# Patient Record
Sex: Female | Born: 1935 | Race: Black or African American | Hispanic: No | Marital: Married | Smoking: Former smoker
Health system: Southern US, Community
[De-identification: ages and names within clinical notes are randomized; demographics above are authoritative.]

## PROBLEM LIST (undated history)

## (undated) DIAGNOSIS — J449 Chronic obstructive pulmonary disease, unspecified: Secondary | ICD-10-CM

## (undated) DIAGNOSIS — N763 Subacute and chronic vulvitis: Secondary | ICD-10-CM

## (undated) DIAGNOSIS — D649 Anemia, unspecified: Secondary | ICD-10-CM

## (undated) DIAGNOSIS — K922 Gastrointestinal hemorrhage, unspecified: Secondary | ICD-10-CM

## (undated) DIAGNOSIS — K219 Gastro-esophageal reflux disease without esophagitis: Secondary | ICD-10-CM

## (undated) DIAGNOSIS — I4892 Unspecified atrial flutter: Principal | ICD-10-CM

## (undated) DIAGNOSIS — I2510 Atherosclerotic heart disease of native coronary artery without angina pectoris: Secondary | ICD-10-CM

## (undated) DIAGNOSIS — E119 Type 2 diabetes mellitus without complications: Secondary | ICD-10-CM

## (undated) DIAGNOSIS — M1990 Unspecified osteoarthritis, unspecified site: Secondary | ICD-10-CM

## (undated) DIAGNOSIS — E785 Hyperlipidemia, unspecified: Secondary | ICD-10-CM

## (undated) DIAGNOSIS — I340 Nonrheumatic mitral (valve) insufficiency: Secondary | ICD-10-CM

## (undated) DIAGNOSIS — I639 Cerebral infarction, unspecified: Secondary | ICD-10-CM

## (undated) DIAGNOSIS — Z5189 Encounter for other specified aftercare: Secondary | ICD-10-CM

## (undated) DIAGNOSIS — E1140 Type 2 diabetes mellitus with diabetic neuropathy, unspecified: Secondary | ICD-10-CM

## (undated) DIAGNOSIS — E039 Hypothyroidism, unspecified: Secondary | ICD-10-CM

## (undated) DIAGNOSIS — R413 Other amnesia: Secondary | ICD-10-CM

## (undated) DIAGNOSIS — I5042 Chronic combined systolic (congestive) and diastolic (congestive) heart failure: Secondary | ICD-10-CM

## (undated) DIAGNOSIS — I10 Essential (primary) hypertension: Secondary | ICD-10-CM

## (undated) DIAGNOSIS — I071 Rheumatic tricuspid insufficiency: Secondary | ICD-10-CM

## (undated) DIAGNOSIS — Z950 Presence of cardiac pacemaker: Secondary | ICD-10-CM

## (undated) DIAGNOSIS — I4891 Unspecified atrial fibrillation: Secondary | ICD-10-CM

## (undated) HISTORY — DX: Atherosclerotic heart disease of native coronary artery without angina pectoris: I25.10

## (undated) HISTORY — PX: ABDOMINAL HYSTERECTOMY: SHX81

## (undated) HISTORY — DX: Hypothyroidism, unspecified: E03.9

## (undated) HISTORY — PX: TONSILLECTOMY: SUR1361

## (undated) HISTORY — DX: Encounter for other specified aftercare: Z51.89

## (undated) HISTORY — DX: Subacute and chronic vulvitis: N76.3

## (undated) HISTORY — DX: Unspecified atrial fibrillation: I48.91

## (undated) HISTORY — DX: Hyperlipidemia, unspecified: E78.5

## (undated) HISTORY — DX: Chronic obstructive pulmonary disease, unspecified: J44.9

## (undated) HISTORY — DX: Essential (primary) hypertension: I10

## (undated) HISTORY — DX: Anemia, unspecified: D64.9

## (undated) HISTORY — DX: Type 2 diabetes mellitus without complications: E11.9

## (undated) HISTORY — PX: JOINT REPLACEMENT: SHX530

## (undated) HISTORY — DX: Cerebral infarction, unspecified: I63.9

## (undated) HISTORY — PX: BREAST BIOPSY: SHX20

## (undated) HISTORY — DX: Type 2 diabetes mellitus with diabetic neuropathy, unspecified: E11.40

## (undated) SURGERY — Surgical Case
Anesthesia: *Unknown

---

## 1977-07-04 HISTORY — PX: TOTAL ABDOMINAL HYSTERECTOMY: SHX209

## 1993-07-04 DIAGNOSIS — N763 Subacute and chronic vulvitis: Secondary | ICD-10-CM

## 1993-07-04 HISTORY — DX: Subacute and chronic vulvitis: N76.3

## 1998-02-10 ENCOUNTER — Ambulatory Visit (HOSPITAL_COMMUNITY): Admission: RE | Admit: 1998-02-10 | Discharge: 1998-02-10 | Payer: Self-pay | Admitting: Gastroenterology

## 1998-10-01 ENCOUNTER — Ambulatory Visit (HOSPITAL_COMMUNITY): Admission: RE | Admit: 1998-10-01 | Discharge: 1998-10-01 | Payer: Self-pay | Admitting: Cardiology

## 1998-10-01 ENCOUNTER — Encounter: Payer: Self-pay | Admitting: Cardiology

## 2000-09-11 ENCOUNTER — Other Ambulatory Visit: Admission: RE | Admit: 2000-09-11 | Discharge: 2000-09-11 | Payer: Self-pay | Admitting: *Deleted

## 2000-10-02 HISTORY — PX: BILATERAL SALPINGECTOMY: SHX5743

## 2000-10-06 ENCOUNTER — Encounter: Payer: Self-pay | Admitting: Obstetrics and Gynecology

## 2000-10-10 ENCOUNTER — Inpatient Hospital Stay (HOSPITAL_COMMUNITY): Admission: RE | Admit: 2000-10-10 | Discharge: 2000-10-12 | Payer: Self-pay | Admitting: Obstetrics and Gynecology

## 2000-10-10 ENCOUNTER — Encounter (INDEPENDENT_AMBULATORY_CARE_PROVIDER_SITE_OTHER): Payer: Self-pay

## 2001-07-20 ENCOUNTER — Encounter: Admission: RE | Admit: 2001-07-20 | Discharge: 2001-07-20 | Payer: Self-pay | Admitting: Orthopedic Surgery

## 2001-07-20 ENCOUNTER — Encounter: Payer: Self-pay | Admitting: Orthopedic Surgery

## 2001-08-04 HISTORY — PX: KNEE ARTHROSCOPY: SUR90

## 2001-08-10 ENCOUNTER — Ambulatory Visit (HOSPITAL_BASED_OUTPATIENT_CLINIC_OR_DEPARTMENT_OTHER): Admission: RE | Admit: 2001-08-10 | Discharge: 2001-08-10 | Payer: Self-pay | Admitting: Orthopedic Surgery

## 2002-01-09 ENCOUNTER — Ambulatory Visit (HOSPITAL_COMMUNITY): Admission: RE | Admit: 2002-01-09 | Discharge: 2002-01-09 | Payer: Self-pay | Admitting: Orthopedic Surgery

## 2002-07-04 DIAGNOSIS — I639 Cerebral infarction, unspecified: Secondary | ICD-10-CM

## 2002-07-04 HISTORY — DX: Cerebral infarction, unspecified: I63.9

## 2003-02-25 ENCOUNTER — Ambulatory Visit (HOSPITAL_COMMUNITY): Admission: RE | Admit: 2003-02-25 | Discharge: 2003-02-25 | Payer: Self-pay | Admitting: Gastroenterology

## 2003-04-09 ENCOUNTER — Encounter: Payer: Self-pay | Admitting: Emergency Medicine

## 2003-04-09 ENCOUNTER — Inpatient Hospital Stay (HOSPITAL_COMMUNITY): Admission: EM | Admit: 2003-04-09 | Discharge: 2003-04-11 | Payer: Self-pay | Admitting: Emergency Medicine

## 2003-04-10 ENCOUNTER — Encounter: Payer: Self-pay | Admitting: Internal Medicine

## 2003-04-30 ENCOUNTER — Encounter: Admission: RE | Admit: 2003-04-30 | Discharge: 2003-06-02 | Payer: Self-pay | Admitting: Internal Medicine

## 2003-12-03 ENCOUNTER — Inpatient Hospital Stay (HOSPITAL_COMMUNITY): Admission: RE | Admit: 2003-12-03 | Discharge: 2003-12-09 | Payer: Self-pay | Admitting: Orthopedic Surgery

## 2003-12-09 ENCOUNTER — Inpatient Hospital Stay (HOSPITAL_COMMUNITY)
Admission: RE | Admit: 2003-12-09 | Discharge: 2003-12-15 | Payer: Self-pay | Admitting: Physical Medicine & Rehabilitation

## 2004-05-20 ENCOUNTER — Ambulatory Visit: Payer: Self-pay | Admitting: Oncology

## 2004-07-04 DIAGNOSIS — D649 Anemia, unspecified: Secondary | ICD-10-CM

## 2004-07-04 HISTORY — DX: Anemia, unspecified: D64.9

## 2004-07-09 ENCOUNTER — Ambulatory Visit (HOSPITAL_COMMUNITY): Admission: RE | Admit: 2004-07-09 | Discharge: 2004-07-09 | Payer: Self-pay | Admitting: Oncology

## 2004-07-16 ENCOUNTER — Ambulatory Visit: Payer: Self-pay | Admitting: Oncology

## 2004-09-09 ENCOUNTER — Ambulatory Visit: Payer: Self-pay | Admitting: Oncology

## 2004-10-12 ENCOUNTER — Encounter: Admission: RE | Admit: 2004-10-12 | Discharge: 2004-10-12 | Payer: Self-pay | Admitting: Neurosurgery

## 2004-11-01 ENCOUNTER — Encounter: Admission: RE | Admit: 2004-11-01 | Discharge: 2004-11-01 | Payer: Self-pay | Admitting: Neurosurgery

## 2004-11-03 ENCOUNTER — Ambulatory Visit: Payer: Self-pay | Admitting: Oncology

## 2004-11-16 ENCOUNTER — Encounter: Admission: RE | Admit: 2004-11-16 | Discharge: 2004-11-16 | Payer: Self-pay | Admitting: Neurosurgery

## 2004-12-30 ENCOUNTER — Ambulatory Visit: Payer: Self-pay | Admitting: Oncology

## 2005-01-10 ENCOUNTER — Encounter: Admission: RE | Admit: 2005-01-10 | Discharge: 2005-01-10 | Payer: Self-pay | Admitting: Neurosurgery

## 2005-02-23 ENCOUNTER — Ambulatory Visit: Payer: Self-pay | Admitting: Oncology

## 2005-04-13 ENCOUNTER — Ambulatory Visit: Payer: Self-pay | Admitting: Oncology

## 2005-05-30 ENCOUNTER — Ambulatory Visit: Payer: Self-pay | Admitting: Oncology

## 2005-07-19 ENCOUNTER — Ambulatory Visit: Payer: Self-pay | Admitting: Oncology

## 2005-09-13 ENCOUNTER — Ambulatory Visit: Payer: Self-pay | Admitting: Oncology

## 2005-10-12 LAB — CBC WITH DIFFERENTIAL/PLATELET
BASO%: 0.6 % (ref 0.0–2.0)
Basophils Absolute: 0 10*3/uL (ref 0.0–0.1)
Eosinophils Absolute: 0.2 10*3/uL (ref 0.0–0.5)
HCT: 36.2 % (ref 34.8–46.6)
HGB: 11.7 g/dL (ref 11.6–15.9)
MONO#: 0.6 10*3/uL (ref 0.1–0.9)
NEUT#: 4.7 10*3/uL (ref 1.5–6.5)
NEUT%: 61 % (ref 39.6–76.8)
Platelets: 331 10*3/uL (ref 145–400)
WBC: 7.7 10*3/uL (ref 3.9–10.0)
lymph#: 2.1 10*3/uL (ref 0.9–3.3)

## 2005-10-28 LAB — CBC WITH DIFFERENTIAL/PLATELET
Basophils Absolute: 0 10*3/uL (ref 0.0–0.1)
EOS%: 2.8 % (ref 0.0–7.0)
HCT: 36.8 % (ref 34.8–46.6)
HGB: 12.1 g/dL (ref 11.6–15.9)
LYMPH%: 24.4 % (ref 14.0–48.0)
MCH: 30.1 pg (ref 26.0–34.0)
MCV: 92 fL (ref 81.0–101.0)
NEUT%: 61.3 % (ref 39.6–76.8)
Platelets: 313 10*3/uL (ref 145–400)
lymph#: 1.8 10*3/uL (ref 0.9–3.3)

## 2005-11-08 ENCOUNTER — Ambulatory Visit: Payer: Self-pay | Admitting: Oncology

## 2005-11-10 LAB — COMPREHENSIVE METABOLIC PANEL
ALT: 15 U/L (ref 0–40)
AST: 19 U/L (ref 0–37)
BUN: 18 mg/dL (ref 6–23)
CO2: 23 mEq/L (ref 19–32)
Creatinine, Ser: 1.1 mg/dL (ref 0.4–1.2)
Total Bilirubin: 0.3 mg/dL (ref 0.3–1.2)

## 2005-11-10 LAB — LACTATE DEHYDROGENASE: LDH: 158 U/L (ref 94–250)

## 2005-11-10 LAB — CBC WITH DIFFERENTIAL/PLATELET
BASO%: 0.5 % (ref 0.0–2.0)
Basophils Absolute: 0 10*3/uL (ref 0.0–0.1)
EOS%: 2.7 % (ref 0.0–7.0)
HCT: 35 % (ref 34.8–46.6)
HGB: 11.4 g/dL — ABNORMAL LOW (ref 11.6–15.9)
LYMPH%: 26.5 % (ref 14.0–48.0)
MCH: 29.9 pg (ref 26.0–34.0)
MCHC: 32.8 g/dL (ref 32.0–36.0)
NEUT%: 61.8 % (ref 39.6–76.8)
Platelets: 323 10*3/uL (ref 145–400)

## 2005-11-10 LAB — IGG, IGA, IGM
IgA: 192 mg/dL (ref 68–378)
IgM, Serum: 112 mg/dL (ref 60–263)

## 2005-11-24 LAB — CBC WITH DIFFERENTIAL/PLATELET
BASO%: 0.4 % (ref 0.0–2.0)
Eosinophils Absolute: 0.1 10*3/uL (ref 0.0–0.5)
MCHC: 32.9 g/dL (ref 32.0–36.0)
MONO#: 0.6 10*3/uL (ref 0.1–0.9)
NEUT#: 3.6 10*3/uL (ref 1.5–6.5)
RBC: 3.95 10*6/uL (ref 3.70–5.32)
WBC: 6.3 10*3/uL (ref 3.9–10.0)
lymph#: 1.9 10*3/uL (ref 0.9–3.3)

## 2005-12-22 ENCOUNTER — Ambulatory Visit: Payer: Self-pay | Admitting: Oncology

## 2005-12-22 LAB — CBC WITH DIFFERENTIAL/PLATELET
Basophils Absolute: 0 10*3/uL (ref 0.0–0.1)
Eosinophils Absolute: 0.1 10*3/uL (ref 0.0–0.5)
HCT: 33.1 % — ABNORMAL LOW (ref 34.8–46.6)
HGB: 10.9 g/dL — ABNORMAL LOW (ref 11.6–15.9)
LYMPH%: 33.3 % (ref 14.0–48.0)
MONO#: 0.6 10*3/uL (ref 0.1–0.9)
NEUT#: 3.9 10*3/uL (ref 1.5–6.5)
NEUT%: 55 % (ref 39.6–76.8)
Platelets: 299 10*3/uL (ref 145–400)
WBC: 7 10*3/uL (ref 3.9–10.0)

## 2006-01-05 LAB — CBC WITH DIFFERENTIAL/PLATELET
Basophils Absolute: 0 10*3/uL (ref 0.0–0.1)
Eosinophils Absolute: 0.2 10*3/uL (ref 0.0–0.5)
HCT: 32.4 % — ABNORMAL LOW (ref 34.8–46.6)
HGB: 10.6 g/dL — ABNORMAL LOW (ref 11.6–15.9)
LYMPH%: 35.7 % (ref 14.0–48.0)
MCHC: 32.8 g/dL (ref 32.0–36.0)
MONO#: 0.7 10*3/uL (ref 0.1–0.9)
NEUT#: 3.3 10*3/uL (ref 1.5–6.5)
NEUT%: 50.8 % (ref 39.6–76.8)
Platelets: 273 10*3/uL (ref 145–400)
WBC: 6.5 10*3/uL (ref 3.9–10.0)
lymph#: 2.3 10*3/uL (ref 0.9–3.3)

## 2006-01-19 LAB — CBC WITH DIFFERENTIAL/PLATELET
Eosinophils Absolute: 0.2 10*3/uL (ref 0.0–0.5)
LYMPH%: 30.3 % (ref 14.0–48.0)
MONO#: 0.6 10*3/uL (ref 0.1–0.9)
NEUT#: 3.5 10*3/uL (ref 1.5–6.5)
Platelets: 346 10*3/uL (ref 145–400)
RBC: 3.72 10*6/uL (ref 3.70–5.32)
WBC: 6.3 10*3/uL (ref 3.9–10.0)
lymph#: 1.9 10*3/uL (ref 0.9–3.3)

## 2006-02-01 ENCOUNTER — Ambulatory Visit: Payer: Self-pay | Admitting: Oncology

## 2006-02-02 LAB — CBC WITH DIFFERENTIAL/PLATELET
Basophils Absolute: 0 10*3/uL (ref 0.0–0.1)
Eosinophils Absolute: 0.1 10*3/uL (ref 0.0–0.5)
HGB: 11.5 g/dL — ABNORMAL LOW (ref 11.6–15.9)
MCV: 93.4 fL (ref 81.0–101.0)
MONO%: 11 % (ref 0.0–13.0)
NEUT#: 4.1 10*3/uL (ref 1.5–6.5)
RDW: 16.4 % — ABNORMAL HIGH (ref 11.3–14.5)

## 2006-02-10 ENCOUNTER — Other Ambulatory Visit: Admission: RE | Admit: 2006-02-10 | Discharge: 2006-02-10 | Payer: Self-pay | Admitting: Obstetrics & Gynecology

## 2006-02-16 LAB — CBC WITH DIFFERENTIAL/PLATELET
Basophils Absolute: 0 10*3/uL (ref 0.0–0.1)
Eosinophils Absolute: 0.2 10*3/uL (ref 0.0–0.5)
HGB: 11.8 g/dL (ref 11.6–15.9)
LYMPH%: 29.2 % (ref 14.0–48.0)
MCV: 93.3 fL (ref 81.0–101.0)
MONO%: 10.7 % (ref 0.0–13.0)
NEUT#: 3.6 10*3/uL (ref 1.5–6.5)
NEUT%: 57 % (ref 39.6–76.8)
Platelets: 328 10*3/uL (ref 145–400)
RBC: 3.85 10*6/uL (ref 3.70–5.32)

## 2006-03-02 LAB — CBC WITH DIFFERENTIAL/PLATELET
BASO%: 0.5 % (ref 0.0–2.0)
LYMPH%: 41 % (ref 14.0–48.0)
MCHC: 32.9 g/dL (ref 32.0–36.0)
MCV: 94.3 fL (ref 81.0–101.0)
MONO%: 9.1 % (ref 0.0–13.0)
Platelets: 270 10*3/uL (ref 145–400)
RBC: 3.66 10*6/uL — ABNORMAL LOW (ref 3.70–5.32)

## 2006-03-21 ENCOUNTER — Ambulatory Visit: Payer: Self-pay | Admitting: Oncology

## 2006-03-23 LAB — CBC WITH DIFFERENTIAL/PLATELET
BASO%: 0.5 % (ref 0.0–2.0)
EOS%: 1.8 % (ref 0.0–7.0)
LYMPH%: 28.1 % (ref 14.0–48.0)
MCHC: 32.9 g/dL (ref 32.0–36.0)
MONO#: 0.6 10*3/uL (ref 0.1–0.9)
Platelets: 278 10*3/uL (ref 145–400)
RBC: 3.63 10*6/uL — ABNORMAL LOW (ref 3.70–5.32)
WBC: 6.6 10*3/uL (ref 3.9–10.0)
lymph#: 1.8 10*3/uL (ref 0.9–3.3)

## 2006-03-23 LAB — COMPREHENSIVE METABOLIC PANEL
ALT: 11 U/L (ref 0–40)
AST: 15 U/L (ref 0–37)
CO2: 23 mEq/L (ref 19–32)
Sodium: 139 mEq/L (ref 135–145)
Total Bilirubin: 0.4 mg/dL (ref 0.3–1.2)
Total Protein: 7.5 g/dL (ref 6.0–8.3)

## 2006-03-23 LAB — LACTATE DEHYDROGENASE: LDH: 146 U/L (ref 94–250)

## 2006-05-02 ENCOUNTER — Ambulatory Visit: Payer: Self-pay | Admitting: Oncology

## 2006-05-04 LAB — CBC WITH DIFFERENTIAL/PLATELET
BASO%: 0.6 % (ref 0.0–2.0)
EOS%: 2.5 % (ref 0.0–7.0)
HCT: 32.5 % — ABNORMAL LOW (ref 34.8–46.6)
LYMPH%: 31.1 % (ref 14.0–48.0)
MCH: 30.8 pg (ref 26.0–34.0)
MCHC: 33.1 g/dL (ref 32.0–36.0)
NEUT%: 57.4 % (ref 39.6–76.8)
Platelets: 303 10*3/uL (ref 145–400)
RBC: 3.51 10*6/uL — ABNORMAL LOW (ref 3.70–5.32)
lymph#: 2.6 10*3/uL (ref 0.9–3.3)

## 2006-05-18 LAB — CBC WITH DIFFERENTIAL/PLATELET
BASO%: 0.4 % (ref 0.0–2.0)
EOS%: 2.7 % (ref 0.0–7.0)
MCH: 31.1 pg (ref 26.0–34.0)
MCHC: 33.1 g/dL (ref 32.0–36.0)
RDW: 14.7 % — ABNORMAL HIGH (ref 11.3–14.5)
WBC: 6.9 10*3/uL (ref 3.9–10.0)
lymph#: 2.2 10*3/uL (ref 0.9–3.3)

## 2006-06-01 ENCOUNTER — Ambulatory Visit: Payer: Self-pay | Admitting: Oncology

## 2006-06-01 LAB — CBC WITH DIFFERENTIAL/PLATELET
Basophils Absolute: 0.1 10*3/uL (ref 0.0–0.1)
EOS%: 3.7 % (ref 0.0–7.0)
Eosinophils Absolute: 0.3 10*3/uL (ref 0.0–0.5)
HGB: 11.9 g/dL (ref 11.6–15.9)
MONO#: 0.7 10*3/uL (ref 0.1–0.9)
NEUT#: 3.8 10*3/uL (ref 1.5–6.5)
RDW: 14.1 % (ref 11.3–14.5)
WBC: 7.2 10*3/uL (ref 3.9–10.0)
lymph#: 2.3 10*3/uL (ref 0.9–3.3)

## 2006-06-15 LAB — CBC WITH DIFFERENTIAL/PLATELET
BASO%: 0.5 % (ref 0.0–2.0)
Eosinophils Absolute: 0.2 10*3/uL (ref 0.0–0.5)
HCT: 33.7 % — ABNORMAL LOW (ref 34.8–46.6)
LYMPH%: 34.1 % (ref 14.0–48.0)
MCHC: 33 g/dL (ref 32.0–36.0)
MONO#: 0.7 10*3/uL (ref 0.1–0.9)
NEUT#: 3.8 10*3/uL (ref 1.5–6.5)
Platelets: 264 10*3/uL (ref 145–400)
RBC: 3.64 10*6/uL — ABNORMAL LOW (ref 3.70–5.32)
WBC: 7.2 10*3/uL (ref 3.9–10.0)
lymph#: 2.4 10*3/uL (ref 0.9–3.3)

## 2006-06-29 LAB — CBC WITH DIFFERENTIAL/PLATELET
BASO%: 0.4 % (ref 0.0–2.0)
Basophils Absolute: 0 10*3/uL (ref 0.0–0.1)
Eosinophils Absolute: 0.3 10*3/uL (ref 0.0–0.5)
HCT: 33.5 % — ABNORMAL LOW (ref 34.8–46.6)
HGB: 10.9 g/dL — ABNORMAL LOW (ref 11.6–15.9)
LYMPH%: 29.8 % (ref 14.0–48.0)
MONO#: 0.7 10*3/uL (ref 0.1–0.9)
NEUT%: 56.6 % (ref 39.6–76.8)
Platelets: 289 10*3/uL (ref 145–400)
WBC: 7.4 10*3/uL (ref 3.9–10.0)
lymph#: 2.2 10*3/uL (ref 0.9–3.3)

## 2006-07-11 ENCOUNTER — Ambulatory Visit: Payer: Self-pay | Admitting: Oncology

## 2006-07-19 LAB — CBC WITH DIFFERENTIAL/PLATELET
Basophils Absolute: 0 10*3/uL (ref 0.0–0.1)
EOS%: 2.9 % (ref 0.0–7.0)
HCT: 34.1 % — ABNORMAL LOW (ref 34.8–46.6)
HGB: 11.2 g/dL — ABNORMAL LOW (ref 11.6–15.9)
MCH: 30.4 pg (ref 26.0–34.0)
MCV: 92.2 fL (ref 81.0–101.0)
MONO%: 9.7 % (ref 0.0–13.0)
NEUT%: 58.2 % (ref 39.6–76.8)
RDW: 14.7 % — ABNORMAL HIGH (ref 11.3–14.5)

## 2006-07-19 LAB — COMPREHENSIVE METABOLIC PANEL
AST: 15 U/L (ref 0–37)
Alkaline Phosphatase: 76 U/L (ref 39–117)
BUN: 22 mg/dL (ref 6–23)
Creatinine, Ser: 1.15 mg/dL (ref 0.40–1.20)

## 2006-07-19 LAB — IRON AND TIBC
Iron: 62 ug/dL (ref 42–145)
TIBC: 304 ug/dL (ref 250–470)
UIBC: 242 ug/dL

## 2006-07-19 LAB — FERRITIN: Ferritin: 53 ng/mL (ref 10–291)

## 2006-08-03 LAB — CBC WITH DIFFERENTIAL/PLATELET
BASO%: 1.7 % (ref 0.0–2.0)
Eosinophils Absolute: 0.2 10*3/uL (ref 0.0–0.5)
HCT: 36.2 % (ref 34.8–46.6)
LYMPH%: 30.7 % (ref 14.0–48.0)
MCHC: 32.9 g/dL (ref 32.0–36.0)
MONO#: 0.7 10*3/uL (ref 0.1–0.9)
NEUT%: 55.7 % (ref 39.6–76.8)
Platelets: 282 10*3/uL (ref 145–400)
WBC: 7.2 10*3/uL (ref 3.9–10.0)

## 2006-08-17 LAB — CBC WITH DIFFERENTIAL/PLATELET
BASO%: 1.3 % (ref 0.0–2.0)
Basophils Absolute: 0.1 10*3/uL (ref 0.0–0.1)
EOS%: 2.5 % (ref 0.0–7.0)
Eosinophils Absolute: 0.2 10*3/uL (ref 0.0–0.5)
HCT: 33.1 % — ABNORMAL LOW (ref 34.8–46.6)
HGB: 11.2 g/dL — ABNORMAL LOW (ref 11.6–15.9)
LYMPH%: 24.4 % (ref 14.0–48.0)
MCH: 30.7 pg (ref 26.0–34.0)
MCHC: 33.8 g/dL (ref 32.0–36.0)
MCV: 90.9 fL (ref 81.0–101.0)
MONO#: 0.9 10*3/uL (ref 0.1–0.9)
MONO%: 10.1 % (ref 0.0–13.0)
NEUT#: 5.2 10*3/uL (ref 1.5–6.5)
NEUT%: 61.7 % (ref 39.6–76.8)
Platelets: 309 10*3/uL (ref 145–400)
RBC: 3.64 10*6/uL — ABNORMAL LOW (ref 3.70–5.32)
RDW: 14.6 % — ABNORMAL HIGH (ref 11.3–14.5)
WBC: 8.4 10*3/uL (ref 3.9–10.0)
lymph#: 2.1 10*3/uL (ref 0.9–3.3)

## 2006-08-28 ENCOUNTER — Ambulatory Visit: Payer: Self-pay | Admitting: Oncology

## 2006-08-31 LAB — CBC WITH DIFFERENTIAL/PLATELET
BASO%: 0.6 % (ref 0.0–2.0)
Basophils Absolute: 0 10*3/uL (ref 0.0–0.1)
EOS%: 2.4 % (ref 0.0–7.0)
HCT: 34.1 % — ABNORMAL LOW (ref 34.8–46.6)
HGB: 11.3 g/dL — ABNORMAL LOW (ref 11.6–15.9)
LYMPH%: 27.4 % (ref 14.0–48.0)
MCH: 30 pg (ref 26.0–34.0)
MCHC: 33.1 g/dL (ref 32.0–36.0)
MCV: 90.6 fL (ref 81.0–101.0)
MONO%: 10 % (ref 0.0–13.0)
NEUT%: 59.6 % (ref 39.6–76.8)
lymph#: 1.9 10*3/uL (ref 0.9–3.3)

## 2006-09-14 LAB — CBC WITH DIFFERENTIAL/PLATELET
BASO%: 0.4 % (ref 0.0–2.0)
Basophils Absolute: 0 10*3/uL (ref 0.0–0.1)
EOS%: 2.9 % (ref 0.0–7.0)
MCH: 30.5 pg (ref 26.0–34.0)
MCHC: 33.5 g/dL (ref 32.0–36.0)
MCV: 91 fL (ref 81.0–101.0)
MONO%: 10 % (ref 0.0–13.0)
RBC: 3.93 10*6/uL (ref 3.70–5.32)
RDW: 16.5 % — ABNORMAL HIGH (ref 11.3–14.5)
lymph#: 1.8 10*3/uL (ref 0.9–3.3)

## 2006-09-28 LAB — CBC WITH DIFFERENTIAL/PLATELET
EOS%: 2.8 % (ref 0.0–7.0)
Eosinophils Absolute: 0.2 10*3/uL (ref 0.0–0.5)
HGB: 12.3 g/dL (ref 11.6–15.9)
MCV: 90 fL (ref 81.0–101.0)
MONO%: 10.3 % (ref 0.0–13.0)
NEUT#: 3.6 10*3/uL (ref 1.5–6.5)
RBC: 4.05 10*6/uL (ref 3.70–5.32)
RDW: 15.7 % — ABNORMAL HIGH (ref 11.3–14.5)
WBC: 6.5 10*3/uL (ref 3.9–10.0)
lymph#: 2 10*3/uL (ref 0.9–3.3)

## 2006-10-10 ENCOUNTER — Ambulatory Visit: Payer: Self-pay | Admitting: Oncology

## 2006-10-12 LAB — CBC WITH DIFFERENTIAL/PLATELET
Basophils Absolute: 0.1 10*3/uL (ref 0.0–0.1)
Eosinophils Absolute: 0.2 10*3/uL (ref 0.0–0.5)
HGB: 12 g/dL (ref 11.6–15.9)
MONO#: 0.7 10*3/uL (ref 0.1–0.9)
NEUT#: 4.2 10*3/uL (ref 1.5–6.5)
Platelets: 280 10*3/uL (ref 145–400)
RBC: 3.97 10*6/uL (ref 3.70–5.32)
RDW: 15.2 % — ABNORMAL HIGH (ref 11.3–14.5)
WBC: 7.1 10*3/uL (ref 3.9–10.0)

## 2006-10-26 LAB — CBC WITH DIFFERENTIAL/PLATELET
Basophils Absolute: 0 10*3/uL (ref 0.0–0.1)
Eosinophils Absolute: 0.2 10*3/uL (ref 0.0–0.5)
HCT: 36.8 % (ref 34.8–46.6)
LYMPH%: 24 % (ref 14.0–48.0)
MCV: 89.2 fL (ref 81.0–101.0)
MONO#: 0.8 10*3/uL (ref 0.1–0.9)
NEUT#: 4.1 10*3/uL (ref 1.5–6.5)
NEUT%: 60.7 % (ref 39.6–76.8)
Platelets: 268 10*3/uL (ref 145–400)
WBC: 6.8 10*3/uL (ref 3.9–10.0)

## 2006-11-09 LAB — CBC WITH DIFFERENTIAL/PLATELET
Basophils Absolute: 0 10*3/uL (ref 0.0–0.1)
EOS%: 2.7 % (ref 0.0–7.0)
Eosinophils Absolute: 0.2 10*3/uL (ref 0.0–0.5)
HGB: 12 g/dL (ref 11.6–15.9)
MONO%: 9.4 % (ref 0.0–13.0)
NEUT#: 4.1 10*3/uL (ref 1.5–6.5)
RBC: 4.01 10*6/uL (ref 3.70–5.32)
RDW: 15.3 % — ABNORMAL HIGH (ref 11.3–14.5)
lymph#: 2.1 10*3/uL (ref 0.9–3.3)

## 2006-11-09 LAB — COMPREHENSIVE METABOLIC PANEL
AST: 23 U/L (ref 0–37)
Albumin: 4.2 g/dL (ref 3.5–5.2)
Alkaline Phosphatase: 73 U/L (ref 39–117)
BUN: 21 mg/dL (ref 6–23)
Calcium: 9.1 mg/dL (ref 8.4–10.5)
Chloride: 106 mEq/L (ref 96–112)
Glucose, Bld: 73 mg/dL (ref 70–99)
Potassium: 4.7 mEq/L (ref 3.5–5.3)
Sodium: 141 mEq/L (ref 135–145)
Total Protein: 7.5 g/dL (ref 6.0–8.3)

## 2006-11-09 LAB — IGG, IGA, IGM
IgG (Immunoglobin G), Serum: 1510 mg/dL (ref 694–1618)
IgM, Serum: 95 mg/dL (ref 60–263)

## 2006-11-09 LAB — IRON AND TIBC: UIBC: 264 ug/dL

## 2006-11-09 LAB — FERRITIN: Ferritin: 68 ng/mL (ref 10–291)

## 2006-11-21 ENCOUNTER — Ambulatory Visit: Payer: Self-pay | Admitting: Oncology

## 2006-11-23 LAB — CBC WITH DIFFERENTIAL/PLATELET
BASO%: 0.3 % (ref 0.0–2.0)
Eosinophils Absolute: 0.2 10*3/uL (ref 0.0–0.5)
LYMPH%: 25.5 % (ref 14.0–48.0)
MCHC: 33.7 g/dL (ref 32.0–36.0)
MONO#: 0.7 10*3/uL (ref 0.1–0.9)
NEUT#: 4.2 10*3/uL (ref 1.5–6.5)
RBC: 3.91 10*6/uL (ref 3.70–5.32)
RDW: 16.6 % — ABNORMAL HIGH (ref 11.3–14.5)
WBC: 6.8 10*3/uL (ref 3.9–10.0)
lymph#: 1.7 10*3/uL (ref 0.9–3.3)

## 2006-12-07 LAB — CBC WITH DIFFERENTIAL/PLATELET
BASO%: 0.7 % (ref 0.0–2.0)
Eosinophils Absolute: 0.2 10*3/uL (ref 0.0–0.5)
HCT: 35.7 % (ref 34.8–46.6)
HGB: 12.1 g/dL (ref 11.6–15.9)
MCHC: 34 g/dL (ref 32.0–36.0)
MONO#: 0.8 10*3/uL (ref 0.1–0.9)
NEUT#: 4.3 10*3/uL (ref 1.5–6.5)
NEUT%: 58.8 % (ref 39.6–76.8)
Platelets: 260 10*3/uL (ref 145–400)
WBC: 7.4 10*3/uL (ref 3.9–10.0)
lymph#: 2 10*3/uL (ref 0.9–3.3)

## 2006-12-21 LAB — CBC WITH DIFFERENTIAL/PLATELET
Basophils Absolute: 0 10*3/uL (ref 0.0–0.1)
EOS%: 3.2 % (ref 0.0–7.0)
HCT: 35 % (ref 34.8–46.6)
HGB: 11.8 g/dL (ref 11.6–15.9)
LYMPH%: 29 % (ref 14.0–48.0)
MCH: 30.5 pg (ref 26.0–34.0)
MONO#: 0.8 10*3/uL (ref 0.1–0.9)
NEUT%: 56.2 % (ref 39.6–76.8)
Platelets: 287 10*3/uL (ref 145–400)
lymph#: 1.9 10*3/uL (ref 0.9–3.3)

## 2007-01-16 ENCOUNTER — Ambulatory Visit: Payer: Self-pay | Admitting: Oncology

## 2007-01-18 LAB — CBC WITH DIFFERENTIAL/PLATELET
Basophils Absolute: 0 10*3/uL (ref 0.0–0.1)
Eosinophils Absolute: 0.2 10*3/uL (ref 0.0–0.5)
HGB: 11.7 g/dL (ref 11.6–15.9)
LYMPH%: 31.6 % (ref 14.0–48.0)
MCV: 88.9 fL (ref 81.0–101.0)
MONO%: 8.9 % (ref 0.0–13.0)
NEUT#: 4.4 10*3/uL (ref 1.5–6.5)
Platelets: 321 10*3/uL (ref 145–400)

## 2007-02-15 LAB — CBC WITH DIFFERENTIAL/PLATELET
Eosinophils Absolute: 0.1 10*3/uL (ref 0.0–0.5)
MONO#: 0.7 10*3/uL (ref 0.1–0.9)
NEUT#: 3.6 10*3/uL (ref 1.5–6.5)
Platelets: 321 10*3/uL (ref 145–400)
RBC: 3.88 10*6/uL (ref 3.70–5.32)
RDW: 16.3 % — ABNORMAL HIGH (ref 11.3–14.5)
WBC: 6.6 10*3/uL (ref 3.9–10.0)

## 2007-03-01 LAB — CBC WITH DIFFERENTIAL/PLATELET
Eosinophils Absolute: 0.2 10*3/uL (ref 0.0–0.5)
HCT: 34.8 % (ref 34.8–46.6)
LYMPH%: 30.1 % (ref 14.0–48.0)
MONO#: 0.7 10*3/uL (ref 0.1–0.9)
NEUT#: 4.3 10*3/uL (ref 1.5–6.5)
NEUT%: 57.3 % (ref 39.6–76.8)
Platelets: 295 10*3/uL (ref 145–400)
WBC: 7.5 10*3/uL (ref 3.9–10.0)

## 2007-03-02 ENCOUNTER — Ambulatory Visit: Payer: Self-pay | Admitting: Oncology

## 2007-03-08 LAB — COMPREHENSIVE METABOLIC PANEL
ALT: 15 U/L (ref 0–35)
BUN: 20 mg/dL (ref 6–23)
CO2: 23 mEq/L (ref 19–32)
Creatinine, Ser: 1.09 mg/dL (ref 0.40–1.20)
Total Bilirubin: 0.3 mg/dL (ref 0.3–1.2)

## 2007-03-08 LAB — IRON AND TIBC: %SAT: 20 % (ref 20–55)

## 2007-03-08 LAB — IGG, IGA, IGM
IgA: 166 mg/dL (ref 68–378)
IgM, Serum: 91 mg/dL (ref 60–263)

## 2007-03-08 LAB — CBC WITH DIFFERENTIAL/PLATELET
BASO%: 0.6 % (ref 0.0–2.0)
HCT: 33.8 % — ABNORMAL LOW (ref 34.8–46.6)
LYMPH%: 32.6 % (ref 14.0–48.0)
MCH: 30.4 pg (ref 26.0–34.0)
MCHC: 34 g/dL (ref 32.0–36.0)
MONO#: 0.7 10*3/uL (ref 0.1–0.9)
NEUT%: 54 % (ref 39.6–76.8)
Platelets: 303 10*3/uL (ref 145–400)

## 2007-03-08 LAB — VITAMIN B12: Vitamin B-12: 218 pg/mL (ref 211–911)

## 2007-03-08 LAB — FERRITIN: Ferritin: 30 ng/mL (ref 10–291)

## 2007-03-08 LAB — LACTATE DEHYDROGENASE: LDH: 182 U/L (ref 94–250)

## 2007-03-15 LAB — CBC WITH DIFFERENTIAL/PLATELET
BASO%: 0.8 % (ref 0.0–2.0)
EOS%: 3.2 % (ref 0.0–7.0)
MCH: 30.3 pg (ref 26.0–34.0)
MCV: 89.6 fL (ref 81.0–101.0)
MONO%: 10.6 % (ref 0.0–13.0)
RBC: 3.69 10*6/uL — ABNORMAL LOW (ref 3.70–5.32)
RDW: 15.9 % — ABNORMAL HIGH (ref 11.3–14.5)

## 2007-03-29 LAB — CBC WITH DIFFERENTIAL/PLATELET
Eosinophils Absolute: 0.2 10*3/uL (ref 0.0–0.5)
MCV: 89 fL (ref 81.0–101.0)
MONO#: 0.7 10*3/uL (ref 0.1–0.9)
MONO%: 8.7 % (ref 0.0–13.0)
NEUT#: 4.9 10*3/uL (ref 1.5–6.5)
RBC: 3.73 10*6/uL (ref 3.70–5.32)
RDW: 16.3 % — ABNORMAL HIGH (ref 11.3–14.5)
WBC: 8 10*3/uL (ref 3.9–10.0)
lymph#: 2.3 10*3/uL (ref 0.9–3.3)

## 2007-04-12 LAB — CBC WITH DIFFERENTIAL/PLATELET
Basophils Absolute: 0 10*3/uL (ref 0.0–0.1)
Eosinophils Absolute: 0.2 10*3/uL (ref 0.0–0.5)
LYMPH%: 30 % (ref 14.0–48.0)
MCH: 30.4 pg (ref 26.0–34.0)
MCV: 89.6 fL (ref 81.0–101.0)
MONO%: 12.2 % (ref 0.0–13.0)
NEUT#: 3.9 10*3/uL (ref 1.5–6.5)
Platelets: 282 10*3/uL (ref 145–400)
RBC: 3.72 10*6/uL (ref 3.70–5.32)

## 2007-04-24 ENCOUNTER — Ambulatory Visit: Payer: Self-pay | Admitting: Oncology

## 2007-04-26 LAB — CBC WITH DIFFERENTIAL/PLATELET
Basophils Absolute: 0.1 10*3/uL (ref 0.0–0.1)
Eosinophils Absolute: 0.2 10*3/uL (ref 0.0–0.5)
HCT: 34.8 % (ref 34.8–46.6)
HGB: 11.7 g/dL (ref 11.6–15.9)
MCV: 89.8 fL (ref 81.0–101.0)
MONO%: 9.3 % (ref 0.0–13.0)
NEUT#: 3.4 10*3/uL (ref 1.5–6.5)
NEUT%: 51.8 % (ref 39.6–76.8)
Platelets: 282 10*3/uL (ref 145–400)
RDW: 16.4 % — ABNORMAL HIGH (ref 11.3–14.5)

## 2007-05-10 LAB — CBC WITH DIFFERENTIAL/PLATELET
BASO%: 0.7 % (ref 0.0–2.0)
EOS%: 2.1 % (ref 0.0–7.0)
LYMPH%: 34.4 % (ref 14.0–48.0)
MCHC: 33.5 g/dL (ref 32.0–36.0)
MCV: 90.1 fL (ref 81.0–101.0)
MONO%: 10.7 % (ref 0.0–13.0)
Platelets: 281 10*3/uL (ref 145–400)
RBC: 3.8 10*6/uL (ref 3.70–5.32)
WBC: 5.7 10*3/uL (ref 3.9–10.0)

## 2007-05-24 LAB — CBC WITH DIFFERENTIAL/PLATELET
BASO%: 0.4 % (ref 0.0–2.0)
LYMPH%: 31.6 % (ref 14.0–48.0)
MCHC: 33.6 g/dL (ref 32.0–36.0)
MONO#: 0.6 10*3/uL (ref 0.1–0.9)
RBC: 4.05 10*6/uL (ref 3.70–5.32)
WBC: 6.2 10*3/uL (ref 3.9–10.0)
lymph#: 2 10*3/uL (ref 0.9–3.3)

## 2007-06-07 ENCOUNTER — Ambulatory Visit: Payer: Self-pay | Admitting: Oncology

## 2007-06-07 LAB — CBC WITH DIFFERENTIAL/PLATELET
BASO%: 0.4 % (ref 0.0–2.0)
HCT: 34.7 % — ABNORMAL LOW (ref 34.8–46.6)
HGB: 11.6 g/dL (ref 11.6–15.9)
MCHC: 33.3 g/dL (ref 32.0–36.0)
MONO#: 0.6 10*3/uL (ref 0.1–0.9)
NEUT%: 56.3 % (ref 39.6–76.8)
WBC: 7 10*3/uL (ref 3.9–10.0)
lymph#: 2.2 10*3/uL (ref 0.9–3.3)

## 2007-07-06 LAB — CBC WITH DIFFERENTIAL/PLATELET
BASO%: 0.7 % (ref 0.0–2.0)
EOS%: 2.9 % (ref 0.0–7.0)
HCT: 34.7 % — ABNORMAL LOW (ref 34.8–46.6)
LYMPH%: 29.3 % (ref 14.0–48.0)
MCH: 29.8 pg (ref 26.0–34.0)
MCHC: 33.3 g/dL (ref 32.0–36.0)
MONO#: 0.6 10*3/uL (ref 0.1–0.9)
NEUT%: 60.2 % (ref 39.6–76.8)
Platelets: 305 10*3/uL (ref 145–400)
RBC: 3.88 10*6/uL (ref 3.70–5.32)
WBC: 8.3 10*3/uL (ref 3.9–10.0)

## 2007-07-12 LAB — COMPREHENSIVE METABOLIC PANEL
ALT: 15 U/L (ref 0–35)
AST: 16 U/L (ref 0–37)
Albumin: 4 g/dL (ref 3.5–5.2)
CO2: 23 mEq/L (ref 19–32)
Calcium: 9 mg/dL (ref 8.4–10.5)
Chloride: 106 mEq/L (ref 96–112)
Creatinine, Ser: 1.04 mg/dL (ref 0.40–1.20)
Potassium: 4.8 mEq/L (ref 3.5–5.3)
Sodium: 141 mEq/L (ref 135–145)
Total Protein: 7.1 g/dL (ref 6.0–8.3)

## 2007-07-12 LAB — VITAMIN B12: Vitamin B-12: 972 pg/mL — ABNORMAL HIGH (ref 211–911)

## 2007-07-12 LAB — CBC WITH DIFFERENTIAL/PLATELET
BASO%: 1.1 % (ref 0.0–2.0)
EOS%: 4.1 % (ref 0.0–7.0)
MCH: 29.4 pg (ref 26.0–34.0)
MCHC: 33 g/dL (ref 32.0–36.0)
MONO#: 0.7 10*3/uL (ref 0.1–0.9)
RBC: 3.85 10*6/uL (ref 3.70–5.32)
RDW: 12.9 % (ref 11.3–14.5)
WBC: 7.7 10*3/uL (ref 3.9–10.0)
lymph#: 2.6 10*3/uL (ref 0.9–3.3)

## 2007-07-12 LAB — IRON AND TIBC: TIBC: 270 ug/dL (ref 250–470)

## 2007-07-12 LAB — IGG, IGA, IGM
IgA: 167 mg/dL (ref 68–378)
IgG (Immunoglobin G), Serum: 1340 mg/dL (ref 694–1618)

## 2007-07-12 LAB — LACTATE DEHYDROGENASE: LDH: 160 U/L (ref 94–250)

## 2007-07-19 LAB — CBC WITH DIFFERENTIAL/PLATELET
EOS%: 4.5 % (ref 0.0–7.0)
LYMPH%: 33.6 % (ref 14.0–48.0)
MCH: 30.3 pg (ref 26.0–34.0)
MCHC: 33.8 g/dL (ref 32.0–36.0)
MCV: 89.6 fL (ref 81.0–101.0)
MONO%: 9.3 % (ref 0.0–13.0)
RBC: 3.96 10*6/uL (ref 3.70–5.32)
RDW: 15.6 % — ABNORMAL HIGH (ref 11.3–14.5)

## 2007-07-31 ENCOUNTER — Ambulatory Visit: Payer: Self-pay | Admitting: Oncology

## 2007-08-02 LAB — CBC WITH DIFFERENTIAL/PLATELET
BASO%: 0.2 % (ref 0.0–2.0)
Eosinophils Absolute: 0.2 10*3/uL (ref 0.0–0.5)
MCHC: 32.8 g/dL (ref 32.0–36.0)
MCV: 89.8 fL (ref 81.0–101.0)
MONO#: 0.6 10*3/uL (ref 0.1–0.9)
MONO%: 10.2 % (ref 0.0–13.0)
NEUT#: 3.3 10*3/uL (ref 1.5–6.5)
RBC: 4.16 10*6/uL (ref 3.70–5.32)
RDW: 15.8 % — ABNORMAL HIGH (ref 11.3–14.5)
WBC: 6.2 10*3/uL (ref 3.9–10.0)

## 2007-08-16 LAB — CBC WITH DIFFERENTIAL/PLATELET
EOS%: 2 % (ref 0.0–7.0)
LYMPH%: 30.1 % (ref 14.0–48.0)
MCH: 28 pg (ref 26.0–34.0)
MCHC: 31.5 g/dL — ABNORMAL LOW (ref 32.0–36.0)
MCV: 88.8 fL (ref 81.0–101.0)
MONO%: 8.9 % (ref 0.0–13.0)
Platelets: 322 10*3/uL (ref 145–400)
RBC: 4.21 10*6/uL (ref 3.70–5.32)
RDW: 15.5 % — ABNORMAL HIGH (ref 11.3–14.5)

## 2007-08-30 LAB — CBC WITH DIFFERENTIAL/PLATELET
BASO%: 0.3 % (ref 0.0–2.0)
Eosinophils Absolute: 0.2 10*3/uL (ref 0.0–0.5)
LYMPH%: 32.8 % (ref 14.0–48.0)
MCHC: 34.1 g/dL (ref 32.0–36.0)
MONO#: 0.6 10*3/uL (ref 0.1–0.9)
MONO%: 9.6 % (ref 0.0–13.0)
NEUT#: 3.6 10*3/uL (ref 1.5–6.5)
RBC: 3.78 10*6/uL (ref 3.70–5.32)
RDW: 15.7 % — ABNORMAL HIGH (ref 11.3–14.5)
WBC: 6.6 10*3/uL (ref 3.9–10.0)

## 2007-09-13 LAB — CBC WITH DIFFERENTIAL/PLATELET
Basophils Absolute: 0 10*3/uL (ref 0.0–0.1)
EOS%: 1.8 % (ref 0.0–7.0)
HGB: 11.4 g/dL — ABNORMAL LOW (ref 11.6–15.9)
LYMPH%: 28.4 % (ref 14.0–48.0)
MCH: 29.5 pg (ref 26.0–34.0)
MCV: 89.9 fL (ref 81.0–101.0)
MONO%: 11.1 % (ref 0.0–13.0)
NEUT%: 58.4 % (ref 39.6–76.8)
Platelets: 295 10*3/uL (ref 145–400)
RDW: 16.4 % — ABNORMAL HIGH (ref 11.3–14.5)

## 2007-09-25 ENCOUNTER — Ambulatory Visit: Payer: Self-pay | Admitting: Oncology

## 2007-10-11 LAB — CBC WITH DIFFERENTIAL/PLATELET
Basophils Absolute: 0.1 10*3/uL (ref 0.0–0.1)
EOS%: 1.4 % (ref 0.0–7.0)
Eosinophils Absolute: 0.1 10*3/uL (ref 0.0–0.5)
HCT: 34.2 % — ABNORMAL LOW (ref 34.8–46.6)
HGB: 11.5 g/dL — ABNORMAL LOW (ref 11.6–15.9)
MCH: 30.1 pg (ref 26.0–34.0)
MCV: 89.8 fL (ref 81.0–101.0)
NEUT#: 4.2 10*3/uL (ref 1.5–6.5)
NEUT%: 59.4 % (ref 39.6–76.8)
lymph#: 2.1 10*3/uL (ref 0.9–3.3)

## 2007-10-25 LAB — COMPREHENSIVE METABOLIC PANEL
ALT: 15 U/L (ref 0–35)
Alkaline Phosphatase: 66 U/L (ref 39–117)
Glucose, Bld: 113 mg/dL — ABNORMAL HIGH (ref 70–99)
Sodium: 139 mEq/L (ref 135–145)
Total Bilirubin: 0.3 mg/dL (ref 0.3–1.2)
Total Protein: 7.5 g/dL (ref 6.0–8.3)

## 2007-10-25 LAB — IRON AND TIBC
%SAT: 28 % (ref 20–55)
Iron: 81 ug/dL (ref 42–145)
TIBC: 286 ug/dL (ref 250–470)

## 2007-10-25 LAB — CBC WITH DIFFERENTIAL/PLATELET
Basophils Absolute: 0 10*3/uL (ref 0.0–0.1)
EOS%: 2.4 % (ref 0.0–7.0)
Eosinophils Absolute: 0.2 10*3/uL (ref 0.0–0.5)
HGB: 12 g/dL (ref 11.6–15.9)
LYMPH%: 31.1 % (ref 14.0–48.0)
MCH: 30.5 pg (ref 26.0–34.0)
MCV: 89.7 fL (ref 81.0–101.0)
MONO%: 8.2 % (ref 0.0–13.0)
NEUT%: 58.1 % (ref 39.6–76.8)
Platelets: 276 10*3/uL (ref 145–400)
RDW: 15.8 % — ABNORMAL HIGH (ref 11.3–14.5)

## 2007-10-25 LAB — FERRITIN: Ferritin: 271 ng/mL (ref 10–291)

## 2007-10-25 LAB — IGG, IGA, IGM: IgA: 175 mg/dL (ref 68–378)

## 2007-11-06 LAB — CBC WITH DIFFERENTIAL/PLATELET
Basophils Absolute: 0 10*3/uL (ref 0.0–0.1)
HCT: 35.7 % (ref 34.8–46.6)
HGB: 11.9 g/dL (ref 11.6–15.9)
MONO#: 0.5 10*3/uL (ref 0.1–0.9)
NEUT#: 4 10*3/uL (ref 1.5–6.5)
NEUT%: 59.4 % (ref 39.6–76.8)
WBC: 6.8 10*3/uL (ref 3.9–10.0)
lymph#: 2.1 10*3/uL (ref 0.9–3.3)

## 2007-11-20 ENCOUNTER — Ambulatory Visit: Payer: Self-pay | Admitting: Oncology

## 2007-12-06 LAB — CBC WITH DIFFERENTIAL/PLATELET
BASO%: 0.4 % (ref 0.0–2.0)
HCT: 37.2 % (ref 34.8–46.6)
MCHC: 33 g/dL (ref 32.0–36.0)
MONO#: 0.5 10*3/uL (ref 0.1–0.9)
RBC: 4.14 10*6/uL (ref 3.70–5.32)
RDW: 16.2 % — ABNORMAL HIGH (ref 11.3–14.5)
WBC: 6 10*3/uL (ref 3.9–10.0)
lymph#: 1.8 10*3/uL (ref 0.9–3.3)

## 2008-01-01 ENCOUNTER — Ambulatory Visit: Payer: Self-pay | Admitting: Oncology

## 2008-01-03 LAB — CBC WITH DIFFERENTIAL/PLATELET
BASO%: 0.5 % (ref 0.0–2.0)
EOS%: 1.8 % (ref 0.0–7.0)
Eosinophils Absolute: 0.1 10*3/uL (ref 0.0–0.5)
MCHC: 33.5 g/dL (ref 32.0–36.0)
MCV: 88.6 fL (ref 81.0–101.0)
MONO%: 7.9 % (ref 0.0–13.0)
NEUT#: 4.7 10*3/uL (ref 1.5–6.5)
RBC: 3.83 10*6/uL (ref 3.70–5.32)
RDW: 15.2 % — ABNORMAL HIGH (ref 11.3–14.5)

## 2008-01-17 LAB — CBC WITH DIFFERENTIAL/PLATELET
Eosinophils Absolute: 0.2 10*3/uL (ref 0.0–0.5)
LYMPH%: 34.4 % (ref 14.0–48.0)
MCH: 29.6 pg (ref 26.0–34.0)
MCHC: 33.2 g/dL (ref 32.0–36.0)
MCV: 89.2 fL (ref 81.0–101.0)
MONO%: 9.4 % (ref 0.0–13.0)
NEUT#: 3.6 10*3/uL (ref 1.5–6.5)
Platelets: 287 10*3/uL (ref 145–400)
RBC: 3.8 10*6/uL (ref 3.70–5.32)

## 2008-01-31 LAB — CBC WITH DIFFERENTIAL/PLATELET
BASO%: 0.7 % (ref 0.0–2.0)
LYMPH%: 33.1 % (ref 14.0–48.0)
MCHC: 33 g/dL (ref 32.0–36.0)
MONO#: 0.7 10*3/uL (ref 0.1–0.9)
MONO%: 11 % (ref 0.0–13.0)
Platelets: 298 10*3/uL (ref 145–400)
RBC: 3.94 10*6/uL (ref 3.70–5.32)
WBC: 6.7 10*3/uL (ref 3.9–10.0)

## 2008-02-14 LAB — IGG, IGA, IGM
IgA: 154 mg/dL (ref 68–378)
IgG (Immunoglobin G), Serum: 1350 mg/dL (ref 694–1618)

## 2008-02-14 LAB — COMPREHENSIVE METABOLIC PANEL
AST: 17 U/L (ref 0–37)
BUN: 20 mg/dL (ref 6–23)
CO2: 22 mEq/L (ref 19–32)
Calcium: 9.3 mg/dL (ref 8.4–10.5)
Chloride: 106 mEq/L (ref 96–112)
Creatinine, Ser: 1.24 mg/dL — ABNORMAL HIGH (ref 0.40–1.20)

## 2008-02-14 LAB — IRON AND TIBC
%SAT: 23 % (ref 20–55)
TIBC: 296 ug/dL (ref 250–470)

## 2008-02-14 LAB — LACTATE DEHYDROGENASE: LDH: 170 U/L (ref 94–250)

## 2008-02-26 ENCOUNTER — Ambulatory Visit: Payer: Self-pay | Admitting: Oncology

## 2008-02-28 LAB — CBC WITH DIFFERENTIAL/PLATELET
Basophils Absolute: 0 10*3/uL (ref 0.0–0.1)
EOS%: 3 % (ref 0.0–7.0)
HGB: 11.6 g/dL (ref 11.6–15.9)
LYMPH%: 29.7 % (ref 14.0–48.0)
MCH: 30 pg (ref 26.0–34.0)
MCV: 90.8 fL (ref 81.0–101.0)
MONO%: 10.8 % (ref 0.0–13.0)
RBC: 3.87 10*6/uL (ref 3.70–5.32)
RDW: 16.7 % — ABNORMAL HIGH (ref 11.3–14.5)

## 2008-03-13 LAB — CBC WITH DIFFERENTIAL/PLATELET
BASO%: 0.6 % (ref 0.0–2.0)
Basophils Absolute: 0 10*3/uL (ref 0.0–0.1)
Eosinophils Absolute: 0.1 10*3/uL (ref 0.0–0.5)
HCT: 35.7 % (ref 34.8–46.6)
HGB: 11.7 g/dL (ref 11.6–15.9)
MONO#: 0.6 10*3/uL (ref 0.1–0.9)
NEUT#: 3.4 10*3/uL (ref 1.5–6.5)
NEUT%: 55.8 % (ref 39.6–76.8)
Platelets: 273 10*3/uL (ref 145–400)
WBC: 6 10*3/uL (ref 3.9–10.0)
lymph#: 1.9 10*3/uL (ref 0.9–3.3)

## 2008-03-27 LAB — CBC WITH DIFFERENTIAL/PLATELET
Basophils Absolute: 0 10*3/uL (ref 0.0–0.1)
EOS%: 4.5 % (ref 0.0–7.0)
HCT: 37.3 % (ref 34.8–46.6)
HGB: 12.1 g/dL (ref 11.6–15.9)
LYMPH%: 28.9 % (ref 14.0–48.0)
MCH: 29.4 pg (ref 26.0–34.0)
MCV: 90.6 fL (ref 81.0–101.0)
NEUT%: 56.6 % (ref 39.6–76.8)
Platelets: 293 10*3/uL (ref 145–400)
lymph#: 1.8 10*3/uL (ref 0.9–3.3)

## 2008-04-10 LAB — CBC WITH DIFFERENTIAL/PLATELET
BASO%: 0.4 % (ref 0.0–2.0)
Eosinophils Absolute: 0.2 10*3/uL (ref 0.0–0.5)
LYMPH%: 36.9 % (ref 14.0–48.0)
MCHC: 33.1 g/dL (ref 32.0–36.0)
MCV: 89.3 fL (ref 81.0–101.0)
MONO#: 0.5 10*3/uL (ref 0.1–0.9)
MONO%: 7.1 % (ref 0.0–13.0)
NEUT#: 3.4 10*3/uL (ref 1.5–6.5)
Platelets: 305 10*3/uL (ref 145–400)
RBC: 4.02 10*6/uL (ref 3.70–5.32)
RDW: 15.6 % — ABNORMAL HIGH (ref 11.3–14.5)
WBC: 6.5 10*3/uL (ref 3.9–10.0)

## 2008-04-22 ENCOUNTER — Ambulatory Visit: Payer: Self-pay | Admitting: Oncology

## 2008-04-24 LAB — CBC WITH DIFFERENTIAL/PLATELET
BASO%: 0.5 % (ref 0.0–2.0)
Eosinophils Absolute: 0.2 10*3/uL (ref 0.0–0.5)
MCHC: 33 g/dL (ref 32.0–36.0)
MONO#: 0.6 10*3/uL (ref 0.1–0.9)
NEUT#: 3.7 10*3/uL (ref 1.5–6.5)
RBC: 3.99 10*6/uL (ref 3.70–5.32)
WBC: 6.6 10*3/uL (ref 3.9–10.0)
lymph#: 2 10*3/uL (ref 0.9–3.3)

## 2008-05-08 LAB — CBC WITH DIFFERENTIAL/PLATELET
BASO%: 0.3 % (ref 0.0–2.0)
Basophils Absolute: 0 10*3/uL (ref 0.0–0.1)
EOS%: 2.5 % (ref 0.0–7.0)
HGB: 12.8 g/dL (ref 11.6–15.9)
MCH: 29.6 pg (ref 26.0–34.0)
MONO#: 0.6 10*3/uL (ref 0.1–0.9)
RDW: 15.8 % — ABNORMAL HIGH (ref 11.3–14.5)
WBC: 6.8 10*3/uL (ref 3.9–10.0)
lymph#: 2.2 10*3/uL (ref 0.9–3.3)

## 2008-05-22 LAB — CBC WITH DIFFERENTIAL/PLATELET
Basophils Absolute: 0 10*3/uL (ref 0.0–0.1)
Eosinophils Absolute: 0.1 10*3/uL (ref 0.0–0.5)
HCT: 35.9 % (ref 34.8–46.6)
HGB: 11.8 g/dL (ref 11.6–15.9)
NEUT#: 3.1 10*3/uL (ref 1.5–6.5)
RDW: 15.8 % — ABNORMAL HIGH (ref 11.3–14.5)
lymph#: 2.3 10*3/uL (ref 0.9–3.3)

## 2008-06-06 LAB — CBC WITH DIFFERENTIAL/PLATELET
Basophils Absolute: 0.1 10*3/uL (ref 0.0–0.1)
EOS%: 2.1 % (ref 0.0–7.0)
Eosinophils Absolute: 0.1 10*3/uL (ref 0.0–0.5)
HCT: 37 % (ref 34.8–46.6)
HGB: 12.1 g/dL (ref 11.6–15.9)
MCH: 28.5 pg (ref 26.0–34.0)
MCV: 87.3 fL (ref 81.0–101.0)
MONO%: 9.2 % (ref 0.0–13.0)
NEUT#: 3.5 10*3/uL (ref 1.5–6.5)
NEUT%: 57.3 % (ref 39.6–76.8)
RDW: 15.3 % — ABNORMAL HIGH (ref 11.3–14.5)

## 2008-06-17 ENCOUNTER — Ambulatory Visit: Payer: Self-pay | Admitting: Oncology

## 2008-06-19 LAB — CBC WITH DIFFERENTIAL/PLATELET
Basophils Absolute: 0 10*3/uL (ref 0.0–0.1)
EOS%: 3.3 % (ref 0.0–7.0)
Eosinophils Absolute: 0.2 10*3/uL (ref 0.0–0.5)
HGB: 11.1 g/dL — ABNORMAL LOW (ref 11.6–15.9)
LYMPH%: 30.6 % (ref 14.0–48.0)
MCH: 29.5 pg (ref 26.0–34.0)
MCV: 88.1 fL (ref 81.0–101.0)
MONO%: 9.6 % (ref 0.0–13.0)
NEUT#: 3.3 10*3/uL (ref 1.5–6.5)
Platelets: 270 10*3/uL (ref 145–400)
RBC: 3.77 10*6/uL (ref 3.70–5.32)

## 2008-06-20 LAB — COMPREHENSIVE METABOLIC PANEL
AST: 13 U/L (ref 0–37)
Alkaline Phosphatase: 62 U/L (ref 39–117)
BUN: 21 mg/dL (ref 6–23)
Glucose, Bld: 203 mg/dL — ABNORMAL HIGH (ref 70–99)
Total Bilirubin: 0.4 mg/dL (ref 0.3–1.2)

## 2008-06-20 LAB — FERRITIN: Ferritin: 160 ng/mL (ref 10–291)

## 2008-06-20 LAB — IRON AND TIBC
%SAT: 32 % (ref 20–55)
Iron: 98 ug/dL (ref 42–145)

## 2008-06-20 LAB — IGG, IGA, IGM: IgM, Serum: 102 mg/dL (ref 60–263)

## 2008-06-20 LAB — VITAMIN B12: Vitamin B-12: 593 pg/mL (ref 211–911)

## 2008-07-11 LAB — CBC WITH DIFFERENTIAL/PLATELET
Eosinophils Absolute: 0.2 10*3/uL (ref 0.0–0.5)
HCT: 33.8 % — ABNORMAL LOW (ref 34.8–46.6)
HGB: 11.2 g/dL — ABNORMAL LOW (ref 11.6–15.9)
LYMPH%: 33.3 % (ref 14.0–48.0)
MONO#: 0.6 10*3/uL (ref 0.1–0.9)
NEUT#: 2.9 10*3/uL (ref 1.5–6.5)
NEUT%: 52.7 % (ref 39.6–76.8)
Platelets: 270 10*3/uL (ref 145–400)
WBC: 5.5 10*3/uL (ref 3.9–10.0)

## 2008-07-17 LAB — CBC WITH DIFFERENTIAL/PLATELET
BASO%: 0.5 % (ref 0.0–2.0)
Basophils Absolute: 0 10*3/uL (ref 0.0–0.1)
EOS%: 2.1 % (ref 0.0–7.0)
HCT: 34.3 % — ABNORMAL LOW (ref 34.8–46.6)
LYMPH%: 27.3 % (ref 14.0–48.0)
MCH: 29.5 pg (ref 26.0–34.0)
MCHC: 32.8 g/dL (ref 32.0–36.0)
MONO#: 0.6 10*3/uL (ref 0.1–0.9)
NEUT%: 61.8 % (ref 39.6–76.8)
Platelets: 329 10*3/uL (ref 145–400)

## 2008-07-25 LAB — CBC WITH DIFFERENTIAL/PLATELET
Basophils Absolute: 0 10*3/uL (ref 0.0–0.1)
EOS%: 1.6 % (ref 0.0–7.0)
Eosinophils Absolute: 0.1 10*3/uL (ref 0.0–0.5)
HCT: 35.1 % (ref 34.8–46.6)
HGB: 11.6 g/dL (ref 11.6–15.9)
MONO#: 0.5 10*3/uL (ref 0.1–0.9)
NEUT#: 4.3 10*3/uL (ref 1.5–6.5)
NEUT%: 62.8 % (ref 39.6–76.8)
RDW: 17 % — ABNORMAL HIGH (ref 11.3–14.5)
WBC: 6.9 10*3/uL (ref 3.9–10.0)
lymph#: 1.9 10*3/uL (ref 0.9–3.3)

## 2008-08-05 ENCOUNTER — Ambulatory Visit: Payer: Self-pay | Admitting: Oncology

## 2008-08-21 ENCOUNTER — Emergency Department (HOSPITAL_COMMUNITY): Admission: EM | Admit: 2008-08-21 | Discharge: 2008-08-22 | Payer: Self-pay | Admitting: Emergency Medicine

## 2008-08-21 LAB — CBC WITH DIFFERENTIAL/PLATELET
Eosinophils Absolute: 0.2 10*3/uL (ref 0.0–0.5)
MONO#: 0.5 10*3/uL (ref 0.1–0.9)
NEUT#: 3.5 10*3/uL (ref 1.5–6.5)
Platelets: 252 10*3/uL (ref 145–400)
RBC: 4.03 10*6/uL (ref 3.70–5.32)
RDW: 15.6 % — ABNORMAL HIGH (ref 11.3–14.5)
WBC: 6 10*3/uL (ref 3.9–10.0)

## 2008-09-04 LAB — CBC WITH DIFFERENTIAL/PLATELET
Basophils Absolute: 0.1 10*3/uL (ref 0.0–0.1)
EOS%: 2.8 % (ref 0.0–7.0)
HGB: 11.3 g/dL — ABNORMAL LOW (ref 11.6–15.9)
LYMPH%: 27 % (ref 14.0–49.7)
MCH: 28.5 pg (ref 25.1–34.0)
MCV: 88.9 fL (ref 79.5–101.0)
MONO%: 9.8 % (ref 0.0–14.0)
Platelets: 269 10*3/uL (ref 145–400)
RBC: 3.97 10*6/uL (ref 3.70–5.45)
RDW: 15.4 % — ABNORMAL HIGH (ref 11.2–14.5)

## 2008-09-18 LAB — CBC WITH DIFFERENTIAL/PLATELET
EOS%: 3.8 % (ref 0.0–7.0)
Eosinophils Absolute: 0.2 10*3/uL (ref 0.0–0.5)
MCV: 89.3 fL (ref 79.5–101.0)
MONO%: 10.8 % (ref 0.0–14.0)
NEUT#: 2.9 10*3/uL (ref 1.5–6.5)
RBC: 3.52 10*6/uL — ABNORMAL LOW (ref 3.70–5.45)
RDW: 16.2 % — ABNORMAL HIGH (ref 11.2–14.5)

## 2008-09-29 ENCOUNTER — Ambulatory Visit: Payer: Self-pay | Admitting: Oncology

## 2008-10-02 LAB — CBC WITH DIFFERENTIAL/PLATELET
Basophils Absolute: 0 10*3/uL (ref 0.0–0.1)
Eosinophils Absolute: 0.1 10*3/uL (ref 0.0–0.5)
HCT: 34.1 % — ABNORMAL LOW (ref 34.8–46.6)
LYMPH%: 27.8 % (ref 14.0–49.7)
MONO#: 0.6 10*3/uL (ref 0.1–0.9)
NEUT#: 3.5 10*3/uL (ref 1.5–6.5)
NEUT%: 59.5 % (ref 38.4–76.8)
Platelets: 290 10*3/uL (ref 145–400)
WBC: 5.9 10*3/uL (ref 3.9–10.3)

## 2008-10-16 LAB — CBC WITH DIFFERENTIAL/PLATELET
BASO%: 0.5 % (ref 0.0–2.0)
EOS%: 2.5 % (ref 0.0–7.0)
HCT: 35.3 % (ref 34.8–46.6)
LYMPH%: 25.8 % (ref 14.0–49.7)
MCH: 29.6 pg (ref 25.1–34.0)
MCHC: 33 g/dL (ref 31.5–36.0)
MONO#: 0.8 10*3/uL (ref 0.1–0.9)
NEUT%: 60.4 % (ref 38.4–76.8)
Platelets: 275 10*3/uL (ref 145–400)

## 2008-10-23 LAB — CBC WITH DIFFERENTIAL/PLATELET
BASO%: 0.3 % (ref 0.0–2.0)
EOS%: 1.5 % (ref 0.0–7.0)
HCT: 35.2 % (ref 34.8–46.6)
MCH: 29.7 pg (ref 25.1–34.0)
MCHC: 33 g/dL (ref 31.5–36.0)
MONO%: 8.7 % (ref 0.0–14.0)
NEUT%: 66.9 % (ref 38.4–76.8)
lymph#: 1.8 10*3/uL (ref 0.9–3.3)

## 2008-10-23 LAB — COMPREHENSIVE METABOLIC PANEL WITH GFR
ALT: 15 U/L (ref 0–35)
AST: 18 U/L (ref 0–37)
Albumin: 3.9 g/dL (ref 3.5–5.2)
Alkaline Phosphatase: 64 U/L (ref 39–117)
BUN: 15 mg/dL (ref 6–23)
CO2: 23 meq/L (ref 19–32)
Calcium: 8.6 mg/dL (ref 8.4–10.5)
Chloride: 100 meq/L (ref 96–112)
Creatinine, Ser: 1.14 mg/dL (ref 0.40–1.20)
Glucose, Bld: 154 mg/dL — ABNORMAL HIGH (ref 70–99)
Potassium: 4.4 meq/L (ref 3.5–5.3)
Sodium: 134 meq/L — ABNORMAL LOW (ref 135–145)
Total Bilirubin: 0.3 mg/dL (ref 0.3–1.2)
Total Protein: 7 g/dL (ref 6.0–8.3)

## 2008-10-23 LAB — IRON AND TIBC
%SAT: 13 % — ABNORMAL LOW (ref 20–55)
Iron: 34 ug/dL — ABNORMAL LOW (ref 42–145)
TIBC: 266 ug/dL (ref 250–470)
UIBC: 232 ug/dL

## 2008-10-30 LAB — CBC WITH DIFFERENTIAL/PLATELET
Basophils Absolute: 0 10*3/uL (ref 0.0–0.1)
EOS%: 2.6 % (ref 0.0–7.0)
Eosinophils Absolute: 0.1 10*3/uL (ref 0.0–0.5)
HCT: 35 % (ref 34.8–46.6)
HGB: 11.6 g/dL (ref 11.6–15.9)
MCH: 29.6 pg (ref 25.1–34.0)
MCV: 89.4 fL (ref 79.5–101.0)
MONO%: 10 % (ref 0.0–14.0)
NEUT%: 52.4 % (ref 38.4–76.8)

## 2008-11-13 ENCOUNTER — Ambulatory Visit: Payer: Self-pay | Admitting: Oncology

## 2008-11-13 LAB — CBC WITH DIFFERENTIAL/PLATELET
Basophils Absolute: 0 10*3/uL (ref 0.0–0.1)
Eosinophils Absolute: 0.1 10*3/uL (ref 0.0–0.5)
HCT: 35.7 % (ref 34.8–46.6)
HGB: 11.7 g/dL (ref 11.6–15.9)
MCV: 89.3 fL (ref 79.5–101.0)
MONO%: 11.7 % (ref 0.0–14.0)
NEUT#: 3.6 10*3/uL (ref 1.5–6.5)
RDW: 15.9 % — ABNORMAL HIGH (ref 11.2–14.5)
lymph#: 1.9 10*3/uL (ref 0.9–3.3)

## 2008-11-27 LAB — CBC WITH DIFFERENTIAL/PLATELET
Basophils Absolute: 0.1 10*3/uL (ref 0.0–0.1)
Eosinophils Absolute: 0.1 10*3/uL (ref 0.0–0.5)
HCT: 38.2 % (ref 34.8–46.6)
HGB: 12.1 g/dL (ref 11.6–15.9)
MCV: 89.3 fL (ref 79.5–101.0)
MONO%: 13.1 % (ref 0.0–14.0)
NEUT#: 3.3 10*3/uL (ref 1.5–6.5)
RDW: 15.8 % — ABNORMAL HIGH (ref 11.2–14.5)
lymph#: 2 10*3/uL (ref 0.9–3.3)

## 2008-12-12 LAB — CBC WITH DIFFERENTIAL/PLATELET
BASO%: 0.7 % (ref 0.0–2.0)
Eosinophils Absolute: 0.1 10*3/uL (ref 0.0–0.5)
LYMPH%: 34.6 % (ref 14.0–49.7)
MCHC: 31.8 g/dL (ref 31.5–36.0)
MONO#: 0.6 10*3/uL (ref 0.1–0.9)
NEUT#: 3.1 10*3/uL (ref 1.5–6.5)
RBC: 4.19 10*6/uL (ref 3.70–5.45)
RDW: 15.2 % — ABNORMAL HIGH (ref 11.2–14.5)
WBC: 5.9 10*3/uL (ref 3.9–10.3)
lymph#: 2.1 10*3/uL (ref 0.9–3.3)
nRBC: 0 % (ref 0–0)

## 2008-12-25 LAB — CBC WITH DIFFERENTIAL/PLATELET
Basophils Absolute: 0 10*3/uL (ref 0.0–0.1)
EOS%: 1.6 % (ref 0.0–7.0)
HGB: 11.1 g/dL — ABNORMAL LOW (ref 11.6–15.9)
MCH: 29.4 pg (ref 25.1–34.0)
MONO#: 0.7 10*3/uL (ref 0.1–0.9)
NEUT#: 4.9 10*3/uL (ref 1.5–6.5)
RDW: 15.4 % — ABNORMAL HIGH (ref 11.2–14.5)
WBC: 7.5 10*3/uL (ref 3.9–10.3)
lymph#: 1.8 10*3/uL (ref 0.9–3.3)

## 2009-01-06 ENCOUNTER — Ambulatory Visit: Payer: Self-pay | Admitting: Oncology

## 2009-01-08 LAB — CBC WITH DIFFERENTIAL/PLATELET
Basophils Absolute: 0 10*3/uL (ref 0.0–0.1)
EOS%: 2.1 % (ref 0.0–7.0)
Eosinophils Absolute: 0.1 10*3/uL (ref 0.0–0.5)
LYMPH%: 33 % (ref 14.0–49.7)
MCH: 29.4 pg (ref 25.1–34.0)
MCV: 88 fL (ref 79.5–101.0)
MONO%: 10.8 % (ref 0.0–14.0)
NEUT#: 3.1 10*3/uL (ref 1.5–6.5)
Platelets: 254 10*3/uL (ref 145–400)
RBC: 3.82 10*6/uL (ref 3.70–5.45)
RDW: 16.6 % — ABNORMAL HIGH (ref 11.2–14.5)

## 2009-01-22 LAB — CBC WITH DIFFERENTIAL/PLATELET
BASO%: 0.4 % (ref 0.0–2.0)
EOS%: 1.6 % (ref 0.0–7.0)
LYMPH%: 34.1 % (ref 14.0–49.7)
MCHC: 32.1 g/dL (ref 31.5–36.0)
MCV: 88.6 fL (ref 79.5–101.0)
MONO%: 11.3 % (ref 0.0–14.0)
NEUT#: 2.9 10*3/uL (ref 1.5–6.5)
Platelets: 242 10*3/uL (ref 145–400)
RBC: 3.87 10*6/uL (ref 3.70–5.45)
RDW: 16.7 % — ABNORMAL HIGH (ref 11.2–14.5)
WBC: 5.6 10*3/uL (ref 3.9–10.3)

## 2009-02-03 ENCOUNTER — Ambulatory Visit: Payer: Self-pay | Admitting: Oncology

## 2009-02-05 LAB — CBC WITH DIFFERENTIAL/PLATELET
BASO%: 0.5 % (ref 0.0–2.0)
EOS%: 1.2 % (ref 0.0–7.0)
LYMPH%: 31.2 % (ref 14.0–49.7)
MCH: 29 pg (ref 25.1–34.0)
MCHC: 32.8 g/dL (ref 31.5–36.0)
MCV: 88.3 fL (ref 79.5–101.0)
MONO%: 10.5 % (ref 0.0–14.0)
NEUT#: 3.8 10*3/uL (ref 1.5–6.5)
Platelets: 263 10*3/uL (ref 145–400)
RBC: 3.88 10*6/uL (ref 3.70–5.45)
RDW: 16.7 % — ABNORMAL HIGH (ref 11.2–14.5)

## 2009-02-19 LAB — CBC WITH DIFFERENTIAL/PLATELET
BASO%: 0.3 % (ref 0.0–2.0)
Eosinophils Absolute: 0.1 10*3/uL (ref 0.0–0.5)
LYMPH%: 25.2 % (ref 14.0–49.7)
MCHC: 32.6 g/dL (ref 31.5–36.0)
MONO#: 0.6 10*3/uL (ref 0.1–0.9)
NEUT#: 4 10*3/uL (ref 1.5–6.5)
Platelets: 248 10*3/uL (ref 145–400)
RBC: 3.88 10*6/uL (ref 3.70–5.45)
RDW: 16.3 % — ABNORMAL HIGH (ref 11.2–14.5)
WBC: 6.3 10*3/uL (ref 3.9–10.3)

## 2009-02-19 LAB — IGG, IGA, IGM
IgA: 162 mg/dL (ref 68–378)
IgM, Serum: 91 mg/dL (ref 60–263)

## 2009-02-19 LAB — COMPREHENSIVE METABOLIC PANEL
ALT: 12 U/L (ref 0–35)
AST: 14 U/L (ref 0–37)
Alkaline Phosphatase: 63 U/L (ref 39–117)
Creatinine, Ser: 1.09 mg/dL (ref 0.40–1.20)
Sodium: 141 mEq/L (ref 135–145)
Total Bilirubin: 0.3 mg/dL (ref 0.3–1.2)
Total Protein: 6.9 g/dL (ref 6.0–8.3)

## 2009-02-19 LAB — IRON AND TIBC
%SAT: 15 % — ABNORMAL LOW (ref 20–55)
TIBC: 260 ug/dL (ref 250–470)
UIBC: 222 ug/dL

## 2009-03-05 ENCOUNTER — Ambulatory Visit: Payer: Self-pay | Admitting: Oncology

## 2009-03-05 LAB — CBC WITH DIFFERENTIAL/PLATELET
Basophils Absolute: 0 10*3/uL (ref 0.0–0.1)
EOS%: 1.2 % (ref 0.0–7.0)
Eosinophils Absolute: 0.1 10*3/uL (ref 0.0–0.5)
HCT: 35.7 % (ref 34.8–46.6)
HGB: 11.7 g/dL (ref 11.6–15.9)
MCH: 28.8 pg (ref 25.1–34.0)
MCV: 87.8 fL (ref 79.5–101.0)
MONO%: 9.9 % (ref 0.0–14.0)
NEUT#: 4 10*3/uL (ref 1.5–6.5)
NEUT%: 61.7 % (ref 38.4–76.8)
lymph#: 1.7 10*3/uL (ref 0.9–3.3)

## 2009-04-02 LAB — CBC WITH DIFFERENTIAL/PLATELET
Basophils Absolute: 0 10*3/uL (ref 0.0–0.1)
EOS%: 1.4 % (ref 0.0–7.0)
Eosinophils Absolute: 0.1 10*3/uL (ref 0.0–0.5)
HGB: 12 g/dL (ref 11.6–15.9)
LYMPH%: 28.9 % (ref 14.0–49.7)
MCH: 29.3 pg (ref 25.1–34.0)
MCV: 87.1 fL (ref 79.5–101.0)
MONO%: 10.9 % (ref 0.0–14.0)
NEUT%: 58.2 % (ref 38.4–76.8)
Platelets: 265 10*3/uL (ref 145–400)
RDW: 16.2 % — ABNORMAL HIGH (ref 11.2–14.5)

## 2009-04-14 ENCOUNTER — Ambulatory Visit: Payer: Self-pay | Admitting: Oncology

## 2009-04-16 LAB — CBC WITH DIFFERENTIAL/PLATELET
Eosinophils Absolute: 0.1 10*3/uL (ref 0.0–0.5)
MONO#: 0.6 10*3/uL (ref 0.1–0.9)
NEUT#: 3 10*3/uL (ref 1.5–6.5)
Platelets: 240 10*3/uL (ref 145–400)
RBC: 4.01 10*6/uL (ref 3.70–5.45)
RDW: 16.3 % — ABNORMAL HIGH (ref 11.2–14.5)
WBC: 5.8 10*3/uL (ref 3.9–10.3)
lymph#: 2 10*3/uL (ref 0.9–3.3)

## 2009-04-30 LAB — CBC WITH DIFFERENTIAL/PLATELET
Eosinophils Absolute: 0.1 10*3/uL (ref 0.0–0.5)
HCT: 37.4 % (ref 34.8–46.6)
HGB: 11.8 g/dL (ref 11.6–15.9)
LYMPH%: 37.3 % (ref 14.0–49.7)
MONO#: 0.7 10*3/uL (ref 0.1–0.9)
NEUT#: 3.2 10*3/uL (ref 1.5–6.5)
NEUT%: 50.2 % (ref 38.4–76.8)
Platelets: 253 10*3/uL (ref 145–400)
WBC: 6.4 10*3/uL (ref 3.9–10.3)
lymph#: 2.4 10*3/uL (ref 0.9–3.3)

## 2009-05-12 ENCOUNTER — Ambulatory Visit: Payer: Self-pay | Admitting: Oncology

## 2009-05-14 LAB — CBC WITH DIFFERENTIAL/PLATELET
BASO%: 0.5 % (ref 0.0–2.0)
Basophils Absolute: 0 10*3/uL (ref 0.0–0.1)
EOS%: 1.2 % (ref 0.0–7.0)
HCT: 36.1 % (ref 34.8–46.6)
HGB: 11.8 g/dL (ref 11.6–15.9)
MCH: 28.9 pg (ref 25.1–34.0)
MCHC: 32.6 g/dL (ref 31.5–36.0)
MCV: 88.7 fL (ref 79.5–101.0)
MONO%: 9.9 % (ref 0.0–14.0)
NEUT%: 55.5 % (ref 38.4–76.8)
lymph#: 2.3 10*3/uL (ref 0.9–3.3)

## 2009-05-29 LAB — CBC WITH DIFFERENTIAL/PLATELET
BASO%: 0.5 % (ref 0.0–2.0)
EOS%: 2.7 % (ref 0.0–7.0)
MCH: 28.1 pg (ref 25.1–34.0)
MCHC: 31.8 g/dL (ref 31.5–36.0)
MCV: 88.5 fL (ref 79.5–101.0)
MONO%: 9.3 % (ref 0.0–14.0)
RBC: 3.81 10*6/uL (ref 3.70–5.45)
RDW: 15.9 % — ABNORMAL HIGH (ref 11.2–14.5)
lymph#: 2.2 10*3/uL (ref 0.9–3.3)

## 2009-06-03 HISTORY — PX: CORONARY ARTERY BYPASS GRAFT: SHX141

## 2009-06-07 ENCOUNTER — Ambulatory Visit: Payer: Self-pay | Admitting: Internal Medicine

## 2009-06-07 ENCOUNTER — Inpatient Hospital Stay (HOSPITAL_COMMUNITY): Admission: EM | Admit: 2009-06-07 | Discharge: 2009-06-19 | Payer: Self-pay | Admitting: Emergency Medicine

## 2009-06-09 HISTORY — PX: CARDIAC CATHETERIZATION: SHX172

## 2009-06-10 ENCOUNTER — Encounter: Payer: Self-pay | Admitting: Surgery

## 2009-06-11 ENCOUNTER — Encounter: Payer: Self-pay | Admitting: Surgery

## 2009-06-12 ENCOUNTER — Ambulatory Visit: Payer: Self-pay | Admitting: Surgery

## 2009-07-21 ENCOUNTER — Ambulatory Visit: Payer: Self-pay | Admitting: Surgery

## 2009-07-21 ENCOUNTER — Encounter: Admission: RE | Admit: 2009-07-21 | Discharge: 2009-07-21 | Payer: Self-pay | Admitting: Surgery

## 2009-07-23 ENCOUNTER — Encounter (HOSPITAL_COMMUNITY): Admission: RE | Admit: 2009-07-23 | Discharge: 2009-10-21 | Payer: Self-pay | Admitting: Cardiology

## 2009-07-30 ENCOUNTER — Ambulatory Visit: Payer: Self-pay | Admitting: Oncology

## 2009-08-11 LAB — CBC WITH DIFFERENTIAL/PLATELET
BASO%: 0.7 % (ref 0.0–2.0)
HCT: 32.9 % — ABNORMAL LOW (ref 34.8–46.6)
HGB: 10.4 g/dL — ABNORMAL LOW (ref 11.6–15.9)
MCHC: 31.6 g/dL (ref 31.5–36.0)
MONO#: 0.7 10*3/uL (ref 0.1–0.9)
NEUT%: 60.6 % (ref 38.4–76.8)
RDW: 15.6 % — ABNORMAL HIGH (ref 11.2–14.5)
WBC: 8.4 10*3/uL (ref 3.9–10.3)
lymph#: 2.4 10*3/uL (ref 0.9–3.3)

## 2009-08-25 LAB — CBC WITH DIFFERENTIAL/PLATELET
Basophils Absolute: 0 10*3/uL (ref 0.0–0.1)
Eosinophils Absolute: 0.2 10*3/uL (ref 0.0–0.5)
HGB: 10.2 g/dL — ABNORMAL LOW (ref 11.6–15.9)
MCV: 91.2 fL (ref 79.5–101.0)
MONO#: 0.7 10*3/uL (ref 0.1–0.9)
MONO%: 10 % (ref 0.0–14.0)
NEUT#: 3.9 10*3/uL (ref 1.5–6.5)
RDW: 15.9 % — ABNORMAL HIGH (ref 11.2–14.5)
WBC: 6.9 10*3/uL (ref 3.9–10.3)
lymph#: 2.1 10*3/uL (ref 0.9–3.3)
nRBC: 0 % (ref 0–0)

## 2009-09-08 ENCOUNTER — Ambulatory Visit: Payer: Self-pay | Admitting: Oncology

## 2009-09-10 LAB — CBC WITH DIFFERENTIAL/PLATELET
Eosinophils Absolute: 0.1 10*3/uL (ref 0.0–0.5)
HCT: 34.2 % — ABNORMAL LOW (ref 34.8–46.6)
LYMPH%: 27.3 % (ref 14.0–49.7)
MONO#: 0.7 10*3/uL (ref 0.1–0.9)
NEUT#: 3.9 10*3/uL (ref 1.5–6.5)
NEUT%: 59.7 % (ref 38.4–76.8)
Platelets: 290 10*3/uL (ref 145–400)
RBC: 3.8 10*6/uL (ref 3.70–5.45)
WBC: 6.6 10*3/uL (ref 3.9–10.3)
lymph#: 1.8 10*3/uL (ref 0.9–3.3)

## 2009-09-10 LAB — COMPREHENSIVE METABOLIC PANEL
CO2: 24 mEq/L (ref 19–32)
Calcium: 9.2 mg/dL (ref 8.4–10.5)
Chloride: 103 mEq/L (ref 96–112)
Glucose, Bld: 142 mg/dL — ABNORMAL HIGH (ref 70–99)
Sodium: 140 mEq/L (ref 135–145)
Total Bilirubin: 0.4 mg/dL (ref 0.3–1.2)
Total Protein: 7.4 g/dL (ref 6.0–8.3)

## 2009-09-10 LAB — LACTATE DEHYDROGENASE: LDH: 148 U/L (ref 94–250)

## 2009-09-10 LAB — FERRITIN: Ferritin: 135 ng/mL (ref 10–291)

## 2009-09-10 LAB — IRON AND TIBC
TIBC: 247 ug/dL — ABNORMAL LOW (ref 250–470)
UIBC: 206 ug/dL

## 2009-09-10 LAB — VITAMIN B12: Vitamin B-12: 602 pg/mL (ref 211–911)

## 2009-09-18 HISTORY — PX: BREAST BIOPSY: SHX20

## 2009-09-24 LAB — CBC WITH DIFFERENTIAL/PLATELET
BASO%: 0.5 % (ref 0.0–2.0)
LYMPH%: 26.6 % (ref 14.0–49.7)
MCHC: 33 g/dL (ref 31.5–36.0)
MONO#: 0.7 10*3/uL (ref 0.1–0.9)
RBC: 3.59 10*6/uL — ABNORMAL LOW (ref 3.70–5.45)
WBC: 8.1 10*3/uL (ref 3.9–10.3)
lymph#: 2.2 10*3/uL (ref 0.9–3.3)

## 2009-10-08 ENCOUNTER — Ambulatory Visit: Payer: Self-pay | Admitting: Oncology

## 2009-10-08 LAB — CBC WITH DIFFERENTIAL/PLATELET
BASO%: 0.4 % (ref 0.0–2.0)
Basophils Absolute: 0 10*3/uL (ref 0.0–0.1)
EOS%: 1.5 % (ref 0.0–7.0)
HGB: 11 g/dL — ABNORMAL LOW (ref 11.6–15.9)
MCH: 28.9 pg (ref 25.1–34.0)
MCHC: 32.2 g/dL (ref 31.5–36.0)
RBC: 3.81 10*6/uL (ref 3.70–5.45)
RDW: 16.3 % — ABNORMAL HIGH (ref 11.2–14.5)
lymph#: 1.6 10*3/uL (ref 0.9–3.3)

## 2009-10-22 ENCOUNTER — Encounter (HOSPITAL_COMMUNITY): Admission: RE | Admit: 2009-10-22 | Discharge: 2009-11-02 | Payer: Self-pay | Admitting: Cardiology

## 2009-10-22 LAB — CBC WITH DIFFERENTIAL/PLATELET
Basophils Absolute: 0 10*3/uL (ref 0.0–0.1)
Eosinophils Absolute: 0.2 10*3/uL (ref 0.0–0.5)
HGB: 12.1 g/dL (ref 11.6–15.9)
NEUT#: 3.1 10*3/uL (ref 1.5–6.5)
RDW: 15.4 % — ABNORMAL HIGH (ref 11.2–14.5)
WBC: 6.1 10*3/uL (ref 3.9–10.3)
lymph#: 2.1 10*3/uL (ref 0.9–3.3)

## 2009-11-06 LAB — CBC WITH DIFFERENTIAL/PLATELET
BASO%: 0.5 % (ref 0.0–2.0)
Eosinophils Absolute: 0.2 10*3/uL (ref 0.0–0.5)
LYMPH%: 27.5 % (ref 14.0–49.7)
MCH: 27.9 pg (ref 25.1–34.0)
MCHC: 32.2 g/dL (ref 31.5–36.0)
MCV: 86.9 fL (ref 79.5–101.0)
MONO%: 12.7 % (ref 0.0–14.0)
Platelets: 271 10*3/uL (ref 145–400)
RBC: 4.26 10*6/uL (ref 3.70–5.45)

## 2009-11-18 ENCOUNTER — Ambulatory Visit: Payer: Self-pay | Admitting: Oncology

## 2009-11-19 LAB — CBC WITH DIFFERENTIAL/PLATELET
BASO%: 0.3 % (ref 0.0–2.0)
LYMPH%: 30.1 % (ref 14.0–49.7)
MCHC: 31.3 g/dL — ABNORMAL LOW (ref 31.5–36.0)
MONO#: 0.7 10*3/uL (ref 0.1–0.9)
Platelets: 238 10*3/uL (ref 145–400)
RBC: 3.85 10*6/uL (ref 3.70–5.45)
WBC: 6.9 10*3/uL (ref 3.9–10.3)
nRBC: 0 % (ref 0–0)

## 2009-12-03 LAB — CBC WITH DIFFERENTIAL/PLATELET
BASO%: 0.8 % (ref 0.0–2.0)
Eosinophils Absolute: 0.2 10*3/uL (ref 0.0–0.5)
HCT: 33.4 % — ABNORMAL LOW (ref 34.8–46.6)
HGB: 10.6 g/dL — ABNORMAL LOW (ref 11.6–15.9)
MCHC: 31.7 g/dL (ref 31.5–36.0)
MONO#: 1 10*3/uL — ABNORMAL HIGH (ref 0.1–0.9)
NEUT#: 4.5 10*3/uL (ref 1.5–6.5)
NEUT%: 59.7 % (ref 38.4–76.8)
Platelets: 243 10*3/uL (ref 145–400)
WBC: 7.5 10*3/uL (ref 3.9–10.3)
lymph#: 1.8 10*3/uL (ref 0.9–3.3)

## 2009-12-17 LAB — CBC WITH DIFFERENTIAL/PLATELET
Basophils Absolute: 0 10*3/uL (ref 0.0–0.1)
Eosinophils Absolute: 0.1 10*3/uL (ref 0.0–0.5)
HCT: 32 % — ABNORMAL LOW (ref 34.8–46.6)
HGB: 10.8 g/dL — ABNORMAL LOW (ref 11.6–15.9)
MCV: 85.5 fL (ref 79.5–101.0)
MONO%: 11.5 % (ref 0.0–14.0)
NEUT#: 3.6 10*3/uL (ref 1.5–6.5)
NEUT%: 58.1 % (ref 38.4–76.8)
RDW: 16.9 % — ABNORMAL HIGH (ref 11.2–14.5)
lymph#: 1.7 10*3/uL (ref 0.9–3.3)

## 2009-12-29 ENCOUNTER — Ambulatory Visit: Payer: Self-pay | Admitting: Oncology

## 2009-12-31 LAB — CBC WITH DIFFERENTIAL/PLATELET
Basophils Absolute: 0 10*3/uL (ref 0.0–0.1)
Eosinophils Absolute: 0.1 10*3/uL (ref 0.0–0.5)
HGB: 10.3 g/dL — ABNORMAL LOW (ref 11.6–15.9)
LYMPH%: 25.5 % (ref 14.0–49.7)
MCV: 86 fL (ref 79.5–101.0)
MONO%: 10.9 % (ref 0.0–14.0)
NEUT#: 4.2 10*3/uL (ref 1.5–6.5)
NEUT%: 61.6 % (ref 38.4–76.8)
Platelets: 276 10*3/uL (ref 145–400)

## 2010-01-14 LAB — CBC WITH DIFFERENTIAL/PLATELET
Basophils Absolute: 0 10*3/uL (ref 0.0–0.1)
EOS%: 4.5 % (ref 0.0–7.0)
Eosinophils Absolute: 0.3 10*3/uL (ref 0.0–0.5)
HGB: 10.7 g/dL — ABNORMAL LOW (ref 11.6–15.9)
MCH: 29 pg (ref 25.1–34.0)
MCV: 86.5 fL (ref 79.5–101.0)
MONO%: 8.9 % (ref 0.0–14.0)
NEUT#: 3.8 10*3/uL (ref 1.5–6.5)
RBC: 3.68 10*6/uL — ABNORMAL LOW (ref 3.70–5.45)
RDW: 17.5 % — ABNORMAL HIGH (ref 11.2–14.5)
lymph#: 2.1 10*3/uL (ref 0.9–3.3)

## 2010-01-17 LAB — COMPREHENSIVE METABOLIC PANEL
ALT: 9 U/L (ref 0–35)
Albumin: 3.9 g/dL (ref 3.5–5.2)
Alkaline Phosphatase: 60 U/L (ref 39–117)
Potassium: 4.4 mEq/L (ref 3.5–5.3)
Sodium: 137 mEq/L (ref 135–145)
Total Bilirubin: 0.4 mg/dL (ref 0.3–1.2)
Total Protein: 7.2 g/dL (ref 6.0–8.3)

## 2010-01-17 LAB — VITAMIN B12: Vitamin B-12: 571 pg/mL (ref 211–911)

## 2010-01-17 LAB — LACTATE DEHYDROGENASE: LDH: 148 U/L (ref 94–250)

## 2010-01-17 LAB — TRANSFERRIN RECEPTOR, SOLUABLE: Transferrin Receptor, Soluble: 30.3 nmol/L

## 2010-01-17 LAB — IRON AND TIBC: %SAT: 27 % (ref 20–55)

## 2010-01-28 ENCOUNTER — Ambulatory Visit: Payer: Self-pay | Admitting: Oncology

## 2010-01-28 LAB — CBC WITH DIFFERENTIAL/PLATELET
Eosinophils Absolute: 0.2 10*3/uL (ref 0.0–0.5)
HGB: 10.8 g/dL — ABNORMAL LOW (ref 11.6–15.9)
MONO#: 0.8 10*3/uL (ref 0.1–0.9)
MONO%: 10.3 % (ref 0.0–14.0)
NEUT#: 4.5 10*3/uL (ref 1.5–6.5)
RBC: 3.82 10*6/uL (ref 3.70–5.45)
RDW: 17.5 % — ABNORMAL HIGH (ref 11.2–14.5)
WBC: 7.4 10*3/uL (ref 3.9–10.3)
lymph#: 1.9 10*3/uL (ref 0.9–3.3)

## 2010-02-11 LAB — CBC WITH DIFFERENTIAL/PLATELET
BASO%: 0.2 % (ref 0.0–2.0)
Basophils Absolute: 0 10*3/uL (ref 0.0–0.1)
EOS%: 3.5 % (ref 0.0–7.0)
HCT: 31.9 % — ABNORMAL LOW (ref 34.8–46.6)
HGB: 10.5 g/dL — ABNORMAL LOW (ref 11.6–15.9)
LYMPH%: 26.5 % (ref 14.0–49.7)
MCH: 29 pg (ref 25.1–34.0)
MCHC: 33.1 g/dL (ref 31.5–36.0)
MCV: 87.6 fL (ref 79.5–101.0)
MONO%: 9.9 % (ref 0.0–14.0)
NEUT%: 59.9 % (ref 38.4–76.8)
Platelets: 309 10*3/uL (ref 145–400)
lymph#: 1.8 10*3/uL (ref 0.9–3.3)

## 2010-02-25 LAB — CBC WITH DIFFERENTIAL/PLATELET
BASO%: 0.6 % (ref 0.0–2.0)
Basophils Absolute: 0 10*3/uL (ref 0.0–0.1)
EOS%: 2.8 % (ref 0.0–7.0)
HGB: 9.9 g/dL — ABNORMAL LOW (ref 11.6–15.9)
MCH: 27.8 pg (ref 25.1–34.0)
MCHC: 31.2 g/dL — ABNORMAL LOW (ref 31.5–36.0)
MCV: 89 fL (ref 79.5–101.0)
MONO%: 11.5 % (ref 0.0–14.0)
RDW: 16.2 % — ABNORMAL HIGH (ref 11.2–14.5)
lymph#: 2 10*3/uL (ref 0.9–3.3)

## 2010-03-11 ENCOUNTER — Ambulatory Visit: Payer: Self-pay | Admitting: Oncology

## 2010-03-11 LAB — CBC WITH DIFFERENTIAL/PLATELET
Basophils Absolute: 0 10*3/uL (ref 0.0–0.1)
Eosinophils Absolute: 0.2 10*3/uL (ref 0.0–0.5)
HGB: 9.9 g/dL — ABNORMAL LOW (ref 11.6–15.9)
LYMPH%: 33.4 % (ref 14.0–49.7)
MCV: 88.9 fL (ref 79.5–101.0)
MONO#: 0.7 10*3/uL (ref 0.1–0.9)
MONO%: 9.7 % (ref 0.0–14.0)
NEUT#: 3.7 10*3/uL (ref 1.5–6.5)
Platelets: 279 10*3/uL (ref 145–400)
RDW: 16.1 % — ABNORMAL HIGH (ref 11.2–14.5)
WBC: 6.9 10*3/uL (ref 3.9–10.3)

## 2010-03-25 LAB — CBC WITH DIFFERENTIAL/PLATELET
BASO%: 0.5 % (ref 0.0–2.0)
Basophils Absolute: 0 10*3/uL (ref 0.0–0.1)
HCT: 33.2 % — ABNORMAL LOW (ref 34.8–46.6)
HGB: 10.3 g/dL — ABNORMAL LOW (ref 11.6–15.9)
LYMPH%: 36.8 % (ref 14.0–49.7)
MCHC: 31 g/dL — ABNORMAL LOW (ref 31.5–36.0)
MONO#: 0.7 10*3/uL (ref 0.1–0.9)
NEUT%: 49.6 % (ref 38.4–76.8)
Platelets: 298 10*3/uL (ref 145–400)
WBC: 6.6 10*3/uL (ref 3.9–10.3)
lymph#: 2.4 10*3/uL (ref 0.9–3.3)

## 2010-04-08 LAB — CBC WITH DIFFERENTIAL/PLATELET
Eosinophils Absolute: 0.2 10*3/uL (ref 0.0–0.5)
MCV: 87.7 fL (ref 79.5–101.0)
MONO%: 9.8 % (ref 0.0–14.0)
NEUT#: 3.4 10*3/uL (ref 1.5–6.5)
RBC: 3.9 10*6/uL (ref 3.70–5.45)
RDW: 15.9 % — ABNORMAL HIGH (ref 11.2–14.5)
WBC: 7 10*3/uL (ref 3.9–10.3)
nRBC: 0 % (ref 0–0)

## 2010-04-20 ENCOUNTER — Ambulatory Visit: Payer: Self-pay | Admitting: Oncology

## 2010-04-22 LAB — CBC WITH DIFFERENTIAL/PLATELET
BASO%: 0.9 % (ref 0.0–2.0)
HCT: 33.3 % — ABNORMAL LOW (ref 34.8–46.6)
MCHC: 32.9 g/dL (ref 31.5–36.0)
MONO#: 0.9 10*3/uL (ref 0.1–0.9)
RBC: 3.87 10*6/uL (ref 3.70–5.45)
RDW: 16.8 % — ABNORMAL HIGH (ref 11.2–14.5)
WBC: 5.5 10*3/uL (ref 3.9–10.3)
lymph#: 2.1 10*3/uL (ref 0.9–3.3)

## 2010-04-27 IMAGING — CR DG CHEST 1V PORT
1 series · 1 of 1 positions shown · non-contrast
Comparison: Chest x-ray of 11/26/2003

CLINICAL DATA: Chest pain

PORTABLE CHEST - 1 VIEW

[view not recorded]
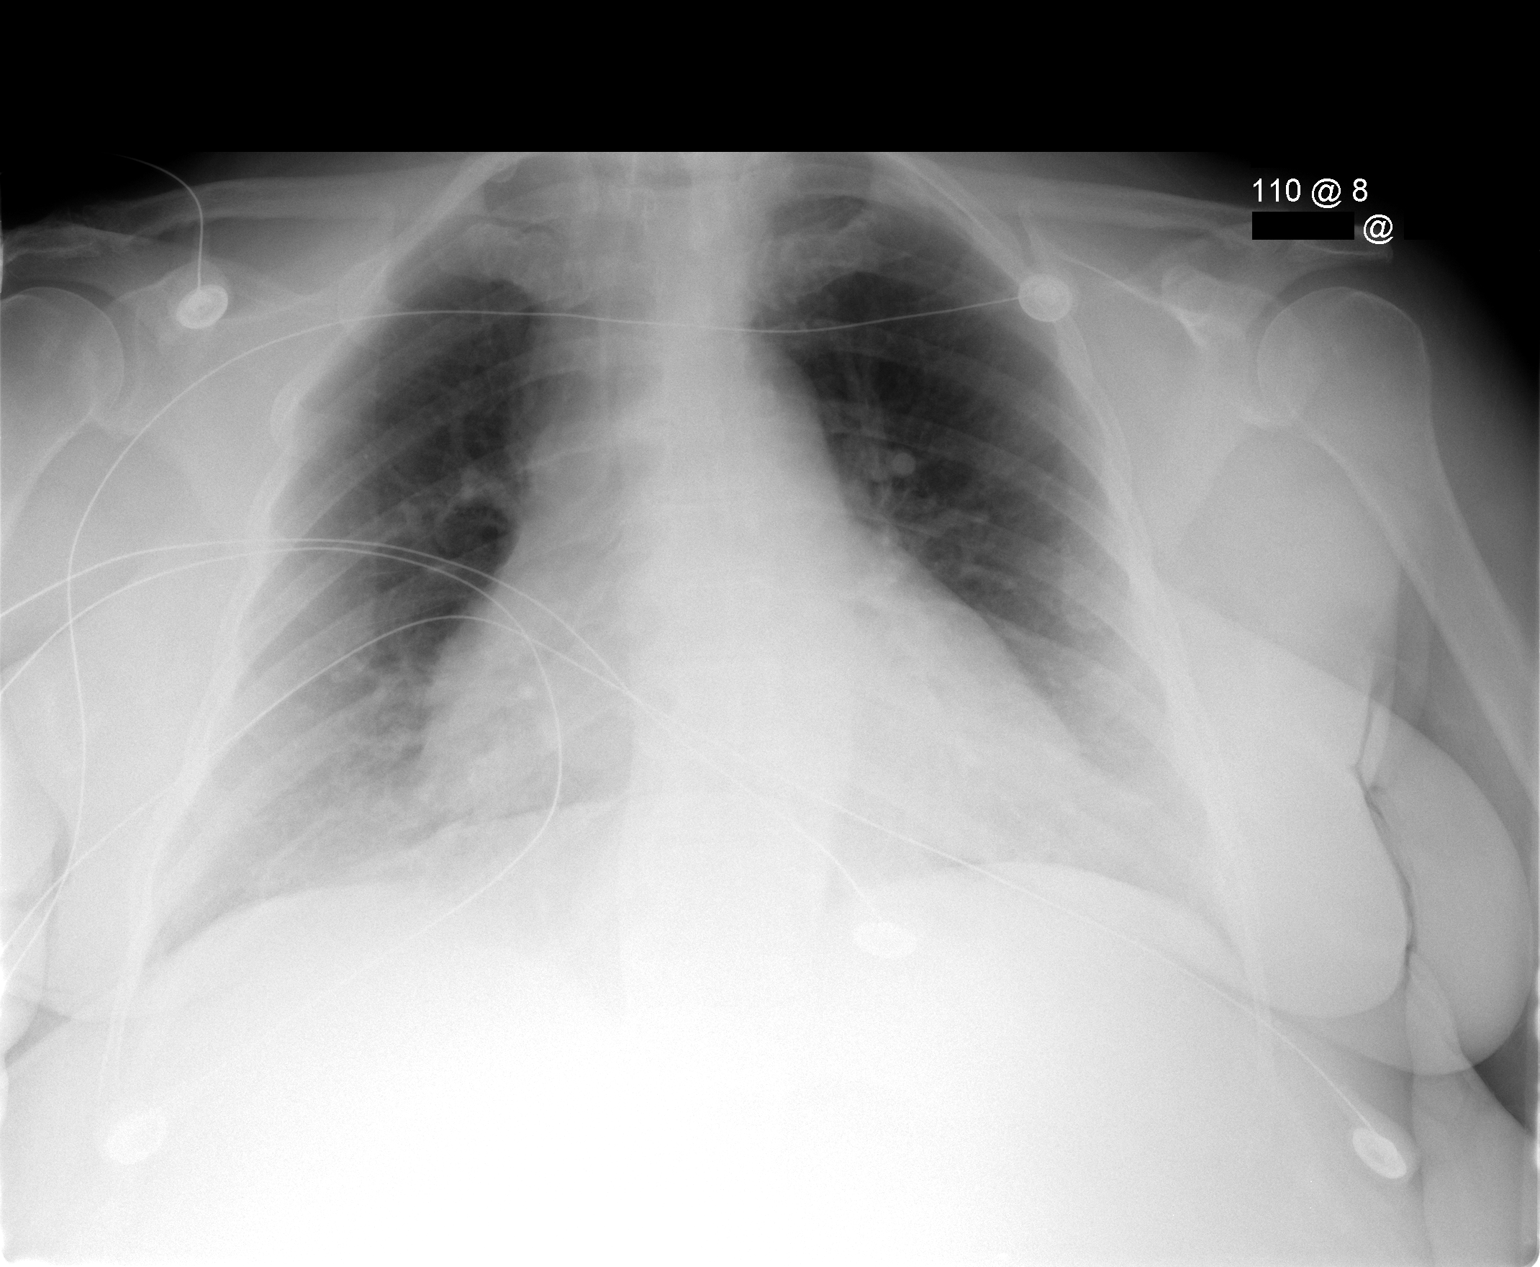

[1 of 1 positions shown; findings below may reference images not displayed]

FINDINGS: The lungs are clear.  Cardiomegaly is stable.  No active
infiltrate or effusion is noted. No bony abnormality is seen.
IMPRESSION: Stable cardiomegaly.  No active lung disease.

## 2010-05-06 LAB — CBC WITH DIFFERENTIAL/PLATELET
BASO%: 0.6 % (ref 0.0–2.0)
Basophils Absolute: 0.1 10*3/uL (ref 0.0–0.1)
EOS%: 3 % (ref 0.0–7.0)
HCT: 34.1 % — ABNORMAL LOW (ref 34.8–46.6)
HGB: 10.6 g/dL — ABNORMAL LOW (ref 11.6–15.9)
LYMPH%: 37.2 % (ref 14.0–49.7)
MCH: 27.1 pg (ref 25.1–34.0)
MCHC: 31.1 g/dL — ABNORMAL LOW (ref 31.5–36.0)
MONO#: 0.8 10*3/uL (ref 0.1–0.9)
NEUT%: 48.7 % (ref 38.4–76.8)
Platelets: 251 10*3/uL (ref 145–400)

## 2010-05-20 ENCOUNTER — Ambulatory Visit: Payer: Self-pay | Admitting: Oncology

## 2010-05-20 LAB — CBC WITH DIFFERENTIAL/PLATELET
BASO%: 0.6 % (ref 0.0–2.0)
Basophils Absolute: 0.1 10*3/uL (ref 0.0–0.1)
EOS%: 3.1 % (ref 0.0–7.0)
HCT: 36.5 % (ref 34.8–46.6)
HGB: 11.6 g/dL (ref 11.6–15.9)
MCH: 27.4 pg (ref 25.1–34.0)
MCHC: 31.8 g/dL (ref 31.5–36.0)
MCV: 86.3 fL (ref 79.5–101.0)
MONO%: 11.1 % (ref 0.0–14.0)
NEUT%: 49.6 % (ref 38.4–76.8)
lymph#: 2.8 10*3/uL (ref 0.9–3.3)

## 2010-05-24 LAB — FERRITIN: Ferritin: 26 ng/mL (ref 10–291)

## 2010-05-24 LAB — COMPREHENSIVE METABOLIC PANEL
AST: 16 U/L (ref 0–37)
Alkaline Phosphatase: 70 U/L (ref 39–117)
BUN: 31 mg/dL — ABNORMAL HIGH (ref 6–23)
Creatinine, Ser: 1.58 mg/dL — ABNORMAL HIGH (ref 0.40–1.20)
Glucose, Bld: 135 mg/dL — ABNORMAL HIGH (ref 70–99)
Potassium: 4.2 mEq/L (ref 3.5–5.3)
Total Bilirubin: 0.3 mg/dL (ref 0.3–1.2)

## 2010-05-24 LAB — IRON AND TIBC: Iron: 43 ug/dL (ref 42–145)

## 2010-05-26 ENCOUNTER — Ambulatory Visit: Payer: Self-pay | Admitting: Cardiology

## 2010-06-17 LAB — CBC WITH DIFFERENTIAL/PLATELET
Eosinophils Absolute: 0.2 10*3/uL (ref 0.0–0.5)
HCT: 30.2 % — ABNORMAL LOW (ref 34.8–46.6)
HGB: 10 g/dL — ABNORMAL LOW (ref 11.6–15.9)
LYMPH%: 26.2 % (ref 14.0–49.7)
MONO#: 0.6 10*3/uL (ref 0.1–0.9)
NEUT#: 4.5 10*3/uL (ref 1.5–6.5)
NEUT%: 62.3 % (ref 38.4–76.8)
Platelets: 253 10*3/uL (ref 145–400)
WBC: 7.3 10*3/uL (ref 3.9–10.3)
lymph#: 1.9 10*3/uL (ref 0.9–3.3)

## 2010-07-13 ENCOUNTER — Ambulatory Visit: Payer: Self-pay | Admitting: Oncology

## 2010-07-15 LAB — CBC WITH DIFFERENTIAL/PLATELET
BASO%: 1.3 % (ref 0.0–2.0)
Basophils Absolute: 0.1 10*3/uL (ref 0.0–0.1)
EOS%: 3 % (ref 0.0–7.0)
Eosinophils Absolute: 0.2 10*3/uL (ref 0.0–0.5)
HCT: 31.6 % — ABNORMAL LOW (ref 34.8–46.6)
HGB: 10.5 g/dL — ABNORMAL LOW (ref 11.6–15.9)
LYMPH%: 24.4 % (ref 14.0–49.7)
MCH: 28.8 pg (ref 25.1–34.0)
MCHC: 33.3 g/dL (ref 31.5–36.0)
MCV: 86.7 fL (ref 79.5–101.0)
MONO#: 0.5 10*3/uL (ref 0.1–0.9)
MONO%: 7.3 % (ref 0.0–14.0)
NEUT#: 4.8 10*3/uL (ref 1.5–6.5)
NEUT%: 64 % (ref 38.4–76.8)
Platelets: 288 10*3/uL (ref 145–400)
RBC: 3.65 10*6/uL — ABNORMAL LOW (ref 3.70–5.45)
RDW: 19.1 % — ABNORMAL HIGH (ref 11.2–14.5)
WBC: 7.5 10*3/uL (ref 3.9–10.3)
lymph#: 1.8 10*3/uL (ref 0.9–3.3)

## 2010-07-15 LAB — IRON AND TIBC
%SAT: 36 % (ref 20–55)
Iron: 85 ug/dL (ref 42–145)
TIBC: 238 ug/dL — ABNORMAL LOW (ref 250–470)
UIBC: 153 ug/dL

## 2010-07-15 LAB — FERRITIN: Ferritin: 469 ng/mL — ABNORMAL HIGH (ref 10–291)

## 2010-07-25 ENCOUNTER — Encounter: Payer: Self-pay | Admitting: Surgery

## 2010-08-12 ENCOUNTER — Encounter (HOSPITAL_BASED_OUTPATIENT_CLINIC_OR_DEPARTMENT_OTHER): Payer: Medicare Other | Admitting: Oncology

## 2010-08-12 ENCOUNTER — Other Ambulatory Visit (HOSPITAL_COMMUNITY): Payer: Self-pay | Admitting: Oncology

## 2010-08-12 DIAGNOSIS — E538 Deficiency of other specified B group vitamins: Secondary | ICD-10-CM

## 2010-08-12 DIAGNOSIS — N289 Disorder of kidney and ureter, unspecified: Secondary | ICD-10-CM

## 2010-08-12 DIAGNOSIS — D649 Anemia, unspecified: Secondary | ICD-10-CM

## 2010-08-12 LAB — CBC WITH DIFFERENTIAL/PLATELET
Basophils Absolute: 0.1 10*3/uL (ref 0.0–0.1)
HCT: 34.8 % (ref 34.8–46.6)
HGB: 11.2 g/dL — ABNORMAL LOW (ref 11.6–15.9)
MONO#: 0.6 10*3/uL (ref 0.1–0.9)
NEUT#: 4.5 10*3/uL (ref 1.5–6.5)
NEUT%: 62.6 % (ref 38.4–76.8)
WBC: 7.2 10*3/uL (ref 3.9–10.3)
lymph#: 1.8 10*3/uL (ref 0.9–3.3)

## 2010-09-09 ENCOUNTER — Other Ambulatory Visit (HOSPITAL_COMMUNITY): Payer: Self-pay | Admitting: Oncology

## 2010-09-09 ENCOUNTER — Encounter (HOSPITAL_BASED_OUTPATIENT_CLINIC_OR_DEPARTMENT_OTHER): Payer: Medicare Other | Admitting: Oncology

## 2010-09-09 DIAGNOSIS — E538 Deficiency of other specified B group vitamins: Secondary | ICD-10-CM

## 2010-09-09 DIAGNOSIS — D649 Anemia, unspecified: Secondary | ICD-10-CM

## 2010-09-09 LAB — CBC WITH DIFFERENTIAL/PLATELET
Basophils Absolute: 0 10*3/uL (ref 0.0–0.1)
Eosinophils Absolute: 0.2 10*3/uL (ref 0.0–0.5)
HGB: 10.2 g/dL — ABNORMAL LOW (ref 11.6–15.9)
LYMPH%: 24.8 % (ref 14.0–49.7)
MCV: 90.2 fL (ref 79.5–101.0)
MONO#: 0.8 10*3/uL (ref 0.1–0.9)
MONO%: 9.5 % (ref 0.0–14.0)
NEUT#: 5.1 10*3/uL (ref 1.5–6.5)
Platelets: 267 10*3/uL (ref 145–400)
RDW: 16.5 % — ABNORMAL HIGH (ref 11.2–14.5)

## 2010-09-12 LAB — COMPREHENSIVE METABOLIC PANEL
ALT: 20 U/L (ref 0–35)
AST: 19 U/L (ref 0–37)
Alkaline Phosphatase: 96 U/L (ref 39–117)
CO2: 26 mEq/L (ref 19–32)
Creatinine, Ser: 1.26 mg/dL — ABNORMAL HIGH (ref 0.40–1.20)
Sodium: 141 mEq/L (ref 135–145)
Total Bilirubin: 0.3 mg/dL (ref 0.3–1.2)
Total Protein: 6.5 g/dL (ref 6.0–8.3)

## 2010-09-12 LAB — IRON AND TIBC
%SAT: 22 % (ref 20–55)
TIBC: 225 ug/dL — ABNORMAL LOW (ref 250–470)
UIBC: 176 ug/dL

## 2010-09-12 LAB — TRANSFERRIN RECEPTOR, SOLUABLE: Transferrin Receptor, Soluble: 1.2 mg/L (ref 0.76–1.76)

## 2010-09-12 LAB — VITAMIN B12: Vitamin B-12: 557 pg/mL (ref 211–911)

## 2010-09-12 LAB — LACTATE DEHYDROGENASE: LDH: 145 U/L (ref 94–250)

## 2010-09-19 LAB — GLUCOSE, CAPILLARY
Glucose-Capillary: 127 mg/dL — ABNORMAL HIGH (ref 70–99)
Glucose-Capillary: 183 mg/dL — ABNORMAL HIGH (ref 70–99)

## 2010-09-22 LAB — GLUCOSE, CAPILLARY: Glucose-Capillary: 131 mg/dL — ABNORMAL HIGH (ref 70–99)

## 2010-09-24 LAB — GLUCOSE, CAPILLARY: Glucose-Capillary: 131 mg/dL — ABNORMAL HIGH (ref 70–99)

## 2010-10-04 LAB — PROTIME-INR
INR: 1.11 (ref 0.00–1.49)
Prothrombin Time: 14.2 seconds (ref 11.6–15.2)

## 2010-10-04 LAB — GLUCOSE, CAPILLARY: Glucose-Capillary: 135 mg/dL — ABNORMAL HIGH (ref 70–99)

## 2010-10-05 LAB — CBC
HCT: 31.2 % — ABNORMAL LOW (ref 36.0–46.0)
Hemoglobin: 10.5 g/dL — ABNORMAL LOW (ref 12.0–15.0)
Hemoglobin: 10.8 g/dL — ABNORMAL LOW (ref 12.0–15.0)
Hemoglobin: 11 g/dL — ABNORMAL LOW (ref 12.0–15.0)
Hemoglobin: 11 g/dL — ABNORMAL LOW (ref 12.0–15.0)
MCHC: 33.5 g/dL (ref 30.0–36.0)
MCHC: 33.6 g/dL (ref 30.0–36.0)
MCHC: 33.9 g/dL (ref 30.0–36.0)
MCHC: 34 g/dL (ref 30.0–36.0)
MCV: 86.8 fL (ref 78.0–100.0)
MCV: 87.5 fL (ref 78.0–100.0)
MCV: 89.1 fL (ref 78.0–100.0)
Platelets: 132 10*3/uL — ABNORMAL LOW (ref 150–400)
Platelets: 144 10*3/uL — ABNORMAL LOW (ref 150–400)
Platelets: 158 10*3/uL (ref 150–400)
Platelets: 238 10*3/uL (ref 150–400)
RBC: 3.64 MIL/uL — ABNORMAL LOW (ref 3.87–5.11)
RBC: 3.67 MIL/uL — ABNORMAL LOW (ref 3.87–5.11)
RDW: 16 % — ABNORMAL HIGH (ref 11.5–15.5)
RDW: 16.1 % — ABNORMAL HIGH (ref 11.5–15.5)
RDW: 16.4 % — ABNORMAL HIGH (ref 11.5–15.5)
RDW: 16.5 % — ABNORMAL HIGH (ref 11.5–15.5)
WBC: 11.6 10*3/uL — ABNORMAL HIGH (ref 4.0–10.5)
WBC: 7.7 10*3/uL (ref 4.0–10.5)
WBC: 8.1 10*3/uL (ref 4.0–10.5)

## 2010-10-05 LAB — POCT I-STAT 4, (NA,K, GLUC, HGB,HCT)
Glucose, Bld: 154 mg/dL — ABNORMAL HIGH (ref 70–99)
Glucose, Bld: 156 mg/dL — ABNORMAL HIGH (ref 70–99)
Glucose, Bld: 182 mg/dL — ABNORMAL HIGH (ref 70–99)
HCT: 21 % — ABNORMAL LOW (ref 36.0–46.0)
HCT: 22 % — ABNORMAL LOW (ref 36.0–46.0)
HCT: 26 % — ABNORMAL LOW (ref 36.0–46.0)
HCT: 26 % — ABNORMAL LOW (ref 36.0–46.0)
Hemoglobin: 10.5 g/dL — ABNORMAL LOW (ref 12.0–15.0)
Hemoglobin: 7.1 g/dL — ABNORMAL LOW (ref 12.0–15.0)
Hemoglobin: 7.5 g/dL — ABNORMAL LOW (ref 12.0–15.0)
Hemoglobin: 8.8 g/dL — ABNORMAL LOW (ref 12.0–15.0)
Potassium: 3.8 mEq/L (ref 3.5–5.1)
Potassium: 3.9 mEq/L (ref 3.5–5.1)
Potassium: 4 mEq/L (ref 3.5–5.1)
Potassium: 4.4 mEq/L (ref 3.5–5.1)
Sodium: 135 mEq/L (ref 135–145)
Sodium: 138 mEq/L (ref 135–145)
Sodium: 138 mEq/L (ref 135–145)

## 2010-10-05 LAB — TSH: TSH: 0.033 u[IU]/mL — ABNORMAL LOW (ref 0.350–4.500)

## 2010-10-05 LAB — GLUCOSE, CAPILLARY
Glucose-Capillary: 104 mg/dL — ABNORMAL HIGH (ref 70–99)
Glucose-Capillary: 112 mg/dL — ABNORMAL HIGH (ref 70–99)
Glucose-Capillary: 125 mg/dL — ABNORMAL HIGH (ref 70–99)
Glucose-Capillary: 132 mg/dL — ABNORMAL HIGH (ref 70–99)
Glucose-Capillary: 137 mg/dL — ABNORMAL HIGH (ref 70–99)
Glucose-Capillary: 140 mg/dL — ABNORMAL HIGH (ref 70–99)
Glucose-Capillary: 151 mg/dL — ABNORMAL HIGH (ref 70–99)
Glucose-Capillary: 159 mg/dL — ABNORMAL HIGH (ref 70–99)
Glucose-Capillary: 160 mg/dL — ABNORMAL HIGH (ref 70–99)
Glucose-Capillary: 161 mg/dL — ABNORMAL HIGH (ref 70–99)
Glucose-Capillary: 162 mg/dL — ABNORMAL HIGH (ref 70–99)
Glucose-Capillary: 164 mg/dL — ABNORMAL HIGH (ref 70–99)
Glucose-Capillary: 182 mg/dL — ABNORMAL HIGH (ref 70–99)
Glucose-Capillary: 183 mg/dL — ABNORMAL HIGH (ref 70–99)
Glucose-Capillary: 186 mg/dL — ABNORMAL HIGH (ref 70–99)
Glucose-Capillary: 186 mg/dL — ABNORMAL HIGH (ref 70–99)
Glucose-Capillary: 192 mg/dL — ABNORMAL HIGH (ref 70–99)
Glucose-Capillary: 195 mg/dL — ABNORMAL HIGH (ref 70–99)
Glucose-Capillary: 198 mg/dL — ABNORMAL HIGH (ref 70–99)
Glucose-Capillary: 205 mg/dL — ABNORMAL HIGH (ref 70–99)
Glucose-Capillary: 222 mg/dL — ABNORMAL HIGH (ref 70–99)
Glucose-Capillary: 234 mg/dL — ABNORMAL HIGH (ref 70–99)
Glucose-Capillary: 71 mg/dL (ref 70–99)
Glucose-Capillary: 76 mg/dL (ref 70–99)
Glucose-Capillary: 89 mg/dL (ref 70–99)
Glucose-Capillary: 98 mg/dL (ref 70–99)
Glucose-Capillary: 99 mg/dL (ref 70–99)
Glucose-Capillary: 99 mg/dL (ref 70–99)

## 2010-10-05 LAB — BLOOD GAS, ARTERIAL
Bicarbonate: 25.1 mEq/L — ABNORMAL HIGH (ref 20.0–24.0)
Drawn by: 296031
FIO2: 21 %
Patient temperature: 98.6
pH, Arterial: 7.4 (ref 7.350–7.400)

## 2010-10-05 LAB — CROSSMATCH
ABO/RH(D): A POS
Antibody Screen: NEGATIVE

## 2010-10-05 LAB — POCT I-STAT 3, ART BLOOD GAS (G3+)
Acid-Base Excess: 2 mmol/L (ref 0.0–2.0)
Acid-base deficit: 5 mmol/L — ABNORMAL HIGH (ref 0.0–2.0)
Bicarbonate: 26.4 mEq/L — ABNORMAL HIGH (ref 20.0–24.0)
Bicarbonate: 26.6 mEq/L — ABNORMAL HIGH (ref 20.0–24.0)
O2 Saturation: 100 %
O2 Saturation: 100 %
O2 Saturation: 98 %
Patient temperature: 35.8
Patient temperature: 37.5
TCO2: 24 mmol/L (ref 0–100)
TCO2: 28 mmol/L (ref 0–100)
TCO2: 28 mmol/L (ref 0–100)
pCO2 arterial: 42.9 mmHg (ref 35.0–45.0)
pCO2 arterial: 43.8 mmHg (ref 35.0–45.0)
pCO2 arterial: 44.3 mmHg (ref 35.0–45.0)
pH, Arterial: 7.363 (ref 7.350–7.400)
pH, Arterial: 7.384 (ref 7.350–7.400)
pH, Arterial: 7.399 (ref 7.350–7.400)
pH, Arterial: 7.443 — ABNORMAL HIGH (ref 7.350–7.400)
pO2, Arterial: 119 mmHg — ABNORMAL HIGH (ref 80.0–100.0)
pO2, Arterial: 246 mmHg — ABNORMAL HIGH (ref 80.0–100.0)
pO2, Arterial: 350 mmHg — ABNORMAL HIGH (ref 80.0–100.0)

## 2010-10-05 LAB — MAGNESIUM: Magnesium: 1.9 mg/dL (ref 1.5–2.5)

## 2010-10-05 LAB — POCT I-STAT 3, VENOUS BLOOD GAS (G3P V)
Acid-base deficit: 1 mmol/L (ref 0.0–2.0)
O2 Saturation: 91 %
pCO2, Ven: 42.2 mmHg — ABNORMAL LOW (ref 45.0–50.0)

## 2010-10-05 LAB — LIPID PANEL
LDL Cholesterol: 116 mg/dL — ABNORMAL HIGH (ref 0–99)
Total CHOL/HDL Ratio: 4.6 RATIO
Triglycerides: 95 mg/dL (ref <150)
VLDL: 19 mg/dL (ref 0–40)

## 2010-10-05 LAB — BASIC METABOLIC PANEL
BUN: 17 mg/dL (ref 6–23)
BUN: 18 mg/dL (ref 6–23)
CO2: 26 mEq/L (ref 19–32)
CO2: 27 mEq/L (ref 19–32)
CO2: 28 mEq/L (ref 19–32)
CO2: 30 mEq/L (ref 19–32)
Calcium: 7.7 mg/dL — ABNORMAL LOW (ref 8.4–10.5)
Calcium: 7.8 mg/dL — ABNORMAL LOW (ref 8.4–10.5)
Calcium: 9 mg/dL (ref 8.4–10.5)
Chloride: 105 mEq/L (ref 96–112)
Chloride: 98 mEq/L (ref 96–112)
Creatinine, Ser: 1.23 mg/dL — ABNORMAL HIGH (ref 0.4–1.2)
Creatinine, Ser: 1.23 mg/dL — ABNORMAL HIGH (ref 0.4–1.2)
Creatinine, Ser: 1.29 mg/dL — ABNORMAL HIGH (ref 0.4–1.2)
GFR calc Af Amer: 52 mL/min — ABNORMAL LOW (ref >60)
GFR calc Af Amer: 52 mL/min — ABNORMAL LOW (ref >60)
GFR calc Af Amer: 54 mL/min — ABNORMAL LOW (ref >60)
GFR calc Af Amer: 56 mL/min — ABNORMAL LOW (ref >60)
GFR calc non Af Amer: 42 mL/min — ABNORMAL LOW (ref >60)
GFR calc non Af Amer: 43 mL/min — ABNORMAL LOW (ref >60)
GFR calc non Af Amer: 47 mL/min — ABNORMAL LOW (ref >60)
GFR calc non Af Amer: 48 mL/min — ABNORMAL LOW (ref >60)
Glucose, Bld: 116 mg/dL — ABNORMAL HIGH (ref 70–99)
Glucose, Bld: 130 mg/dL — ABNORMAL HIGH (ref 70–99)
Glucose, Bld: 169 mg/dL — ABNORMAL HIGH (ref 70–99)
Glucose, Bld: 189 mg/dL — ABNORMAL HIGH (ref 70–99)
Glucose, Bld: 237 mg/dL — ABNORMAL HIGH (ref 70–99)
Potassium: 3.9 mEq/L (ref 3.5–5.1)
Potassium: 4 mEq/L (ref 3.5–5.1)
Potassium: 4.2 mEq/L (ref 3.5–5.1)
Potassium: 4.2 mEq/L (ref 3.5–5.1)
Sodium: 134 mEq/L — ABNORMAL LOW (ref 135–145)
Sodium: 138 mEq/L (ref 135–145)
Sodium: 139 mEq/L (ref 135–145)
Sodium: 140 mEq/L (ref 135–145)
Sodium: 140 mEq/L (ref 135–145)

## 2010-10-05 LAB — BRAIN NATRIURETIC PEPTIDE: Pro B Natriuretic peptide (BNP): 156 pg/mL — ABNORMAL HIGH (ref 0.0–100.0)

## 2010-10-05 LAB — URINALYSIS, ROUTINE W REFLEX MICROSCOPIC
Bilirubin Urine: NEGATIVE
Hgb urine dipstick: NEGATIVE
Ketones, ur: NEGATIVE mg/dL
Nitrite: NEGATIVE
Protein, ur: NEGATIVE mg/dL
Specific Gravity, Urine: 1.013 (ref 1.005–1.030)
Urobilinogen, UA: 0.2 mg/dL (ref 0.0–1.0)

## 2010-10-05 LAB — MRSA PCR SCREENING: MRSA by PCR: NEGATIVE

## 2010-10-05 LAB — POCT I-STAT GLUCOSE: Operator id: 173791

## 2010-10-05 LAB — HEPARIN LEVEL (UNFRACTIONATED): Heparin Unfractionated: <0.1 IU/mL — ABNORMAL LOW (ref 0.30–0.70)

## 2010-10-05 LAB — POCT I-STAT, CHEM 8
BUN: 19 mg/dL (ref 6–23)
Calcium, Ion: 1.07 mmol/L — ABNORMAL LOW (ref 1.12–1.32)
Calcium, Ion: 1.09 mmol/L — ABNORMAL LOW (ref 1.12–1.32)
Creatinine, Ser: 1.1 mg/dL (ref 0.4–1.2)
Creatinine, Ser: 1.4 mg/dL — ABNORMAL HIGH (ref 0.4–1.2)
Glucose, Bld: 188 mg/dL — ABNORMAL HIGH (ref 70–99)
Hemoglobin: 10.5 g/dL — ABNORMAL LOW (ref 12.0–15.0)
Potassium: 4.2 mEq/L (ref 3.5–5.1)
Sodium: 137 mEq/L (ref 135–145)
TCO2: 28 mmol/L (ref 0–100)

## 2010-10-05 LAB — COMPREHENSIVE METABOLIC PANEL
Albumin: 3.2 g/dL — ABNORMAL LOW (ref 3.5–5.2)
BUN: 14 mg/dL (ref 6–23)
Calcium: 8.3 mg/dL — ABNORMAL LOW (ref 8.4–10.5)
Creatinine, Ser: 1.18 mg/dL (ref 0.4–1.2)
Total Protein: 6.8 g/dL (ref 6.0–8.3)

## 2010-10-05 LAB — HEMOGLOBIN AND HEMATOCRIT, BLOOD
HCT: 17.7 % — ABNORMAL LOW (ref 36.0–46.0)
Hemoglobin: 6.1 g/dL — CL (ref 12.0–15.0)

## 2010-10-05 LAB — CARDIAC PANEL(CRET KIN+CKTOT+MB+TROPI)
Relative Index: 2.1 (ref 0.0–2.5)
Relative Index: 2.7 — ABNORMAL HIGH (ref 0.0–2.5)
Troponin I: 0.14 ng/mL — ABNORMAL HIGH (ref 0.00–0.06)
Troponin I: 0.16 ng/mL — ABNORMAL HIGH (ref 0.00–0.06)

## 2010-10-05 LAB — DIFFERENTIAL
Lymphocytes Relative: 25 % (ref 12–46)
Lymphs Abs: 2.1 10*3/uL (ref 0.7–4.0)
Neutrophils Relative %: 60 % (ref 43–77)

## 2010-10-05 LAB — CREATININE, SERUM
Creatinine, Ser: 1.07 mg/dL (ref 0.4–1.2)
GFR calc Af Amer: 50 mL/min — ABNORMAL LOW (ref >60)

## 2010-10-05 LAB — POCT CARDIAC MARKERS
Myoglobin, poc: 391 ng/mL (ref 12–200)
Troponin i, poc: <0.05 ng/mL (ref 0.00–0.09)

## 2010-10-05 LAB — PROTIME-INR
INR: 1.49 (ref 0.00–1.49)
Prothrombin Time: 17.9 seconds — ABNORMAL HIGH (ref 11.6–15.2)
Prothrombin Time: 23.7 seconds — ABNORMAL HIGH (ref 11.6–15.2)

## 2010-10-05 LAB — HEMOGLOBIN A1C: Hgb A1c MFr Bld: 7.6 % — ABNORMAL HIGH (ref 4.6–6.1)

## 2010-10-05 LAB — URINE MICROSCOPIC-ADD ON

## 2010-10-05 LAB — ABO/RH: ABO/RH(D): A POS

## 2010-10-05 LAB — PLATELET COUNT: Platelets: 138 10*3/uL — ABNORMAL LOW (ref 150–400)

## 2010-10-05 LAB — APTT: aPTT: 31 seconds (ref 24–37)

## 2010-10-07 ENCOUNTER — Other Ambulatory Visit (HOSPITAL_COMMUNITY): Payer: Self-pay | Admitting: Oncology

## 2010-10-07 ENCOUNTER — Encounter (HOSPITAL_BASED_OUTPATIENT_CLINIC_OR_DEPARTMENT_OTHER): Payer: Medicare Other | Admitting: Oncology

## 2010-10-07 DIAGNOSIS — D649 Anemia, unspecified: Secondary | ICD-10-CM

## 2010-10-07 DIAGNOSIS — E538 Deficiency of other specified B group vitamins: Secondary | ICD-10-CM

## 2010-10-07 LAB — CBC WITH DIFFERENTIAL/PLATELET
Basophils Absolute: 0 10*3/uL (ref 0.0–0.1)
EOS%: 5.3 % (ref 0.0–7.0)
Eosinophils Absolute: 0.4 10*3/uL (ref 0.0–0.5)
HCT: 31.6 % — ABNORMAL LOW (ref 34.8–46.6)
HGB: 10.2 g/dL — ABNORMAL LOW (ref 11.6–15.9)
MCH: 29.3 pg (ref 25.1–34.0)
MCV: 90.8 fL (ref 79.5–101.0)
MONO%: 10.8 % (ref 0.0–14.0)
NEUT#: 3.9 10*3/uL (ref 1.5–6.5)
NEUT%: 51.9 % (ref 38.4–76.8)
Platelets: 250 10*3/uL (ref 145–400)
RDW: 15.1 % — ABNORMAL HIGH (ref 11.2–14.5)

## 2010-10-19 LAB — PROTIME-INR
INR: 2.1 — ABNORMAL HIGH (ref 0.00–1.49)
Prothrombin Time: 24.4 seconds — ABNORMAL HIGH (ref 11.6–15.2)

## 2010-10-25 ENCOUNTER — Other Ambulatory Visit: Payer: Self-pay | Admitting: *Deleted

## 2010-10-25 DIAGNOSIS — I10 Essential (primary) hypertension: Secondary | ICD-10-CM

## 2010-10-25 MED ORDER — METOPROLOL TARTRATE 25 MG PO TABS: 25.0000 mg | ORAL_TABLET | Freq: Two times a day (BID) | ORAL | Status: DC

## 2010-10-25 NOTE — Telephone Encounter (Signed)
Refilled metoprolol by fax

## 2010-10-28 ENCOUNTER — Telehealth: Payer: Self-pay | Admitting: Cardiology

## 2010-10-28 ENCOUNTER — Other Ambulatory Visit: Payer: Self-pay | Admitting: Cardiology

## 2010-10-28 DIAGNOSIS — I10 Essential (primary) hypertension: Secondary | ICD-10-CM

## 2010-10-28 MED ORDER — METOPROLOL TARTRATE 25 MG PO TABS: 25.0000 mg | ORAL_TABLET | Freq: Two times a day (BID) | ORAL | Status: DC

## 2010-10-28 NOTE — Telephone Encounter (Signed)
Called stating pharmacy did not receive refill yesterday on Metoprolol. Resent.

## 2010-10-28 NOTE — Telephone Encounter (Signed)
[facility] HAS NOT RECEIVED REFILL FOR METOPROLOL

## 2010-10-28 NOTE — Telephone Encounter (Signed)
Pt called she says she is out of metoprolol? She said pharmacy called several days ago to get refill Please call her and let her know

## 2010-11-04 ENCOUNTER — Other Ambulatory Visit (HOSPITAL_COMMUNITY): Payer: Self-pay | Admitting: Oncology

## 2010-11-04 ENCOUNTER — Encounter (HOSPITAL_BASED_OUTPATIENT_CLINIC_OR_DEPARTMENT_OTHER): Payer: Medicare Other | Admitting: Oncology

## 2010-11-04 DIAGNOSIS — E538 Deficiency of other specified B group vitamins: Secondary | ICD-10-CM

## 2010-11-04 DIAGNOSIS — D649 Anemia, unspecified: Secondary | ICD-10-CM

## 2010-11-04 LAB — CBC WITH DIFFERENTIAL/PLATELET
Basophils Absolute: 0 10*3/uL (ref 0.0–0.1)
Eosinophils Absolute: 0.1 10*3/uL (ref 0.0–0.5)
HCT: 35 % (ref 34.8–46.6)
HGB: 11.3 g/dL — ABNORMAL LOW (ref 11.6–15.9)
MCH: 28.8 pg (ref 25.1–34.0)
MCV: 89.1 fL (ref 79.5–101.0)
NEUT#: 3.6 10*3/uL (ref 1.5–6.5)
NEUT%: 55.5 % (ref 38.4–76.8)
RDW: 14.3 % (ref 11.2–14.5)
lymph#: 2 10*3/uL (ref 0.9–3.3)

## 2010-11-05 ENCOUNTER — Telehealth: Payer: Self-pay | Admitting: Cardiology

## 2010-11-05 NOTE — Telephone Encounter (Signed)
PT SAID WAS AT THE HOSPITAL YESTERDAY HAVING A TEST DONE AND THE TECH TOLD HER SHE HAD A BIT OF IRREGULARITY IN HER PULSE AND TO CALL HER CARDIOLOGIST. CHART PLACED IN BOX.

## 2010-11-05 NOTE — Telephone Encounter (Signed)
States had her aranesp yest and nurse told her her HR was irreg. Was concerned. Advised that she did have Atrial fib and HR would be irreg at times.  States she hasn't taken her pulse rate but she feels OK. Advised if she gets concerned  she can call us back. Does not drink caffeine or caffeine products.

## 2010-11-11 ENCOUNTER — Emergency Department (HOSPITAL_COMMUNITY): Payer: Medicare Other

## 2010-11-11 ENCOUNTER — Telehealth: Payer: Self-pay

## 2010-11-11 ENCOUNTER — Inpatient Hospital Stay (HOSPITAL_COMMUNITY)
Admission: EM | Admit: 2010-11-11 | Discharge: 2010-11-15 | DRG: 310 | Disposition: A | Discharge status: Home / Self Care | Payer: Medicare Other | Attending: Internal Medicine | Admitting: Internal Medicine

## 2010-11-11 DIAGNOSIS — D638 Anemia in other chronic diseases classified elsewhere: Secondary | ICD-10-CM | POA: Diagnosis present

## 2010-11-11 DIAGNOSIS — Z880 Allergy status to penicillin: Secondary | ICD-10-CM

## 2010-11-11 DIAGNOSIS — E1149 Type 2 diabetes mellitus with other diabetic neurological complication: Secondary | ICD-10-CM | POA: Diagnosis present

## 2010-11-11 DIAGNOSIS — R002 Palpitations: Secondary | ICD-10-CM

## 2010-11-11 DIAGNOSIS — Z7982 Long term (current) use of aspirin: Secondary | ICD-10-CM

## 2010-11-11 DIAGNOSIS — Z951 Presence of aortocoronary bypass graft: Secondary | ICD-10-CM

## 2010-11-11 DIAGNOSIS — Z7901 Long term (current) use of anticoagulants: Secondary | ICD-10-CM

## 2010-11-11 DIAGNOSIS — E785 Hyperlipidemia, unspecified: Secondary | ICD-10-CM | POA: Diagnosis present

## 2010-11-11 DIAGNOSIS — Z8673 Personal history of transient ischemic attack (TIA), and cerebral infarction without residual deficits: Secondary | ICD-10-CM

## 2010-11-11 DIAGNOSIS — I2510 Atherosclerotic heart disease of native coronary artery without angina pectoris: Secondary | ICD-10-CM | POA: Diagnosis present

## 2010-11-11 DIAGNOSIS — I10 Essential (primary) hypertension: Secondary | ICD-10-CM | POA: Diagnosis present

## 2010-11-11 DIAGNOSIS — I4891 Unspecified atrial fibrillation: Principal | ICD-10-CM | POA: Diagnosis present

## 2010-11-11 DIAGNOSIS — E1142 Type 2 diabetes mellitus with diabetic polyneuropathy: Secondary | ICD-10-CM | POA: Diagnosis present

## 2010-11-11 DIAGNOSIS — I4892 Unspecified atrial flutter: Secondary | ICD-10-CM | POA: Diagnosis present

## 2010-11-11 DIAGNOSIS — Z888 Allergy status to other drugs, medicaments and biological substances status: Secondary | ICD-10-CM

## 2010-11-11 DIAGNOSIS — E039 Hypothyroidism, unspecified: Secondary | ICD-10-CM | POA: Diagnosis present

## 2010-11-11 DIAGNOSIS — Z794 Long term (current) use of insulin: Secondary | ICD-10-CM

## 2010-11-11 LAB — BASIC METABOLIC PANEL
BUN: 21 mg/dL (ref 6–23)
Calcium: 9 mg/dL (ref 8.4–10.5)
Chloride: 98 mEq/L (ref 96–112)
Creatinine, Ser: 1.17 mg/dL (ref 0.4–1.2)
GFR calc non Af Amer: 45 mL/min — ABNORMAL LOW (ref >60)

## 2010-11-11 LAB — CK TOTAL AND CKMB (NOT AT ARMC)
CK, MB: 3.9 ng/mL (ref 0.3–4.0)
Relative Index: 1.7 (ref 0.0–2.5)
Total CK: 229 U/L — ABNORMAL HIGH (ref 7–177)

## 2010-11-11 LAB — GLUCOSE, CAPILLARY: Glucose-Capillary: 315 mg/dL — ABNORMAL HIGH (ref 70–99)

## 2010-11-11 LAB — CBC
Hemoglobin: 11 g/dL — ABNORMAL LOW (ref 12.0–15.0)
MCH: 29.8 pg (ref 26.0–34.0)
MCV: 89.7 fL (ref 78.0–100.0)
RBC: 3.69 MIL/uL — ABNORMAL LOW (ref 3.87–5.11)

## 2010-11-11 LAB — TROPONIN I: Troponin I: <0.3 ng/mL (ref <0.30)

## 2010-11-11 LAB — CARDIAC PANEL(CRET KIN+CKTOT+MB+TROPI): Relative Index: 2.1 (ref 0.0–2.5)

## 2010-11-11 LAB — DIFFERENTIAL
Lymphs Abs: 2.5 10*3/uL (ref 0.7–4.0)
Monocytes Relative: 11 % (ref 3–12)
Neutro Abs: 3.7 10*3/uL (ref 1.7–7.7)
Neutrophils Relative %: 51 % (ref 43–77)

## 2010-11-11 LAB — PRO B NATRIURETIC PEPTIDE: Pro B Natriuretic peptide (BNP): 1962 pg/mL — ABNORMAL HIGH (ref 0–450)

## 2010-11-11 NOTE — Telephone Encounter (Signed)
Pt was sent to the emergency room for evaluation per dr Theron Arista jordan's rec.

## 2010-11-12 DIAGNOSIS — I4891 Unspecified atrial fibrillation: Secondary | ICD-10-CM

## 2010-11-12 DIAGNOSIS — I059 Rheumatic mitral valve disease, unspecified: Secondary | ICD-10-CM

## 2010-11-12 HISTORY — PX: TRANSTHORACIC ECHOCARDIOGRAM: SHX275

## 2010-11-12 LAB — CARDIAC PANEL(CRET KIN+CKTOT+MB+TROPI)
CK, MB: 3.3 ng/mL (ref 0.3–4.0)
Total CK: 169 U/L (ref 7–177)

## 2010-11-12 LAB — CBC
HCT: 29.5 % — ABNORMAL LOW (ref 36.0–46.0)
Hemoglobin: 9.7 g/dL — ABNORMAL LOW (ref 12.0–15.0)
MCHC: 32.9 g/dL (ref 30.0–36.0)

## 2010-11-12 LAB — BASIC METABOLIC PANEL
BUN: 20 mg/dL (ref 6–23)
Calcium: 8.7 mg/dL (ref 8.4–10.5)
GFR calc non Af Amer: 40 mL/min — ABNORMAL LOW (ref >60)
Glucose, Bld: 270 mg/dL — ABNORMAL HIGH (ref 70–99)
Sodium: 140 mEq/L (ref 135–145)

## 2010-11-12 LAB — HEPATIC FUNCTION PANEL
ALT: 18 U/L (ref 0–35)
AST: 14 U/L (ref 0–37)
Albumin: 3.2 g/dL — ABNORMAL LOW (ref 3.5–5.2)
Alkaline Phosphatase: 96 U/L (ref 39–117)
Total Protein: 7.2 g/dL (ref 6.0–8.3)

## 2010-11-12 LAB — HEPARIN LEVEL (UNFRACTIONATED): Heparin Unfractionated: 0.53 IU/mL (ref 0.30–0.70)

## 2010-11-12 LAB — PROTIME-INR: Prothrombin Time: 22.1 seconds — ABNORMAL HIGH (ref 11.6–15.2)

## 2010-11-13 LAB — GLUCOSE, CAPILLARY
Glucose-Capillary: 289 mg/dL — ABNORMAL HIGH (ref 70–99)
Glucose-Capillary: 317 mg/dL — ABNORMAL HIGH (ref 70–99)

## 2010-11-13 LAB — CBC
Platelets: 255 10*3/uL (ref 150–400)
RBC: 3.34 MIL/uL — ABNORMAL LOW (ref 3.87–5.11)
WBC: 7.9 10*3/uL (ref 4.0–10.5)

## 2010-11-13 LAB — BASIC METABOLIC PANEL
Chloride: 100 mEq/L (ref 96–112)
GFR calc Af Amer: 49 mL/min — ABNORMAL LOW (ref >60)
GFR calc non Af Amer: 40 mL/min — ABNORMAL LOW (ref >60)
Potassium: 4 mEq/L (ref 3.5–5.1)

## 2010-11-13 LAB — HEPARIN LEVEL (UNFRACTIONATED): Heparin Unfractionated: 0.4 IU/mL (ref 0.30–0.70)

## 2010-11-13 LAB — PROTIME-INR
INR: 1.51 — ABNORMAL HIGH (ref 0.00–1.49)
Prothrombin Time: 18.4 seconds — ABNORMAL HIGH (ref 11.6–15.2)

## 2010-11-14 LAB — GLUCOSE, CAPILLARY: Glucose-Capillary: 249 mg/dL — ABNORMAL HIGH (ref 70–99)

## 2010-11-14 LAB — PROTIME-INR
INR: 1.57 — ABNORMAL HIGH (ref 0.00–1.49)
Prothrombin Time: 19 seconds — ABNORMAL HIGH (ref 11.6–15.2)

## 2010-11-15 LAB — PROTIME-INR
INR: 1.64 — ABNORMAL HIGH (ref 0.00–1.49)
Prothrombin Time: 19.6 seconds — ABNORMAL HIGH (ref 11.6–15.2)

## 2010-11-15 LAB — GLUCOSE, CAPILLARY
Glucose-Capillary: 158 mg/dL — ABNORMAL HIGH (ref 70–99)
Glucose-Capillary: 258 mg/dL — ABNORMAL HIGH (ref 70–99)

## 2010-11-15 LAB — HEPARIN LEVEL (UNFRACTIONATED): Heparin Unfractionated: 0.62 IU/mL (ref 0.30–0.70)

## 2010-11-16 NOTE — Assessment & Plan Note (Signed)
OFFICE VISIT   [name], [name]  DOB:  Feb 07, 1933                                        July 21, 2009  CHART #:  [id number]   The patient returned to my office today for followup status post  coronary artery bypass graft surgery x3 on June 11, 2009 after  presenting with a non-ST segment elevation MI.  She did have a history  of paroxysmal atrial fibrillation and had some atrial fibrillation  postoperatively and was discharged home on rate control medications and  Coumadin.  She said that she has felt fairly well at home, but she has  had 3 episodes of brief fluttering heart rhythm with a rapid rate.  She  said that she developed nausea with each of these episodes.  They did  not last very long.  She has been taking her medications as directed.  She has had no chest pain or shortness of breath.   On physical examination, her blood pressure is 100/54, pulse is 62 and  regular, and respiratory rate is 18 unlabored.  Oxygen saturation is 93%  on room air.  She looks well.  Cardiac exam shows a regular rate and  rhythm with normal heart sounds.  Her lung exam is clear.  The chest  incision is healing well and the sternum is stable.  Her leg incision is  healing well, and there is mild bilateral lower extremity edema, which  she said is chronic.   Followup chest x-ray today shows clear lung fields.  There is a small  left pleural effusion.   Her medications are:  1. Aspirin 81 mg daily.  2. Coumadin.  3. Lopressor 75 mg b.i.d.  4. Amitriptyline 25 mg at bedtime.  5. Glucovance 2.5/500 b.i.d.  6. Lantus insulin 20-25 units at bedtime.  7. Synthroid 150 mcg daily.  8. Diltiazem 180 mg daily.  9. Vitamin D3 2000 units daily.  10.Fish oil 2000 mg daily.  11.Ferrous sulfate 325 mg b.i.d.  12.Magnesium oxide 400 mg daily.  13.Zocor 40 mg daily with 80 mg on Tuesday and Thursday.  14.Protonix 40 mg daily.  15.Losartan 50 mg daily.  16.Colace  p.r.n.   IMPRESSION:  Overall, the patient is recovering fairly well following  her surgery.  She has had some brief episodes of tachycardia, which is  probably atrial fibrillation given her history of paroxysmal atrial  fibrillation.  She said that if she develops any further episodes, she  will call Dr. Swaziland to discuss it with him.  I encouraged her to  continue walking as much as possible.  I told her not to drive a car  until we are sure that her heart rhythm is stable.  I asked her not to  lift anything heavier than 10 pounds for a total of 3 months from date  of surgery, but I told her she could liberalize her activity.  She will  continue to follow up with Dr. Swaziland and Dr. Timothy Lasso and will contact me  if she develops any problems with her incision.   Evelene Croon, M.D.  Electronically Signed   BB/MEDQ  D:  07/21/2009  T:  07/21/2009  Job:  [id number]   cc:   [name], M.D.  [name], MD

## 2010-11-19 NOTE — Discharge Summary (Signed)
NAME:  [name], [name]                  ACCOUNT NO.:  [id number]   MEDICAL RECORD NO.:  [id number]                   PATIENT TYPE:  INP   LOCATION:  0455                                 FACILITY:  [facility]   PHYSICIAN:  Ollen Gross, M.D.                 DATE OF BIRTH:  15-Dec-1932   DATE OF ADMISSION:  12/03/2003  DATE OF DISCHARGE:  12/09/2003                                 DISCHARGE SUMMARY   ADMISSION DIAGNOSES:  1. Right knee osteoarthritis.  2. History of cerebrovascular accident in October, 2004.  3. Hypercholesterolemia.  4. Hypothyroidism.  5. Insulin-dependent diabetes mellitus.  6. Hiatal hernia.  7. Reflux disease.  8. Diverticular disease.  9. Hypertension.   DISCHARGE DIAGNOSES:  1. Osteoarthritis, right knee, status post right total knee replacement     arthroplasty.  2. Postoperative hemorrhagic anemia.  3. Status post transfusion without sequelae.  4. Postoperative hyponatremia, improved.   PROCEDURE:  Date of surgery was December 03, 2003.  Right total knee  arthroplasty.   SURGEON:  Ollen Gross, M.D.   ASSISTANT:  Alexzandrew L. Julien Girt, P.A.   ANESTHESIA:  General.  A postop Marcaine pain pump.   ESTIMATED BLOOD LOSS:  30 cc.   DRAINS:  Hemovac x1.   TOURNIQUET TIME:  Forty-nine minutes at 300 mmHg.   CONSULTS:  1. Medical services, Dr. Timothy Lasso and group.  2. Rehab services.   BRIEF HISTORY:  Patient is a 77 year old female with end-stage arthritis of  her right knee that has been refractory to nonoperative management.  Now  presents for a total knee arthroplasty.   LABORATORY DATA:  CBC on admission:  Hemoglobin 10.0, hematocrit 29.9, Fehr  blood cell count 6.5, differential all within normal limits.  Postop H&H  down to 7.9 and 24.0.  Given blood.  Post transfusion hemoglobin 10 and  hematocrit of 29.1.  Last noted H&H 10 and 29.3.  PT/PTT preop 12.2 and 26  respectively with INR of 0.9.  Serial pro times followed.  Last noted PT/INR  20.8 and 2.3.  Chem panel on admission:  Elevated glucose at 129.  The  remaining chem panel all within normal limits.  Serial CMETs were followed.  Sodium dropped from 140 down to 133, back up to 138.  Glucose went up from  129 to 184, back down to normal at 84.  Albumin dropped to 3.8 down to 2.4.  Urinalysis negative with the exception of small leukocyte esterase, rare  epithelials, rare bacteria.  Blood group type A+.   EKG dated April 09, 2003:  Normal sinus rhythm with right bundle branch  block.  The right bundle branch block is new since October 06, 2000, confirmed  by Dr. Jerral Bonito.   Chest x-ray, two view, on Nov 26, 2003:  No significant abnormality.   HOSPITAL COURSE:  Patient is admitted to [facility].  Taken to the  OR and underwent the above-stated procedure without complications.  The  patient tolerated the procedure well.  Was later transferred to the recovery  room and then on to the orthopedic floor for continued postoperative care.  Patient was given 24 hours of postop IV antibiotics.  Placed on home meds.  Coumadin started for DVT prophylaxis.  Placed on weightbearing as tolerated.  PT/OT were consulted.  Hemovac drain placed at the time of surgery.  Was  pulled on postop day #1 without difficulty.  Dr. Timothy Lasso was consulted postop  to assist with medical management.  Patient was started on sliding-scale  insulin and also Glucovander postop, insulin, and p.o. meds.  Patient was  placed on Lovenox and Coumadin for DVT prophylaxis.  Fluids were KVO'd on  day #1.   By day #2, pain was improving.  Patient did receive blood on day #1 because  of the low hemoglobin.  Post anesthesia hemoglobin came back up to 10.  The  pain was better.  Started getting up with physical therapy and got up and  ambulated approximately 10 feet by day #3.  Slowly progressing.  Medical  issues, along with the diabetes, were followed very closely by Dr. Timothy Lasso and  his group.  Meds were  adjusted as needed.  Dressing changes were initiated  on postop day #2.  The incision was handling well.  She was weaned over to  p.o. meds.  Slowly progressed with physical therapy, only ambulating  approximately 25 feet by day #5 and then 50 feet by day #6.   Patient had been seen by rehab services postoperatively.  Felt that the  patient would be an excellent candidate for inpatient rehab due to the slow  progression.  It was felt that the patient would be a good candidate once  the patient is stabilized from a medical standpoint.  Also postoperatively,  the hemoglobin was back up after the transfusion.  Her pain was under  control.  She had been weaned of her p.o. meds.  It was also noted a bed had  opened up on the rehab/SACU unit on December 09, 2003.  All arrangements were  made, and patient was transferred over to rehab.   DISCHARGE PLAN:  1. Discharge over to rehab on December 09, 2003.  2. For discharge diagnoses, please see above.  3. Discharge meds:  Continue current medications as per [facility], which will be     sent over with the chart.  4. Diet:  Diabetic diet.  5. Activity:  Weightbearing as tolerated.  Will continue with gait-training,     ambulation, and ADLs, as per PT/OT on the rehab services.  Total knee     protocol.  6. Follow up in two weeks from surgery or discharge from the rehab unit.   DISPOSITION:  To [facility] rehab.   CONDITION ON DISCHARGE:  Improving.     Alexzandrew L. Julien Girt, P.A.              Ollen Gross, M.D.    ALP/MEDQ  D:  01/14/2004  T:  01/14/2004  Job:  [id number]   cc:   [name], M.D.  [address]  [place]  [place] [address]  Fax: [phone]

## 2010-11-19 NOTE — Op Note (Signed)
NAME:  [name], [name]                  ACCOUNT NO.:  [id number]   MEDICAL RECORD NO.:  [id number]                   PATIENT TYPE:  INP   LOCATION:  0005                                 FACILITY:  [facility]   PHYSICIAN:  Ollen Gross, M.D.                 DATE OF BIRTH:  16-Mar-1933   DATE OF PROCEDURE:  12/03/2003  DATE OF DISCHARGE:                                 OPERATIVE REPORT   PREOPERATIVE DIAGNOSIS:  Osteoarthritis, right knee.   POSTOPERATIVE DIAGNOSIS:  Osteoarthritis, right knee.   PROCEDURE:  Right total knee arthroplasty.   SURGEON:  Gus Rankin. Aluisio, M.D.   ASSISTANT:  Avel Peace, PA-C   ANESTHESIA:  General with postop Marcaine pain pump.   ESTIMATED BLOOD LOSS:  300.   DRAIN:  Hemovac x 1.   TOURNIQUET TIME:  49 minutes at 300 mmHg.   COMPLICATIONS:  None.   CONDITION:  Stable to recovery.   BRIEF CLINICAL NOTE:  Ms. Vetsch is a 77 year old female with end-stage  osteoarthritis of her right knee with pain refractory to nonoperative  management.  She presents now for right total knee arthroplasty.   PROCEDURE IN DETAIL:  After the successful administration of general  anesthetic, a tourniquet is placed high on the right thigh and right lower  extremity prepped and draped in the usual sterile fashion.  Extremity is  wrapped in Esmarch, knee is flexed, and tourniquet inflated to 300 mmHg.  A  standard midline incision is made with a 10 blade through the subcutaneous  tissue to the level of the extensor mechanism.  A fresh blade is used to  make a medial parapatellar arthrotomy.  Then a minimal amount of soft tissue  over the proximal and medial tibia is subperiosteally elevated to the joint  line with a knife and into the semimembranosus bursa with a Cobb elevator.  We elevated tissue on the lateral aspect of the proximal tibia with  attention being paid to avoiding the patellar tendon on tibial tubercle.  Her deformity was very slight valgus with  more wear in the lateral than  medial compartment, thus we did more of a release lateral than medial.  The  patella is then subluxed, knee flexed 90 degrees, and ACL and PCL are  removed.  Drill is used to create a starting hole in the distal femur, and  canal is irrigated.  A 5-degree right valgus alignment guide is placed and  referencing off the posterior condyles, rotation is marked and a block  pinned to remove 10 mm off the distal femur.  Distal femoral resection is  made with an oscillating saw.  A sizing block is placed, and size 3 is most  appropriate.  The rotation is marked off the epicondylar axis, and the size  3 cutting block placed and anterior and posterior cuts made.   Tibia is subluxed forward, and the menisci are removed.  Extramedullary  tibial alignment guide is placed,  referencing proximally at the medial  aspect of the tibial tubercle and distally along the second metatarsal axis  and tibial crest.  The block is pinned to remove approximately 4 mm off the  more deficient lateral side.  Tibial resection is made with an oscillating  saw.  Size 2.5 is the most appropriate tibial component, and then the  proximal tibia is prepared with the modular drill and keel punch for a 2.5.  Femoral preparation is completed with the intercondylar and chamfer cuts.  The size 3 posterior stabilized femoral trial, size 2.5 mobile bearing  tibial trial, and a 10 mm posterior stabilized rotating platform insert  trial are placed.  With the 10, she is slightly hyperextended and did have a  minimal amount of varus and valgus play.  We went up to 12.5 mm trial insert  which allowed for full extension with excellent balance throughout full  range of motion.  The patella is then everted, thickness measured to be 23  mm, free-hand resection taken to 14 mm, 38 template placed, lug holes  drilled, trial patella is placed, and it tracks normally.  The osteophytes  are then removed off the  posterior femur with the femoral trial in place.  All trials are removed, and the cut bone surfaces are prepared with  pulsatile lavage.  Cement is mixed and once ready for implantation, the size  2.5 mobile bearing tibial tray, size 3 posterior stabilized femur, and 38  patella are cemented into place, and patella is held with a clamp.  Trial  12.5 insert is placed, knee held in full extension, all extruded cement  removed.  Once the cement is fully hardened, then the permanent 12.5 mm  rotating platform posterior stabilized insert is placed into the tibial  tray.  The wound is copiously irrigated with antibiotic solution and then  the extensor mechanism closed over a Hemovac drain with interrupted #1 PDS.  Tourniquet is released for a total time of 49 minutes.  Flexion against  gravity is about 100 degrees, at which point her calf is hitting her  posterior thigh.  Subcutaneous tissues are closed with interrupted 2-0  Vicryl, subcuticular with running 4-0 Monocryl.  The catheter for the  Marcaine pain pump is then placed, the pump initiated.  Steri-Strips and a  bulky sterile dressing are then applied, and she is subsequently awakened,  placed into a knee immobilizer, and transported to recovery in stable  condition.                                               Ollen Gross, M.D.    FA/MEDQ  D:  12/03/2003  T:  12/03/2003  Job:  [id number]

## 2010-11-19 NOTE — H&P (Signed)
NAME:  [name], [name]                  ACCOUNT NO.:  [id number]   MEDICAL RECORD NO.:  [id number]                   PATIENT TYPE:  INP   LOCATION:  0455                                 FACILITY:  [facility]   PHYSICIAN:  Ollen Gross, M.D.                 DATE OF BIRTH:  25-Apr-1933   DATE OF ADMISSION:  12/03/2003  DATE OF DISCHARGE:                                HISTORY & PHYSICAL   DATE OF OFFICE VISIT/HISTORY & PHYSICAL:  Nov 25, 2003.   CHIEF COMPLAINT:  Right knee pain.   HISTORY OF PRESENT ILLNESS:  The patient is a 77 year old female with  ongoing history of right knee pain. It has been an intermittent history and  has progressively gotten worse. She slipped on some ice ten years ago, had  some pain for a while, and then got better up until about two or three years  ago. Now the pain has progressively gotten worse. She has undergone an  arthroscopy back in 2003 and never really got any relief. She has been noted  to have some significant degenerative changes of the medial and lateral  compartments at the time of the arthroscopy. She has undergone cortisone and  Synvisc without much benefit. She is seen in second opinion by Dr. Lequita Halt  and found to have end-stage arthritis of the right knee with bone-on-bone in  the lateral compartment, significant narrowing of the medial compartment, as  well as bone-on-bone patellofemoral compartments. Due to the significant  findings on the right knee, it is felt she would best be served by  undergoing a knee replacement. The risks and benefits of this have been  discussed with the patient and she elects to proceed with surgery.   ALLERGIES:  PENICILLIN caused a local injection of whelps at the injection  site; also BETADINE and ADHESIVE TAPE.   CURRENT MEDICATIONS:  Aggrenox, Cozaar, Protonix, Glucovance, Actos, Lantus  and Novolog injections, insulin, amitriptyline, Synthroid, Zocor, Omega-3  oil, lax seed, multivitamin, and  calcium.   PAST MEDICAL HISTORY:  1. History of CVA in October 2004.  2. Hypercholesterolemia.  3. Hypothyroidism.  4. Diabetes.  5. Hiatal hernia.  6. Reflux disease.  7. Diverticular disease.  8. Hypertension.   PAST SURGICAL HISTORY:  1. Tonsillectomy.  2. Hysterectomy.  3. Cyst excision from fallopian tube.  4. Knee arthroscopy.   SOCIAL HISTORY:  Married, two children.  Denies use of tobacco products or  alcohol products.   FAMILY HISTORY:  Father with history of coronary thrombosis and laryngeal  cancer. Mother with history of colon cancer and multiple strokes.   REVIEW OF SYSTEMS:  GENERAL: No fever, chills, nightsweats. NEUROLOGIC: No  seizure, syncope, or paralysis. RESPIRATORY:  No shortness of breath,  productive cough, or hemoptysis. CARDIOVASCULAR: No chest pain, angina, or  orthopnea. GI: No nausea, vomiting, diarrhea, or constipation. GU: No  dysuria, hematuria, or discharge. MUSCULOSKELETAL: Pertinent to that of the  knee found in  the history of present illness.   PHYSICAL EXAMINATION:  VITAL SIGNS: Pulse 64, respirations 16, blood  pressure 130/62.  GENERAL: A 77 year old [place] female, well-nourished, well-  developed, in no acute distress. She is overweight, alert, oriented,  cooperative, and very pleasant at the time of exam.  HEENT:  Normocephalic and atraumatic. Pupils are round and reactive.  Oropharynx is clear.  NECK: Supple.  CHEST: Clear anterior and posterior chest wall.  HEART: Regular rate and rhythm.  No murmurs.  ABDOMEN: Soft, round, protuberant abdomen. Bowel sounds are present.  RECTAL/BREASTS/GENITALIA: Not done; not pertinent to the present illness.  EXTREMITIES: Significant to the right knee; the right knee shows a valgus  deformity of the right knee. Range of motion of 10-105 degrees. There is  crepitus noted on passive range of motion. No effusion, no instability.   IMPRESSION:  1. Right knee osteoarthritis.  2.  History of cerebrovascular accident, October 2004.  3. Hypercholesterolemia.  4. Hypothyroidism.  5. Insulin-dependent diabetes mellitus.  6. Hiatal hernia.  7. Reflux disease.  8. Diverticular disease.  9. Hypertension.   PLAN:  The patient will be admitted to [facility] and undergo  right total knee replacement arthroplasty. Surgery will be performed by Dr.  Ollen Gross.     Alexzandrew L. Julien Girt, P.A.              Ollen Gross, M.D.    ALP/MEDQ  D:  12/08/2003  T:  12/09/2003  Job:  [id number]   cc:   [name], M.D.  [address]  [place]  [place] [address]  Fax: [phone]   Ollen Gross, M.D.  [facility]  [address]  [address]  [place]  [place] [address]  Fax: [phone]

## 2010-11-24 ENCOUNTER — Encounter: Payer: Self-pay | Admitting: Internal Medicine

## 2010-11-24 NOTE — H&P (Signed)
NAMEDORMA, ALTMAN             ACCOUNT NO.:  [id number]  MEDICAL RECORD NO.:  [id number]           PATIENT TYPE:  I  LOCATION:  2021                         FACILITY:  [facility]  PHYSICIAN:  Bevelyn Buckles. Isaura Schiller, MDDATE OF BIRTH:  [birth year]  DATE OF ADMISSION:  11/11/2010 DATE OF DISCHARGE:                             HISTORY & PHYSICAL   PRIMARY CARDIOLOGIST:  [name], MD  PRIMARY CARE PROVIDER:  [name], MD  REASON FOR ADMISSION:  Palpitations.  PAST MEDICAL HISTORY: 1. Coronary artery disease, status post coronary artery bypass     grafting x3 in December 2010.     a.     LIMA to LAD, SVG to diagonal, SVG to the posterior      descending artery. 2. Hypertension. 3. Insulin-dependent diabetes mellitus. 4. Diabetic neuropathy. 5. Hyperlipidemia. 6. Proximal atrial fibrillation, on chronic Coumadin therapy.     a.     INRs checked at Dr. Ferd Hibbs office. 7. CVA in 2004. 8. Chronic anemia, on Aranesp. 9. Status post hysterectomy.  HISTORY OF PRESENT ILLNESS:  This is a 77 year old Caucasian female with the above-stated problem list who states she was in her usual state of health until approximately 3 days ago when she began to notice palpitations/elevated heart rates.  She was saying that this would occur for several minutes at a time and then she would go back to regular heart rate.  She denies any associated symptoms of dizziness or shortness of breath.  This morning she was at the doctor's office with her sister and began to feel the palpitations, therefore she was evaluated by the physician and noted her heart rate to the above 150. The patient was unable to be moved into an outpatient followup for today and therefore was sent to the emergency department for further evaluation.  Upon arrival, the patient's initial EKG showed atrial flutter at a rate of 158 beats per minute.  The patient denied any associated symptoms of chest pain.  The patient was given  Cardizem 10 mg IV bolus and placed on a Cardizem 5 mg per hour IV drip.  The patient converted to normal sinus rhythm prior to Cardiology evaluating the patient.  I had stopped the patient's Cardizem drip at this time and she became hypotensive with systolic pressures around 85-90.  With the patient being in normal sinus rhythm and her Cardizem being stopped, her systolics raised to low 100s.  Cardiology has been asked to admit the patient for further evaluation.  SOCIAL HISTORY:  The patient lives in [place] with her husband.  She is retired.  She denies tobacco or alcohol abuse.  FAMILY HISTORY:  Father with history of coronary thrombus and laryngeal cancer.  Mother with history of colon cancer and multiple strokes.  PAST SURGICAL HISTORY: 1. Tonsillectomy. 2. Hysterectomy. 3. Knee arthroscopy.  ALLERGIES: 1. Betadine. 2. Penicillin. 3. Motrin.  CURRENT MEDICATIONS: 1. Glucovance 2.5/500 mg 2 tablets twice daily. 2. Synthroid 136 mcg daily. 3. B12 injection monthly. 4. Amitriptyline 75 mg nightly. 5. Cozaar 50 mg daily. 6. Aspirin 81 mg daily. 7. Lantus insulin 20 units nightly. 8. Magnesium daily.  9. Metoprolol tartrate 25 mg twice daily. 10.Protonix 40 mg daily. 11.Iron twice daily. 12.Simvastatin 80 mg daily. 13.Fish oil daily. 14.Coumadin 10 mg as directed. 15.Diltiazem 180 mg daily. 16.Vitamin D 2000 units daily.  REVIEW OF SYSTEMS:  All pertinent positives and negatives as stated in the HPI.  All other systems have been reviewed and negative.  PHYSICAL EXAMINATION:  VITAL SIGNS:  Temperature 98, pulse 76-159, respirations 15, blood pressure 81-124/41-84, and O2 saturations 99% on room air. GENERAL:  This is a well-developed, well-nourished elderly female.  She is in no acute distress. HEENT:  Normal. NECK:  Supple without bruit. HEART:  Regular rate and rhythm without murmur, rub, or gallop noted. Pulses are 2+ and equal bilaterally. LUNGS:  Clear to  auscultation bilaterally without wheezes, rales, or rhonchi. ABDOMEN:  Soft, nontender, positive bowel sounds x4. EXTREMITIES:  No clubbing or cyanosis.  2+ bilateral pitting edema noted. MUSCULOSKELETAL:  No joint deformity or effusions. NEURO:  Alert and oriented x3.  Cranial nerves II-XII grossly intact.  Chest x-ray showing cardiomegaly without acute findings.  EKG initially showing atrial flutter at a rate of 178 beats per minute and right bundle-branch block.  Repeat EKG showing a normal sinus rhythm with right bundle-branch block and 76 beats per minute.  LABORATORY DATA:  WBC 7.3, hemoglobin 11, hematocrit 33.1, and platelet 257.  Sodium 133, potassium 5, chloride 98, bicarb 25, BUN 21, creatinine 1.17, and glucose 283.  Cardiac enzymes negative x1.  BNP 1962.  ASSESSMENT/PLAN:  This is a 77 year old African American female with history of diabetes mellitus, coronary artery disease, status post coronary artery bypass grafting in December 2010 as well as paroxysmal atrial fibrillation.  The patient now presents with symptomatic atrial flutter with rapid ventricular response associated with diastolic heart failure.  The patient is currently back in sinus rhythm.  The patient will be admitted for IV diuresis.  Further discussion of treatment will be discussed with Dr. Swaziland as well as our electrophysiologist, Dr. Johney Frame regarding relative atrial fibrillation versus atrial flutter burden and possibility of atrial flutter ablation or antiarrhythmics. The patient has not had a recent echocardiogram, therefore this could be checked for left ventricular function.  The patient will be continued on home medication.  With her INR being subtherapeutic, she will be placed on heparin and continued Coumadin use per Pharmacy.  Further treatment will be depended upon the above.     Leonette Monarch, PA-C   ______________________________ Bevelyn Buckles. Michi Herrmann, MD    NB/MEDQ  D:   11/11/2010  T:  11/12/2010  Job:  [id number]  Electronically Signed by Alen Blew P.A. on 11/22/2010 12:06:43 PM Electronically Signed by Arvilla Meres MD on 11/24/2010 09:39:36 PM

## 2010-11-25 ENCOUNTER — Encounter: Payer: Self-pay | Admitting: Nurse Practitioner

## 2010-11-25 NOTE — Discharge Summary (Signed)
[name], [name] NO.:  [id number]  MEDICAL RECORD NO.:  [id number]           PATIENT TYPE:  I  LOCATION:  2021                         FACILITY:  [facility]  PHYSICIAN:  [name], M.D.  DATE OF BIRTH:  September 10, 1932  DATE OF ADMISSION:  11/11/2010 DATE OF DISCHARGE:  11/15/2010                              DISCHARGE SUMMARY   PRIMARY CARDIOLOGIST:  [name], MD  PRIMARY CARE PROVIDER:  [name], MD  DISCHARGE DIAGNOSES: 1. Paroxysmal atrial fibrillation/flutter, on Coumadin therapy as well     as amiodarone. 2. Hypothyroidism, TSH low this admission, therefore Synthroid dose     decreased. 3. Insulin-dependent diabetes mellitus. 4. Coronary artery disease status post coronary artery bypass grafting     in December 2010.     a.     Left internal mammary artery to left anterior descending,      saphenous vein graft to diagonal, saphenous vein graft to      posterior descending artery.  SECONDARY DIAGNOSES: 1. Diabetic neuropathy. 2. Hyperlipidemia. 3. History of cerebrovascular accident in 2004.  ALLERGIES: 1. BETADINE. 2. PENICILLIN. 3. MOTRIN.  PROCEDURE/DIAGNOSTICS PERFORMED DURING HOSPITALIZATION: 1. Echocardiogram on Nov 12, 2010:  This was a technically limited     study as rhythm is irregular.  Ejection fraction felt to be around     50%.  Wall thickness was increased in a pattern of mild LVH.     Moderately dilated left atrium.  Mildly dilated right atrium.     Right ventricle was mildly dilated with mildly reduced systolic     function. 2. Chest x-ray on Nov 11, 2010:  This shows cardiomegaly without acute     cardiopulmonary process.  REASON FOR HOSPITALIZATION:  This is a 77 year old Caucasian female with the above-stated problem list who presented to the [facility] Emergency Department with complaints of palpitations and elevated heart rate over the last 3 days.  Initial EKG showed atrial flutter at a rate of 158 beats per  minute.  The patient was given a Cardizem 10 mg bolus and placed on 5 mg per hour drip.  The patient had converted to normal sinus rhythm prior to evaluation by Cardiology.  The patient was admitted for monitoring and electrophysiology consultation for possible ablation versus initiation of antiarrhythmic medication.  HOSPITAL COURSE:  The patient was admitted to the telemetry unit.  Dr. Johney Frame, electrophysiologist, was consulted for atrial arrhythmias.  He notes that the patient had documented atrial fibrillation as well as more regular tachycardia that could be atrial flutter versus atrial tachycardia.  Dr. Johney Frame would not advice catheter ablation as the initial strategy at this time.  There was recommended the patient be started on amiodarone therapy as well as continued on her long-term Coumadin as she does have an elevated CHADS2 score and risk for stroke. The patient was agreeable to this.  She had several episodes of atrial flutter during initiation.  The patient converted after IV Lopressor. Of note, the patient's INR was subtherapeutic on admission at 1.87, she was placed on heparin during this time.  A TSH was also obtained given the  patient's hypothyroidism.  Her TSH was low at 0.130, therefore her Synthroid has been decreased.  This will be followed as an outpatient basis with a free T4 still pending.  A 2-D echocardiogram was obtained while in the hospital.  It was a technically limited study secondary to the patient's arrhythmia.  It appeared the patient's ejection fraction was approximately 50% with a mild decreased right ventricular systolic function.  On the day of discharge, Dr. Swaziland evaluated the patient and noted her stable for home.  Her INR remained low at 1.64, therefore we will arrange for Lovenox to be taken home and at least use for the next 2 days until a recheck INR can be done at [facility].  The patient is agreeable to this.  It is also stated  that the patient will need outpatient PFTs at baseline per amiodarone, this will be scheduled with Dr. Ferd Hibbs office.  DISCHARGE LABORATORY DATA:  INR 1.64.  TSH 0.130.  PENDING LABORATORY DATA:  Free T4.  DISCHARGE MEDICATIONS: 1. Amiodarone 200 mg, the patient will take 2 tablets twice daily     until Nov 26, 2010, then 2 tablets daily to December 10, 2010, then 1     tablet daily. 2. Lovenox 100 mg subcutaneously every 12 hours. 3. Synthroid 100 mcg daily. 4. Amitriptyline 25 mg 3 tablets daily at bedtime. 5. Coumadin 10 mg Monday, Wednesday, and Saturday, and 5 mg Tuesday,     Thursday, Friday, and Sunday. 6. Enteric-coated aspirin 81 mg daily. 7. Fish oil 1000 mg daily. 8. Lasix 20 mg daily. 9. Lipitor 40 mg daily. 10.Lantus 45 units subcutaneously daily. 11.Metoprolol tartrate 50 mg 1 tablet twice daily. 12.Nexium 40 mg 1 tablet daily. 13.PreserVision 1 tablet twice daily. 14.Vitamin D3 2000 units 1 capsule daily. 15.Please stop taking diltiazem.  FOLLOWUP PLANS AND INSTRUCTIONS: 1. The patient will follow up at [facility] on Wednesday, May     16 , 2012. 2. The patient will follow up with Dr. Timothy Lasso within the next 2 weeks     for pulmonary function test, the patient will call and schedule     this appointment. 3. The patient will follow up with Dr. Swaziland in 2-3 weeks, the office     will call and schedule this appointment. 4. The patient is to increase activity slowly. 5. The patient is to continue a low-sodium, heart-healthy diet. 6. The patient is to call the office in the interim for any problems     or concerns.  DURATION OF DISCHARGE:  Greater than 30 minutes with physician and physician extender time.     Leonette Monarch, PA-C   ______________________________ [name], M.D.    NB/MEDQ  D:  11/15/2010  T:  11/16/2010  Job:  [id number]  cc:   [name], MD  Electronically Signed by Alen Blew P.A. on 11/22/2010 12:06:17  PM Electronically Signed by PETER Swaziland M.D. on 11/25/2010 11:50:59 AM

## 2010-11-26 ENCOUNTER — Ambulatory Visit (INDEPENDENT_AMBULATORY_CARE_PROVIDER_SITE_OTHER): Payer: Medicare Other | Admitting: Nurse Practitioner

## 2010-11-26 ENCOUNTER — Other Ambulatory Visit: Payer: Self-pay | Admitting: *Deleted

## 2010-11-26 ENCOUNTER — Encounter: Payer: Self-pay | Admitting: Nurse Practitioner

## 2010-11-26 VITALS — BP 110/60 | HR 52 | Ht 64.0 in | Wt 205.0 lb

## 2010-11-26 DIAGNOSIS — I2510 Atherosclerotic heart disease of native coronary artery without angina pectoris: Secondary | ICD-10-CM | POA: Insufficient documentation

## 2010-11-26 DIAGNOSIS — E039 Hypothyroidism, unspecified: Secondary | ICD-10-CM

## 2010-11-26 DIAGNOSIS — I4891 Unspecified atrial fibrillation: Secondary | ICD-10-CM | POA: Insufficient documentation

## 2010-11-26 DIAGNOSIS — Z79899 Other long term (current) drug therapy: Secondary | ICD-10-CM

## 2010-11-26 DIAGNOSIS — Z7901 Long term (current) use of anticoagulants: Secondary | ICD-10-CM

## 2010-11-26 MED ORDER — METOPROLOL TARTRATE 25 MG PO TABS: 25.0000 mg | ORAL_TABLET | Freq: Two times a day (BID) | ORAL | Status: DC

## 2010-11-26 NOTE — Progress Notes (Signed)
[name] Date of Birth: 1932-10-22   History of Present Illness: [name] is seen back today for a post hospital visit. She is seen for Dr. Swaziland. She was admitted with atrial fib/flutter with RVR. She is now on amiodarone. Ablation was not recommended as first line therapy. Her echo was technically difficult. EF was 50%. No mention of her aortic stenosis in the discharge summary. Echo said she had sclerosis and not stenosis. She needs PFT's. She was given Lovenox because of a subtherapeutic INR. This has resulted in significant bruising. She is in sinus, but her INR was only 1.5 earlier this week. It is monitored at Dr. Ferd Hibbs office. Her synthroid was cut back. She will be having a physical in a couple of weeks. Unfortunately, she doesn't feel all that great. She feels tired and puny. She has had just a fleeting episode of palpitations. She is to cut her amiodarone back tomorrow. Blood pressure has been ok at home and not low like it is here today. No chest pain.   Current Outpatient Prescriptions on File Prior to Visit  Medication Sig Dispense Refill  . amiodarone (PACERONE) 200 MG tablet Take 200 mg by mouth 2 (two) times daily. the patient will take 2 tablets twice daily until Nov 26, 2010, then 2 tablets daily to December 10, 2010, then 1 tablet daily.       Marland Kitchen amitriptyline (ELAVIL) 75 MG tablet Take 25 mg by mouth at bedtime. 3 TABLETS DAILY      . atorvastatin (LIPITOR) 40 MG tablet Take 40 mg by mouth daily.        . Cholecalciferol (VITAMIN D) 2000 UNITS tablet Take 2,000 Units by mouth daily.        . Cyanocobalamin (VITAMIN B-12 IJ) Inject as directed every 30 (thirty) days.        Marland Kitchen esomeprazole (NEXIUM) 40 MG capsule Take 40 mg by mouth daily before breakfast.        . fish oil-omega-3 fatty acids 1000 MG capsule Take by mouth daily.        . furosemide (LASIX) 20 MG tablet Take 20 mg by mouth daily.        Marland Kitchen glimepiride (AMARYL) 4 MG tablet Take 4 mg by mouth 2 (two)  times daily.        . IRON PO Take by mouth 2 (two) times daily.        Marland Kitchen levothyroxine (SYNTHROID, LEVOTHROID) 137 MCG tablet Take 100 mcg by mouth daily.       Marland Kitchen losartan (COZAAR) 50 MG tablet Take 50 mg by mouth daily.        . metoprolol tartrate (LOPRESSOR) 25 MG tablet Take 50 mg by mouth 2 (two) times daily.        . Multiple Vitamins-Minerals (PRESERVISION/LUTEIN PO) Take by mouth daily.        Marland Kitchen warfarin (COUMADIN) 10 MG tablet Take 10 mg by mouth as directed.        Marland Kitchen DISCONTD: enoxaparin (LOVENOX) 100 MG/ML SOLN Inject into the skin every 12 (twelve) hours.        Marland Kitchen DISCONTD: simvastatin (ZOCOR) 80 MG tablet Take 80 mg by mouth at bedtime.        Marland Kitchen aspirin 81 MG tablet Take 81 mg by mouth daily.        . insulin glargine (LANTUS) 100 UNIT/ML injection Inject 50 Units into the skin at bedtime.       Marland Kitchen MAGNESIUM  PO Take by mouth daily.        Marland Kitchen DISCONTD: glyBURIDE-metformin (GLUCOVANCE) 2.5-500 MG per tablet Take 2 tablets by mouth 2 (two) times daily with a meal.          Allergies  Allergen Reactions  . Betadine (Povidone Iodine)   . Motrin (Ibuprofen)   . Penicillins     Past Medical History  Diagnosis Date  . Palpitations   . Coronary artery disease   . Hypertension   . Diabetes mellitus   . Hyperlipidemia   . Diabetic neuropathy   . Atrial fibrillation     on amiodarone  . CVA (cerebral vascular accident) 2004  . Anemia     CHRONIC  . Increased heart rate   . PAF (paroxysmal atrial fibrillation)   . Hypothyroidism   . High risk medication use     amiodarone  . Aortic stenosis     Past Surgical History  Procedure Date  . Coronary artery bypass graft 06/2009    X3, LIMA to LAD, SVG to diagonal, SVG to the posterior descending artery.  . Tonsillectomy   . Knee arthroscopy   . Transthoracic echocardiogram 11/12/2010     Ejection fraction felt to be around 50%.  Wall thickness was increased in a pattern of mild LVH.  Moderately dilated left atrium.  Mildly  dilated right atrium. Right ventricle was mildly dilated with mildly reduced systolicfunction  . Cardiac catheterization 06/09/2009    inferior wall hypokinesia with ejection fraction of 55%.    History  Smoking status  . Former Smoker  Smokeless tobacco  . Former Neurosurgeon  . Quit date: 07/04/1972    History  Alcohol Use No    Family History  Problem Relation Age of Onset  . Colon cancer Mother   . Stroke Mother   . Hypertension Mother   . Cancer Father     Review of Systems: The review of systems is positive for bruising, fatigue and rare palpitations. No tremor or nausea. Seems to be tolerating her amiodarone. She notes that her blood sugars have been running high. She is on more insulin and working with Dr. Timothy Lasso in that regards.  All other systems were reviewed and are negative.  Physical Exam: BP 110/60  Pulse 52  Ht 5\' 4"  (1.626 m)  Wt 205 lb (92.987 kg)  BMI 35.19 kg/m2 Patient is very pleasant and in no acute distress.She is obese.  Skin is warm and dry. Color is normal.  HEENT is unremarkable. Normocephalic/atraumatic. PERRL. Sclera are nonicteric. Neck is supple. No masses. No JVD. Lungs are clear. Cardiac exam shows a regular rate and rhythm. She has an outflow murmur grade 2.  Abdomen is soft. Multiple bruises noted. Extremities are without edema. Gait and ROM are intact. No gross neurologic deficits noted.  LABORATORY DATA:  EKG today shows sinus bradycardia.   Assessment / Plan:

## 2010-11-26 NOTE — Assessment & Plan Note (Signed)
No chest pain reported. Will continue with her current management.

## 2010-11-26 NOTE — Assessment & Plan Note (Signed)
Her dose was cut back during her recent admission. This will need to be followed closely with the amiodarone.

## 2010-11-26 NOTE — Telephone Encounter (Signed)
Lopressor 25 mg BID refilled 

## 2010-11-26 NOTE — Assessment & Plan Note (Signed)
INR's are monitored by Dr. Ferd Hibbs office. Fortunately, she is still in sinus rhythm.

## 2010-11-26 NOTE — Assessment & Plan Note (Signed)
She is now on amiodarone. We will send her for her baseline PFT's. She will have a decrease in her dose tomorrow. I have cut the lopressor back to just 25 mg BID to help her heart rate come up. We will see her back in about 2 to 3 weeks with an EKG on return. Patient is agreeable to this plan and will call if any problems develop in the interim.

## 2010-11-26 NOTE — Patient Instructions (Addendum)
We are going to arrange for a breathing test. We need that as a baseline for your amiodarone. I want you to cut your metoprolol back to just 25 mg two times a day. We will see you back in about 2 to 3 weeks. Call for any problems in the interim. Let Dr. Timothy Lasso know if your blood sugars remain high.

## 2010-11-26 NOTE — Consult Note (Signed)
[name], [name] NO.:  [id number]  MEDICAL RECORD NO.:  [id number]           PATIENT TYPE:  I  LOCATION:  2021                         FACILITY:  [facility]  PHYSICIAN:  Hillis Range, MD       DATE OF BIRTH:  31-Mar-1933  DATE OF CONSULTATION: DATE OF DISCHARGE:                                CONSULTATION   REQUESTING PHYSICIAN:  [name], MD  REASON FOR CONSULTATION:  Atrial arrhythmias.  HISTORY OF PRESENT ILLNESS:  [name] is a pleasant 77 year old female with a history of coronary artery disease, hypertension, diabetes and atrial fibrillation who was admitted with symptomatic atrial arrhythmias.  She states that over the past 2 days she has had tachy palpitations.  She feels that her heart rate was sustained in the 160s yesterday.  She was at the doctor's office with her sister who was having a routine appointment.  She asked the physician to check her pulse and she was noted to have a heart rate above 150 beats per minute. She was therefore brought to [facility] for further evaluation.  Upon arrival, she was found to have a right bundle-branch tachycardia at 158 beats per minute which was felt to represent either atrial flutter or another supraventricular tachycardia and treated with intravenous Cardizem and admitted to the Cardiology Service for further evaluation. She reports chronic lower extremity edema with occasional shortness of breath but denies chest pain, dizziness, presyncope or syncope.  PAST MEDICAL HISTORY: 1. Coronary artery disease status post CABG x3 in December 2010. 2. Hypertension. 3. Diabetes. 4. Diabetic neuropathy. 5. Hyperlipidemia. 6. Atrial fibrillation chronically anticoagulated with Coumadin. 7. Stroke in 2004. 8. Chronic anemia. 9. Prior hysterectomy, tonsillectomy and knee arthroscopy.  MEDICATIONS:  Reviewed in the [facility].  ALLERGIES:  BETADINE, PENICILLIN, MOTRIN.  SOCIAL HISTORY:  The patient lives in  [place] with her spouse.  She denies tobacco, alcohol or drug use.  FAMILY HISTORY:  Notable for coronary artery disease.  Her mother had colon cancer and multiple strokes.  REVIEW OF SYSTEMS:  All systems are reviewed and negative except as outlined in the HPI above.  PHYSICAL EXAMINATION:  Telemetry reveals episodes of a regular tachycardia which is likely atrial flutter as well as atrial fibrillation.  Presently, she is in sinus rhythm and having salvos of atrial fibrillation as well as multifocal premature atrial contractions. VITAL SIGNS:  Blood pressure 118/44, heart rate 53, respirations 18, sats 94% on room air, afebrile. GENERAL:  The patient is a morbidly obese elderly female in no acute distress.  She is alert and oriented x3. HEENT:  Normocephalic, atraumatic.  Sclerae clear.  Conjunctivae pink. Oropharynx clear. NECK:  Supple.  JVP 9 cm. LUNGS:  Clear. HEART:  Irregularly irregular rhythm.  No murmurs, rubs or gallops. GI:  Soft, nontender, nondistended.  Positive bowel sounds. EXTREMITIES:  No clubbing or cyanosis.  She has 2+ chronic pitting edema. SKIN:  No ecchymoses or lacerations. MUSCULOSKELETAL:  No deformity or atrophy. PSYCH:  Euthymic mood.  Full affect. NEURO:  Strength and sensation are intact.  IMAGING:  Chest x-ray reveals cardiomegaly without acute findings.  EKG  upon presentation revealed a wide complex tachycardia which was a right bundle-branch tachycardia with a QRS morphology similar to sinus rhythm. This was felt to represent supraventricular tachycardia and may be atrial flutter or an atrial tachycardia with a rate of 158 beats per minute.  She also has an EKG from Nov 11, 2010, which reveals sinus rhythm at 76 beats per minute with a right bundle-branch block and a PR interval of 156 milliseconds.  LABORATORIES:  Cardiac markers are negative.  Potassium 4.1, creatinine 1.2, INR 1.92.  Hematocrit 29, platelets 256.  IMPRESSION:  Ms.  Alexander is a pleasant 77 year old female with multiple comorbidities as outlined above who now presents with symptomatic atrial arrhythmias.  She has clearly documented atrial fibrillation as well as a more regular tachycardia which may be atrial flutter but could also be an atrial tachycardia.  I think that given her multiple atrial arrhythmias that we should pursue medical therapies at this time.  I would not advise catheter ablation as our initial strategy.  An echocardiogram has been ordered and is currently pending.  I think that this will be important as we manage the patient medically.  Our antiarrhythmic options are very limited.  With her coronary artery disease, she is not a candidate for class IC medications.  Her QTc is also prolonged and therefore she is not a candidate for Tikosyn or sotalol.  Given her congestive heart failure on exam, I would not recommend Multaq.  I would therefore recommend amiodarone as our antiarrhythmic drug of choice at this time.  The patient should be continued on Coumadin long term given her elevated CHADS-2 score and risk for stroke.  I would recommend amiodarone 400 mg twice daily for 14 days and amiodarone 400 mg daily for 14 days, then amiodarone 200 mg daily thereafter.  We will increase metoprolol today to 50 mg twice daily for rate control.  I think that once her arrhythmias have been controlled at discharge would be reasonable at that time.     Hillis Range, MD     JA/MEDQ  D:  11/12/2010  T:  11/13/2010  Job:  [id number]  cc:   [name], M.D. [name], MD  Electronically Signed by Hillis Range MD on 11/26/2010 05:42:31 PM

## 2010-12-01 ENCOUNTER — Ambulatory Visit (HOSPITAL_COMMUNITY)
Admission: RE | Admit: 2010-12-01 | Discharge: 2010-12-01 | Disposition: A | Discharge status: Home / Self Care | Payer: Medicare Other | Source: Ambulatory Visit | Attending: Cardiology | Admitting: Cardiology

## 2010-12-01 DIAGNOSIS — I4891 Unspecified atrial fibrillation: Secondary | ICD-10-CM | POA: Insufficient documentation

## 2010-12-01 DIAGNOSIS — Z79899 Other long term (current) drug therapy: Secondary | ICD-10-CM | POA: Insufficient documentation

## 2010-12-01 LAB — PULMONARY FUNCTION TEST

## 2010-12-02 ENCOUNTER — Encounter (HOSPITAL_BASED_OUTPATIENT_CLINIC_OR_DEPARTMENT_OTHER): Payer: Medicare Other | Admitting: Oncology

## 2010-12-02 ENCOUNTER — Other Ambulatory Visit (HOSPITAL_COMMUNITY): Payer: Self-pay | Admitting: Oncology

## 2010-12-02 DIAGNOSIS — E538 Deficiency of other specified B group vitamins: Secondary | ICD-10-CM

## 2010-12-02 DIAGNOSIS — D649 Anemia, unspecified: Secondary | ICD-10-CM

## 2010-12-02 LAB — CBC WITH DIFFERENTIAL/PLATELET
Basophils Absolute: 0 10*3/uL (ref 0.0–0.1)
EOS%: 3.6 % (ref 0.0–7.0)
HGB: 9.6 g/dL — ABNORMAL LOW (ref 11.6–15.9)
MCH: 30.3 pg (ref 25.1–34.0)
MONO%: 11.9 % (ref 0.0–14.0)
NEUT#: 4.3 10*3/uL (ref 1.5–6.5)
RBC: 3.16 10*6/uL — ABNORMAL LOW (ref 3.70–5.45)
RDW: 15.6 % — ABNORMAL HIGH (ref 11.2–14.5)
lymph#: 1.7 10*3/uL (ref 0.9–3.3)

## 2010-12-13 ENCOUNTER — Telehealth: Payer: Self-pay | Admitting: *Deleted

## 2010-12-13 NOTE — Telephone Encounter (Signed)
Notified of PFT's results. Has office visit 6/20

## 2010-12-21 ENCOUNTER — Encounter: Payer: Self-pay | Admitting: Cardiology

## 2010-12-22 ENCOUNTER — Encounter: Payer: Self-pay | Admitting: Cardiology

## 2010-12-22 ENCOUNTER — Ambulatory Visit (INDEPENDENT_AMBULATORY_CARE_PROVIDER_SITE_OTHER): Payer: Medicare Other | Admitting: Cardiology

## 2010-12-22 VITALS — BP 136/70 | HR 62 | Wt 210.0 lb

## 2010-12-22 DIAGNOSIS — I4891 Unspecified atrial fibrillation: Secondary | ICD-10-CM

## 2010-12-22 DIAGNOSIS — I2510 Atherosclerotic heart disease of native coronary artery without angina pectoris: Secondary | ICD-10-CM

## 2010-12-22 NOTE — Patient Instructions (Signed)
Continue your current medicartions.  We will see you again in 3 months.  Get started back with your exercise program. Start slowly and increase gradually as tolerated.

## 2010-12-22 NOTE — Assessment & Plan Note (Signed)
She is not having significant anginal symptoms. She is inactive and I've recommended she resume an exercise program. She had already previously completed the phase II cardiac rehabilitation program.

## 2010-12-22 NOTE — Progress Notes (Signed)
[name] Date of Birth: 1932/12/06   History of Present Illness: [name] is seen today for followup. She states she is feeling well this week. She did not feel quite as well last week. She has noticed a couple of episodes per her heart seemed to be beating fast but reports that this only lasts 10 minutes. She still gets winded when she walks. She has not been exercising. Her leg swelling has improved. She denies any significant chest pain.  Current Outpatient Prescriptions on File Prior to Visit  Medication Sig Dispense Refill  . amiodarone (PACERONE) 200 MG tablet Take 200 mg by mouth daily. the patient will take 2 tablets twice daily until Nov 26, 2010, then 2 tablets daily to December 10, 2010, then 1 tablet daily.      Marland Kitchen amitriptyline (ELAVIL) 75 MG tablet Take 25 mg by mouth at bedtime. 3 TABLETS DAILY      . aspirin 81 MG tablet Take 81 mg by mouth daily.        Marland Kitchen atorvastatin (LIPITOR) 40 MG tablet Take 40 mg by mouth daily.        . Cholecalciferol (VITAMIN D) 2000 UNITS tablet Take 2,000 Units by mouth daily.        . Cyanocobalamin (VITAMIN B-12 IJ) Inject as directed every 30 (thirty) days.        Marland Kitchen esomeprazole (NEXIUM) 40 MG capsule Take 40 mg by mouth daily before breakfast.        . fish oil-omega-3 fatty acids 1000 MG capsule Take by mouth daily.        . furosemide (LASIX) 20 MG tablet Take 20 mg by mouth daily.        Marland Kitchen glimepiride (AMARYL) 4 MG tablet Take 4 mg by mouth 2 (two) times daily.        . insulin glargine (LANTUS) 100 UNIT/ML injection Inject 35 Units into the skin at bedtime.       . IRON PO Take by mouth 2 (two) times daily.        Marland Kitchen levothyroxine (SYNTHROID, LEVOTHROID) 137 MCG tablet Take 100 mcg by mouth daily.       Marland Kitchen losartan (COZAAR) 50 MG tablet Take 50 mg by mouth daily.        Marland Kitchen MAGNESIUM PO Take by mouth daily.        . metoprolol tartrate (LOPRESSOR) 25 MG tablet Take 1 tablet (25 mg total) by mouth 2 (two) times daily.  60 tablet  5    . Multiple Vitamins-Minerals (PRESERVISION/LUTEIN PO) Take by mouth daily.        Marland Kitchen warfarin (COUMADIN) 10 MG tablet Take 10 mg by mouth as directed.        Marland Kitchen DISCONTD: enoxaparin (LOVENOX) 100 MG/ML SOLN Inject into the skin every 12 (twelve) hours.        Marland Kitchen DISCONTD: simvastatin (ZOCOR) 80 MG tablet Take 80 mg by mouth at bedtime.          Allergies  Allergen Reactions  . Betadine (Povidone Iodine)   . Motrin (Ibuprofen)   . Penicillins     Past Medical History  Diagnosis Date  . Coronary artery disease   . Hypertension   . Diabetes mellitus   . Hyperlipidemia   . Diabetic neuropathy   . Atrial fibrillation     on amiodarone  . CVA (cerebral vascular accident) 2004  . Anemia     CHRONIC  . Hypothyroidism   . High  risk medication use     amiodarone  . Aortic stenosis     Past Surgical History  Procedure Date  . Coronary artery bypass graft 06/2009    X3, LIMA to LAD, SVG to diagonal, SVG to the posterior descending artery.  . Tonsillectomy   . Knee arthroscopy   . Transthoracic echocardiogram 11/12/2010     Ejection fraction felt to be around 50%.  Wall thickness was increased in a pattern of mild LVH.  Moderately dilated left atrium.  Mildly dilated right atrium. Right ventricle was mildly dilated with mildly reduced systolicfunction  . Cardiac catheterization 06/09/2009    inferior wall hypokinesia with ejection fraction of 55%.  . Abdominal hysterectomy     History  Smoking status  . Former Smoker  Smokeless tobacco  . Former Neurosurgeon  . Quit date: 07/04/1972    History  Alcohol Use No    Family History  Problem Relation Age of Onset  . Colon cancer Mother   . Stroke Mother   . Hypertension Mother   . Cancer Father     Review of Systems: The review of systems is positive for poor memory. She has had no nausea. Her appetite has been good. She does complain of some increased abdominal girth but no increase in lower extremity edema. All other systems were  reviewed and are negative.  Physical Exam: BP 136/70  Pulse 62  Wt 210 lb (95.255 kg) She is an obese black female in no acute distress. HEENT exam is unremarkable. She has no JVD or bruits. Lungs are clear. Cardiac exam reveals a regular rate and rhythm without gallop, murmur, or click. Abdomen is soft, obese, and nontender. She has trace ankle edema. Pedal pulses are palpable. LABORATORY DATA: Recent pulmonary function studies were normal with normal diffusion capacity.  Assessment / Plan:

## 2010-12-22 NOTE — Assessment & Plan Note (Signed)
Her atrial fibrillation and tachycardia are significantly improved on amiodarone therapy. She is now down to 200 mg daily. She is tolerating this well. She will continue with diltiazem. She will continue with her Coumadin therapy. Her thyroid studies are followed by Dr. Timothy Lasso.

## 2010-12-30 ENCOUNTER — Other Ambulatory Visit (HOSPITAL_COMMUNITY): Payer: Self-pay | Admitting: Oncology

## 2010-12-30 ENCOUNTER — Encounter (HOSPITAL_BASED_OUTPATIENT_CLINIC_OR_DEPARTMENT_OTHER): Payer: Medicare Other | Admitting: Oncology

## 2010-12-30 DIAGNOSIS — D649 Anemia, unspecified: Secondary | ICD-10-CM

## 2010-12-30 DIAGNOSIS — N289 Disorder of kidney and ureter, unspecified: Secondary | ICD-10-CM

## 2010-12-30 DIAGNOSIS — E538 Deficiency of other specified B group vitamins: Secondary | ICD-10-CM

## 2010-12-30 LAB — CBC WITH DIFFERENTIAL/PLATELET
BASO%: 0.5 % (ref 0.0–2.0)
Basophils Absolute: 0 10*3/uL (ref 0.0–0.1)
EOS%: 2.6 % (ref 0.0–7.0)
HGB: 10.2 g/dL — ABNORMAL LOW (ref 11.6–15.9)
MCH: 30.7 pg (ref 25.1–34.0)
RBC: 3.33 10*6/uL — ABNORMAL LOW (ref 3.70–5.45)
RDW: 16.1 % — ABNORMAL HIGH (ref 11.2–14.5)
lymph#: 1.6 10*3/uL (ref 0.9–3.3)

## 2011-01-04 LAB — COMPREHENSIVE METABOLIC PANEL
ALT: 19 U/L (ref 0–35)
AST: 19 U/L (ref 0–37)
Alkaline Phosphatase: 91 U/L (ref 39–117)
BUN: 25 mg/dL — ABNORMAL HIGH (ref 6–23)
Chloride: 99 mEq/L (ref 96–112)
Creatinine, Ser: 1.59 mg/dL — ABNORMAL HIGH (ref 0.50–1.10)
Total Bilirubin: 0.4 mg/dL (ref 0.3–1.2)

## 2011-01-04 LAB — IRON AND TIBC
%SAT: 26 % (ref 20–55)
TIBC: 272 ug/dL (ref 250–470)
UIBC: 201 ug/dL

## 2011-01-04 LAB — TRANSFERRIN RECEPTOR, SOLUABLE: Transferrin Receptor, Soluble: 1.84 mg/L — ABNORMAL HIGH (ref 0.76–1.76)

## 2011-01-04 LAB — FERRITIN: Ferritin: 178 ng/mL (ref 10–291)

## 2011-01-04 LAB — VITAMIN B12: Vitamin B-12: 1004 pg/mL — ABNORMAL HIGH (ref 211–911)

## 2011-01-06 ENCOUNTER — Telehealth: Payer: Self-pay | Admitting: *Deleted

## 2011-01-06 NOTE — Telephone Encounter (Signed)
Mailed claim form [employer] to [facility]: att: Claims Department; [id number];[place] [id number]

## 2011-01-07 ENCOUNTER — Encounter: Payer: Self-pay | Admitting: Cardiology

## 2011-01-20 ENCOUNTER — Encounter: Payer: Self-pay | Admitting: Podiatry

## 2011-01-27 ENCOUNTER — Encounter (HOSPITAL_BASED_OUTPATIENT_CLINIC_OR_DEPARTMENT_OTHER): Payer: Medicare Other | Admitting: Oncology

## 2011-01-27 ENCOUNTER — Other Ambulatory Visit (HOSPITAL_COMMUNITY): Payer: Self-pay | Admitting: Oncology

## 2011-01-27 DIAGNOSIS — D649 Anemia, unspecified: Secondary | ICD-10-CM

## 2011-01-27 DIAGNOSIS — E538 Deficiency of other specified B group vitamins: Secondary | ICD-10-CM

## 2011-01-27 LAB — CBC WITH DIFFERENTIAL/PLATELET
BASO%: 0.5 % (ref 0.0–2.0)
Basophils Absolute: 0 10*3/uL (ref 0.0–0.1)
EOS%: 3.4 % (ref 0.0–7.0)
HCT: 29.8 % — ABNORMAL LOW (ref 34.8–46.6)
HGB: 9.9 g/dL — ABNORMAL LOW (ref 11.6–15.9)
LYMPH%: 25.3 % (ref 14.0–49.7)
MCH: 30 pg (ref 25.1–34.0)
MCHC: 33.1 g/dL (ref 31.5–36.0)
NEUT%: 54.8 % (ref 38.4–76.8)
Platelets: 213 10*3/uL (ref 145–400)
lymph#: 1.5 10*3/uL (ref 0.9–3.3)

## 2011-03-01 ENCOUNTER — Encounter (HOSPITAL_BASED_OUTPATIENT_CLINIC_OR_DEPARTMENT_OTHER): Payer: Medicare Other | Admitting: Oncology

## 2011-03-01 ENCOUNTER — Other Ambulatory Visit (HOSPITAL_COMMUNITY): Payer: Self-pay | Admitting: Oncology

## 2011-03-01 DIAGNOSIS — E538 Deficiency of other specified B group vitamins: Secondary | ICD-10-CM

## 2011-03-01 DIAGNOSIS — D649 Anemia, unspecified: Secondary | ICD-10-CM

## 2011-03-01 LAB — CBC WITH DIFFERENTIAL/PLATELET
BASO%: 0.8 % (ref 0.0–2.0)
Eosinophils Absolute: 0.3 10*3/uL (ref 0.0–0.5)
LYMPH%: 31.6 % (ref 14.0–49.7)
MCHC: 32.3 g/dL (ref 31.5–36.0)
MONO#: 1 10*3/uL — ABNORMAL HIGH (ref 0.1–0.9)
NEUT#: 2.7 10*3/uL (ref 1.5–6.5)
Platelets: 237 10*3/uL (ref 145–400)
RBC: 3.62 10*6/uL — ABNORMAL LOW (ref 3.70–5.45)
WBC: 6 10*3/uL (ref 3.9–10.3)
lymph#: 1.9 10*3/uL (ref 0.9–3.3)
nRBC: 0 % (ref 0–0)

## 2011-03-28 ENCOUNTER — Other Ambulatory Visit (HOSPITAL_COMMUNITY): Payer: Self-pay | Admitting: Oncology

## 2011-03-28 ENCOUNTER — Encounter (HOSPITAL_BASED_OUTPATIENT_CLINIC_OR_DEPARTMENT_OTHER): Payer: Medicare Other | Admitting: Oncology

## 2011-03-28 DIAGNOSIS — I4891 Unspecified atrial fibrillation: Secondary | ICD-10-CM

## 2011-03-28 DIAGNOSIS — D638 Anemia in other chronic diseases classified elsewhere: Secondary | ICD-10-CM

## 2011-03-28 DIAGNOSIS — D649 Anemia, unspecified: Secondary | ICD-10-CM

## 2011-03-28 DIAGNOSIS — E538 Deficiency of other specified B group vitamins: Secondary | ICD-10-CM

## 2011-03-28 LAB — COMPREHENSIVE METABOLIC PANEL
ALT: 18 U/L (ref 0–35)
AST: 28 U/L (ref 0–37)
Albumin: 3.8 g/dL (ref 3.5–5.2)
Alkaline Phosphatase: 112 U/L (ref 39–117)
Glucose, Bld: 228 mg/dL — ABNORMAL HIGH (ref 70–99)
Potassium: 4.2 mEq/L (ref 3.5–5.3)
Sodium: 137 mEq/L (ref 135–145)
Total Bilirubin: 0.5 mg/dL (ref 0.3–1.2)
Total Protein: 8 g/dL (ref 6.0–8.3)

## 2011-03-28 LAB — CBC WITH DIFFERENTIAL/PLATELET
BASO%: 1.2 % (ref 0.0–2.0)
EOS%: 4.7 % (ref 0.0–7.0)
LYMPH%: 26.2 % (ref 14.0–49.7)
MCH: 29.2 pg (ref 25.1–34.0)
MCHC: 33.4 g/dL (ref 31.5–36.0)
MCV: 87.4 fL (ref 79.5–101.0)
MONO%: 15.2 % — ABNORMAL HIGH (ref 0.0–14.0)
NEUT#: 2.9 10*3/uL (ref 1.5–6.5)
Platelets: 240 10*3/uL (ref 145–400)
RBC: 3.85 10*6/uL (ref 3.70–5.45)
RDW: 15.6 % — ABNORMAL HIGH (ref 11.2–14.5)

## 2011-03-28 LAB — IRON AND TIBC: TIBC: 313 ug/dL (ref 250–470)

## 2011-03-28 LAB — FERRITIN: Ferritin: 100 ng/mL (ref 10–291)

## 2011-03-30 ENCOUNTER — Encounter: Payer: Self-pay | Admitting: Cardiology

## 2011-03-30 ENCOUNTER — Ambulatory Visit (INDEPENDENT_AMBULATORY_CARE_PROVIDER_SITE_OTHER): Payer: Medicare Other | Admitting: Cardiology

## 2011-03-30 ENCOUNTER — Telehealth: Payer: Self-pay | Admitting: Cardiology

## 2011-03-30 VITALS — BP 116/80 | HR 136 | Ht 63.0 in | Wt 217.0 lb

## 2011-03-30 DIAGNOSIS — I4891 Unspecified atrial fibrillation: Secondary | ICD-10-CM

## 2011-03-30 DIAGNOSIS — I4892 Unspecified atrial flutter: Secondary | ICD-10-CM

## 2011-03-30 DIAGNOSIS — I2510 Atherosclerotic heart disease of native coronary artery without angina pectoris: Secondary | ICD-10-CM

## 2011-03-30 DIAGNOSIS — Z7901 Long term (current) use of anticoagulants: Secondary | ICD-10-CM

## 2011-03-30 MED ORDER — AMIODARONE HCL 200 MG PO TABS: 200.0000 mg | ORAL_TABLET | ORAL | Status: DC

## 2011-03-30 MED ORDER — DILTIAZEM HCL ER COATED BEADS 180 MG PO CP24: 180.0000 mg | ORAL_CAPSULE | Freq: Every day | ORAL | Status: DC

## 2011-03-30 MED ORDER — METOPROLOL TARTRATE 25 MG PO TABS: 50.0000 mg | ORAL_TABLET | Freq: Two times a day (BID) | ORAL | Status: DC

## 2011-03-30 NOTE — Telephone Encounter (Signed)
Pt's daughter is calling wants to know if ok for her mother to drive

## 2011-03-30 NOTE — Patient Instructions (Signed)
We will increase your metoprolol to 50 mg twice a day.  We will add diltiazem 180 mg daily for heart rate control.  We will resume amiodarone 400 mg twice a day for 14 days then 400 mg daily.  We will have you return in 2 weeks to see Lawson Fiscal.

## 2011-03-31 NOTE — Telephone Encounter (Signed)
Daughter called wanting to know if [name] could drive. Per Dr. Swaziland advised to wait at least 1 wk to give medication time to slow heart rate down. Since she was in Atrial flutter w/hr of 130 when in office yesterday. Advised if Hr does not come down she should notify us.

## 2011-03-31 NOTE — Assessment & Plan Note (Signed)
She has no recurrent anginal symptoms. We will continue with her other cardiac therapy.

## 2011-03-31 NOTE — Assessment & Plan Note (Signed)
Resuming amiodarone may affect her Coumadin dosing. She has a followup INR in 2 weeks.

## 2011-03-31 NOTE — Assessment & Plan Note (Addendum)
She has recurrent atrial flutter versus an atrial tachycardia with a rate of 136 beats per minute. I do not know why she stopped her medication. We will reload with amiodarone 400 mg twice a day for 2 weeks and then 400 mg daily. We will resume her diltiazem to 180 mg per day. I'll increase her metoprolol to 50 mg twice a day. Her daughter was with her today and I reinforced the need to take her medications as prescribed and gave her written instructions. She will followup again in 2 weeks.

## 2011-03-31 NOTE — Progress Notes (Signed)
[name] Date of Birth: [birth year]   History of Present Illness: [name] is seen today for followup. She is status post CABG in December of 2010. This included an LIMA graft to the LAD and a saphenous vein graft to the diagonal branch. Shows a saphenous vein graft to the PDA. She had recurrent atrial fibrillation/flutter. She was loaded with amiodarone in May of this year. She is actually doing very well on this therapy but for some reason she stopped both her amiodarone and her diltiazem. Over the past 2 days it has been noted that her pulse rate was elevated. She does feel more short of breath. She hasn't had any chest pain. She denies any syncope. 3 days ago she did get up in the middle the night somewhat confused and did lose her balance and fell on her right side. She notes that her balance isn't as good. She is still taking her Coumadin.  Current Outpatient Prescriptions on File Prior to Visit  Medication Sig Dispense Refill  . amitriptyline (ELAVIL) 75 MG tablet Take 25 mg by mouth at bedtime. 3 TABLETS DAILY      . aspirin 81 MG tablet Take 81 mg by mouth daily.        Marland Kitchen atorvastatin (LIPITOR) 40 MG tablet Take 40 mg by mouth daily.        . Cholecalciferol (VITAMIN D) 2000 UNITS tablet Take 2,000 Units by mouth daily.        . Cyanocobalamin (VITAMIN B-12 IJ) Inject as directed every 30 (thirty) days.        Marland Kitchen esomeprazole (NEXIUM) 40 MG capsule Take 40 mg by mouth daily before breakfast.        . fish oil-omega-3 fatty acids 1000 MG capsule Take by mouth daily.        . furosemide (LASIX) 20 MG tablet Take 20 mg by mouth daily.        Marland Kitchen glimepiride (AMARYL) 4 MG tablet Take 4 mg by mouth 2 (two) times daily.        . insulin glargine (LANTUS) 100 UNIT/ML injection Inject 35 Units into the skin at bedtime.       . IRON PO Take by mouth 2 (two) times daily.        Marland Kitchen levothyroxine (SYNTHROID, LEVOTHROID) 137 MCG tablet Take 100 mcg by mouth daily.       Marland Kitchen MAGNESIUM PO  Take by mouth daily.        . metoprolol tartrate (LOPRESSOR) 25 MG tablet Take 2 tablets (50 mg total) by mouth 2 (two) times daily.  60 tablet  5  . warfarin (COUMADIN) 10 MG tablet Take 10 mg by mouth as directed.        Marland Kitchen DISCONTD: enoxaparin (LOVENOX) 100 MG/ML SOLN Inject into the skin every 12 (twelve) hours.        Marland Kitchen DISCONTD: simvastatin (ZOCOR) 80 MG tablet Take 80 mg by mouth at bedtime.          Allergies  Allergen Reactions  . Betadine (Povidone Iodine)   . Motrin (Ibuprofen)   . Penicillins     Past Medical History  Diagnosis Date  . Coronary artery disease   . Hypertension   . Diabetes mellitus   . Hyperlipidemia   . Diabetic neuropathy   . Atrial fibrillation     on amiodarone  . CVA (cerebral vascular accident) 2004  . Anemia     CHRONIC  . Hypothyroidism   .  High risk medication use     amiodarone  . Aortic stenosis     Past Surgical History  Procedure Date  . Coronary artery bypass graft 06/2009    X3, LIMA to LAD, SVG to diagonal, SVG to the posterior descending artery.  . Tonsillectomy   . Knee arthroscopy   . Transthoracic echocardiogram 11/12/2010     Ejection fraction felt to be around 50%.  Wall thickness was increased in a pattern of mild LVH.  Moderately dilated left atrium.  Mildly dilated right atrium. Right ventricle was mildly dilated with mildly reduced systolicfunction  . Cardiac catheterization 06/09/2009    inferior wall hypokinesia with ejection fraction of 55%.  . Abdominal hysterectomy     History  Smoking status  . Former Smoker  Smokeless tobacco  . Former Neurosurgeon  . Quit date: 07/04/1972    History  Alcohol Use No    Family History  Problem Relation Age of Onset  . Colon cancer Mother   . Stroke Mother   . Hypertension Mother   . Cancer Father     Review of Systems: The review of systems is positive for poor memory. She has had no nausea. Her appetite has been good. All other systems were reviewed and are  negative.  Physical Exam: BP 116/80  Pulse 136  Ht 5\' 3"  (1.6 m)  Wt 217 lb (98.431 kg)  BMI 38.44 kg/m2 She is an obese black female in no acute distress. HEENT exam is unremarkable. She is normocephalic, atraumatic. Pupils are equal round and reactive. Sclera clear. Oropharynx is clear. She has no JVD or bruits. She has no adenopathy or thyromegaly. Lungs are clear. Cardiac exam reveals a rapid and regular and rhythm without gallop, murmur, or click. Abdomen is soft, obese, and nontender. She has trace ankle edema. Pedal pulses are palpable. She is alert and oriented x3. Cranial nerves II through XII are intact. LABORATORY DATA: ECG today demonstrates atrial flutter versus an atrial tachycardia at a rate of 136 beats per minute. She has a right bundle branch block.  Assessment / Plan:

## 2011-04-01 ENCOUNTER — Telehealth: Payer: Self-pay | Admitting: Cardiology

## 2011-04-01 NOTE — Telephone Encounter (Signed)
States she has had a cough for several weeks. States she mentioned that to Dr. Swaziland when she was in office this week. He told her that should be short lived. He restarted her Amiodarone. Also c/o dizziness but she was advised not to drive due to dizzines. That also should be short term. Wants something for cough. Advised her to call Dr. Timothy Lasso

## 2011-04-01 NOTE — Telephone Encounter (Signed)
Pt is coughing a lot and didn't sleep last night is this related to her heart  Or meds please call her back

## 2011-04-06 ENCOUNTER — Ambulatory Visit (INDEPENDENT_AMBULATORY_CARE_PROVIDER_SITE_OTHER): Payer: Medicare Other | Admitting: Cardiology

## 2011-04-06 ENCOUNTER — Emergency Department (HOSPITAL_COMMUNITY): Payer: Medicare Other

## 2011-04-06 ENCOUNTER — Encounter (HOSPITAL_COMMUNITY): Payer: Self-pay | Admitting: Radiology

## 2011-04-06 ENCOUNTER — Encounter: Payer: Self-pay | Admitting: Cardiology

## 2011-04-06 ENCOUNTER — Inpatient Hospital Stay (HOSPITAL_COMMUNITY)
Admission: EM | Admit: 2011-04-06 | Discharge: 2011-04-09 | DRG: 309 | Disposition: A | Discharge status: Home / Self Care | Payer: Medicare Other | Source: Ambulatory Visit | Attending: Cardiology | Admitting: Cardiology

## 2011-04-06 DIAGNOSIS — E1149 Type 2 diabetes mellitus with other diabetic neurological complication: Secondary | ICD-10-CM | POA: Diagnosis present

## 2011-04-06 DIAGNOSIS — I4892 Unspecified atrial flutter: Secondary | ICD-10-CM

## 2011-04-06 DIAGNOSIS — R112 Nausea with vomiting, unspecified: Secondary | ICD-10-CM

## 2011-04-06 DIAGNOSIS — D649 Anemia, unspecified: Secondary | ICD-10-CM | POA: Diagnosis present

## 2011-04-06 DIAGNOSIS — E1142 Type 2 diabetes mellitus with diabetic polyneuropathy: Secondary | ICD-10-CM | POA: Diagnosis present

## 2011-04-06 DIAGNOSIS — E1169 Type 2 diabetes mellitus with other specified complication: Secondary | ICD-10-CM | POA: Diagnosis present

## 2011-04-06 DIAGNOSIS — R55 Syncope and collapse: Secondary | ICD-10-CM

## 2011-04-06 DIAGNOSIS — I129 Hypertensive chronic kidney disease with stage 1 through stage 4 chronic kidney disease, or unspecified chronic kidney disease: Secondary | ICD-10-CM | POA: Diagnosis present

## 2011-04-06 DIAGNOSIS — I509 Heart failure, unspecified: Secondary | ICD-10-CM | POA: Diagnosis present

## 2011-04-06 DIAGNOSIS — I2510 Atherosclerotic heart disease of native coronary artery without angina pectoris: Secondary | ICD-10-CM | POA: Diagnosis present

## 2011-04-06 DIAGNOSIS — Z951 Presence of aortocoronary bypass graft: Secondary | ICD-10-CM

## 2011-04-06 DIAGNOSIS — N183 Chronic kidney disease, stage 3 unspecified: Secondary | ICD-10-CM | POA: Diagnosis present

## 2011-04-06 DIAGNOSIS — Z794 Long term (current) use of insulin: Secondary | ICD-10-CM

## 2011-04-06 DIAGNOSIS — I4891 Unspecified atrial fibrillation: Secondary | ICD-10-CM

## 2011-04-06 DIAGNOSIS — Z8673 Personal history of transient ischemic attack (TIA), and cerebral infarction without residual deficits: Secondary | ICD-10-CM

## 2011-04-06 DIAGNOSIS — I5032 Chronic diastolic (congestive) heart failure: Secondary | ICD-10-CM | POA: Diagnosis present

## 2011-04-06 DIAGNOSIS — I498 Other specified cardiac arrhythmias: Principal | ICD-10-CM | POA: Diagnosis present

## 2011-04-06 LAB — CBC
Hemoglobin: 10.2 g/dL — ABNORMAL LOW (ref 12.0–15.0)
MCHC: 32.7 g/dL (ref 30.0–36.0)
RBC: 3.62 MIL/uL — ABNORMAL LOW (ref 3.87–5.11)
WBC: 5.5 10*3/uL (ref 4.0–10.5)

## 2011-04-06 LAB — POCT I-STAT TROPONIN I: Troponin i, poc: 0 ng/mL (ref 0.00–0.08)

## 2011-04-06 LAB — APTT: aPTT: 41 seconds — ABNORMAL HIGH (ref 24–37)

## 2011-04-06 LAB — URINALYSIS, ROUTINE W REFLEX MICROSCOPIC
Bilirubin Urine: NEGATIVE
Glucose, UA: 500 mg/dL — AB
Ketones, ur: NEGATIVE mg/dL
pH: 6 (ref 5.0–8.0)

## 2011-04-06 LAB — DIFFERENTIAL
Lymphocytes Relative: 19 % (ref 12–46)
Lymphs Abs: 1 10*3/uL (ref 0.7–4.0)
Monocytes Relative: 15 % — ABNORMAL HIGH (ref 3–12)
Neutro Abs: 3.5 10*3/uL (ref 1.7–7.7)
Neutrophils Relative %: 64 % (ref 43–77)

## 2011-04-06 LAB — BASIC METABOLIC PANEL
CO2: 26 mEq/L (ref 19–32)
Calcium: 8.4 mg/dL (ref 8.4–10.5)
Creatinine, Ser: 2.1 mg/dL — ABNORMAL HIGH (ref 0.50–1.10)
Glucose, Bld: 329 mg/dL — ABNORMAL HIGH (ref 70–99)

## 2011-04-06 LAB — PRO B NATRIURETIC PEPTIDE: Pro B Natriuretic peptide (BNP): 2430 pg/mL — ABNORMAL HIGH (ref 0–450)

## 2011-04-06 LAB — GLUCOSE, CAPILLARY: Glucose-Capillary: 242 mg/dL — ABNORMAL HIGH (ref 70–99)

## 2011-04-06 LAB — PROTIME-INR: Prothrombin Time: 33.4 seconds — ABNORMAL HIGH (ref 11.6–15.2)

## 2011-04-06 NOTE — Assessment & Plan Note (Signed)
Patient has converted to sinus rhythm but she is having periods of bradycardia with syncope, nausea vomiting, and diarrhea. Some of this may be related to her amiodarone. Given her acutely ill state we will admit her emergently to the hospital for continued telemetry monitoring. We will hold her amiodarone for now but will consider resuming this at a lower dose once her condition has stabilized. She will remain on Coumadin.

## 2011-04-06 NOTE — Progress Notes (Signed)
[name] Date of Birth: April 14, 1933   History of Present Illness: [name] is seen today for a work in visit. She was just seen on September 26 with recurrent atrial flutter with a rapid ventricular response. She was placed on amiodarone and increased metoprolol. She returns today not feeling well. She had a syncopal episode witnessed in her office. She is bradycardic. She is having nausea and vomiting and spontaneous diarrhea. ECG demonstrates sinus rhythm with question of intermittent heart block. She denies any chest pain or shortness of breath. She states she just feels her chest is hot and then she becomes lightheaded. She does have periods where she feels her heart is racing.  Current Outpatient Prescriptions on File Prior to Visit  Medication Sig Dispense Refill  . amiodarone (PACERONE) 200 MG tablet Take 1 tablet (200 mg total) by mouth as directed.  100 tablet  6  . amitriptyline (ELAVIL) 75 MG tablet Take 25 mg by mouth at bedtime. 3 TABLETS DAILY      . aspirin 81 MG tablet Take 81 mg by mouth daily.        Marland Kitchen atorvastatin (LIPITOR) 40 MG tablet Take 40 mg by mouth daily.        . Cholecalciferol (VITAMIN D) 2000 UNITS tablet Take 2,000 Units by mouth daily.        . Cyanocobalamin (VITAMIN B-12 IJ) Inject as directed every 30 (thirty) days.        . darbepoetin (ARANESP) 300 MCG/0.6ML SOLN Inject 300 mcg into the skin every 30 (thirty) days.        Marland Kitchen diltiazem (CARDIZEM CD) 180 MG 24 hr capsule Take 1 capsule (180 mg total) by mouth daily.  30 capsule  11  . esomeprazole (NEXIUM) 40 MG capsule Take 40 mg by mouth daily before breakfast.        . fish oil-omega-3 fatty acids 1000 MG capsule Take by mouth daily.        . furosemide (LASIX) 20 MG tablet Take 20 mg by mouth daily.        Marland Kitchen glimepiride (AMARYL) 4 MG tablet Take 4 mg by mouth 2 (two) times daily.        Marland Kitchen HYDROcodone-homatropine (HYCODAN) 5-1.5 MG/5ML syrup Take by mouth every 6 (six) hours as needed.         . insulin glargine (LANTUS) 100 UNIT/ML injection Inject 35 Units into the skin at bedtime.       . insulin lispro (HUMALOG) 100 UNIT/ML injection Inject into the skin.        . IRON PO Take by mouth 2 (two) times daily.        Marland Kitchen levothyroxine (SYNTHROID, LEVOTHROID) 137 MCG tablet Take 100 mcg by mouth daily.       Marland Kitchen MAGNESIUM PO Take by mouth daily.        . metoprolol tartrate (LOPRESSOR) 25 MG tablet Take 2 tablets (50 mg total) by mouth 2 (two) times daily.  60 tablet  5  . warfarin (COUMADIN) 10 MG tablet Take 10 mg by mouth as directed.        Marland Kitchen DISCONTD: enoxaparin (LOVENOX) 100 MG/ML SOLN Inject into the skin every 12 (twelve) hours.        Marland Kitchen DISCONTD: simvastatin (ZOCOR) 80 MG tablet Take 80 mg by mouth at bedtime.          Allergies  Allergen Reactions  . Betadine (Povidone Iodine)   . Motrin (Ibuprofen)   .  Penicillins     Past Medical History  Diagnosis Date  . Coronary artery disease   . Hypertension   . Diabetes mellitus   . Hyperlipidemia   . Diabetic neuropathy   . Atrial fibrillation     on amiodarone  . CVA (cerebral vascular accident) 2004  . Anemia     CHRONIC  . Hypothyroidism   . High risk medication use     amiodarone  . Aortic stenosis     Past Surgical History  Procedure Date  . Coronary artery bypass graft 06/2009    X3, LIMA to LAD, SVG to diagonal, SVG to the posterior descending artery.  . Tonsillectomy   . Knee arthroscopy   . Transthoracic echocardiogram 11/12/2010     Ejection fraction felt to be around 50%.  Wall thickness was increased in a pattern of mild LVH.  Moderately dilated left atrium.  Mildly dilated right atrium. Right ventricle was mildly dilated with mildly reduced systolicfunction  . Cardiac catheterization 06/09/2009    inferior wall hypokinesia with ejection fraction of 55%.  . Abdominal hysterectomy     History  Smoking status  . Former Smoker  Smokeless tobacco  . Former Neurosurgeon  . Quit date: 07/04/1972     History  Alcohol Use No    Family History  Problem Relation Age of Onset  . Colon cancer Mother   . Stroke Mother   . Hypertension Mother   . Cancer Father     Review of Systems: The review of systems is positive for poor memory.  Her appetite has been poor. All other systems were reviewed and are negative.  Physical Exam: BP 126/58  Pulse 60  Ht 5\' 3"  (1.6 m)  Wt 229 lb 12.8 oz (104.237 kg)  BMI 40.71 kg/m2  SpO2 95% She is an obese black female who appears acutely ill. She is mildly diaphoretic. She is pale.Marland Kitchen HEENT exam is unremarkable. She is normocephalic, atraumatic. Pupils are equal round and reactive. Sclera clear. Oropharynx is clear. She has no JVD or bruits. She has no adenopathy or thyromegaly. Lungs are clear. Cardiac exam reveals a slow and irregular  rhythm without gallop, murmur, or click. Abdomen is soft, obese, and nontender. She has trace ankle edema. Pedal pulses are palpable. She is alert and oriented x3. Cranial nerves II through XII are intact. LABORATORY DATA: ECG today demonstrates sinus rhythm with a right bundle branch block. Tracing is of poor quality.  Assessment / Plan:

## 2011-04-07 LAB — URINE CULTURE
Culture  Setup Time: 201210031837
Culture: NO GROWTH

## 2011-04-07 LAB — GLUCOSE, CAPILLARY: Glucose-Capillary: 50 mg/dL — ABNORMAL LOW (ref 70–99)

## 2011-04-07 LAB — PROTIME-INR: INR: 2.94 — ABNORMAL HIGH (ref 0.00–1.49)

## 2011-04-08 LAB — BASIC METABOLIC PANEL
BUN: 19 mg/dL (ref 6–23)
CO2: 28 mEq/L (ref 19–32)
Chloride: 107 mEq/L (ref 96–112)
Creatinine, Ser: 1.48 mg/dL — ABNORMAL HIGH (ref 0.50–1.10)

## 2011-04-08 LAB — PROTIME-INR
INR: 2.49 — ABNORMAL HIGH (ref 0.00–1.49)
Prothrombin Time: 27.3 seconds — ABNORMAL HIGH (ref 11.6–15.2)

## 2011-04-08 LAB — GLUCOSE, CAPILLARY
Glucose-Capillary: 57 mg/dL — ABNORMAL LOW (ref 70–99)
Glucose-Capillary: 96 mg/dL (ref 70–99)
Glucose-Capillary: 158 mg/dL — ABNORMAL HIGH (ref 70–99)
Glucose-Capillary: 223 mg/dL — ABNORMAL HIGH (ref 70–99)

## 2011-04-09 LAB — BASIC METABOLIC PANEL
CO2: 24 mEq/L (ref 19–32)
Calcium: 8.6 mg/dL (ref 8.4–10.5)
Chloride: 104 mEq/L (ref 96–112)
Creatinine, Ser: 1.35 mg/dL — ABNORMAL HIGH (ref 0.50–1.10)
Glucose, Bld: 186 mg/dL — ABNORMAL HIGH (ref 70–99)

## 2011-04-09 LAB — GLUCOSE, CAPILLARY
Glucose-Capillary: 93 mg/dL (ref 70–99)
Glucose-Capillary: 173 mg/dL — ABNORMAL HIGH (ref 70–99)

## 2011-04-13 ENCOUNTER — Encounter: Payer: Self-pay | Admitting: Nurse Practitioner

## 2011-04-13 ENCOUNTER — Ambulatory Visit (INDEPENDENT_AMBULATORY_CARE_PROVIDER_SITE_OTHER): Payer: Medicare Other | Admitting: Nurse Practitioner

## 2011-04-13 VITALS — BP 114/58 | HR 60 | Ht 63.0 in | Wt 223.0 lb

## 2011-04-13 DIAGNOSIS — I4891 Unspecified atrial fibrillation: Secondary | ICD-10-CM

## 2011-04-13 DIAGNOSIS — R001 Bradycardia, unspecified: Secondary | ICD-10-CM

## 2011-04-13 DIAGNOSIS — I5189 Other ill-defined heart diseases: Secondary | ICD-10-CM | POA: Insufficient documentation

## 2011-04-13 DIAGNOSIS — I498 Other specified cardiac arrhythmias: Secondary | ICD-10-CM

## 2011-04-13 DIAGNOSIS — I2510 Atherosclerotic heart disease of native coronary artery without angina pectoris: Secondary | ICD-10-CM

## 2011-04-13 DIAGNOSIS — I5030 Unspecified diastolic (congestive) heart failure: Secondary | ICD-10-CM

## 2011-04-13 DIAGNOSIS — R55 Syncope and collapse: Secondary | ICD-10-CM

## 2011-04-13 DIAGNOSIS — Z7901 Long term (current) use of anticoagulants: Secondary | ICD-10-CM

## 2011-04-13 DIAGNOSIS — I4892 Unspecified atrial flutter: Secondary | ICD-10-CM

## 2011-04-13 MED ORDER — METOPROLOL TARTRATE 25 MG PO TABS: 25.0000 mg | ORAL_TABLET | Freq: Two times a day (BID) | ORAL | Status: DC

## 2011-04-13 NOTE — Assessment & Plan Note (Signed)
No chest pain at present. 

## 2011-04-13 NOTE — Assessment & Plan Note (Signed)
She remains in sinus. Still feels a little weak and lethargic. Some lightheadedness. I have cut the metoprolol back to just one tablet two times a day. I will see her back in 10 days. Patient is agreeable to this plan and will call if any problems develop in the interim.

## 2011-04-13 NOTE — Patient Instructions (Signed)
I want you to cut back the Metoprolol to just one tablet two times a day  Cut back the salt  I will see you in about 10 days.   Do not resume driving.

## 2011-04-13 NOTE — Assessment & Plan Note (Signed)
Monitored by [facility].

## 2011-04-13 NOTE — Progress Notes (Signed)
[name] Date of Birth: May 01, 1933   History of Present Illness: [name] is seen back today for a follow up visit. She is seen for Dr. Swaziland. She was just discharged 3 days ago after having a syncopal episode here in the office last week. She was felt to have had a vasal vagal episode. Prior to that she had been restarted on her medications for recurrent atrial flutter with RVR. This included amiodarone and metoprolol. She was bradycardic in the hospital. No further syncope. Diltiazem was stopped during the hospitalization. Her amiodarone and metoprolol were continued. Creatinine was elevated initially but back down by discharge. CT of the head showed atrophy and chronic ischemic Vanhoose matter disease. No acute infarct. CXR showed questionable mild congestion.   Today she comes in. She is a little weak. She is here with her daughter. No recurrent syncope. She gets a little lightheaded if she bends over. Some palpitations. She is sleeping more. Her daughter feels like she is too lethargic. Rhythm has been ok. She has some shortness of breath but it seems to be her baseline. She does have chronic edema. She does not watch her salt. No chest pain.   Current Outpatient Prescriptions on File Prior to Visit  Medication Sig Dispense Refill  . amiodarone (PACERONE) 200 MG tablet Take 1 tablet (200 mg total) by mouth as directed.  100 tablet  6  . amitriptyline (ELAVIL) 75 MG tablet Take 25 mg by mouth at bedtime. 3 TABLETS DAILY      . aspirin 81 MG tablet Take 81 mg by mouth daily.        Marland Kitchen atorvastatin (LIPITOR) 40 MG tablet Take 40 mg by mouth daily.        . Cholecalciferol (VITAMIN D) 2000 UNITS tablet Take 2,000 Units by mouth daily.        . Cyanocobalamin (VITAMIN B-12 IJ) Inject as directed every 30 (thirty) days.        . darbepoetin (ARANESP) 300 MCG/0.6ML SOLN Inject 300 mcg into the skin every 30 (thirty) days.        Marland Kitchen esomeprazole (NEXIUM) 40 MG capsule Take 40 mg by  mouth daily before breakfast.        . fish oil-omega-3 fatty acids 1000 MG capsule Take by mouth daily.        . furosemide (LASIX) 20 MG tablet Take 20 mg by mouth daily.        Marland Kitchen glimepiride (AMARYL) 4 MG tablet Take 4 mg by mouth 2 (two) times daily.        Marland Kitchen HYDROcodone-homatropine (HYCODAN) 5-1.5 MG/5ML syrup Take by mouth every 6 (six) hours as needed.        . insulin glargine (LANTUS) 100 UNIT/ML injection Inject 35 Units into the skin at bedtime.       . insulin lispro (HUMALOG) 100 UNIT/ML injection Inject into the skin.        . IRON PO Take by mouth 2 (two) times daily.        Marland Kitchen levothyroxine (SYNTHROID, LEVOTHROID) 137 MCG tablet Take 137 mcg by mouth daily.       Marland Kitchen MAGNESIUM PO Take by mouth daily.        Marland Kitchen warfarin (COUMADIN) 10 MG tablet Take 10 mg by mouth as directed.        Marland Kitchen DISCONTD: metoprolol tartrate (LOPRESSOR) 25 MG tablet Take 2 tablets (50 mg total) by mouth 2 (two) times daily.  60 tablet  5  . DISCONTD: enoxaparin (LOVENOX) 100 MG/ML SOLN Inject into the skin every 12 (twelve) hours.        Marland Kitchen DISCONTD: simvastatin (ZOCOR) 80 MG tablet Take 80 mg by mouth at bedtime.          Allergies  Allergen Reactions  . Betadine (Povidone Iodine)   . Capoten (Captopril)   . Darvocet (Propoxyphene N-Acetaminophen)   . Motrin (Ibuprofen)   . Penicillins     Past Medical History  Diagnosis Date  . Coronary artery disease     Severe two vessel; s/p CABG  . Hypertension   . Diabetes mellitus     Type 2  . Hyperlipidemia   . Diabetic neuropathy   . PAF (paroxysmal atrial fibrillation)     on amiodarone  . CVA (cerebral vascular accident) 2004  . Hypothyroidism   . Aortic stenosis   . Myocardial infarction 2010    Non-ST-segment elevation; s/p CABG  . Chronic anemia   . High risk medication use     Amiodarone  . Diastolic dysfunction     per echo in May of 2012    Past Surgical History  Procedure Date  . Coronary artery bypass graft 06/2009    X3, LIMA to  LAD, SVG to diagonal, SVG to the posterior descending artery.  . Tonsillectomy   . Knee arthroscopy   . Transthoracic echocardiogram 11/12/2010     Ejection fraction felt to be around 50%.  Wall thickness was increased in a pattern of mild LVH.  Moderately dilated left atrium.  Mildly dilated right atrium. Right ventricle was mildly dilated with mildly reduced systolic function  . Cardiac catheterization 06/09/2009    inferior wall hypokinesia with ejection fraction of 55%.  . Abdominal hysterectomy     History  Smoking status  . Former Smoker  Smokeless tobacco  . Former Neurosurgeon  . Quit date: 07/04/1972    History  Alcohol Use No    Family History  Problem Relation Age of Onset  . Colon cancer Mother   . Stroke Mother   . Hypertension Mother   . Cancer Father   . Heart attack Father     x2    Review of Systems: The review of systems is per the HPI.  All other systems were reviewed and are negative.  Physical Exam: BP 114/58  Pulse 60  Ht 5\' 3"  (1.6 m)  Wt 223 lb (101.152 kg)  BMI 39.50 kg/m2 Patient is very pleasant and in no acute distress. More alert today. Skin is warm and dry. Color is a little pale.  HEENT is unremarkable. Normocephalic/atraumatic. PERRL. Sclera are nonicteric. Neck is supple. No masses. No JVD. Lungs are clear. Cardiac exam shows a regular rate and rhythm. Rate is a little slow.  Abdomen is soft. Extremities are with 1+ pedal edema. Gait and ROM are intact. No gross neurologic deficits noted.  LABORATORY DATA: EKG today shows sinus rhythm, R BBB, rate is 60.    Assessment / Plan:

## 2011-04-13 NOTE — Assessment & Plan Note (Signed)
She has chronic edema. Her creatinine did bump up while in the hospital. I encouraged her to cut back her salt.

## 2011-04-21 ENCOUNTER — Encounter (HOSPITAL_BASED_OUTPATIENT_CLINIC_OR_DEPARTMENT_OTHER): Payer: Medicare Other | Admitting: Oncology

## 2011-04-21 ENCOUNTER — Other Ambulatory Visit (HOSPITAL_COMMUNITY): Payer: Self-pay | Admitting: Oncology

## 2011-04-21 DIAGNOSIS — E538 Deficiency of other specified B group vitamins: Secondary | ICD-10-CM

## 2011-04-21 DIAGNOSIS — D638 Anemia in other chronic diseases classified elsewhere: Secondary | ICD-10-CM

## 2011-04-21 DIAGNOSIS — D649 Anemia, unspecified: Secondary | ICD-10-CM

## 2011-04-21 LAB — CBC WITH DIFFERENTIAL/PLATELET
BASO%: 1 % (ref 0.0–2.0)
Eosinophils Absolute: 0.3 10*3/uL (ref 0.0–0.5)
HCT: 32.2 % — ABNORMAL LOW (ref 34.8–46.6)
LYMPH%: 26.6 % (ref 14.0–49.7)
MCHC: 32.3 g/dL (ref 31.5–36.0)
MONO#: 0.9 10*3/uL (ref 0.1–0.9)
NEUT#: 3.6 10*3/uL (ref 1.5–6.5)
NEUT%: 53.8 % (ref 38.4–76.8)
Platelets: 146 10*3/uL (ref 145–400)
RBC: 3.78 10*6/uL (ref 3.70–5.45)
WBC: 6.7 10*3/uL (ref 3.9–10.3)
lymph#: 1.8 10*3/uL (ref 0.9–3.3)
nRBC: 0 % (ref 0–0)

## 2011-04-25 ENCOUNTER — Ambulatory Visit (INDEPENDENT_AMBULATORY_CARE_PROVIDER_SITE_OTHER): Payer: Medicare Other | Admitting: Nurse Practitioner

## 2011-04-25 ENCOUNTER — Encounter: Payer: Self-pay | Admitting: Nurse Practitioner

## 2011-04-25 VITALS — BP 128/58 | HR 62 | Ht 63.0 in | Wt 221.8 lb

## 2011-04-25 DIAGNOSIS — I480 Paroxysmal atrial fibrillation: Secondary | ICD-10-CM

## 2011-04-25 DIAGNOSIS — I4891 Unspecified atrial fibrillation: Secondary | ICD-10-CM

## 2011-04-25 DIAGNOSIS — I2510 Atherosclerotic heart disease of native coronary artery without angina pectoris: Secondary | ICD-10-CM

## 2011-04-25 DIAGNOSIS — I4581 Long QT syndrome: Secondary | ICD-10-CM

## 2011-04-25 NOTE — Patient Instructions (Addendum)
Stop your amiodarone. Do not take anymore.  We will get you an appointment to see Dr. Johney Frame later this week to discuss other options regarding your atrial fib.   You are not to drive.

## 2011-04-25 NOTE — Assessment & Plan Note (Signed)
No recurrent chest pain. 

## 2011-04-25 NOTE — Progress Notes (Signed)
[name] Date of Birth: 04-30-1933 Medical Record #[id number]  History of Present Illness: [name] is seen back today for a follow up visit. She is seen for Dr. Swaziland today. She has a complex history. She has known CAD with prior CABG. She has had atrial fib. She was placed on amiodarone initially in May. At her visit in September, she was back in atrial fib with RVR. She had stopped the amiodarone for some unknown reason. It was restarted. She had a syncopal episode a few days later here in the office. She was admitted. She was seen by Dr. Johney Frame in consult. Her QT has been an issue. She is not a candidate for class IC medications due to her CAD. Multaq was not felt to be a good option given that she had CHF on exam. Amiodarone was restarted. QT has remained prolonged. She has had no further syncope. She feels bad. She is still lethargic despite cutting back the metoprolol. No chest pain. Her daughter is here with her today. She agrees that her mom is too sleepy, too unsteady and also having issues with her memory.   Current Outpatient Prescriptions on File Prior to Visit  Medication Sig Dispense Refill  . amitriptyline (ELAVIL) 75 MG tablet Take 25 mg by mouth at bedtime. 3 TABLETS DAILY      . aspirin 81 MG tablet Take 81 mg by mouth daily.        Marland Kitchen atorvastatin (LIPITOR) 40 MG tablet Take 40 mg by mouth daily.        . Cholecalciferol (VITAMIN D) 2000 UNITS tablet Take 2,000 Units by mouth daily.        . Cyanocobalamin (VITAMIN B-12 IJ) Inject as directed every 30 (thirty) days.        . darbepoetin (ARANESP) 300 MCG/0.6ML SOLN Inject 300 mcg into the skin every 30 (thirty) days.        Marland Kitchen esomeprazole (NEXIUM) 40 MG capsule Take 40 mg by mouth daily before breakfast.        . fish oil-omega-3 fatty acids 1000 MG capsule Take by mouth daily.        . furosemide (LASIX) 20 MG tablet Take 20 mg by mouth daily.        Marland Kitchen glimepiride (AMARYL) 4 MG tablet Take 4 mg by mouth 2  (two) times daily.        Marland Kitchen HYDROcodone-homatropine (HYCODAN) 5-1.5 MG/5ML syrup Take by mouth every 6 (six) hours as needed.        . insulin glargine (LANTUS) 100 UNIT/ML injection Inject 35 Units into the skin at bedtime.       . insulin lispro (HUMALOG) 100 UNIT/ML injection Inject into the skin.        . IRON PO Take by mouth daily.       Marland Kitchen levothyroxine (SYNTHROID, LEVOTHROID) 137 MCG tablet Take 137 mcg by mouth daily.       Marland Kitchen MAGNESIUM PO Take by mouth daily.        . metoprolol tartrate (LOPRESSOR) 25 MG tablet Take 1 tablet (25 mg total) by mouth 2 (two) times daily.  60 tablet  5  . warfarin (COUMADIN) 10 MG tablet Take 10 mg by mouth as directed.        Marland Kitchen DISCONTD: enoxaparin (LOVENOX) 100 MG/ML SOLN Inject into the skin every 12 (twelve) hours.        Marland Kitchen DISCONTD: simvastatin (ZOCOR) 80 MG tablet Take 80 mg by mouth  at bedtime.          Allergies  Allergen Reactions  . Betadine (Povidone Iodine)   . Capoten (Captopril)   . Darvocet (Propoxyphene N-Acetaminophen)   . Motrin (Ibuprofen)   . Penicillins     Past Medical History  Diagnosis Date  . Coronary artery disease     Severe two vessel; s/p CABG  . Hypertension   . Diabetes mellitus     Type 2  . Hyperlipidemia   . Diabetic neuropathy   . PAF (paroxysmal atrial fibrillation)     on amiodarone  . CVA (cerebral vascular accident) 2004  . Hypothyroidism   . Aortic stenosis   . Myocardial infarction 2010    Non-ST-segment elevation; s/p CABG  . Chronic anemia   . High risk medication use     Amiodarone  . Diastolic dysfunction     per echo in May of 2012    Past Surgical History  Procedure Date  . Coronary artery bypass graft 06/2009    X3, LIMA to LAD, SVG to diagonal, SVG to the posterior descending artery.  . Tonsillectomy   . Knee arthroscopy   . Transthoracic echocardiogram 11/12/2010     Ejection fraction felt to be around 50%.  Wall thickness was increased in a pattern of mild LVH.  Moderately  dilated left atrium.  Mildly dilated right atrium. Right ventricle was mildly dilated with mildly reduced systolic function  . Cardiac catheterization 06/09/2009    inferior wall hypokinesia with ejection fraction of 55%.  . Abdominal hysterectomy     History  Smoking status  . Former Smoker  Smokeless tobacco  . Former Neurosurgeon  . Quit date: 07/04/1972    History  Alcohol Use No    Family History  Problem Relation Age of Onset  . Colon cancer Mother   . Stroke Mother   . Hypertension Mother   . Cancer Father   . Heart attack Father     x2    Review of Systems: The review of systems is positive for chronic lower extremity edema. No recurrent atrial fib. No recurrent syncope. She is lethargic. Still not allowed to drive.  All other systems were reviewed and are negative.  Physical Exam: BP 128/58  Pulse 62  Wt 221 lb 12.8 oz (100.608 kg) Patient is pleasant and in no acute distress. She is obese. She is a little unsteady on her feet. Skin is warm and dry. Color is normal.  HEENT is unremarkable. Normocephalic/atraumatic. PERRL. Sclera are nonicteric. Neck is supple. No masses. No JVD. Lungs are clear. Cardiac exam shows a regular rate and rhythm. Abdomen is obese but soft. Extremities are with 1+ pedal edema. Gait and ROM are intact. No gross neurologic deficits noted.  LABORATORY DATA: EKG shows further prolongation of her QT. Now at 608 with a QTc of 617. Tracing is reviewed with Dr. Swaziland.    Assessment / Plan:

## 2011-04-25 NOTE — Assessment & Plan Note (Addendum)
She has not had recurrence but has continued prolongation of the QT interval. She continues to feel somewhat poorly with lethargy and weakness. No recurrent syncope. Dr. Swaziland and I have discussed her care. We are stopping the amiodarone. We will have Dr. Johney Frame see her back this week for further options. Her other medicines are continued. She is still not to drive. Patient is agreeable to this plan and will call if any problems develop in the interim.   After the visit, I looked up Elavil with amiodarone. There is an increased risk of QT prolongation. I suspect this has been a long term medicine for [name].

## 2011-04-29 ENCOUNTER — Telehealth: Payer: Self-pay | Admitting: *Deleted

## 2011-04-29 ENCOUNTER — Ambulatory Visit (INDEPENDENT_AMBULATORY_CARE_PROVIDER_SITE_OTHER): Payer: Medicare Other | Admitting: Internal Medicine

## 2011-04-29 ENCOUNTER — Encounter: Payer: Self-pay | Admitting: Internal Medicine

## 2011-04-29 VITALS — BP 138/64 | HR 72 | Resp 18 | Ht 63.0 in | Wt 220.4 lb

## 2011-04-29 DIAGNOSIS — I4891 Unspecified atrial fibrillation: Secondary | ICD-10-CM

## 2011-04-29 DIAGNOSIS — I2510 Atherosclerotic heart disease of native coronary artery without angina pectoris: Secondary | ICD-10-CM

## 2011-04-29 NOTE — Patient Instructions (Signed)
Your physician has requested that you have a TEE. During a TEE, sound waves are used to create images of your heart. It provides your doctor with information about the size and shape of your heart and how well your heart's chambers and valves are working. In this test, a transducer is attached to the end of a flexible tube that's guided down your throat and into your esophagus (the tube leading from you mouth to your stomach) to get a more detailed image of your heart. You are not awake for the procedure. Please see the instruction sheet given to you today. For further information please visit [email or web address] Scheduled for May 23, 2011 at 2:00  Your physician has recommended that you have an ablation. Catheter ablation is a medical procedure used to treat some cardiac arrhythmias (irregular heartbeats). During catheter ablation, a long, thin, flexible tube is put into a blood vessel in your groin (upper thigh), or neck. This tube is called an ablation catheter. It is then guided to your heart through the blood vessel. Radio frequency waves destroy small areas of heart tissue where abnormal heartbeats may cause an arrhythmia to start. Please see the instruction sheet given to you today. Scheduled for May 24, 2011 at 7:30  We will be in touch with you for instructions regarding the above procedures.  Have INR checked weekly at Dr. Ferd Hibbs office prior to above procedures.

## 2011-04-29 NOTE — Telephone Encounter (Signed)
Pt will need weekly INR's checked at Dr. Ferd Hibbs office prior to TEE on May 23, 2011 and afib ablation on May 24, 2011. I called and left this information on Melissa's voicemail at Dr. Ferd Hibbs office. I left message to call back if there were any questions.

## 2011-04-29 NOTE — Assessment & Plan Note (Signed)
The patient has symptomatic paroxysmal atrial fibrillation.  She has failed medical therapy with amiodarone.  She is not a candidate for Ics due to h/o CAD.  She also is not a candidate for Tikosyn or sotalol due to prolonged QT. Therapeutic strategies for afib including medicine and ablation were discussed in detail with the patient today. Risk, benefits, and alternatives to EP study and radiofrequency ablation for afib were also discussed in detail today. These risks include but are not limited to stroke, bleeding, vascular damage, tamponade, perforation, damage to the esophagus, lungs, and other structures, pulmonary vein stenosis, worsening renal function, and death. The patient understands these risk and wishes to proceed.  We will therefore proceed with catheter ablation at the next available time. She will continue coumadin and rate control in the interim.

## 2011-04-29 NOTE — Progress Notes (Signed)
The patient presents today for further electrophysiology consultation regarding her afib.  She was previously seen by me in consultation during her hospitalization 5/12.  At that time, she was started on amiodarone.  She reports poor tolerance of amiodarone due to fatigue.  She recently was seen by Dr Thomasene Lot and found to have a prolonged QT.  Her amiodarone was stopped.  She reports complete resolution in fatigue off of amiodarone.  She reports feeling "much better" now and states that she does not wish to take amiodarone again. Due to prolonged QT and CAD, she is felt to have very few AAD options.  She is therefore referred back to my office for further evaluation.   Today, she denies symptoms of palpitations, chest pain, shortness of breath, orthopnea, PND, lower extremity edema, dizziness, presyncope, syncope, or neurologic sequela.  The patient feels that she is tolerating medications without difficulties and is otherwise without complaint today.   Past Medical History  Diagnosis Date  . Coronary artery disease     Severe two vessel; s/p CABG  . Hypertension   . Diabetes mellitus     Type 2  . Hyperlipidemia   . Diabetic neuropathy   . PAF (paroxysmal atrial fibrillation)     failed medical therapy with amiodarone  . CVA (cerebral vascular accident) 2004  . Hypothyroidism   . Chronic anemia   . Diastolic dysfunction     per echo in May of 2012   Past Surgical History  Procedure Date  . Coronary artery bypass graft 06/2009    X3, LIMA to LAD, SVG to diagonal, SVG to the posterior descending artery.  . Tonsillectomy   . Knee arthroscopy   . Transthoracic echocardiogram 11/12/2010     Ejection fraction felt to be around 50%.  Wall thickness was increased in a pattern of mild LVH.  Moderately dilated left atrium.  Mildly dilated right atrium. Right ventricle was mildly dilated with mildly reduced systolic function  . Cardiac catheterization 06/09/2009    inferior wall hypokinesia with  ejection fraction of 55%.  . Abdominal hysterectomy     Current Outpatient Prescriptions  Medication Sig Dispense Refill  . amitriptyline (ELAVIL) 75 MG tablet Take 25 mg by mouth at bedtime. 3 TABLETS DAILY      . aspirin 81 MG tablet Take 81 mg by mouth daily.        Marland Kitchen atorvastatin (LIPITOR) 40 MG tablet Take 40 mg by mouth daily.        . Cholecalciferol (VITAMIN D) 2000 UNITS tablet Take 2,000 Units by mouth daily.        . Cyanocobalamin (VITAMIN B-12 IJ) Inject as directed every 30 (thirty) days.        . darbepoetin (ARANESP) 300 MCG/0.6ML SOLN Inject 300 mcg into the skin every 30 (thirty) days.        Marland Kitchen esomeprazole (NEXIUM) 40 MG capsule Take 40 mg by mouth daily before breakfast.        . fish oil-omega-3 fatty acids 1000 MG capsule Take by mouth daily.        . furosemide (LASIX) 20 MG tablet Take 20 mg by mouth daily.        Marland Kitchen glimepiride (AMARYL) 4 MG tablet Take 4 mg by mouth 2 (two) times daily.        . insulin glargine (LANTUS) 100 UNIT/ML injection Inject 35 Units into the skin at bedtime.       . insulin lispro (HUMALOG) 100 UNIT/ML injection Inject  into the skin.        . IRON PO Take by mouth daily.       Marland Kitchen levothyroxine (SYNTHROID, LEVOTHROID) 137 MCG tablet Take 137 mcg by mouth daily.       Marland Kitchen MAGNESIUM PO Take by mouth daily.        . metoprolol tartrate (LOPRESSOR) 25 MG tablet Take 1 tablet (25 mg total) by mouth 2 (two) times daily.  60 tablet  5  . warfarin (COUMADIN) 10 MG tablet Take 10 mg by mouth as directed.        Marland Kitchen HYDROcodone-homatropine (HYCODAN) 5-1.5 MG/5ML syrup Take by mouth every 6 (six) hours as needed.        Marland Kitchen DISCONTD: enoxaparin (LOVENOX) 100 MG/ML SOLN Inject into the skin every 12 (twelve) hours.        Marland Kitchen DISCONTD: simvastatin (ZOCOR) 80 MG tablet Take 80 mg by mouth at bedtime.          Allergies  Allergen Reactions  . Betadine (Povidone Iodine)   . Capoten (Captopril)   . Darvocet (Propoxyphene N-Acetaminophen)   . Motrin  (Ibuprofen)   . Penicillins     History   Social History  . Marital Status: Married    Spouse Name: N/A    Number of Children: 2  . Years of Education: N/A   Occupational History  . teacher    Social History Main Topics  . Smoking status: Former Games developer  . Smokeless tobacco: Former Neurosurgeon    Quit date: 07/04/1972  . Alcohol Use: No  . Drug Use: No  . Sexually Active: Not on file   Other Topics Concern  . Not on file   Social History Narrative   Lives in [place] with spouse    Family History  Problem Relation Age of Onset  . Colon cancer Mother   . Stroke Mother   . Hypertension Mother   . Cancer Father   . Heart attack Father     x2    ROS-  All systems are reviewed and are negative except as outlined in the HPI above   Physical Exam: Filed Vitals:   04/29/11 0912  BP: 138/64  Pulse: 72  Resp: 18  Height: 5\' 3"  (1.6 m)  Weight: 220 lb 6.4 oz (99.973 kg)    GEN- The patient is overweight appearing, alert and oriented x 3 today.   Head- normocephalic, atraumatic Eyes-  Sclera clear, conjunctiva pink Ears- hearing intact Oropharynx- clear Neck- supple, no JVP Lymph- no cervical lymphadenopathy Lungs- Clear to ausculation bilaterally, normal work of breathing Heart- Regular rate and rhythm,  2/6 SEM LUSB (early peaking) GI- soft, NT, ND, + BS Extremities- no clubbing, cyanosis, trace edema MS- no significant deformity or atrophy Skin- no rash or lesion Psych- euthymic mood, full affect Neuro- strength and sensation are intact  ekg 04/25/11 reveals sinus rhythm RBBB, QTc 617 Echo 11/12/10 reveals EF 55%, mild LVH, aortic sclerosis, LA 49mm  Assessment and Plan:

## 2011-04-29 NOTE — Discharge Summary (Signed)
[name], [name] NO.:  [id number]  MEDICAL RECORD NO.:  [id number]  LOCATION:  3731                         FACILITY:  [facility]  PHYSICIAN:  Pricilla Riffle, MD, FACCDATE OF BIRTH:  Aug 19, 1932  DATE OF ADMISSION:  04/06/2011 DATE OF DISCHARGE:  04/09/2011                              DISCHARGE SUMMARY   PRIMARY CARDIOLOGIST:  [name], MD.  PRIMARY CARE PROVIDER:  [name], MD.  DISCHARGE DIAGNOSES: 1. Syncope secondary to bradycardia and vasovagal episode. 2. Recurrent atrial flutter, currently in sinus bradycardia. 3. Diastolic heart failure, ejection fraction 50%. 4. Insulin-dependent diabetes mellitus. 5. Chronic anemia.  SECONDARY DIAGNOSES: 1. Hypertension. 2. Diabetic neuropathy. 3. Hyperlipidemia. 4. Coronary artery disease, status post coronary artery bypass     grafting in 2010.     a.     Left internal mammary artery to left anterior descending,      saphenous vein graft to diagonal, saphenous vein graft to      posterior descending artery.  ALLERGIES:  IODINE, PENICILLIN, CAPTOPRIL, DARVOCET.  PROCEDURE/DIAGNOSTICS/PERFORMED DURING HOSPITALIZATION: 1. CT of the head showing atrophy and chronic ischemic [place] matter     disease without acute intracranial abnormality and change in left     corona radiata lacunar infarction. 2. Chest x-ray showed cardiomegaly with small effusion, questionable     mild congestion.  REASON FOR HOSPITALIZATION:  This is a 77 year old African American female with the above-stated problem list who has been having her medications titrated secondary to recurrent atrial flutter with rapid ventricular response.  She was recently placed on amiodarone and increased metoprolol as well as continued on her diltiazem for approximately the past week.  She returned for follow-up to Dr. Swaziland and she was not feeling well and had a syncopal episode in the office. Prior that she was nauseous, hot, and sweaty.   It was noted the patient was bradycardic.  EKG with sinus rhythm, question of intermittent heart block.  Patient denies chest pain or shortness of breath.  The patient was transferred to the emergency department for further workup.  The patient was admitted by the Cardiology Service and she was gently hydrated.  Her amiodarone, diltiazem and Lopressor were held secondary to documented bradycardia in the office.  The patient continued to feel better, holding her medications.  On October 5, the patient's amiodarone was restarted at a low dose as well as her metoprolol.  The patient's diltiazem was discontinued in full.  Her rates were watched closely on telemetry and it continued to remain stable without bradycardia. Therefore at this time the patient would be continued on amiodarone and Lopressor.  The patient was ambulating in the hall without difficulty or complaints of syncope.  Dr. Tenny Craw evaluated the patient on day of discharge.  Initially, the patient's Lasix has been held secondary to her elevated creatinine at 2.10,  but this has resolved to 1.35 by the day of discharge, therefore her Lasix was reinitiated.  The patient had no complaints of shortness of breath.  She is ambulating without dyspnea.  Dr. Tenny Craw felt her stable for home.  She will have outpatient follow up.  DISCHARGE LABS:  Sodium 137, potassium 4.1,  BUN 16, creatinine 1.35, INR 2.21.  DISCHARGE MEDICATIONS: 1. Amiodarone 200 mg 1 tablet daily. 2. Amitriptyline 75 mg 1 tablet daily. 3. Aspirin 81 mg 1 tablet daily. 4. Fish oil 1000 mg 1 capsule daily. 5. Furosimide 20 mg 1 tablet daily. 6. Amaryl 4 mg 1 tablet twice daily. 7. Humalog sliding-scale insulin 3 times a day as needed. 8. Vicodin 5/1.5 mg/5 mL 1 teaspoon every 6 hours as needed for cough. 9. Iron over-the-counter 1 tablet twice daily. 10.Lantus 35 units subcutaneously daily. 11.Levothroid 137 mcg 1 tablet daily. 12.Lipitor 40 mg 1 tablet  daily. 13.Magnesium over-the-counter 1 tablet daily. 14.Metoprolol tartrate 200 mg 2 tablets twice daily. 15.Nexium 40 mg 1 capsule daily. 16.Vitamin B12 one ejection monthly. 17.Vitamin D3 of 2000 units daily. 18.Coumadin 10 mg 1/2 tablet on Fridays with 1 tablet every other day. 19.Please stop taking diltiazem.  FOLLOWUP PLANS AND INSTRUCTIONS: 1. The patient will follow up with Dr. Swaziland in 10-14 days, the     office will call and schedule this. 2. The patient is to increase activity slowly. 3. The patient is to stop any activities that causes dyspnea,     shortness of breath, or chest pain. 4. The patient is to call the office for further syncopal episodes. 5. The patient is to continue low-sodium heart-healthy diet. 6. The patient needs to have INR drawn on Friday, October 12.  DURATION OF DISCHARGE:  Greater than 30 minutes with physician and physician extender's time.     Leonette Monarch, PA-C   ______________________________ Pricilla Riffle, MD, [facility]    NB/MEDQ  D:  04/09/2011  T:  04/09/2011  Job:  [id number]  cc:   [name], MD [name], M.D.  Electronically Signed by Alen Blew P.A. on 04/19/2011 11:52:40 AM Electronically Signed by Dietrich Pates MD FACC on 04/29/2011 01:49:27 PM

## 2011-04-29 NOTE — Assessment & Plan Note (Signed)
Stable No change required today  

## 2011-05-10 ENCOUNTER — Encounter: Payer: Self-pay | Admitting: *Deleted

## 2011-05-11 ENCOUNTER — Other Ambulatory Visit: Payer: Self-pay | Admitting: Cardiology

## 2011-05-11 DIAGNOSIS — I4891 Unspecified atrial fibrillation: Secondary | ICD-10-CM

## 2011-05-16 ENCOUNTER — Ambulatory Visit: Payer: Medicare Other | Admitting: *Deleted

## 2011-05-16 DIAGNOSIS — I4891 Unspecified atrial fibrillation: Secondary | ICD-10-CM

## 2011-05-16 DIAGNOSIS — I5189 Other ill-defined heart diseases: Secondary | ICD-10-CM

## 2011-05-16 DIAGNOSIS — I2510 Atherosclerotic heart disease of native coronary artery without angina pectoris: Secondary | ICD-10-CM

## 2011-05-16 DIAGNOSIS — E039 Hypothyroidism, unspecified: Secondary | ICD-10-CM

## 2011-05-16 DIAGNOSIS — Z7901 Long term (current) use of anticoagulants: Secondary | ICD-10-CM

## 2011-05-16 LAB — CBC WITH DIFFERENTIAL/PLATELET
Basophils Absolute: 0.1 10*3/uL (ref 0.0–0.1)
Eosinophils Relative: 4 % (ref 0–5)
Lymphocytes Relative: 29 % (ref 12–46)
Lymphs Abs: 1.7 10*3/uL (ref 0.7–4.0)
Neutro Abs: 2.7 10*3/uL (ref 1.7–7.7)
Neutrophils Relative %: 48 % (ref 43–77)
Platelets: 180 10*3/uL (ref 150–400)
RBC: 3.95 MIL/uL (ref 3.87–5.11)
RDW: 16.4 % — ABNORMAL HIGH (ref 11.5–15.5)
WBC: 5.7 10*3/uL (ref 4.0–10.5)

## 2011-05-16 LAB — PROTIME-INR
INR: 2.1 — ABNORMAL HIGH (ref <1.50)
Prothrombin Time: 24.3 seconds — ABNORMAL HIGH (ref 11.6–15.2)

## 2011-05-16 LAB — APTT: aPTT: 40 seconds — ABNORMAL HIGH (ref 24–37)

## 2011-05-17 ENCOUNTER — Encounter (HOSPITAL_COMMUNITY): Payer: Self-pay | Admitting: Pharmacy Technician

## 2011-05-17 LAB — BASIC METABOLIC PANEL WITH GFR
CO2: 26 mEq/L (ref 19–32)
Chloride: 98 mEq/L (ref 96–112)
Creat: 1.68 mg/dL — ABNORMAL HIGH (ref 0.50–1.10)
GFR, Est Non African American: 29 mL/min/{1.73_m2} — ABNORMAL LOW
Potassium: 4.2 mEq/L (ref 3.5–5.3)

## 2011-05-18 ENCOUNTER — Other Ambulatory Visit: Payer: Self-pay | Admitting: *Deleted

## 2011-05-18 DIAGNOSIS — I4891 Unspecified atrial fibrillation: Secondary | ICD-10-CM

## 2011-05-19 ENCOUNTER — Other Ambulatory Visit: Payer: Self-pay | Admitting: Oncology

## 2011-05-19 ENCOUNTER — Other Ambulatory Visit (HOSPITAL_BASED_OUTPATIENT_CLINIC_OR_DEPARTMENT_OTHER): Payer: Medicare Other

## 2011-05-19 ENCOUNTER — Other Ambulatory Visit (HOSPITAL_COMMUNITY): Payer: Self-pay | Admitting: Oncology

## 2011-05-19 ENCOUNTER — Ambulatory Visit (HOSPITAL_BASED_OUTPATIENT_CLINIC_OR_DEPARTMENT_OTHER): Payer: TRICARE For Life (TFL)

## 2011-05-19 VITALS — BP 137/60 | HR 62 | Temp 97.7°F

## 2011-05-19 DIAGNOSIS — N189 Chronic kidney disease, unspecified: Secondary | ICD-10-CM

## 2011-05-19 DIAGNOSIS — D631 Anemia in chronic kidney disease: Secondary | ICD-10-CM

## 2011-05-19 DIAGNOSIS — E538 Deficiency of other specified B group vitamins: Secondary | ICD-10-CM

## 2011-05-19 DIAGNOSIS — D649 Anemia, unspecified: Secondary | ICD-10-CM

## 2011-05-19 LAB — CBC WITH DIFFERENTIAL/PLATELET
BASO%: 0.9 % (ref 0.0–2.0)
Eosinophils Absolute: 0.3 10*3/uL (ref 0.0–0.5)
HCT: 32.2 % — ABNORMAL LOW (ref 34.8–46.6)
LYMPH%: 27.2 % (ref 14.0–49.7)
MCHC: 32 g/dL (ref 31.5–36.0)
MONO#: 1.2 10*3/uL — ABNORMAL HIGH (ref 0.1–0.9)
NEUT#: 2.7 10*3/uL (ref 1.5–6.5)
NEUT%: 47.3 % (ref 38.4–76.8)
Platelets: 159 10*3/uL (ref 145–400)
WBC: 5.7 10*3/uL (ref 3.9–10.3)
lymph#: 1.6 10*3/uL (ref 0.9–3.3)

## 2011-05-19 MED ORDER — DARBEPOETIN ALFA-POLYSORBATE 500 MCG/ML IJ SOLN
200.0000 ug | Freq: Once | INTRAMUSCULAR | Status: AC
  Administered 2011-05-19: 200 ug via SUBCUTANEOUS
  Filled 2011-05-19: qty 1

## 2011-05-23 ENCOUNTER — Other Ambulatory Visit: Payer: Self-pay | Admitting: *Deleted

## 2011-05-23 ENCOUNTER — Encounter (HOSPITAL_COMMUNITY)
Admission: RE | Disposition: A | Discharge status: Home / Self Care | Payer: Self-pay | Source: Ambulatory Visit | Attending: Cardiology

## 2011-05-23 ENCOUNTER — Ambulatory Visit (HOSPITAL_COMMUNITY)
Admission: RE | Admit: 2011-05-23 | Discharge: 2011-05-23 | Disposition: A | Discharge status: Home / Self Care | Payer: Medicare Other | Source: Ambulatory Visit | Attending: Cardiology | Admitting: Cardiology

## 2011-05-23 ENCOUNTER — Encounter (HOSPITAL_COMMUNITY): Payer: Self-pay

## 2011-05-23 ENCOUNTER — Ambulatory Visit (HOSPITAL_COMMUNITY): Admit: 2011-05-23 | Payer: Self-pay | Admitting: Cardiology

## 2011-05-23 DIAGNOSIS — I4891 Unspecified atrial fibrillation: Secondary | ICD-10-CM | POA: Insufficient documentation

## 2011-05-23 HISTORY — PX: TEE WITHOUT CARDIOVERSION: SHX5443

## 2011-05-23 HISTORY — DX: Gastro-esophageal reflux disease without esophagitis: K21.9

## 2011-05-23 HISTORY — DX: Unspecified osteoarthritis, unspecified site: M19.90

## 2011-05-23 LAB — GLUCOSE, CAPILLARY
Glucose-Capillary: 61 mg/dL — ABNORMAL LOW (ref 70–99)
Glucose-Capillary: 103 mg/dL — ABNORMAL HIGH (ref 70–99)

## 2011-05-23 SURGERY — ECHOCARDIOGRAM, TRANSESOPHAGEAL
Anesthesia: Moderate Sedation

## 2011-05-23 MED ORDER — MIDAZOLAM HCL 10 MG/2ML IJ SOLN: 10.0000 mg | Freq: Once | INTRAMUSCULAR | Status: DC

## 2011-05-23 MED ORDER — BUTAMBEN-TETRACAINE-BENZOCAINE 2-2-14 % EX AERO
INHALATION_SPRAY | CUTANEOUS | Status: DC | PRN
  Administered 2011-05-23: 2 via TOPICAL

## 2011-05-23 MED ORDER — MIDAZOLAM HCL 10 MG/2ML IJ SOLN
INTRAMUSCULAR | Status: DC | PRN
  Administered 2011-05-23 (×2): 2 mg via INTRAVENOUS

## 2011-05-23 MED ORDER — FENTANYL CITRATE 0.05 MG/ML IJ SOLN: 250.0000 ug | Freq: Once | INTRAMUSCULAR | Status: DC

## 2011-05-23 MED ORDER — SODIUM CHLORIDE 0.45 % IV SOLN
INTRAVENOUS | Status: DC
  Administered 2011-05-23: 14:00:00 via INTRAVENOUS

## 2011-05-23 MED ORDER — FENTANYL CITRATE 0.05 MG/ML IJ SOLN
INTRAMUSCULAR | Status: AC
  Filled 2011-05-23: qty 2

## 2011-05-23 MED ORDER — DEXTROSE 50 % IV SOLN
INTRAVENOUS | Status: AC
  Filled 2011-05-23: qty 50

## 2011-05-23 MED ORDER — ENOXAPARIN SODIUM 100 MG/ML ~~LOC~~ SOLN
100.0000 mg | SUBCUTANEOUS | Status: DC
  Filled 2011-05-23: qty 1

## 2011-05-23 MED ORDER — BENZOCAINE 20 % MT SOLN
1.0000 "application " | OROMUCOSAL | Status: DC | PRN
  Filled 2011-05-23: qty 57

## 2011-05-23 MED ORDER — MIDAZOLAM HCL 10 MG/2ML IJ SOLN
INTRAMUSCULAR | Status: AC
  Filled 2011-05-23: qty 2

## 2011-05-23 MED ORDER — FENTANYL CITRATE 0.05 MG/ML IJ SOLN
INTRAMUSCULAR | Status: DC | PRN
  Administered 2011-05-23: 25 ug via INTRAVENOUS

## 2011-05-23 MED ORDER — DEXTROSE 50 % IV SOLN
25.0000 mL | Freq: Once | INTRAVENOUS | Status: AC
  Administered 2011-05-23: 25 mL via INTRAVENOUS

## 2011-05-23 NOTE — Interval H&P Note (Signed)
Reviewed; for TEE prior to afib ablation; no other changes Olga Millers

## 2011-05-23 NOTE — Procedures (Signed)
See echo report in system. [name]

## 2011-05-23 NOTE — H&P (View-Only) (Signed)
The patient presents today for further electrophysiology consultation regarding her afib.  She was previously seen by me in consultation during her hospitalization 5/12.  At that time, she was started on amiodarone.  She reports poor tolerance of amiodarone due to fatigue.  She recently was seen by Dr Jordon and found to have a prolonged QT.  Her amiodarone was stopped.  She reports complete resolution in fatigue off of amiodarone.  She reports feeling "much better" now and states that she does not wish to take amiodarone again. Due to prolonged QT and CAD, she is felt to have very few AAD options.  She is therefore referred back to my office for further evaluation.   Today, she denies symptoms of palpitations, chest pain, shortness of breath, orthopnea, PND, lower extremity edema, dizziness, presyncope, syncope, or neurologic sequela.  The patient feels that she is tolerating medications without difficulties and is otherwise without complaint today.   Past Medical History  Diagnosis Date  . Coronary artery disease     Severe two vessel; s/p CABG  . Hypertension   . Diabetes mellitus     Type 2  . Hyperlipidemia   . Diabetic neuropathy   . PAF (paroxysmal atrial fibrillation)     failed medical therapy with amiodarone  . CVA (cerebral vascular accident) 2004  . Hypothyroidism   . Chronic anemia   . Diastolic dysfunction     per echo in May of 2012   Past Surgical History  Procedure Date  . Coronary artery bypass graft 06/2009    X3, LIMA to LAD, SVG to diagonal, SVG to the posterior descending artery.  . Tonsillectomy   . Knee arthroscopy   . Transthoracic echocardiogram 11/12/2010     Ejection fraction felt to be around 50%.  Wall thickness was increased in a pattern of mild LVH.  Moderately dilated left atrium.  Mildly dilated right atrium. Right ventricle was mildly dilated with mildly reduced systolic function  . Cardiac catheterization 06/09/2009    inferior wall hypokinesia with  ejection fraction of 55%.  . Abdominal hysterectomy     Current Outpatient Prescriptions  Medication Sig Dispense Refill  . amitriptyline (ELAVIL) 75 MG tablet Take 25 mg by mouth at bedtime. 3 TABLETS DAILY      . aspirin 81 MG tablet Take 81 mg by mouth daily.        . atorvastatin (LIPITOR) 40 MG tablet Take 40 mg by mouth daily.        . Cholecalciferol (VITAMIN D) 2000 UNITS tablet Take 2,000 Units by mouth daily.        . Cyanocobalamin (VITAMIN B-12 IJ) Inject as directed every 30 (thirty) days.        . darbepoetin (ARANESP) 300 MCG/0.6ML SOLN Inject 300 mcg into the skin every 30 (thirty) days.        . esomeprazole (NEXIUM) 40 MG capsule Take 40 mg by mouth daily before breakfast.        . fish oil-omega-3 fatty acids 1000 MG capsule Take by mouth daily.        . furosemide (LASIX) 20 MG tablet Take 20 mg by mouth daily.        . glimepiride (AMARYL) 4 MG tablet Take 4 mg by mouth 2 (two) times daily.        . insulin glargine (LANTUS) 100 UNIT/ML injection Inject 35 Units into the skin at bedtime.       . insulin lispro (HUMALOG) 100 UNIT/ML injection Inject   into the skin.        . IRON PO Take by mouth daily.       . levothyroxine (SYNTHROID, LEVOTHROID) 137 MCG tablet Take 137 mcg by mouth daily.       . MAGNESIUM PO Take by mouth daily.        . metoprolol tartrate (LOPRESSOR) 25 MG tablet Take 1 tablet (25 mg total) by mouth 2 (two) times daily.  60 tablet  5  . warfarin (COUMADIN) 10 MG tablet Take 10 mg by mouth as directed.        . HYDROcodone-homatropine (HYCODAN) 5-1.5 MG/5ML syrup Take by mouth every 6 (six) hours as needed.        . DISCONTD: enoxaparin (LOVENOX) 100 MG/ML SOLN Inject into the skin every 12 (twelve) hours.        . DISCONTD: simvastatin (ZOCOR) 80 MG tablet Take 80 mg by mouth at bedtime.          Allergies  Allergen Reactions  . Betadine (Povidone Iodine)   . Capoten (Captopril)   . Darvocet (Propoxyphene N-Acetaminophen)   . Motrin  (Ibuprofen)   . Penicillins     History   Social History  . Marital Status: Married    Spouse Name: N/A    Number of Children: 2  . Years of Education: N/A   Occupational History  . teacher    Social History Main Topics  . Smoking status: Former Smoker  . Smokeless tobacco: Former User    Quit date: 07/04/1972  . Alcohol Use: No  . Drug Use: No  . Sexually Active: Not on file   Other Topics Concern  . Not on file   Social History Narrative   Lives in [place] with spouse    Family History  Problem Relation Age of Onset  . Colon cancer Mother   . Stroke Mother   . Hypertension Mother   . Cancer Father   . Heart attack Father     x2    ROS-  All systems are reviewed and are negative except as outlined in the HPI above   Physical Exam: Filed Vitals:   04/29/11 0912  BP: 138/64  Pulse: 72  Resp: 18  Height: 5' 3" (1.6 m)  Weight: 220 lb 6.4 oz (99.973 kg)    GEN- The patient is overweight appearing, alert and oriented x 3 today.   Head- normocephalic, atraumatic Eyes-  Sclera clear, conjunctiva pink Ears- hearing intact Oropharynx- clear Neck- supple, no JVP Lymph- no cervical lymphadenopathy Lungs- Clear to ausculation bilaterally, normal work of breathing Heart- Regular rate and rhythm,  2/6 SEM LUSB (early peaking) GI- soft, NT, ND, + BS Extremities- no clubbing, cyanosis, trace edema MS- no significant deformity or atrophy Skin- no rash or lesion Psych- euthymic mood, full affect Neuro- strength and sensation are intact  ekg 04/25/11 reveals sinus rhythm RBBB, QTc 617 Echo 11/12/10 reveals EF 55%, mild LVH, aortic sclerosis, LA 49mm  Assessment and Plan:  

## 2011-05-24 ENCOUNTER — Encounter (HOSPITAL_COMMUNITY): Admission: RE | Payer: Self-pay | Source: Ambulatory Visit

## 2011-05-24 ENCOUNTER — Ambulatory Visit (HOSPITAL_COMMUNITY): Admission: RE | Admit: 2011-05-24 | Payer: Medicare Other | Source: Ambulatory Visit | Admitting: Internal Medicine

## 2011-05-24 SURGERY — ATRIAL FIBRILLATION ABLATION
Anesthesia: LOCAL

## 2011-05-27 ENCOUNTER — Telehealth: Payer: Self-pay | Admitting: Cardiology

## 2011-05-27 NOTE — Telephone Encounter (Signed)
New problem Pt's husband wants to know should she keep her appt on Monday. Please cal back

## 2011-05-27 NOTE — Telephone Encounter (Signed)
Mr Brooker called to find out if [name] needs to keep the appt with Dr Swaziland on Monday.  He states the ablation was cancelled because of an abnormal finding on her TEE.

## 2011-05-30 ENCOUNTER — Encounter: Payer: Self-pay | Admitting: Cardiology

## 2011-05-30 ENCOUNTER — Ambulatory Visit (INDEPENDENT_AMBULATORY_CARE_PROVIDER_SITE_OTHER): Payer: Medicare Other | Admitting: Cardiology

## 2011-05-30 VITALS — BP 136/86 | HR 121 | Ht 63.0 in | Wt 218.1 lb

## 2011-05-30 DIAGNOSIS — I079 Rheumatic tricuspid valve disease, unspecified: Secondary | ICD-10-CM

## 2011-05-30 DIAGNOSIS — I2510 Atherosclerotic heart disease of native coronary artery without angina pectoris: Secondary | ICD-10-CM

## 2011-05-30 DIAGNOSIS — I471 Supraventricular tachycardia: Secondary | ICD-10-CM

## 2011-05-30 DIAGNOSIS — I4892 Unspecified atrial flutter: Secondary | ICD-10-CM

## 2011-05-30 DIAGNOSIS — I498 Other specified cardiac arrhythmias: Secondary | ICD-10-CM

## 2011-05-30 DIAGNOSIS — R0602 Shortness of breath: Secondary | ICD-10-CM

## 2011-05-30 DIAGNOSIS — I071 Rheumatic tricuspid insufficiency: Secondary | ICD-10-CM

## 2011-05-30 DIAGNOSIS — I50810 Right heart failure, unspecified: Secondary | ICD-10-CM | POA: Insufficient documentation

## 2011-05-30 DIAGNOSIS — Z951 Presence of aortocoronary bypass graft: Secondary | ICD-10-CM

## 2011-05-30 DIAGNOSIS — I4891 Unspecified atrial fibrillation: Secondary | ICD-10-CM

## 2011-05-30 MED ORDER — METOPROLOL TARTRATE 50 MG PO TABS: 50.0000 mg | ORAL_TABLET | Freq: Two times a day (BID) | ORAL | Status: DC

## 2011-05-30 NOTE — Telephone Encounter (Signed)
Yes we need to see

## 2011-05-30 NOTE — Patient Instructions (Signed)
We will increase your metoprolol to 50 mg twice a day.  Continue on your other medications.  I will see you again in 4 weeks.  Avoid salt intake  Monitor your weight daily. If your weight increases by more than 3 pounds then increase your lasix to 40 mg.

## 2011-05-30 NOTE — Assessment & Plan Note (Signed)
She has no recurrent anginal symptoms. We will continue with her current medical therapy.

## 2011-05-30 NOTE — Progress Notes (Signed)
[name] Date of Birth: January 26, 1933 Medical Record #[id number]  History of Present Illness: [name] is seen back today for a follow up visit.  She has a complex history. She has known CAD with prior CABG. She has had atrial fib. She was placed on amiodarone initially in May. At her visit in September, she was back in atrial fib with RVR. She had stopped the amiodarone for some unknown reason. It was restarted. She had a syncopal episode a few days later here in the office. She was admitted. She was seen by Dr. Johney Frame in consult. Her QT has been an issue. She is not a candidate for class IC medications due to her CAD. Multaq was not felt to be a good option given that she had CHF on exam. Amiodarone was restarted. QT has remained prolonged. She has had no further syncope. Amiodarone was discontinued because of symptoms of marked fatigue. She was scheduled for an ablative procedure by Dr. Johney Frame. However on her TEE she was noted to have severe tricuspid insufficiency with moderate right atrial enlargement as well as some right ventricular enlargement. Estimated coronary pressures were normal. She had moderate mitral insufficiency. Her left ventricular function was good. Given her right atrial enlargement severe tricuspid insufficiency it was felt that the patient would not be advisable. Her last echocardiogram of record was in 2007 at which time she only had mild mitral and tricuspid insufficiency. It is noteworthy that she did have significant edema after her bypass surgery. This has improved but she still has ankle edema. Her weight is down 3 pounds since her last visit. She is unaware of any tachycardia at present. She denies palpitations, dizziness, or dyspnea. She feels that her energy level is better since the amiodarone was discontinued.  Current Outpatient Prescriptions on File Prior to Visit  Medication Sig Dispense Refill  . amitriptyline (ELAVIL) 25 MG tablet Take 75 mg by mouth at  bedtime.        Marland Kitchen aspirin 81 MG tablet Take 81 mg by mouth daily.        Marland Kitchen atorvastatin (LIPITOR) 40 MG tablet Take 40 mg by mouth daily.        . Cholecalciferol (VITAMIN D) 2000 UNITS tablet Take 2,000 Units by mouth daily.        . Cyanocobalamin (VITAMIN B 12 PO) Take 1 tablet by mouth daily.        . darbepoetin (ARANESP) 300 MCG/0.6ML SOLN Inject 300 mcg into the skin every 30 (thirty) days.        Marland Kitchen esomeprazole (NEXIUM) 40 MG capsule Take 40 mg by mouth daily before breakfast.        . fish oil-omega-3 fatty acids 1000 MG capsule Take 1 g by mouth daily.       . furosemide (LASIX) 20 MG tablet Take 20 mg by mouth daily.        Marland Kitchen glimepiride (AMARYL) 4 MG tablet Take 4 mg by mouth 2 (two) times daily.       Marland Kitchen HYDROcodone-homatropine (HYCODAN) 5-1.5 MG/5ML syrup Take 1.5 mLs by mouth daily as needed. For cough.      . insulin glargine (LANTUS) 100 UNIT/ML injection Inject 60 Units into the skin at bedtime.       . insulin lispro (HUMALOG) 100 UNIT/ML injection Inject 28 Units into the skin 3 (three) times daily before meals.       . IRON PO Take 1 tablet by mouth daily.       Marland Kitchen  levothyroxine (SYNTHROID, LEVOTHROID) 137 MCG tablet Take 137 mcg by mouth daily.       Marland Kitchen MAGNESIUM PO Take 1 tablet by mouth daily.       Marland Kitchen warfarin (COUMADIN) 5 MG tablet Take 5-10 mg by mouth daily. Pt takes 5 mg tablet on Wednesday only. On Sunday, Monday, Tuesday, and Thursday pt takes 10 mg (2 tablets.)      . DISCONTD: enoxaparin (LOVENOX) 100 MG/ML SOLN Inject into the skin every 12 (twelve) hours.        Marland Kitchen DISCONTD: simvastatin (ZOCOR) 80 MG tablet Take 80 mg by mouth at bedtime.          Allergies  Allergen Reactions  . Betadine (Povidone Iodine) Itching  . Capoten (Captopril) Other (Alexander Comments)    unknown  . Darvocet (Propoxyphene N-Acetaminophen) Other (Alexander Comments)    unknown  . Penicillins Swelling    Past Medical History  Diagnosis Date  . Coronary artery disease     Severe two  vessel; s/p CABG  . Hypertension   . Diabetes mellitus     Type 2  . Hyperlipidemia   . Diabetic neuropathy   . PAF (paroxysmal atrial fibrillation)     failed medical therapy with amiodarone  . CVA (cerebral vascular accident) 2004  . Hypothyroidism   . Chronic anemia   . Diastolic dysfunction     per echo in May of 2012  . GERD (gastroesophageal reflux disease)   . Arthritis     Past Surgical History  Procedure Date  . Coronary artery bypass graft 06/2009    X3, LIMA to LAD, SVG to diagonal, SVG to the posterior descending artery.  . Tonsillectomy   . Knee arthroscopy   . Transthoracic echocardiogram 11/12/2010     Ejection fraction felt to be around 50%.  Wall thickness was increased in a pattern of mild LVH.  Moderately dilated left atrium.  Mildly dilated right atrium. Right ventricle was mildly dilated with mildly reduced systolic function  . Cardiac catheterization 06/09/2009    inferior wall hypokinesia with ejection fraction of 55%.  . Abdominal hysterectomy   . Joint replacement     History  Smoking status  . Former Smoker  Smokeless tobacco  . Former Neurosurgeon  . Quit date: 07/04/1972    History  Alcohol Use No    Family History  Problem Relation Age of Onset  . Colon cancer Mother   . Stroke Mother   . Hypertension Mother   . Cancer Father   . Heart attack Father     x2    Review of Systems: The review of systems is positive for chronic lower extremity edema.  No recurrent syncope.   All other systems were reviewed and are negative.  Physical Exam: BP 136/86  Pulse 121  Ht 5\' 3"  (1.6 m)  Wt 218 lb 1.9 oz (98.939 kg)  BMI 38.64 kg/m2 Patient is pleasant and in no acute distress. She is obese. She is a little unsteady on her feet. Skin is warm and dry. Color is normal.  HEENT is unremarkable. Normocephalic/atraumatic. PERRL. Sclera are nonicteric. Neck is supple. No masses. No JVD. Lungs are clear. Cardiac exam shows a rapid and regular rate and  rhythm. Abdomen is obese but soft. Extremities are with 1-2+ pedal edema. Gait and ROM are intact. No gross neurologic deficits noted.  LABORATORY DATA:  ECG demonstrates an atrial tachycardia with a rate of 120 beats per minute. Shows a right bundle branch block.  Assessment / Plan:

## 2011-05-30 NOTE — Assessment & Plan Note (Signed)
I reviewed her ECG today with Dr. Johney Frame. We feel that this is an atrial tachycardia. It is similar to her sinus P wave but her rate is much increased. We will focus on rate control with increasing her metoprolol to 50 mg twice a day. Diltiazem may be problematic given her right heart dysfunction. Fortunately she is currently asymptomatic. We will followup again in one month.

## 2011-05-30 NOTE — Telephone Encounter (Signed)
Patient called, advised Dr. Swaziland wants you to keep your appointment

## 2011-05-30 NOTE — Assessment & Plan Note (Signed)
She has right ventricular dysfunction with severe tricuspid insufficiency and moderate right atrial enlargement. I suspect that her significant edema postoperatively may have been related to some of this right ventricular dysfunction. Her edema has improved and I've recommended continuing on her current Lasix dose. She needs to continue to abstain from sodium. We had a long discussion today concerning the findings on her TEE including her tricuspid and mitral insufficiency. Anticipate that her treatment will be medical only.

## 2011-05-30 NOTE — Assessment & Plan Note (Signed)
She has had no recurrence of atrial fibrillation since amiodarone was discontinued. However she is down atrial tachycardia. She was anticoagulated on Coumadin that is being followed by Dr. Ferd Hibbs office. She is not a candidate for ablative therapy. She's also not a candidate for antiarrhythmic drug therapy. We'll need to focus on rate control.

## 2011-05-31 ENCOUNTER — Encounter (HOSPITAL_COMMUNITY): Payer: Self-pay | Admitting: Cardiology

## 2011-06-01 MED ORDER — METOPROLOL TARTRATE 50 MG PO TABS: 50.0000 mg | ORAL_TABLET | Freq: Two times a day (BID) | ORAL | Status: DC

## 2011-06-01 NOTE — Progress Notes (Signed)
Addended by: Royanne Foots on: 06/01/2011 10:55 AM   Modules accepted: Orders

## 2011-06-16 ENCOUNTER — Other Ambulatory Visit (HOSPITAL_BASED_OUTPATIENT_CLINIC_OR_DEPARTMENT_OTHER): Payer: Medicare Other | Admitting: Lab

## 2011-06-16 ENCOUNTER — Ambulatory Visit (HOSPITAL_BASED_OUTPATIENT_CLINIC_OR_DEPARTMENT_OTHER): Payer: TRICARE For Life (TFL)

## 2011-06-16 ENCOUNTER — Other Ambulatory Visit (HOSPITAL_COMMUNITY): Payer: Self-pay | Admitting: Oncology

## 2011-06-16 VITALS — BP 137/70 | HR 80 | Temp 98.4°F

## 2011-06-16 DIAGNOSIS — N189 Chronic kidney disease, unspecified: Secondary | ICD-10-CM

## 2011-06-16 DIAGNOSIS — D638 Anemia in other chronic diseases classified elsewhere: Secondary | ICD-10-CM

## 2011-06-16 DIAGNOSIS — D631 Anemia in chronic kidney disease: Secondary | ICD-10-CM

## 2011-06-16 DIAGNOSIS — E538 Deficiency of other specified B group vitamins: Secondary | ICD-10-CM

## 2011-06-16 DIAGNOSIS — D649 Anemia, unspecified: Secondary | ICD-10-CM

## 2011-06-16 LAB — CBC WITH DIFFERENTIAL/PLATELET
Basophils Absolute: 0.1 10*3/uL (ref 0.0–0.1)
EOS%: 3.4 % (ref 0.0–7.0)
HCT: 33.3 % — ABNORMAL LOW (ref 34.8–46.6)
HGB: 10.7 g/dL — ABNORMAL LOW (ref 11.6–15.9)
LYMPH%: 29.7 % (ref 14.0–49.7)
MCH: 26.7 pg (ref 25.1–34.0)
MCV: 83 fL (ref 79.5–101.0)
MONO%: 18 % — ABNORMAL HIGH (ref 0.0–14.0)
NEUT%: 47.8 % (ref 38.4–76.8)
Platelets: 161 10*3/uL (ref 145–400)

## 2011-06-16 MED ORDER — DARBEPOETIN ALFA-POLYSORBATE 500 MCG/ML IJ SOLN
200.0000 ug | Freq: Once | INTRAMUSCULAR | Status: AC
  Administered 2011-06-16: 200 ug via SUBCUTANEOUS
  Filled 2011-06-16: qty 1

## 2011-06-29 ENCOUNTER — Telehealth: Payer: Self-pay | Admitting: Cardiology

## 2011-06-29 DIAGNOSIS — R0602 Shortness of breath: Secondary | ICD-10-CM

## 2011-06-29 DIAGNOSIS — I4891 Unspecified atrial fibrillation: Secondary | ICD-10-CM

## 2011-06-29 NOTE — Telephone Encounter (Signed)
New message:  Husband called and has some questions.  Pt has a fib and went into hospital for procedures.  If her heart speeds up again what does she need to do/take?  Can patient travel to [facility] on Monday.  When should she see Dr. Swaziland again?    Please call and if not home, leave detail message.

## 2011-06-29 NOTE — Telephone Encounter (Signed)
Husband called stating they are going to [facility] over the weekend and wants to cancel app w/Dr. Swaziland on 12/31. Wanted to reschedule but advised did not have an appointment available until end of January. Offered her an app w/Lori on 1/4. Also will have lab work. He also wanted to know what to do if her heart went out of rhythm again. Advised may take extra 25 mg of Metoprolol but if HR is greater than 100 for an hour will need to go to ER. Will see Lawson Fiscal on 1/4.

## 2011-07-04 ENCOUNTER — Other Ambulatory Visit: Payer: Medicare Other | Admitting: *Deleted

## 2011-07-04 ENCOUNTER — Ambulatory Visit: Payer: Medicare Other | Admitting: Cardiology

## 2011-07-08 ENCOUNTER — Other Ambulatory Visit (INDEPENDENT_AMBULATORY_CARE_PROVIDER_SITE_OTHER): Payer: Medicare Other | Admitting: *Deleted

## 2011-07-08 ENCOUNTER — Ambulatory Visit (INDEPENDENT_AMBULATORY_CARE_PROVIDER_SITE_OTHER): Payer: Medicare Other | Admitting: Nurse Practitioner

## 2011-07-08 ENCOUNTER — Encounter: Payer: Self-pay | Admitting: Nurse Practitioner

## 2011-07-08 VITALS — BP 122/60 | HR 66 | Ht 64.0 in | Wt 227.0 lb

## 2011-07-08 DIAGNOSIS — I4891 Unspecified atrial fibrillation: Secondary | ICD-10-CM

## 2011-07-08 DIAGNOSIS — I471 Supraventricular tachycardia: Secondary | ICD-10-CM

## 2011-07-08 DIAGNOSIS — I2510 Atherosclerotic heart disease of native coronary artery without angina pectoris: Secondary | ICD-10-CM

## 2011-07-08 DIAGNOSIS — Z951 Presence of aortocoronary bypass graft: Secondary | ICD-10-CM

## 2011-07-08 DIAGNOSIS — I4892 Unspecified atrial flutter: Secondary | ICD-10-CM

## 2011-07-08 DIAGNOSIS — I509 Heart failure, unspecified: Secondary | ICD-10-CM

## 2011-07-08 DIAGNOSIS — R0602 Shortness of breath: Secondary | ICD-10-CM

## 2011-07-08 DIAGNOSIS — I50810 Right heart failure, unspecified: Secondary | ICD-10-CM

## 2011-07-08 DIAGNOSIS — Z7901 Long term (current) use of anticoagulants: Secondary | ICD-10-CM

## 2011-07-08 DIAGNOSIS — R0609 Other forms of dyspnea: Secondary | ICD-10-CM

## 2011-07-08 DIAGNOSIS — I071 Rheumatic tricuspid insufficiency: Secondary | ICD-10-CM

## 2011-07-08 DIAGNOSIS — R0600 Dyspnea, unspecified: Secondary | ICD-10-CM

## 2011-07-08 DIAGNOSIS — I498 Other specified cardiac arrhythmias: Secondary | ICD-10-CM

## 2011-07-08 LAB — BASIC METABOLIC PANEL
BUN: 26 mg/dL — ABNORMAL HIGH (ref 6–23)
Calcium: 8.4 mg/dL (ref 8.4–10.5)
Creatinine, Ser: 1.7 mg/dL — ABNORMAL HIGH (ref 0.4–1.2)
GFR: 38.28 mL/min — ABNORMAL LOW (ref >60.00)
Glucose, Bld: 105 mg/dL — ABNORMAL HIGH (ref 70–99)
Potassium: 4 mEq/L (ref 3.5–5.1)

## 2011-07-08 MED ORDER — FUROSEMIDE 20 MG PO TABS: ORAL_TABLET | ORAL | Status: DC

## 2011-07-08 NOTE — Assessment & Plan Note (Signed)
She remains on her coumadin. INR's are checked at [facility].

## 2011-07-08 NOTE — Assessment & Plan Note (Signed)
Currently not having chest pain.  

## 2011-07-08 NOTE — Progress Notes (Signed)
[name] Date of Birth: [birth year] Medical Record #[id number]  History of Present Illness: [name] is seen today for a follow up visit. It is a one month check. She is seen for Dr. [place]. She is here with her husband. She had to reschedule her appointment because they went to the beach for the New Year.  She has multiple issues. She has known CAD with prior CABG. She has PAF. On chronic coumadin. Intolerant to antiarrhythmic therapy and now not a candidate for ablation. Has severe TR with moderate mitral insufficiency.   She comes in today. She is a little more short of breath. Has more swelling. She has increased her Lasix to 20 mg BID with no real response. Has not been watching her salt. Eats country ham. Has been to the beach. Drinks nonalcoholic beer with salt. Using the salt shaker at home. Her weight is up 9 pounds. No chest pain. No palpitations. She has been advised to take an extra 25 mg of metoprolol if she has an elevated heart rate. This has not happened.   Current Outpatient Prescriptions on File Prior to Visit  Medication Sig Dispense Refill  . amitriptyline (ELAVIL) 25 MG tablet Take 75 mg by mouth at bedtime.        Marland Kitchen aspirin 81 MG tablet Take 81 mg by mouth daily.        Marland Kitchen atorvastatin (LIPITOR) 40 MG tablet Take 40 mg by mouth daily.        . Cholecalciferol (VITAMIN D) 2000 UNITS tablet Take 2,000 Units by mouth daily.        . Cyanocobalamin (VITAMIN B 12 PO) Take 1 tablet by mouth daily.        . darbepoetin (ARANESP) 300 MCG/0.6ML SOLN Inject 300 mcg into the skin every 30 (thirty) days.        Marland Kitchen esomeprazole (NEXIUM) 40 MG capsule Take 40 mg by mouth daily before breakfast.        . fish oil-omega-3 fatty acids 1000 MG capsule Take 1 g by mouth daily.       Marland Kitchen glimepiride (AMARYL) 4 MG tablet Take 4 mg by mouth 2 (two) times daily.       Marland Kitchen HYDROcodone-homatropine (HYCODAN) 5-1.5 MG/5ML syrup Take 1.5 mLs by mouth daily as needed. For cough.      . insulin  glargine (LANTUS) 100 UNIT/ML injection Inject 60 Units into the skin at bedtime.       . insulin lispro (HUMALOG) 100 UNIT/ML injection Inject 28 Units into the skin 3 (three) times daily before meals.       . IRON PO Take 1 tablet by mouth daily.       Marland Kitchen levothyroxine (SYNTHROID, LEVOTHROID) 137 MCG tablet Take 137 mcg by mouth daily.       Marland Kitchen MAGNESIUM PO Take 1 tablet by mouth daily.       . metoprolol (LOPRESSOR) 50 MG tablet Take 1 tablet (50 mg total) by mouth 2 (two) times daily.  60 tablet  11  . warfarin (COUMADIN) 5 MG tablet Take 5-10 mg by mouth daily. Pt takes 5 mg tablet on Wednesday only. On Sunday, Monday, Tuesday, and Thursday pt takes 10 mg (2 tablets.)      . DISCONTD: furosemide (LASIX) 20 MG tablet Take 20 mg by mouth 2 (two) times daily.       Marland Kitchen DISCONTD: enoxaparin (LOVENOX) 100 MG/ML SOLN Inject into the skin every 12 (twelve) hours.        Marland Kitchen  DISCONTD: simvastatin (ZOCOR) 80 MG tablet Take 80 mg by mouth at bedtime.          Allergies  Allergen Reactions  . Betadine (Povidone Iodine) Itching  . Capoten (Captopril) Other (Alexander Comments)    unknown  . Darvocet (Propoxyphene N-Acetaminophen) Other (Alexander Comments)    unknown  . Penicillins Swelling    Past Medical History  Diagnosis Date  . Coronary artery disease     Severe two vessel; s/p CABG  . Hypertension   . Diabetes mellitus     Type 2  . Hyperlipidemia   . Diabetic neuropathy   . PAF (paroxysmal atrial fibrillation)     failed medical therapy with amiodarone; not a candidate for class IC meds due to her CAD. No Multaq due to CHF hx; QT prolonged with amiodarone; Not able to be ablated due to valve disease  . CVA (cerebral vascular accident) 2004  . Hypothyroidism   . Chronic anemia   . Diastolic dysfunction     per echo in May of 2012  . GERD (gastroesophageal reflux disease)   . Arthritis   . Valvular heart disease     has moderate mitral insufficiency with severe tricuspid insufficiency per TEE  in November 2012    Past Surgical History  Procedure Date  . Coronary artery bypass graft 06/2009    X3, LIMA to LAD, SVG to diagonal, SVG to the posterior descending artery.  . Tonsillectomy   . Knee arthroscopy   . Transthoracic echocardiogram 11/12/2010     Ejection fraction felt to be around 50%.  Wall thickness was increased in a pattern of mild LVH.  Moderately dilated left atrium.  Mildly dilated right atrium. Right ventricle was mildly dilated with mildly reduced systolic function  . Cardiac catheterization 06/09/2009    inferior wall hypokinesia with ejection fraction of 55%.  . Abdominal hysterectomy   . Joint replacement   . Tee without cardioversion 05/23/2011    Procedure: TRANSESOPHAGEAL ECHOCARDIOGRAM (TEE);  Surgeon: Lewayne Bunting, MD;  Location: [facility] ENDOSCOPY;  Service: Cardiovascular;  Laterality: N/A;    History  Smoking status  . Former Smoker  Smokeless tobacco  . Former Neurosurgeon  . Quit date: 07/04/1972    History  Alcohol Use No    Family History  Problem Relation Age of Onset  . Colon cancer Mother   . Stroke Mother   . Hypertension Mother   . Cancer Father   . Heart attack Father     x2    Review of Systems: The review of systems is positive for swelling and DOE.  All other systems were reviewed and are negative.  Physical Exam: BP 122/60  Pulse 66  Ht 5\' 4"  (1.626 m)  Wt 227 lb (102.967 kg)  BMI 38.96 kg/m2  SpO2 98% Patient is very pleasant and in no acute distress. She is obese. Skin is warm and dry. Color is normal.  HEENT is unremarkable. Normocephalic/atraumatic. PERRL. Sclera are nonicteric. Neck is supple. No masses. No JVD. Lungs are fairly clear. Cardiac exam shows a regular rate and rhythm today. Rate is in the 60's. She has a diastolic murmur noted today. Heart tones are distant. Abdomen is obese but soft. Extremities are with 1 to 2+ ankle edema. Gait and ROM are intact. No gross neurologic deficits noted.  LABORATORY DATA: BMET  and BNP are pending.    Assessment / Plan:

## 2011-07-08 NOTE — Assessment & Plan Note (Signed)
Has more swelling and her weight is up. I do not think she understands the necessity for sodium restriction. Medical therapy is really her only options per Dr. Elvis Coil last note. I have increased her Lasix to 40 mg in the am and 20 mg in the pm for one week and then back to 20 mg BID. I will see her back in a week with Dr. [place]. We are checking a BMET and BNP today. Once again, she is encouraged to cut back on her sodium use. Patient is agreeable to this plan and will call if any problems develop in the interim.

## 2011-07-08 NOTE — Assessment & Plan Note (Signed)
In a regular rhythm with a controlled rate on physical exam today.

## 2011-07-08 NOTE — Patient Instructions (Signed)
We need to increase the Lasix 40 mg in the morning and 20 mg in the evening.  Really cut back on the salt. Goal is less than 2000mg  of sodium each day.  Stay on your other medicines.  We will see you in about 1 week.  Call the [facility] office at [phone] if you have any questions, problems or concerns.

## 2011-07-12 ENCOUNTER — Encounter: Payer: Self-pay | Admitting: Cardiology

## 2011-07-14 ENCOUNTER — Other Ambulatory Visit (HOSPITAL_BASED_OUTPATIENT_CLINIC_OR_DEPARTMENT_OTHER): Payer: Medicare Other | Admitting: Lab

## 2011-07-14 ENCOUNTER — Other Ambulatory Visit (HOSPITAL_COMMUNITY): Payer: Self-pay | Admitting: Oncology

## 2011-07-14 ENCOUNTER — Ambulatory Visit: Payer: Medicare Other

## 2011-07-14 DIAGNOSIS — N189 Chronic kidney disease, unspecified: Secondary | ICD-10-CM

## 2011-07-14 DIAGNOSIS — D649 Anemia, unspecified: Secondary | ICD-10-CM

## 2011-07-14 DIAGNOSIS — E538 Deficiency of other specified B group vitamins: Secondary | ICD-10-CM

## 2011-07-14 LAB — CBC WITH DIFFERENTIAL/PLATELET
BASO%: 0.8 % (ref 0.0–2.0)
EOS%: 4.2 % (ref 0.0–7.0)
HCT: 34.7 % — ABNORMAL LOW (ref 34.8–46.6)
LYMPH%: 41.5 % (ref 14.0–49.7)
MCH: 27.2 pg (ref 25.1–34.0)
MCHC: 31.7 g/dL (ref 31.5–36.0)
MONO#: 0.8 10*3/uL (ref 0.1–0.9)
MONO%: 13.3 % (ref 0.0–14.0)
NEUT%: 40.2 % (ref 38.4–76.8)
Platelets: 185 10*3/uL (ref 145–400)
RBC: 4.05 10*6/uL (ref 3.70–5.45)
WBC: 6 10*3/uL (ref 3.9–10.3)
nRBC: 0 % (ref 0–0)

## 2011-07-14 MED ORDER — DARBEPOETIN ALFA-POLYSORBATE 500 MCG/ML IJ SOLN: 200.0000 ug | Freq: Once | INTRAMUSCULAR | Status: DC

## 2011-07-15 LAB — COMPREHENSIVE METABOLIC PANEL
ALT: 19 U/L (ref 0–35)
AST: 31 U/L (ref 0–37)
Alkaline Phosphatase: 100 U/L (ref 39–117)
Creatinine, Ser: 1.75 mg/dL — ABNORMAL HIGH (ref 0.50–1.10)
Total Bilirubin: 0.5 mg/dL (ref 0.3–1.2)

## 2011-07-15 LAB — LACTATE DEHYDROGENASE: LDH: 183 U/L (ref 94–250)

## 2011-07-15 LAB — VITAMIN B12: Vitamin B-12: 1284 pg/mL — ABNORMAL HIGH (ref 211–911)

## 2011-07-15 LAB — IGG, IGA, IGM: IgG (Immunoglobin G), Serum: 1980 mg/dL — ABNORMAL HIGH (ref 690–1700)

## 2011-07-15 LAB — IRON AND TIBC
%SAT: 19 % — ABNORMAL LOW (ref 20–55)
TIBC: 341 ug/dL (ref 250–470)
UIBC: 275 ug/dL (ref 125–400)

## 2011-07-18 ENCOUNTER — Encounter: Payer: Self-pay | Admitting: Nurse Practitioner

## 2011-07-18 ENCOUNTER — Ambulatory Visit (INDEPENDENT_AMBULATORY_CARE_PROVIDER_SITE_OTHER): Payer: Medicare Other | Admitting: Nurse Practitioner

## 2011-07-18 VITALS — BP 108/78 | HR 126 | Ht 63.0 in | Wt 215.0 lb

## 2011-07-18 DIAGNOSIS — I50810 Right heart failure, unspecified: Secondary | ICD-10-CM

## 2011-07-18 DIAGNOSIS — I4891 Unspecified atrial fibrillation: Secondary | ICD-10-CM

## 2011-07-18 DIAGNOSIS — I498 Other specified cardiac arrhythmias: Secondary | ICD-10-CM

## 2011-07-18 DIAGNOSIS — I509 Heart failure, unspecified: Secondary | ICD-10-CM

## 2011-07-18 DIAGNOSIS — I471 Supraventricular tachycardia: Secondary | ICD-10-CM

## 2011-07-18 MED ORDER — METOPROLOL TARTRATE 50 MG PO TABS: ORAL_TABLET | ORAL | Status: DC

## 2011-07-18 NOTE — Patient Instructions (Signed)
Increase the metoprolol to 75 mg in the am and 50 mg at night.  Stay on your other medicines.  Stay off the salt.  We will check your potassium level today.  We will see you in a month.   Call the [facility] office at [phone] if you have any questions, problems or concerns.

## 2011-07-18 NOTE — Assessment & Plan Note (Signed)
Weight is down. She is to cut her lasix back to her usual dose. We are checking a BMET today. She does look better overall.

## 2011-07-18 NOTE — Assessment & Plan Note (Signed)
Her rate is faster today. She is not a candidate for antiarrhythmic therapy nor ablation. We will continue with medical management. I have talked with Dr. Johney Frame. We will increase her metoprolol to 75 mg in the am and 50 mg in the evening. Fortunately, she is feeling much better. Will recheck her potassium levels today. We will see her back in a month. Patient is agreeable to this plan and will call if any problems develop in the interim.

## 2011-07-18 NOTE — Progress Notes (Signed)
[name] Date of Birth: January 31, 1933 Medical Record #[id number]  History of Present Illness: [name] is seen today for a one week check. She is seen for Dr. [place]. She is here with her husband. We increased her lasix for one week at her last visit. She has had much salt indiscretion. Now her weight is back down. She feels pretty good. Her swelling is better. She says her "heart feels quiet". She is starting some "memory pill". She denies chest pain. She says she feels better.  She does have multiple issues. She has known CAD with prior CABG. She has PAF. On chronic coumadin. Intolerant to antiarrhythmic therapy and now is not a candidate for ablation with her severe TR and moderate mitral insufficiency.   Current Outpatient Prescriptions on File Prior to Visit  Medication Sig Dispense Refill  . amitriptyline (ELAVIL) 25 MG tablet Take 75 mg by mouth at bedtime.        Marland Kitchen aspirin 81 MG tablet Take 81 mg by mouth daily.        Marland Kitchen atorvastatin (LIPITOR) 40 MG tablet Take 40 mg by mouth daily.        . Cholecalciferol (VITAMIN D) 2000 UNITS tablet Take 2,000 Units by mouth daily.        . Cyanocobalamin (VITAMIN B 12 PO) Take 1 tablet by mouth daily.        . darbepoetin (ARANESP) 300 MCG/0.6ML SOLN Inject 300 mcg into the skin every 30 (thirty) days.        Marland Kitchen esomeprazole (NEXIUM) 40 MG capsule Take 40 mg by mouth daily before breakfast.        . fish oil-omega-3 fatty acids 1000 MG capsule Take 1 g by mouth daily.       . furosemide (LASIX) 20 MG tablet Take 40 mg in the am and 20 mg in the pm for one week, then back to 20 mg BID  90 tablet  6  . glimepiride (AMARYL) 4 MG tablet Take 4 mg by mouth 2 (two) times daily.       Marland Kitchen HYDROcodone-homatropine (HYCODAN) 5-1.5 MG/5ML syrup Take 1.5 mLs by mouth daily as needed. For cough.      . insulin glargine (LANTUS) 100 UNIT/ML injection Inject 60 Units into the skin at bedtime.       . insulin lispro (HUMALOG) 100 UNIT/ML injection Inject 28  Units into the skin 3 (three) times daily before meals.       . IRON PO Take 1 tablet by mouth daily.       Marland Kitchen MAGNESIUM PO Take 1 tablet by mouth daily.       Marland Kitchen warfarin (COUMADIN) 5 MG tablet Take 5-10 mg by mouth daily. Pt takes 5 mg tablet on Wednesday only. On Sunday, Monday, Tuesday, and Thursday pt takes 10 mg (2 tablets.)      . DISCONTD: metoprolol (LOPRESSOR) 50 MG tablet Take 1 tablet (50 mg total) by mouth 2 (two) times daily.  60 tablet  11  . DISCONTD: enoxaparin (LOVENOX) 100 MG/ML SOLN Inject into the skin every 12 (twelve) hours.        Marland Kitchen DISCONTD: simvastatin (ZOCOR) 80 MG tablet Take 80 mg by mouth at bedtime.          Allergies  Allergen Reactions  . Betadine (Povidone Iodine) Itching  . Capoten (Captopril) Other (Alexander Comments)    unknown  . Darvocet (Propoxyphene N-Acetaminophen) Other (Alexander Comments)    unknown  . Penicillins  Swelling    Past Medical History  Diagnosis Date  . Coronary artery disease     Severe two vessel; s/p CABG  . Hypertension   . Diabetes mellitus     Type 2  . Hyperlipidemia   . Diabetic neuropathy   . PAF (paroxysmal atrial fibrillation)     failed medical therapy with amiodarone; not a candidate for class IC meds due to her CAD. No Multaq due to CHF hx; QT prolonged with amiodarone; Not able to be ablated due to valve disease  . CVA (cerebral vascular accident) 2004  . Hypothyroidism   . Chronic anemia   . Diastolic dysfunction     per echo in May of 2012  . GERD (gastroesophageal reflux disease)   . Arthritis   . Valvular heart disease     has moderate mitral insufficiency with severe tricuspid insufficiency per TEE in November 2012    Past Surgical History  Procedure Date  . Coronary artery bypass graft 06/2009    X3, LIMA to LAD, SVG to diagonal, SVG to the posterior descending artery.  . Tonsillectomy   . Knee arthroscopy   . Transthoracic echocardiogram 11/12/2010     Ejection fraction felt to be around 50%.  Wall  thickness was increased in a pattern of mild LVH.  Moderately dilated left atrium.  Mildly dilated right atrium. Right ventricle was mildly dilated with mildly reduced systolic function  . Cardiac catheterization 06/09/2009    inferior wall hypokinesia with ejection fraction of 55%.  . Abdominal hysterectomy   . Joint replacement   . Tee without cardioversion 05/23/2011    Procedure: TRANSESOPHAGEAL ECHOCARDIOGRAM (TEE);  Surgeon: Lewayne Bunting, MD;  Location: [facility] ENDOSCOPY;  Service: Cardiovascular;  Laterality: N/A;    History  Smoking status  . Former Smoker  Smokeless tobacco  . Former Neurosurgeon  . Quit date: 07/04/1972    History  Alcohol Use No    Family History  Problem Relation Age of Onset  . Colon cancer Mother   . Stroke Mother   . Hypertension Mother   . Cancer Father   . Heart attack Father     x2    Review of Systems: The review of systems is positive for chronic edema. No awareness of her heart at this time.  All other systems were reviewed and are negative.  Physical Exam: BP 108/78  Pulse 126  Ht 5\' 3"  (1.6 m)  Wt 215 lb (97.523 kg)  BMI 38.09 kg/m2 Patient is very pleasant and in no acute distress. She is well dressed. Skin is warm and dry. Color is normal.  HEENT is unremarkable. Normocephalic/atraumatic. PERRL. Sclera are nonicteric. Neck is supple. No masses. No JVD. Lungs are clear. Cardiac exam shows a regular but fast rhythm today. Abdomen is obese but soft. Extremities are with less edema. Gait and ROM are intact. No gross neurologic deficits noted.   LABORATORY DATA: BMET is pending.    Assessment / Plan:

## 2011-07-19 LAB — BASIC METABOLIC PANEL
BUN: 21 mg/dL (ref 6–23)
CO2: 28 mEq/L (ref 19–32)
Calcium: 8.7 mg/dL (ref 8.4–10.5)
Chloride: 98 mEq/L (ref 96–112)
Creatinine, Ser: 1.6 mg/dL — ABNORMAL HIGH (ref 0.4–1.2)
GFR: 41.72 mL/min — ABNORMAL LOW (ref >60.00)
Glucose, Bld: 309 mg/dL — ABNORMAL HIGH (ref 70–99)
Potassium: 3.9 mEq/L (ref 3.5–5.1)
Sodium: 137 mEq/L (ref 135–145)

## 2011-07-20 ENCOUNTER — Telehealth: Payer: Self-pay | Admitting: Cardiology

## 2011-07-20 NOTE — Telephone Encounter (Signed)
Patient called stated she had severe cramping in lower legs,thighs,hands last night lasting appox 4 hours.Stated the worse cramps ever.Spoke with Norma Fredrickson NP advised to try tonic water if needed,contiue furosemide 20 mg twice a day.Potassium 3.9 on 07/19/11.

## 2011-07-20 NOTE — Telephone Encounter (Signed)
New Problem   Patient is having problems with new prescriptions, please return call at hm#

## 2011-08-10 ENCOUNTER — Encounter: Payer: Self-pay | Admitting: Medical Oncology

## 2011-08-11 ENCOUNTER — Other Ambulatory Visit (HOSPITAL_BASED_OUTPATIENT_CLINIC_OR_DEPARTMENT_OTHER): Payer: Medicare Other | Admitting: Lab

## 2011-08-11 ENCOUNTER — Ambulatory Visit (HOSPITAL_BASED_OUTPATIENT_CLINIC_OR_DEPARTMENT_OTHER): Payer: Medicare Other | Admitting: Oncology

## 2011-08-11 ENCOUNTER — Encounter: Payer: Self-pay | Admitting: Oncology

## 2011-08-11 ENCOUNTER — Telehealth: Payer: Self-pay | Admitting: Oncology

## 2011-08-11 VITALS — BP 133/65 | HR 59 | Temp 98.3°F | Ht 63.0 in | Wt 226.9 lb

## 2011-08-11 DIAGNOSIS — D631 Anemia in chronic kidney disease: Secondary | ICD-10-CM

## 2011-08-11 DIAGNOSIS — D649 Anemia, unspecified: Secondary | ICD-10-CM

## 2011-08-11 DIAGNOSIS — D539 Nutritional anemia, unspecified: Secondary | ICD-10-CM

## 2011-08-11 DIAGNOSIS — N189 Chronic kidney disease, unspecified: Secondary | ICD-10-CM

## 2011-08-11 DIAGNOSIS — D472 Monoclonal gammopathy: Secondary | ICD-10-CM

## 2011-08-11 LAB — CBC WITH DIFFERENTIAL/PLATELET
BASO%: 0.7 % (ref 0.0–2.0)
Eosinophils Absolute: 0.2 10*3/uL (ref 0.0–0.5)
HCT: 32.9 % — ABNORMAL LOW (ref 34.8–46.6)
LYMPH%: 34.6 % (ref 14.0–49.7)
MONO#: 0.8 10*3/uL (ref 0.1–0.9)
NEUT#: 2.7 10*3/uL (ref 1.5–6.5)
NEUT%: 47 % (ref 38.4–76.8)
Platelets: 125 10*3/uL — ABNORMAL LOW (ref 145–400)
WBC: 5.7 10*3/uL (ref 3.9–10.3)
lymph#: 2 10*3/uL (ref 0.9–3.3)
nRBC: 0 % (ref 0–0)

## 2011-08-11 MED ORDER — DARBEPOETIN ALFA-POLYSORBATE 200 MCG/0.4ML IJ SOLN
200.0000 ug | Freq: Once | INTRAMUSCULAR | Status: AC
  Administered 2011-08-11: 200 ug via SUBCUTANEOUS
  Filled 2011-08-11: qty 1

## 2011-08-11 NOTE — Telephone Encounter (Signed)
[facility] lab/injs appts made and will print and mail to pt  aom

## 2011-08-11 NOTE — Progress Notes (Signed)
This office note has been dictated.  #[id number]

## 2011-08-11 NOTE — Progress Notes (Signed)
CC:   [name], M.D. [name], MD  PROBLEM LIST: 1. Anemia felt to be secondary to chronic renal disease dating back to     January 2003.  This patient also has a history of iron-deficiency     anemia as well as vitamin B12 deficiency.  She has received     intravenous iron in October 2004 and September of 2008.  Currently     she is on oral vitamin B12. 2. 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 3. Angiodysplasia with history of Hemoccult-positive stools. 4. History of elevated ANA 1:160, atypical speckled dating back to     November 2005. 5. Coronary artery disease status post CABG by Dr. Evelene Croon on     June 11, 2009. 6. Tricuspid insufficiency by TEE in October 2012. 7. Atrial fibrillation/atrial flutter. 8. Anticoagulation therapy with Coumadin. 9. Renal insufficiency. 10.History of syncope late October 2012. 11.History of acute right pontine stroke 04/09/2003. 12.Diabetes mellitus. 13.Hypertension. 14.Dyslipidemia. 15.Hypothyroidism. 16.Gastroesophageal reflux disease. 17.Osteoarthritis. 18.Right total knee replacement 12/03/2003. 19.Abdominal hernia.  MEDICATIONS: 1. Elavil 75 mg h.s. 2. Aspirin 81 mg daily. 3. Lipitor 40 mg daily. 4. Vitamin D 2000 units daily. 5. Vitamin B12 1000 mcg daily. 6. Aranesp 200 mcg subcu every 4 weeks for hemoglobin less than or     equal to 11. 7. Aricept 5 mg at bedtime. 8. Nexium 40 mg daily. 9. Omega-3 fish oil 1000 mg daily. 10.Lasix 40 mg in the morning, 20 mg in the evening for 1 week and     then 20 mg twice a day. 11.Amaryl 4 mg twice daily. 12.Hydrocodone syrup 5 mg per 5 mL, take 1.5 mL daily as needed for     cough. 13.Lantus insulin 60 units at bedtime. 14.Lispro insulin 28 units 3 times a day before meals. 15.Iron daily. 16.Synthroid 100 mcg daily. 17.Magnesium daily. 18.Lopressor 75 mg in the morning, 50 mg in the evening. 19.Coumadin 5 mg  daily except Mondays and Fridays when she takes 10     mg.  HISTORY:  [name] is a 78 year old African American female followed by [place] since the fall of 2004 for anemia which has been multifactorial in the past, most recently due to chronic renal disease. The patient has a multitude of problems.  She is followed closely for her cardiac disease by Dr. Peter Alexander.  She tells me that she was hospitalized in late October with a syncopal episode.  She said that this was due to medicines.  She apparently had a TEE at that time that showed severe tricuspid insufficiency.  She has atrial arrhythmias and is on Coumadin.  She does have angiodysplasia and a history of a positive stools.  I believe in the past she has been seen by Dr. Ritta Slot.  All things considered, the patient seems to be doing reasonably well.  She is without any major complaints today and seems to be at her normal baseline.  She comes here every 4 weeks for CBC and receives Aranesp 200 mcg subcu whenever her hemoglobin is less than or equal to 11.  For the most part, the patient's hemoglobin runs between 9.5 and 11, and most the time she is receiving Aranesp.  PHYSICAL EXAM:  General:  The patient shows little change.  She is in no acute distress.  Vital signs:  Her weight is 226 pounds, 14.4 ounces. Height 5 feet 3 inches, body surface area 2.14  m squared.  Blood pressure 133/65.  Pulse is 59 and regular, respirations regular and unlabored.  There is no scleral icterus.  Mouth and pharynx are benign. There is no peripheral adenopathy palpable.  Lungs:  A few rales at the right base.  Cardiac:  Systolic ejection murmur.  I thought the rhythm was regular.  Abdomen:  Obese with the patient sitting.  I believe she does have an umbilical hernia that has been noted in the past. Previously this measured 6-8 cm.  No obvious organomegaly.  Extremities: Chronic edema with stasis changes.  Her legs are very tight and  non pitting.  Neurologic:  Seems to be grossly normal.  Apparently the patient has some short-term memory loss and, for that reason, is on Aranesp.  LABORATORY DATA:  Today Hamidi count 5.7, ANC 2.7, hemoglobin 10.6, hematocrit 32.9, platelets a 125,000 today.  On 07/14/2011 the platelet count was a 185,000, hemoglobin 11.0 and on 06/16/2011, platelets were 161,000, hemoglobin 10.7.  Chemistries today are pending.  Chemistries from 07/14/2011 notable for a BUN of 26, creatinine 1.75 and an estimated GFR of 38.3.  On 07/14/2011 ferritin was 132, iron saturation 19%.  Vitamin B12 level was 1284.  ProBNP on 07/08/2011 was 295. Quantitative immunoglobulins today are pending.  On 07/14/2011 the IgG level was 1980 which is increased over 1640 from 12/30/2010.  The IgA level was 345.  This is in the normal range but it has increased from prior values.  IgM was 165.  IMAGING STUDIES: 1. Two-view chest x-ray on 04/06/2011 showed cardiomegaly, small     effusions and questionable mild congestion. 2. CT scan of the head without IV contrast on 04/06/2011 showed     atrophy and chronic ischemic Wiens matter disease without acute     intracranial abnormality.  There was unchanged small left corona     radiata lacunar infarct.  IMPRESSION AND PLAN:  [name] has a multitude of problems but generally has been stable.  Of concern today is her mild drop in her platelet count.  She also seemed to have a slight increase in her IgG level.  We have been following the patient for monoclonal IgG lambda monoclonal myopathy now for over 8 years and this has been generally stable, felt to be benign.  We will continue to follow this.  [name] will receive Aranesp 200 mcg subcu today.  We will continue to check her CBC every 4 weeks and give Aranesp 200 mcg subcu whenever the hemoglobin is less than or equal to 11.  We will plan to see her again in 6 months at which time we will check CBC, chemistries,  iron studies, vitamin B12 level, and quantitative immunoglobulins.    ______________________________ Samul Dada, M.D. DSM/MEDQ  D:  08/11/2011  T:  08/11/2011  Job:  [id number]

## 2011-08-19 ENCOUNTER — Ambulatory Visit (INDEPENDENT_AMBULATORY_CARE_PROVIDER_SITE_OTHER): Payer: Medicare Other | Admitting: Cardiology

## 2011-08-19 ENCOUNTER — Encounter: Payer: Self-pay | Admitting: Cardiology

## 2011-08-19 VITALS — BP 149/76 | HR 61 | Ht 63.0 in | Wt 226.0 lb

## 2011-08-19 DIAGNOSIS — I509 Heart failure, unspecified: Secondary | ICD-10-CM

## 2011-08-19 DIAGNOSIS — I471 Supraventricular tachycardia: Secondary | ICD-10-CM

## 2011-08-19 DIAGNOSIS — I50810 Right heart failure, unspecified: Secondary | ICD-10-CM

## 2011-08-19 DIAGNOSIS — I498 Other specified cardiac arrhythmias: Secondary | ICD-10-CM

## 2011-08-19 DIAGNOSIS — I4891 Unspecified atrial fibrillation: Secondary | ICD-10-CM

## 2011-08-19 DIAGNOSIS — I2510 Atherosclerotic heart disease of native coronary artery without angina pectoris: Secondary | ICD-10-CM

## 2011-08-19 MED ORDER — FUROSEMIDE 20 MG PO TABS: 20.0000 mg | ORAL_TABLET | Freq: Two times a day (BID) | ORAL | Status: DC

## 2011-08-19 NOTE — Progress Notes (Signed)
[name] Date of Birth: 1933/04/16 Medical Record #[id number]  History of Present Illness: [name] is seen today for a followup visit. She complains that there are several times per week where just walking across the length of her house causes her to become very tired. One day she was walking into the hospital to visit her sister when she had a warm sensation come up from her chest and her head and she had to sit down for a period of time. She notes that she now craves Cheerios. She denies any significant shortness of breath or chest pain. She has gained 7 pounds and she complains that her legs feel very tight. She has noted some skipping or racing in her heart and typically she feels this in her left lower rib cage. She has had no sustained episodes. She reports compliance with sodium restriction.  She does have multiple issues. She has known CAD with prior CABG. She has PAF. On chronic coumadin. Intolerant to antiarrhythmic therapy and now is not a candidate for ablation with her severe TR and moderate mitral insufficiency.   Current Outpatient Prescriptions on File Prior to Visit  Medication Sig Dispense Refill  . amitriptyline (ELAVIL) 25 MG tablet Take 75 mg by mouth at bedtime.        Marland Kitchen aspirin 81 MG tablet Take 81 mg by mouth daily.        Marland Kitchen atorvastatin (LIPITOR) 40 MG tablet Take 40 mg by mouth daily.        . Cholecalciferol (VITAMIN D) 2000 UNITS tablet Take 2,000 Units by mouth daily.        . Cyanocobalamin (VITAMIN B 12 PO) Take 1 tablet by mouth daily.        . darbepoetin (ARANESP) 300 MCG/0.6ML SOLN Inject 300 mcg into the skin every 30 (thirty) days.        Marland Kitchen donepezil (ARICEPT) 5 MG tablet Take 5 mg by mouth at bedtime.      Marland Kitchen esomeprazole (NEXIUM) 40 MG capsule Take 40 mg by mouth daily before breakfast.        . fish oil-omega-3 fatty acids 1000 MG capsule Take 1 g by mouth daily.       Marland Kitchen glimepiride (AMARYL) 4 MG tablet Take 4 mg by mouth 2 (two) times daily.        Marland Kitchen HYDROcodone-homatropine (HYCODAN) 5-1.5 MG/5ML syrup Take 1.5 mLs by mouth daily as needed. For cough.      . insulin glargine (LANTUS) 100 UNIT/ML injection Inject 60 Units into the skin at bedtime.       . insulin lispro (HUMALOG) 100 UNIT/ML injection Inject 28 Units into the skin 3 (three) times daily before meals.       . IRON PO Take 1 tablet by mouth daily.       Marland Kitchen levothyroxine (SYNTHROID, LEVOTHROID) 100 MCG tablet Take 100 mcg by mouth daily.      Marland Kitchen MAGNESIUM PO Take 1 tablet by mouth daily.       . metoprolol (LOPRESSOR) 50 MG tablet Take 1 1/2 tablets in the am and 1 tablet at night.  90 tablet  11  . warfarin (COUMADIN) 5 MG tablet Take 5 mg by mouth daily. 10mg  Mon and Fri and 5 mg all others      . DISCONTD: furosemide (LASIX) 20 MG tablet Take 40 mg in the am and 20 mg in the pm for one week, then back to 20 mg BID  90  tablet  6  . DISCONTD: enoxaparin (LOVENOX) 100 MG/ML SOLN Inject into the skin every 12 (twelve) hours.        Marland Kitchen DISCONTD: simvastatin (ZOCOR) 80 MG tablet Take 80 mg by mouth at bedtime.          Allergies  Allergen Reactions  . Betadine (Povidone Iodine) Itching  . Capoten (Captopril) Other (Alexander Comments)    unknown  . Darvocet (Propoxyphene N-Acetaminophen) Other (Alexander Comments)    unknown  . Penicillins Swelling    Past Medical History  Diagnosis Date  . Coronary artery disease     Severe two vessel; s/p CABG  . Hypertension   . Diabetes mellitus     Type 2  . Hyperlipidemia   . Diabetic neuropathy   . PAF (paroxysmal atrial fibrillation)     failed medical therapy with amiodarone; not a candidate for class IC meds due to her CAD. No Multaq due to CHF hx; QT prolonged with amiodarone; Not able to be ablated due to valve disease  . CVA (cerebral vascular accident) 2004  . Hypothyroidism   . Chronic anemia   . Diastolic dysfunction     per echo in May of 2012  . GERD (gastroesophageal reflux disease)   . Arthritis   . Valvular heart  disease     has moderate mitral insufficiency with severe tricuspid insufficiency per TEE in November 2012    Past Surgical History  Procedure Date  . Coronary artery bypass graft 06/2009    X3, LIMA to LAD, SVG to diagonal, SVG to the posterior descending artery.  . Tonsillectomy   . Knee arthroscopy   . Transthoracic echocardiogram 11/12/2010     Ejection fraction felt to be around 50%.  Wall thickness was increased in a pattern of mild LVH.  Moderately dilated left atrium.  Mildly dilated right atrium. Right ventricle was mildly dilated with mildly reduced systolic function  . Cardiac catheterization 06/09/2009    inferior wall hypokinesia with ejection fraction of 55%.  . Abdominal hysterectomy   . Joint replacement   . Tee without cardioversion 05/23/2011    Procedure: TRANSESOPHAGEAL ECHOCARDIOGRAM (TEE);  Surgeon: Lewayne Bunting, MD;  Location: [facility] ENDOSCOPY;  Service: Cardiovascular;  Laterality: N/A;    History  Smoking status  . Former Smoker  Smokeless tobacco  . Former Neurosurgeon  . Quit date: 07/04/1972    History  Alcohol Use No    Family History  Problem Relation Age of Onset  . Colon cancer Mother   . Stroke Mother   . Hypertension Mother   . Cancer Father   . Heart attack Father     x2    Review of Systems: The review of systems is positive for chronic edema. No awareness of her heart at this time.  All other systems were reviewed and are negative.  Physical Exam: BP 149/76  Pulse 61  Ht 5\' 3"  (1.6 m)  Wt 226 lb (102.513 kg)  BMI 40.03 kg/m2 Patient is very pleasant and in no acute distress. She is well dressed. Skin is warm and dry. Color is normal.  HEENT is unremarkable. Normocephalic/atraumatic. PERRL. Sclera are nonicteric. Neck is supple. No masses. No JVD. Lungs are clear. Cardiac exam shows a regular but fast rhythm today. Abdomen is obese but soft. Extremities reveal 2+ edema. Gait and ROM are intact. No gross neurologic deficits  noted.   LABORATORY DATA:    Assessment / Plan:

## 2011-08-19 NOTE — Assessment & Plan Note (Signed)
She does have increased edema on exam today and her weight is increased by 7 pounds. I reinforced the need for sodium restriction. We will increase her Lasix to 40 mg in the morning and 20 mg in the evening.

## 2011-08-19 NOTE — Patient Instructions (Addendum)
Increase Lasix to 40 mg in the morning and 20 mg in the evening .  Continue to restrict sodium intake.  Continue your other medications.  I will see you again in 3 months.

## 2011-08-19 NOTE — Assessment & Plan Note (Signed)
She has a history of atrial fibrillation and paroxysmal atrial tachycardia. She is not a candidate for antiarrhythmic drug therapy or ablation. Her arrhythmia seems to appear under pretty good control at this time on metoprolol.

## 2011-08-19 NOTE — Assessment & Plan Note (Signed)
She states she has been too frightened to exercise. I have explained that exercise is a very important part of her treatment. She needs to work on her conditioning. She had completed cardiac rehabilitation and will try to get more exercise at home.

## 2011-09-08 ENCOUNTER — Other Ambulatory Visit (HOSPITAL_BASED_OUTPATIENT_CLINIC_OR_DEPARTMENT_OTHER): Payer: Medicare Other | Admitting: Lab

## 2011-09-08 ENCOUNTER — Ambulatory Visit (HOSPITAL_BASED_OUTPATIENT_CLINIC_OR_DEPARTMENT_OTHER): Payer: Medicare Other

## 2011-09-08 VITALS — BP 143/70 | HR 72 | Temp 97.4°F

## 2011-09-08 DIAGNOSIS — D631 Anemia in chronic kidney disease: Secondary | ICD-10-CM

## 2011-09-08 DIAGNOSIS — N189 Chronic kidney disease, unspecified: Secondary | ICD-10-CM

## 2011-09-08 DIAGNOSIS — N039 Chronic nephritic syndrome with unspecified morphologic changes: Secondary | ICD-10-CM

## 2011-09-08 LAB — CBC WITH DIFFERENTIAL/PLATELET
Basophils Absolute: 0.1 10*3/uL (ref 0.0–0.1)
EOS%: 2.8 % (ref 0.0–7.0)
Eosinophils Absolute: 0.2 10*3/uL (ref 0.0–0.5)
HCT: 33.5 % — ABNORMAL LOW (ref 34.8–46.6)
HGB: 11 g/dL — ABNORMAL LOW (ref 11.6–15.9)
MONO#: 0.9 10*3/uL (ref 0.1–0.9)
NEUT#: 3.3 10*3/uL (ref 1.5–6.5)
NEUT%: 48.5 % (ref 38.4–76.8)
RDW: 17.6 % — ABNORMAL HIGH (ref 11.2–14.5)
lymph#: 2.3 10*3/uL (ref 0.9–3.3)

## 2011-09-08 MED ORDER — DARBEPOETIN ALFA-POLYSORBATE 500 MCG/ML IJ SOLN
200.0000 ug | Freq: Once | INTRAMUSCULAR | Status: DC
  Administered 2011-09-08: 200 ug via SUBCUTANEOUS

## 2011-10-03 ENCOUNTER — Other Ambulatory Visit: Payer: Self-pay

## 2011-10-03 MED ORDER — METOPROLOL TARTRATE 50 MG PO TABS: ORAL_TABLET | ORAL | Status: DC

## 2011-10-05 ENCOUNTER — Other Ambulatory Visit: Payer: Self-pay | Admitting: *Deleted

## 2011-10-06 ENCOUNTER — Ambulatory Visit (HOSPITAL_BASED_OUTPATIENT_CLINIC_OR_DEPARTMENT_OTHER): Payer: Medicare Other

## 2011-10-06 ENCOUNTER — Ambulatory Visit: Payer: Medicare Other

## 2011-10-06 ENCOUNTER — Other Ambulatory Visit: Payer: Self-pay | Admitting: Oncology

## 2011-10-06 ENCOUNTER — Other Ambulatory Visit: Payer: Medicare Other | Admitting: Lab

## 2011-10-06 ENCOUNTER — Other Ambulatory Visit (HOSPITAL_BASED_OUTPATIENT_CLINIC_OR_DEPARTMENT_OTHER): Payer: Medicare Other | Admitting: Lab

## 2011-10-06 VITALS — BP 106/62 | HR 68 | Temp 97.1°F

## 2011-10-06 DIAGNOSIS — N189 Chronic kidney disease, unspecified: Secondary | ICD-10-CM

## 2011-10-06 DIAGNOSIS — D631 Anemia in chronic kidney disease: Secondary | ICD-10-CM

## 2011-10-06 DIAGNOSIS — N039 Chronic nephritic syndrome with unspecified morphologic changes: Secondary | ICD-10-CM

## 2011-10-06 LAB — CBC WITH DIFFERENTIAL/PLATELET
Basophils Absolute: 0.1 10*3/uL (ref 0.0–0.1)
Eosinophils Absolute: 0.2 10*3/uL (ref 0.0–0.5)
HCT: 32.9 % — ABNORMAL LOW (ref 34.8–46.6)
HGB: 10.7 g/dL — ABNORMAL LOW (ref 11.6–15.9)
LYMPH%: 30.6 % (ref 14.0–49.7)
MONO#: 0.9 10*3/uL (ref 0.1–0.9)
NEUT#: 3.5 10*3/uL (ref 1.5–6.5)
Platelets: 235 10*3/uL (ref 145–400)
RBC: 3.64 10*6/uL — ABNORMAL LOW (ref 3.70–5.45)
WBC: 6.8 10*3/uL (ref 3.9–10.3)
nRBC: 0 % (ref 0–0)

## 2011-10-06 MED ORDER — DARBEPOETIN ALFA-POLYSORBATE 200 MCG/0.4ML IJ SOLN
200.0000 ug | Freq: Once | INTRAMUSCULAR | Status: AC
  Administered 2011-10-06: 200 ug via SUBCUTANEOUS
  Filled 2011-10-06: qty 0.4

## 2011-10-06 NOTE — Patient Instructions (Signed)
Pt in for injection without complaint.  Injection given pt discharged ambulatory with instructions to call for questions or concerns.

## 2011-10-22 ENCOUNTER — Inpatient Hospital Stay (HOSPITAL_COMMUNITY)
Admission: EM | Admit: 2011-10-22 | Discharge: 2011-10-25 | DRG: 378 | Disposition: A | Discharge status: Home / Self Care | Payer: Medicare Other | Attending: Internal Medicine | Admitting: Internal Medicine

## 2011-10-22 ENCOUNTER — Encounter (HOSPITAL_COMMUNITY): Payer: Self-pay | Admitting: *Deleted

## 2011-10-22 DIAGNOSIS — D62 Acute posthemorrhagic anemia: Secondary | ICD-10-CM | POA: Diagnosis present

## 2011-10-22 DIAGNOSIS — K648 Other hemorrhoids: Secondary | ICD-10-CM | POA: Diagnosis present

## 2011-10-22 DIAGNOSIS — I4891 Unspecified atrial fibrillation: Secondary | ICD-10-CM | POA: Diagnosis present

## 2011-10-22 DIAGNOSIS — I509 Heart failure, unspecified: Secondary | ICD-10-CM | POA: Diagnosis present

## 2011-10-22 DIAGNOSIS — R5383 Other fatigue: Secondary | ICD-10-CM | POA: Diagnosis present

## 2011-10-22 DIAGNOSIS — R609 Edema, unspecified: Secondary | ICD-10-CM | POA: Diagnosis present

## 2011-10-22 DIAGNOSIS — E11319 Type 2 diabetes mellitus with unspecified diabetic retinopathy without macular edema: Secondary | ICD-10-CM | POA: Diagnosis present

## 2011-10-22 DIAGNOSIS — I2510 Atherosclerotic heart disease of native coronary artery without angina pectoris: Secondary | ICD-10-CM | POA: Diagnosis present

## 2011-10-22 DIAGNOSIS — Z951 Presence of aortocoronary bypass graft: Secondary | ICD-10-CM

## 2011-10-22 DIAGNOSIS — R5381 Other malaise: Secondary | ICD-10-CM | POA: Diagnosis present

## 2011-10-22 DIAGNOSIS — E669 Obesity, unspecified: Secondary | ICD-10-CM | POA: Diagnosis present

## 2011-10-22 DIAGNOSIS — Z7901 Long term (current) use of anticoagulants: Secondary | ICD-10-CM

## 2011-10-22 DIAGNOSIS — N039 Chronic nephritic syndrome with unspecified morphologic changes: Secondary | ICD-10-CM | POA: Diagnosis present

## 2011-10-22 DIAGNOSIS — K644 Residual hemorrhoidal skin tags: Secondary | ICD-10-CM | POA: Diagnosis present

## 2011-10-22 DIAGNOSIS — E1139 Type 2 diabetes mellitus with other diabetic ophthalmic complication: Secondary | ICD-10-CM | POA: Diagnosis present

## 2011-10-22 DIAGNOSIS — K5731 Diverticulosis of large intestine without perforation or abscess with bleeding: Principal | ICD-10-CM | POA: Diagnosis present

## 2011-10-22 DIAGNOSIS — I129 Hypertensive chronic kidney disease with stage 1 through stage 4 chronic kidney disease, or unspecified chronic kidney disease: Secondary | ICD-10-CM | POA: Diagnosis present

## 2011-10-22 DIAGNOSIS — E039 Hypothyroidism, unspecified: Secondary | ICD-10-CM | POA: Diagnosis present

## 2011-10-22 DIAGNOSIS — D472 Monoclonal gammopathy: Secondary | ICD-10-CM | POA: Diagnosis present

## 2011-10-22 DIAGNOSIS — K922 Gastrointestinal hemorrhage, unspecified: Secondary | ICD-10-CM

## 2011-10-22 DIAGNOSIS — I519 Heart disease, unspecified: Secondary | ICD-10-CM | POA: Diagnosis present

## 2011-10-22 DIAGNOSIS — E1149 Type 2 diabetes mellitus with other diabetic neurological complication: Secondary | ICD-10-CM | POA: Diagnosis present

## 2011-10-22 DIAGNOSIS — F0390 Unspecified dementia without behavioral disturbance: Secondary | ICD-10-CM | POA: Diagnosis present

## 2011-10-22 DIAGNOSIS — E1142 Type 2 diabetes mellitus with diabetic polyneuropathy: Secondary | ICD-10-CM | POA: Diagnosis present

## 2011-10-22 DIAGNOSIS — N183 Chronic kidney disease, stage 3 unspecified: Secondary | ICD-10-CM | POA: Diagnosis present

## 2011-10-22 DIAGNOSIS — K449 Diaphragmatic hernia without obstruction or gangrene: Secondary | ICD-10-CM | POA: Diagnosis present

## 2011-10-22 DIAGNOSIS — D631 Anemia in chronic kidney disease: Secondary | ICD-10-CM | POA: Diagnosis present

## 2011-10-22 DIAGNOSIS — E785 Hyperlipidemia, unspecified: Secondary | ICD-10-CM | POA: Diagnosis present

## 2011-10-22 LAB — OCCULT BLOOD, POC DEVICE: Fecal Occult Bld: POSITIVE

## 2011-10-22 LAB — CBC
Hemoglobin: 9.2 g/dL — ABNORMAL LOW (ref 12.0–15.0)
MCH: 29.6 pg (ref 26.0–34.0)
MCHC: 31.9 g/dL (ref 30.0–36.0)
RDW: 17.2 % — ABNORMAL HIGH (ref 11.5–15.5)

## 2011-10-22 LAB — COMPREHENSIVE METABOLIC PANEL
ALT: 17 U/L (ref 0–35)
Albumin: 3.2 g/dL — ABNORMAL LOW (ref 3.5–5.2)
BUN: 27 mg/dL — ABNORMAL HIGH (ref 6–23)
Calcium: 9 mg/dL (ref 8.4–10.5)
GFR calc Af Amer: 39 mL/min — ABNORMAL LOW (ref >90)
Glucose, Bld: 346 mg/dL — ABNORMAL HIGH (ref 70–99)
Sodium: 134 mEq/L — ABNORMAL LOW (ref 135–145)
Total Protein: 7.8 g/dL (ref 6.0–8.3)

## 2011-10-22 LAB — PROTIME-INR
INR: 2.12 — ABNORMAL HIGH (ref 0.00–1.49)
Prothrombin Time: 24.1 seconds — ABNORMAL HIGH (ref 11.6–15.2)

## 2011-10-22 NOTE — ED Notes (Signed)
Pt c/o increasing fatigue, low back pain starting yesterday and bleeding from rectum w/ BM's starting today x 3 episodes, dark maroon blood. Pt has hx of hemorrhoids. Pt continues to c/o low back pain that is not chronic in nature.

## 2011-10-22 NOTE — ED Notes (Signed)
Pt reports seeing red in stool x 3 days. Pt states last 3 BM's large amount of ?blood, describes as "light mahogany" in color. Pt denies n/v. Pt denies pain. Pt states she came in tonight after feeling "weak" all day,

## 2011-10-22 NOTE — ED Notes (Signed)
Pt stated she was "slightly dizzy" upon standing while performing orthostatic vitals

## 2011-10-22 NOTE — ED Provider Notes (Signed)
History     CSN: [id number]  Arrival date & time 10/22/11  2014   First MD Initiated Contact with Patient 10/22/11 2208      Chief Complaint  Patient presents with  . Rectal Bleeding    x 3 BM's today    (Consider location/radiation/quality/duration/timing/severity/associated sxs/prior treatment) Patient is a 78 y.o. female presenting with hematochezia. The history is provided by the patient.  Rectal Bleeding  The current episode started more than 2 weeks ago. The problem has been unchanged. The patient is experiencing no pain. The stool is described as soft and bloody. Pertinent negatives include no fever, no abdominal pain, no nausea, no vomiting and no chest pain. Associated symptoms comments: She initially started seeing "mahogany" stool 2 weeks ago and attributed it to her diet. She is now lightheaded, fatigued and continues to see dark maroon stool. No pain. No history of GI bleed in the past..    Past Medical History  Diagnosis Date  . Coronary artery disease     Severe two vessel; s/p CABG  . Hypertension   . Diabetes mellitus     Type 2  . Hyperlipidemia   . Diabetic neuropathy   . PAF (paroxysmal atrial fibrillation)     failed medical therapy with amiodarone; not a candidate for class IC meds due to her CAD. No Multaq due to CHF hx; QT prolonged with amiodarone; Not able to be ablated due to valve disease  . CVA (cerebral vascular accident) 2004  . Hypothyroidism   . Chronic anemia   . Diastolic dysfunction     per echo in May of 2012  . GERD (gastroesophageal reflux disease)   . Arthritis   . Valvular heart disease     has moderate mitral insufficiency with severe tricuspid insufficiency per TEE in November 2012    Past Surgical History  Procedure Date  . Coronary artery bypass graft 06/2009    X3, LIMA to LAD, SVG to diagonal, SVG to the posterior descending artery.  . Tonsillectomy   . Knee arthroscopy   . Transthoracic echocardiogram 11/12/2010   Ejection fraction felt to be around 50%.  Wall thickness was increased in a pattern of mild LVH.  Moderately dilated left atrium.  Mildly dilated right atrium. Right ventricle was mildly dilated with mildly reduced systolic function  . Cardiac catheterization 06/09/2009    inferior wall hypokinesia with ejection fraction of 55%.  . Abdominal hysterectomy   . Joint replacement   . Tee without cardioversion 05/23/2011    Procedure: TRANSESOPHAGEAL ECHOCARDIOGRAM (TEE);  Surgeon: Lewayne Bunting, MD;  Location: [facility] ENDOSCOPY;  Service: Cardiovascular;  Laterality: N/A;    Family History  Problem Relation Age of Onset  . Colon cancer Mother   . Stroke Mother   . Hypertension Mother   . Cancer Father   . Heart attack Father     x2    History  Substance Use Topics  . Smoking status: Former Games developer  . Smokeless tobacco: Former Neurosurgeon    Quit date: 07/04/1972  . Alcohol Use: No    OB History    Grav Para Term Preterm Abortions TAB SAB Ect Mult Living                  Review of Systems  Constitutional: Positive for fatigue. Negative for fever.  Respiratory: Negative.  Negative for shortness of breath.   Cardiovascular: Negative.  Negative for chest pain.  Gastrointestinal: Positive for blood in stool and hematochezia. Negative  for nausea, vomiting and abdominal pain.  Musculoskeletal: Positive for back pain.       She c/o chronic pain in lower back.  Neurological: Positive for weakness and light-headedness.    Allergies  Betadine; Capoten; Darvocet; and Penicillins  Home Medications   Current Outpatient Rx  Name Route Sig Dispense Refill  . AMITRIPTYLINE HCL 25 MG PO TABS Oral Take 75 mg by mouth at bedtime.      . ASPIRIN 81 MG PO TABS Oral Take 81 mg by mouth daily.      . ATORVASTATIN CALCIUM 40 MG PO TABS Oral Take 40 mg by mouth every evening.     Marland Kitchen VITAMIN D 2000 UNITS PO TABS Oral Take 2,000 Units by mouth daily.      Marland Kitchen CLOBETASOL PROPIONATE 0.05 % EX CREA Vaginal  Place 1 application vaginally as needed. itching    . VITAMIN B 12 PO Oral Take 1 tablet by mouth daily.      Marland Kitchen DARBEPOETIN ALFA-POLYSORBATE 300 MCG/0.6ML IJ SOLN Subcutaneous Inject 300 mcg into the skin every 30 (thirty) days.      . DONEPEZIL HCL 5 MG PO TABS Oral Take 5 mg by mouth at bedtime.    Marland Kitchen ESOMEPRAZOLE MAGNESIUM 40 MG PO CPDR Oral Take 40 mg by mouth daily before breakfast.      . OMEGA-3 FATTY ACIDS 1000 MG PO CAPS Oral Take 1 g by mouth daily.     . FUROSEMIDE 20 MG PO TABS Oral Take 1 tablet (20 mg total) by mouth 2 (two) times daily. 1 tablet in the AM and 1 tablet in the PM 90 tablet 11  . GLIMEPIRIDE 4 MG PO TABS Oral Take 4 mg by mouth 2 (two) times daily.     . INSULIN GLARGINE 100 UNIT/ML Hardy SOLN Subcutaneous Inject 60 Units into the skin at bedtime.     . INSULIN LISPRO (HUMAN) 100 UNIT/ML Palmer SOLN Subcutaneous Inject 28 Units into the skin 3 (three) times daily before meals.     . IRON PO Oral Take 1 tablet by mouth daily.     Marland Kitchen LEVOTHYROXINE SODIUM 100 MCG PO TABS Oral Take 100 mcg by mouth daily.    Marland Kitchen MAGNESIUM PO Oral Take 1 tablet by mouth daily.     Marland Kitchen METOPROLOL TARTRATE 50 MG PO TABS Oral Take 50-75 mg by mouth See admin instructions. Take 1 1/2 tablets in the am and 1 tablet at night.    . WARFARIN SODIUM 5 MG PO TABS Oral Take 5-10 mg by mouth See admin instructions. 10mg  on Monday and Fridays      BP 124/42  Pulse 71  Temp(Src) 97.8 F (36.6 C) (Oral)  Resp 20  SpO2 97%  Physical Exam  Constitutional: She appears well-developed and well-nourished. No distress.  HENT:  Head: Atraumatic.  Eyes:       Conjunctival pallor.  Neck: Neck supple.  Cardiovascular: Normal rate.   No murmur heard. Pulmonary/Chest: Effort normal and breath sounds normal. She has no rales.  Abdominal: Soft. She exhibits no distension. There is no tenderness. There is no rebound and no guarding.  Genitourinary: Guaiac positive stool.       Stool in rectum is maroon in color. No  bright red blood. No hemorrhoids.  Skin: Skin is warm and dry.    ED Course  Procedures (including critical care time)  Labs Reviewed  PROTIME-INR - Abnormal; Notable for the following:    Prothrombin Time 24.1 (*)  INR 2.12 (*)    All other components within normal limits  CBC - Abnormal; Notable for the following:    RBC 3.11 (*)    Hemoglobin 9.2 (*)    HCT 28.8 (*)    RDW 17.2 (*)    All other components within normal limits  COMPREHENSIVE METABOLIC PANEL - Abnormal; Notable for the following:    Sodium 134 (*)    Glucose, Bld 346 (*)    BUN 27 (*)    Creatinine, Ser 1.47 (*)    Albumin 3.2 (*)    GFR calc non Af Amer 33 (*)    GFR calc Af Amer 39 (*)    All other components within normal limits  TYPE AND SCREEN  ABO/RH  OCCULT BLOOD, POC DEVICE   No results found.   No diagnosis found. 1. GI Bleed 2. Coagulopathy    MDM  The patient is having maroon stool, guaiac positive, on coumadin with INR 2.12. She is presently stable and comfortable. Will call Dr. Timothy Lasso for admission.        Rodena Medin, PA-C 10/23/11 [phone]

## 2011-10-23 ENCOUNTER — Encounter (HOSPITAL_COMMUNITY): Payer: Self-pay | Admitting: *Deleted

## 2011-10-23 DIAGNOSIS — K922 Gastrointestinal hemorrhage, unspecified: Secondary | ICD-10-CM | POA: Diagnosis present

## 2011-10-23 LAB — GLUCOSE, CAPILLARY
Glucose-Capillary: 194 mg/dL — ABNORMAL HIGH (ref 70–99)
Glucose-Capillary: 272 mg/dL — ABNORMAL HIGH (ref 70–99)

## 2011-10-23 LAB — CBC
HCT: 28.8 % — ABNORMAL LOW (ref 36.0–46.0)
Hemoglobin: 9.3 g/dL — ABNORMAL LOW (ref 12.0–15.0)
RBC: 3.12 MIL/uL — ABNORMAL LOW (ref 3.87–5.11)

## 2011-10-23 LAB — PREPARE RBC (CROSSMATCH)

## 2011-10-23 LAB — HEMOGLOBIN A1C: Hgb A1c MFr Bld: 10.6 % — ABNORMAL HIGH (ref <5.7)

## 2011-10-23 MED ORDER — LEVOTHYROXINE SODIUM 100 MCG PO TABS
100.0000 ug | ORAL_TABLET | Freq: Every day | ORAL | Status: DC
  Administered 2011-10-23 – 2011-10-25 (×3): 100 ug via ORAL
  Filled 2011-10-23 (×5): qty 1

## 2011-10-23 MED ORDER — SODIUM CHLORIDE 0.9 % IJ SOLN
3.0000 mL | Freq: Two times a day (BID) | INTRAMUSCULAR | Status: DC
  Administered 2011-10-23 – 2011-10-25 (×4): 3 mL via INTRAVENOUS

## 2011-10-23 MED ORDER — DARBEPOETIN ALFA-POLYSORBATE 300 MCG/0.6ML IJ SOLN: 300.0000 ug | INTRAMUSCULAR | Status: DC

## 2011-10-23 MED ORDER — ACETAMINOPHEN 650 MG RE SUPP: 650.0000 mg | Freq: Four times a day (QID) | RECTAL | Status: DC | PRN

## 2011-10-23 MED ORDER — METOPROLOL TARTRATE 25 MG PO TABS
25.0000 mg | ORAL_TABLET | Freq: Two times a day (BID) | ORAL | Status: DC
  Administered 2011-10-24 – 2011-10-25 (×3): 25 mg via ORAL
  Filled 2011-10-23 (×7): qty 1

## 2011-10-23 MED ORDER — FUROSEMIDE 20 MG PO TABS
20.0000 mg | ORAL_TABLET | Freq: Every day | ORAL | Status: DC
  Administered 2011-10-23 – 2011-10-24 (×2): 20 mg via ORAL
  Filled 2011-10-23 (×3): qty 1

## 2011-10-23 MED ORDER — VITAMIN B-12 1000 MCG PO TABS
1000.0000 ug | ORAL_TABLET | Freq: Every day | ORAL | Status: DC
  Administered 2011-10-23 – 2011-10-25 (×3): 1000 ug via ORAL
  Filled 2011-10-23 (×4): qty 1

## 2011-10-23 MED ORDER — AMITRIPTYLINE HCL 75 MG PO TABS
75.0000 mg | ORAL_TABLET | Freq: Every day | ORAL | Status: DC
  Administered 2011-10-23 – 2011-10-24 (×2): 75 mg via ORAL
  Filled 2011-10-23 (×4): qty 1

## 2011-10-23 MED ORDER — SODIUM CHLORIDE 0.9 % IJ SOLN
3.0000 mL | Freq: Two times a day (BID) | INTRAMUSCULAR | Status: DC
  Administered 2011-10-24: 3 mL via INTRAVENOUS

## 2011-10-23 MED ORDER — METOPROLOL TARTRATE 50 MG PO TABS
50.0000 mg | ORAL_TABLET | Freq: Every day | ORAL | Status: DC
  Filled 2011-10-23: qty 1

## 2011-10-23 MED ORDER — INSULIN ASPART 100 UNIT/ML ~~LOC~~ SOLN
0.0000 [IU] | Freq: Three times a day (TID) | SUBCUTANEOUS | Status: DC
  Administered 2011-10-23 (×2): 5 [IU] via SUBCUTANEOUS
  Administered 2011-10-24: 1 [IU] via SUBCUTANEOUS
  Administered 2011-10-24: 3 [IU] via SUBCUTANEOUS
  Administered 2011-10-25 (×2): 5 [IU] via SUBCUTANEOUS

## 2011-10-23 MED ORDER — ACETAMINOPHEN 325 MG PO TABS
650.0000 mg | ORAL_TABLET | Freq: Once | ORAL | Status: AC
  Administered 2011-10-23: 650 mg via ORAL
  Filled 2011-10-23: qty 2

## 2011-10-23 MED ORDER — VITAMIN D 1000 UNITS PO TABS
2000.0000 [IU] | ORAL_TABLET | Freq: Every day | ORAL | Status: DC
  Administered 2011-10-23 – 2011-10-25 (×3): 2000 [IU] via ORAL
  Filled 2011-10-23 (×4): qty 2

## 2011-10-23 MED ORDER — DONEPEZIL HCL 5 MG PO TABS
5.0000 mg | ORAL_TABLET | Freq: Every day | ORAL | Status: DC
  Administered 2011-10-23 – 2011-10-24 (×2): 5 mg via ORAL
  Filled 2011-10-23 (×4): qty 1

## 2011-10-23 MED ORDER — ACETAMINOPHEN 325 MG PO TABS: 650.0000 mg | ORAL_TABLET | Freq: Four times a day (QID) | ORAL | Status: DC | PRN

## 2011-10-23 MED ORDER — METOPROLOL TARTRATE 50 MG PO TABS
75.0000 mg | ORAL_TABLET | Freq: Every day | ORAL | Status: DC
  Administered 2011-10-23: 75 mg via ORAL
  Filled 2011-10-23: qty 1

## 2011-10-23 MED ORDER — CLOBETASOL PROPIONATE 0.05 % EX CREA
1.0000 "application " | TOPICAL_CREAM | CUTANEOUS | Status: DC | PRN
  Filled 2011-10-23: qty 15

## 2011-10-23 MED ORDER — DARBEPOETIN ALFA-POLYSORBATE 200 MCG/0.4ML IJ SOLN: 200.0000 ug | INTRAMUSCULAR | Status: DC

## 2011-10-23 MED ORDER — FERROUS SULFATE 325 (65 FE) MG PO TABS
325.0000 mg | ORAL_TABLET | Freq: Every day | ORAL | Status: DC
  Administered 2011-10-23 – 2011-10-25 (×3): 325 mg via ORAL
  Filled 2011-10-23 (×4): qty 1

## 2011-10-23 MED ORDER — ATORVASTATIN CALCIUM 40 MG PO TABS
40.0000 mg | ORAL_TABLET | Freq: Every day | ORAL | Status: DC
  Administered 2011-10-23 – 2011-10-24 (×2): 40 mg via ORAL
  Filled 2011-10-23 (×4): qty 1

## 2011-10-23 MED ORDER — PEG 3350-KCL-NA BICARB-NACL 420 G PO SOLR
4000.0000 mL | Freq: Once | ORAL | Status: AC
  Administered 2011-10-23: 4000 mL via ORAL

## 2011-10-23 MED ORDER — INSULIN ASPART 100 UNIT/ML ~~LOC~~ SOLN
0.0000 [IU] | Freq: Every day | SUBCUTANEOUS | Status: DC
  Administered 2011-10-23: 2 [IU] via SUBCUTANEOUS
  Administered 2011-10-24: 4 [IU] via SUBCUTANEOUS

## 2011-10-23 MED ORDER — INSULIN GLARGINE 100 UNIT/ML ~~LOC~~ SOLN
25.0000 [IU] | Freq: Every day | SUBCUTANEOUS | Status: DC
  Administered 2011-10-23 – 2011-10-24 (×2): 25 [IU] via SUBCUTANEOUS

## 2011-10-23 MED ORDER — PANTOPRAZOLE SODIUM 40 MG PO TBEC
40.0000 mg | DELAYED_RELEASE_TABLET | Freq: Every day | ORAL | Status: DC
  Administered 2011-10-23 – 2011-10-25 (×3): 40 mg via ORAL
  Filled 2011-10-23 (×4): qty 1

## 2011-10-23 MED ORDER — VITAMIN B 12 100 MCG PO LOZG: 1000.0000 ug | LOZENGE | Freq: Every day | ORAL | Status: DC

## 2011-10-23 MED ORDER — METOPROLOL TARTRATE 50 MG PO TABS: 50.0000 mg | ORAL_TABLET | ORAL | Status: DC

## 2011-10-23 MED ORDER — ASPIRIN 81 MG PO CHEW
81.0000 mg | CHEWABLE_TABLET | Freq: Every day | ORAL | Status: DC
  Administered 2011-10-23 – 2011-10-25 (×3): 81 mg via ORAL
  Filled 2011-10-23 (×4): qty 1

## 2011-10-23 MED ORDER — ACETAMINOPHEN 325 MG PO TABS: 650.0000 mg | ORAL_TABLET | Freq: Once | ORAL | Status: DC

## 2011-10-23 NOTE — H&P (Addendum)
[name] is an 78 y.o. female.   Chief Complaint: bloody stool HPI: [name] is a pleasant female with complex medical history including CKD on chronic aranesp shots and anticoagulation for a-fib. She had eval in past for heme positive stool with dr. Kinnie Scales including colonoscopy 2009 and 5 yrs before that she had upper endoscopy, colonoscopy and capsule endoscopy for anemia but it is not clear that any source was ever found. She has noted a week or so of darker stool, some maroon and then yesterday had three maroon stools. She presented to  The ER and hgb 9.2 had she had grossly bloody stool.  Baseline hgb is 11.6 from 1/13.  She is admitted for further eval.  She has felt weak and washed out the last few days but denies fever or abd pain or cp.  Past Medical History  Diagnosis Date  . Coronary artery disease     Severe two vessel; s/p CABG 12/10, ef 50%  . Hypertension   . Diabetes mellitus     Type 2 about 1993 diagnosed  . Hyperlipidemia   . Diabetic neuropathy, diabetic retinopathy   . PAF (paroxysmal atrial fibrillation)     failed medical therapy with amiodarone; not a candidate for class IC meds due to her CAD. No Multaq due to CHF hx; QT prolonged with amiodarone; Not able to be ablated due to valve disease  . CVA (cerebral vascular accident) 2004  . Hypothyroidism   . Chronic anemia- on aranesp her hematology ANA positive of unclear sig. MGUS Ventral hernia Diverticulosis with one past episode of diverticulitis LE edema, venous stasis Insomnia Djd/dddz spine obesity   . Diastolic dysfunction     per echo in May of 2012  . GERD (gastroesophageal reflux disease)   . Arthritis   . Valvular heart disease     has moderate mitral insufficiency with severe tricuspid insufficiency per TEE in November 2012    Past Surgical History  Procedure Date  . Coronary artery bypass graft 06/2009    X3, LIMA to LAD, SVG to diagonal, SVG to the posterior descending artery.  .  Tonsillectomy   . Knee arthroscopy   . Transthoracic echocardiogram 11/12/2010     Ejection fraction felt to be around 50%.  Wall thickness was increased in a pattern of mild LVH.  Moderately dilated left atrium.  Mildly dilated right atrium. Right ventricle was mildly dilated with mildly reduced systolic function  . Cardiac catheterization 06/09/2009    inferior wall hypokinesia with ejection fraction of 55%.  . Abdominal hysterectomy   . Joint replacement   . Tee without cardioversion 05/23/2011    Procedure: TRANSESOPHAGEAL ECHOCARDIOGRAM (TEE);  Surgeon: Lewayne Bunting, MD;  Location: [facility] ENDOSCOPY;  Service: Cardiovascular;  Laterality: N/A; TAH 1979, fallopian tumor surgery 4/02 Neg. Breast biopsy    Family History  Problem Relation Age of Onset  . Colon cancer Mother   . Stroke Mother   . Hypertension Mother   . Cancer Father   . Heart attack Father     x2  Dad died age 33. 2 children and 2 grandchildren  Social History:  reports that she has quit smoking. She quit smokeless tobacco use about 39 years ago. She reports that she does not drink alcohol or use illicit drugs. Retired Runner, broadcasting/film/video, Chartered loss adjuster. [employer]. Her identical twin sister is alive and taught me at [employer] elementary.  Allergies:  Allergies  Allergen Reactions  . Betadine (Povidone Iodine)  Itching  . Capoten (Captopril) Other (See Comments)    unknown  . Darvocet (Propoxacet-N) Other (See Comments)    unknown  . Penicillins Swelling    Statins- tussionex Ace-inhibitor  Medications Prior to Admission  Medication Dose Route Frequency Provider Last Rate Last Dose  . acetaminophen (TYLENOL) tablet 650 mg  650 mg Oral Once Ezequiel Kayser, MD   650 mg at 10/23/11 0254  . DISCONTD: acetaminophen (TYLENOL) tablet 650 mg  650 mg Oral Once Ezequiel Kayser, MD       Medications Prior to Admission  Medication Sig Dispense Refill  . amitriptyline (ELAVIL) 25 MG tablet Take 75 mg by mouth at  bedtime.        Marland Kitchen aspirin 81 MG tablet Take 81 mg by mouth daily.        Marland Kitchen atorvastatin (LIPITOR) 40 MG tablet Take 40 mg by mouth every evening.       . Cholecalciferol (VITAMIN D) 2000 UNITS tablet Take 2,000 Units by mouth daily.        . clobetasol cream (TEMOVATE) 0.05 % Place 1 application vaginally as needed. itching      . Cyanocobalamin (VITAMIN B 12 PO) Take 1 tablet by mouth daily.        . darbepoetin (ARANESP) 300 MCG/0.6ML SOLN Inject 300 mcg into the skin every 30 (thirty) days.        Marland Kitchen donepezil (ARICEPT) 5 MG tablet Take 5 mg by mouth at bedtime.      Marland Kitchen esomeprazole (NEXIUM) 40 MG capsule Take 40 mg by mouth daily before breakfast.        . fish oil-omega-3 fatty acids 1000 MG capsule Take 1 g by mouth daily.       . furosemide (LASIX) 20 MG tablet Take 1 tablet (20 mg total) by mouth 2 (two) times daily. 1 tablet in the AM and 1 tablet in the PM  90 tablet  11  . glimepiride (AMARYL) 4 MG tablet Take 4 mg by mouth 2 (two) times daily.       . insulin glargine (LANTUS) 100 UNIT/ML injection Inject 60 Units into the skin at bedtime.       . insulin lispro (HUMALOG) 100 UNIT/ML injection Inject 28 Units into the skin 3 (three) times daily before meals.       . IRON PO Take 1 tablet by mouth daily.       Marland Kitchen levothyroxine (SYNTHROID, LEVOTHROID) 100 MCG tablet Take 100 mcg by mouth daily.      Marland Kitchen MAGNESIUM PO Take 1 tablet by mouth daily.       . metoprolol (LOPRESSOR) 50 MG tablet Take 50-75 mg by mouth See admin instructions. Take 1 1/2 tablets in the am and 1 tablet at night.      . warfarin (COUMADIN) 5 MG tablet Take 5-10 mg by mouth See admin instructions. 10mg  on Monday and Fridays        Results for orders placed during the hospital encounter of 10/22/11 (from the past 48 hour(s))  PROTIME-INR     Status: Abnormal   Collection Time   10/22/11 10:34 PM      Component Value Range Comment   Prothrombin Time 24.1 (*) 11.6 - 15.2 (seconds)    INR 2.12 (*) 0.00 - 1.49    CBC      Status: Abnormal   Collection Time   10/22/11 10:34 PM      Component Value Range Comment   WBC  7.5  4.0 - 10.5 (K/uL)    RBC 3.11 (*) 3.87 - 5.11 (MIL/uL)    Hemoglobin 9.2 (*) 12.0 - 15.0 (g/dL)    HCT 82.9 (*) 56.2 - 46.0 (%)    MCV 92.6  78.0 - 100.0 (fL)    MCH 29.6  26.0 - 34.0 (pg)    MCHC 31.9  30.0 - 36.0 (g/dL)    RDW 13.0 (*) 86.5 - 15.5 (%)    Platelets 161  150 - 400 (K/uL)   COMPREHENSIVE METABOLIC PANEL     Status: Abnormal   Collection Time   10/22/11 10:34 PM      Component Value Range Comment   Sodium 134 (*) 135 - 145 (mEq/L)    Potassium 4.1  3.5 - 5.1 (mEq/L)    Chloride 98  96 - 112 (mEq/L)    CO2 28  19 - 32 (mEq/L)    Glucose, Bld 346 (*) 70 - 99 (mg/dL)    BUN 27 (*) 6 - 23 (mg/dL)    Creatinine, Ser 7.84 (*) 0.50 - 1.10 (mg/dL)    Calcium 9.0  8.4 - 10.5 (mg/dL)    Total Protein 7.8  6.0 - 8.3 (g/dL)    Albumin 3.2 (*) 3.5 - 5.2 (g/dL)    AST 26  0 - 37 (U/L)    ALT 17  0 - 35 (U/L)    Alkaline Phosphatase 104  39 - 117 (U/L)    Total Bilirubin 0.3  0.3 - 1.2 (mg/dL)    GFR calc non Af Amer 33 (*) >90 (mL/min)    GFR calc Af Amer 39 (*) >90 (mL/min)   TYPE AND SCREEN     Status: Normal (Preliminary result)   Collection Time   10/22/11 10:34 PM      Component Value Range Comment   ABO/RH(D) A POS      Antibody Screen NEG      Sample Expiration 10/25/2011      Unit Number [id number]      Blood Component Type RED CELLS,LR      Unit division 00      Status of Unit ISSUED      Transfusion Status OK TO TRANSFUSE      Crossmatch Result Compatible     ABO/RH     Status: Normal   Collection Time   10/22/11 10:34 PM      Component Value Range Comment   ABO/RH(D) A POS     OCCULT BLOOD, POC DEVICE     Status: Normal   Collection Time   10/22/11 11:36 PM      Component Value Range Comment   Fecal Occult Bld POSITIVE     PREPARE RBC (CROSSMATCH)     Status: Normal   Collection Time   10/23/11  1:30 AM      Component Value Range Comment   Order  Confirmation ORDER PROCESSED BY BLOOD BANK     GLUCOSE, CAPILLARY     Status: Abnormal   Collection Time   10/23/11  2:37 AM      Component Value Range Comment   Glucose-Capillary 251 (*) 70 - 99 (mg/dL)    No results found.  ROS:as per hpi  Blood pressure 131/70, pulse 60, temperature 97.8 F (36.6 C), temperature source Oral, resp. rate 20, height 5\' 3"  (1.6 m), weight 97 kg (213 lb 13.5 oz), SpO2 97.00%.  age approp. alert and oriented times 4, nad,  no pallor really. cta bilat. no w/r/r on  lung exam.  Heart  rrr no m/r/g. abd benign, no mass, no hsm , nontender, nondistended.  Nabs.  No edema.  Assessment/Plan  Patient Active Problem List  Diagnoses     .  Subacute GI bleeding. Suspect lower source. Stop coumadin.   PAS hose for dvt proph. Monitor h and h. One unit of blood given.  GI consult.  She is due for next aranesp dose week. Atrial fibrillation-sounds nsr today. ecg shows nsr with rbbb.  cxray suggest mild chf but clinically she is compensated.  . Hypothyroidism-check tsh.  . Chronic anticoagulation-interrupt this for now.  Marland Kitchen CAD (coronary artery disease)-stable.  . Diastolic dysfunction-stable.  . Anemia associated with chronic renal failure-follow  . Atrial tachycardia-none now.  . Right heart failure-stable.  Marland Kitchen MGUS (monoclonal gammopathy of unknown significance)-follow  . Unspecified deficiency anemia-see above. Full code status     Ezequiel Kayser, MD 10/23/2011, 7:41 AM

## 2011-10-23 NOTE — Consult Note (Signed)
Reason for Consult: Hematochezia Referring Physician: Rodrigo Ran, M.D.  [name] HPI: This is a 78 year old female with a PMH of anemia and heme positive stool.  In the past she was evaluated by Dr. Kinnie Scales for her anemia and she thinks that Dr. Kinnie Scales identified a source of bleeding in the colon, but she is not sure.  Her last evaluation was in 2009.  Her bleeding started several days ago, but she thought it was as a result of the beets that she ate.  However, yesterday, it was very evident to her that she was having hematochezia.  No complaints of chest pain or SOB.  As a result of her symptoms a GI consultation was requested.  Past Medical History  Diagnosis Date  . Coronary artery disease     Severe two vessel; s/p CABG  . Hypertension   . Diabetes mellitus     Type 2  . Hyperlipidemia   . Diabetic neuropathy   . PAF (paroxysmal atrial fibrillation)     failed medical therapy with amiodarone; not a candidate for class IC meds due to her CAD. No Multaq due to CHF hx; QT prolonged with amiodarone; Not able to be ablated due to valve disease  . CVA (cerebral vascular accident) 2004  . Hypothyroidism   . Chronic anemia   . Diastolic dysfunction     per echo in May of 2012  . GERD (gastroesophageal reflux disease)   . Arthritis   . Valvular heart disease     has moderate mitral insufficiency with severe tricuspid insufficiency per TEE in November 2012    Past Surgical History  Procedure Date  . Coronary artery bypass graft 06/2009    X3, LIMA to LAD, SVG to diagonal, SVG to the posterior descending artery.  . Tonsillectomy   . Knee arthroscopy   . Transthoracic echocardiogram 11/12/2010     Ejection fraction felt to be around 50%.  Wall thickness was increased in a pattern of mild LVH.  Moderately dilated left atrium.  Mildly dilated right atrium. Right ventricle was mildly dilated with mildly reduced systolic function  . Cardiac catheterization 06/09/2009    inferior  wall hypokinesia with ejection fraction of 55%.  . Abdominal hysterectomy   . Joint replacement   . Tee without cardioversion 05/23/2011    Procedure: TRANSESOPHAGEAL ECHOCARDIOGRAM (TEE);  Surgeon: Lewayne Bunting, MD;  Location: [facility] ENDOSCOPY;  Service: Cardiovascular;  Laterality: N/A;    Family History  Problem Relation Age of Onset  . Colon cancer Mother   . Stroke Mother   . Hypertension Mother   . Cancer Father   . Heart attack Father     x2    Social History:  reports that she has quit smoking. She quit smokeless tobacco use about 39 years ago. She reports that she does not drink alcohol or use illicit drugs.  Allergies:  Allergies  Allergen Reactions  . Betadine (Povidone Iodine) Itching  . Capoten (Captopril) Other (See Comments)    unknown  . Darvocet (Propoxacet-N) Other (See Comments)    unknown  . Penicillins Swelling    Medications:  Scheduled:   . acetaminophen  650 mg Oral Once  . amitriptyline  75 mg Oral QHS  . aspirin  81 mg Oral Daily  . atorvastatin  40 mg Oral q1800  . cholecalciferol  2,000 Units Oral Daily  . darbepoetin  300 mcg Subcutaneous Q30 days  . donepezil  5 mg Oral QHS  .  ferrous sulfate  325 mg Oral Daily  . furosemide  20 mg Oral Daily  . insulin aspart  0-5 Units Subcutaneous QHS  . insulin aspart  0-9 Units Subcutaneous TID WC  . insulin glargine  25 Units Subcutaneous QHS  . levothyroxine  100 mcg Oral QAC breakfast  . metoprolol tartrate  50 mg Oral QHS  . metoprolol tartrate  75 mg Oral Daily  . pantoprazole  40 mg Oral Q1200  . polyethylene glycol-electrolytes  4,000 mL Oral Once  . sodium chloride  3 mL Intravenous Q12H  . sodium chloride  3 mL Intravenous Q12H  . vitamin B-12  1,000 mcg Oral Daily  . DISCONTD: acetaminophen  650 mg Oral Once  . DISCONTD: metoprolol  50-75 mg Oral See admin instructions  . DISCONTD: Vitamin B 12  1,000 mcg Oral Daily   Continuous:   Results for orders placed during the hospital  encounter of 10/22/11 (from the past 24 hour(s))  PROTIME-INR     Status: Abnormal   Collection Time   10/22/11 10:34 PM      Component Value Range   Prothrombin Time 24.1 (*) 11.6 - 15.2 (seconds)   INR 2.12 (*) 0.00 - 1.49   CBC     Status: Abnormal   Collection Time   10/22/11 10:34 PM      Component Value Range   WBC 7.5  4.0 - 10.5 (K/uL)   RBC 3.11 (*) 3.87 - 5.11 (MIL/uL)   Hemoglobin 9.2 (*) 12.0 - 15.0 (g/dL)   HCT 16.1 (*) 09.6 - 46.0 (%)   MCV 92.6  78.0 - 100.0 (fL)   MCH 29.6  26.0 - 34.0 (pg)   MCHC 31.9  30.0 - 36.0 (g/dL)   RDW 04.5 (*) 40.9 - 15.5 (%)   Platelets 161  150 - 400 (K/uL)  COMPREHENSIVE METABOLIC PANEL     Status: Abnormal   Collection Time   10/22/11 10:34 PM      Component Value Range   Sodium 134 (*) 135 - 145 (mEq/L)   Potassium 4.1  3.5 - 5.1 (mEq/L)   Chloride 98  96 - 112 (mEq/L)   CO2 28  19 - 32 (mEq/L)   Glucose, Bld 346 (*) 70 - 99 (mg/dL)   BUN 27 (*) 6 - 23 (mg/dL)   Creatinine, Ser 8.11 (*) 0.50 - 1.10 (mg/dL)   Calcium 9.0  8.4 - 91.4 (mg/dL)   Total Protein 7.8  6.0 - 8.3 (g/dL)   Albumin 3.2 (*) 3.5 - 5.2 (g/dL)   AST 26  0 - 37 (U/L)   ALT 17  0 - 35 (U/L)   Alkaline Phosphatase 104  39 - 117 (U/L)   Total Bilirubin 0.3  0.3 - 1.2 (mg/dL)   GFR calc non Af Amer 33 (*) >90 (mL/min)   GFR calc Af Amer 39 (*) >90 (mL/min)  TYPE AND SCREEN     Status: Normal (Preliminary result)   Collection Time   10/22/11 10:34 PM      Component Value Range   ABO/RH(D) A POS     Antibody Screen NEG     Sample Expiration 10/25/2011     Unit Number [id number]     Blood Component Type RED CELLS,LR     Unit division 00     Status of Unit ISSUED     Transfusion Status OK TO TRANSFUSE     Crossmatch Result Compatible    ABO/RH     Status: Normal  Collection Time   10/22/11 10:34 PM      Component Value Range   ABO/RH(D) A POS    OCCULT BLOOD, POC DEVICE     Status: Normal   Collection Time   10/22/11 11:36 PM      Component Value Range    Fecal Occult Bld POSITIVE    PREPARE RBC (CROSSMATCH)     Status: Normal   Collection Time   10/23/11  1:30 AM      Component Value Range   Order Confirmation ORDER PROCESSED BY BLOOD BANK    GLUCOSE, CAPILLARY     Status: Abnormal   Collection Time   10/23/11  2:37 AM      Component Value Range   Glucose-Capillary 251 (*) 70 - 99 (mg/dL)  GLUCOSE, CAPILLARY     Status: Abnormal   Collection Time   10/23/11  8:28 AM      Component Value Range   Glucose-Capillary 194 (*) 70 - 99 (mg/dL)     No results found.  ROS:  As stated above in the HPI otherwise negative.  Blood pressure 131/70, pulse 60, temperature 97.8 F (36.6 C), temperature source Oral, resp. rate 20, height 5\' 3"  (1.6 m), weight 97 kg (213 lb 13.5 oz), SpO2 97.00%.    PE: Gen: NAD, Alert and Oriented HEENT:  [place]/AT, EOMI Neck: Supple, no LAD Lungs: CTA Bilaterally CV: RRR without M/G/R ABM: Soft, NTND, +BS Ext: No C/C/E  Assessment/Plan: 1) Hematochezia. 2) Afib. 3) Renal failure.   She does have hematochezia and her current INR is at 2.1.  In the past the prior work up for her heme positive stools were negative.  With the current ematochezia there is a higher likelihood of identifying the source.  I will schedule her for an EGD/Colonoscopy tomorrow.  Plan: 1) EGD/Colonoscopy tomorrow AM.  Jeani Hawking D 10/23/2011, 8:39 AM

## 2011-10-23 NOTE — Progress Notes (Signed)
Patient heart rate sustaining between 35-46, patientt denies any distress in the chair talking on the phone, Dr Waynard Edwards informed and order given to decrease dose of lopressor. will continue to assess patient.

## 2011-10-23 NOTE — ED Notes (Addendum)
EKG performed along with previous EKG. EKG given to ERP [facility]

## 2011-10-24 ENCOUNTER — Encounter (HOSPITAL_COMMUNITY): Payer: Self-pay | Admitting: Gastroenterology

## 2011-10-24 ENCOUNTER — Encounter (HOSPITAL_COMMUNITY)
Admission: EM | Disposition: A | Discharge status: Home / Self Care | Payer: Self-pay | Source: Home / Self Care | Attending: Internal Medicine

## 2011-10-24 LAB — CBC
MCH: 30.1 pg (ref 26.0–34.0)
MCHC: 33.1 g/dL (ref 30.0–36.0)
Platelets: 148 10*3/uL — ABNORMAL LOW (ref 150–400)
RDW: 17 % — ABNORMAL HIGH (ref 11.5–15.5)

## 2011-10-24 LAB — COMPREHENSIVE METABOLIC PANEL
ALT: 17 U/L (ref 0–35)
AST: 30 U/L (ref 0–37)
Alkaline Phosphatase: 89 U/L (ref 39–117)
CO2: 24 mEq/L (ref 19–32)
Chloride: 102 mEq/L (ref 96–112)
GFR calc non Af Amer: 39 mL/min — ABNORMAL LOW (ref >90)
Sodium: 137 mEq/L (ref 135–145)
Total Bilirubin: 0.4 mg/dL (ref 0.3–1.2)

## 2011-10-24 LAB — TYPE AND SCREEN: ABO/RH(D): A POS

## 2011-10-24 LAB — GLUCOSE, CAPILLARY
Glucose-Capillary: 122 mg/dL — ABNORMAL HIGH (ref 70–99)
Glucose-Capillary: 126 mg/dL — ABNORMAL HIGH (ref 70–99)
Glucose-Capillary: 333 mg/dL — ABNORMAL HIGH (ref 70–99)

## 2011-10-24 LAB — DIFFERENTIAL
Basophils Relative: 0 % (ref 0–1)
Eosinophils Absolute: 0.2 10*3/uL (ref 0.0–0.7)
Monocytes Relative: 11 % (ref 3–12)
Neutrophils Relative %: 47 % (ref 43–77)

## 2011-10-24 LAB — PROTIME-INR
INR: 2.24 — ABNORMAL HIGH (ref 0.00–1.49)
Prothrombin Time: 25.2 seconds — ABNORMAL HIGH (ref 11.6–15.2)

## 2011-10-24 SURGERY — COLONOSCOPY WITH ESOPHAGOGASTRODUODENOSCOPY (EGD)
Anesthesia: Moderate Sedation | Laterality: Left

## 2011-10-24 MED ORDER — BUTAMBEN-TETRACAINE-BENZOCAINE 2-2-14 % EX AERO
INHALATION_SPRAY | CUTANEOUS | Status: DC | PRN
  Administered 2011-10-24: 2 via TOPICAL

## 2011-10-24 MED ORDER — FENTANYL CITRATE 0.05 MG/ML IJ SOLN
INTRAMUSCULAR | Status: DC | PRN
  Administered 2011-10-24 (×3): 12.5 ug via INTRAVENOUS
  Administered 2011-10-24: 25 ug via INTRAVENOUS

## 2011-10-24 MED ORDER — MIDAZOLAM HCL 10 MG/2ML IJ SOLN
INTRAMUSCULAR | Status: DC | PRN
  Administered 2011-10-24: 2 mg via INTRAVENOUS
  Administered 2011-10-24 (×3): 1 mg via INTRAVENOUS

## 2011-10-24 MED ORDER — DEXTROSE 50 % IV SOLN
1.0000 | Freq: Once | INTRAVENOUS | Status: AC
  Administered 2011-10-24: 50 mL via INTRAVENOUS
  Filled 2011-10-24: qty 50

## 2011-10-24 NOTE — Op Note (Signed)
[facility] [address] [place], [place]  [address]  OPERATIVE PROCEDURE REPORT  PATIENT:  [name], [name]  MR#:  [id number] BIRTHDATE:  03/07/1933  GENDER:  female ENDOSCOPIST:  Jeani Hawking, MD ASSISTANT:  Beryle Beams, Technician and Janae Sauce, RN PROCEDURE DATE:  10/24/2011 PROCEDURE:  EGD, diagnostic [phone] ASA CLASS:  Class III INDICATIONS:  Anemia and hematochezia MEDICATIONS:  Fentanyl 37.5 mcg IV, Versed 3 mg IV TOPICAL ANESTHETIC:  Cetacaine Spray  DESCRIPTION OF PROCEDURE:   After the risks benefits and alternatives of the procedure were thoroughly explained, informed consent was obtained.  The Pentax Gastroscope [id number] endoscope was introduced through the mouth and advanced to the second portion of the duodenum, without limitations.  The instrument was slowly withdrawn as the mucosa was fully examined. <<PROCEDUREIMAGES>>  FINDINGS:  A 5 cm hiatal hernia was identified. In the antrum there was evidence of trauma. Before the procedure started she started to have wretching and it is felt that this is the cause of the trauma to the mucosa. No evidence of any inflammation, ulcerations, erosions, or vascular abnormalities.    Retroflexed views revealed no abnormalities.    The scope was then withdrawn from the patient and the procedure terminated.  COMPLICATIONS:  None  IMPRESSION:  1) Hiatal hernia RECOMMENDATIONS:  1) Proceed with the colonoscopy.  ______________________________ Jeani Hawking, MD  n. Rosalie DoctorJeani Hawking at 10/24/2011 08:34 AM  Pauline Aus, [phone]

## 2011-10-24 NOTE — H&P (View-Only) (Signed)
Subjective: Finished Go-Lytely and scopes upcoming.  Some mild blood tinge with BMs this am. Some Bradycardia and lopressor backed off.  Objective: Vital signs in last 24 hours: Temp:  [97.5 F (36.4 C)-97.9 F (36.6 C)] 97.7 F (36.5 C) (04/22 0633) Pulse Rate:  [46-75] 69  (04/22 0633) Resp:  [18-20] 18  (04/22 0633) BP: (128-181)/(69-84) 157/72 mmHg (04/22 0633) SpO2:  [100 %] 100 % (04/22 0633) Weight:  [98.4 kg (216 lb 14.9 oz)-101.2 kg (223 lb 1.7 oz)] 101.2 kg (223 lb 1.7 oz) (04/22 0504) Weight change: 1.4 kg (3 lb 1.4 oz) Last BM Date: 10/22/11  CBG (last 3)   Basename 10/24/11 0634 10/23/11 2107 10/23/11 1649  GLUCAP 72 223* 300*    Intake/Output from previous day:  Intake/Output Summary (Last 24 hours) at 10/24/11 0703 Last data filed at 10/23/11 1500  Gross per 24 hour  Intake   1200 ml  Output   1200 ml  Net      0 ml   04/21 0701 - 04/22 0700 In: 1200 [P.O.:1200] Out: 1200 [Urine:1200]   Physical Exam Gen: NAD, Alert HEENT: [place]/AT, EOMI  Neck: Supple, no LAD  Lungs: CTA Bilaterally  CV: RRR without M/G/R  ABM: Soft, NTND, +BS, Obese Ext: 1+Edema       Lab Results:  Basename 10/24/11 0403 10/22/11 2234  NA 137 134*  K 3.5 4.1  CL 102 98  CO2 24 28  GLUCOSE 76 346*  BUN 18 27*  CREATININE 1.28* 1.47*  CALCIUM 8.6 9.0  MG -- --  PHOS -- --     Basename 10/24/11 0403 10/22/11 2234  AST 30 26  ALT 17 17  ALKPHOS 89 104  BILITOT 0.4 0.3  PROT 7.0 7.8  ALBUMIN 2.9* 3.2*     Basename 10/24/11 0403 10/23/11 0950  WBC 7.0 5.9  NEUTROABS 3.3 --  HGB 9.6* 9.3*  HCT 29.0* 28.8*  MCV 90.9 92.3  PLT 148* 137*    Lab Results  Component Value Date   INR 2.24* 10/24/2011   INR 2.12* 10/22/2011   INR 2.10* 05/16/2011    No results found for this basename: CKTOTAL:3,CKMB:3,CKMBINDEX:3,TROPONINI:3 in the last 72 hours   Basename 10/23/11 0950  TSH 1.636  T4TOTAL --  T3FREE --  THYROIDAB --    No results found for this  basename: VITAMINB12:2,FOLATE:2,FERRITIN:2,TIBC:2,IRON:2,RETICCTPCT:2 in the last 72 hours  Micro Results: No results found for this or any previous visit (from the past 240 hour(s)).   Studies/Results: No results found.   Medications: Scheduled:   . amitriptyline  75 mg Oral QHS  . aspirin  81 mg Oral Daily  . atorvastatin  40 mg Oral q1800  . cholecalciferol  2,000 Units Oral Daily  . darbepoetin (ARANESP) injection - NON-DIALYSIS  200 mcg Subcutaneous Q30 days  . dextrose  1 ampule Intravenous Once  . donepezil  5 mg Oral QHS  . ferrous sulfate  325 mg Oral Daily  . furosemide  20 mg Oral Daily  . insulin aspart  0-5 Units Subcutaneous QHS  . insulin aspart  0-9 Units Subcutaneous TID WC  . insulin glargine  25 Units Subcutaneous QHS  . levothyroxine  100 mcg Oral QAC breakfast  . metoprolol tartrate  25 mg Oral BID  . pantoprazole  40 mg Oral Q1200  . polyethylene glycol-electrolytes  4,000 mL Oral Once  . sodium chloride  3 mL Intravenous Q12H  . sodium chloride  3 mL Intravenous Q12H  . vitamin B-12    1,000 mcg Oral Daily  . DISCONTD: darbepoetin  300 mcg Subcutaneous Q30 days  . DISCONTD: metoprolol tartrate  50 mg Oral QHS  . DISCONTD: metoprolol  50-75 mg Oral See admin instructions  . DISCONTD: metoprolol tartrate  75 mg Oral Daily  . DISCONTD: Vitamin B 12  1,000 mcg Oral Daily   Continuous:    Assessment/Plan: Principal Problem:  *GI bleed Active Problems:  CKD (chronic kidney disease) stage 3, GFR 30-59 ml/min  1) Hematochezia - Subacute GI bleeding. Suspect lower source. Coumadin stopped. PAS hose for dvt proph. Monitor h and h. One unit of blood given. GI consulted. She is due for next aranesp dose week. In the past the prior work up for her heme positive stools were negative. With the current hematochezia there is a higher likelihood of identifying the source. EGD/Colonoscopy today.   2) Afib - sounds nsr today. ecg shows nsr with rbbb. cxray suggest  mild chf but clinically she is compensated..  3) Chronic kidney Dz - Stable. Cr 1.28 4) Anemia - Aranesp - S/P 1U PRBC. Hbg 9.6 and stable   5)Hypothyroidism-TSH Fine.   6)Chronic anticoagulation-interrupt this for now.  Current INR still 2+   7)CAD (coronary artery disease)-stable. S/P CABG   8)Diastolic dysfunction-stable.    9)MGUS (monoclonal gammopathy of unknown significance)-follow   Full code status    DM2 - CBGs erratic.  Basename 10/24/11 0634 10/23/11 2107 10/23/11 1649  GLUCAP 72 223* 300*     LOS: 2 days   Faiz Weber M 10/24/2011, 7:03 AM    

## 2011-10-24 NOTE — Progress Notes (Signed)
Pt with CBG of 72. Pt asymptomatic. Will notify MD (Dr. Eloise Harman)

## 2011-10-24 NOTE — Op Note (Addendum)
[facility] [address] [place], [place]  [address]  COLONOSCOPY PROCEDURE REPORT  PATIENT:  [name], [name]  MR#:  [id number] BIRTHDATE:  [birth year], 79 yrs. old  GENDER:  female ENDOSCOPIST:  Jeani Hawking, MD  PROCEDURE DATE:  10/24/2011 PROCEDURE:  Colonoscopy 41324 ASA CLASS:  Class III INDICATIONS:  Hematochezia MEDICATIONS:   Fentanyl 25 mcg IV, Versed 2 mg IV  DESCRIPTION OF PROCEDURE:   After the risks benefits and alternatives of the procedure were thoroughly explained, informed consent was obtained.  Digital rectal exam was performed and revealed no abnormalities.   The 110536 endoscope was introduced through the anus and advanced to the cecum, which was identified by both the appendix and ileocecal valve, without limitations. The quality of the prep was good..  The instrument was then slowly withdrawn as the colon was fully examined. <<PROCEDUREIMAGES>>  FINDINGS:  Scattered diverticula were identified throughout the colon.  No evidence of any masses, inflmmation, ulcerations, erosions, or vascular abnormalities. No evdence of any active bleeding.  Retroflexed views in the rectum revealed internal and external hemorrhoids.   The scope was then withdrawn in  minutes from the cecum and the procedure completed.  COMPLICATIONS:  None ENDOSCOPIC IMPRESSION: 1) Diverticula - s/p diverticular bleed. 2) Internal and external hemorrhoids. RECOMMENDATIONS: 1) Supportive care.  ______________________________ Jeani Hawking, MD  n. REVISED:  10/26/2011 07:10 PM eSIGNED:   Jeani Hawking at 10/26/2011 07:10 PM  Pauline Aus, [phone]

## 2011-10-24 NOTE — ED Provider Notes (Signed)
Medical screening examination/treatment/procedure(s) were performed by non-physician practitioner and as supervising physician I was immediately available for consultation/collaboration.   Jacey Eckerson M Audley Hinojos, DO 10/24/11 0202 

## 2011-10-24 NOTE — Interval H&P Note (Signed)
History and Physical Interval Note:  10/24/2011 8:21 AM  [name]  has presented today for surgery, with the diagnosis of Hematochezia and anemia  The various methods of treatment have been discussed with the patient and family. After consideration of risks, benefits and other options for treatment, the patient has consented to  Procedure(s) (LRB): COLONOSCOPY WITH ESOPHAGOGASTRODUODENOSCOPY (EGD) (Left) as a surgical intervention .  The patients' history has been reviewed, patient examined, no change in status, stable for surgery.  I have reviewed the patients' chart and labs.  Questions were answered to the patient's satisfaction.     [name]

## 2011-10-24 NOTE — Progress Notes (Signed)
   CARE MANAGEMENT NOTE 10/24/2011  Patient:  [name], [name]   Account Number:  [id number]  Date Initiated:  10/24/2011  Documentation initiated by:  [facility]  Subjective/Objective Assessment:   ADMITTED W/BLOODY STOOL.     Action/Plan:   FROM HOME W/SPOUSE.   Anticipated DC Date:  10/26/2011   Anticipated DC Plan:  HOME/SELF CARE         Choice offered to / List presented to:             Status of service:  In process, will continue to follow Medicare Important Message given?   (If response is "NO", the following Medicare IM given date fields will be blank) Date Medicare IM given:   Date Additional Medicare IM given:    Discharge Disposition:    Per UR Regulation:  Reviewed for med. necessity/level of care/duration of stay  If discussed at Long Length of Stay Meetings, dates discussed:    Comments:  10/24/11 Siaosi Alter RN,BSN NCM [phone] ENDOSCOPY TODAY.

## 2011-10-24 NOTE — Progress Notes (Signed)
Subjective: Finished Go-Lytely and scopes upcoming.  Some mild blood tinge with BMs this am. Some Bradycardia and lopressor backed off.  Objective: Vital signs in last 24 hours: Temp:  [97.5 F (36.4 C)-97.9 F (36.6 C)] 97.7 F (36.5 C) (04/22 1610) Pulse Rate:  [46-75] 69  (04/22 0633) Resp:  [18-20] 18  (04/22 9604) BP: (128-181)/(69-84) 157/72 mmHg (04/22 0633) SpO2:  [100 %] 100 % (04/22 5409) Weight:  [98.4 kg (216 lb 14.9 oz)-101.2 kg (223 lb 1.7 oz)] 101.2 kg (223 lb 1.7 oz) (04/22 0504) Weight change: 1.4 kg (3 lb 1.4 oz) Last BM Date: 10/22/11  CBG (last 3)   Basename 10/24/11 0634 10/23/11 2107 10/23/11 1649  GLUCAP 72 223* 300*    Intake/Output from previous day:  Intake/Output Summary (Last 24 hours) at 10/24/11 0703 Last data filed at 10/23/11 1500  Gross per 24 hour  Intake   1200 ml  Output   1200 ml  Net      0 ml   04/21 0701 - 04/22 0700 In: 1200 [P.O.:1200] Out: 1200 [Urine:1200]   Physical Exam Gen: NAD, Alert HEENT: [place]/AT, EOMI  Neck: Supple, no LAD  Lungs: CTA Bilaterally  CV: RRR without M/G/R  ABM: Soft, NTND, +BS, Obese Ext: 1+Edema       Lab Results:  [facility] 10/24/11 0403 10/22/11 2234  NA 137 134*  K 3.5 4.1  CL 102 98  CO2 24 28  GLUCOSE 76 346*  BUN 18 27*  CREATININE 1.28* 1.47*  CALCIUM 8.6 9.0  MG -- --  PHOS -- --     Basename 10/24/11 0403 10/22/11 2234  AST 30 26  ALT 17 17  ALKPHOS 89 104  BILITOT 0.4 0.3  PROT 7.0 7.8  ALBUMIN 2.9* 3.2*     Basename 10/24/11 0403 10/23/11 0950  WBC 7.0 5.9  NEUTROABS 3.3 --  HGB 9.6* 9.3*  HCT 29.0* 28.8*  MCV 90.9 92.3  PLT 148* 137*    Lab Results  Component Value Date   INR 2.24* 10/24/2011   INR 2.12* 10/22/2011   INR 2.10* 05/16/2011    No results found for this basename: CKTOTAL:3,CKMB:3,CKMBINDEX:3,TROPONINI:3 in the last 72 hours   Basename 10/23/11 0950  TSH 1.636  T4TOTAL --  T3FREE --  THYROIDAB --    No results found for this  basename: VITAMINB12:2,FOLATE:2,FERRITIN:2,TIBC:2,IRON:2,RETICCTPCT:2 in the last 72 hours  Micro Results: No results found for this or any previous visit (from the past 240 hour(s)).   Studies/Results: No results found.   Medications: Scheduled:   . amitriptyline  75 mg Oral QHS  . aspirin  81 mg Oral Daily  . atorvastatin  40 mg Oral q1800  . cholecalciferol  2,000 Units Oral Daily  . darbepoetin (ARANESP) injection - NON-DIALYSIS  200 mcg Subcutaneous Q30 days  . dextrose  1 ampule Intravenous Once  . donepezil  5 mg Oral QHS  . ferrous sulfate  325 mg Oral Daily  . furosemide  20 mg Oral Daily  . insulin aspart  0-5 Units Subcutaneous QHS  . insulin aspart  0-9 Units Subcutaneous TID WC  . insulin glargine  25 Units Subcutaneous QHS  . levothyroxine  100 mcg Oral QAC breakfast  . metoprolol tartrate  25 mg Oral BID  . pantoprazole  40 mg Oral Q1200  . polyethylene glycol-electrolytes  4,000 mL Oral Once  . sodium chloride  3 mL Intravenous Q12H  . sodium chloride  3 mL Intravenous Q12H  . vitamin B-12  1,000 mcg Oral Daily  . DISCONTD: darbepoetin  300 mcg Subcutaneous Q30 days  . DISCONTD: metoprolol tartrate  50 mg Oral QHS  . DISCONTD: metoprolol  50-75 mg Oral See admin instructions  . DISCONTD: metoprolol tartrate  75 mg Oral Daily  . DISCONTD: Vitamin B 12  1,000 mcg Oral Daily   Continuous:    Assessment/Plan: Principal Problem:  *GI bleed Active Problems:  CKD (chronic kidney disease) stage 3, GFR 30-59 ml/min  1) Hematochezia - Subacute GI bleeding. Suspect lower source. Coumadin stopped. PAS hose for dvt proph. Monitor h and h. One unit of blood given. GI consulted. She is due for next aranesp dose week. In the past the prior work up for her heme positive stools were negative. With the current hematochezia there is a higher likelihood of identifying the source. EGD/Colonoscopy today.   2) Afib - sounds nsr today. ecg shows nsr with rbbb. cxray suggest  mild chf but clinically she is compensated.Marland Kitchen  3) Chronic kidney Dz - Stable. Cr 1.28 4) Anemia - Aranesp - S/P 1U PRBC. Hbg 9.6 and stable   5)Hypothyroidism-TSH Fine.   6)Chronic anticoagulation-interrupt this for now.  Current INR still 2+   7)CAD (coronary artery disease)-stable. S/P CABG   8)Diastolic dysfunction-stable.    9)MGUS (monoclonal gammopathy of unknown significance)-follow   Full code status    DM2 - CBGs erratic.  Basename 10/24/11 0634 10/23/11 2107 10/23/11 1649  GLUCAP 72 223* 300*     LOS: 2 days   Regino Fournet M 10/24/2011, 7:03 AM

## 2011-10-25 LAB — CBC
MCH: 29.7 pg (ref 26.0–34.0)
MCHC: 31.8 g/dL (ref 30.0–36.0)
MCV: 93.6 fL (ref 78.0–100.0)
Platelets: 143 10*3/uL — ABNORMAL LOW (ref 150–400)
Platelets: 158 10*3/uL (ref 150–400)
RBC: 2.96 MIL/uL — ABNORMAL LOW (ref 3.87–5.11)
RDW: 17 % — ABNORMAL HIGH (ref 11.5–15.5)
WBC: 5.6 10*3/uL (ref 4.0–10.5)

## 2011-10-25 LAB — DIFFERENTIAL
Basophils Relative: 0 % (ref 0–1)
Eosinophils Absolute: 0.1 10*3/uL (ref 0.0–0.7)
Eosinophils Relative: 3 % (ref 0–5)
Lymphs Abs: 1.7 10*3/uL (ref 0.7–4.0)

## 2011-10-25 LAB — COMPREHENSIVE METABOLIC PANEL
ALT: 16 U/L (ref 0–35)
AST: 26 U/L (ref 0–37)
Albumin: 2.9 g/dL — ABNORMAL LOW (ref 3.5–5.2)
Alkaline Phosphatase: 93 U/L (ref 39–117)
CO2: 26 mEq/L (ref 19–32)
Chloride: 104 mEq/L (ref 96–112)
Creatinine, Ser: 1.52 mg/dL — ABNORMAL HIGH (ref 0.50–1.10)
Potassium: 3.9 mEq/L (ref 3.5–5.1)
Sodium: 137 mEq/L (ref 135–145)
Total Bilirubin: 0.4 mg/dL (ref 0.3–1.2)

## 2011-10-25 LAB — PROTIME-INR: Prothrombin Time: 21.1 seconds — ABNORMAL HIGH (ref 11.6–15.2)

## 2011-10-25 LAB — GLUCOSE, CAPILLARY: Glucose-Capillary: 274 mg/dL — ABNORMAL HIGH (ref 70–99)

## 2011-10-25 MED ORDER — INSULIN GLARGINE 100 UNIT/ML ~~LOC~~ SOLN: 50.0000 [IU] | Freq: Every day | SUBCUTANEOUS | Status: DC

## 2011-10-25 MED ORDER — METOPROLOL TARTRATE 50 MG PO TABS: 25.0000 mg | ORAL_TABLET | Freq: Two times a day (BID) | ORAL | Status: DC

## 2011-10-25 MED ORDER — FUROSEMIDE 40 MG PO TABS
40.0000 mg | ORAL_TABLET | Freq: Every day | ORAL | Status: DC
  Administered 2011-10-25: 40 mg via ORAL
  Filled 2011-10-25 (×2): qty 1

## 2011-10-25 MED ORDER — CYANOCOBALAMIN 1000 MCG PO TABS: 1000.0000 ug | ORAL_TABLET | Freq: Every day | ORAL | Status: DC

## 2011-10-25 NOTE — Progress Notes (Signed)
Subjective: No new C/O We discussed her scopes from yesterday. She stated she has eaten and walked around room. No more bleeding. i anticipated D/cing her today, but her Hbg fell again.  Objective: Vital signs in last 24 hours: Temp:  [97.7 F (36.5 C)-98.7 F (37.1 C)] 98.5 F (36.9 C) (04/23 0450) Pulse Rate:  [71-79] 75  (04/23 0450) Resp:  [12-30] 14  (04/23 0450) BP: (98-212)/(46-134) 137/66 mmHg (04/23 0450) SpO2:  [82 %-100 %] 95 % (04/23 0450) Weight:  [100.472 kg (221 lb 8 oz)] 100.472 kg (221 lb 8 oz) (04/23 0450) Weight change: 2.072 kg (4 lb 9.1 oz) Last BM Date: 10/24/11  CBG (last 3)   Basename 10/24/11 2129 10/24/11 1704 10/24/11 1156  GLUCAP 333* 237* 126*    Intake/Output from previous day:  Intake/Output Summary (Last 24 hours) at 10/25/11 0704 Last data filed at 10/24/11 1257  Gross per 24 hour  Intake    240 ml  Output      0 ml  Net    240 ml   04/22 0701 - 04/23 0700 In: 240 [P.O.:240] Out: -    Physical Exam Gen: NAD, Alert HEENT: [employer]/AT, EOMI  Neck: Supple, no LAD  Lungs: CTA Bilaterally  CV: RRR without M/G/R  ABM: Soft, NTND, +BS, Obese Ext: 1+Edema   TELE NSR HR 71    Lab Results:  Basename 10/25/11 0512 10/24/11 0403  NA 137 137  K 3.9 3.5  CL 104 102  CO2 26 24  GLUCOSE 309* 76  BUN 15 18  CREATININE 1.52* 1.28*  CALCIUM 8.6 8.6  MG -- --  PHOS -- --     Basename 10/25/11 0512 10/24/11 0403  AST 26 30  ALT 16 17  ALKPHOS 93 89  BILITOT 0.4 0.4  PROT 6.7 7.0  ALBUMIN 2.9* 2.9*     Basename 10/25/11 0512 10/24/11 0403  WBC 5.2 7.0  NEUTROABS 2.7 3.3  HGB 8.8* 9.6*  HCT 27.7* 29.0*  MCV 93.6 90.9  PLT 143* 148*    Lab Results  Component Value Date   INR 1.79* 10/25/2011   INR 2.24* 10/24/2011   INR 2.12* 10/22/2011    No results found for this basename: CKTOTAL:3,CKMB:3,CKMBINDEX:3,TROPONINI:3 in the last 72 hours   Basename 10/23/11 0950  TSH 1.636  T4TOTAL --  T3FREE --  THYROIDAB --     No results found for this basename: VITAMINB12:2,FOLATE:2,FERRITIN:2,TIBC:2,IRON:2,RETICCTPCT:2 in the last 72 hours  Micro Results: No results found for this or any previous visit (from the past 240 hour(s)).   Studies/Results: No results found.   Medications: Scheduled:    . amitriptyline  75 mg Oral QHS  . aspirin  81 mg Oral Daily  . atorvastatin  40 mg Oral q1800  . cholecalciferol  2,000 Units Oral Daily  . darbepoetin (ARANESP) injection - NON-DIALYSIS  200 mcg Subcutaneous Q30 days  . dextrose  1 ampule Intravenous Once  . donepezil  5 mg Oral QHS  . ferrous sulfate  325 mg Oral Daily  . furosemide  20 mg Oral Daily  . insulin aspart  0-5 Units Subcutaneous QHS  . insulin aspart  0-9 Units Subcutaneous TID WC  . insulin glargine  25 Units Subcutaneous QHS  . levothyroxine  100 mcg Oral QAC breakfast  . metoprolol tartrate  25 mg Oral BID  . pantoprazole  40 mg Oral Q1200  . sodium chloride  3 mL Intravenous Q12H  . sodium chloride  3 mL Intravenous Q12H  .  vitamin B-12  1,000 mcg Oral Daily   Continuous:    Assessment/Plan: Principal Problem:  *GI bleed Active Problems:  CKD (chronic kidney disease) stage 3, GFR 30-59 ml/min  1) Hematochezia - Subacute GI bleeding. Scopes revealed 5 cm HH but o/w unremarkable EGD, and colon revealed Diverticula - s/p diverticular bleed.  2) Internal and external hemorrhoids with recommendations of Supportive care. Coumadin stopped and I want to know from GI when it may be resumed - My assumption is 1 week after Hbgs normalized and stabilized??. PAS hose for dvt proph. Monitor h and h. One unit of blood already given. She is due for next aranesp dose week. In the past the prior work up for her heme positive stools were negative.   Continue current ASA.  2) Afib - Remains NSR on Tele. HR excellent. cxray suggest mild chf but clinically she is compensated.   3) Chronic kidney Dz - Stable. Cr 1.28 -1.58  4) Anemia - Aranesp -  S/P 1U PRBC. Hbg 9.6 and trended down to 8.8.  Follow more Hbgs and then decide on D/C. Continue Iron   5)Hypothyroidism-TSH Fine.   6)Chronic anticoagulation-interrupt this for now.  Current INR trending down.  When can we restart?   7)CAD (coronary artery disease)-stable. S/P CABG   8)Diastolic dysfunction-stable.    9)MGUS (monoclonal gammopathy of unknown significance)-follow   10) HTN - Poor control - Erratic and can jump up.  tighten where able.  Currently fine.  11) Full code status   12) Dementia - Aricept.   13) DM2 - CBGs erratic. Tighten up.  Lantus and ISS  [facility] 10/24/11 2129 10/24/11 1704 10/24/11 1156  GLUCAP 333* 237* 126*     LOS: 3 days   [name] M 10/25/2011, 7:04 AM

## 2011-10-25 NOTE — Discharge Summary (Signed)
Physician Discharge Summary  DISCHARGE SUMMARY   Patient ID: [name] MR#: [id number] DOB/AGE: 1932/12/26 78 y.o.   Attending Physician:Emslee Lopezmartinez M  Patient's HAL:[name],[name], MD, MD  Consults: **  Admit date: 10/22/2011 Discharge date: 10/25/2011  Discharge Diagnoses:  Principal Problem:  *GI bleed Active Problems:  CKD (chronic kidney disease) stage 3, GFR 30-59 ml/min   Patient Active Problem List  Diagnoses  . Atrial fibrillation  . Hypothyroidism  . Chronic anticoagulation  . CAD (coronary artery disease)  . Diastolic dysfunction  . Anemia associated with chronic renal failure  . Atrial tachycardia  . Right heart failure  . MGUS (monoclonal gammopathy of unknown significance)  . Unspecified deficiency anemia  . GI bleed  . CKD (chronic kidney disease) stage 3, GFR 30-59 ml/min   Past Medical History  Diagnosis Date  . Coronary artery disease     Severe two vessel; s/p CABG  . Hypertension   . Diabetes mellitus     Type 2  . Hyperlipidemia   . Diabetic neuropathy   . PAF (paroxysmal atrial fibrillation)     failed medical therapy with amiodarone; not a candidate for class IC meds due to her CAD. No Multaq due to CHF hx; QT prolonged with amiodarone; Not able to be ablated due to valve disease  . CVA (cerebral vascular accident) 2004  . Hypothyroidism   . Chronic anemia   . Diastolic dysfunction     per echo in May of 2012  . GERD (gastroesophageal reflux disease)   . Arthritis   . Valvular heart disease     has moderate mitral insufficiency with severe tricuspid insufficiency per TEE in November 2012   DM2 with Neuropathy/Non-Prolif DM Retinopathy/Cr 1.3-1.8 without microalbuminuria (- Renal US),  CAD/NQWMI/3vCABG 06/2009/ Mild CM c EF 50%  Dr [place].  HTN,  Chronic Anticoagulation for Parox Aflutter (Parox Afib post CABG Surgery) - AFIB c RVR admission 11/2010,  Hyperlipidemia,  Vit D def,   GERD, Hypothyroidism,  Chronic Anemia -  Multifactorial with elevated ESR/ANR 1:160 Atypical Speckled Pattern/IgG  Lambda Monoclonal Gammopathy of Unknown Significance/B12 Def on Aranesp/Iron/B12 per Dr Arline Asp,  OA esp L Knee per Dr Lequita Halt,  Ventral Hernia,  Diverticulitis.   H/O CVA 10/04.  LE Edema/CVI/venous Stasis, Mild AV Stenosis, Mild MR and TR.  Fibrocystic Breast Bx 09/2009,  Insomnia,  DDD L/S Spine,  Sig Bronchitis with Hemoptysis 09/13/10 (-) CXR, Obesity but weight dropped from 232 prior to CABG to 202 09/2010 Surgical History (reviewed - no changes required): TAH 1979.  Fallopian tube tumor surgery 04/02. R knee arthroscopy 2/03.  R cataract.  L cataract. R tkr 6/05.   T&A as a child.   CAD/3vCABG 06/2009. Breast Bx (-) 2011     Discharge Medications: Medication List  As of 10/25/2011  3:55 PM   STOP taking these medications         warfarin 5 MG tablet         TAKE these medications         amitriptyline 25 MG tablet   Commonly known as: ELAVIL   Take 75 mg by mouth at bedtime.      aspirin 81 MG tablet   Take 81 mg by mouth daily.      atorvastatin 40 MG tablet   Commonly known as: LIPITOR   Take 40 mg by mouth every evening.      clobetasol cream 0.05 %   Commonly known as: TEMOVATE   Place 1  application vaginally as needed. itching      VITAMIN B 12 PO   Take 1 tablet by mouth daily.      cyanocobalamin 1000 MCG tablet   Take 1 tablet (1,000 mcg total) by mouth daily.      darbepoetin 200 MCG/0.4ML Soln   Commonly known as: ARANESP   Inject 200 mcg into the skin every 30 (thirty) days.      donepezil 5 MG tablet   Commonly known as: ARICEPT   Take 5 mg by mouth at bedtime.      esomeprazole 40 MG capsule   Commonly known as: NEXIUM   Take 40 mg by mouth daily before breakfast.      fish oil-omega-3 fatty acids 1000 MG capsule   Take 1 g by mouth daily.      furosemide 20 MG tablet   Commonly known as: LASIX   Take 1 tablet (20 mg total) by mouth 2 (two) times daily. 1 tablet in the AM  and 1 tablet in the PM      glimepiride 4 MG tablet   Commonly known as: AMARYL   Take 4 mg by mouth 2 (two) times daily.      insulin glargine 100 UNIT/ML injection   Commonly known as: LANTUS   Inject 50 Units into the skin at bedtime.      insulin lispro 100 UNIT/ML injection   Commonly known as: HUMALOG   Inject 28 Units into the skin 3 (three) times daily before meals.      IRON PO   Take 1 tablet by mouth daily.      levothyroxine 100 MCG tablet   Commonly known as: SYNTHROID, LEVOTHROID   Take 100 mcg by mouth daily.      MAGNESIUM PO   Take 1 tablet by mouth daily.      metoprolol 50 MG tablet   Commonly known as: LOPRESSOR   Take 0.5 tablets (25 mg total) by mouth 2 (two) times daily.      Vitamin D 2000 UNITS tablet   Take 2,000 Units by mouth daily.            Complete Medication List: 1)  Donepezil Hcl 5 Mg Tbdp (Donepezil hcl) .... One po hs 2)  Magnesium 250 Mg Tabs (Magnesium) .... Take 1 tablet by mouth once a day 3)  Amitriptyline Hcl 25 Mg Tabs (Amitriptyline hcl) .... 3 po qhs 4)  Aranesp (albumin Free) 300 Mcg/ml Soln (Darbepoetin alfa-polysorbate) .... Once monthly 5)  Fish Oil Oil (Fish oil) .... Qd 6)  Lantus 100 Unit/ml Soln (Insulin glargine) .... 60 units daily/hs 7)  Metoprolol Succinate 50 Mg Xr24h-tab (Metoprolol succinate) .Marland Kitchen.. 1 po bid 8)  Synthroid 100 Mcg Tabs (Levothyroxine sodium) .Marland Kitchen.. 1 po qd 9)  Vitamin D3 1000 Unit Tabs (Cholecalciferol) .Marland Kitchen.. 1 po qd 10)  Lipitor 40 Mg Tabs (Atorvastatin calcium) .... One po daily 11)  Furosemide 20 Mg Tabs (Furosemide) .Marland Kitchen.. 1 po bid 12)  Coumadin 10 Mg Tabs (Warfarin sodium) .... 10mg  x 5 days per week and 5mg  x 2 days per week 13)  Nexium 40 Mg Cpdr (Esomeprazole magnesium) .Marland Kitchen.. 1 po qd 14)  Amaryl 4 Mg Tabs (Glimepiride) .Marland Kitchen.. 1 po bid 15)  Aspirin 81 Mg Ec Tab (Aspirin) .... Take one (1) tablet by mouth daily 16)  Humalog 100 Unit/ml Soln (Insulin lispro (human)) .... She is to do the  humalog 15-20 units 3 times per day with meals.  if  cbg 100-199 give 15 units and if cbg > 200 give 20 units 17)  Cyanocobalamin 1000 Mcg/ml Inj Soln (Cyanocobalamin) .... Once per month    Hospital Procedures: No results found.  History of Present Illness: [name] is a pleasant female with complex medical history including CKD on chronic aranesp shots and anticoagulation for a-fib. She had eval in past for heme positive stool with dr. Kinnie Scales including colonoscopy 2009 and 5 yrs before that she had upper endoscopy, colonoscopy and capsule endoscopy for anemia but it is not clear that any source was ever found. She has noted a week or so of darker stool, some maroon and three maroon stools. [facility] and hgb 9.2 had she had grossly bloody stool. Baseline hgb is 11.6 from 1/13. She is admitted for further eval. She has felt weak and washed out the last few days but denies fever or abd pain or cp.   Hospital Course: 1) Hematochezia - Subacute GI bleeding. Dr Elnoria Howard did Scopes which revealed 5 cm HH but o/w unremarkable EGD, and colon revealed Diverticula - s/p diverticular bleed. hbg 8.8 in am and 9.6 in the afternoon.  She got up and walked with nursing.  She ate and had no other Sxs.  I called @ 3 pm and she is ready for D/C, so we discharged her.  2) Internal and external hemorrhoids with recommendations of Supportive care. Coumadin stopped and I want to know from GI when it may be resumed - My assumption is 1 week after Hbgs normalized and stabilized??. GI did not round on her today and since no active bleeding I do not see any reason not to re-challenge her next week.  PAS hose for dvt proph was provided. We Monitored h and h. One unit of blood was given. She is due for next aranesp dose week. In the past the prior work up for her heme positive stools were negative. Continue current ASA.   3) Afib - Remains NSR on Tele. HR excellent. cxray suggest mild chf but clinically she is  compensated.   4) Chronic kidney Dz - Stable. Cr 1.28 -1.58   5) Anemia - Aranesp - S/P 1U PRBC. Hbg 9.6 and trended down to 8.8 and back to 9.6. Recheck at outPt. Continue Iron. per Dr Arline Asp.  6)Hypothyroidism-TSH Fine.   7)Chronic anticoagulation-interrupt this for now. Current INR trending down. When can we restart?  NEXT WEEK?  8)CAD (coronary artery disease)-stable. S/P CABG  Just saw Dr [place] 08/19/11 for Afib/PAT/R CHF and CAD - Stable. I am OK with Quinine Water as long as it is working.  9)Diastolic dysfunction-stable.   10)MGUS (monoclonal gammopathy of unknown significance)-follow   11) HTN - Poor control - Erratic and can jump up. tighten where able. Currently fine.   11) Full code status   12) Dementia - Aricept.   13) DIABETES MELLITUS, TYPE II, UNCONTROLLED, W/VASCULAR COMPS (ICD-250.72) DM2 with Neuropathy/Non-Prolif DM Retinopathy/Cr 1.3-1.8 without microalbuminuria (- Renal US),   - CBGs erratic. Tighten up. Lantus and ISS   [facility]  10/24/11 2129  10/24/11 1704  10/24/11 1156   GLUCAP  333*  237*  126*    14) OBESITY (ICD-278.00) - Needs weight loss.   15)  MEMORY LOSS (ICD-780.93)  MMSE 25/30, mild impairment  Aricept helping to stabilize memory. Husband says OK response..  16)  FATIGUE (ICD-780.79)   17)  DEPENDENT EDEMA, LEGS, BILATERAL (ICD-782.3) -  LE Edema/CVI/venous Stasis     Furosemide 20  Mg Tabs (Furosemide) .Marland Kitchen... 1 po bid  18)  HYPERLIPIDEMIA (ICD-272.4)  - Lipids are not ideal or at goal despite Lipitor 40.    Day of Discharge Exam BP 122/74  Pulse 73  Temp(Src) 98.5 F (36.9 C) (Oral)  Resp 18  Ht 5\' 3"  (1.6 m)  Wt 100.472 kg (221 lb 8 oz)  BMI 39.24 kg/m2  SpO2 96%  Physical Exam: See this ams PE  Discharge Labs:  [facility] 10/25/11 0512 10/24/11 0403  NA 137 137  K 3.9 3.5  CL 104 102  CO2 26 24  GLUCOSE 309* 76  BUN 15 18  CREATININE 1.52* 1.28*  CALCIUM 8.6 8.6  MG -- --  PHOS -- --    Basename  10/25/11 0512 10/24/11 0403  AST 26 30  ALT 16 17  ALKPHOS 93 89  BILITOT 0.4 0.4  PROT 6.7 7.0  ALBUMIN 2.9* 2.9*    Basename 10/25/11 1316 10/25/11 0512 10/24/11 0403  WBC 5.6 5.2 --  NEUTROABS -- 2.7 3.3  HGB 9.6* 8.8* --  HCT 29.5* 27.7* --  MCV 92.8 93.6 --  PLT 158 143* --   No results found for this basename: CKTOTAL:3,CKMB:3,CKMBINDEX:3,TROPONINI:3 in the last 72 hours  Basename 10/23/11 0950  TSH 1.636  T4TOTAL --  T3FREE --  THYROIDAB --   No results found for this basename: VITAMINB12:2,FOLATE:2,FERRITIN:2,TIBC:2,IRON:2,RETICCTPCT:2 in the last 72 hours Lab Results  Component Value Date   INR 1.79* 10/25/2011   INR 2.24* 10/24/2011   INR 2.12* 10/22/2011       Discharge instructions: Discharge Orders    Future Appointments: Provider: Department: Dept Phone: Center:   11/03/2011 12:15 PM Radene Gunning [facility] [phone] None   11/03/2011 12:45 PM [facility] [phone] None   11/16/2011 2:00 PM Peter M [place], MD [facility] [phone] None   12/01/2011 12:15 PM Krista Blue [facility] [phone] None   12/01/2011 12:45 PM [facility] [phone] None   12/29/2011 12:15 PM Krista Blue [facility] [phone] None   12/29/2011 12:45 PM [facility] [phone] None   01/26/2012 12:15 PM Radene Gunning [facility] [phone] None   01/26/2012 12:45 PM [facility] [phone] None   02/23/2012 11:30 AM Alvina Filbert [facility] [phone] None   02/23/2012 12:00 PM Samul Dada, MD [facility] [phone] None     01-Home or Self Care    Disposition: home  Follow-up Appts: Follow-up with Dr. Timothy Lasso [facility] in 1 week  Call for appointment.  Condition on Discharge: stable.  Tests Needing Follow-up: CBC and BMEt  Time spent in discharge (includes decision making & examination of pt): 30  min  Signed: Caili Escalera M 10/25/2011, 3:55 PM

## 2011-10-25 NOTE — Discharge Instructions (Signed)
Gastrointestinal Bleeding Gastrointestinal (GI) bleeding is bleeding from the gut or any place between your mouth and anus. If bleeding is slow, you may be allowed to go home. If there is a lot of bleeding, hospitalization and observation are often required. SYMPTOMS   You vomit bright red blood or material that looks like coffee grounds.   You have blood in your stools or the stools look black and tarry.  DIAGNOSIS  Your caregiver may diagnose your condition by taking a history and a physical exam. More tests may be needed, including:  X-rays.   EGD (esophagogastroduodenoscopy), which looks at your esophagus, stomach, and small bowel through a flexible telescope-like instrument.   Colonoscopy, which looks at your colon/large bowel through a flexible telescope-like instrument.   Biopsies, which remove a small sample of tissue to examine under a microscope.  Finding out the results of your test Not all test results are available during your visit. If your test results are not back during the visit, make an appointment with your caregiver to find out the results. Do not assume everything is normal if you have not heard from your caregiver or the medical facility. It is important for you to follow up on all of your test results. HOME CARE INSTRUCTIONS   Follow instructions as suggested by your caregiver regarding medicines. Do not take aspirin, drink alcohol, or take medicines for pain and arthritis unless your caregiver says it is okay.   Get the suggested follow-up care when the tests are done.  SEEK IMMEDIATE MEDICAL CARE IF:   Your bleeding increases or you become lightheaded, weak, or pass out (faint).   You experience severe cramps in your stomach, back, or belly (abdomen).   You pass large clots.   The problems which brought you in for medical care get worse.  MAKE SURE YOU:   Understand these instructions.   Will watch your condition.   Will get help right away if you are  not doing well or get worse.  Document Released: 06/17/2000 Document Revised: 06/09/2011 Document Reviewed: 05/30/2011 Exit[employer] Patient Information 2012 [employer]. 

## 2011-11-03 ENCOUNTER — Other Ambulatory Visit: Payer: Medicare Other | Admitting: Lab

## 2011-11-03 ENCOUNTER — Other Ambulatory Visit (HOSPITAL_BASED_OUTPATIENT_CLINIC_OR_DEPARTMENT_OTHER): Payer: Medicare Other | Admitting: Lab

## 2011-11-03 ENCOUNTER — Ambulatory Visit (HOSPITAL_BASED_OUTPATIENT_CLINIC_OR_DEPARTMENT_OTHER): Payer: Medicare Other

## 2011-11-03 ENCOUNTER — Ambulatory Visit: Payer: Medicare Other

## 2011-11-03 VITALS — BP 137/72 | HR 74 | Temp 97.0°F

## 2011-11-03 DIAGNOSIS — D631 Anemia in chronic kidney disease: Secondary | ICD-10-CM

## 2011-11-03 DIAGNOSIS — N039 Chronic nephritic syndrome with unspecified morphologic changes: Secondary | ICD-10-CM

## 2011-11-03 DIAGNOSIS — N189 Chronic kidney disease, unspecified: Secondary | ICD-10-CM

## 2011-11-03 LAB — CBC WITH DIFFERENTIAL/PLATELET
BASO%: 0.4 % (ref 0.0–2.0)
MCHC: 33 g/dL (ref 31.5–36.0)
MONO#: 0.7 10*3/uL (ref 0.1–0.9)
RBC: 3.29 10*6/uL — ABNORMAL LOW (ref 3.70–5.45)
RDW: 16 % — ABNORMAL HIGH (ref 11.2–14.5)
WBC: 5.7 10*3/uL (ref 3.9–10.3)
lymph#: 1.9 10*3/uL (ref 0.9–3.3)
nRBC: 0 % (ref 0–0)

## 2011-11-03 MED ORDER — DARBEPOETIN ALFA-POLYSORBATE 200 MCG/0.4ML IJ SOLN
200.0000 ug | Freq: Once | INTRAMUSCULAR | Status: AC
  Administered 2011-11-03: 200 ug via SUBCUTANEOUS
  Filled 2011-11-03: qty 0.4

## 2011-11-16 ENCOUNTER — Encounter: Payer: Self-pay | Admitting: Cardiology

## 2011-11-16 ENCOUNTER — Ambulatory Visit (INDEPENDENT_AMBULATORY_CARE_PROVIDER_SITE_OTHER): Payer: Medicare Other | Admitting: Cardiology

## 2011-11-16 VITALS — BP 152/76 | HR 78 | Ht 63.0 in | Wt 220.0 lb

## 2011-11-16 DIAGNOSIS — I2510 Atherosclerotic heart disease of native coronary artery without angina pectoris: Secondary | ICD-10-CM

## 2011-11-16 DIAGNOSIS — I4891 Unspecified atrial fibrillation: Secondary | ICD-10-CM

## 2011-11-16 DIAGNOSIS — I10 Essential (primary) hypertension: Secondary | ICD-10-CM

## 2011-11-16 DIAGNOSIS — I50810 Right heart failure, unspecified: Secondary | ICD-10-CM

## 2011-11-16 DIAGNOSIS — I509 Heart failure, unspecified: Secondary | ICD-10-CM

## 2011-11-16 NOTE — Patient Instructions (Signed)
Continue your current medication.  I will OK for you to have the banding procedure with Dr. Kinnie Scales.   I will see you again in 3 months.  No salt.

## 2011-11-17 NOTE — Assessment & Plan Note (Signed)
Her volume status has improved. Her weight is down 6 pounds. Her edema is less. We will continue with twice daily Lasix. I stressed the importance of sodium restriction.

## 2011-11-17 NOTE — Assessment & Plan Note (Signed)
2 status post CABG in December of 2010. She has no recurrent anginal symptoms. We will continue with her current medical therapy and risk factor modification.

## 2011-11-17 NOTE — Assessment & Plan Note (Signed)
She has had no documented atrial fibrillation since January. Coumadin was held because of history of lower GI bleeding. This is felt to be a diverticular bleed. She also has large hemorrhoids. I think this would be an opportune time to go ahead and have banding treatment for her hemorrhoids if this is anticipated. She will need to be off of her Coumadin for a couple of months for this. At that point if her hemoglobin has stabilized and she's had no recurrent GI bleeding we would need to decide whether to put her back on Coumadin. For the time being she will stay on aspirin. She is not a candidate for antiarrhythmic drug therapy because of intolerance. She is not a candidate for a fibrillation ablation because of severe tricuspid insufficiency and atrial enlargement.

## 2011-11-17 NOTE — Progress Notes (Signed)
[name] Date of Birth: 09-24-1932 Medical Record #[id number]  History of Present Illness: [name] is seen today for a followup visit. She is actually doing quite well today. She denies any recurrent palpitations or chest pain. She still complains of fatigue. She is taking her Lasix twice a day and has lost 6 pounds. Salt intake is still an issue. She complains of thickening and discoloration of the skin in her legs. She does not walk much because she states her balance is not good. She was admitted to the hospital in April with a lower GI bleed. Her Coumadin was held. He has not had documented recurrence of her atrial fibrillation since January. She is being considered for a banding procedure for hemorrhoids.  Current Outpatient Prescriptions on File Prior to Visit  Medication Sig Dispense Refill  . amitriptyline (ELAVIL) 25 MG tablet Take 75 mg by mouth at bedtime.        Marland Kitchen aspirin 81 MG tablet Take 81 mg by mouth daily.        Marland Kitchen atorvastatin (LIPITOR) 40 MG tablet Take 40 mg by mouth every evening.       . Cholecalciferol (VITAMIN D) 2000 UNITS tablet Take 2,000 Units by mouth daily.        . clobetasol cream (TEMOVATE) 0.05 % Place 1 application vaginally as needed. itching      . Cyanocobalamin (VITAMIN B 12 PO) Take 1 tablet by mouth daily.        . darbepoetin (ARANESP) 200 MCG/0.4ML SOLN Inject 200 mcg into the skin every 30 (thirty) days.      Marland Kitchen donepezil (ARICEPT) 5 MG tablet Take 5 mg by mouth at bedtime.      Marland Kitchen esomeprazole (NEXIUM) 40 MG capsule Take 40 mg by mouth daily before breakfast.        . fish oil-omega-3 fatty acids 1000 MG capsule Take 1 g by mouth daily.       . furosemide (LASIX) 20 MG tablet Take 1 tablet (20 mg total) by mouth 2 (two) times daily. 1 tablet in the AM and 1 tablet in the PM  90 tablet  11  . glimepiride (AMARYL) 4 MG tablet Take 4 mg by mouth 2 (two) times daily.       . insulin glargine (LANTUS) 100 UNIT/ML injection Inject 50 Units into  the skin at bedtime.  10 mL    . insulin lispro (HUMALOG) 100 UNIT/ML injection Inject 28 Units into the skin 3 (three) times daily before meals.       . IRON PO Take 1 tablet by mouth daily.       Marland Kitchen levothyroxine (SYNTHROID, LEVOTHROID) 100 MCG tablet Take 100 mcg by mouth daily.      Marland Kitchen MAGNESIUM PO Take 1 tablet by mouth daily.       . metoprolol (LOPRESSOR) 50 MG tablet Take 0.5 tablets (25 mg total) by mouth 2 (two) times daily.      . vitamin B-12 1000 MCG tablet Take 1 tablet (1,000 mcg total) by mouth daily.      Marland Kitchen DISCONTD: enoxaparin (LOVENOX) 100 MG/ML SOLN Inject into the skin every 12 (twelve) hours.        Marland Kitchen DISCONTD: simvastatin (ZOCOR) 80 MG tablet Take 80 mg by mouth at bedtime.          Allergies  Allergen Reactions  . Betadine (Povidone Iodine) Itching  . Capoten (Captopril) Other (Alexander Comments)    unknown  . Darvocet (  Propoxyphene-Acetaminophen) Other (Alexander Comments)    unknown  . Penicillins Swelling    Past Medical History  Diagnosis Date  . Coronary artery disease     Severe two vessel; s/p CABG  . Hypertension   . Diabetes mellitus     Type 2  . Hyperlipidemia   . Diabetic neuropathy   . PAF (paroxysmal atrial fibrillation)     failed medical therapy with amiodarone; not a candidate for class IC meds due to her CAD. No Multaq due to CHF hx; QT prolonged with amiodarone; Not able to be ablated due to valve disease  . CVA (cerebral vascular accident) 2004  . Hypothyroidism   . Chronic anemia   . Diastolic dysfunction     per echo in May of 2012  . GERD (gastroesophageal reflux disease)   . Arthritis   . Valvular heart disease     has moderate mitral insufficiency with severe tricuspid insufficiency per TEE in November 2012    Past Surgical History  Procedure Date  . Coronary artery bypass graft 06/2009    X3, LIMA to LAD, SVG to diagonal, SVG to the posterior descending artery.  . Tonsillectomy   . Knee arthroscopy   . Transthoracic echocardiogram  11/12/2010     Ejection fraction felt to be around 50%.  Wall thickness was increased in a pattern of mild LVH.  Moderately dilated left atrium.  Mildly dilated right atrium. Right ventricle was mildly dilated with mildly reduced systolic function  . Cardiac catheterization 06/09/2009    inferior wall hypokinesia with ejection fraction of 55%.  . Abdominal hysterectomy   . Joint replacement   . Tee without cardioversion 05/23/2011    Procedure: TRANSESOPHAGEAL ECHOCARDIOGRAM (TEE);  Surgeon: Lewayne Bunting, MD;  Location: [facility] ENDOSCOPY;  Service: Cardiovascular;  Laterality: N/A;    History  Smoking status  . Former Smoker  Smokeless tobacco  . Former Neurosurgeon  . Quit date: 07/04/1972    History  Alcohol Use No    Family History  Problem Relation Age of Onset  . Colon cancer Mother   . Stroke Mother   . Hypertension Mother   . Cancer Father   . Heart attack Father     x2    Review of Systems: The review of systems is positive for chronic edema. This has improved. She denies orthopnea or PND. She has no increase in shortness of breath. No TIA or CVA symptoms.  All other systems were reviewed and are negative.  Physical Exam: BP 152/76  Pulse 78  Ht 5\' 3"  (1.6 m)  Wt 220 lb (99.791 kg)  BMI 38.97 kg/m2 Patient is very pleasant and in no acute distress. She is well dressed. Skin is warm and dry. Color is normal.  HEENT is unremarkable. Normocephalic/atraumatic. PERRL. Sclera are nonicteric. Neck is supple. No masses. No JVD. Lungs are clear. Cardiac exam shows a regular but fast rhythm today. Abdomen is obese but soft. Extremities reveal 1+ edema. Skin in lower extremities is thickened and brawny with hyperpigmented areas. Gait and ROM are intact. No gross neurologic deficits noted.   LABORATORY DATA:  ECG from April showed normal sinus rhythm with a right bundle branch block.  Assessment / Plan:

## 2011-12-01 ENCOUNTER — Other Ambulatory Visit: Payer: Medicare Other | Admitting: Lab

## 2011-12-01 ENCOUNTER — Ambulatory Visit: Payer: Medicare Other

## 2011-12-01 ENCOUNTER — Other Ambulatory Visit (HOSPITAL_BASED_OUTPATIENT_CLINIC_OR_DEPARTMENT_OTHER): Payer: Medicare Other | Admitting: Lab

## 2011-12-01 DIAGNOSIS — N189 Chronic kidney disease, unspecified: Secondary | ICD-10-CM

## 2011-12-01 DIAGNOSIS — N039 Chronic nephritic syndrome with unspecified morphologic changes: Secondary | ICD-10-CM

## 2011-12-01 LAB — CBC WITH DIFFERENTIAL/PLATELET
Basophils Absolute: 0.1 10*3/uL (ref 0.0–0.1)
EOS%: 3 % (ref 0.0–7.0)
Eosinophils Absolute: 0.2 10*3/uL (ref 0.0–0.5)
HGB: 11.6 g/dL (ref 11.6–15.9)
MCH: 30.2 pg (ref 25.1–34.0)
NEUT#: 2.6 10*3/uL (ref 1.5–6.5)
RDW: 14.5 % (ref 11.2–14.5)
WBC: 5.7 10*3/uL (ref 3.9–10.3)
lymph#: 2.2 10*3/uL (ref 0.9–3.3)

## 2011-12-01 MED ORDER — DARBEPOETIN ALFA-POLYSORBATE 200 MCG/0.4ML IJ SOLN: 200.0000 ug | Freq: Once | INTRAMUSCULAR | Status: DC

## 2011-12-27 ENCOUNTER — Encounter: Payer: Self-pay | Admitting: Cardiology

## 2011-12-29 ENCOUNTER — Other Ambulatory Visit: Payer: Medicare Other | Admitting: Lab

## 2011-12-29 ENCOUNTER — Other Ambulatory Visit: Payer: Medicare Other

## 2011-12-29 ENCOUNTER — Ambulatory Visit: Payer: Medicare Other

## 2011-12-29 DIAGNOSIS — N189 Chronic kidney disease, unspecified: Secondary | ICD-10-CM

## 2011-12-29 LAB — CBC WITH DIFFERENTIAL/PLATELET
EOS%: 2.7 % (ref 0.0–7.0)
Eosinophils Absolute: 0.2 10*3/uL (ref 0.0–0.5)
MCH: 29.7 pg (ref 25.1–34.0)
MCV: 90.2 fL (ref 79.5–101.0)
MONO%: 14.5 % — ABNORMAL HIGH (ref 0.0–14.0)
NEUT#: 3.3 10*3/uL (ref 1.5–6.5)
RBC: 3.87 10*6/uL (ref 3.70–5.45)
RDW: 14.1 % (ref 11.2–14.5)
lymph#: 2.1 10*3/uL (ref 0.9–3.3)

## 2011-12-29 MED ORDER — DARBEPOETIN ALFA-POLYSORBATE 200 MCG/0.4ML IJ SOLN: 200.0000 ug | Freq: Once | INTRAMUSCULAR | Status: DC

## 2012-01-26 ENCOUNTER — Other Ambulatory Visit: Payer: Medicare Other | Admitting: Lab

## 2012-01-26 ENCOUNTER — Other Ambulatory Visit (HOSPITAL_BASED_OUTPATIENT_CLINIC_OR_DEPARTMENT_OTHER): Payer: Medicare Other | Admitting: Lab

## 2012-01-26 ENCOUNTER — Ambulatory Visit: Payer: Medicare Other

## 2012-01-26 DIAGNOSIS — D631 Anemia in chronic kidney disease: Secondary | ICD-10-CM

## 2012-01-26 DIAGNOSIS — N039 Chronic nephritic syndrome with unspecified morphologic changes: Secondary | ICD-10-CM

## 2012-01-26 DIAGNOSIS — N189 Chronic kidney disease, unspecified: Secondary | ICD-10-CM

## 2012-01-26 LAB — CBC WITH DIFFERENTIAL/PLATELET
Eosinophils Absolute: 0.2 10*3/uL (ref 0.0–0.5)
MONO#: 0.7 10*3/uL (ref 0.1–0.9)
NEUT#: 2.9 10*3/uL (ref 1.5–6.5)
RBC: 3.85 10*6/uL (ref 3.70–5.45)
RDW: 14 % (ref 11.2–14.5)
WBC: 6 10*3/uL (ref 3.9–10.3)
lymph#: 2.1 10*3/uL (ref 0.9–3.3)
nRBC: 0 % (ref 0–0)

## 2012-01-26 MED ORDER — DARBEPOETIN ALFA-POLYSORBATE 200 MCG/0.4ML IJ SOLN: 200.0000 ug | Freq: Once | INTRAMUSCULAR | Status: DC

## 2012-02-08 ENCOUNTER — Ambulatory Visit: Payer: Medicare Other | Admitting: Cardiology

## 2012-02-23 ENCOUNTER — Other Ambulatory Visit (HOSPITAL_BASED_OUTPATIENT_CLINIC_OR_DEPARTMENT_OTHER): Payer: Medicare Other

## 2012-02-23 ENCOUNTER — Telehealth: Payer: Self-pay | Admitting: Oncology

## 2012-02-23 ENCOUNTER — Encounter: Payer: Self-pay | Admitting: Oncology

## 2012-02-23 ENCOUNTER — Ambulatory Visit: Payer: Medicare Other | Admitting: Oncology

## 2012-02-23 ENCOUNTER — Ambulatory Visit (HOSPITAL_BASED_OUTPATIENT_CLINIC_OR_DEPARTMENT_OTHER): Payer: Medicare Other | Admitting: Oncology

## 2012-02-23 ENCOUNTER — Other Ambulatory Visit: Payer: Medicare Other | Admitting: Lab

## 2012-02-23 VITALS — BP 137/67 | HR 81 | Temp 97.5°F | Resp 20 | Ht 63.0 in | Wt 222.0 lb

## 2012-02-23 DIAGNOSIS — N189 Chronic kidney disease, unspecified: Secondary | ICD-10-CM

## 2012-02-23 DIAGNOSIS — D631 Anemia in chronic kidney disease: Secondary | ICD-10-CM

## 2012-02-23 DIAGNOSIS — D472 Monoclonal gammopathy: Secondary | ICD-10-CM

## 2012-02-23 DIAGNOSIS — D539 Nutritional anemia, unspecified: Secondary | ICD-10-CM

## 2012-02-23 LAB — CBC WITH DIFFERENTIAL/PLATELET
BASO%: 0.5 % (ref 0.0–2.0)
Basophils Absolute: 0 10*3/uL (ref 0.0–0.1)
Eosinophils Absolute: 0.2 10*3/uL (ref 0.0–0.5)
HCT: 32.7 % — ABNORMAL LOW (ref 34.8–46.6)
HGB: 10.7 g/dL — ABNORMAL LOW (ref 11.6–15.9)
LYMPH%: 33.2 % (ref 14.0–49.7)
MONO#: 0.8 10*3/uL (ref 0.1–0.9)
NEUT#: 3.1 10*3/uL (ref 1.5–6.5)
NEUT%: 50.6 % (ref 38.4–76.8)
Platelets: 222 10*3/uL (ref 145–400)
WBC: 6.1 10*3/uL (ref 3.9–10.3)
lymph#: 2 10*3/uL (ref 0.9–3.3)

## 2012-02-23 MED ORDER — DARBEPOETIN ALFA-POLYSORBATE 200 MCG/0.4ML IJ SOLN
200.0000 ug | Freq: Once | INTRAMUSCULAR | Status: AC
  Administered 2012-02-23: 200 ug via SUBCUTANEOUS
  Filled 2012-02-23: qty 0.4

## 2012-02-23 NOTE — Telephone Encounter (Signed)
appts made and printed for pt aom °

## 2012-02-23 NOTE — Progress Notes (Signed)
CC:   [name], M.D. [name], MD  PROBLEM LIST:  1. Anemia felt to be secondary to chronic renal disease dating back to  January 2003. This patient also has a history of iron-deficiency  anemia as well as vitamin B12 deficiency. She has received  intravenous iron in October 2004 and September of 2008. Currently  she is on oral vitamin B12.  2. IgG lambda monoclonal gammopathy of uncertain significance dating  back to November 2004.  3. Angiodysplasia with history of Hemoccult-positive stools.  4. History of elevated ANA 1:160, atypical speckled dating back to  November 2005.  5. Coronary artery disease status post CABG by Dr. Evelene Croon on  June 11, 2009.  6. Tricuspid insufficiency by TEE in October 2012.  7. History of atrial fibrillation/flutter.  8. Systolic ejection murmur.  9. Renal insufficiency.  10. History of syncope late October 2012.  11. History of acute right pontine stroke 04/09/2003.  12. Diabetes mellitus.  13. Hypertension.  14. Dyslipidemia.  15. Hypothyroidism.  16. Gastroesophageal reflux disease.  17. Osteoarthritis.  18. Right total knee replacement 12/03/2003.  19. Abdominal hernia. 20. Diverticulosis. 21. Admission for GI bleeding felt to be due to diverticulosis in April     2013. 22. Hiatal hernia.  MEDICATIONS:  1. Elavil 75 mg h.s.  2. Aspirin 81 mg daily.  3. Lipitor 40 mg daily.  4. Vitamin D 2000 units daily.  5. Vitamin B12 1000 mcg daily.  6. Aranesp 200 mcg subcu every 4 weeks for hemoglobin less than or  equal to 11.  7. Aricept 5 mg at bedtime.  8. Nexium 40 mg daily.  9. Omega-3 fish oil 1000 mg daily.  10.Lasix 20 mg twice a day.  11.Amaryl 4 mg twice daily.  12.Hydrocodone syrup 5 mg per 5 mL, take 1.5 mL daily as needed for  cough.  13.Lantus insulin 60 units at bedtime.  14.Lispro insulin 28 units 3 times a day before meals.  15.Iron daily.  16.Synthroid 100 mcg daily.  17.Magnesium daily.  18.Lopressor  25 mg twice daily.  19.Coumadin was apparently discontinued after the April 2013 hospital admission for GI bleeding.      HISTORY:  I saw [name] today for followup of anemia, which is felt to be secondary to chronic renal disease.  The patient has a multitude of medical problems.  Apparently she developed a GI bleed secondary to diverticulosis, was admitted in April 2013.  She underwent upper endoscopy and colonoscopy at that time by Dr. Elnoria Howard.  The patient is back at her baseline.  She denies any evidence of blood in her stools or black stools.  In looking over her chart, she has not received any Aranesp since May 2nd when her hemoglobin was 9.9.  She has been receiving Aranesp 200 mcg subcu every 4 weeks whenever her hemoglobin is less than or equal to 11.  The patient did receive Aranesp at the time of her last visit on February 7th, also March 7th, April 4th and May 2nd.  The patient appears to be at her baseline.  PHYSICAL EXAMINATION:  There is little change.  Weight is 222 pounds, height 5 feet 3 inches, body surface area 2.12 sq m.  Blood pressure 137/67.  Other vital signs are normal.  Pulse is 81 and appears to be regular.  There is no scleral icterus.  Mouth and pharynx are benign. There is no peripheral adenopathy palpable.  Lungs:  Clear to percussion and auscultation.  Cardiac:  Regular rhythm with systolic ejection murmur.  Abdomen with the patient sitting is obese, nontender, with no organomegaly or masses palpable.  An umbilical hernia has been noted in the past measuring about 6-8 cm.  She has chronic swelling of her legs with stasis changes.  Legs are tight and nonpitting.  Neurologic exam is grossly normal.  The patient has short-term memory loss and is on Aricept.  LABORATORY DATA:  Iron studies, vitamin B12 level and quantitative immunoglobulins are pending.  Chemistries from 10/25/2011 notable for BUN of 15, creatinine 1.52, estimated GFR of 37, glucose  309, albumin 2.9.  Most recent vitamin B12 level was 1284 on July 14, 2011,  Also on that date ferritin was 132, iron saturation 19%.  IgG level was 1980, which is slightly increased in this patient with an IgG monoclonal gammopathy.  IgA level was 345 and IgM 165.  IMAGING STUDIES:  1. Two-view chest x-ray on 04/06/2011 showed cardiomegaly, small  effusions and questionable mild congestion.  2. CT scan of the head without IV contrast on 04/06/2011 showed  atrophy and chronic ischemic Johannes matter disease without acute  intracranial abnormality. There was unchanged small left corona  radiata lacunar infarct.   PROCEDURES:  The patient underwent upper endoscopy and colonoscopy on 10/24/2011.  She had a 5 cm hiatal hernia, also diverticulosis, but no active bleeding was noted.  IMPRESSION AND PLAN:  At the present time, [name] seems to be doing okay.  Apparently Coumadin was discontinued in April following her gastrointestinal bleed.  The patient's hemoglobin today is 10.7.  She will receive Aranesp 200 mcg subcu.  She will continue to come back every 4 weeks for CBC and receive Aranesp 200 mcg subcu for hemoglobin less than or equal to 11.  We will plan to see [name] again in approximately 6 months, which should be early February.  At that time we will check CBC, chemistries, iron studies, vitamin B12 level and quantitative immunoglobulins.    ______________________________ Samul Dada, M.D. DSM/MEDQ  D:  02/23/2012  T:  02/23/2012  Job:  [id number]

## 2012-02-23 NOTE — Progress Notes (Signed)
This office note has been dictated.  #[id number]

## 2012-02-24 ENCOUNTER — Other Ambulatory Visit: Payer: Self-pay | Admitting: Oncology

## 2012-02-24 DIAGNOSIS — D631 Anemia in chronic kidney disease: Secondary | ICD-10-CM

## 2012-02-24 LAB — COMPREHENSIVE METABOLIC PANEL
AST: 29 U/L (ref 0–37)
Albumin: 3.7 g/dL (ref 3.5–5.2)
BUN: 22 mg/dL (ref 6–23)
Calcium: 9.2 mg/dL (ref 8.4–10.5)
Chloride: 102 mEq/L (ref 96–112)
Creatinine, Ser: 1.53 mg/dL — ABNORMAL HIGH (ref 0.50–1.10)
Glucose, Bld: 110 mg/dL — ABNORMAL HIGH (ref 70–99)

## 2012-02-24 LAB — VITAMIN B12: Vitamin B-12: >2000 pg/mL — ABNORMAL HIGH (ref 211–911)

## 2012-02-24 LAB — IRON AND TIBC
Iron: 90 ug/dL (ref 42–145)
UIBC: 229 ug/dL (ref 125–400)

## 2012-02-24 LAB — FERRITIN: Ferritin: 77 ng/mL (ref 10–291)

## 2012-02-24 LAB — LACTATE DEHYDROGENASE: LDH: 181 U/L (ref 94–250)

## 2012-02-24 LAB — IGG, IGA, IGM: IgM, Serum: 160 mg/dL (ref 52–322)

## 2012-03-07 ENCOUNTER — Encounter: Payer: Self-pay | Admitting: Cardiology

## 2012-03-07 ENCOUNTER — Ambulatory Visit (INDEPENDENT_AMBULATORY_CARE_PROVIDER_SITE_OTHER): Payer: Medicare Other | Admitting: Cardiology

## 2012-03-07 VITALS — BP 144/64 | HR 87 | Ht 63.0 in | Wt 219.1 lb

## 2012-03-07 DIAGNOSIS — I25810 Atherosclerosis of coronary artery bypass graft(s) without angina pectoris: Secondary | ICD-10-CM

## 2012-03-07 DIAGNOSIS — I4891 Unspecified atrial fibrillation: Secondary | ICD-10-CM

## 2012-03-07 DIAGNOSIS — Z951 Presence of aortocoronary bypass graft: Secondary | ICD-10-CM

## 2012-03-07 NOTE — Progress Notes (Signed)
[name] Date of Birth: [birth year] Medical Record #[id number]  History of Present Illness: [name] is seen today for a followup visit. She is doing quite well. Her major complaint today is of cramps in her legs at night. She has tried a number of remedies including tonic water, mustard, and a bar of soap in her bed without relief. She denies any palpitations or tachycardia. She's had no chest pain or shortness of breath. She has had no increase in ankle edema.  Current Outpatient Prescriptions on File Prior to Visit  Medication Sig Dispense Refill  . amitriptyline (ELAVIL) 25 MG tablet Take 75 mg by mouth at bedtime.        Marland Kitchen aspirin 81 MG tablet Take 81 mg by mouth daily.        Marland Kitchen atorvastatin (LIPITOR) 40 MG tablet Take 40 mg by mouth every evening.       . Cholecalciferol (VITAMIN D) 2000 UNITS tablet Take 2,000 Units by mouth daily.        . clobetasol cream (TEMOVATE) 0.05 % Place 1 application vaginally as needed. itching      . darbepoetin (ARANESP) 200 MCG/0.4ML SOLN Inject 200 mcg into the skin every 30 (thirty) days.      Marland Kitchen donepezil (ARICEPT) 5 MG tablet Take 5 mg by mouth at bedtime.      Marland Kitchen esomeprazole (NEXIUM) 40 MG capsule Take 40 mg by mouth daily before breakfast.        . fish oil-omega-3 fatty acids 1000 MG capsule Take 1 g by mouth daily.       . furosemide (LASIX) 20 MG tablet Take 1 tablet (20 mg total) by mouth 2 (two) times daily. 1 tablet in the AM and 1 tablet in the PM  90 tablet  11  . glimepiride (AMARYL) 4 MG tablet Take 4 mg by mouth 2 (two) times daily.       . insulin glargine (LANTUS) 100 UNIT/ML injection Inject 60 Units into the skin 3 (three) times daily.      . insulin lispro (HUMALOG) 100 UNIT/ML injection Inject 50 Units into the skin 3 (three) times daily before meals.      . IRON PO Take 1 tablet by mouth daily.       Marland Kitchen levothyroxine (SYNTHROID, LEVOTHROID) 100 MCG tablet Take 100 mcg by mouth daily.      Marland Kitchen MAGNESIUM PO Take 1 tablet by  mouth daily.       . metoprolol (LOPRESSOR) 50 MG tablet Take 0.5 tablets (25 mg total) by mouth 2 (two) times daily.      . vitamin B-12 1000 MCG tablet Take 1 tablet (1,000 mcg total) by mouth daily.      Marland Kitchen DISCONTD: enoxaparin (LOVENOX) 100 MG/ML SOLN Inject into the skin every 12 (twelve) hours.        Marland Kitchen DISCONTD: simvastatin (ZOCOR) 80 MG tablet Take 80 mg by mouth at bedtime.          Allergies  Allergen Reactions  . Betadine (Povidone Iodine) Itching  . Capoten (Captopril) Other (Alexander Comments)    unknown  . Darvocet (Propoxyphene-Acetaminophen) Other (Alexander Comments)    unknown  . Penicillins Swelling    Past Medical History  Diagnosis Date  . Coronary artery disease     Severe two vessel; s/p CABG  . Hypertension   . Diabetes mellitus     Type 2  . Hyperlipidemia   . Diabetic neuropathy   . PAF (  paroxysmal atrial fibrillation)     failed medical therapy with amiodarone; not a candidate for class IC meds due to her CAD. No Multaq due to CHF hx; QT prolonged with amiodarone; Not able to be ablated due to valve disease  . CVA (cerebral vascular accident) 2004  . Hypothyroidism   . Chronic anemia   . Diastolic dysfunction     per echo in May of 2012  . GERD (gastroesophageal reflux disease)   . Arthritis   . Valvular heart disease     has moderate mitral insufficiency with severe tricuspid insufficiency per TEE in November 2012    Past Surgical History  Procedure Date  . Coronary artery bypass graft 06/2009    X3, LIMA to LAD, SVG to diagonal, SVG to the posterior descending artery.  . Tonsillectomy   . Knee arthroscopy   . Transthoracic echocardiogram 11/12/2010     Ejection fraction felt to be around 50%.  Wall thickness was increased in a pattern of mild LVH.  Moderately dilated left atrium.  Mildly dilated right atrium. Right ventricle was mildly dilated with mildly reduced systolic function  . Cardiac catheterization 06/09/2009    inferior wall hypokinesia with  ejection fraction of 55%.  . Abdominal hysterectomy   . Joint replacement   . Tee without cardioversion 05/23/2011    Procedure: TRANSESOPHAGEAL ECHOCARDIOGRAM (TEE);  Surgeon: Lewayne Bunting, MD;  Location: [facility] ENDOSCOPY;  Service: Cardiovascular;  Laterality: N/A;    History  Smoking status  . Former Smoker  Smokeless tobacco  . Former Neurosurgeon  . Quit date: 07/04/1972    History  Alcohol Use No    Family History  Problem Relation Age of Onset  . Colon cancer Mother   . Stroke Mother   . Hypertension Mother   . Cancer Father   . Heart attack Father     x2    Review of Systems: The review of systems is positive for chronic edema.  She denies orthopnea or PND.   All other systems were reviewed and are negative.  Physical Exam: BP 144/64  Pulse 87  Ht 5\' 3"  (1.6 m)  Wt 99.392 kg (219 lb 1.9 oz)  BMI 38.82 kg/m2  SpO2 95% Patient is very pleasant and in no acute distress. She is well dressed. Skin is warm and dry. Color is normal.  HEENT is unremarkable. Normocephalic/atraumatic. PERRL. Sclera are nonicteric. Neck is supple. No masses. No JVD. Lungs are clear. Cardiac exam shows a regular but fast rhythm today. Abdomen is obese but soft. Extremities reveal 1+ edema. Skin in lower extremities is thickened and brawny with hyperpigmentation. Gait and ROM are intact. No gross neurologic deficits noted.   LABORATORY DATA:    Assessment / Plan: 1. Coronary disease status post CABG in December of 2010. She is asymptomatic. Continue risk factor modification with aspirin, statin therapy, and metoprolol.  2. History of atrial fibrillation. No evidence of recurrence. Last episode in January of 2013. She has been intolerant to multiple medication. Fortunately her arrhythmia has improved.  3. Hypertension, controlled.  4. History of right heart failure. Her weight and edema are stable. Continue current diuretic dose.

## 2012-03-07 NOTE — Patient Instructions (Signed)
Continue your current therapy  I will see you again in 6 months.   

## 2012-03-22 ENCOUNTER — Ambulatory Visit (HOSPITAL_BASED_OUTPATIENT_CLINIC_OR_DEPARTMENT_OTHER): Payer: Medicare Other

## 2012-03-22 ENCOUNTER — Other Ambulatory Visit (HOSPITAL_BASED_OUTPATIENT_CLINIC_OR_DEPARTMENT_OTHER): Payer: Medicare Other | Admitting: Lab

## 2012-03-22 VITALS — BP 148/75 | HR 83 | Temp 97.1°F

## 2012-03-22 DIAGNOSIS — N189 Chronic kidney disease, unspecified: Secondary | ICD-10-CM

## 2012-03-22 DIAGNOSIS — N039 Chronic nephritic syndrome with unspecified morphologic changes: Secondary | ICD-10-CM

## 2012-03-22 DIAGNOSIS — D631 Anemia in chronic kidney disease: Secondary | ICD-10-CM

## 2012-03-22 LAB — CBC WITH DIFFERENTIAL/PLATELET
Basophils Absolute: 0 10*3/uL (ref 0.0–0.1)
Eosinophils Absolute: 0.1 10*3/uL (ref 0.0–0.5)
HCT: 32.8 % — ABNORMAL LOW (ref 34.8–46.6)
HGB: 10.9 g/dL — ABNORMAL LOW (ref 11.6–15.9)
LYMPH%: 26.5 % (ref 14.0–49.7)
MCV: 91.4 fL (ref 79.5–101.0)
MONO#: 0.8 10*3/uL (ref 0.1–0.9)
MONO%: 10.8 % (ref 0.0–14.0)
NEUT#: 4.5 10*3/uL (ref 1.5–6.5)
Platelets: 222 10*3/uL (ref 145–400)
WBC: 7.4 10*3/uL (ref 3.9–10.3)

## 2012-03-22 MED ORDER — DARBEPOETIN ALFA-POLYSORBATE 200 MCG/0.4ML IJ SOLN
200.0000 ug | Freq: Once | INTRAMUSCULAR | Status: AC
  Administered 2012-03-22: 200 ug via SUBCUTANEOUS
  Filled 2012-03-22: qty 0.4

## 2012-04-19 ENCOUNTER — Telehealth: Payer: Self-pay | Admitting: Oncology

## 2012-04-19 ENCOUNTER — Ambulatory Visit: Payer: Medicare Other

## 2012-04-19 ENCOUNTER — Other Ambulatory Visit: Payer: Medicare Other | Admitting: Lab

## 2012-04-19 NOTE — Telephone Encounter (Signed)
s/w pt and she wishes to r/s her 10/17 lab and inj to 10/18,done,[place]

## 2012-04-20 ENCOUNTER — Ambulatory Visit: Payer: Medicare Other

## 2012-04-20 ENCOUNTER — Other Ambulatory Visit (HOSPITAL_BASED_OUTPATIENT_CLINIC_OR_DEPARTMENT_OTHER): Payer: Medicare Other | Admitting: Lab

## 2012-04-20 DIAGNOSIS — D631 Anemia in chronic kidney disease: Secondary | ICD-10-CM

## 2012-04-20 DIAGNOSIS — N189 Chronic kidney disease, unspecified: Secondary | ICD-10-CM

## 2012-04-20 DIAGNOSIS — D472 Monoclonal gammopathy: Secondary | ICD-10-CM

## 2012-04-20 DIAGNOSIS — N039 Chronic nephritic syndrome with unspecified morphologic changes: Secondary | ICD-10-CM

## 2012-04-20 LAB — CBC WITH DIFFERENTIAL/PLATELET
BASO%: 0.7 % (ref 0.0–2.0)
Basophils Absolute: 0.1 10*3/uL (ref 0.0–0.1)
HCT: 34.9 % (ref 34.8–46.6)
HGB: 11.5 g/dL — ABNORMAL LOW (ref 11.6–15.9)
LYMPH%: 30.4 % (ref 14.0–49.7)
MCH: 30.5 pg (ref 25.1–34.0)
MCHC: 33 g/dL (ref 31.5–36.0)
MONO#: 0.8 10*3/uL (ref 0.1–0.9)
NEUT%: 55 % (ref 38.4–76.8)
Platelets: 219 10*3/uL (ref 145–400)
WBC: 6.7 10*3/uL (ref 3.9–10.3)

## 2012-04-20 MED ORDER — DARBEPOETIN ALFA-POLYSORBATE 200 MCG/0.4ML IJ SOLN: 200.0000 ug | Freq: Once | INTRAMUSCULAR | Status: DC

## 2012-04-24 ENCOUNTER — Telehealth: Payer: Self-pay

## 2012-04-24 ENCOUNTER — Other Ambulatory Visit: Payer: Self-pay | Admitting: Oncology

## 2012-04-24 ENCOUNTER — Encounter: Payer: Self-pay | Admitting: Oncology

## 2012-04-24 NOTE — Telephone Encounter (Signed)
S/w husband that pt will need feraheme appt for ferritin of 83 on 04/20/12. Expect a call from scheduler. 115 on 05/04/12 per Marcelino Duster. LVM on cell phone

## 2012-04-24 NOTE — Progress Notes (Signed)
Ferritin from 04/20/12 was 83, down from 110 on 03/22/12.  Will give 1 dose of IV Feraheme to optimize Aranesp.  Feraheme scheduled for 05/04/12.

## 2012-04-25 ENCOUNTER — Telehealth: Payer: Self-pay | Admitting: *Deleted

## 2012-04-25 NOTE — Telephone Encounter (Signed)
Made patient appointment for 05-04-2012 starting at 2:00pm  Sent michelle an email to set up patient's iv iron ON 05-04-2012

## 2012-04-25 NOTE — Telephone Encounter (Signed)
Per staff message and POF I have scheduled aappt. JMW

## 2012-05-01 ENCOUNTER — Encounter (HOSPITAL_COMMUNITY): Payer: Self-pay | Admitting: *Deleted

## 2012-05-01 ENCOUNTER — Inpatient Hospital Stay (HOSPITAL_COMMUNITY)
Admission: EM | Admit: 2012-05-01 | Discharge: 2012-05-11 | DRG: 603 | Disposition: A | Discharge status: Home Health | Payer: Medicare Other | Attending: Internal Medicine | Admitting: Internal Medicine

## 2012-05-01 ENCOUNTER — Telehealth (INDEPENDENT_AMBULATORY_CARE_PROVIDER_SITE_OTHER): Payer: Self-pay

## 2012-05-01 DIAGNOSIS — M1710 Unilateral primary osteoarthritis, unspecified knee: Secondary | ICD-10-CM | POA: Diagnosis present

## 2012-05-01 DIAGNOSIS — L02219 Cutaneous abscess of trunk, unspecified: Secondary | ICD-10-CM

## 2012-05-01 DIAGNOSIS — D631 Anemia in chronic kidney disease: Secondary | ICD-10-CM | POA: Diagnosis present

## 2012-05-01 DIAGNOSIS — E538 Deficiency of other specified B group vitamins: Secondary | ICD-10-CM | POA: Diagnosis present

## 2012-05-01 DIAGNOSIS — L03319 Cellulitis of trunk, unspecified: Principal | ICD-10-CM | POA: Diagnosis present

## 2012-05-01 DIAGNOSIS — I519 Heart disease, unspecified: Secondary | ICD-10-CM | POA: Diagnosis present

## 2012-05-01 DIAGNOSIS — E559 Vitamin D deficiency, unspecified: Secondary | ICD-10-CM | POA: Diagnosis present

## 2012-05-01 DIAGNOSIS — I129 Hypertensive chronic kidney disease with stage 1 through stage 4 chronic kidney disease, or unspecified chronic kidney disease: Secondary | ICD-10-CM | POA: Diagnosis present

## 2012-05-01 DIAGNOSIS — I428 Other cardiomyopathies: Secondary | ICD-10-CM | POA: Diagnosis present

## 2012-05-01 DIAGNOSIS — N183 Chronic kidney disease, stage 3 unspecified: Secondary | ICD-10-CM | POA: Diagnosis present

## 2012-05-01 DIAGNOSIS — L0390 Cellulitis, unspecified: Secondary | ICD-10-CM

## 2012-05-01 DIAGNOSIS — I96 Gangrene, not elsewhere classified: Secondary | ICD-10-CM | POA: Diagnosis present

## 2012-05-01 DIAGNOSIS — N039 Chronic nephritic syndrome with unspecified morphologic changes: Secondary | ICD-10-CM | POA: Diagnosis present

## 2012-05-01 DIAGNOSIS — K219 Gastro-esophageal reflux disease without esophagitis: Secondary | ICD-10-CM | POA: Diagnosis present

## 2012-05-01 DIAGNOSIS — Z87891 Personal history of nicotine dependence: Secondary | ICD-10-CM

## 2012-05-01 DIAGNOSIS — I872 Venous insufficiency (chronic) (peripheral): Secondary | ICD-10-CM | POA: Diagnosis present

## 2012-05-01 DIAGNOSIS — E039 Hypothyroidism, unspecified: Secondary | ICD-10-CM | POA: Diagnosis present

## 2012-05-01 DIAGNOSIS — Z8673 Personal history of transient ischemic attack (TIA), and cerebral infarction without residual deficits: Secondary | ICD-10-CM

## 2012-05-01 DIAGNOSIS — Z6841 Body Mass Index (BMI) 40.0 and over, adult: Secondary | ICD-10-CM

## 2012-05-01 DIAGNOSIS — E1139 Type 2 diabetes mellitus with other diabetic ophthalmic complication: Secondary | ICD-10-CM | POA: Diagnosis present

## 2012-05-01 DIAGNOSIS — Z23 Encounter for immunization: Secondary | ICD-10-CM

## 2012-05-01 DIAGNOSIS — D72829 Elevated white blood cell count, unspecified: Secondary | ICD-10-CM | POA: Diagnosis present

## 2012-05-01 DIAGNOSIS — Z951 Presence of aortocoronary bypass graft: Secondary | ICD-10-CM

## 2012-05-01 DIAGNOSIS — Z7901 Long term (current) use of anticoagulants: Secondary | ICD-10-CM

## 2012-05-01 DIAGNOSIS — E11329 Type 2 diabetes mellitus with mild nonproliferative diabetic retinopathy without macular edema: Secondary | ICD-10-CM | POA: Diagnosis present

## 2012-05-01 DIAGNOSIS — G3184 Mild cognitive impairment, so stated: Secondary | ICD-10-CM | POA: Diagnosis present

## 2012-05-01 DIAGNOSIS — L02415 Cutaneous abscess of right lower limb: Secondary | ICD-10-CM | POA: Diagnosis present

## 2012-05-01 DIAGNOSIS — I252 Old myocardial infarction: Secondary | ICD-10-CM

## 2012-05-01 DIAGNOSIS — E1149 Type 2 diabetes mellitus with other diabetic neurological complication: Secondary | ICD-10-CM | POA: Diagnosis present

## 2012-05-01 DIAGNOSIS — D472 Monoclonal gammopathy: Secondary | ICD-10-CM | POA: Diagnosis present

## 2012-05-01 DIAGNOSIS — I059 Rheumatic mitral valve disease, unspecified: Secondary | ICD-10-CM | POA: Diagnosis present

## 2012-05-01 DIAGNOSIS — E1142 Type 2 diabetes mellitus with diabetic polyneuropathy: Secondary | ICD-10-CM | POA: Diagnosis present

## 2012-05-01 DIAGNOSIS — E785 Hyperlipidemia, unspecified: Secondary | ICD-10-CM | POA: Diagnosis present

## 2012-05-01 DIAGNOSIS — I2510 Atherosclerotic heart disease of native coronary artery without angina pectoris: Secondary | ICD-10-CM | POA: Diagnosis present

## 2012-05-01 DIAGNOSIS — I079 Rheumatic tricuspid valve disease, unspecified: Secondary | ICD-10-CM | POA: Diagnosis present

## 2012-05-01 HISTORY — DX: Other amnesia: R41.3

## 2012-05-01 LAB — CBC WITH DIFFERENTIAL/PLATELET
Basophils Relative: 0 % (ref 0–1)
HCT: 34.9 % — ABNORMAL LOW (ref 36.0–46.0)
Hemoglobin: 11.4 g/dL — ABNORMAL LOW (ref 12.0–15.0)
Lymphocytes Relative: 13 % (ref 12–46)
MCHC: 32.7 g/dL (ref 30.0–36.0)
Monocytes Absolute: 1.8 10*3/uL — ABNORMAL HIGH (ref 0.1–1.0)
Monocytes Relative: 13 % — ABNORMAL HIGH (ref 3–12)
Neutro Abs: 9.8 10*3/uL — ABNORMAL HIGH (ref 1.7–7.7)

## 2012-05-01 LAB — COMPREHENSIVE METABOLIC PANEL
BUN: 20 mg/dL (ref 6–23)
CO2: 28 mEq/L (ref 19–32)
Chloride: 95 mEq/L — ABNORMAL LOW (ref 96–112)
Creatinine, Ser: 1.52 mg/dL — ABNORMAL HIGH (ref 0.50–1.10)
GFR calc Af Amer: 37 mL/min — ABNORMAL LOW (ref >90)
GFR calc non Af Amer: 32 mL/min — ABNORMAL LOW (ref >90)
Glucose, Bld: 345 mg/dL — ABNORMAL HIGH (ref 70–99)
Total Bilirubin: 0.8 mg/dL (ref 0.3–1.2)

## 2012-05-01 LAB — LACTIC ACID, PLASMA: Lactic Acid, Venous: 1.4 mmol/L (ref 0.5–2.2)

## 2012-05-01 MED ORDER — ONDANSETRON HCL 4 MG/2ML IJ SOLN: 4.0000 mg | Freq: Three times a day (TID) | INTRAMUSCULAR | Status: AC | PRN

## 2012-05-01 MED ORDER — AMITRIPTYLINE HCL 75 MG PO TABS
75.0000 mg | ORAL_TABLET | Freq: Every day | ORAL | Status: DC
  Administered 2012-05-01 – 2012-05-10 (×10): 75 mg via ORAL
  Filled 2012-05-01 (×11): qty 1

## 2012-05-01 MED ORDER — METOPROLOL TARTRATE 25 MG PO TABS: 25.0000 mg | ORAL_TABLET | Freq: Two times a day (BID) | ORAL | Status: DC

## 2012-05-01 MED ORDER — GLIMEPIRIDE 4 MG PO TABS
4.0000 mg | ORAL_TABLET | Freq: Two times a day (BID) | ORAL | Status: DC
  Administered 2012-05-01 – 2012-05-07 (×12): 4 mg via ORAL
  Filled 2012-05-01 (×13): qty 1

## 2012-05-01 MED ORDER — LEVOTHYROXINE SODIUM 100 MCG PO TABS
100.0000 ug | ORAL_TABLET | Freq: Every day | ORAL | Status: DC
  Administered 2012-05-02 – 2012-05-11 (×10): 100 ug via ORAL
  Filled 2012-05-01 (×12): qty 1

## 2012-05-01 MED ORDER — HYDROMORPHONE HCL PF 1 MG/ML IJ SOLN: 1.0000 mg | INTRAMUSCULAR | Status: AC | PRN

## 2012-05-01 MED ORDER — INSULIN ASPART 100 UNIT/ML ~~LOC~~ SOLN
0.0000 [IU] | Freq: Three times a day (TID) | SUBCUTANEOUS | Status: DC
  Administered 2012-05-02: 5 [IU] via SUBCUTANEOUS
  Administered 2012-05-02: 3 [IU] via SUBCUTANEOUS
  Administered 2012-05-02: 8 [IU] via SUBCUTANEOUS
  Administered 2012-05-03: 2 [IU] via SUBCUTANEOUS
  Administered 2012-05-03: 5 [IU] via SUBCUTANEOUS
  Administered 2012-05-04: 3 [IU] via SUBCUTANEOUS
  Administered 2012-05-05: 2 [IU] via SUBCUTANEOUS
  Administered 2012-05-05 – 2012-05-06 (×2): 3 [IU] via SUBCUTANEOUS
  Administered 2012-05-08: 5 [IU] via SUBCUTANEOUS
  Administered 2012-05-08 – 2012-05-09 (×4): 3 [IU] via SUBCUTANEOUS
  Administered 2012-05-10: 2 [IU] via SUBCUTANEOUS
  Administered 2012-05-11: 3 [IU] via SUBCUTANEOUS

## 2012-05-01 MED ORDER — LEVOTHYROXINE SODIUM 100 MCG PO TABS: 100.0000 ug | ORAL_TABLET | Freq: Every day | ORAL | Status: DC

## 2012-05-01 MED ORDER — VITAMIN B-12 1000 MCG PO TABS
1000.0000 ug | ORAL_TABLET | Freq: Every day | ORAL | Status: DC
  Administered 2012-05-01 – 2012-05-11 (×11): 1000 ug via ORAL
  Filled 2012-05-01 (×11): qty 1

## 2012-05-01 MED ORDER — CLINDAMYCIN PHOSPHATE 900 MG/50ML IV SOLN
900.0000 mg | Freq: Three times a day (TID) | INTRAVENOUS | Status: DC
  Administered 2012-05-01 – 2012-05-07 (×17): 900 mg via INTRAVENOUS
  Filled 2012-05-01 (×18): qty 50

## 2012-05-01 MED ORDER — INSULIN ASPART 100 UNIT/ML ~~LOC~~ SOLN
0.0000 [IU] | Freq: Every day | SUBCUTANEOUS | Status: DC
  Administered 2012-05-01: 3 [IU] via SUBCUTANEOUS
  Administered 2012-05-02 – 2012-05-08 (×4): 2 [IU] via SUBCUTANEOUS

## 2012-05-01 MED ORDER — VANCOMYCIN HCL 1000 MG IV SOLR
1500.0000 mg | INTRAVENOUS | Status: DC
  Administered 2012-05-02 – 2012-05-05 (×4): 1500 mg via INTRAVENOUS
  Filled 2012-05-01 (×5): qty 1500

## 2012-05-01 MED ORDER — ACETAMINOPHEN 325 MG PO TABS
650.0000 mg | ORAL_TABLET | Freq: Four times a day (QID) | ORAL | Status: DC | PRN
  Administered 2012-05-01 – 2012-05-06 (×8): 650 mg via ORAL
  Filled 2012-05-01 (×8): qty 2

## 2012-05-01 MED ORDER — SODIUM CHLORIDE 0.9 % IV SOLN: INTRAVENOUS | Status: DC

## 2012-05-01 MED ORDER — FUROSEMIDE 20 MG PO TABS
20.0000 mg | ORAL_TABLET | Freq: Two times a day (BID) | ORAL | Status: DC
  Administered 2012-05-01 – 2012-05-04 (×7): 20 mg via ORAL
  Filled 2012-05-01 (×9): qty 1

## 2012-05-01 MED ORDER — ASPIRIN 81 MG PO TABS
81.0000 mg | ORAL_TABLET | Freq: Every day | ORAL | Status: DC
  Filled 2012-05-01: qty 1

## 2012-05-01 MED ORDER — INFLUENZA VIRUS VACC SPLIT PF IM SUSP
0.5000 mL | INTRAMUSCULAR | Status: AC
  Administered 2012-05-02: 0.5 mL via INTRAMUSCULAR
  Filled 2012-05-01: qty 0.5

## 2012-05-01 MED ORDER — CLINDAMYCIN PHOSPHATE 900 MG/50ML IV SOLN
900.0000 mg | Freq: Once | INTRAVENOUS | Status: AC
  Administered 2012-05-01: 900 mg via INTRAVENOUS
  Filled 2012-05-01: qty 50

## 2012-05-01 MED ORDER — ACETAMINOPHEN 650 MG RE SUPP: 650.0000 mg | Freq: Four times a day (QID) | RECTAL | Status: DC | PRN

## 2012-05-01 MED ORDER — DONEPEZIL HCL 5 MG PO TABS
5.0000 mg | ORAL_TABLET | Freq: Every day | ORAL | Status: DC
  Administered 2012-05-01 – 2012-05-10 (×10): 5 mg via ORAL
  Filled 2012-05-01 (×11): qty 1

## 2012-05-01 MED ORDER — HYDROMORPHONE HCL PF 1 MG/ML IJ SOLN
0.5000 mg | INTRAMUSCULAR | Status: DC | PRN
  Administered 2012-05-01 – 2012-05-05 (×3): 0.5 mg via INTRAVENOUS
  Filled 2012-05-01 (×3): qty 1

## 2012-05-01 MED ORDER — VITAMIN D 50 MCG (2000 UT) PO TABS
2000.0000 [IU] | ORAL_TABLET | Freq: Every day | ORAL | Status: DC
  Administered 2012-05-01 – 2012-05-11 (×11): 2000 [IU] via ORAL
  Filled 2012-05-01 (×11): qty 1

## 2012-05-01 MED ORDER — VANCOMYCIN HCL 1000 MG IV SOLR
1500.0000 mg | INTRAVENOUS | Status: DC
  Filled 2012-05-01 (×2): qty 1500

## 2012-05-01 MED ORDER — ATORVASTATIN CALCIUM 40 MG PO TABS
40.0000 mg | ORAL_TABLET | Freq: Every evening | ORAL | Status: DC
  Administered 2012-05-01 – 2012-05-10 (×9): 40 mg via ORAL
  Filled 2012-05-01 (×11): qty 1

## 2012-05-01 MED ORDER — INSULIN GLARGINE 100 UNIT/ML ~~LOC~~ SOLN
35.0000 [IU] | Freq: Two times a day (BID) | SUBCUTANEOUS | Status: DC
  Administered 2012-05-01 – 2012-05-02 (×3): 35 [IU] via SUBCUTANEOUS

## 2012-05-01 MED ORDER — ASPIRIN 81 MG PO CHEW
81.0000 mg | CHEWABLE_TABLET | Freq: Every day | ORAL | Status: DC
  Administered 2012-05-01 – 2012-05-11 (×11): 81 mg via ORAL
  Filled 2012-05-01 (×11): qty 1

## 2012-05-01 MED ORDER — METOPROLOL TARTRATE 25 MG PO TABS
25.0000 mg | ORAL_TABLET | Freq: Two times a day (BID) | ORAL | Status: DC
  Administered 2012-05-01 – 2012-05-11 (×20): 25 mg via ORAL
  Filled 2012-05-01 (×21): qty 1

## 2012-05-01 MED ORDER — SODIUM CHLORIDE 0.9 % IV SOLN
Freq: Once | INTRAVENOUS | Status: AC
  Administered 2012-05-01: 16:00:00 via INTRAVENOUS

## 2012-05-01 MED ORDER — SODIUM CHLORIDE 0.9 % IV SOLN
1500.0000 mg | Freq: Once | INTRAVENOUS | Status: AC
  Administered 2012-05-01: 1500 mg via INTRAVENOUS
  Filled 2012-05-01: qty 1500

## 2012-05-01 MED ORDER — PANTOPRAZOLE SODIUM 40 MG PO TBEC
40.0000 mg | DELAYED_RELEASE_TABLET | Freq: Every day | ORAL | Status: DC
  Administered 2012-05-01 – 2012-05-11 (×11): 40 mg via ORAL
  Filled 2012-05-01 (×11): qty 1

## 2012-05-01 MED ORDER — SODIUM CHLORIDE 0.9 % IV SOLN
INTRAVENOUS | Status: DC
  Administered 2012-05-01 – 2012-05-06 (×6): via INTRAVENOUS

## 2012-05-01 NOTE — H&P (Addendum)
[name] is an 78 y.o. female.   PCP:   [name], MD   Chief Complaint:  R groin infection  HPI: 5 F being admitted for IV Abx for R Groin Infection. She was seen in our office and sent out to ED for Eval and Rx of Groin issue that stemmed from inappropriate use of Insulin into R groin area.  It was felt to be an abscess possibly needing I and D in the OR.  She was seen by ED Doc and Gen Surgery who felt that Med admission and started on Clinda and Vanco. She is Tired and having Chills      Past Medical History:  Past Medical History  Diagnosis Date  . Coronary artery disease     Severe two vessel; s/p CABG  . Hypertension   . Diabetes mellitus     Type 2  . Hyperlipidemia   . Diabetic neuropathy   . PAF (paroxysmal atrial fibrillation)     failed medical therapy with amiodarone; not a candidate for class IC meds due to her CAD. No Multaq due to CHF hx; QT prolonged with amiodarone; Not able to be ablated due to valve disease  . CVA (cerebral vascular accident) 2004  . Hypothyroidism   . Chronic anemia   . Diastolic dysfunction     per echo in May of 2012  . GERD (gastroesophageal reflux disease)   . Arthritis   . Valvular heart disease     has moderate mitral insufficiency with severe tricuspid insufficiency per TEE in November 2012   PMH: DM2 with Neuropathy/Non-Prolif DM Retinopathy/Cr 1.3-1.8 without microalbuminuria (- Renal US),  CAD/NQWMI/3vCABG 06/2009/ Mild CM c EF 50%  Dr [place].  HTN,  Chronic Anticoagulation for Parox Aflutter (Parox Afib post CABG Surgery) - AFIB c RVR admission 11/2010,  Hyperlipidemia,  Vit D def,   GERD, Hypothyroidism,  Chronic Anemia - Multifactorial with elevated ESR/ANR 1:160 Atypical Speckled Pattern/IgG  Lambda Monoclonal Gammopathy of Unknown Significance/B12 Def on Aranesp/Iron/B12 per Dr Arline Asp,  OA esp L Knee per Dr Lequita Halt,  Ventral Hernia,  Diverticulitis.   H/O CVA 10/04.  LE Edema/CVI/venous Stasis, Mild AV Stenosis,  Mild MR and TR.  Fibrocystic Breast Bx 09/2009,  Insomnia,  DDD L/S Spine,  Sig Bronchitis with Hemoptysis 09/13/10 (-) CXR, Obesity but weight dropped from 232 prior to CABG to 202 09/2010 LGIB 10/2011  1. Anemia felt to be secondary to CKDz dating back to 1/03. This patient also has a history of iron-deficiency anemia and a vitamin B12 deficiency. She has received IV iron in 10/04 and 9/08. Remains on oral vitamin B12.  2. IgG lambda monoclonal gammopathy of uncertain significance dating back to November 04.   3. Angiodysplasia with history of Hemoccult-positive stools.   4. History of elevated ANA 1:160, atypical speckled dating back to November 2005.   5. CAD s/p CABG by Dr. Evelene Croon on 06/11/2009.   6. Tricuspid insufficiency by TEE in 04/2011.   7. History of atrial fibrillation/flutter.   8. Systolic ejection murmur.   9. Renal insufficiency.   10. History of syncope late 04/2011.   11. History of acute right pontine stroke 04/09/2003.   12. Diabetes mellitus.   13. Hypertension.   14. Dyslipidemia.   15. Hypothyroidism.   16. Gastroesophageal reflux disease.   17. Osteoarthritis.   18. Right total knee replacement 12/03/2003.   19. Abdominal hernia. 20. Diverticulosis. 21. Admission for GI bleeding felt to be due to diverticulosis  in April 2013. 22. Hiatal hernia.    Past Surgical History  Procedure Date  . Coronary artery bypass graft 06/2009    X3, LIMA to LAD, SVG to diagonal, SVG to the posterior descending artery.  . Tonsillectomy   . Knee arthroscopy   . Transthoracic echocardiogram 11/12/2010     Ejection fraction felt to be around 50%.  Wall thickness was increased in a pattern of mild LVH.  Moderately dilated left atrium.  Mildly dilated right atrium. Right ventricle was mildly dilated with mildly reduced systolic function  . Cardiac catheterization 06/09/2009    inferior wall hypokinesia with ejection fraction of 55%.  . Abdominal hysterectomy   . Joint  replacement   . Tee without cardioversion 05/23/2011    Procedure: TRANSESOPHAGEAL ECHOCARDIOGRAM (TEE);  Surgeon: Lewayne Bunting, MD;  Location[facility] ENDOSCOPY;  Service: Cardiovascular;  Laterality: N/A;     TAH 1979.  Fallopian tube tumor surgery 04/02. R knee arthroscopy 2/03.  R cataract.  L cataract. R tkr 6/05.   T&A as a child.   CAD/3vCABG 06/2009. Breast Bx (-) 2011  Allergies:   Allergies  Allergen Reactions  . Betadine (Povidone Iodine) Itching  . Capoten (Captopril) Other (See Comments)    unknown  . Darvocet (Propoxyphene-Acetaminophen) Other (See Comments)    unknown  . Penicillins Swelling     Medications: Prior to Admission medications   Medication Sig Start Date End Date Taking? Authorizing Provider  amitriptyline (ELAVIL) 25 MG tablet Take 75 mg by mouth at bedtime.     Yes Historical Provider, MD  aspirin 81 MG tablet Take 81 mg by mouth daily.     Yes Historical Provider, MD  atorvastatin (LIPITOR) 40 MG tablet Take 40 mg by mouth every evening.    Yes Historical Provider, MD  Cholecalciferol (VITAMIN D) 2000 UNITS tablet Take 2,000 Units by mouth daily.     Yes Historical Provider, MD  cyanocobalamin 1000 MCG tablet Take 1,000 mcg by mouth daily. 10/25/11 10/24/12 Yes [name], MD  donepezil (ARICEPT) 5 MG tablet Take 5 mg by mouth at bedtime.   Yes Historical Provider, MD  esomeprazole (NEXIUM) 40 MG capsule Take 40 mg by mouth daily before breakfast.     Yes Historical Provider, MD  fish oil-omega-3 fatty acids 1000 MG capsule Take 1 g by mouth daily.    Yes Historical Provider, MD  furosemide (LASIX) 20 MG tablet Take 20 mg by mouth 2 (two) times daily. 08/19/11  Yes Peter M [place], MD  glimepiride (AMARYL) 4 MG tablet Take 4 mg by mouth 2 (two) times daily.    Yes Historical Provider, MD  insulin glargine (LANTUS) 100 UNIT/ML injection Inject 35 Units into the skin 2 (two) times daily.    Yes Historical Provider, MD  insulin lispro (HUMALOG) 100 UNIT/ML  injection Inject 50 Units into the skin 3 (three) times daily before meals.   Yes Historical Provider, MD  IRON PO Take 1 tablet by mouth daily.    Yes Historical Provider, MD  levothyroxine (SYNTHROID, LEVOTHROID) 100 MCG tablet Take 100 mcg by mouth daily.   Yes Historical Provider, MD  MAGNESIUM PO Take 1 tablet by mouth daily.    Yes Historical Provider, MD  metoprolol (LOPRESSOR) 50 MG tablet Take 25 mg by mouth 2 (two) times daily. 10/25/11  Yes [name], MD   Complete Medication List: 1)  Donepezil Hcl 5 Mg Tbdp (Donepezil hcl) .... One po hs 2)  Magnesium 250 Mg  Tabs (Magnesium) .... Take 1 tablet by mouth once a day 3)  Amitriptyline Hcl 25 Mg Tabs (Amitriptyline hcl) .... 3 po qhs 4)  Aranesp (albumin Free) 300 Mcg/ml Soln (Darbepoetin alfa-polysorbate) .... Once monthly 5)  Fish Oil Oil (Fish oil) .... Qd 6)  Lantus 100 Unit/ml Soln (Insulin glargine) .... 35 units bid 7)  Metoprolol Succinate 50 Mg Xr24h-tab (Metoprolol succinate) .... Take 1/2 tablet by mouth twice a day 8)  Synthroid 100 Mcg Tabs (Levothyroxine sodium) .Marland Kitchen.. 1 po qd 9)  Vitamin D3 1000 Unit Tabs (Cholecalciferol) .Marland Kitchen.. 1 po qd 10)  Lipitor 80 Mg Tabs (Atorvastatin calcium) .Marland Kitchen.. 1 po qd 11)  Furosemide 20 Mg Tabs (Furosemide) .Marland Kitchen.. 1 po bid 12)  Nexium 40 Mg Cpdr (Esomeprazole magnesium) .Marland Kitchen.. 1 po qd 13)  Amaryl 4 Mg Tabs (Glimepiride) .Marland Kitchen.. 1 po bid 14)  Aspirin 81 Mg Ec Tab (Aspirin) .... Take one (1) tablet by mouth daily 15)  Humalog 100 Unit/ml Soln (Insulin lispro (human)) .... She is to do the humalog 15-20 units 3 times per day with meals.  if cbg 100-199 give 15 units and if cbg > 200 give 20 units 16)  Cyanocobalamin 1000 Mcg/ml Inj Soln (Cyanocobalamin) .... Once per month     (Not in a hospital admission)   Social History:  reports that she has quit smoking. She quit smokeless tobacco use about 39 years ago. She reports that she does not drink alcohol or use illicit drugs.   Pt is married with  2 children and 2 grandchildren. Spouse Turquoise Esch also a pt. 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 No hx of tobacco,alchohol,drugs.    Family History: Family History  Problem Relation Age of Onset  . Colon cancer Mother   . Stroke Mother   . Hypertension Mother   . Cancer Father   . Heart attack Father     x2    Father CAD/Head & Neck Cancer.  Mother deceased DM,Colon CA.  6 sisters. DM in older sisters.  Review of Systems:  Review of Systems - General:       Complains of chills, fatigue.        Denies fevers, headache, sweats.   Eyes:       Denies blurring, diplopia, irritation, discharge, vision loss, eye pain, photophobia.   Cardiovascular:       Denies chest pains, claudication, palpitations, syncope, dyspnea on exertion, orthopnea, PND, peripheral edema.   Respiratory:       Denies cough, dyspnea, excessive sputum, hemoptysis, wheezing.   Gastrointestinal:       Denies nausea, vomiting, diarrhea, constipation, heartburn, change in bowel habits, abdominal pain, melena, hematochezia, jaundice.   Musculoskeletal:       Complains of stiffness, arthritis.   Skin:       Denies rash, itching, dryness, suspicious lesions.   Neurologic:       Denies radicular symptoms, weakness, paresthesias, seizures, syncope, tremors, vertigo, dizziness, gait instability.   Psychiatric:       Complains of memory loss.   Endocrine:       Denies cold intolerance, heat intolerance, polydipsia, polyphagia, polyuria, weight change.       Physical Exam:  Blood pressure 151/61, pulse 91, temperature 101.3 F (38.5 C), temperature source Oral, resp. rate 24, SpO2 98.00%. Filed Vitals:   05/01/12 1400 05/01/12 1525 05/01/12 1600 05/01/12 1700  BP: 138/53 152/51 151/61 151/61  Pulse: 86 98 91   Temp: 100.1 F (37.8 C) 101.3 F (38.5 C)  TempSrc: Oral Oral    Resp: 18 19 16 24   SpO2: 96% 97% 98% 98%   General appearance: well nourished, well hydrated, no acute distress.  Ears, Nose and Throat    External ears: normal, no lesions or deformities External nose: normal, no lesions or deformities Otoscopic: canals clear, tympanic membranes intact, no fluid Hearing: grossly intact Nasal: mucosa, septum, and turbinates normal Dental: good dentition Pharynx: tongue normal, protrudes mid line,  posterior pharynx without erythema or exudate  Respiratory  Respiratory effort: no intercostal retractions or use of accessory muscles Auscultation: no rales, rhonchi, or wheezes  Cardiovascular  Palpation: no thrill or palpable murmurs, no displacement of PMI Auscultation: Reg? no murmur, rub, or gallop Carotid arteries: pulses 2+, symmetric, no bruits Periph. circulation: no cyanosis, clubbing, SVG scar.  Thick ankles.  1+ Edema/venous stasis/Brawny edema.  Gastrointestinal  Abdomen: soft, non-tender, no masses, bowel sounds normal Liver and spleen: no enlargement or nodularity  Lymphatic  Neck: no cervical adenopathy  Musculoskeletal  Gait and station: slow and steady RUE: normal ROM and strength, no joint enlargement or tenderness LUE: normal ROM and strength, no joint enlargement or tenderness RLE: normal ROM and strength, no joint enlargement or tenderness LLE: normal ROM and strength, no joint enlargement or tenderness   Lymph nodes: Cervical adenopathy: no cervical lymphadenopathy Neurologic: Alert and oriented X 3, normal strength and tone. Normal symmetric reflexes.     Labs on Admission:   [facility] 05/01/12 1434  NA 135  K 4.1  CL 95*  CO2 28  GLUCOSE 345*  BUN 20  CREATININE 1.52*  CALCIUM 8.9  MG --  PHOS --    Basename 05/01/12 1434  AST 26  ALT 17  ALKPHOS 98  BILITOT 0.8  PROT 8.1  ALBUMIN 3.2*   No results found for this basename: LIPASE:2,AMYLASE:2 in the last 72 hours  Basename 05/01/12 1434  WBC 13.4*  NEUTROABS 9.8*  HGB 11.4*  HCT 34.9*  MCV 93.8  PLT 158   No results found for this basename: CKTOTAL:3,CKMB:3,CKMBINDEX:3,TROPONINI:3  in the last 72 hours Lab Results  Component Value Date   INR 1.79* 10/25/2011   INR 2.24* 10/24/2011   INR 2.12* 10/22/2011     LAB RESULT POCT:  Results for orders placed during the hospital encounter of 05/01/12  CBC WITH DIFFERENTIAL      Component Value Range   WBC 13.4 (*) 4.0 - 10.5 K/uL   RBC 3.72 (*) 3.87 - 5.11 MIL/uL   Hemoglobin 11.4 (*) 12.0 - 15.0 g/dL   HCT 40.9 (*) 81.1 - 91.4 %   MCV 93.8  78.0 - 100.0 fL   MCH 30.6  26.0 - 34.0 pg   MCHC 32.7  30.0 - 36.0 g/dL   RDW 78.2  95.6 - 21.3 %   Platelets 158  150 - 400 K/uL   Neutrophils Relative 73  43 - 77 %   Neutro Abs 9.8 (*) 1.7 - 7.7 K/uL   Lymphocytes Relative 13  12 - 46 %   Lymphs Abs 1.8  0.7 - 4.0 K/uL   Monocytes Relative 13 (*) 3 - 12 %   Monocytes Absolute 1.8 (*) 0.1 - 1.0 K/uL   Eosinophils Relative 0  0 - 5 %   Eosinophils Absolute 0.0  0.0 - 0.7 K/uL   Basophils Relative 0  0 - 1 %   Basophils Absolute 0.0  0.0 - 0.1 K/uL  COMPREHENSIVE METABOLIC PANEL      Component Value  Range   Sodium 135  135 - 145 mEq/L   Potassium 4.1  3.5 - 5.1 mEq/L   Chloride 95 (*) 96 - 112 mEq/L   CO2 28  19 - 32 mEq/L   Glucose, Bld 345 (*) 70 - 99 mg/dL   BUN 20  6 - 23 mg/dL   Creatinine, Ser 4.09 (*) 0.50 - 1.10 mg/dL   Calcium 8.9  8.4 - 81.1 mg/dL   Total Protein 8.1  6.0 - 8.3 g/dL   Albumin 3.2 (*) 3.5 - 5.2 g/dL   AST 26  0 - 37 U/L   ALT 17  0 - 35 U/L   Alkaline Phosphatase 98  39 - 117 U/L   Total Bilirubin 0.8  0.3 - 1.2 mg/dL   GFR calc non Af Amer 32 (*) >90 mL/min   GFR calc Af Amer 37 (*) >90 mL/min  LACTIC ACID, PLASMA      Component Value Range   Lactic Acid, Venous 1.4  0.5 - 2.2 mmol/L      Radiological Exams on Admission: No results found.    Orders placed during the hospital encounter of 10/22/11  . ED EKG  . ED EKG  . EKG 12-LEAD  . EKG 12-LEAD  . EKG     Assessment/Plan Active Problems:  * No active hospital problems. *   1.  Sig R Thigh CELLULITIS AND ABSCESS OF  LEG EXCEPT FOOT (ICD-682.6) I sent her to ED for Eval and Treat.  She has a low grade Temp and Leukocytosis of 13.4   She has been Rxed with Clinda and Vanco and Gen surgery has seen her and recommended admission to Gen med for "Cellulitis".  I am still suspicious that she will probably need I and D in the OR for presumed Abscess.  Hot Packs.  She will need to be re-educated on Insulin Administration.  Control DM.  I will order a diet and if [facility] intends to take her to OR they need to make her NPO.  I will Hold Lovenox and use SCDs for DVT proph in case she ends up in ED.  Follow labs.  ? Whether CT or [place] may help in W/up.  2.  DM2 with Neuropathy/Non-Prolif DM Retinopathy/Cr 1.3-1.8 without microalbuminuria - Lantus and ISS.  Cr 1,5 and stable  3.  CAD/NQWMI/3vCABG 06/2009/ Mild CM c EF 50%  Dr [place].  No Angina.   4 H/O AFib/Flutter - No current issue  5. Hypothyroidism on meds  6. Chronic Anemia - Multifactorial.  At 11.4 - stable   7. MGUS - Dr Arline Asp,    8. OA esp L Knee per Dr Lequita Halt,    9. LE Edema/CVI/venous Stasis  - on diuretics,  10 Obesity  11. HTN - Stable.    Cookie Pore M 05/01/2012, 5:34 PM

## 2012-05-01 NOTE — Progress Notes (Signed)
ANTIBIOTIC CONSULT NOTE - INITIAL  Pharmacy Consult for vancomycin Indication: groin cellulitis/?abscess  Allergies  Allergen Reactions  . Betadine (Povidone Iodine) Itching  . Capoten (Captopril) Other (See Comments)    unknown  . Darvocet (Propoxyphene-Acetaminophen) Other (See Comments)    unknown  . Penicillins Swelling    Patient Measurements:   Adjusted Body Weight:   Vital Signs: Temp: 101.3 F (38.5 C) (10/29 1525) Temp src: Oral (10/29 1525) BP: 152/51 mmHg (10/29 1525) Pulse Rate: 98  (10/29 1525) Intake/Output from previous day:   Intake/Output from this shift:    Labs:  Basename 05/01/12 1434  WBC 13.4*  HGB 11.4*  PLT 158  LABCREA --  CREATININE 1.52*   The CrCl is unknown because both a height and weight (above a minimum accepted value) are required for this calculation. No results found for this basename: VANCOTROUGH:2,VANCOPEAK:2,VANCORANDOM:2,GENTTROUGH:2,GENTPEAK:2,GENTRANDOM:2,TOBRATROUGH:2,TOBRAPEAK:2,TOBRARND:2,AMIKACINPEAK:2,AMIKACINTROU:2,AMIKACIN:2, in the last 72 hours   Microbiology: No results found for this or any previous visit (from the past 720 hour(s)).  Medical History: Past Medical History  Diagnosis Date  . Coronary artery disease     Severe two vessel; s/p CABG  . Hypertension   . Diabetes mellitus     Type 2  . Hyperlipidemia   . Diabetic neuropathy   . PAF (paroxysmal atrial fibrillation)     failed medical therapy with amiodarone; not a candidate for class IC meds due to her CAD. No Multaq due to CHF hx; QT prolonged with amiodarone; Not able to be ablated due to valve disease  . CVA (cerebral vascular accident) 2004  . Hypothyroidism   . Chronic anemia   . Diastolic dysfunction     per echo in May of 2012  . GERD (gastroesophageal reflux disease)   . Arthritis   . Valvular heart disease     has moderate mitral insufficiency with severe tricuspid insufficiency per TEE in November 2012    Medications:    Amitriptyline, asa, lipitor, vitamin D, cyanocobalamin, donepezil, nexium, lovaza, lasix, glimepiride, lantus, humalog, iron, synthroid, magnesium, metoprolol,   Assessment: 79 yof presented to the ED with groin wound/swelling. Pt is febrile with a Tmax of 101.3 and elevated WBC of 13.4. Scr is slightly elevated. She has received 1x dose of clindamycin in the ED so far.   Vanc 10/29>>  Goal of Therapy:  Vancomycin trough level 10-15 mcg/ml  Plan:  1. Vancomycin 1500mg  IV Q24H 2. F/u renal fxn, C&S, clinical status and trough at [facility]  Tamieka Rancourt, [name] 05/01/2012,4:01 PM

## 2012-05-01 NOTE — ED Notes (Signed)
Patient with right groin abcess sent here to see Dr. Timothy Lasso. MD has been paged

## 2012-05-01 NOTE — ED Notes (Signed)
[facility] paged, Tresa Endo [place] aware patient is in the ED

## 2012-05-01 NOTE — Consult Note (Signed)
Reason for Consult:  Groin Abscess Referring Physician: Dr. Edward Jolly is an 78 y.o. female.  HPI: We were asked to see Mrs. Anselmi a 78 y/o female with very painful right groin mass since Sunday.  She had seen her physician assistant today in the office who had referred her to the ER for suspected abscess.  She c/o of worsening groin pain and chills, but no fever. After further questioning she states she was giving herself insulin injections in the thigh and noticed when she withdrew it, it was bent.  That was the location the mass developed.   Pt has never had a similar episode.  She was found to have a cellulitis and an elevated WBC count upon admission.  Past Medical History  Diagnosis Date  . Coronary artery disease     Severe two vessel; s/p CABG  . Hypertension   . Diabetes mellitus     Type 2  . Hyperlipidemia   . Diabetic neuropathy   . PAF (paroxysmal atrial fibrillation)     failed medical therapy with amiodarone; not a candidate for class IC meds due to her CAD. No Multaq due to CHF hx; QT prolonged with amiodarone; Not able to be ablated due to valve disease  . CVA (cerebral vascular accident) 2004  . Hypothyroidism   . Chronic anemia   . Diastolic dysfunction     per echo in May of 2012  . GERD (gastroesophageal reflux disease)   . Arthritis   . Valvular heart disease     has moderate mitral insufficiency with severe tricuspid insufficiency per TEE in November 2012    Past Surgical History  Procedure Date  . Coronary artery bypass graft 06/2009    X3, LIMA to LAD, SVG to diagonal, SVG to the posterior descending artery.  . Tonsillectomy   . Knee arthroscopy   . Transthoracic echocardiogram 11/12/2010     Ejection fraction felt to be around 50%.  Wall thickness was increased in a pattern of mild LVH.  Moderately dilated left atrium.  Mildly dilated right atrium. Right ventricle was mildly dilated with mildly reduced systolic function  . Cardiac  catheterization 06/09/2009    inferior wall hypokinesia with ejection fraction of 55%.  . Abdominal hysterectomy   . Joint replacement   . Tee without cardioversion 05/23/2011    Procedure: TRANSESOPHAGEAL ECHOCARDIOGRAM (TEE);  Surgeon: Lewayne Bunting, MD;  Location: [facility] ENDOSCOPY;  Service: Cardiovascular;  Laterality: N/A;    Family History  Problem Relation Age of Onset  . Colon cancer Mother   . Stroke Mother   . Hypertension Mother   . Cancer Father   . Heart attack Father     x2    Social History:  reports that she has quit smoking. She quit smokeless tobacco use about 39 years ago. She reports that she does not drink alcohol or use illicit drugs.  Allergies:  Allergies  Allergen Reactions  . Betadine (Povidone Iodine) Itching  . Capoten (Captopril) Other (See Comments)    unknown  . Darvocet (Propoxyphene-Acetaminophen) Other (See Comments)    unknown  . Penicillins Swelling    Medications: I have reviewed the patient's current medications.  Results for orders placed during the hospital encounter of 05/01/12 (from the past 48 hour(s))  CBC WITH DIFFERENTIAL     Status: Abnormal   Collection Time   05/01/12  2:34 PM      Component Value Range Comment   WBC 13.4 (*) 4.0 -  10.5 K/uL    RBC 3.72 (*) 3.87 - 5.11 MIL/uL    Hemoglobin 11.4 (*) 12.0 - 15.0 g/dL    HCT 16.1 (*) 09.6 - 46.0 %    MCV 93.8  78.0 - 100.0 fL    MCH 30.6  26.0 - 34.0 pg    MCHC 32.7  30.0 - 36.0 g/dL    RDW 04.5  40.9 - 81.1 %    Platelets 158  150 - 400 K/uL    Neutrophils Relative 73  43 - 77 %    Neutro Abs 9.8 (*) 1.7 - 7.7 K/uL    Lymphocytes Relative 13  12 - 46 %    Lymphs Abs 1.8  0.7 - 4.0 K/uL    Monocytes Relative 13 (*) 3 - 12 %    Monocytes Absolute 1.8 (*) 0.1 - 1.0 K/uL    Eosinophils Relative 0  0 - 5 %    Eosinophils Absolute 0.0  0.0 - 0.7 K/uL    Basophils Relative 0  0 - 1 %    Basophils Absolute 0.0  0.0 - 0.1 K/uL   COMPREHENSIVE METABOLIC PANEL     Status:  Abnormal   Collection Time   05/01/12  2:34 PM      Component Value Range Comment   Sodium 135  135 - 145 mEq/L    Potassium 4.1  3.5 - 5.1 mEq/L HEMOLYZED SPECIMEN, RESULTS MAY BE AFFECTED   Chloride 95 (*) 96 - 112 mEq/L    CO2 28  19 - 32 mEq/L    Glucose, Bld 345 (*) 70 - 99 mg/dL    BUN 20  6 - 23 mg/dL    Creatinine, Ser 9.14 (*) 0.50 - 1.10 mg/dL    Calcium 8.9  8.4 - 78.2 mg/dL    Total Protein 8.1  6.0 - 8.3 g/dL    Albumin 3.2 (*) 3.5 - 5.2 g/dL    AST 26  0 - 37 U/L    ALT 17  0 - 35 U/L    Alkaline Phosphatase 98  39 - 117 U/L    Total Bilirubin 0.8  0.3 - 1.2 mg/dL    GFR calc non Af Amer 32 (*) >90 mL/min    GFR calc Af Amer 37 (*) >90 mL/min   LACTIC ACID, PLASMA     Status: Normal   Collection Time   05/01/12  2:34 PM      Component Value Range Comment   Lactic Acid, Venous 1.4  0.5 - 2.2 mmol/L     No results found.  Review of Systems  Constitutional: Positive for fever and chills. Negative for weight loss, malaise/fatigue and diaphoresis.  Eyes: Negative for blurred vision.  Respiratory: Negative for cough and wheezing.   Cardiovascular: Negative for chest pain and leg swelling.  Gastrointestinal: Negative for nausea, vomiting, abdominal pain and diarrhea.  Genitourinary: Negative for dysuria.  Musculoskeletal: Negative for myalgias.  Neurological: Negative for weakness and headaches.   Blood pressure 152/51, pulse 98, temperature 101.3 F (38.5 C), temperature source Oral, resp. rate 19, SpO2 97.00%. Physical Exam  Constitutional: She is oriented to person, place, and time. She appears well-developed and well-nourished.  HENT:  Head: Normocephalic and atraumatic.  Eyes: Conjunctivae normal and EOM are normal. Right eye exhibits no discharge.  Cardiovascular: Normal rate and regular rhythm.  Exam reveals no gallop and no friction rub.   No murmur heard. Respiratory: Effort normal and breath sounds normal. She has no wheezes.  She has no rales.  GI:  Soft. Bowel sounds are normal. She exhibits no distension. There is no tenderness. There is no rebound.  Neurological: She is alert and oriented to person, place, and time.  Skin: Skin is warm and dry. Ecchymosis noted. She is not diaphoretic.     Psychiatric: She has a normal mood and affect.    Assessment/Plan: 78 y/o female with right inguinal cellulitis 1.  Start IV antibiotics (Clinda started, add Vanc for MRSA coverage) 2.  Patient has extensive chronic medical conditions and would recommend she be admitted to medicine for continued monitoring of her chronic conditions 3.  Hot packs to the area 4.  If the area develops fluctuance, may need bedside I&D or surgical intervention  DORT, Makarios Madlock 05/01/2012, 4:14 PM

## 2012-05-01 NOTE — ED Notes (Signed)
C/o abscess to right groin area started Saturday & progressively worsening. States getting larger & more painful. C/o chills, denies drainage. Area red, firm & very warm to touch. Seen by PCP today & instructed to go to ED. To be seen by CCS

## 2012-05-01 NOTE — Consult Note (Signed)
Agree with PA Dort's consult Note  Pt with cellulitis and nothing to drain at this point.  Would recommend con't IV abx. Will follow with you

## 2012-05-01 NOTE — ED Notes (Signed)
PA with CCS at bedside. Aware of elevated temp

## 2012-05-01 NOTE — ED Provider Notes (Addendum)
History     CSN: [id number]  Arrival date & time 05/01/12  1309   First MD Initiated Contact with Patient 05/01/12 1350      Chief Complaint  Patient presents with  . Groin Swelling    (Consider location/radiation/quality/duration/timing/severity/associated sxs/prior treatment) HPI  The patient presents with concerns of a new right groin lesion.  The wound developed gradually over the past 2 days.  Concurrently she has had intermittent chills.  She denies any ongoing fever, nausea, vomiting, diarrhea, dysuria, dyspnea, chest pain or other focal complaints. No similar prior episodes. The patient saw her physician's assistant today, and was referred here for evaluation.  Past Medical History  Diagnosis Date  . Coronary artery disease     Severe two vessel; s/p CABG  . Hypertension   . Diabetes mellitus     Type 2  . Hyperlipidemia   . Diabetic neuropathy   . PAF (paroxysmal atrial fibrillation)     failed medical therapy with amiodarone; not a candidate for class IC meds due to her CAD. No Multaq due to CHF hx; QT prolonged with amiodarone; Not able to be ablated due to valve disease  . CVA (cerebral vascular accident) 2004  . Hypothyroidism   . Chronic anemia   . Diastolic dysfunction     per echo in May of 2012  . GERD (gastroesophageal reflux disease)   . Arthritis   . Valvular heart disease     has moderate mitral insufficiency with severe tricuspid insufficiency per TEE in November 2012    Past Surgical History  Procedure Date  . Coronary artery bypass graft 06/2009    X3, LIMA to LAD, SVG to diagonal, SVG to the posterior descending artery.  . Tonsillectomy   . Knee arthroscopy   . Transthoracic echocardiogram 11/12/2010     Ejection fraction felt to be around 50%.  Wall thickness was increased in a pattern of mild LVH.  Moderately dilated left atrium.  Mildly dilated right atrium. Right ventricle was mildly dilated with mildly reduced systolic function  .  Cardiac catheterization 06/09/2009    inferior wall hypokinesia with ejection fraction of 55%.  . Abdominal hysterectomy   . Joint replacement   . Tee without cardioversion 05/23/2011    Procedure: TRANSESOPHAGEAL ECHOCARDIOGRAM (TEE);  Surgeon: Lewayne Bunting, MD;  Location: [facility] ENDOSCOPY;  Service: Cardiovascular;  Laterality: N/A;    Family History  Problem Relation Age of Onset  . Colon cancer Mother   . Stroke Mother   . Hypertension Mother   . Cancer Father   . Heart attack Father     x2    History  Substance Use Topics  . Smoking status: Former Games developer  . Smokeless tobacco: Former Neurosurgeon    Quit date: 07/04/1972  . Alcohol Use: No    OB History    Grav Para Term Preterm Abortions TAB SAB Ect Mult Living                  Review of Systems  Constitutional:       Per HPI, otherwise negative  HENT:       Per HPI, otherwise negative  Eyes: Negative.   Respiratory:       Per HPI, otherwise negative  Cardiovascular:       Per HPI, otherwise negative  Gastrointestinal: Negative for nausea.  Genitourinary: Negative.   Musculoskeletal:       Per HPI, otherwise negative  Skin: Positive for wound.  Neurological: Negative for  weakness.    Allergies  Betadine; Capoten; Darvocet; and Penicillins  Home Medications   Current Outpatient Rx  Name Route Sig Dispense Refill  . AMITRIPTYLINE HCL 25 MG PO TABS Oral Take 75 mg by mouth at bedtime.      . ASPIRIN 81 MG PO TABS Oral Take 81 mg by mouth daily.      . ATORVASTATIN CALCIUM 40 MG PO TABS Oral Take 40 mg by mouth every evening.     Marland Kitchen VITAMIN D 2000 UNITS PO TABS Oral Take 2,000 Units by mouth daily.      . CYANOCOBALAMIN 1000 MCG PO TABS Oral Take 1,000 mcg by mouth daily.    . DONEPEZIL HCL 5 MG PO TABS Oral Take 5 mg by mouth at bedtime.    Marland Kitchen ESOMEPRAZOLE MAGNESIUM 40 MG PO CPDR Oral Take 40 mg by mouth daily before breakfast.      . OMEGA-3 FATTY ACIDS 1000 MG PO CAPS Oral Take 1 g by mouth daily.     .  FUROSEMIDE 20 MG PO TABS Oral Take 20 mg by mouth 2 (two) times daily.    Marland Kitchen GLIMEPIRIDE 4 MG PO TABS Oral Take 4 mg by mouth 2 (two) times daily.     . INSULIN GLARGINE 100 UNIT/ML [employer] SOLN Subcutaneous Inject 35 Units into the skin 2 (two) times daily.     . INSULIN LISPRO (HUMAN) 100 UNIT/ML  SOLN Subcutaneous Inject 50 Units into the skin 3 (three) times daily before meals.    . IRON PO Oral Take 1 tablet by mouth daily.     Marland Kitchen LEVOTHYROXINE SODIUM 100 MCG PO TABS Oral Take 100 mcg by mouth daily.    Marland Kitchen MAGNESIUM PO Oral Take 1 tablet by mouth daily.     Marland Kitchen METOPROLOL TARTRATE 50 MG PO TABS Oral Take 25 mg by mouth 2 (two) times daily.      BP 138/53  Pulse 86  Temp 100.1 F (37.8 C) (Oral)  Resp 18  SpO2 96%  Physical Exam  Nursing note and vitals reviewed. Constitutional: She is oriented to person, place, and time. She appears well-developed and well-nourished. No distress.  HENT:  Head: Normocephalic and atraumatic.  Eyes: Conjunctivae normal and EOM are normal.  Cardiovascular: Normal rate and regular rhythm.   Pulmonary/Chest: Effort normal and breath sounds normal. No stridor. No respiratory distress.       Midline sternal scar  Abdominal: She exhibits no distension.  Genitourinary:     Musculoskeletal: She exhibits no edema.  Neurological: She is alert and oriented to person, place, and time. No cranial nerve deficit.  Skin: Skin is warm and dry.  Psychiatric: She has a normal mood and affect.    ED Course  Procedures (including critical care time)   Labs Reviewed  CBC WITH DIFFERENTIAL  COMPREHENSIVE METABOLIC PANEL  LACTIC ACID, PLASMA   No results found.   No diagnosis found.  Pulse ox 99% room air normal   MDM  This pleasant female presents with new right leg lesion concerning for abscess.  The patient is in no distress, though she is notably tender lesion on her right inferior inguinal area.  Patient's mild leukocytosis, but is afebrile.  Surgery  was made aware the patient, and has assisted with her evaluation.  Per surgery, there is not a specific drainable fluid collection, and the patient should be admitted to medicine with them following.  I discussed the case with the patient's PMD, and she will  be admitted.  Gerhard Munch, MD 05/01/12 1539  Gerhard Munch, MD 05/01/12 [phone]

## 2012-05-01 NOTE — Progress Notes (Signed)
IVY PURYEAR [id number] Admission Data: 05/01/2012 6:26 PM Attending Provider: [name], MD  NWG:NFAOZ,HYQM M, MD Consults/ Treatment Team:    [name] is a 78 y.o. female patient admitted from ED awake, alert  & orientated  X 3,  Full Code, VSS - Blood pressure 135/54, pulse 92, temperature 100.1 F (37.8 C), temperature source Oral, resp. rate 21, SpO2 98.00%., O2    2 L nasal cannular, no c/o shortness of breath, no c/o chest pain, no distress noted.    IV site WDL:  forearm left, condition patent and no redness with a transparent dsg that's clean dry and intact.  Allergies:   Allergies  Allergen Reactions  . Betadine (Povidone Iodine) Itching  . Capoten (Captopril) Other (See Comments)    unknown  . Darvocet (Propoxyphene-Acetaminophen) Other (See Comments)    unknown  . Penicillins Swelling     Past Medical History  Diagnosis Date  . Coronary artery disease     Severe two vessel; s/p CABG  . Hypertension   . Diabetes mellitus     Type 2  . Hyperlipidemia   . Diabetic neuropathy   . PAF (paroxysmal atrial fibrillation)     failed medical therapy with amiodarone; not a candidate for class IC meds due to her CAD. No Multaq due to CHF hx; QT prolonged with amiodarone; Not able to be ablated due to valve disease  . CVA (cerebral vascular accident) 2004  . Hypothyroidism   . Chronic anemia   . Diastolic dysfunction     per echo in May of 2012  . GERD (gastroesophageal reflux disease)   . Arthritis   . Valvular heart disease     has moderate mitral insufficiency with severe tricuspid insufficiency per TEE in November 2012    History:  obtained from the patient. Tobacco/alcohol: denied none  Pt orientation to unit, room and routine. Information packet given to patient/family and safety video watched.  Admission INP armband ID verified with patient/family, and in place. SR up x 2, fall risk assessment complete with Patient and family verbalizing  understanding of risks associated with falls. Pt verbalizes an understanding of how to use the call bell and to call for help before getting out of bed.  Skin, clean-dry- intact without evidence of bruising, or skin tears.   No evidence of skin break down noted on exam. Does have cellulitis/abscess on R upper thigh   Will cont to monitor and assist as needed.  Leyton Magoon Consuella Lose, RN 05/01/2012 6:26 PM

## 2012-05-01 NOTE — Telephone Encounter (Signed)
Tresa Endo, [place] paged to call [facility] ED [phone]

## 2012-05-02 DIAGNOSIS — L02415 Cutaneous abscess of right lower limb: Secondary | ICD-10-CM | POA: Diagnosis present

## 2012-05-02 LAB — COMPREHENSIVE METABOLIC PANEL
CO2: 26 mEq/L (ref 19–32)
Calcium: 8.6 mg/dL (ref 8.4–10.5)
Chloride: 96 mEq/L (ref 96–112)
Creatinine, Ser: 1.58 mg/dL — ABNORMAL HIGH (ref 0.50–1.10)
GFR calc Af Amer: 35 mL/min — ABNORMAL LOW (ref >90)
GFR calc non Af Amer: 30 mL/min — ABNORMAL LOW (ref >90)
Glucose, Bld: 182 mg/dL — ABNORMAL HIGH (ref 70–99)
Total Bilirubin: 0.8 mg/dL (ref 0.3–1.2)

## 2012-05-02 LAB — GLUCOSE, CAPILLARY
Glucose-Capillary: 186 mg/dL — ABNORMAL HIGH (ref 70–99)
Glucose-Capillary: 269 mg/dL — ABNORMAL HIGH (ref 70–99)
Glucose-Capillary: 242 mg/dL — ABNORMAL HIGH (ref 70–99)

## 2012-05-02 LAB — CBC
Hemoglobin: 10.7 g/dL — ABNORMAL LOW (ref 12.0–15.0)
MCHC: 31.8 g/dL (ref 30.0–36.0)
Platelets: 140 10*3/uL — ABNORMAL LOW (ref 150–400)
RBC: 3.55 MIL/uL — ABNORMAL LOW (ref 3.87–5.11)

## 2012-05-02 LAB — PROTIME-INR
INR: 1.15 (ref 0.00–1.49)
Prothrombin Time: 14.5 seconds (ref 11.6–15.2)

## 2012-05-02 NOTE — Progress Notes (Signed)
Subjective: Admitted yesterday with R groin Cellulitis - ? Abscess. Appreciate Gen Surgery input. Remains on IV Abx. Tm 101.5. No new C/O Tender to touch  Objective: Vital signs in last 24 hours: Temp:  [98 F (36.7 C)-101.5 F (38.6 C)] 98.8 F (37.1 C) (10/30 0643) Pulse Rate:  [76-98] 76  (10/30 0445) Resp:  [16-24] 18  (10/30 0445) BP: (124-156)/(40-72) 124/54 mmHg (10/30 0445) SpO2:  [94 %-99 %] 99 % (10/30 0445) Weight:  [99.1 kg (218 lb 7.6 oz)-101.2 kg (223 lb 1.7 oz)] 101.2 kg (223 lb 1.7 oz) (10/30 0445) Weight change:  Last BM Date: 05/01/12  CBG (last 3)   Basename 05/02/12 0759 05/01/12 2150  GLUCAP 186* 280*    Intake/Output from previous day:  Intake/Output Summary (Last 24 hours) at 05/02/12 0825 Last data filed at 05/02/12 0448  Gross per 24 hour  Intake   1160 ml  Output      0 ml  Net   1160 ml   10/29 0701 - 10/30 0700 In: 1160 [I.V.:1160] Out: -    Physical Exam  General appearance: Alert  Eyes: no scleral icterus Throat: oropharynx moist without erythema Resp: CTA Cardio: Reg Sternotomy GI: soft, non-tender; bowel sounds normal; no masses,  no organomegaly R groin tender and warm.  Fluid collection. Extremities: no clubbing, cyanosis or edema   Lab Results:  Basename 05/02/12 0630 05/01/12 1434  NA 134* 135  K 3.7 4.1  CL 96 95*  CO2 26 28  GLUCOSE 182* 345*  BUN 20 20  CREATININE 1.58* 1.52*  CALCIUM 8.6 8.9  MG -- --  PHOS -- --     Basename 05/02/12 0630 05/01/12 1434  AST 22 26  ALT 16 17  ALKPHOS 88 98  BILITOT 0.8 0.8  PROT 7.5 8.1  ALBUMIN 2.9* 3.2*     Basename 05/02/12 0630 05/01/12 1434  WBC 14.1* 13.4*  NEUTROABS -- 9.8*  HGB 10.7* 11.4*  HCT 33.7* 34.9*  MCV 94.9 93.8  PLT 140* 158    Lab Results  Component Value Date   INR 1.15 05/02/2012   INR 1.79* 10/25/2011   INR 2.24* 10/24/2011    No results found for this basename: CKTOTAL:3,CKMB:3,CKMBINDEX:3,TROPONINI:3 in the last 72 hours  No  results found for this basename: TSH,T4TOTAL,FREET3,T3FREE,THYROIDAB in the last 72 hours  No results found for this basename: VITAMINB12:2,FOLATE:2,FERRITIN:2,TIBC:2,IRON:2,RETICCTPCT:2 in the last 72 hours  Micro Results: No results found for this or any previous visit (from the past 240 hour(s)).   Studies/Results: No results found.   Medications: Scheduled:    . sodium chloride   Intravenous Once  . amitriptyline  75 mg Oral QHS  . aspirin  81 mg Oral Daily  . atorvastatin  40 mg Oral QPM  . clindamycin (CLEOCIN) IV  900 mg Intravenous Once  . clindamycin (CLEOCIN) IV  900 mg Intravenous Q8H  . donepezil  5 mg Oral QHS  . furosemide  20 mg Oral BID  . glimepiride  4 mg Oral BID  . influenza  inactive virus vaccine  0.5 mL Intramuscular Tomorrow-1000  . insulin aspart  0-15 Units Subcutaneous TID WC  . insulin aspart  0-5 Units Subcutaneous QHS  . insulin glargine  35 Units Subcutaneous BID  . levothyroxine  100 mcg Oral QAC breakfast  . metoprolol  25 mg Oral BID  . pantoprazole  40 mg Oral Daily  . vancomycin  1,500 mg Intravenous Q24H  . vancomycin  1,500 mg Intravenous Once  .  cyanocobalamin  1,000 mcg Oral Daily  . Vitamin D  2,000 Units Oral Daily  . DISCONTD: sodium chloride   Intravenous STAT  . DISCONTD: aspirin  81 mg Oral Daily  . DISCONTD: levothyroxine  100 mcg Oral Daily  . DISCONTD: metoprolol  25 mg Oral BID  . DISCONTD: vancomycin  1,500 mg Intravenous To ER   Continuous:    . sodium chloride 40 mL/hr at 05/02/12 1610     Assessment/Plan: Principal Problem:  *Cellulitis and abscess  1. Sig R Thigh CELLULITIS AND ABSCESS OF LEG EXCEPT FOOT (ICD-682.6) and worsening Leukocytosis.  Continue Vanco and Clinda and CCS following.  I am still suspicious that she will probably need I and D in the OR at some point.  Continue Hot Packs. She will need to be re-educated on Insulin Administration. Follow labs. ? Whether CT or [place] may help in W/up.   2.  CAD/NQWMI/3vCABG 06/2009/ Mild CM c EF 50% Dr [place]. No Angina.  3 H/O AFib/Flutter - No current issue  4. Hypothyroidism on meds  5. Chronic Anemia - Multifactorial. At 11.4 - 10.7 = stable  6. MGUS - Dr Arline Asp,  7. OA esp L Knee per Dr Lequita Halt,  8. LE Edema/CVI/venous Stasis - on diuretics,  9 Obesity - needs weight loss  10. HTN - Stable. 12. Cognitive decline and developing Dementia on Aricept.   13. DM2 with Neuropathy/Non-Prolif DM Retinopathy/Cr 1.3-1.8 without microalbuminuria - Lantus and ISS - CBGs better in hospital than at home. Cr 1.5 and stable  Basename 05/02/12 0759 05/01/12 2150  GLUCAP 186* 280*   ID -  Anti-infectives     Start     Dose/Rate Route Frequency Ordered Stop   05/02/12 2000   vancomycin (VANCOCIN) 1,500 mg in sodium chloride 0.9 % 500 mL IVPB        1,500 mg 250 mL/hr over 120 Minutes Intravenous Every 24 hours 05/01/12 1931     05/01/12 2200   clindamycin (CLEOCIN) IVPB 900 mg        900 mg 100 mL/hr over 30 Minutes Intravenous 3 times per day 05/01/12 1825     05/01/12 2000   vancomycin (VANCOCIN) 1,500 mg in sodium chloride 0.9 % 500 mL IVPB        1,500 mg 250 mL/hr over 120 Minutes Intravenous  Once 05/01/12 1933 05/01/12 2205   05/01/12 1630   vancomycin (VANCOCIN) 1,500 mg in sodium chloride 0.9 % 500 mL IVPB  Status:  Discontinued        1,500 mg 250 mL/hr over 120 Minutes Intravenous To Emergency Dept 05/01/12 1604 05/01/12 1934   05/01/12 1515   clindamycin (CLEOCIN) IVPB 900 mg        900 mg 100 mL/hr over 30 Minutes Intravenous  Once 05/01/12 1511 05/01/12 1614         DVT Prophylaxis - SCDs.    LOS: 1 day   Mitzy Naron M 05/02/2012, 8:25 AM

## 2012-05-02 NOTE — Progress Notes (Signed)
Inpatient Diabetes Program Recommendations  AACE/ADA: New Consensus Statement on Inpatient Glycemic Control (2013)  Target Ranges:  Prepandial:   less than 140 mg/dL      Peak postprandial:   less than 180 mg/dL (1-2 hours)      Critically ill patients:  140 - 180 mg/dL   Reason for Visit: Hyperglycemia Inpatient Diabetes Program Recommendations Insulin - Meal Coverage: Please add at least 4 units tidwc.  (Pt takes 50 units Humalog tidwc at home). Oral Agents: Please do not use Amaryl in the hospital.  Beta cells already stressed with infection, and it does not seem to be effective anyway.  PO intake may be variable as well. HgbA1C: Please check present value (last one in April was 10.6% Diet: Added carbohydrate modified to diet orders (heart healthy diet alone has more carbohydrate content than regular diet).  Note: Thank you, Lenor Coffin, RN, CNS, Diabetes Coordinator [phone])

## 2012-05-02 NOTE — Care Management Note (Signed)
    Page 1 of 2   05/11/2012     10:33:53 AM   CARE MANAGEMENT NOTE 05/11/2012  Patient:  [name], [name]   Account Number:  [id number]  Date Initiated:  05/02/2012  Documentation initiated by:  Letha Cape  Subjective/Objective Assessment:   dx cellulitis/abscess  admit- lives with spouse.     Action/Plan:   Anticipated DC Date:  05/11/2012   Anticipated DC Plan:  HOME W HOME HEALTH SERVICES      DC Planning Services  CM consult      [facility] Choice  HOME HEALTH   Choice offered to / List presented to:  C-1 Patient        HH arranged  HH-1 RN  HH-2 PT  HH-4 NURSE'S AIDE      HH agency  [facility]   Status of service:  Completed, signed off Medicare Important Message given?   (If response is "NO", the following Medicare IM given date fields will be blank) Date Medicare IM given:   Date Additional Medicare IM given:    Discharge Disposition:  HOME W HOME HEALTH SERVICES  Per UR Regulation:  Reviewed for med. necessity/level of care/duration of stay  If discussed at Long Length of Stay Meetings, dates discussed:    Comments:  05/11/12 10:32 Letha Cape RN, BSN [phone] patient for dc to home today with hh services, [facility] notified.  05/10/12 11:41 Letha Cape RN,BSN [phone] spoke with patient's husband, he states he is not ready for patient to come home today,  CCS has signed off, he has to get the house prepared for her to come home so she can be comfortable, and also he will not be able to ast in the dressing changes and the daughter will faint at the sight of blood.  The husband states that his son is in the medical field and he would be the one who could change the dressings and he is leaving for vacation today for a week. I informed the husband that we spoke with them earlier about snf and they chose not to go to snf but to go home with hh.  The husband states the he will be ready for patient to come home tomorrow ,just not today.  I informed him I  would let Dr. Timothy Lasso know what his concerns are and I will get back with him after I talk with Dr. Timothy Lasso.  05/09/12 15:06 Letha Cape RN, BSN [phone] patient is post op day 2 of i and d conts on iv abx and wound care, will need hhrn and pt, patient chose [facility] , referral made to [facility], Lupita Leash notified.  Soc will begin 24-48 hrs post discharge.  Per MD patient is not ready for dc today.  05/03/12 15:54 Letha Cape RN, BSN [phone] no surgical intervention needed, cont iv abx until mpain has improved, then convert to po.  05/02/12 15:04 Letha Cape RN, BSN [phone] patient lives with spouse, pta independent.  Patient has medication coverage and transportation.  Patient may need HHRN if need wound care.  NCM will continue to follow for dc needs.

## 2012-05-02 NOTE — Progress Notes (Signed)
Patient ID: [name], female   DOB: 04-13-1933, 78 y.o.   MRN: [id number]    Subjective: Reports groin is sore but better then yesterday, no other complaints  Objective: Vital signs in last 24 hours: Temp:  [98 F (36.7 C)-101.5 F (38.6 C)] 98.8 F (37.1 C) (10/30 0643) Pulse Rate:  [76-98] 76  (10/30 0445) Resp:  [16-24] 18  (10/30 0445) BP: (124-156)/(40-72) 124/54 mmHg (10/30 0445) SpO2:  [94 %-99 %] 99 % (10/30 0445) Weight:  [218 lb 7.6 oz (99.1 kg)-223 lb 1.7 oz (101.2 kg)] 223 lb 1.7 oz (101.2 kg) (10/30 0445) Last BM Date: 05/01/12  Intake/Output from previous day: 10/29 0701 - 10/30 0700 In: 1160 [I.V.:1160] Out: -  Intake/Output this shift:    PE: Ext: right groin with hard indurated area but no fluctuant, drainable area, erythema present  Lab Results:   Basename 05/02/12 0630 05/01/12 1434  WBC 14.1* 13.4*  HGB 10.7* 11.4*  HCT 33.7* 34.9*  PLT 140* 158   BMET  Basename 05/02/12 0630 05/01/12 1434  NA 134* 135  K 3.7 4.1  CL 96 95*  CO2 26 28  GLUCOSE 182* 345*  BUN 20 20  CREATININE 1.58* 1.52*  CALCIUM 8.6 8.9   PT/INR  Basename 05/02/12 0630  LABPROT 14.5  INR 1.15   CMP     Component Value Date/Time   NA 134* 05/02/2012 0630   K 3.7 05/02/2012 0630   CL 96 05/02/2012 0630   CO2 26 05/02/2012 0630   GLUCOSE 182* 05/02/2012 0630   BUN 20 05/02/2012 0630   CREATININE 1.58* 05/02/2012 0630   CREATININE 1.68* 05/16/2011 1352   CALCIUM 8.6 05/02/2012 0630   PROT 7.5 05/02/2012 0630   ALBUMIN 2.9* 05/02/2012 0630   AST 22 05/02/2012 0630   ALT 16 05/02/2012 0630   ALKPHOS 88 05/02/2012 0630   BILITOT 0.8 05/02/2012 0630   GFRNONAA 30* 05/02/2012 0630   GFRAA 35* 05/02/2012 0630   Lipase  No results found for this basename: lipase       Studies/Results: No results found.  Anti-infectives: Anti-infectives     Start     Dose/Rate Route Frequency Ordered Stop   05/02/12 2000   vancomycin (VANCOCIN) 1,500 mg in sodium  chloride 0.9 % 500 mL IVPB        1,500 mg 250 mL/hr over 120 Minutes Intravenous Every 24 hours 05/01/12 1931     05/01/12 2200   clindamycin (CLEOCIN) IVPB 900 mg        900 mg 100 mL/hr over 30 Minutes Intravenous 3 times per day 05/01/12 1825     05/01/12 2000   vancomycin (VANCOCIN) 1,500 mg in sodium chloride 0.9 % 500 mL IVPB        1,500 mg 250 mL/hr over 120 Minutes Intravenous  Once 05/01/12 1933 05/01/12 2205   05/01/12 1630   vancomycin (VANCOCIN) 1,500 mg in sodium chloride 0.9 % 500 mL IVPB  Status:  Discontinued        1,500 mg 250 mL/hr over 120 Minutes Intravenous To Emergency Dept 05/01/12 1604 05/01/12 1934   05/01/12 1515   clindamycin (CLEOCIN) IVPB 900 mg        900 mg 100 mL/hr over 30 Minutes Intravenous  Once 05/01/12 1511 05/01/12 1614           Assessment/Plan  1.  Right groin cellulitis:  No drainable area present, no surgical intervention needed at this time.  Recommend continue IV abx  until pain much improved then convert to PO.  Will sign off at this time.  Available if worsens.    LOS: 1 day    Seybold, Jaquis Picklesimer 05/02/2012

## 2012-05-03 ENCOUNTER — Ambulatory Visit: Payer: Medicare Other

## 2012-05-03 ENCOUNTER — Inpatient Hospital Stay (HOSPITAL_COMMUNITY): Payer: Medicare Other

## 2012-05-03 LAB — BASIC METABOLIC PANEL
CO2: 27 mEq/L (ref 19–32)
Glucose, Bld: 133 mg/dL — ABNORMAL HIGH (ref 70–99)
Potassium: 3.7 mEq/L (ref 3.5–5.1)
Sodium: 137 mEq/L (ref 135–145)

## 2012-05-03 LAB — CBC
Hemoglobin: 10.4 g/dL — ABNORMAL LOW (ref 12.0–15.0)
MCH: 30.8 pg (ref 26.0–34.0)
MCV: 92.3 fL (ref 78.0–100.0)
RBC: 3.38 MIL/uL — ABNORMAL LOW (ref 3.87–5.11)

## 2012-05-03 LAB — GLUCOSE, CAPILLARY
Glucose-Capillary: 115 mg/dL — ABNORMAL HIGH (ref 70–99)
Glucose-Capillary: 205 mg/dL — ABNORMAL HIGH (ref 70–99)
Glucose-Capillary: 161 mg/dL — ABNORMAL HIGH (ref 70–99)

## 2012-05-03 LAB — HEMOGLOBIN A1C: Hgb A1c MFr Bld: 10 % — ABNORMAL HIGH (ref <5.7)

## 2012-05-03 MED ORDER — INSULIN ASPART 100 UNIT/ML ~~LOC~~ SOLN
4.0000 [IU] | Freq: Three times a day (TID) | SUBCUTANEOUS | Status: DC
  Administered 2012-05-03 (×3): 4 [IU] via SUBCUTANEOUS

## 2012-05-03 MED ORDER — INSULIN GLARGINE 100 UNIT/ML ~~LOC~~ SOLN
36.0000 [IU] | Freq: Two times a day (BID) | SUBCUTANEOUS | Status: DC
  Administered 2012-05-03: 36 [IU] via SUBCUTANEOUS

## 2012-05-03 MED ORDER — IOHEXOL 300 MG/ML  SOLN
80.0000 mL | Freq: Once | INTRAMUSCULAR | Status: AC | PRN
  Administered 2012-05-03: 80 mL via INTRAVENOUS

## 2012-05-03 MED ORDER — INSULIN GLARGINE 100 UNIT/ML ~~LOC~~ SOLN: 40.0000 [IU] | Freq: Two times a day (BID) | SUBCUTANEOUS | Status: DC

## 2012-05-03 NOTE — Progress Notes (Signed)
Subjective: Admitted with R groin Cellulitis - ? Abscess. Appreciate Gen Surgery input. Remains on IV Abx. Tm 99.7 No new C/O.  Sitting in chair.  Woke up with sweats Tender to touch  Objective: Vital signs in last 24 hours: Temp:  [98.2 F (36.8 C)-101.7 F (38.7 C)] 99.7 F (37.6 C) (10/31 0611) Pulse Rate:  [75-83] 75  (10/31 0611) Resp:  [18] 18  (10/31 0611) BP: (114-131)/(41-57) 114/49 mmHg (10/31 0611) SpO2:  [95 %-97 %] 95 % (10/31 0611) Weight:  [100.4 kg (221 lb 5.5 oz)] 100.4 kg (221 lb 5.5 oz) (10/31 0527) Weight change: 1.3 kg (2 lb 13.9 oz) Last BM Date: 05/02/12  CBG (last 3)   Basename 05/02/12 2218 05/02/12 1734 05/02/12 1206  GLUCAP 242* 269* 269*    Intake/Output from previous day:  Intake/Output Summary (Last 24 hours) at 05/03/12 0750 Last data filed at 05/03/12 0021  Gross per 24 hour  Intake 1585.33 ml  Output      0 ml  Net 1585.33 ml   10/30 0701 - 10/31 0700 In: 1585.3 [P.O.:720; I.V.:665.3; IV Piggyback:200] Out: -    Physical Exam  General appearance: Alert  Eyes: no scleral icterus Throat: oropharynx moist without erythema Resp: CTA Cardio: Reg Sternotomy GI: soft, non-tender; bowel sounds normal; no masses,  no organomegaly R groin tender and warm.  Fluid collection. Extremities: no clubbing, cyanosis or edema   Lab Results:  Basename 05/02/12 0630 05/01/12 1434  NA 134* 135  K 3.7 4.1  CL 96 95*  CO2 26 28  GLUCOSE 182* 345*  BUN 20 20  CREATININE 1.58* 1.52*  CALCIUM 8.6 8.9  MG -- --  PHOS -- --     Basename 05/02/12 0630 05/01/12 1434  AST 22 26  ALT 16 17  ALKPHOS 88 98  BILITOT 0.8 0.8  PROT 7.5 8.1  ALBUMIN 2.9* 3.2*     Basename 05/02/12 0630 05/01/12 1434  WBC 14.1* 13.4*  NEUTROABS -- 9.8*  HGB 10.7* 11.4*  HCT 33.7* 34.9*  MCV 94.9 93.8  PLT 140* 158    Lab Results  Component Value Date   INR 1.15 05/02/2012   INR 1.79* 10/25/2011   INR 2.24* 10/24/2011    No results found for this  basename: CKTOTAL:3,CKMB:3,CKMBINDEX:3,TROPONINI:3 in the last 72 hours  No results found for this basename: TSH,T4TOTAL,FREET3,T3FREE,THYROIDAB in the last 72 hours  No results found for this basename: VITAMINB12:2,FOLATE:2,FERRITIN:2,TIBC:2,IRON:2,RETICCTPCT:2 in the last 72 hours  Micro Results: No results found for this or any previous visit (from the past 240 hour(s)).   Studies/Results: No results found.   Medications: Scheduled:    . amitriptyline  75 mg Oral QHS  . aspirin  81 mg Oral Daily  . atorvastatin  40 mg Oral QPM  . clindamycin (CLEOCIN) IV  900 mg Intravenous Q8H  . donepezil  5 mg Oral QHS  . furosemide  20 mg Oral BID  . glimepiride  4 mg Oral BID  . influenza  inactive virus vaccine  0.5 mL Intramuscular Tomorrow-1000  . insulin aspart  0-15 Units Subcutaneous TID WC  . insulin aspart  0-5 Units Subcutaneous QHS  . insulin glargine  35 Units Subcutaneous BID  . levothyroxine  100 mcg Oral QAC breakfast  . metoprolol  25 mg Oral BID  . pantoprazole  40 mg Oral Daily  . vancomycin  1,500 mg Intravenous Q24H  . cyanocobalamin  1,000 mcg Oral Daily  . Vitamin D  2,000 Units Oral Daily  Continuous:    . sodium chloride 40 mL/hr at 05/02/12 2007     Assessment/Plan: Principal Problem:  *Cellulitis and abscess  1. Sig R Thigh CELLULITIS AND ABSCESS OF LEG EXCEPT FOOT (ICD-682.6) with Leukocytosis and fever.  Continue Vanco and Clinda. There has been improvement in R groin since Tuesday.  CCS signed off.  I am still suspicious that she will probably need I and D in the OR at some point and will get non contrast CT of site.  Continue Hot Packs. She will need to be re-educated on Insulin Administration. Follow labs.  Blood Cx not done this admit and if repeat fever - will get  2. CAD/NQWMI/3vCABG 06/2009/ Mild CM c EF 50% Dr [place]. No Angina.  3 H/O AFib/Flutter - No current issue  4. Hypothyroidism on meds  5. Chronic Anemia - Multifactorial. At 11.4 -  10.7 = stable. Repeat CBC today 6. MGUS - Dr Arline Asp,  7. OA esp L Knee per Dr Lequita Halt,  8. LE Edema/CVI/venous Stasis - on diuretics,  9 Obesity - needs weight loss  10. HTN - Stable. 12. Cognitive decline and developing Dementia on Aricept.   13. DM2 with Neuropathy/Non-Prolif DM Retinopathy/Cr 1.3-1.8 without microalbuminuria - Lantus - increase to 40 BID and ISS - CBGs better in hospital than at home. Cr 1.5 and stable.  Will add 4 units TIDwc as per DM RN  [facility] 05/02/12 2218 05/02/12 1734 05/02/12 1206  GLUCAP 242* 269* 269*   ID -  Anti-infectives     Start     Dose/Rate Route Frequency Ordered Stop   05/02/12 2000   vancomycin (VANCOCIN) 1,500 mg in sodium chloride 0.9 % 500 mL IVPB        1,500 mg 250 mL/hr over 120 Minutes Intravenous Every 24 hours 05/01/12 1931     05/01/12 2200   clindamycin (CLEOCIN) IVPB 900 mg        900 mg 100 mL/hr over 30 Minutes Intravenous 3 times per day 05/01/12 1825     05/01/12 2000   vancomycin (VANCOCIN) 1,500 mg in sodium chloride 0.9 % 500 mL IVPB        1,500 mg 250 mL/hr over 120 Minutes Intravenous  Once 05/01/12 1933 05/01/12 2205   05/01/12 1630   vancomycin (VANCOCIN) 1,500 mg in sodium chloride 0.9 % 500 mL IVPB  Status:  Discontinued        1,500 mg 250 mL/hr over 120 Minutes Intravenous To Emergency Dept 05/01/12 1604 05/01/12 1934   05/01/12 1515   clindamycin (CLEOCIN) IVPB 900 mg        900 mg 100 mL/hr over 30 Minutes Intravenous  Once 05/01/12 1511 05/01/12 1614         DVT Prophylaxis - SCDs.    LOS: 2 days   Honestee Revard M 05/03/2012, 7:50 AM

## 2012-05-03 NOTE — Plan of Care (Signed)
Problem: Phase I Progression Outcomes Goal: Initial discharge plan identified Outcome: Completed/Met Date Met:  05/03/12 To return home

## 2012-05-04 ENCOUNTER — Other Ambulatory Visit: Payer: Medicare Other | Admitting: Lab

## 2012-05-04 ENCOUNTER — Ambulatory Visit: Payer: Medicare Other

## 2012-05-04 ENCOUNTER — Inpatient Hospital Stay (HOSPITAL_COMMUNITY): Payer: Medicare Other

## 2012-05-04 LAB — BASIC METABOLIC PANEL
GFR calc non Af Amer: 27 mL/min — ABNORMAL LOW (ref >90)
Glucose, Bld: 63 mg/dL — ABNORMAL LOW (ref 70–99)
Potassium: 3.3 mEq/L — ABNORMAL LOW (ref 3.5–5.1)
Sodium: 135 mEq/L (ref 135–145)

## 2012-05-04 LAB — CBC
Hemoglobin: 9.8 g/dL — ABNORMAL LOW (ref 12.0–15.0)
MCH: 30.4 pg (ref 26.0–34.0)
RBC: 3.22 MIL/uL — ABNORMAL LOW (ref 3.87–5.11)
WBC: 13.7 10*3/uL — ABNORMAL HIGH (ref 4.0–10.5)

## 2012-05-04 LAB — GLUCOSE, CAPILLARY
Glucose-Capillary: 54 mg/dL — ABNORMAL LOW (ref 70–99)
Glucose-Capillary: 107 mg/dL — ABNORMAL HIGH (ref 70–99)
Glucose-Capillary: 116 mg/dL — ABNORMAL HIGH (ref 70–99)
Glucose-Capillary: 179 mg/dL — ABNORMAL HIGH (ref 70–99)
Glucose-Capillary: 145 mg/dL — ABNORMAL HIGH (ref 70–99)

## 2012-05-04 MED ORDER — INSULIN GLARGINE 100 UNIT/ML ~~LOC~~ SOLN
32.0000 [IU] | Freq: Two times a day (BID) | SUBCUTANEOUS | Status: DC
  Administered 2012-05-04: 32 [IU] via SUBCUTANEOUS

## 2012-05-04 MED ORDER — RISAQUAD PO CAPS
1.0000 | ORAL_CAPSULE | Freq: Every day | ORAL | Status: DC
  Administered 2012-05-04 – 2012-05-11 (×8): 1 via ORAL
  Filled 2012-05-04 (×8): qty 1

## 2012-05-04 MED ORDER — GUAIFENESIN ER 600 MG PO TB12
600.0000 mg | ORAL_TABLET | Freq: Two times a day (BID) | ORAL | Status: DC | PRN
  Administered 2012-05-04: 600 mg via ORAL
  Filled 2012-05-04: qty 1

## 2012-05-04 MED ORDER — LIDOCAINE 5 % EX OINT
TOPICAL_OINTMENT | Freq: Two times a day (BID) | CUTANEOUS | Status: DC | PRN
  Administered 2012-05-04 – 2012-05-07 (×2): via TOPICAL
  Filled 2012-05-04: qty 35.44

## 2012-05-04 MED ORDER — POTASSIUM CHLORIDE CRYS ER 20 MEQ PO TBCR
40.0000 meq | EXTENDED_RELEASE_TABLET | Freq: Once | ORAL | Status: AC
  Administered 2012-05-04: 40 meq via ORAL

## 2012-05-04 MED ORDER — POTASSIUM CHLORIDE CRYS ER 20 MEQ PO TBCR
40.0000 meq | EXTENDED_RELEASE_TABLET | Freq: Two times a day (BID) | ORAL | Status: DC
  Filled 2012-05-04: qty 2

## 2012-05-04 MED ORDER — INSULIN ASPART 100 UNIT/ML ~~LOC~~ SOLN
2.0000 [IU] | Freq: Three times a day (TID) | SUBCUTANEOUS | Status: DC
  Administered 2012-05-04: 2 [IU] via SUBCUTANEOUS

## 2012-05-04 NOTE — Progress Notes (Signed)
CRITICAL VALUE ALERT  Critical value received:  BS-54    Date of notification:  05/04/12  Time of notification:  0745  Critical value read back:yes  Nurse who received alert:  jim  MD notified (1st page):  DR Creola Corn  Time of first page:  [phone]  MD notified (2nd page):  Time of second page:  Responding MD:  Dr Creola Corn  Time MD responded:  [phone]

## 2012-05-04 NOTE — Progress Notes (Signed)
Subjective: Admitted yesterday with R groin Cellulitis - ? Abscess. Appreciate Gen Surgery input. Remains on IV Abx. Sensitive and Sore. CT confirmed - No Abscess. No new C/O Tender to touch CBG Low this am at 54 and getting OJ  Objective: Vital signs in last 24 hours: Temp:  [97.3 F (36.3 C)-101.8 F (38.8 C)] 100 F (37.8 C) (11/01 0426) Pulse Rate:  [69-93] 93  (11/01 0426) Resp:  [18-24] 24  (11/01 0426) BP: (127-138)/(42-68) 131/67 mmHg (11/01 0426) SpO2:  [94 %-98 %] 97 % (11/01 0426) Weight:  [102.6 kg (226 lb 3.1 oz)-102.7 kg (226 lb 6.6 oz)] 102.6 kg (226 lb 3.1 oz) (11/01 0500) Weight change: 2.3 kg (5 lb 1.1 oz) Last BM Date: 05/02/12  CBG (last 3)   Basename 05/04/12 0739 05/03/12 2157 05/03/12 1737  GLUCAP 54* 161* 205*    Intake/Output from previous day:  Intake/Output Summary (Last 24 hours) at 05/04/12 0750 Last data filed at 05/03/12 1816  Gross per 24 hour  Intake 911.33 ml  Output      0 ml  Net 911.33 ml   10/31 0701 - 11/01 0700 In: 911.3 [P.O.:598; I.V.:313.3] Out: -    Physical Exam  General appearance: Alert  Eyes: no scleral icterus Throat: oropharynx moist without erythema Resp: CTA Cardio: Reg Sternotomy GI: soft, non-tender; bowel sounds normal; no masses,  no organomegaly R groin tender and warm c cellulitis.  There is becoming a central area of necrosis @ the Fluid collection.  There is clear drainage.  No Odor Extremities: no clubbing, cyanosis or edema   Lab Results:  [facility] 05/03/12 0853 05/02/12 0630  NA 137 134*  K 3.7 3.7  CL 100 96  CO2 27 26  GLUCOSE 133* 182*  BUN 19 20  CREATININE 1.51* 1.58*  CALCIUM 8.6 8.6  MG -- --  PHOS -- --     Basename 05/02/12 0630 05/01/12 1434  AST 22 26  ALT 16 17  ALKPHOS 88 98  BILITOT 0.8 0.8  PROT 7.5 8.1  ALBUMIN 2.9* 3.2*     Basename 05/03/12 0853 05/02/12 0630 05/01/12 1434  WBC 12.3* 14.1* --  NEUTROABS -- -- 9.8*  HGB 10.4* 10.7* --  HCT 31.2* 33.7*  --  MCV 92.3 94.9 --  PLT 159 140* --    Lab Results  Component Value Date   INR 1.15 05/02/2012   INR 1.79* 10/25/2011   INR 2.24* 10/24/2011    No results found for this basename: CKTOTAL:3,CKMB:3,CKMBINDEX:3,TROPONINI:3 in the last 72 hours  No results found for this basename: TSH,T4TOTAL,FREET3,T3FREE,THYROIDAB in the last 72 hours  No results found for this basename: VITAMINB12:2,FOLATE:2,FERRITIN:2,TIBC:2,IRON:2,RETICCTPCT:2 in the last 72 hours  Micro Results: No results found for this or any previous visit (from the past 240 hour(s)).   Studies/Results: Ct Pelvis W Contrast  05/03/2012  *RADIOLOGY REPORT*  Clinical Data:  Right groin cellulitis. Tenderness to touch. Warmth.  CT PELVIS WITH CONTRAST  Technique:  Multidetector CT imaging of the pelvis was performed using the standard protocol following the bolus administration of intravenous contrast.  Contrast: 80mL OMNIPAQUE IOHEXOL 300 MG/ML  SOLN  Comparison:   None.  Findings:  There is soft tissue stranding in the subcutaneous fat of the anterior aspect of the proximal right thigh.  There is no abscess.  No significant adenopathy in the right inguinal region. The area of maximum involvement is approximately 6 cm in diameter.  The underlying muscle structures and bones are normal.  Note is  made of numerous diverticula in the colon.  Uterus and ovaries been removed.  Incidental note is made of multiple tiny stones in the gallbladder.  Minimal arthritic changes at both hips.  IMPRESSION: Focal slight cellulitis of the subcutaneous fat of the anterior aspect of the proximal right thigh.  No abscess or adenopathy.  Air is seen in the soft tissues of the anterior aspect of both thighs, probably related to subcutaneous injections.   Original Report Authenticated By: Francene Boyers, M.D.      Medications: Scheduled:    . acidophilus  1 capsule Oral Daily  . amitriptyline  75 mg Oral QHS  . aspirin  81 mg Oral Daily  . atorvastatin   40 mg Oral QPM  . clindamycin (CLEOCIN) IV  900 mg Intravenous Q8H  . donepezil  5 mg Oral QHS  . furosemide  20 mg Oral BID  . glimepiride  4 mg Oral BID  . insulin aspart  0-15 Units Subcutaneous TID WC  . insulin aspart  0-5 Units Subcutaneous QHS  . insulin aspart  4 Units Subcutaneous TID WC  . insulin glargine  36 Units Subcutaneous BID  . levothyroxine  100 mcg Oral QAC breakfast  . metoprolol  25 mg Oral BID  . pantoprazole  40 mg Oral Daily  . vancomycin  1,500 mg Intravenous Q24H  . cyanocobalamin  1,000 mcg Oral Daily  . Vitamin D  2,000 Units Oral Daily  . DISCONTD: insulin glargine  35 Units Subcutaneous BID  . DISCONTD: insulin glargine  40 Units Subcutaneous BID   Continuous:    . sodium chloride 40 mL/hr at 05/04/12 1478     Assessment/Plan: Principal Problem:  *Cellulitis and abscess  1. Sig R Thigh CELLULITIS AND ABSCESS OF LEG EXCEPT FOOT (ICD-682.6).  Continue Vanco and Clinda  And switch to oral Abx over the weekend with possible D/C Sunday into monday.  She may still need I and D at some point although CT was (-) for Abscess.  Continue Hot Packs. She will need to be re-educated on Insulin Administration. Follow labs. I ordered Wound care consult to see if anything topically may help.  2. CAD/NQWMI/3vCABG 06/2009/ Mild CM c EF 50% Dr Jordan. No Angina.  3 H/O AFib/Flutter - No current issue  4. Hypothyroidism on meds  5. Chronic Anemia - Multifactorial. At 11.4 - 10.7 = stable  6. MGUS - Dr Murinson,  7. OA esp L Knee per Dr Aluisio,  8. LE Edema/CVI/venous Stasis - on diuretics,  9 Obesity - needs weight loss  10. HTN - Stable. 12. Cognitive decline and developing Dementia on Aricept.   13. DM2 with Neuropathy/Non-Prolif DM Retinopathy/Cr 1.3-1.8 without microalbuminuria - A1C = 10. Lantus and ISS - CBGs better in hospital than at home. Cr 1.5 and stable. Some lows - Adjust again.  Basename 05/04/12 0739 05/03/12 2157 05/03/12 1737  GLUCAP 54* 161*  205*   ID -  Anti-infectives     Start     Dose/Rate Route Frequency Ordered Stop   05/02/12 2000   vancomycin (VANCOCIN) 1,500 mg in sodium chloride 0.9 % 500 mL IVPB        1,500 mg 250 mL/hr over 120 Minutes Intravenous Every 24 hours 05/01/12 1931     05/01/12 2200   clindamycin (CLEOCIN) IVPB 900 mg        900 mg 100 mL/hr over 30 Minutes Intravenous 3 times per day 05/01/12 1825     10 /29/13 2000  vancomycin (VANCOCIN) 1,500 mg in sodium chloride 0.9 % 500 mL IVPB        1,500 mg 250 mL/hr over 120 Minutes Intravenous  Once 05/01/12 1933 05/01/12 2205   05/01/12 1630   vancomycin (VANCOCIN) 1,500 mg in sodium chloride 0.9 % 500 mL IVPB  Status:  Discontinued        1,500 mg 250 mL/hr over 120 Minutes Intravenous To Emergency Dept 05/01/12 1604 05/01/12 1934   05/01/12 1515   clindamycin (CLEOCIN) IVPB 900 mg        900 mg 100 mL/hr over 30 Minutes Intravenous  Once 05/01/12 1511 05/01/12 1614         DVT Prophylaxis - SCDs.    LOS: 3 days   Holle Sprick M 05/04/2012, 7:50 AM

## 2012-05-04 NOTE — Progress Notes (Signed)
ANTIBIOTIC CONSULT NOTE - Follow-up  Pharmacy Consult for vancomycin Indication: groin cellulitis/?abscess  Allergies  Allergen Reactions  . Betadine (Povidone Iodine) Itching  . Capoten (Captopril) Other (See Comments)    unknown  . Darvocet (Propoxyphene-Acetaminophen) Other (See Comments)    unknown  . Penicillins Swelling    Patient Measurements: Height: 5\' 3"  (160 cm) Weight: 226 lb 3.1 oz (102.6 kg) IBW/kg (Calculated) : 52.4  Adjusted Body Weight:   Vital Signs: Temp: 100 F (37.8 C) (11/01 0426) Temp src: Oral (11/01 0426) BP: 131/67 mmHg (11/01 0426) Pulse Rate: 93  (11/01 0426) Intake/Output from previous day: 10/31 0701 - 11/01 0700 In: 911.3 [P.O.:598; I.V.:313.3] Out: -  Intake/Output from this shift:    Labs:  Basename 05/04/12 0650 05/03/12 0853 05/02/12 0630  WBC 13.7* 12.3* 14.1*  HGB 9.8* 10.4* 10.7*  PLT 179 159 140*  LABCREA -- -- --  CREATININE 1.72* 1.51* 1.58*   Estimated Creatinine Clearance: 31.3 ml/min (by C-G formula based on Cr of 1.72). No results found for this basename: VANCOTROUGH:2,VANCOPEAK:2,VANCORANDOM:2,GENTTROUGH:2,GENTPEAK:2,GENTRANDOM:2,TOBRATROUGH:2,TOBRAPEAK:2,TOBRARND:2,AMIKACINPEAK:2,AMIKACINTROU:2,AMIKACIN:2, in the last 72 hours   Microbiology: No results found for this or any previous visit (from the past 720 hour(s)).  Medications:  Amitriptyline, asa, lipitor, vitamin D, cyanocobalamin, donepezil, nexium, lovaza, lasix, glimepiride, lantus, humalog, iron, synthroid, magnesium, metoprolol,   Assessment: 79 yof presented to the ED with groin wound/swelling. Pt is febrile with a Tmax of 101.8 and elevated WBC of 13.7. Scr has increased a bit. Looks like IV abx will continue through the weekend with plans to change to PO.   Vanc 10/29>> Clinda 10/29>>  Goal of Therapy:  Vancomycin trough level 10-15 mcg/ml  Plan:  1. Continue Vancomycin 1500mg  IV Q24H - watch SCr and may need to adjust dose 2. F/u renal fxn,  C&S, clinical status and trough at SS 3. Continue clindamycin per MD  [name], [name] 05/04/2012,9:00 AM

## 2012-05-04 NOTE — Progress Notes (Signed)
PT Cancellation Note  Patient Details Name: [name] MRN: [id number] DOB: 09-21-1932   Cancelled Treatment:    Reason Eval/Treat Not Completed: Medical issues which prohibited therapy (K 3.3 without repletion, RN aware) Will check back as time allows   Delaney Meigs, PT [phone]

## 2012-05-04 NOTE — Consult Note (Signed)
WOC consult Note Reason for Consult: right groin cellulitis, surgery no recommendations for intervention at this time.  Asked to eval for topical care. Area is extremely tender to my touch and any manipulation of the wound. Wound type:cellulitis right groin, possibly related to poor technique with insulin adm. Measurement: 12x8 area of induration and erythema with center darkened area that is 4x4cm   Wound bed: wound is not currently open Drainage (amount, consistency, odor) no odor, not able to express in drainage at this time.  Dressing procedure/placement/frequency: No topical dressing recommended at this time, due to her level of pain, feel any type of dressing would cause more discomfort than benefit.  Pt may benefit from lidocaine gel if MD agrees for comfort.  Re consult if needed, will not follow at this time. Thanks  Naveyah Iacovelli [employer], CWOCN [phone])

## 2012-05-05 LAB — BASIC METABOLIC PANEL
Calcium: 8.8 mg/dL (ref 8.4–10.5)
Chloride: 100 mEq/L (ref 96–112)
Creatinine, Ser: 1.82 mg/dL — ABNORMAL HIGH (ref 0.50–1.10)
GFR calc Af Amer: 30 mL/min — ABNORMAL LOW (ref >90)
GFR calc non Af Amer: 26 mL/min — ABNORMAL LOW (ref >90)

## 2012-05-05 LAB — CBC
Hemoglobin: 10.1 g/dL — ABNORMAL LOW (ref 12.0–15.0)
MCH: 30 pg (ref 26.0–34.0)
MCHC: 32.7 g/dL (ref 30.0–36.0)
RDW: 14.2 % (ref 11.5–15.5)

## 2012-05-05 LAB — GLUCOSE, CAPILLARY: Glucose-Capillary: 133 mg/dL — ABNORMAL HIGH (ref 70–99)

## 2012-05-05 MED ORDER — INSULIN GLARGINE 100 UNIT/ML ~~LOC~~ SOLN
25.0000 [IU] | Freq: Two times a day (BID) | SUBCUTANEOUS | Status: DC
  Administered 2012-05-05 (×2): 25 [IU] via SUBCUTANEOUS
  Administered 2012-05-06: 20 [IU] via SUBCUTANEOUS

## 2012-05-05 NOTE — Progress Notes (Signed)
Subjective: Patient is seen today bright alert conversant just finished breakfast. No nausea vomiting. She does perceive that the right groin infection is improved at least clinically based upon her pain. She has been out of bed some. No gym Dr. Timothy Lasso in general surgery are noted. She does continue on antibiotics.  Objective: Vital signs in last 24 hours: Temp:  [98.3 F (36.8 C)-99 F (37.2 C)] 99 F (37.2 C) (11/02 0618) Pulse Rate:  [73-84] 82  (11/02 0618) Resp:  [18-20] 18  (11/02 0618) BP: (114-132)/(68-80) 124/68 mmHg (11/02 0618) SpO2:  [94 %-99 %] 99 % (11/02 0618) Weight:  [103.8 kg (228 lb 13.4 oz)] 103.8 kg (228 lb 13.4 oz) (11/02 0500) Weight change: 1.1 kg (2 lb 6.8 oz)  CBG (last 3)   Basename 05/05/12 0729 05/05/12 0648 05/04/12 2106  GLUCAP 92 58* 145*    Intake/Output from previous day:    Physical Exam: Patient is awake alert no distress bright and conversant. Good facial symmetry. No JVD or bruits. Lungs are clear. Cardiovascular exam sternotomy regular no obvious murmur. Abdomen soft nontender obese. Extremities trace edema bilaterally and intact distal pulses right groin does reveal central necrosis with fluctuance mild diffuse tenderness deep erythema. Neurovascular to the right lower extremities intact.   Lab Results:  [facility] 05/05/12 0556 05/04/12 0650  NA 136 135  K 3.9 3.3*  CL 100 99  CO2 22 23  GLUCOSE 55* 63*  BUN 19 18  CREATININE 1.82* 1.72*  CALCIUM 8.8 8.4  MG -- --  PHOS -- --   No results found for this basename: AST:2,ALT:2,ALKPHOS:2,BILITOT:2,PROT:2,ALBUMIN:2 in the last 72 hours  Basename 05/05/12 0556 05/04/12 0650  WBC 14.4* 13.7*  NEUTROABS -- --  HGB 10.1* 9.8*  HCT 30.9* 29.3*  MCV 91.7 91.0  PLT 211 179   Lab Results  Component Value Date   INR 1.15 05/02/2012   INR 1.79* 10/25/2011   INR 2.24* 10/24/2011   No results found for this basename: CKTOTAL:3,CKMB:3,CKMBINDEX:3,TROPONINI:3 in the last 72 hours No  results found for this basename: TSH,T4TOTAL,FREET3,T3FREE,THYROIDAB in the last 72 hours No results found for this basename: VITAMINB12:2,FOLATE:2,FERRITIN:2,TIBC:2,IRON:2,RETICCTPCT:2 in the last 72 hours  Studies/Results: Ct Pelvis W Contrast  05/03/2012  *RADIOLOGY REPORT*  Clinical Data:  Right groin cellulitis. Tenderness to touch. Warmth.  CT PELVIS WITH CONTRAST  Technique:  Multidetector CT imaging of the pelvis was performed using the standard protocol following the bolus administration of intravenous contrast.  Contrast: 80mL OMNIPAQUE IOHEXOL 300 MG/ML  SOLN  Comparison:   None.  Findings:  There is soft tissue stranding in the subcutaneous fat of the anterior aspect of the proximal right thigh.  There is no abscess.  No significant adenopathy in the right inguinal region. The area of maximum involvement is approximately 6 cm in diameter.  The underlying muscle structures and bones are normal.  Note is made of numerous diverticula in the colon.  Uterus and ovaries been removed.  Incidental note is made of multiple tiny stones in the gallbladder.  Minimal arthritic changes at both hips.  IMPRESSION: Focal slight cellulitis of the subcutaneous fat of the anterior aspect of the proximal right thigh.  No abscess or adenopathy.  Air is seen in the soft tissues of the anterior aspect of both thighs, probably related to subcutaneous injections.   Original Report Authenticated By: Francene Boyers, M.D.    Dg Chest Port 1 View  05/04/2012  *RADIOLOGY REPORT*  Clinical Data: 78 year old female fever and cough.  PORTABLE CHEST - 1 VIEW  Comparison: 04/06/2011 and earlier.  Findings: Portable upright AP view 1529 hours. Stable cardiomegaly and mediastinal contours.  No pulmonary edema.  No pleural effusion.  No consolidation or confluent pulmonary opacity.  No pneumothorax.  IMPRESSION: Stable cardiomegaly.  No acute cardiopulmonary abnormality.   Original Report Authenticated By: Erskine Speed, M.D.       Assessment/Plan: #1 right thigh cellulitis and necrosis currently on antibiotics IV some concern with a rising Seifried count spite being afebrile and clinically feeling better we'll continue to monitor to her and continue IV antibiotics  #2 coronary artery disease with remote coronary artery bypass grafting stable  #3 chronic venous stasis and lower extremity edema on diuretics  #4 essential hypertension  #5 cognitive impairment mild to moderate baseline  #6 diabetes mellitus type 2 with neurologic, renal and ophthalmologic complications  #7 chronic kidney disease presumed stage III close to baseline some decline will follow   LOS: 4 days   Hridaan Bouse A 05/05/2012, 9:27 AM

## 2012-05-05 NOTE — Evaluation (Signed)
Physical Therapy Evaluation Patient Details Name: [name] MRN: [id number] DOB: 09-Apr-1933 Today's Date: 05/05/2012 Time: 7829-5621 PT Time Calculation (min): 34 min  PT Assessment / Plan / Recommendation Clinical Impression  Pt. was admitted with right thigh cellulitis and necrosis and history of diabetic neuropathy, CAD/CABG, chronic venous stasis and LE edema,,mild cognitive impairments, DM, CKD.  Pt. presents with a decline in her usual state of functional independence and will benefit from acute PT to address these/below issues.    PT Assessment  Patient needs continued PT services    Follow Up Recommendations  Home health PT;Supervision/Assistance - 24 hour;Supervision for mobility/OOB    Does the patient have the potential to tolerate intense rehabilitation      Barriers to Discharge None      Equipment Recommendations  None recommended by PT    Recommendations for Other Services     Frequency Min 3X/week    Precautions / Restrictions Precautions Precautions: Fall Restrictions Weight Bearing Restrictions: No   Pertinent Vitals/Pain No c/o pain, slight dyspnea (2/4) with walking in hallway requiring one standing rest break      Mobility  Bed Mobility Bed Mobility: Supine to Sit;Sit to Supine Supine to Sit: HOB flat;6: Modified independent (Device/Increase time) Sit to Supine: Not Tested (comment) Details for Bed Mobility Assistance: pt. able to transtition well with use of rail and HOB flat Transfers Transfers: Sit to Stand;Stand to Sit Sit to Stand: 4: Min guard;With upper extremity assist;From bed Stand to Sit: 4: Min guard;With armrests;To chair/3-in-1 Details for Transfer Assistance: needed cues for hand placement Ambulation/Gait Ambulation/Gait Assistance: 4: Min assist Ambulation Distance (Feet): 140 Feet Assistive device: Rolling walker Ambulation/Gait Assistance Details: Pt. needed one standing rest break during ambulation.  She managed RW  well, needed technique cues. Gait Pattern: Step-through pattern;Wide base of support Gait velocity: slowed Stairs: No    Shoulder Instructions     Exercises     PT Diagnosis: Difficulty walking;Generalized weakness  PT Problem List: Decreased activity tolerance;Decreased mobility;Decreased knowledge of use of DME;Decreased safety awareness;Cardiopulmonary status limiting activity PT Treatment Interventions:     PT Goals Acute Rehab PT Goals PT Goal Formulation: With patient Time For Goal Achievement: 05/12/12 Potential to Achieve Goals: Good Pt will go Supine/Side to Sit: Independently;with HOB 0 degrees PT Goal: Supine/Side to Sit - Progress: Goal set today Pt will go Sit to Supine/Side: Independently;with HOB 0 degrees PT Goal: Sit to Supine/Side - Progress: Goal set today Pt will go Sit to Stand: with modified independence PT Goal: Sit to Stand - Progress: Goal set today Pt will go Stand to Sit: with modified independence PT Goal: Stand to Sit - Progress: Goal set today Pt will Transfer Bed to Chair/Chair to Bed: with modified independence PT Transfer Goal: Bed to Chair/Chair to Bed - Progress: Goal set today Pt will Ambulate: >150 feet;with modified independence;with least restrictive assistive device PT Goal: Ambulate - Progress: Goal set today  Visit Information  Last PT Received On: 05/05/12 Assistance Needed: +1    Subjective Data  Subjective: "It's alot better than it was when I came in" (referring to thigh) Patient Stated Goal: simple tasks like bathing, getting around   Prior Functioning  Home Living Lives With: Spouse Available Help at Discharge: Family;Available 24 hours/day Type of Home: House Home Access: Stairs to enter [employer] of Steps: 1 Entrance Stairs-Rails: Right;Left;Can reach both Home Layout: One level Bathroom Shower/Tub: Walk-in shower;Door Bathroom Toilet: Handicapped height Bathroom Accessibility: Yes How Accessible:  Accessible  via walker Home Adaptive Equipment: Bedside commode/3-in-1;Walker - rolling;Built-in shower seat Prior Function Level of Independence: Independent Able to Take Stairs?: Yes Driving: No Vocation: Retired Musician: No difficulties Dominant Hand: Right    Cognition  Overall Cognitive Status: Appears within functional limits for tasks assessed/performed Arousal/Alertness: Awake/alert Orientation Level: Oriented X4 / Intact Behavior During Session: WFL for tasks performed Cognition - Other Comments: noted some short term memory issues, such as forgetting to turn around at the location therapist had asked her to turn    Extremity/Trunk Assessment Right Upper Extremity Assessment RUE ROM/Strength/Tone: [facility] for tasks assessed Left Upper Extremity Assessment LUE ROM/Strength/Tone: WFL for tasks assessed Right Lower Extremity Assessment RLE ROM/Strength/Tone: WFL for tasks assessed RLE Sensation: History of peripheral neuropathy RLE Coordination: WFL - gross motor Left Lower Extremity Assessment LLE ROM/Strength/Tone: WFL for tasks assessed LLE Sensation: History of peripheral neuropathy LLE Coordination: WFL - gross motor Trunk Assessment Trunk Assessment: Normal   Balance    End of Session PT - End of Session Equipment Utilized During Treatment: Gait belt Activity Tolerance: Patient tolerated treatment well Patient left: in chair;with call bell/phone within reach Nurse Communication: Mobility status;Other (comment) (strong odor form ? vagina with discharge noted on bedpad)  GP     Ferman Hamming 05/05/2012, 11:07 AM Weldon Picking PT Acute Rehab Services [phone] [place] [phone]

## 2012-05-06 LAB — CBC
HCT: 28.6 % — ABNORMAL LOW (ref 36.0–46.0)
MCH: 30.4 pg (ref 26.0–34.0)
MCV: 91.4 fL (ref 78.0–100.0)
RBC: 3.13 MIL/uL — ABNORMAL LOW (ref 3.87–5.11)
RDW: 14.1 % (ref 11.5–15.5)
WBC: 10.5 10*3/uL (ref 4.0–10.5)

## 2012-05-06 LAB — BASIC METABOLIC PANEL
CO2: 24 mEq/L (ref 19–32)
Calcium: 8.5 mg/dL (ref 8.4–10.5)
Chloride: 102 mEq/L (ref 96–112)
Creatinine, Ser: 1.67 mg/dL — ABNORMAL HIGH (ref 0.50–1.10)
Glucose, Bld: 70 mg/dL (ref 70–99)

## 2012-05-06 LAB — GLUCOSE, CAPILLARY
Glucose-Capillary: 85 mg/dL (ref 70–99)
Glucose-Capillary: 91 mg/dL (ref 70–99)

## 2012-05-06 MED ORDER — INSULIN GLARGINE 100 UNIT/ML ~~LOC~~ SOLN
20.0000 [IU] | Freq: Two times a day (BID) | SUBCUTANEOUS | Status: DC
  Administered 2012-05-06: 20 [IU] via SUBCUTANEOUS

## 2012-05-06 MED ORDER — VANCOMYCIN HCL IN DEXTROSE 1-5 GM/200ML-% IV SOLN
1000.0000 mg | INTRAVENOUS | Status: DC
  Administered 2012-05-06: 1000 mg via INTRAVENOUS
  Filled 2012-05-06: qty 200

## 2012-05-06 NOTE — Progress Notes (Signed)
Subjective: Patient is in good spirits. Again by her report the thigh slowly improves. The roof of this area did basically erupt and start draining yesterday despite the scan which demonstrated no abscess. This appeared to be more superficial and involve the area of central necrosis. It continues to drain some serosanguineous material. It has a dressing applied and includes a Vaseline gauze and 4 x 4 awaiting wound care consult she's been ED well and drinking well. She has been ambulate several times daily but does remain a bit weak. She's in good spirits. She's had no fever nausea or vomiting.  Objective: Vital signs in last 24 hours: Temp:  [97.5 F (36.4 C)-98.3 F (36.8 C)] 98.3 F (36.8 C) (11/03 0447) Pulse Rate:  [73-75] 75  (11/03 0447) Resp:  [17-18] 17  (11/03 0447) BP: (120-126)/(68-70) 120/68 mmHg (11/03 0447) SpO2:  [95 %-98 %] 98 % (11/03 0447) Weight:  [105.2 kg (231 lb 14.8 oz)] 105.2 kg (231 lb 14.8 oz) (11/03 0447) Weight change: 1.4 kg (3 lb 1.4 oz)  CBG (last 3)   Basename 05/06/12 0755 05/05/12 2135 05/05/12 1716  GLUCAP 85 247* 175*    Intake/Output from previous day: 11/02 0701 - 11/03 0700 In: 3207 [I.V.:707; IV Piggyback:2500] Out: -   Physical Exam: Patient is awake alert no distress. Good facial symmetry. She is interactive bright and conversant. No JVD or bruits. Lungs are clear to auscultation and percussion. Cardiovascular exam reveals sternotomy incision she is regular no murmur. Abdomen is obese soft nontender bowel sounds. Extremities do reveal some trace edema bilaterally intact distal pulses. Calves soft and nontender no Homans sign. Right groin was undressed does reveal some surrounding erythema and dark in color irregular with a central irregular area of necrosis and draining small overall the wound is cleaning up nicely. Neurologic exam is normal. Neurovascular the right lower extremity is intact.   Lab Results:  Basename 05/06/12 0521 05/05/12  0556  NA 136 136  K 3.9 3.9  CL 102 100  CO2 24 22  GLUCOSE 70 55*  BUN 20 19  CREATININE 1.67* 1.82*  CALCIUM 8.5 8.8  MG -- --  PHOS -- --   No results found for this basename: AST:2,ALT:2,ALKPHOS:2,BILITOT:2,PROT:2,ALBUMIN:2 in the last 72 hours  Basename 05/06/12 0521 05/05/12 0556  WBC 10.5 14.4*  NEUTROABS -- --  HGB 9.5* 10.1*  HCT 28.6* 30.9*  MCV 91.4 91.7  PLT 190 211   Lab Results  Component Value Date   INR 1.15 05/02/2012   INR 1.79* 10/25/2011   INR 2.24* 10/24/2011   No results found for this basename: CKTOTAL:3,CKMB:3,CKMBINDEX:3,TROPONINI:3 in the last 72 hours No results found for this basename: TSH,T4TOTAL,FREET3,T3FREE,THYROIDAB in the last 72 hours No results found for this basename: VITAMINB12:2,FOLATE:2,FERRITIN:2,TIBC:2,IRON:2,RETICCTPCT:2 in the last 72 hours  Studies/Results: Dg Chest Port 1 View  05/04/2012  *RADIOLOGY REPORT*  Clinical Data: 78 year old female fever and cough.  PORTABLE CHEST - 1 VIEW  Comparison: 04/06/2011 and earlier.  Findings: Portable upright AP view 1529 hours. Stable cardiomegaly and mediastinal contours.  No pulmonary edema.  No pleural effusion.  No consolidation or confluent pulmonary opacity.  No pneumothorax.  IMPRESSION: Stable cardiomegaly.  No acute cardiopulmonary abnormality.   Original Report Authenticated By: Erskine Speed, M.D.      Assessment/Plan: #1 right thigh cellulitis with necrotic wound no deep abscess by scan currently on IV antibiotics and now draining. Fortunately Mckiver blood cell count has finally come down following the drainage spontaneous we'll continue the  IV antibiotics. I do have a wound care consult. Question whether this will need some debridement we'll leave surgical consult for reconsult up to primary physician  #2 coronary artery disease with remote coronary artery bypass grafting stable  #3 chronic venous stasis with chronic lower [place] edema diuretics being held because of rising  creatinine improved today  #4 essential hypertension stable  #5 mild cognitive impairment clearly at baseline and looks good  #6 diabetes mellitus type 2 with neurologic, renal and ophthalmologic complications reasonable control  #7 chronic kidney disease stage III now back close to baseline with diuretics being held   LOS: 5 days   Charly Holcomb A 05/06/2012, 8:11 AM

## 2012-05-06 NOTE — Progress Notes (Signed)
ANTIBIOTIC CONSULT NOTE - Follow-up  Pharmacy Consult for vancomycin Indication: groin cellulitis/?abscess  Allergies  Allergen Reactions  . Betadine (Povidone Iodine) Itching  . Capoten (Captopril) Other (See Comments)    unknown  . Darvocet (Propoxyphene-Acetaminophen) Other (See Comments)    unknown  . Penicillins Swelling    Patient Measurements: Height: 5\' 3"  (160 cm) Weight: 231 lb 14.8 oz (105.2 kg) IBW/kg (Calculated) : 52.4   Vital Signs: Temp: 97.7 F (36.5 C) (11/03 1327) Temp src: Oral (11/03 1327) BP: 122/77 mmHg (11/03 1327) Pulse Rate: 65  (11/03 1327) Intake/Output from previous day: 11/02 0701 - 11/03 0700 In: 3207 [I.V.:707; IV Piggyback:2500] Out: -  Intake/Output from this shift:    Labs:  Basename 05/06/12 0521 05/05/12 0556 05/04/12 0650  WBC 10.5 14.4* 13.7*  HGB 9.5* 10.1* 9.8*  PLT 190 211 179  LABCREA -- -- --  CREATININE 1.67* 1.82* 1.72*   Estimated Creatinine Clearance: 32.7 ml/min (by C-G formula based on Cr of 1.67).  Basename 05/06/12 1906  VANCOTROUGH 22.1*  VANCOPEAK --  Drue Dun --  GENTTROUGH --  GENTPEAK --  GENTRANDOM --  TOBRATROUGH --  Nolen Mu --  TOBRARND --  AMIKACINPEAK --  AMIKACINTROU --  AMIKACIN --     Microbiology: No results found for this or any previous visit (from the past 720 hour(s)).   Assessment: 89 yof with groin wound/swelling on vancomycin per pharmacy and clindamycin per md. WBC now wnl and pt is afebrile. Scr improving and estimated creatinine clearance is 32. Vancomycin trough is 22 (goal in this pt 10-15).    Vanc 10/29>> Clinda 10/29>>  Goal of Therapy:  Vancomycin trough level 10-15 mcg/ml  Plan:  Decrease vancomycin to 1000mg  IV q24h F/u renal function, trough, LOT  Thank you,  Brett Fairy, PharmD 05/06/2012 8:42 PM

## 2012-05-06 NOTE — Consult Note (Signed)
WOC consult Note Reason for Consult:right groin abscess Wound type:infectious Pressure Ulcer POA: No Measurement:Open area is now 1.5cm x 4cm with depth obscured by necrotic slough Wound bed:not able to see Drainage (amount, consistency, odor) moderate amount of brown, thin, foul smelling exudate. Periwound:still with erythema and induration, warm to the touch  Dressing procedure/placement/frequency: I see from chart that there is a note to reconsult CCS and patient states that MD is going to "ask a surgeon to drain it". Until they see, I will recommend twice daily cleansing with NS and placement of a non-adherent dressing (adaptic, oil emulsion), topped with gauze and secured with tape. I will not follow.  Please re-consult if needed. Thanks, Ladona Mow, MSN, RN, [facility], CWOCN [phone])

## 2012-05-07 ENCOUNTER — Encounter (HOSPITAL_COMMUNITY): Payer: Self-pay | Admitting: Anesthesiology

## 2012-05-07 ENCOUNTER — Encounter (HOSPITAL_COMMUNITY)
Admission: EM | Disposition: A | Discharge status: Home Health | Payer: Self-pay | Source: Home / Self Care | Attending: Internal Medicine

## 2012-05-07 ENCOUNTER — Inpatient Hospital Stay (HOSPITAL_COMMUNITY): Payer: Medicare Other | Admitting: Anesthesiology

## 2012-05-07 DIAGNOSIS — L0211 Cutaneous abscess of neck: Secondary | ICD-10-CM

## 2012-05-07 DIAGNOSIS — L03221 Cellulitis of neck: Secondary | ICD-10-CM

## 2012-05-07 HISTORY — PX: INCISION AND DRAINAGE ABSCESS: SHX5864

## 2012-05-07 LAB — BASIC METABOLIC PANEL
BUN: 18 mg/dL (ref 6–23)
CO2: 25 mEq/L (ref 19–32)
Calcium: 8.6 mg/dL (ref 8.4–10.5)
Creatinine, Ser: 1.56 mg/dL — ABNORMAL HIGH (ref 0.50–1.10)
GFR calc non Af Amer: 31 mL/min — ABNORMAL LOW (ref >90)
Glucose, Bld: 65 mg/dL — ABNORMAL LOW (ref 70–99)

## 2012-05-07 LAB — GLUCOSE, CAPILLARY
Glucose-Capillary: 68 mg/dL — ABNORMAL LOW (ref 70–99)
Glucose-Capillary: 98 mg/dL (ref 70–99)

## 2012-05-07 LAB — CBC
MCH: 29.7 pg (ref 26.0–34.0)
MCHC: 32.6 g/dL (ref 30.0–36.0)
MCV: 90.9 fL (ref 78.0–100.0)
Platelets: 235 10*3/uL (ref 150–400)
RDW: 14 % (ref 11.5–15.5)

## 2012-05-07 SURGERY — INCISION AND DRAINAGE, ABSCESS
Anesthesia: General | Wound class: Dirty or Infected

## 2012-05-07 MED ORDER — FENTANYL CITRATE 0.05 MG/ML IJ SOLN
INTRAMUSCULAR | Status: DC | PRN
  Administered 2012-05-07: 100 ug via INTRAVENOUS
  Administered 2012-05-07: 25 ug via INTRAVENOUS
  Administered 2012-05-07 (×2): 50 ug via INTRAVENOUS
  Administered 2012-05-07: 25 ug via INTRAVENOUS

## 2012-05-07 MED ORDER — INSULIN GLARGINE 100 UNIT/ML ~~LOC~~ SOLN
10.0000 [IU] | Freq: Two times a day (BID) | SUBCUTANEOUS | Status: DC
  Administered 2012-05-07 – 2012-05-11 (×8): 10 [IU] via SUBCUTANEOUS

## 2012-05-07 MED ORDER — ACETAMINOPHEN 10 MG/ML IV SOLN
INTRAVENOUS | Status: AC
  Filled 2012-05-07: qty 100

## 2012-05-07 MED ORDER — PROMETHAZINE HCL 25 MG/ML IJ SOLN
6.2500 mg | Freq: Once | INTRAMUSCULAR | Status: AC
  Administered 2012-05-07: 6.25 mg via INTRAVENOUS

## 2012-05-07 MED ORDER — ACETAMINOPHEN 500 MG PO TABS
1000.0000 mg | ORAL_TABLET | Freq: Three times a day (TID) | ORAL | Status: DC
  Administered 2012-05-07 – 2012-05-11 (×11): 1000 mg via ORAL
  Filled 2012-05-07 (×14): qty 2

## 2012-05-07 MED ORDER — VANCOMYCIN HCL IN DEXTROSE 1-5 GM/200ML-% IV SOLN
1000.0000 mg | INTRAVENOUS | Status: DC
  Administered 2012-05-07 – 2012-05-09 (×3): 1000 mg via INTRAVENOUS
  Filled 2012-05-07 (×4): qty 200

## 2012-05-07 MED ORDER — BUPIVACAINE-EPINEPHRINE PF 0.25-1:200000 % IJ SOLN
INTRAMUSCULAR | Status: DC | PRN
  Administered 2012-05-07: 60 mL

## 2012-05-07 MED ORDER — PROPOFOL 10 MG/ML IV BOLUS
INTRAVENOUS | Status: DC | PRN
  Administered 2012-05-07: 110 mg via INTRAVENOUS

## 2012-05-07 MED ORDER — SODIUM CHLORIDE 0.9 % IV SOLN
250.0000 mg | Freq: Four times a day (QID) | INTRAVENOUS | Status: DC
  Administered 2012-05-07 – 2012-05-10 (×11): 250 mg via INTRAVENOUS
  Filled 2012-05-07 (×14): qty 250

## 2012-05-07 MED ORDER — ONDANSETRON HCL 4 MG/2ML IJ SOLN
4.0000 mg | Freq: Once | INTRAMUSCULAR | Status: AC | PRN
  Administered 2012-05-07: 4 mg via INTRAVENOUS

## 2012-05-07 MED ORDER — OXYCODONE HCL 5 MG PO TABS: 5.0000 mg | ORAL_TABLET | Freq: Four times a day (QID) | ORAL | Status: DC | PRN

## 2012-05-07 MED ORDER — BUPIVACAINE-EPINEPHRINE PF 0.25-1:200000 % IJ SOLN
INTRAMUSCULAR | Status: AC
  Filled 2012-05-07: qty 90

## 2012-05-07 MED ORDER — PROMETHAZINE HCL 25 MG/ML IJ SOLN
INTRAMUSCULAR | Status: AC
  Administered 2012-05-07: 6.25 mg via INTRAVENOUS
  Filled 2012-05-07: qty 1

## 2012-05-07 MED ORDER — LACTATED RINGERS IV SOLN
INTRAVENOUS | Status: DC | PRN
  Administered 2012-05-07: 17:00:00 via INTRAVENOUS

## 2012-05-07 MED ORDER — BUPIVACAINE-EPINEPHRINE PF 0.25-1:200000 % IJ SOLN
INTRAMUSCULAR | Status: AC
  Filled 2012-05-07: qty 30

## 2012-05-07 MED ORDER — GLIMEPIRIDE 2 MG PO TABS
2.0000 mg | ORAL_TABLET | Freq: Two times a day (BID) | ORAL | Status: DC
  Administered 2012-05-09 – 2012-05-11 (×5): 2 mg via ORAL
  Filled 2012-05-07 (×10): qty 1

## 2012-05-07 MED ORDER — 0.9 % SODIUM CHLORIDE (POUR BTL) OPTIME
TOPICAL | Status: DC | PRN
  Administered 2012-05-07 (×2): 1000 mL

## 2012-05-07 MED ORDER — MUPIROCIN 2 % EX OINT
TOPICAL_OINTMENT | Freq: Two times a day (BID) | CUTANEOUS | Status: DC
  Administered 2012-05-07: 1 via NASAL
  Administered 2012-05-08 – 2012-05-10 (×6): via NASAL
  Filled 2012-05-07: qty 22

## 2012-05-07 MED ORDER — LACTATED RINGERS IV SOLN
INTRAVENOUS | Status: DC
  Administered 2012-05-07: 16:00:00 via INTRAVENOUS
  Administered 2012-05-09: 20 mL/h via INTRAVENOUS

## 2012-05-07 MED ORDER — CLINDAMYCIN HCL 300 MG PO CAPS
300.0000 mg | ORAL_CAPSULE | Freq: Three times a day (TID) | ORAL | Status: DC
  Administered 2012-05-07: 300 mg via ORAL
  Filled 2012-05-07 (×4): qty 1

## 2012-05-07 MED ORDER — ACETAMINOPHEN 10 MG/ML IV SOLN
INTRAVENOUS | Status: DC | PRN
  Administered 2012-05-07: 1000 mg via INTRAVENOUS

## 2012-05-07 MED ORDER — ONDANSETRON HCL 4 MG/2ML IJ SOLN
INTRAMUSCULAR | Status: DC | PRN
  Administered 2012-05-07: 4 mg via INTRAVENOUS

## 2012-05-07 MED ORDER — HYDROMORPHONE HCL PF 1 MG/ML IJ SOLN: 0.2500 mg | INTRAMUSCULAR | Status: DC | PRN

## 2012-05-07 MED ORDER — HYDROMORPHONE HCL PF 1 MG/ML IJ SOLN
0.5000 mg | INTRAMUSCULAR | Status: DC | PRN
  Administered 2012-05-08: 1 mg via INTRAVENOUS
  Filled 2012-05-07: qty 1

## 2012-05-07 MED ORDER — ONDANSETRON HCL 4 MG/2ML IJ SOLN
INTRAMUSCULAR | Status: AC
  Administered 2012-05-07: 4 mg via INTRAVENOUS
  Filled 2012-05-07: qty 2

## 2012-05-07 MED ORDER — LIDOCAINE HCL (CARDIAC) 20 MG/ML IV SOLN
INTRAVENOUS | Status: DC | PRN
  Administered 2012-05-07: 100 mg via INTRAVENOUS

## 2012-05-07 SURGICAL SUPPLY — 35 items
BANDAGE CONFORM 2  STR LF (GAUZE/BANDAGES/DRESSINGS) ×1 IMPLANT
BANDAGE GAUZE ELAST BULKY 4 IN (GAUZE/BANDAGES/DRESSINGS) ×2 IMPLANT
BLADE SURG 10 STRL SS (BLADE) ×2 IMPLANT
BLADE SURG 15 STRL LF DISP TIS (BLADE) IMPLANT
BLADE SURG 15 STRL SS (BLADE) ×2
CANISTER SUCTION 2500CC (MISCELLANEOUS) ×2 IMPLANT
CLOTH BEACON ORANGE TIMEOUT ST (SAFETY) ×2 IMPLANT
COVER SURGICAL LIGHT HANDLE (MISCELLANEOUS) ×2 IMPLANT
DRAPE LAPAROSCOPIC ABDOMINAL (DRAPES) IMPLANT
DRAPE PED LAPAROTOMY (DRAPES) ×1 IMPLANT
DRAPE UTILITY 15X26 W/TAPE STR (DRAPE) ×4 IMPLANT
DRSG PAD ABDOMINAL 8X10 ST (GAUZE/BANDAGES/DRESSINGS) ×2 IMPLANT
ELECT CAUTERY BLADE 6.4 (BLADE) ×2 IMPLANT
ELECT REM PT RETURN 9FT ADLT (ELECTROSURGICAL) ×2
ELECTRODE REM PT RTRN 9FT ADLT (ELECTROSURGICAL) ×1 IMPLANT
GLOVE BIO SURGEON STRL SZ7 (GLOVE) ×1 IMPLANT
GLOVE BIO SURGEON STRL SZ7.5 (GLOVE) ×1 IMPLANT
GLOVE BIOGEL PI IND STRL 8 (GLOVE) ×1 IMPLANT
GLOVE BIOGEL PI INDICATOR 8 (GLOVE) ×2
GLOVE ECLIPSE 7.5 STRL STRAW (GLOVE) ×1 IMPLANT
GLOVE ECLIPSE 8.0 STRL XLNG CF (GLOVE) ×2 IMPLANT
GOWN PREVENTION PLUS XLARGE (GOWN DISPOSABLE) ×5 IMPLANT
GOWN STRL NON-REIN LRG LVL3 (GOWN DISPOSABLE) ×2 IMPLANT
KIT BASIN OR (CUSTOM PROCEDURE TRAY) ×2 IMPLANT
KIT ROOM TURNOVER OR (KITS) ×2 IMPLANT
NS IRRIG 1000ML POUR BTL (IV SOLUTION) ×2 IMPLANT
PACK GENERAL/GYN (CUSTOM PROCEDURE TRAY) ×2 IMPLANT
PACK LITHOTOMY IV (CUSTOM PROCEDURE TRAY) ×1 IMPLANT
PAD ARMBOARD 7.5X6 YLW CONV (MISCELLANEOUS) ×2 IMPLANT
PENCIL BUTTON HOLSTER BLD 10FT (ELECTRODE) ×1 IMPLANT
SPONGE GAUZE 4X4 12PLY (GAUZE/BANDAGES/DRESSINGS) IMPLANT
SPONGE LAP 18X18 X RAY DECT (DISPOSABLE) ×2 IMPLANT
TAPE CLOTH SURG 6X10 WHT LF (GAUZE/BANDAGES/DRESSINGS) ×1 IMPLANT
TOWEL OR 17X24 6PK STRL BLUE (TOWEL DISPOSABLE) ×2 IMPLANT
TOWEL OR 17X26 10 PK STRL BLUE (TOWEL DISPOSABLE) ×2 IMPLANT

## 2012-05-07 NOTE — Progress Notes (Signed)
Subjective: Admitted with R groin Cellulitis - ? Abscess. I just changed IV to PO ABx as no more fevers and the wound ruptured.  There is packing on and it is soaked and foul smelling Sensitive and Sore. O/W she is doing well.  Objective: Vital signs in last 24 hours: Temp:  [97.7 F (36.5 C)-98.7 F (37.1 C)] 98.7 F (37.1 C) (11/04 0545) Pulse Rate:  [65-72] 70  (11/04 0545) Resp:  [18] 18  (11/04 0545) BP: (117-146)/(49-77) 121/72 mmHg (11/04 0636) SpO2:  [94 %-99 %] 94 % (11/04 0545) Weight:  [101.923 kg (224 lb 11.2 oz)] 101.923 kg (224 lb 11.2 oz) (11/04 0640) Weight change: -3.277 kg (-7 lb 3.6 oz) Last BM Date: 05/06/12  CBG (last 3)   Basename 05/06/12 2217 05/06/12 1705 05/06/12 1207  GLUCAP 233* 193* 91    Intake/Output from previous day:  Intake/Output Summary (Last 24 hours) at 05/07/12 0736 Last data filed at 05/07/12 0437  Gross per 24 hour  Intake 783.33 ml  Output      0 ml  Net 783.33 ml   11/03 0701 - 11/04 0700 In: 783.3 [P.O.:240; I.V.:193.3; IV Piggyback:350] Out: -    Physical Exam  General appearance: Alert  Eyes: no scleral icterus Throat: oropharynx moist without erythema Resp: CTA Cardio: Reg Sternotomy GI: soft, non-tender; bowel sounds normal; no masses,  no organomegaly R groin tender and warm..  There is a central area of necrosis with an open area @ 1.5cm x 4cm with depth obscured by necrotic slough.  + Odor. + drainage.   Extremities: no clubbing, cyanosis or edema   Lab Results:  Basename 05/06/12 0521 05/05/12 0556  NA 136 136  K 3.9 3.9  CL 102 100  CO2 24 22  GLUCOSE 70 55*  BUN 20 19  CREATININE 1.67* 1.82*  CALCIUM 8.5 8.8  MG -- --  PHOS -- --    No results found for this basename: AST:2,ALT:2,ALKPHOS:2,BILITOT:2,PROT:2,ALBUMIN:2 in the last 72 hours   Basename 05/06/12 0521 05/05/12 0556  WBC 10.5 14.4*  NEUTROABS -- --  HGB 9.5* 10.1*  HCT 28.6* 30.9*  MCV 91.4 91.7  PLT 190 211    Lab Results    Component Value Date   INR 1.15 05/02/2012   INR 1.79* 10/25/2011   INR 2.24* 10/24/2011    No results found for this basename: CKTOTAL:3,CKMB:3,CKMBINDEX:3,TROPONINI:3 in the last 72 hours  No results found for this basename: TSH,T4TOTAL,FREET3,T3FREE,THYROIDAB in the last 72 hours  No results found for this basename: VITAMINB12:2,FOLATE:2,FERRITIN:2,TIBC:2,IRON:2,RETICCTPCT:2 in the last 72 hours  Micro Results: No results found for this or any previous visit (from the past 240 hour(s)).   Studies/Results: No results found.   Medications: Scheduled:    . acidophilus  1 capsule Oral Daily  . amitriptyline  75 mg Oral QHS  . aspirin  81 mg Oral Daily  . atorvastatin  40 mg Oral QPM  . clindamycin  300 mg Oral Q8H  . donepezil  5 mg Oral QHS  . glimepiride  4 mg Oral BID  . insulin aspart  0-15 Units Subcutaneous TID WC  . insulin aspart  0-5 Units Subcutaneous QHS  . insulin aspart  2 Units Subcutaneous TID WC  . insulin glargine  20 Units Subcutaneous BID  . levothyroxine  100 mcg Oral QAC breakfast  . metoprolol  25 mg Oral BID  . pantoprazole  40 mg Oral Daily  . cyanocobalamin  1,000 mcg Oral Daily  . Vitamin D  2,000 Units Oral Daily  . [DISCONTINUED] clindamycin (CLEOCIN) IV  900 mg Intravenous Q8H  . [DISCONTINUED] insulin glargine  25 Units Subcutaneous BID  . [DISCONTINUED] vancomycin  1,500 mg Intravenous Q24H  . [DISCONTINUED] vancomycin  1,000 mg Intravenous Q24H   Continuous:    . [DISCONTINUED] sodium chloride 10 mL/hr at 05/06/12 1901     Assessment/Plan: Principal Problem:  *Cellulitis and abscess  1. Sig R Thigh CELLULITIS AND ABSCESS OF LEG EXCEPT FOOT (ICD-682.6).  Change Vanco and Clinda to oral Clinda and Hep Lock.  Appreciate Wound Care.  Will ask CCS to look one more time and see if any I and D needed.  I talked with Dr Charlesetta Shanks and he will see her today.  She is close to D/C over the next 24 hrs.  WBC better.  CT was (-) for deep Abscess.   Continue Hot Packs and superficial dressings.   2. CAD/NQWMI/3vCABG 06/2009/ Mild CM c EF 50% Dr [place]. No Angina.  3 H/O AFib/Flutter - No current issue  4. Hypothyroidism on meds  5. Chronic Anemia - Multifactorial. At 11.4 - 10.7 - 10.1 - 9.5.  Will be followed. 6. MGUS - Dr Arline Asp,  7. OA esp L Knee per Dr Lequita Halt,  8. LE Edema/CVI/venous Stasis - on diuretics. Cr went up and now better,  9 Obesity - needs weight loss  10. HTN - Stable. 12. Cognitive decline and developing Dementia on Aricept.   13. DM2 with Neuropathy/Non-Prolif DM Retinopathy/Cr 1.3-1.8 without microalbuminuria - A1C = 10. Lantus and ISS - CBGs better in hospital than at home.   Basename 05/06/12 2217 05/06/12 1705 05/06/12 1207  GLUCAP 233* 193* 91   ID -  Anti-infectives     Start     Dose/Rate Route Frequency Ordered Stop   05/07/12 0800   clindamycin (CLEOCIN) capsule 300 mg        300 mg Oral 3 times per day 05/07/12 0651     05/06/12 2200   vancomycin (VANCOCIN) IVPB 1000 mg/200 mL premix  Status:  Discontinued        1,000 mg 200 mL/hr over 60 Minutes Intravenous Every 24 hours 05/06/12 2047 05/07/12 0651   05/02/12 2000   vancomycin (VANCOCIN) 1,500 mg in sodium chloride 0.9 % 500 mL IVPB  Status:  Discontinued        1,500 mg 250 mL/hr over 120 Minutes Intravenous Every 24 hours 05/01/12 1931 05/06/12 2047   05/01/12 2200   clindamycin (CLEOCIN) IVPB 900 mg  Status:  Discontinued        900 mg 100 mL/hr over 30 Minutes Intravenous 3 times per day 05/01/12 1825 05/07/12 0651   05/01/12 2000   vancomycin (VANCOCIN) 1,500 mg in sodium chloride 0.9 % 500 mL IVPB        1,500 mg 250 mL/hr over 120 Minutes Intravenous  Once 05/01/12 1933 05/01/12 2205   05/01/12 1630   vancomycin (VANCOCIN) 1,500 mg in sodium chloride 0.9 % 500 mL IVPB  Status:  Discontinued        1,500 mg 250 mL/hr over 120 Minutes Intravenous To Emergency Dept 05/01/12 1604 05/01/12 1934   05/01/12 1515   clindamycin  (CLEOCIN) IVPB 900 mg        900 mg 100 mL/hr over 30 Minutes Intravenous  Once 05/01/12 1511 05/01/12 1614         DVT Prophylaxis - SCDs.    LOS: 6 days   Annai Heick M 05/07/2012, 7:36 AM

## 2012-05-07 NOTE — Op Note (Signed)
05/01/2012 - 05/07/2012  5:49 PM  PATIENT:  [name]  78 y.o. female  Patient Care Team: [name], MD as PCP - General (Internal Medicine)  PRE-OPERATIVE DIAGNOSIS:  groin abcess  POST-OPERATIVE DIAGNOSIS:  Proximal thigh abscess  PROCEDURE:  Procedure(s): INCISION AND DRAINAGE ABSCESS  SURGEON:  Surgeon(s): Ardeth Sportsman, MD  ASSISTANT: RN   ANESTHESIA:   local and general  EBL:     Delay start of Pharmacological VTE agent (>24hrs) due to surgical blood loss or risk of bleeding:  no  DRAINS: none   SPECIMEN:  Source of Specimen:  Necrotic skin/SQ fat  DISPOSITION OF SPECIMEN:  PATHOLOGY  COUNTS:  YES  PLAN OF CARE: Admit to inpatient   PATIENT DISPOSITION:  PACU - hemodynamically stable.  INDICATION:  Morbidly obese female with numerous medical problems.  Found to have a subcutaneous abscess in the proximal thigh.  Initially seemed like cellulitis control antibiotics but has progressively worsened.  We were consulted today.  She had frank skin necrosis and a large region of erythema.  We recommended surgical debridement.  Surgery was delayed to two the patient eating just prior to consultation request.  However we were oriented to today.  I discussed the procedure with her:  The anatomy and physiology of skin abscesses was discussed. Pathophysiology of SQ abscess, possible progression to fasciitis & sepsis, etc discussed . I stressed good hygiene & wound care.  Possible redebridement was discussed as well.  Possibility of recurrence was discussed. Risks, benefits, alternatives were discussed. I noted a good likelihood this will help address the problem.   Risks of anesthesia and other risks discussed. Questions answered.  The patient is considering surgery. They wish to proceed.  OR FINDINGS: 15x10 deep pocket w necrotic fat & purulence.  Fascia OK.  6x4 cm gangrene of skin  DESCRIPTION:   Informed consent was confirmed.   He received IV antibiotics. He  underwent general anesthesia without any difficulty. He was positioned in low lithotomy. Foley catheter was sterilely placed. SCDs were active during the entire case.  His abdomen was prepped and draped in a sterile fashion.  A surgical timeout confirmed our plan.  I excised the area of gangrenous skin and SQ fat.  I sent that for culture.  I released some brown gangrenous fluid.  This carried down through the subcutaneous pocket down to the fascia of the medial thigh muscle compartment.  I also carried more proximally up towards the adductor hiatus.  At the level of the fascia, tissue was viable. There was no evidence of necrotizing fasciitis.  This did not carry across to the perineum.   I easily finger fractured over the anterior compartment fascia but not to the lateral compartment.  I debrided out some necrotic fat.  Did copious irrigation of several liters of saline.   I trimmed out some areas of necrotic fat.  Further trimmed out some necrotic skin.  Remaining tissue was viable.  I assured hemostasis.  I packed the wound with the chlorhexidine soaked Kerlix.  I used an entire 6 inch entire roll.  Patient is being extubated go to recovery room.

## 2012-05-07 NOTE — Transfer of Care (Signed)
Immediate Anesthesia Transfer of Care Note  Patient: [name]  Procedure(s) Performed: Procedure(s) (LRB) with comments: INCISION AND DRAINAGE ABSCESS (N/A) - Groin wound  Patient Location: PACU  Anesthesia Type:General  Level of Consciousness: sedated  Airway & Oxygen Therapy: Patient Spontanous Breathing and Patient connected to nasal cannula oxygen  Post-op Assessment: Report given to PACU RN and Post -op Vital signs reviewed and stable  Post vital signs: stable  Complications:

## 2012-05-07 NOTE — Progress Notes (Signed)
ANTIBIOTIC CONSULT NOTE - INITIAL  Pharmacy Consult for Vancomycin and Primaxin Indication: groin cellulitis/abscess  Allergies  Allergen Reactions  . Betadine (Povidone Iodine) Itching  . Capoten (Captopril) Other (See Comments)    unknown  . Darvocet (Propoxyphene-Acetaminophen) Other (See Comments)    unknown  . Penicillins Swelling    Patient Measurements: Height: 5\' 3"  (160 cm) Weight: 224 lb 11.2 oz (101.923 kg) IBW/kg (Calculated) : 52.4  Adjusted Body Weight:   Vital Signs: Temp: 98.7 F (37.1 C) (11/04 0545) Temp src: Oral (11/04 0545) BP: 121/72 mmHg (11/04 0636) Pulse Rate: 70  (11/04 0545) Intake/Output from previous day: 11/03 0701 - 11/04 0700 In: 783.3 [P.O.:240; I.V.:193.3; IV Piggyback:350] Out: -  Intake/Output from this shift:    Labs:  Basename 05/07/12 0700 05/06/12 0521 05/05/12 0556  WBC 9.3 10.5 14.4*  HGB 9.4* 9.5* 10.1*  PLT 235 190 211  LABCREA -- -- --  CREATININE 1.56* 1.67* 1.82*   Estimated Creatinine Clearance: 34.4 ml/min (by C-G formula based on Cr of 1.56).  Basename 05/06/12 1906  VANCOTROUGH 22.1*  VANCOPEAK --  Drue Dun --  GENTTROUGH --  GENTPEAK --  GENTRANDOM --  TOBRATROUGH --  Nolen Mu --  TOBRARND --  AMIKACINPEAK --  AMIKACINTROU --  AMIKACIN --     Microbiology: No results found for this or any previous visit (from the past 720 hour(s)).  Medical History: Past Medical History  Diagnosis Date  . Coronary artery disease     Severe two vessel; s/p CABG  . Hypertension   . Diabetes mellitus     Type 2  . Hyperlipidemia   . Diabetic neuropathy   . PAF (paroxysmal atrial fibrillation)     failed medical therapy with amiodarone; not a candidate for class IC meds due to her CAD. No Multaq due to CHF hx; QT prolonged with amiodarone; Not able to be ablated due to valve disease  . CVA (cerebral vascular accident) 2004  . Hypothyroidism   . Chronic anemia   . Diastolic dysfunction     per echo in May  of 2012  . GERD (gastroesophageal reflux disease)   . Arthritis   . Valvular heart disease     has moderate mitral insufficiency with severe tricuspid insufficiency per TEE in November 2012  . Memory loss, short term    Assessment: 79yof on Vancomycin and Clindamycin for groin cellulitis which has now progress to abscess per MD notes. Surgery has requested to continue Vancomycin and change Clindamycin to Primaxin for broader coverage. Vancomycin dose was changed to 1g q24h last PM due to supratherapeutic level (22.1).  Patient has listed allergy to PCN (swelling at injection site) - discussed with Surgery, received order to continue with Primaxin. SCr is trending down (1.56, CrCl 34), UOP not recorded. - Afebrile - WBC wnl - No cultures  Goal of Therapy:  Vancomycin trough level 10-15 mcg/ml  Plan:  1. Continue Vancomycin 1g IV q24h - next dose tonight 2. Primaxin 250mg  IV q6h 3. Follow-up surgical plans, renal function and order Vancomycin follow-up level  Cleon Dew [phone] 05/07/2012,10:48 AM

## 2012-05-07 NOTE — Progress Notes (Signed)
ATTENDING ADDENDUM:  I personally reviewed patient's record, examined the patient, and agree with the plan.  15x10cm area of erythema with central necrosis 5x4cm & foul drainage Requires emergent OR Pt just fed - will try to wait until later today  The anatomy and physiology of skin abscesses was discussed. Pathophysiology of SQ abscess, possible progression to fasciitis & sepsis, etc discussed . I stressed good hygiene & wound care.  Possible redebridement was discussed as well.  Possibility of recurrence was discussed. Risks, benefits, alternatives were discussed. I noted a good likelihood this will help address the problem.   Risks of anesthesia and other risks discussed. Questions answered.  The patient does wish to proceed.

## 2012-05-07 NOTE — Progress Notes (Signed)
Physical Therapy Treatment Patient Details Name: [name] MRN: [id number] DOB: 20-Feb-1933 Today's Date: 05/07/2012 Time: 0802-0826 PT Time Calculation (min): 24 min  PT Assessment / Plan / Recommendation Comments on Treatment Session  pt presents with R Groin Abscess.  pt moving well and continues to make progress.      Follow Up Recommendations  Home health PT;Supervision/Assistance - 24 hour     Does the patient have the potential to tolerate intense rehabilitation     Barriers to Discharge        Equipment Recommendations  None recommended by PT    Recommendations for Other Services    Frequency Min 3X/week   Plan Discharge plan remains appropriate;Frequency remains appropriate    Precautions / Restrictions Precautions Precautions: Fall Restrictions Weight Bearing Restrictions: No   Pertinent Vitals/Pain Pt indicates not painful, only a little "stingy" after dressing change.      Mobility  Bed Mobility Bed Mobility: Not assessed (pt sitting EOB) Transfers Transfers: Sit to Stand;Stand to Sit Sit to Stand: 5: Supervision;With upper extremity assist;From bed Stand to Sit: 5: Supervision;With upper extremity assist;To chair/3-in-1;With armrests Details for Transfer Assistance: cues for UE use and to get closer to recliner prior to sitting.   Ambulation/Gait Ambulation/Gait Assistance: 4: Min guard Ambulation Distance (Feet): 160 Feet Assistive device: Rolling walker Ambulation/Gait Assistance Details: pt moves slowly and needs cues for upright posture and staying closer to RW.  pt indicating starting to feel a little flushed and needed to return to room.   Gait Pattern: Step-through pattern;Decreased stride length;Trunk flexed;Wide base of support Stairs: No Wheelchair Mobility Wheelchair Mobility: No    Exercises     PT Diagnosis:    PT Problem List:   PT Treatment Interventions:     PT Goals Acute Rehab PT Goals Time For Goal Achievement:  05/12/12 PT Goal: Sit to Stand - Progress: Progressing toward goal PT Goal: Stand to Sit - Progress: Progressing toward goal PT Goal: Ambulate - Progress: Progressing toward goal  Visit Information  Last PT Received On: 05/07/12 Assistance Needed: +1    Subjective Data  Subjective: It's only a little stingy this morning from the dressing change.     Cognition  Overall Cognitive Status: Appears within functional limits for tasks assessed/performed Arousal/Alertness: Awake/alert Orientation Level: Oriented X4 / Intact Behavior During Session: [facility] for tasks performed    Balance  Balance Balance Assessed: No  End of Session PT - End of Session Equipment Utilized During Treatment: Gait belt Activity Tolerance: Patient tolerated treatment well Patient left: in chair;with call bell/phone within reach Nurse Communication: Mobility status   GP     [name], [place] [phone] 05/07/2012, 8:30 AM

## 2012-05-07 NOTE — Anesthesia Preprocedure Evaluation (Signed)
Anesthesia Evaluation  Patient identified by MRN, date of birth, ID band Patient awake    Reviewed: Allergy & Precautions, H&P , NPO status , Patient's Chart, lab work & pertinent test results  Airway Mallampati: I TM Distance: >3 FB Neck ROM: full    Dental   Pulmonary          Cardiovascular hypertension, + CAD + dysrhythmias Rhythm:regular Rate:Normal     Neuro/Psych TIA   GI/Hepatic GERD-  ,  Endo/Other  diabetes, Type 2, Insulin DependentHypothyroidism   Renal/GU Renal disease     Musculoskeletal   Abdominal   Peds  Hematology   Anesthesia Other Findings   Reproductive/Obstetrics                           Anesthesia Physical Anesthesia Plan  ASA: III  Anesthesia Plan: General   Post-op Pain Management:    Induction: Intravenous  Airway Management Planned: LMA and Oral ETT  Additional Equipment:   Intra-op Plan:   Post-operative Plan: Extubation in OR  Informed Consent: I have reviewed the patients History and Physical, chart, labs and discussed the procedure including the risks, benefits and alternatives for the proposed anesthesia with the patient or authorized representative who has indicated his/her understanding and acceptance.     Plan Discussed with: CRNA, Anesthesiologist and Surgeon  Anesthesia Plan Comments:         Anesthesia Quick Evaluation

## 2012-05-07 NOTE — Anesthesia Postprocedure Evaluation (Signed)
  Anesthesia Post-op Note  Patient: [name]  Procedure(s) Performed: Procedure(s) (LRB) with comments: INCISION AND DRAINAGE ABSCESS (N/A) - Groin wound  Patient Location: PACU  Anesthesia Type:General  Level of Consciousness: awake, oriented, sedated and patient cooperative  Airway and Oxygen Therapy: Patient Spontanous Breathing  Post-op Pain: mild  Post-op Assessment: Post-op Vital signs reviewed, Patient's Cardiovascular Status Stable, Respiratory Function Stable, Patent Airway, No signs of Nausea or vomiting and Pain level controlled  Post-op Vital Signs: stable  Complications: No apparent anesthesia complications

## 2012-05-07 NOTE — Progress Notes (Signed)
Patient ID: [name], female   DOB: 07-06-1932, 78 y.o.   MRN: [id number]    Subjective: Pt feels ok today.  Unsure when wound started draining.  Objective: Vital signs in last 24 hours: Temp:  [97.7 F (36.5 C)-98.7 F (37.1 C)] 98.7 F (37.1 C) (11/04 0545) Pulse Rate:  [65-72] 70  (11/04 0545) Resp:  [18] 18  (11/04 0545) BP: (117-146)/(49-77) 121/72 mmHg (11/04 0636) SpO2:  [94 %-99 %] 94 % (11/04 0545) Weight:  [224 lb 11.2 oz (101.923 kg)] 224 lb 11.2 oz (101.923 kg) (11/04 0640) Last BM Date: 05/06/12  Intake/Output from previous day: 11/03 0701 - 11/04 0700 In: 783.3 [P.O.:240; I.V.:193.3; IV Piggyback:350] Out: -  Intake/Output this shift:    PE: Ext: right thigh with large abscess with central necrosis,  Foul smelling purulent drainage along with cellulitis.  Lab Results:   [facility] 05/07/12 0700 05/06/12 0521  WBC 9.3 10.5  HGB 9.4* 9.5*  HCT 28.8* 28.6*  PLT 235 190   BMET  Basename 05/07/12 0700 05/06/12 0521  NA 137 136  K 3.9 3.9  CL 103 102  CO2 25 24  GLUCOSE 65* 70  BUN 18 20  CREATININE 1.56* 1.67*  CALCIUM 8.6 8.5   PT/INR No results found for this basename: LABPROT:2,INR:2 in the last 72 hours CMP     Component Value Date/Time   NA 137 05/07/2012 0700   K 3.9 05/07/2012 0700   CL 103 05/07/2012 0700   CO2 25 05/07/2012 0700   GLUCOSE 65* 05/07/2012 0700   BUN 18 05/07/2012 0700   CREATININE 1.56* 05/07/2012 0700   CREATININE 1.68* 05/16/2011 1352   CALCIUM 8.6 05/07/2012 0700   PROT 7.5 05/02/2012 0630   ALBUMIN 2.9* 05/02/2012 0630   AST 22 05/02/2012 0630   ALT 16 05/02/2012 0630   ALKPHOS 88 05/02/2012 0630   BILITOT 0.8 05/02/2012 0630   GFRNONAA 31* 05/07/2012 0700   GFRAA 36* 05/07/2012 0700   Lipase  No results found for this basename: lipase       Studies/Results: No results found.  Anti-infectives: Anti-infectives     Start     Dose/Rate Route Frequency Ordered Stop   05/07/12 0800   clindamycin (CLEOCIN)  capsule 300 mg        300 mg Oral 3 times per day 05/07/12 0651     05/06/12 2200   vancomycin (VANCOCIN) IVPB 1000 mg/200 mL premix  Status:  Discontinued        1,000 mg 200 mL/hr over 60 Minutes Intravenous Every 24 hours 05/06/12 2047 05/07/12 0651   05/02/12 2000   vancomycin (VANCOCIN) 1,500 mg in sodium chloride 0.9 % 500 mL IVPB  Status:  Discontinued        1,500 mg 250 mL/hr over 120 Minutes Intravenous Every 24 hours 05/01/12 1931 05/06/12 2047   05/01/12 2200   clindamycin (CLEOCIN) IVPB 900 mg  Status:  Discontinued        900 mg 100 mL/hr over 30 Minutes Intravenous 3 times per day 05/01/12 1825 05/07/12 0651   05/01/12 2000   vancomycin (VANCOCIN) 1,500 mg in sodium chloride 0.9 % 500 mL IVPB        1,500 mg 250 mL/hr over 120 Minutes Intravenous  Once 05/01/12 1933 05/01/12 2205   05/01/12 1630   vancomycin (VANCOCIN) 1,500 mg in sodium chloride 0.9 % 500 mL IVPB  Status:  Discontinued        1,500 mg 250 mL/hr over  120 Minutes Intravenous To Emergency Dept 05/01/12 1604 05/01/12 1934   05/01/12 1515   clindamycin (CLEOCIN) IVPB 900 mg        900 mg 100 mL/hr over 30 Minutes Intravenous  Once 05/01/12 1511 05/01/12 1614           Assessment/Plan  1. Right thigh cellulitis, now with abscess 2. DM 3. CAD  Plan: 1. Npo 2. Stop po clinda and start IV vanc and primaxin 3. Will call Dr. Timothy Lasso to inform him of plan.   LOS: 6 days    [name] E 05/07/2012, 10:04 AM Pager: [phone]

## 2012-05-08 ENCOUNTER — Encounter (HOSPITAL_COMMUNITY): Payer: Self-pay | Admitting: Surgery

## 2012-05-08 LAB — GLUCOSE, CAPILLARY
Glucose-Capillary: 66 mg/dL — ABNORMAL LOW (ref 70–99)
Glucose-Capillary: 68 mg/dL — ABNORMAL LOW (ref 70–99)
Glucose-Capillary: 197 mg/dL — ABNORMAL HIGH (ref 70–99)
Glucose-Capillary: 160 mg/dL — ABNORMAL HIGH (ref 70–99)

## 2012-05-08 LAB — BASIC METABOLIC PANEL
BUN: 15 mg/dL (ref 6–23)
CO2: 23 mEq/L (ref 19–32)
Calcium: 8.5 mg/dL (ref 8.4–10.5)
Creatinine, Ser: 1.37 mg/dL — ABNORMAL HIGH (ref 0.50–1.10)
Glucose, Bld: 229 mg/dL — ABNORMAL HIGH (ref 70–99)

## 2012-05-08 LAB — CBC
MCH: 29.2 pg (ref 26.0–34.0)
MCHC: 31.9 g/dL (ref 30.0–36.0)
MCV: 91.4 fL (ref 78.0–100.0)
Platelets: 271 10*3/uL (ref 150–400)
RBC: 3.15 MIL/uL — ABNORMAL LOW (ref 3.87–5.11)
RDW: 14.3 % (ref 11.5–15.5)

## 2012-05-08 MED ORDER — ONDANSETRON HCL 4 MG/2ML IJ SOLN
4.0000 mg | Freq: Once | INTRAMUSCULAR | Status: AC
  Administered 2012-05-08: 4 mg via INTRAVENOUS

## 2012-05-08 MED ORDER — ONDANSETRON HCL 4 MG/2ML IJ SOLN
INTRAMUSCULAR | Status: AC
  Administered 2012-05-08: 4 mg via INTRAVENOUS
  Filled 2012-05-08: qty 2

## 2012-05-08 MED ORDER — OXYCODONE HCL 5 MG PO TABS
5.0000 mg | ORAL_TABLET | Freq: Four times a day (QID) | ORAL | Status: DC | PRN
  Administered 2012-05-08 – 2012-05-11 (×4): 5 mg via ORAL
  Filled 2012-05-08 (×4): qty 1

## 2012-05-08 MED ORDER — DIPHENHYDRAMINE HCL 50 MG/ML IJ SOLN
12.5000 mg | Freq: Four times a day (QID) | INTRAMUSCULAR | Status: DC | PRN
Start: 2012-05-08 — End: 2012-05-11

## 2012-05-08 NOTE — Progress Notes (Signed)
Seen and agree.  Doing well.  Cont wound care

## 2012-05-08 NOTE — Progress Notes (Signed)
Subjective: POD #1 I and D. Wound packed. No new C/O   Objective: Vital signs in last 24 hours: Temp:  [97.5 F (36.4 C)-98.7 F (37.1 C)] 97.6 F (36.4 C) (11/05 0441) Pulse Rate:  [60-77] 60  (11/05 0441) Resp:  [10-24] 16  (11/05 0441) BP: (123-186)/(48-77) 145/61 mmHg (11/05 0441) SpO2:  [96 %-100 %] 100 % (11/05 0441) Weight:  [103 kg (227 lb 1.2 oz)] 103 kg (227 lb 1.2 oz) (11/05 0441) Weight change: 1.077 kg (2 lb 6 oz) Last BM Date: 05/07/12  CBG (last 3)   Basename 05/07/12 2146 05/07/12 1805 05/07/12 1622  GLUCAP 172* 119* 122*    Intake/Output from previous day:  Intake/Output Summary (Last 24 hours) at 05/08/12 0809 Last data filed at 05/08/12 0543  Gross per 24 hour  Intake   1365 ml  Output    250 ml  Net   1115 ml   11/04 0701 - 11/05 0700 In: 1365 [I.V.:1365] Out: 250 [Urine:250]   Physical Exam  General appearance: Alert  Eyes: no scleral icterus Throat: oropharynx moist without erythema Resp: CTA Cardio: Reg Sternotomy GI: soft, non-tender; bowel sounds normal; no masses,  no organomegaly R groin  Packed and no foul odor.  Some drainage noted. Extremities: no clubbing, cyanosis or edema   Lab Results:  Basename 05/07/12 0700 05/06/12 0521  NA 137 136  K 3.9 3.9  CL 103 102  CO2 25 24  GLUCOSE 65* 70  BUN 18 20  CREATININE 1.56* 1.67*  CALCIUM 8.6 8.5  MG -- --  PHOS -- --    No results found for this basename: AST:2,ALT:2,ALKPHOS:2,BILITOT:2,PROT:2,ALBUMIN:2 in the last 72 hours   Basename 05/07/12 0700 05/06/12 0521  WBC 9.3 10.5  NEUTROABS -- --  HGB 9.4* 9.5*  HCT 28.8* 28.6*  MCV 90.9 91.4  PLT 235 190    Lab Results  Component Value Date   INR 1.15 05/02/2012   INR 1.79* 10/25/2011   INR 2.24* 10/24/2011    No results found for this basename: CKTOTAL:3,CKMB:3,CKMBINDEX:3,TROPONINI:3 in the last 72 hours  No results found for this basename: TSH,T4TOTAL,FREET3,T3FREE,THYROIDAB in the last 72 hours  No results  found for this basename: VITAMINB12:2,FOLATE:2,FERRITIN:2,TIBC:2,IRON:2,RETICCTPCT:2 in the last 72 hours  Micro Results: Recent Results (from the past 240 hour(s))  SURGICAL PCR SCREEN     Status: Normal   Collection Time   05/07/12 11:54 AM      Component Value Range Status Comment   MRSA, PCR NEGATIVE  NEGATIVE Final    Staphylococcus aureus NEGATIVE  NEGATIVE Final   TISSUE CULTURE     Status: Normal (Preliminary result)   Collection Time   05/07/12  5:39 PM      Component Value Range Status Comment   Specimen Description TISSUE GROIN RIGHT   Final    Special Requests PATIENT ON FOLLOWING VANCO,CLEOCIN   Final    Gram Stain     Final    Value: ABUNDANT WBC PRESENT, PREDOMINANTLY PMN     ABUNDANT GRAM NEGATIVE RODS     FEW GRAM POSITIVE COCCI IN PAIRS     RARE GRAM POSITIVE RODS   Culture PENDING   Incomplete    Report Status PENDING   Incomplete      Studies/Results: No results found.   Medications: Scheduled:    . acetaminophen  1,000 mg Oral TID  . acidophilus  1 capsule Oral Daily  . amitriptyline  75 mg Oral QHS  . aspirin  81 mg  Oral Daily  . atorvastatin  40 mg Oral QPM  . donepezil  5 mg Oral QHS  . glimepiride  2 mg Oral BID WC  . imipenem-cilastatin  250 mg Intravenous Q6H  . insulin aspart  0-15 Units Subcutaneous TID WC  . insulin aspart  0-5 Units Subcutaneous QHS  . insulin aspart  2 Units Subcutaneous TID WC  . insulin glargine  10 Units Subcutaneous BID  . levothyroxine  100 mcg Oral QAC breakfast  . metoprolol  25 mg Oral BID  . mupirocin ointment   Nasal BID  . pantoprazole  40 mg Oral Daily  . [COMPLETED] promethazine  6.25 mg Intravenous Once  . [COMPLETED] promethazine  6.25 mg Intravenous Once  . vancomycin  1,000 mg Intravenous Q24H  . cyanocobalamin  1,000 mcg Oral Daily  . Vitamin D  2,000 Units Oral Daily  . [DISCONTINUED] clindamycin  300 mg Oral Q8H  . [DISCONTINUED] glimepiride  4 mg Oral BID  . [DISCONTINUED] insulin glargine  20  Units Subcutaneous BID   Continuous:    . lactated ringers 50 mL/hr at 05/08/12 0543     Assessment/Plan: Principal Problem:  *Cellulitis and abscess Active Problems:  Chronic anticoagulation  1. Sig R Thigh CELLULITIS AND ABSCESS OF GROIN .  Back on IV Abx.  POD #1.  Continue wound care.  2. CAD/NQWMI/3vCABG 06/2009/ Mild CM c EF 50% Dr [place]. No Angina.  3 H/O AFib/Flutter - No current issue  4. Hypothyroidism on meds  5. Chronic Anemia - Multifactorial. At 11.4 - 10.7 - 10.1 - 9.5 -9.4.  Will be followed. 6. MGUS - Dr Arline Asp,  7. OA esp L Knee per Dr Lequita Halt,  8. LE Edema/CVI/venous Stasis - on diuretics. Cr went up and now better,  9 Obesity - needs weight loss  10. HTN - Stable. 12. Cognitive decline and developing Dementia on Aricept.   13. DM2 with Neuropathy/Non-Prolif DM Retinopathy/Cr 1.3-1.8 without microalbuminuria - A1C = 10. Lantus and ISS - CBGs better in hospital than at home.  Numerous med adjustments due to lows.    Basename 05/07/12 2146 05/07/12 1805 05/07/12 1622  GLUCAP 172* 119* 122*   ID -  Anti-infectives     Start     Dose/Rate Route Frequency Ordered Stop   05/07/12 2100   vancomycin (VANCOCIN) IVPB 1000 mg/200 mL premix        1,000 mg 200 mL/hr over 60 Minutes Intravenous Every 24 hours 05/07/12 1055     05/07/12 1200   imipenem-cilastatin (PRIMAXIN) 250 mg in sodium chloride 0.9 % 100 mL IVPB        250 mg 200 mL/hr over 30 Minutes Intravenous 4 times per day 05/07/12 1055     05/07/12 0800   clindamycin (CLEOCIN) capsule 300 mg  Status:  Discontinued        300 mg Oral 3 times per day 05/07/12 0651 05/07/12 1007   05/06/12 2200   vancomycin (VANCOCIN) IVPB 1000 mg/200 mL premix  Status:  Discontinued        1,000 mg 200 mL/hr over 60 Minutes Intravenous Every 24 hours 05/06/12 2047 05/07/12 0651   05/02/12 2000   vancomycin (VANCOCIN) 1,500 mg in sodium chloride 0.9 % 500 mL IVPB  Status:  Discontinued        1,500 mg 250 mL/hr  over 120 Minutes Intravenous Every 24 hours 05/01/12 1931 05/06/12 2047   05/01/12 2200   clindamycin (CLEOCIN) IVPB 900 mg  Status:  Discontinued  900 mg 100 mL/hr over 30 Minutes Intravenous 3 times per day 05/01/12 1825 05/07/12 0651   05/01/12 2000   vancomycin (VANCOCIN) 1,500 mg in sodium chloride 0.9 % 500 mL IVPB        1,500 mg 250 mL/hr over 120 Minutes Intravenous  Once 05/01/12 1933 05/01/12 2205   05/01/12 1630   vancomycin (VANCOCIN) 1,500 mg in sodium chloride 0.9 % 500 mL IVPB  Status:  Discontinued        1,500 mg 250 mL/hr over 120 Minutes Intravenous To Emergency Dept 05/01/12 1604 05/01/12 1934   05/01/12 1515   clindamycin (CLEOCIN) IVPB 900 mg        900 mg 100 mL/hr over 30 Minutes Intravenous  Once 05/01/12 1511 05/01/12 1614         DVT Prophylaxis - SCDs.    LOS: 7 days   Jakson Delpilar M 05/08/2012, 8:09 AM

## 2012-05-08 NOTE — Progress Notes (Signed)
Hypoglycemic Event  CBG: 66   Treatment: 15 GM carbohydrate snack  Symptoms: None  Follow-up CBG: Time:8:56 CBG Result:98  Possible Reasons for Event: Medication regimen:   Comments/MD notified:Russo, Simonne Come, Jill Alexanders R  Remember to initiate Hypoglycemia Order Set & complete

## 2012-05-08 NOTE — Progress Notes (Signed)
1 Day Post-Op  Subjective: Patient doing well today, still in moderate pain in right groin.  Pt ambulating to BR, passing gas.  Pt had N/V last night after surgery, but has resolved today and patient is hungry.    Objective: Vital signs in last 24 hours: Temp:  [97.5 F (36.4 C)-98.7 F (37.1 C)] 97.6 F (36.4 C) (11/05 0441) Pulse Rate:  [60-77] 60  (11/05 0441) Resp:  [10-24] 16  (11/05 0441) BP: (123-186)/(48-77) 145/61 mmHg (11/05 0441) SpO2:  [96 %-100 %] 100 % (11/05 0441) Weight:  [227 lb 1.2 oz (103 kg)] 227 lb 1.2 oz (103 kg) (11/05 0441) Last BM Date: 05/07/12  Intake/Output from previous day: 11/04 0701 - 11/05 0700 In: 1365 [I.V.:1365] Out: 250 [Urine:250] Intake/Output this shift:    PE: Gen:  Alert, NAD, pleasant Right Groin:  Wound dressing C/D/I  Lab Results:   Basename 05/07/12 0700 05/06/12 0521  WBC 9.3 10.5  HGB 9.4* 9.5*  HCT 28.8* 28.6*  PLT 235 190   BMET  Basename 05/07/12 0700 05/06/12 0521  NA 137 136  K 3.9 3.9  CL 103 102  CO2 25 24  GLUCOSE 65* 70  BUN 18 20  CREATININE 1.56* 1.67*  CALCIUM 8.6 8.5   PT/INR No results found for this basename: LABPROT:2,INR:2 in the last 72 hours CMP     Component Value Date/Time   NA 137 05/07/2012 0700   K 3.9 05/07/2012 0700   CL 103 05/07/2012 0700   CO2 25 05/07/2012 0700   GLUCOSE 65* 05/07/2012 0700   BUN 18 05/07/2012 0700   CREATININE 1.56* 05/07/2012 0700   CREATININE 1.68* 05/16/2011 1352   CALCIUM 8.6 05/07/2012 0700   PROT 7.5 05/02/2012 0630   ALBUMIN 2.9* 05/02/2012 0630   AST 22 05/02/2012 0630   ALT 16 05/02/2012 0630   ALKPHOS 88 05/02/2012 0630   BILITOT 0.8 05/02/2012 0630   GFRNONAA 31* 05/07/2012 0700   GFRAA 36* 05/07/2012 0700   Lipase  No results found for this basename: lipase       Studies/Results: No results found.  Anti-infectives: Anti-infectives     Start     Dose/Rate Route Frequency Ordered Stop   05/07/12 2100   vancomycin (VANCOCIN) IVPB 1000  mg/200 mL premix        1,000 mg 200 mL/hr over 60 Minutes Intravenous Every 24 hours 05/07/12 1055     05/07/12 1200   imipenem-cilastatin (PRIMAXIN) 250 mg in sodium chloride 0.9 % 100 mL IVPB        250 mg 200 mL/hr over 30 Minutes Intravenous 4 times per day 05/07/12 1055     05/07/12 0800   clindamycin (CLEOCIN) capsule 300 mg  Status:  Discontinued        300 mg Oral 3 times per day 05/07/12 0651 05/07/12 1007   05/06/12 2200   vancomycin (VANCOCIN) IVPB 1000 mg/200 mL premix  Status:  Discontinued        1,000 mg 200 mL/hr over 60 Minutes Intravenous Every 24 hours 05/06/12 2047 05/07/12 0651   05/02/12 2000   vancomycin (VANCOCIN) 1,500 mg in sodium chloride 0.9 % 500 mL IVPB  Status:  Discontinued        1,500 mg 250 mL/hr over 120 Minutes Intravenous Every 24 hours 05/01/12 1931 05/06/12 2047   05/01/12 2200   clindamycin (CLEOCIN) IVPB 900 mg  Status:  Discontinued        900 mg 100 mL/hr over 30 Minutes  Intravenous 3 times per day 05/01/12 1825 05/07/12 0651   05/01/12 2000   vancomycin (VANCOCIN) 1,500 mg in sodium chloride 0.9 % 500 mL IVPB        1,500 mg 250 mL/hr over 120 Minutes Intravenous  Once 05/01/12 1933 05/01/12 2205   05/01/12 1630   vancomycin (VANCOCIN) 1,500 mg in sodium chloride 0.9 % 500 mL IVPB  Status:  Discontinued        1,500 mg 250 mL/hr over 120 Minutes Intravenous To Emergency Dept 05/01/12 1604 05/01/12 1934   05/01/12 1515   clindamycin (CLEOCIN) IVPB 900 mg        900 mg 100 mL/hr over 30 Minutes Intravenous  Once 05/01/12 1511 05/01/12 1614           Assessment/Plan 78 y/o female s/p I&D of right groin abscess 1.  Cont antibiotics, pain control 2.  RN to call when dressing change, WD dressing, pre-medicate 3.  Ambulate OOB-deconditioned 4.  Low carb diet     LOS: 7 days    DORT, Daimen Shovlin 05/08/2012, 8:09 AM Pager: [phone]

## 2012-05-08 NOTE — Progress Notes (Signed)
Inpatient Diabetes Program Recommendations  AACE/ADA: New Consensus Statement on Inpatient Glycemic Control (2013)  Target Ranges:  Prepandial:   less than 140 mg/dL      Peak postprandial:   less than 180 mg/dL (1-2 hours)      Critically ill patients:  140 - 180 mg/dL   Reason for Visit: Hypoglycemia Hypoglycemia at 68 this am.  Then corrected to 98 mg/dL.  Meal coverage for breakfast held per MD order, then ac lunch cbg at 197 mg/dL.  Once corrected, the meal coverage should be given in order to cover the meal Noted Lantus am dose held this am also due to fasting hypoglycemia. Recommend decrease lantus to 10-15 units once a day. Inpatient Diabetes Program Recommendations Insulin - Basal: Please decrease lantus to 10 -15 units once a day. Insulin - Meal Coverage: xxx Oral Agents: Please do not use Amaryl in the hospital.  Beta cells already stressed with infection, and it does not seem to be effective anyway.  PO intake may be variable as well. HgbA1C: Please check present value (last one in April was 10.6% Diet: Added carbohydrate modified to diet orders (heart healthy diet alone has more carbohydrate content than regular diet).  Note: Thank you, Lenor Coffin, RN, CNS, Diabetes Coordinator [phone])

## 2012-05-08 NOTE — Progress Notes (Signed)
Clinical Social Work Department BRIEF PSYCHOSOCIAL ASSESSMENT 05/08/2012  Patient:  [name], [name]     Account Number:  [id number]     Admit date:  05/01/2012  Clinical Social Worker:  Dennison Bulla  Date/Time:  05/08/2012 04:30 PM  Referred by:  Physician  Date Referred:  05/08/2012 Referred for  Psychosocial assessment   Other Referral:   Interview type:  Patient Other interview type:   Husband present    PSYCHOSOCIAL DATA Living Status:  FAMILY Admitted from facility:   Level of care:   Primary support name:  John Primary support relationship to patient:  SPOUSE Degree of support available:   Adequate    CURRENT CONCERNS Current Concerns  Post-Acute Placement  Other - See comment   Other Concerns:   HCPOA    SOCIAL WORK ASSESSMENT / PLAN CSW received referral from RN to assist with questions that patient and spouse had regarding dc plans and HCPOA. CSW reviewed chart and met with patient and spouse at bedside. Patient agreeable to husband involvement.    CSW introduced myself and explained role. Patient reports that PTA she was living with husband. Husband initially reported that he wants patient to return home with [facility]. Patient reports that after a previous surgery she had [place] assist. Patient is worried about her ability to ambulate at home. CSW and patient briefly discussed HH vs SNF. Patient desires to wait until after PT works with her tomorrow to make a decision on [facility] vs SNF.    Husband asked questions regarding HCPOA. CSW provided husband with advanced directives packet. Husband reports he will review information and desires to complete paperwork tomorrow with patient. CSW provided husband and patient with CSW contact information and will follow up with patient tomorrow.   Assessment/plan status:  Psychosocial Support/Ongoing Assessment of Needs Other assessment/ plan:   Information/referral to community resources:   Advanced directives packet  SNF vs  [facility] information    PATIENT'S/FAMILY'S RESPONSE TO PLAN OF CARE: Patient alert and oriented. Patient and husband involved in assessment and agreeable for CSW to follow up tomorrow.

## 2012-05-09 LAB — GLUCOSE, CAPILLARY
Glucose-Capillary: 153 mg/dL — ABNORMAL HIGH (ref 70–99)
Glucose-Capillary: 185 mg/dL — ABNORMAL HIGH (ref 70–99)

## 2012-05-09 LAB — BASIC METABOLIC PANEL
Calcium: 8.4 mg/dL (ref 8.4–10.5)
GFR calc non Af Amer: 31 mL/min — ABNORMAL LOW (ref >90)
Glucose, Bld: 190 mg/dL — ABNORMAL HIGH (ref 70–99)
Sodium: 133 mEq/L — ABNORMAL LOW (ref 135–145)

## 2012-05-09 LAB — CBC
MCH: 30.7 pg (ref 26.0–34.0)
Platelets: 243 10*3/uL (ref 150–400)
RBC: 2.8 MIL/uL — ABNORMAL LOW (ref 3.87–5.11)
RDW: 14.7 % (ref 11.5–15.5)

## 2012-05-09 NOTE — Progress Notes (Signed)
Clinical Social Work  CSW never received a call from husband. CSW spoke with PT who recommends HH. CSW met with patient at bedside. CSW and patient discussed HH vs SNF. Patient reports since she is ambulating well that she would prefer to return home. Patient reports that husband will assist with dressing changes and other needs. CSW informed CM of HH needs. CSW encouraged patient to call CSW if she has questions regarding advanced directives packet. CSW is signing off but available if needed.  [place], [place] [phone]

## 2012-05-09 NOTE — Progress Notes (Signed)
Physical Therapy Treatment Patient Details Name: [name] MRN: [id number] DOB: [birth year] Today's Date: 05/09/2012 Time: 1478-2956 PT Time Calculation (min): 13 min  PT Assessment / Plan / Recommendation Comments on Treatment Session  Pt with rt groin abscess that was I&D'd on 05/07/12.  Pt moving well with walker but is concerned about dressing changes at home.  Passed on pt's concerns to social work and Sports coach.    Follow Up Recommendations  Home health PT;Supervision - Intermittent     Does the patient have the potential to tolerate intense rehabilitation     Barriers to Discharge        Equipment Recommendations  None recommended by PT    Recommendations for Other Services    Frequency Min 3X/week   Plan Discharge plan remains appropriate;Frequency remains appropriate    Precautions / Restrictions Precautions Precautions: Fall Restrictions Weight Bearing Restrictions: No   Pertinent Vitals/Pain No c/o's    Mobility  Transfers Sit to Stand: 5: Supervision;With upper extremity assist;With armrests;From chair/3-in-1 Stand to Sit: 5: Supervision;With upper extremity assist;With armrests;To chair/3-in-1 Details for Transfer Assistance: Uses rocking to generate momentum to get up Ambulation/Gait Ambulation/Gait Assistance: 5: Supervision;4: Min guard (supervision with walker and min guard without walker) Ambulation Distance (Feet): 225 Feet Assistive device: Rolling walker;None (amb 10' in room without assistive device) Ambulation/Gait Assistance Details: verbal cues to watch rt foot to make sure it doesn't hit back foot of walker due to pt's wide base. Gait Pattern: Step-through pattern;Decreased stride length;Decreased step length - right;Decreased step length - left;Wide base of support;Trunk flexed Gait velocity: decr    Exercises     PT Diagnosis:    PT Problem List:   PT Treatment Interventions:     PT Goals Acute Rehab PT Goals PT Goal: Sit to  Stand - Progress: Progressing toward goal PT Goal: Stand to Sit - Progress: Progressing toward goal PT Goal: Ambulate - Progress: Progressing toward goal  Visit Information  Last PT Received On: 05/09/12 Assistance Needed: +1    Subjective Data  Subjective: Pt states she feels pretty good about her mobility but is worried about the dressing change at home.   Cognition  Overall Cognitive Status: Appears within functional limits for tasks assessed/performed Arousal/Alertness: Awake/alert Orientation Level: Appears intact for tasks assessed Behavior During Session: [facility] for tasks performed    Balance  Static Standing Balance Static Standing - Balance Support: No upper extremity supported Static Standing - Level of Assistance: 5: Stand by assistance  End of Session PT - End of Session Activity Tolerance: Patient tolerated treatment well Patient left: with call bell/phone within reach;in chair Nurse Communication: Other (comment) (IV leaking)   GP     Selden Noteboom 05/09/2012, 10:54 AM  Skip Mayer PT [phone]

## 2012-05-09 NOTE — Progress Notes (Signed)
[facility]  Patient Status: New  [facility] is providing the following services: RN and PT  If patient discharges after hours, please call [phone].   [name] 05/09/2012, 3:41 PM

## 2012-05-09 NOTE — Progress Notes (Signed)
Subjective: POD #2 I and D. Wound packed. No new C/O No Nausea.  Poor Appetite. No CP or SOB. Low CBGs every am.   Objective: Vital signs in last 24 hours: Temp:  [98.5 F (36.9 C)-98.8 F (37.1 C)] 98.8 F (37.1 C) (11/06 0500) Pulse Rate:  [61-65] 64  (11/06 0500) Resp:  [18-20] 20  (11/06 0500) BP: (116-146)/(40-76) 116/50 mmHg (11/06 0500) SpO2:  [93 %-100 %] 93 % (11/06 0500) Weight:  [109.1 kg (240 lb 8.4 oz)] 109.1 kg (240 lb 8.4 oz) (11/06 0500) Weight change: 6.1 kg (13 lb 7.2 oz) Last BM Date: 05/07/12  CBG (last 3)   Basename 05/08/12 2203 05/08/12 1712 05/08/12 1347  GLUCAP 224* 160* 197*    Intake/Output from previous day:  Intake/Output Summary (Last 24 hours) at 05/09/12 0706 Last data filed at 05/09/12 0506  Gross per 24 hour  Intake 1529.17 ml  Output      0 ml  Net 1529.17 ml   11/05 0701 - 11/06 0700 In: 1529.2 [P.O.:360; I.V.:1169.2] Out: -    Physical Exam  General appearance: Alert  Resp: CTA Cardio: Reg Sternotomy GI: soft, non-tender; bowel sounds normal; no masses,  no organomegaly R groin  Packed and no foul odor.  Some drainage noted. Extremities: no clubbing, cyanosis or edema   Lab Results:  Basename 05/08/12 1232 05/07/12 0700  NA 131* 137  K 4.3 3.9  CL 99 103  CO2 23 25  GLUCOSE 229* 65*  BUN 15 18  CREATININE 1.37* 1.56*  CALCIUM 8.5 8.6  MG -- --  PHOS -- --    No results found for this basename: AST:2,ALT:2,ALKPHOS:2,BILITOT:2,PROT:2,ALBUMIN:2 in the last 72 hours   Basename 05/08/12 1232 05/07/12 0700  WBC 10.7* 9.3  NEUTROABS -- --  HGB 9.2* 9.4*  HCT 28.8* 28.8*  MCV 91.4 90.9  PLT 271 235    Lab Results  Component Value Date   INR 1.15 05/02/2012   INR 1.79* 10/25/2011   INR 2.24* 10/24/2011    No results found for this basename: CKTOTAL:3,CKMB:3,CKMBINDEX:3,TROPONINI:3 in the last 72 hours  No results found for this basename: TSH,T4TOTAL,FREET3,T3FREE,THYROIDAB in the last 72 hours  No  results found for this basename: VITAMINB12:2,FOLATE:2,FERRITIN:2,TIBC:2,IRON:2,RETICCTPCT:2 in the last 72 hours  Micro Results: Recent Results (from the past 240 hour(s))  SURGICAL PCR SCREEN     Status: Normal   Collection Time   05/07/12 11:54 AM      Component Value Range Status Comment   MRSA, PCR NEGATIVE  NEGATIVE Final    Staphylococcus aureus NEGATIVE  NEGATIVE Final   TISSUE CULTURE     Status: Normal (Preliminary result)   Collection Time   05/07/12  5:39 PM      Component Value Range Status Comment   Specimen Description TISSUE GROIN RIGHT   Final    Special Requests PATIENT ON FOLLOWING VANCO,CLEOCIN   Final    Gram Stain     Final    Value: ABUNDANT WBC PRESENT, PREDOMINANTLY PMN     ABUNDANT GRAM NEGATIVE RODS     FEW GRAM POSITIVE COCCI IN PAIRS     RARE GRAM POSITIVE RODS   Culture NO GROWTH   Final    Report Status PENDING   Incomplete   ANAEROBIC CULTURE     Status: Normal (Preliminary result)   Collection Time   05/07/12  5:39 PM      Component Value Range Status Comment   Specimen Description TISSUE GROIN RIGHT  Final    Special Requests PATIENT ON FOLLOWING VANCO,CLEOCIN   Final    Gram Stain PENDING   Incomplete    Culture     Final    Value: NO ANAEROBES ISOLATED; CULTURE IN PROGRESS FOR 5 DAYS   Report Status PENDING   Incomplete      Studies/Results: No results found.   Medications: Scheduled:    . acetaminophen  1,000 mg Oral TID  . acidophilus  1 capsule Oral Daily  . amitriptyline  75 mg Oral QHS  . aspirin  81 mg Oral Daily  . atorvastatin  40 mg Oral QPM  . donepezil  5 mg Oral QHS  . glimepiride  2 mg Oral BID WC  . imipenem-cilastatin  250 mg Intravenous Q6H  . insulin aspart  0-15 Units Subcutaneous TID WC  . insulin aspart  0-5 Units Subcutaneous QHS  . insulin aspart  2 Units Subcutaneous TID WC  . insulin glargine  10 Units Subcutaneous BID  . levothyroxine  100 mcg Oral QAC breakfast  . metoprolol  25 mg Oral BID  .  mupirocin ointment   Nasal BID  . [COMPLETED] ondansetron  4 mg Intravenous Once  . pantoprazole  40 mg Oral Daily  . vancomycin  1,000 mg Intravenous Q24H  . cyanocobalamin  1,000 mcg Oral Daily  . Vitamin D  2,000 Units Oral Daily   Continuous:    . lactated ringers 50 mL/hr at 05/09/12 0981     Assessment/Plan: Principal Problem:  *Cellulitis and abscess Active Problems:  Chronic anticoagulation  1. Sig R Thigh CELLULITIS AND ABSCESS OF GROIN .  Back on IV Abx.  POD #2.  Continue wound care.  Will ask CCS to advise me when we can change out to PO Abx and when to anticipate D/c. 2. Chronic Anemia - Multifactorial. At 11.4 - 10.7 - 10.1 - 9.5 -9.4 - 9.2.  Will be followed. 3. LE Edema/CVI/venous Stasis - on diuretics. Stable.  Min Edema noted. Cr went up and now better and stable,  4 Obesity - needs weight loss  5. HTN - Stable. 6. Cognitive decline and developing Dementia on Aricept. 7. DM2 with Neuropathy/Non-Prolif DM Retinopathy/Cr 1.3-1.8 without microalbuminuria - A1C = 10. Lantus and ISS - CBGs -Hypo in am.   Basename 05/08/12 2203 05/08/12 1712 05/08/12 1347  GLUCAP 224* 160* 197*   DVT Prophylaxis - SCDs.    LOS: 8 days   [name] M 05/09/2012, 7:06 AM

## 2012-05-09 NOTE — Progress Notes (Signed)
2 Days Post-Op  Subjective: NO c/o  Objective: Vital signs in last 24 hours: Temp:  [98.5 F (36.9 C)-98.8 F (37.1 C)] 98.8 F (37.1 C) (11/06 0500) Pulse Rate:  [61-65] 64  (11/06 0500) Resp:  [18-20] 20  (11/06 0500) BP: (116-146)/(40-76) 116/50 mmHg (11/06 0500) SpO2:  [93 %-100 %] 93 % (11/06 0500) Weight:  [240 lb 8.4 oz (109.1 kg)] 240 lb 8.4 oz (109.1 kg) (11/06 0500) Last BM Date: 05/07/12  Intake/Output from previous day: 11/05 0701 - 11/06 0700 In: 1529.2 [P.O.:360; I.V.:1169.2] Out: -  Intake/Output this shift:    Incision/Wound:open clean with healthy tissue right groin  Lab Results:   [facility] 05/09/12 0639 05/08/12 1232  WBC 11.5* 10.7*  HGB 8.6* 9.2*  HCT 26.1* 28.8*  PLT 243 271   BMET  Basename 05/09/12 0639 05/08/12 1232  NA 133* 131*  K 4.4 4.3  CL 101 99  CO2 24 23  GLUCOSE 190* 229*  BUN 18 15  CREATININE 1.57* 1.37*  CALCIUM 8.4 8.5   PT/INR No results found for this basename: LABPROT:2,INR:2 in the last 72 hours ABG No results found for this basename: PHART:2,PCO2:2,PO2:2,HCO3:2 in the last 72 hours  Studies/Results: No results found.  Anti-infectives: Anti-infectives     Start     Dose/Rate Route Frequency Ordered Stop   05/07/12 2100   vancomycin (VANCOCIN) IVPB 1000 mg/200 mL premix        1,000 mg 200 mL/hr over 60 Minutes Intravenous Every 24 hours 05/07/12 1055     05/07/12 1200   imipenem-cilastatin (PRIMAXIN) 250 mg in sodium chloride 0.9 % 100 mL IVPB        250 mg 200 mL/hr over 30 Minutes Intravenous 4 times per day 05/07/12 1055     05/07/12 0800   clindamycin (CLEOCIN) capsule 300 mg  Status:  Discontinued        300 mg Oral 3 times per day 05/07/12 0651 05/07/12 1007   05/06/12 2200   vancomycin (VANCOCIN) IVPB 1000 mg/200 mL premix  Status:  Discontinued        1,000 mg 200 mL/hr over 60 Minutes Intravenous Every 24 hours 05/06/12 2047 05/07/12 0651   05/02/12 2000   vancomycin (VANCOCIN) 1,500 mg in sodium  chloride 0.9 % 500 mL IVPB  Status:  Discontinued        1,500 mg 250 mL/hr over 120 Minutes Intravenous Every 24 hours 05/01/12 1931 05/06/12 2047   05/01/12 2200   clindamycin (CLEOCIN) IVPB 900 mg  Status:  Discontinued        900 mg 100 mL/hr over 30 Minutes Intravenous 3 times per day 05/01/12 1825 05/07/12 0651   05/01/12 2000   vancomycin (VANCOCIN) 1,500 mg in sodium chloride 0.9 % 500 mL IVPB        1,500 mg 250 mL/hr over 120 Minutes Intravenous  Once 05/01/12 1933 05/01/12 2205   05/01/12 1630   vancomycin (VANCOCIN) 1,500 mg in sodium chloride 0.9 % 500 mL IVPB  Status:  Discontinued        1,500 mg 250 mL/hr over 120 Minutes Intravenous To Emergency Dept 05/01/12 1604 05/01/12 1934   05/01/12 1515   clindamycin (CLEOCIN) IVPB 900 mg        900 mg 100 mL/hr over 30 Minutes Intravenous  Once 05/01/12 1511 05/01/12 1614          Assessment/Plan: s/p Procedure(s) (LRB) with comments: INCISION AND DRAINAGE ABSCESS (N/A) - Groin wound Cont wound care Will  need HHN  LOS: 8 days    [name] A. 05/09/2012

## 2012-05-10 LAB — BASIC METABOLIC PANEL
BUN: 17 mg/dL (ref 6–23)
CO2: 24 mEq/L (ref 19–32)
Chloride: 104 mEq/L (ref 96–112)
GFR calc non Af Amer: 31 mL/min — ABNORMAL LOW (ref >90)
Glucose, Bld: 76 mg/dL (ref 70–99)
Potassium: 4.4 mEq/L (ref 3.5–5.1)

## 2012-05-10 LAB — CBC
HCT: 27.2 % — ABNORMAL LOW (ref 36.0–46.0)
Hemoglobin: 9.1 g/dL — ABNORMAL LOW (ref 12.0–15.0)
MCH: 30.7 pg (ref 26.0–34.0)
MCHC: 33.5 g/dL (ref 30.0–36.0)
MCV: 91.9 fL (ref 78.0–100.0)
Platelets: 318 10*3/uL (ref 150–400)

## 2012-05-10 LAB — GLUCOSE, CAPILLARY: Glucose-Capillary: 147 mg/dL — ABNORMAL HIGH (ref 70–99)

## 2012-05-10 MED ORDER — CLINDAMYCIN HCL 300 MG PO CAPS: 300.0000 mg | ORAL_CAPSULE | Freq: Four times a day (QID) | ORAL | Status: DC

## 2012-05-10 MED ORDER — RISAQUAD PO CAPS: 1.0000 | ORAL_CAPSULE | Freq: Every day | ORAL | Status: DC

## 2012-05-10 MED ORDER — INSULIN GLARGINE 100 UNIT/ML ~~LOC~~ SOLN: 20.0000 [IU] | Freq: Every day | SUBCUTANEOUS | Status: DC

## 2012-05-10 MED ORDER — CLINDAMYCIN HCL 300 MG PO CAPS
300.0000 mg | ORAL_CAPSULE | Freq: Three times a day (TID) | ORAL | Status: DC
  Administered 2012-05-10 – 2012-05-11 (×3): 300 mg via ORAL
  Filled 2012-05-10 (×6): qty 1

## 2012-05-10 MED ORDER — GLIMEPIRIDE 4 MG PO TABS: 2.0000 mg | ORAL_TABLET | Freq: Two times a day (BID) | ORAL | Status: DC

## 2012-05-10 MED ORDER — INSULIN LISPRO 100 UNIT/ML ~~LOC~~ SOLN: SUBCUTANEOUS | Status: DC

## 2012-05-10 MED ORDER — ACETAMINOPHEN 500 MG PO TABS: 1000.0000 mg | ORAL_TABLET | Freq: Three times a day (TID) | ORAL | Status: DC

## 2012-05-10 NOTE — Progress Notes (Signed)
Called to patient's room for complaint of pain at IV site.  IV infiltrated upon assessment.  Discontinued fluids and removed IV.  Hot pack applied and arm elevated.  Will page MD on-call.

## 2012-05-10 NOTE — Progress Notes (Signed)
Clinical Social Work  CSW followed up with patient at bedside to discuss HCPOA. Patient reports she has no needs regarding advanced directives at this time and plans to dc home tomorrow. Patient has CSW contact information if needed. At this time, CSW is signing off but available if needed.  [place], [place] [phone]

## 2012-05-10 NOTE — Progress Notes (Signed)
ANTIBIOTIC CONSULT NOTE - FOLLOW UP  Pharmacy Consult:  Vancomycin / Primaxin Indication:  Groin cellulitis / abscess  Allergies  Allergen Reactions  . Betadine (Povidone Iodine) Itching  . Capoten (Captopril) Other (See Comments)    unknown  . Penicillins Swelling  . Propoxyphene And Methadone     Intolerance to darvocet    Patient Measurements: Height: 5\' 3"  (160 cm) Weight: 237 lb 7 oz (107.7 kg) IBW/kg (Calculated) : 52.4   Vital Signs: Temp: 98.3 F (36.8 C) (11/07 0815) Temp src: Oral (11/07 0815) BP: 146/53 mmHg (11/07 0815) Pulse Rate: 73  (11/07 0815) Intake/Output from previous day: 11/06 0701 - 11/07 0700 In: 958 [P.O.:358; I.V.:200; IV Piggyback:400] Out: -   Labs:  Basename 05/10/12 0500 05/09/12 0639 05/08/12 1232  WBC 11.4* 11.5* 10.7*  HGB 9.1* 8.6* 9.2*  PLT 318 243 271  LABCREA -- -- --  CREATININE 1.56* 1.57* 1.37*   Estimated Creatinine Clearance: 35.5 ml/min (by C-G formula based on Cr of 1.56). No results found for this basename: VANCOTROUGH:2,VANCOPEAK:2,VANCORANDOM:2,GENTTROUGH:2,GENTPEAK:2,GENTRANDOM:2,TOBRATROUGH:2,TOBRAPEAK:2,TOBRARND:2,AMIKACINPEAK:2,AMIKACINTROU:2,AMIKACIN:2, in the last 72 hours   Microbiology: Recent Results (from the past 720 hour(s))  SURGICAL PCR SCREEN     Status: Normal   Collection Time   05/07/12 11:54 AM      Component Value Range Status Comment   MRSA, PCR NEGATIVE  NEGATIVE Final    Staphylococcus aureus NEGATIVE  NEGATIVE Final   TISSUE CULTURE     Status: Normal (Preliminary result)   Collection Time   05/07/12  5:39 PM      Component Value Range Status Comment   Specimen Description TISSUE GROIN RIGHT   Final    Special Requests PATIENT ON FOLLOWING VANCO,CLEOCIN   Final    Gram Stain     Final    Value: ABUNDANT WBC PRESENT, PREDOMINANTLY PMN     ABUNDANT GRAM NEGATIVE RODS     FEW GRAM POSITIVE COCCI IN PAIRS     RARE GRAM POSITIVE RODS   Culture MODERATE STREPTOCOCCUS GROUP G   Final    Report  Status PENDING   Incomplete   ANAEROBIC CULTURE     Status: Normal (Preliminary result)   Collection Time   05/07/12  5:39 PM      Component Value Range Status Comment   Specimen Description TISSUE GROIN RIGHT   Final    Special Requests PATIENT ON FOLLOWING VANCO,CLEOCIN   Final    Gram Stain PENDING   Incomplete    Culture     Final    Value: NO ANAEROBES ISOLATED; CULTURE IN PROGRESS FOR 5 DAYS   Report Status PENDING   Incomplete        Assessment: 18 YOF admitted with groin swelling and was started on vancomycin and Primaxin for cellulitis/abscess, s/p I&D on 05/07/12.  Vancomycin trough was mildly elevated and dose was subsequently adjusted.  Her renal function remains mildly impaired.  Vanc 10/29 >>  Clinda 10/29>11/4 Primaxin 11/4>>  11/4 groin tissue 11/4 - Streptococcus Group G (preliminary)   Goal of Therapy:  Vancomycin trough level 10-15 mcg/ml   Plan:  - Continue vanc 1g IV q24h - Primaxin 250mg  IV q6h - Monitor renal function, CBGs, micro data to deescalate abx (minocycline, clinda) - Recheck vanc trough if abx not adjusted     Tomoya Ringwald D. Laney Potash, PharmD, BCPS Pager:  [phone] 05/10/2012, 8:55 AM

## 2012-05-10 NOTE — Progress Notes (Signed)
3 Days Post-Op  Subjective: Pt is feeling great today.  She is in very little pain controlled with oral pain meds, and tolerating dressing changes well.  Pt is up ambulating OOB, with normal BM and urination.  Pt denies any other complaints.    Objective: Vital signs in last 24 hours: Temp:  [97.2 F (36.2 C)-98.3 F (36.8 C)] 98.3 F (36.8 C) (11/07 0815) Pulse Rate:  [58-73] 73  (11/07 0815) Resp:  [16-20] 16  (11/07 0815) BP: (120-146)/(38-68) 146/53 mmHg (11/07 0815) SpO2:  [93 %-98 %] 93 % (11/07 0815) Weight:  [237 lb 7 oz (107.7 kg)] 237 lb 7 oz (107.7 kg) (11/07 0453) Last BM Date: 05/08/12  Intake/Output from previous day: 11/06 0701 - 11/07 0700 In: 958 [P.O.:358; I.V.:200; IV Piggyback:400] Out: -  Intake/Output this shift:    PE: Gen:  Alert, NAD, pleasant Right Groin:  WD dressing intact C/D/I, minimal pain to palpation  Lab Results:   Basename 05/10/12 0500 05/09/12 0639  WBC 11.4* 11.5*  HGB 9.1* 8.6*  HCT 27.2* 26.1*  PLT 318 243   BMET  Basename 05/10/12 0500 05/09/12 0639  NA 138 133*  K 4.4 4.4  CL 104 101  CO2 24 24  GLUCOSE 76 190*  BUN 17 18  CREATININE 1.56* 1.57*  CALCIUM 8.8 8.4   PT/INR No results found for this basename: LABPROT:2,INR:2 in the last 72 hours CMP     Component Value Date/Time   NA 138 05/10/2012 0500   K 4.4 05/10/2012 0500   CL 104 05/10/2012 0500   CO2 24 05/10/2012 0500   GLUCOSE 76 05/10/2012 0500   BUN 17 05/10/2012 0500   CREATININE 1.56* 05/10/2012 0500   CREATININE 1.68* 05/16/2011 1352   CALCIUM 8.8 05/10/2012 0500   PROT 7.5 05/02/2012 0630   ALBUMIN 2.9* 05/02/2012 0630   AST 22 05/02/2012 0630   ALT 16 05/02/2012 0630   ALKPHOS 88 05/02/2012 0630   BILITOT 0.8 05/02/2012 0630   GFRNONAA 31* 05/10/2012 0500   GFRAA 36* 05/10/2012 0500   Lipase  No results found for this basename: lipase       Studies/Results: No results found.  Anti-infectives: Anti-infectives     Start     Dose/Rate Route  Frequency Ordered Stop   05/07/12 2100   vancomycin (VANCOCIN) IVPB 1000 mg/200 mL premix        1,000 mg 200 mL/hr over 60 Minutes Intravenous Every 24 hours 05/07/12 1055     05/07/12 1200  imipenem-cilastatin (PRIMAXIN) 250 mg in sodium chloride 0.9 % 100 mL IVPB       250 mg 200 mL/hr over 30 Minutes Intravenous 4 times per day 05/07/12 1055     05/07/12 0800   clindamycin (CLEOCIN) capsule 300 mg  Status:  Discontinued        300 mg Oral 3 times per day 05/07/12 0651 05/07/12 1007   05/06/12 2200   vancomycin (VANCOCIN) IVPB 1000 mg/200 mL premix  Status:  Discontinued        1,000 mg 200 mL/hr over 60 Minutes Intravenous Every 24 hours 05/06/12 2047 05/07/12 0651   05/02/12 2000   vancomycin (VANCOCIN) 1,500 mg in sodium chloride 0.9 % 500 mL IVPB  Status:  Discontinued        1,500 mg 250 mL/hr over 120 Minutes Intravenous Every 24 hours 05/01/12 1931 05/06/12 2047   05/01/12 2200   clindamycin (CLEOCIN) IVPB 900 mg  Status:  Discontinued  900 mg 100 mL/hr over 30 Minutes Intravenous 3 times per day 05/01/12 1825 05/07/12 0651   05/01/12 2000   vancomycin (VANCOCIN) 1,500 mg in sodium chloride 0.9 % 500 mL IVPB        1,500 mg 250 mL/hr over 120 Minutes Intravenous  Once 05/01/12 1933 05/01/12 2205   05/01/12 1630   vancomycin (VANCOCIN) 1,500 mg in sodium chloride 0.9 % 500 mL IVPB  Status:  Discontinued        1,500 mg 250 mL/hr over 120 Minutes Intravenous To Emergency Dept 05/01/12 1604 05/01/12 1934   05/01/12 1515   clindamycin (CLEOCIN) IVPB 900 mg        900 mg 100 mL/hr over 30 Minutes Intravenous  Once 05/01/12 1511 05/01/12 1614           Assessment/Plan  78 y/o female s/p I&D of right groin abscess 1.  Pt is doing well from a surgical standpoint and could go home as early as today if medically stable 2.  Pt should start Clindamycin discharge 3.  Pt should f/u with Dr. Michaell Cowing in 1-2 weeks for wound recheck 4.  Home health wound care     LOS: 9  days    DORT, Leana [place] 05/10/2012, 10:02 AM Pager: [phone]

## 2012-05-10 NOTE — Progress Notes (Signed)
Physical Therapy Treatment Patient Details Name: [name] MRN: [id number] DOB: [birth year] Today's Date: 05/10/2012 Time: 4098-1191 PT Time Calculation (min): 10 min  PT Assessment / Plan / Recommendation Comments on Treatment Session  Pt s/p rt groin abscess that was I&D'd on 05/07/12.  Making good progress.    Follow Up Recommendations  Home health PT;Supervision - Intermittent     Does the patient have the potential to tolerate intense rehabilitation     Barriers to Discharge        Equipment Recommendations  None recommended by PT    Recommendations for Other Services    Frequency Min 3X/week   Plan Discharge plan remains appropriate;Frequency remains appropriate    Precautions / Restrictions Precautions Precautions: Fall   Pertinent Vitals/Pain N/A    Mobility  Transfers Sit to Stand: 6: Modified independent (Device/Increase time) Stand to Sit: 6: Modified independent (Device/Increase time) Ambulation/Gait Ambulation/Gait Assistance: 5: Supervision Ambulation Distance (Feet): 300 Feet Assistive device: Rolling walker Gait Pattern: Step-through pattern;Decreased stride length;Wide base of support    Exercises     PT Diagnosis:    PT Problem List:   PT Treatment Interventions:     PT Goals Acute Rehab PT Goals PT Goal: Sit to Stand - Progress: Met PT Goal: Stand to Sit - Progress: Met PT Transfer Goal: Bed to Chair/Chair to Bed - Progress: Progressing toward goal PT Goal: Ambulate - Progress: Progressing toward goal  Visit Information  Last PT Received On: 05/10/12 Assistance Needed: +1    Subjective Data  Subjective: Pt states she is going home tomorrow.   Cognition  Overall Cognitive Status: Appears within functional limits for tasks assessed/performed Arousal/Alertness: Awake/alert Orientation Level: Appears intact for tasks assessed Behavior During Session: [facility] for tasks performed    Balance  Static Standing Balance Static Standing -  Balance Support: No upper extremity supported Static Standing - Level of Assistance: 6: Modified independent (Device/Increase time)  End of Session PT - End of Session Activity Tolerance: Patient tolerated treatment well Patient left: in chair;with call bell/phone within reach   GP     [facility] 05/10/2012, 3:48 PM  [facility] PT [phone]

## 2012-05-10 NOTE — Progress Notes (Signed)
Subjective: POD #3 I and D. Wound packed and according to CCS notes wound looking good. No new C/O CBGs more stable. IV Infiltrated yesterday Still on IV Abx She is doing much better  Objective: Vital signs in last 24 hours: Temp:  [97.2 F (36.2 C)-98.3 F (36.8 C)] 98.3 F (36.8 C) (11/07 0453) Pulse Rate:  [58-71] 62  (11/07 0453) Resp:  [16-20] 20  (11/07 0453) BP: (120-146)/(38-68) 130/68 mmHg (11/07 0453) SpO2:  [94 %-98 %] 94 % (11/07 0453) Weight:  [107.7 kg (237 lb 7 oz)] 107.7 kg (237 lb 7 oz) (11/07 0453) Weight change: -1.4 kg (-3 lb 1.4 oz) Last BM Date: 05/08/12  CBG (last 3)   Basename 05/09/12 2235 05/09/12 1653 05/09/12 1132  GLUCAP 185* 153* 172*    Intake/Output from previous day:  Intake/Output Summary (Last 24 hours) at 05/10/12 0726 Last data filed at 05/10/12 0525  Gross per 24 hour  Intake    958 ml  Output      0 ml  Net    958 ml   11/06 0701 - 11/07 0700 In: 958 [P.O.:358; I.V.:200; IV Piggyback:400] Out: -    Physical Exam  General appearance: Alert  Resp: CTA Cardio: Reg Sternotomy GI: soft, non-tender; bowel sounds normal; no masses,  no organomegaly R groin  Packed and no foul odor.  Much less tender and much more normal skin noted. Extremities: no clubbing, cyanosis or edema   Lab Results:  [facility] 05/09/12 0639 05/08/12 1232  NA 133* 131*  K 4.4 4.3  CL 101 99  CO2 24 23  GLUCOSE 190* 229*  BUN 18 15  CREATININE 1.57* 1.37*  CALCIUM 8.4 8.5  MG -- --  PHOS -- --    No results found for this basename: AST:2,ALT:2,ALKPHOS:2,BILITOT:2,PROT:2,ALBUMIN:2 in the last 72 hours   Basename 05/09/12 0639 05/08/12 1232  WBC 11.5* 10.7*  NEUTROABS -- --  HGB 8.6* 9.2*  HCT 26.1* 28.8*  MCV 93.2 91.4  PLT 243 271    Lab Results  Component Value Date   INR 1.15 05/02/2012   INR 1.79* 10/25/2011   INR 2.24* 10/24/2011    No results found for this basename: CKTOTAL:3,CKMB:3,CKMBINDEX:3,TROPONINI:3 in the last 72  hours  No results found for this basename: TSH,T4TOTAL,FREET3,T3FREE,THYROIDAB in the last 72 hours  No results found for this basename: VITAMINB12:2,FOLATE:2,FERRITIN:2,TIBC:2,IRON:2,RETICCTPCT:2 in the last 72 hours  Micro Results: Recent Results (from the past 240 hour(s))  SURGICAL PCR SCREEN     Status: Normal   Collection Time   05/07/12 11:54 AM      Component Value Range Status Comment   MRSA, PCR NEGATIVE  NEGATIVE Final    Staphylococcus aureus NEGATIVE  NEGATIVE Final   TISSUE CULTURE     Status: Normal (Preliminary result)   Collection Time   05/07/12  5:39 PM      Component Value Range Status Comment   Specimen Description TISSUE GROIN RIGHT   Final    Special Requests PATIENT ON FOLLOWING VANCO,CLEOCIN   Final    Gram Stain     Final    Value: ABUNDANT WBC PRESENT, PREDOMINANTLY PMN     ABUNDANT GRAM NEGATIVE RODS     FEW GRAM POSITIVE COCCI IN PAIRS     RARE GRAM POSITIVE RODS   Culture MODERATE STREPTOCOCCUS GROUP G   Final    Report Status PENDING   Incomplete   ANAEROBIC CULTURE     Status: Normal (Preliminary result)   Collection Time  05/07/12  5:39 PM      Component Value Range Status Comment   Specimen Description TISSUE GROIN RIGHT   Final    Special Requests PATIENT ON FOLLOWING VANCO,CLEOCIN   Final    Gram Stain PENDING   Incomplete    Culture     Final    Value: NO ANAEROBES ISOLATED; CULTURE IN PROGRESS FOR 5 DAYS   Report Status PENDING   Incomplete      Studies/Results: No results found.   Medications: Scheduled:    . acetaminophen  1,000 mg Oral TID  . acidophilus  1 capsule Oral Daily  . amitriptyline  75 mg Oral QHS  . aspirin  81 mg Oral Daily  . atorvastatin  40 mg Oral QPM  . donepezil  5 mg Oral QHS  . glimepiride  2 mg Oral BID WC  . imipenem-cilastatin  250 mg Intravenous Q6H  . insulin aspart  0-15 Units Subcutaneous TID WC  . insulin aspart  0-5 Units Subcutaneous QHS  . insulin glargine  10 Units Subcutaneous BID  .  levothyroxine  100 mcg Oral QAC breakfast  . metoprolol  25 mg Oral BID  . mupirocin ointment   Nasal BID  . pantoprazole  40 mg Oral Daily  . vancomycin  1,000 mg Intravenous Q24H  . cyanocobalamin  1,000 mcg Oral Daily  . Vitamin D  2,000 Units Oral Daily   Continuous:    . lactated ringers 20 mL/hr at 05/10/12 0500     Assessment/Plan: Principal Problem:  *Cellulitis and abscess Active Problems:  Chronic anticoagulation  1. Sig R Thigh CELLULITIS AND ABSCESS OF GROIN .  Still on IV Abx.  POD #2.  Continue wound care.  Still need CCS to advise me when we can change out to PO Abx and when to anticipate D/c. 2. Chronic Anemia - Multifactorial. At 11.4 - 10.7 - 10.1 - 9.5 -9.4 - 9.2 - 8.6.  Will be followed. 3. LE Edema/CVI/venous Stasis - on diuretics. Stable.  Min Edema noted. Cr stable,  4 Obesity - needs weight loss  5. HTN - Stable. 6. Cognitive decline and developing Dementia on Aricept. 7. DM2 with Neuropathy/Non-Prolif DM Retinopathy/Cr 1.3-1.8 without microalbuminuria - A1C = 10. Lantus and ISS - CBGs doing some better    [facility] 05/09/12 2235 05/09/12 1653 05/09/12 1132  GLUCAP 185* 153* 172*   DVT Prophylaxis - SCDs.    LOS: 9 days   Kahner Yanik M 05/10/2012, 7:26 AM

## 2012-05-11 LAB — CBC
Hemoglobin: 9 g/dL — ABNORMAL LOW (ref 12.0–15.0)
MCH: 29.4 pg (ref 26.0–34.0)
MCHC: 31.9 g/dL (ref 30.0–36.0)
MCV: 92.2 fL (ref 78.0–100.0)
Platelets: 286 10*3/uL (ref 150–400)
RBC: 3.06 MIL/uL — ABNORMAL LOW (ref 3.87–5.11)

## 2012-05-11 LAB — BASIC METABOLIC PANEL
BUN: 17 mg/dL (ref 6–23)
CO2: 24 mEq/L (ref 19–32)
Calcium: 8.7 mg/dL (ref 8.4–10.5)
GFR calc non Af Amer: 33 mL/min — ABNORMAL LOW (ref >90)
Glucose, Bld: 173 mg/dL — ABNORMAL HIGH (ref 70–99)

## 2012-05-11 LAB — TISSUE CULTURE

## 2012-05-11 LAB — GLUCOSE, CAPILLARY: Glucose-Capillary: 251 mg/dL — ABNORMAL HIGH (ref 70–99)

## 2012-05-11 MED ORDER — CLINDAMYCIN HCL 300 MG PO CAPS: 300.0000 mg | ORAL_CAPSULE | Freq: Three times a day (TID) | ORAL | Status: DC

## 2012-05-11 NOTE — Discharge Summary (Signed)
Physician Discharge Summary  DISCHARGE SUMMARY   Patient ID: [name] MR#: [id number] DOB/AGE: January 31, 1933 78 y.o.   Attending Physician:Orvan Papadakis M  Patient's ACZ:YSAYT,KZSW M, MD  Consults: **  Admit date: 05/01/2012 Discharge date: 05/11/2012  Discharge Diagnoses:  Principal Problem:  *Cellulitis and abscess Active Problems:  Chronic anticoagulation   Patient Active Problem List  Diagnosis  . Atrial fibrillation  . Hypothyroidism  . Chronic anticoagulation  . CAD (coronary artery disease)  . Diastolic dysfunction  . Anemia associated with chronic renal failure  . Atrial tachycardia  . Right heart failure  . MGUS (monoclonal gammopathy of unknown significance)  . Unspecified deficiency anemia  . GI bleed  . CKD (chronic kidney disease) stage 3, GFR 30-59 ml/min  . Cellulitis and abscess   Past Medical History  Diagnosis Date  . Coronary artery disease     Severe two vessel; s/p CABG  . Hypertension   . Diabetes mellitus     Type 2  . Hyperlipidemia   . Diabetic neuropathy   . PAF (paroxysmal atrial fibrillation)     failed medical therapy with amiodarone; not a candidate for class IC meds due to her CAD. No Multaq due to CHF hx; QT prolonged with amiodarone; Not able to be ablated due to valve disease  . CVA (cerebral vascular accident) 2004  . Hypothyroidism   . Chronic anemia   . Diastolic dysfunction     per echo in May of 2012  . GERD (gastroesophageal reflux disease)   . Arthritis   . Valvular heart disease     has moderate mitral insufficiency with severe tricuspid insufficiency per TEE in November 2012  . Memory loss, short term     Discharged Condition: stable   Discharge Medications:   Medication List     As of 05/11/2012  6:58 AM    TAKE these medications         acetaminophen 500 MG tablet   Commonly known as: TYLENOL   Take 2 tablets (1,000 mg total) by mouth 3 (three) times daily.      acidophilus Caps   Take 1  capsule by mouth daily.      amitriptyline 25 MG tablet   Commonly known as: ELAVIL   Take 75 mg by mouth at bedtime.      aspirin 81 MG tablet   Take 81 mg by mouth daily.      atorvastatin 40 MG tablet   Commonly known as: LIPITOR   Take 40 mg by mouth every evening.      clindamycin 300 MG capsule   Commonly known as: CLEOCIN   Take 1 capsule (300 mg total) by mouth 4 (four) times daily.      clindamycin 300 MG capsule   Commonly known as: CLEOCIN   Take 1 capsule (300 mg total) by mouth 3 (three) times daily.      cyanocobalamin 1000 MCG tablet   Take 1,000 mcg by mouth daily.      donepezil 5 MG tablet   Commonly known as: ARICEPT   Take 5 mg by mouth at bedtime.      esomeprazole 40 MG capsule   Commonly known as: NEXIUM   Take 40 mg by mouth daily before breakfast.      fish oil-omega-3 fatty acids 1000 MG capsule   Take 1 g by mouth daily.      furosemide 20 MG tablet   Commonly known as: LASIX   Take 20 mg  by mouth 2 (two) times daily.      glimepiride 4 MG tablet   Commonly known as: AMARYL   Take 0.5 tablets (2 mg total) by mouth 2 (two) times daily.      insulin glargine 100 UNIT/ML injection   Commonly known as: LANTUS   Inject 20 Units into the skin at bedtime.      insulin lispro 100 UNIT/ML injection   Commonly known as: HUMALOG   Subcutaneous, 3 times daily with meals  Correction coverage: Moderate (average weight, post-op)  CBG < 70: implement hypoglycemia protocol  CBG 70 - 120: 0 units  CBG 121 - 150: 2 units  CBG 151 - 200: 3 units  CBG 201 - 250: 5 units  CBG 251 - 300: 8 units  CBG 301 - 350: 11 units  CBG 351 - 400: 15 units      IRON PO   Take 1 tablet by mouth daily.      levothyroxine 100 MCG tablet   Commonly known as: SYNTHROID, LEVOTHROID   Take 100 mcg by mouth daily.      MAGNESIUM PO   Take 1 tablet by mouth daily.      metoprolol 50 MG tablet   Commonly known as: LOPRESSOR   Take 25 mg by mouth 2 (two)  times daily.      Vitamin D 2000 UNITS tablet   Take 2,000 Units by mouth daily.        Hospital Procedures: Ct Pelvis W Contrast  05/03/2012  *RADIOLOGY REPORT*  Clinical Data:  Right groin cellulitis. Tenderness to touch. Warmth.  CT PELVIS WITH CONTRAST  Technique:  Multidetector CT imaging of the pelvis was performed using the standard protocol following the bolus administration of intravenous contrast.  Contrast: 80mL OMNIPAQUE IOHEXOL 300 MG/ML  SOLN  Comparison:   None.  Findings:  There is soft tissue stranding in the subcutaneous fat of the anterior aspect of the proximal right thigh.  There is no abscess.  No significant adenopathy in the right inguinal region. The area of maximum involvement is approximately 6 cm in diameter.  The underlying muscle structures and bones are normal.  Note is made of numerous diverticula in the colon.  Uterus and ovaries been removed.  Incidental note is made of multiple tiny stones in the gallbladder.  Minimal arthritic changes at both hips.  IMPRESSION: Focal slight cellulitis of the subcutaneous fat of the anterior aspect of the proximal right thigh.  No abscess or adenopathy.  Air is seen in the soft tissues of the anterior aspect of both thighs, probably related to subcutaneous injections.   Original Report Authenticated By: Francene Boyers, M.D.    Dg Chest Port 1 View  05/04/2012  *RADIOLOGY REPORT*  Clinical Data: 78 year old female fever and cough.  PORTABLE CHEST - 1 VIEW  Comparison: 04/06/2011 and earlier.  Findings: Portable upright AP view 1529 hours. Stable cardiomegaly and mediastinal contours.  No pulmonary edema.  No pleural effusion.  No consolidation or confluent pulmonary opacity.  No pneumothorax.  IMPRESSION: Stable cardiomegaly.  No acute cardiopulmonary abnormality.   Original Report Authenticated By: Erskine Speed, M.D.     History of Present Illness: 89 F with mmp sent to my office after being seen for Fatigue and noted to have a R  Upper thigh/groing Fluid collection believed to be a large Abscess.  She started giving her Insulin shots where the collection developed and has been advised not to do so any longer.  In the ED she was Evaluated by Surgery and ED.  Bachar count was mildly high and she was febrile.  They thought there was no abscess and just a large area of cellulitis.  They recommended admission to Medicine and IV Abx.     Hospital Course: She was admitted with Sig Cellulitis and ? Abscess for IV Abx and Local care.  I got Wound care consulted and did a CT scan.  There was no Deep Abscess seen on scan.  She did not progress and a few days later/over the weekend the collection Necrosed and ruptured with thick nasty foul smelling brown D/c drainage.  On Monday I called Surgery back and they took a look at the Wound and took her to the OR for I and D.  She is now POD #4 and doing well.  The Skin looks much better.  No more abscess.  Her Wound is dressed and packed.  She and husband have instructions on how to care for it at home.  Home Health will be set up.  She was switched from IV to PO Abx and will finish 6 more days of Clinda.  No more fevers and WBC back to normal.  She is in very little pain controlled with oral pain meds, and tolerating dressing changes well. Pt has been up ambulating OOB, with normal BM and urination. Pt denies any other complaints. CBGs have ben low here in the hospital requiring many adjustments.  They are more stable lately.  She is doing much better and stable for D/c today with F/Up with Dr Michaell Cowing next week.  1. Sig R Thigh CELLULITIS AND ABSCESS OF GROIN . Finish PO Abx.  Continue wound care. Will D/C home today with appropriate wound care 2. Chronic Anemia - Multifactorial. At 11.4 - 10.7 - 10.1 - 9.5 -9.4 - 9.2 - 8.6 - 9.1 - 9.0. Stable and will be followed.  3. LE Edema/CVI/venous Stasis - on diuretics. Stable. Min Edema noted. Cr stable.  4 Obesity - needs weight loss  5. HTN - Stable.  6.  Cognitive decline and developing Dementia on Aricept.  7. DM2 with Neuropathy/Non-Prolif DM Retinopathy/Cr 1.3-1.8 without microalbuminuria - A1C = 10. Lantus and ISS - CBGs doing some better. 78-199 last 4 and fine. 8.  DVT Prophylaxis - SCDs.    Day of Discharge Exam BP 125/57  Pulse 52  Temp 98.2 F (36.8 C) (Oral)  Resp 20  Ht 5\' 3"  (1.6 m)  Wt 107.6 kg (237 lb 3.4 oz)  BMI 42.02 kg/m2  SpO2 94%  Physical Exam: General appearance: Alert Throat: oropharynx moist without erythema Resp: CTA Cardio: Reg GI: soft, non-tender; bowel sounds normal; no masses,  no organomegaly Extremities: R Groin - C/D/I.  Packed and looks much better.  No pain   Discharge Labs:  [facility] 05/11/12 0435 05/10/12 0500  NA 136 138  K 4.4 4.4  CL 104 104  CO2 24 24  GLUCOSE 173* 76  BUN 17 17  CREATININE 1.48* 1.56*  CALCIUM 8.7 8.8  MG -- --  PHOS -- --   No results found for this basename: AST:2,ALT:2,ALKPHOS:2,BILITOT:2,PROT:2,ALBUMIN:2 in the last 72 hours  Basename 05/11/12 0435 05/10/12 0500  WBC 9.3 11.4*  NEUTROABS -- --  HGB 9.0* 9.1*  HCT 28.2* 27.2*  MCV 92.2 91.9  PLT 286 318   No results found for this basename: CKTOTAL:3,CKMB:3,CKMBINDEX:3,TROPONINI:3 in the last 72 hours No results found for this basename: TSH,T4TOTAL,FREET3,T3FREE,THYROIDAB in the last 72 hours  No results found for this basename: VITAMINB12:2,FOLATE:2,FERRITIN:2,TIBC:2,IRON:2,RETICCTPCT:2 in the last 72 hours Lab Results  Component Value Date   INR 1.15 05/02/2012   INR 1.79* 10/25/2011   INR 2.24* 10/24/2011       Discharge instructions:     Discharge Orders    Future Appointments: Provider: Department: Dept Phone: Center:   05/17/2012 1:00 PM Delcie Roch [facility] MEDICAL [facility]COLOG[facility] [phone] None   05/17/2012 1:30 PM [facility] In[facility] Nurse [facility] [phone] None   06/14/2012 1:00 PM Dava Najjar [facility]  MEDICAL ONCOLOGY [phone] None   06/14/2012 1:30 PM [facility] Nurse [facility]COL[facility] [phone] None   07/12/2012 1:00 PM Beverely Pace [facility] MEDICAL ONCOLOGY [phone] None   07/12/2012 1:30 PM [facility] In[facility] Nurse [facility] MED[facility] ONCOL[facility] [phone] None   08/09/2012 1:30 PM Beverely Pace [facility] MEDICAL ONCOLOGY [phone] None   08/09/2012 2:00 PM Samul Dada, MD [facility] MEDICAL ONCOLOGY [phone] None   08/09/2012 3:15 PM [facility] Nurse [facility]COL[facility] [phone] None     01-Home or Self Care Follow-up Information    Follow up with Ardeth Sportsman., MD. Schedule an appointment as soon as possible for a visit in 1 week. (Follow up sooner if worse)    Contact information:   [address] [place] [address] [phone]           Disposition: Home  Follow-up Appts: Follow-up with Dr. Timothy Lasso at [facility] in 2 weeks.  Call for appointment.  Condition on Discharge: stable  Tests Needing Follow-up: CBC and BMET in 2 weeks.  Time spent in discharge (includes decision making & examination of pt):30 min  Signed: Keanthony Poole M 05/11/2012, 6:58 AM

## 2012-05-11 NOTE — Progress Notes (Signed)
Cellulitis under control.  Wound cleaner.  We will continue to follow her to make sure he continues to improve.  Will need regular dressing care.  Do not think wound VAC is a good idea.  Aggressive diabetic and other health control

## 2012-05-11 NOTE — Progress Notes (Signed)
05/11/12 Patient being discharged home today with husband, Discharge instructions reviewed with patient. Script for po ABT given to patient.

## 2012-05-14 LAB — ANAEROBIC CULTURE

## 2012-05-15 ENCOUNTER — Telehealth (INDEPENDENT_AMBULATORY_CARE_PROVIDER_SITE_OTHER): Payer: Self-pay | Admitting: General Surgery

## 2012-05-15 ENCOUNTER — Telehealth: Payer: Self-pay | Admitting: Medical Oncology

## 2012-05-15 NOTE — Telephone Encounter (Signed)
[name], nurse with [facility], called to ask if Dr. Michaell Cowing would consider changing wet-to-dry dressings to a wound vac.  The wound has serosanguinous drainage, more red than pink, no odor or fever, just quite deep.  Pt has followup appt on 05/21/12.

## 2012-05-15 NOTE — Telephone Encounter (Signed)
Pt's husband called to let us know that pt has been in the hospital for abscess of her groin with surgery. She has labs and injection 05/17/12 and she will not be able to come. I asked when she thinks she will be able to come and she is not sure. She has an appointment with Dr. Michaell Cowing on the 18th. If she would like to come in for labs and injection we can reschedule her appointments. She will call back to let us know if she can come. Dr. Arline Asp notified.

## 2012-05-16 NOTE — Telephone Encounter (Signed)
Called and spoke with [facility] with [facility], orders given. She states orders were already given by Britta Mccreedy. She will get this ordered and come for dressing changes MWF. She also wanted to let me know patient's vitals today were BP- 138/98 Pulse-120 Temp- 99.2. This is while patient was resting in bed. She complained of some diarrhea but no other symptoms. I told her I would run this by Dr Michaell Cowing but patient may need to follow up with PCP with heart rate that high. SHe will make her aware and recheck her tomorrow if wound vac comes in.

## 2012-05-16 NOTE — Telephone Encounter (Signed)
OK to switch to wound vac qMWF or q TTSat as long as the wound is still followed closely

## 2012-05-16 NOTE — Telephone Encounter (Signed)
Agree with above.  Patient should followup with her primary care physician, Dr. Timothy Lasso soon.  Continue usual home meds as per his D/C summary  I would like to see this patient next week to make sure her wound is improving.

## 2012-05-17 ENCOUNTER — Other Ambulatory Visit: Payer: Medicare Other | Admitting: Lab

## 2012-05-17 ENCOUNTER — Ambulatory Visit: Payer: Medicare Other

## 2012-05-21 ENCOUNTER — Telehealth: Payer: Self-pay | Admitting: *Deleted

## 2012-05-21 ENCOUNTER — Ambulatory Visit (INDEPENDENT_AMBULATORY_CARE_PROVIDER_SITE_OTHER): Payer: Medicare Other | Admitting: Surgery

## 2012-05-21 ENCOUNTER — Encounter (INDEPENDENT_AMBULATORY_CARE_PROVIDER_SITE_OTHER): Payer: Self-pay | Admitting: Surgery

## 2012-05-21 ENCOUNTER — Encounter: Payer: Self-pay | Admitting: Oncology

## 2012-05-21 ENCOUNTER — Other Ambulatory Visit: Payer: Self-pay | Admitting: Oncology

## 2012-05-21 ENCOUNTER — Telehealth: Payer: Self-pay | Admitting: Oncology

## 2012-05-21 ENCOUNTER — Other Ambulatory Visit: Payer: Self-pay

## 2012-05-21 VITALS — BP 126/78 | HR 114 | Temp 97.8°F | Resp 18 | Ht 63.0 in | Wt 212.2 lb

## 2012-05-21 DIAGNOSIS — L02415 Cutaneous abscess of right lower limb: Secondary | ICD-10-CM

## 2012-05-21 DIAGNOSIS — L03119 Cellulitis of unspecified part of limb: Secondary | ICD-10-CM

## 2012-05-21 DIAGNOSIS — L02419 Cutaneous abscess of limb, unspecified: Secondary | ICD-10-CM

## 2012-05-21 NOTE — Telephone Encounter (Signed)
patient's husband called back and rescheduled her 05-30-2012 starting at 11:00am

## 2012-05-21 NOTE — Patient Instructions (Addendum)
WOUND CARE  It is important that the wound be kept open.   -Keeping the skin edges apart will allow the wound to gradually heal from the base upwards.   - If the skin edges of the wound close too early, a new fluid pocket can form and infection can occur. -This is the reason to pack deeper wounds with gauze or ribbon -This is why drained wounds cannot be sewed closed right away  A healthy wound should form a lining of bright red "beefy" granulating tissue that will help shrink the wound and help the edges grow new skin into it.   -A little mucus / yellow discharge is normal (the body's natural way to try and form a scab) and should be gently washed off with soap and water with daily dressing changes.  -Green or foul smelling drainage implies bacterial colonization and can slow wound healing - a short course of antibiotic ointment (3-5 days) can help it clear up.  Call the doctor if it does not improve or worsens  -Avoid use of antibiotic ointments for more than a week as they can slow wound healing over time.    -Sometimes other wound care products will be used to reduce need for dressing changes and/or help clean up dirty wounds -Sometimes the surgeon needs to debride the wound in the office to remove dead or infected tissue out of the wound so it can heal more quickly and safely.    Change the dressing at least once a day -Wash the wound with mild soap and water gently every day.  It is good to shower or bathe the wound to help it clean out. -Use clean 4x4 gauze for medium/large wounds or ribbon plain NU-gauze for smaller wounds (it does not need to be sterile, just clean) -Keep the raw wound moist with a little saline or KY (saline) gel on the gauze.  -A dry wound will take longer to heal.  -Keep the skin dry around the wound to prevent breakdown and irritation. -Pack the wound down to the base -The goal is to keep the skin apart, not overpack the wound -Use a Q-tip or blunt-tipped kabob  stick toothpick to push the gauze down to the base in narrow or deep wounds   -Cover with a clean gauze and tape -paper or Medipore tape tend to be gentle on the skin -rotate the orientation of the tape to avoid repeated stress/trauma on the skin -using an ACE or Coban wrap on wounds on arms or legs can be used instead.  Complete all antibiotics through the entire prescription to help the infection heal and prevent new places of infection   Returning the see the surgeon every few weeks is helpful to follow the healing process and help the wound close as fast as possible.  Vacuum-Assisted Closure Therapy Home Guide Vacuum-assisted closure therapy (VAC therapy) is a device that helps wounds heal. It is used on wounds that cannot be closed with stitches. They often heal slowly. VAC therapy helps the wound stay clean and healthy while its edges slowly grow back together. VAC therapy uses a bandage (dressing) that is made of foam. It is put inside the wound. Then, a drape is placed over the wound. This drape sticks to your skin (adhesive) to keep air out. A tube is hooked up to a small pump and is attached to the drape. The pump sucks fluid and germs from the wound. It can also decrease any bad smell that comes  from the wound. RISKS AND COMPLICATIONS VAC therapy is usually safe to use at home. Your skin may get sore from the adhesive drape. That is the most common problem. However, more serious problems can develop, such as:   Bleeding. This can happen if the dressing in the wound comes into contact with blood vessels. A little bleeding may occur when the dressing is being changed. This is normal now and then. Major bleeding can happen if a large blood vessel breaks. This is more likely if you are taking blood-thinning medicine. Emergency surgery may be needed.  Infection. This can happen if the dressing has an air leak that is not repaired within a couple of hours.  Dehydration. This can happen if  the pump sucks out too much body fluid. DRESSING CHANGES Your dressing will have to be changed. Sometimes this is needed once a day. Other times, a dressing change must be done 3 times a week. How often you change your dressing will depend on what your wound is like. A trained caregiver will most likely change the dressing. However, a family member or friend may be trained to change the dressing. Below are steps to change a dressing in order to prevent an infection. The steps apply to you or the person that changes your dressing.  Wash your hands with soap and water before and after each dressing change.  Wear gloves and protective clothing. This may include eye protection.  Do not allow anyone to change your dressing if they have an infection or a skin condition. Even a small cut can be a problem. To change the dressing:   Turn off the pump.  Take off the adhesive drape.  Disconnect the tube from the dressing.  Take out the dressing that is inside the wound. If the dressing sticks, use a germ-free (sterile), saltwater solution to wet the dressing. This helps it come out more easily. If it hurts when the dressing is changed, take pain medicine 30 minutes before the dressing change.  Cleanse the wound with normal saline or sterile water.  Apply a skin barrier film to the skin that will be covered with the drape. This will protect the skin.  Put a new dressing into the wound.  Apply a new drape and tube.  Replace the container in the pump that collects fluid if it is full. Do this at least once per week.  Turn the pump back on.  Your doctor will decide what setting of suction is best. Do not change the settings on the machine without talking to your nurse or doctor. HOME CARE INSTRUCTIONS   The VAC pump has an alarm. It goes off if there are any problems such as a leak.  Ask your caregiver what to do if the alarm goes off.  Call your caregiver right away if the alarm goes off and  you cannot fix the problem.  Do not turn off the pump for more than 2 hours.  Check your wound carefully at each dressing change for signs of infection. Watch for redness, swelling, or any fluid leaking from the wound. If you develop an infection:  You may have to stop VAC therapy.  The wound will need to be cleaned and washed out.  You will have to take antibiotic medicine.  Ask your caregiver what activities you should or should not do while you are getting VAC therapy. This will depend on your particular wound.  Ask if it is okay to turn off the  pump so you can take a shower. If it is okay, make sure the wound is covered with plastic. The wound area must stay dry.  Drink enough fluids to keep your urine clear or pale yellow.  Eat foods that contain a lot of protein. Examples are meat, poultry, seafood, eggs, nuts, beans, and peas. Protein can help your wound heal. SEEK MEDICAL CARE IF:  Your wound itches or hurts.  Dressing changes are often painful or bleeding often occurs.  You have a headache.  You have diarrhea.  You have a sore throat.  You have a rash.  You feel nauseous.  You feel dizzy or weak. SEEK IMMEDIATE MEDICAL CARE IF:   You have very bad pain.  You have bleeding that will not stop.  Your wound smells bad.  You have redness, swelling, or fluid leaking from your wound.  Your alarm goes off and you do not know what to do.  You have a fever. Document Released: 09/12/2011 Document Reviewed: 09/12/2011 [facility] Patient Information 2013 [place], [place].

## 2012-05-21 NOTE — Progress Notes (Signed)
Subjective:     Patient ID: [name], female   DOB: Mar 22, 1933, 78 y.o.   MRN: [id number]  HPI  [name]  [birth year] [id number]  Patient Care Team: [name], MD as PCP - General (Internal Medicine)  This patient is a 78 y.o.female who presents today for surgical evaluation.   Procedure: Incision and drainage of abscess and proximal medial thigh 05/07/2012  Specimen Description TISSUE GROIN RIGHT Special Requests PATIENT ON FOLLOWING VANCO,CLEOCIN Gram Stain ABUNDANT WBC PRESENT, PREDOMINANTLY PMN ABUNDANT GRAM NEGATIVE RODS FEW GRAM POSITIVE COCCI IN PAIRS RARE GRAM POSITIVE RODS Culture MODERATE STREPTOCOCCUS GROUP G Report Status 05/11/2012 FINAL  The patient comes in today with her husband area and feeling better.  Glucose control better.  Husband doing dressing changes twice a day.  Some soaking through into the underwear.  They were hoping to transition over to a wound VAC.  Energy level better.  Wanting to get out more.  Did not know if it was safe.  Patient Active Problem List  Diagnosis  . Atrial fibrillation  . Hypothyroidism  . Chronic anticoagulation  . CAD (coronary artery disease)  . Diastolic dysfunction  . Anemia associated with chronic renal failure  . Atrial tachycardia  . Right heart failure  . MGUS (monoclonal gammopathy of unknown significance)  . Unspecified deficiency anemia  . GI bleed  . CKD (chronic kidney disease) stage 3, GFR 30-59 ml/min  . Abscess of right thigh s/p I&D 05/07/2012    Past Medical History  Diagnosis Date  . Coronary artery disease     Severe two vessel; s/p CABG  . Hypertension   . Diabetes mellitus     Type 2  . Hyperlipidemia   . Diabetic neuropathy   . PAF (paroxysmal atrial fibrillation)     failed medical therapy with amiodarone; not a candidate for class IC meds due to her CAD. No Multaq due to CHF hx; QT prolonged with amiodarone; Not able to be ablated due to valve disease  . CVA (cerebral  vascular accident) 2004  . Hypothyroidism   . Chronic anemia   . Diastolic dysfunction     per echo in May of 2012  . GERD (gastroesophageal reflux disease)   . Arthritis   . Valvular heart disease     has moderate mitral insufficiency with severe tricuspid insufficiency per TEE in November 2012  . Memory loss, short term   . COPD (chronic obstructive pulmonary disease)   . Blood transfusion without reported diagnosis   . Heart attack     Past Surgical History  Procedure Date  . Coronary artery bypass graft 06/2009    X3, LIMA to LAD, SVG to diagonal, SVG to the posterior descending artery.  . Tonsillectomy   . Knee arthroscopy   . Transthoracic echocardiogram 11/12/2010     Ejection fraction felt to be around 50%.  Wall thickness was increased in a pattern of mild LVH.  Moderately dilated left atrium.  Mildly dilated right atrium. Right ventricle was mildly dilated with mildly reduced systolic function  . Cardiac catheterization 06/09/2009    inferior wall hypokinesia with ejection fraction of 55%.  . Abdominal hysterectomy   . Joint replacement   . Tee without cardioversion 05/23/2011    Procedure: TRANSESOPHAGEAL ECHOCARDIOGRAM (TEE);  Surgeon: Lewayne Bunting, MD;  Location: [facility] ENDOSCOPY;  Service: Cardiovascular;  Laterality: N/A;  . Incision and drainage abscess 05/07/2012    Procedure: INCISION AND DRAINAGE ABSCESS;  Surgeon: Salvatore Decent.  Retina Bernardy, MD;  Location: [facility] OR;  Service: General;  Laterality: N/A;  Groin wound    History   Social History  . Marital Status: Married    Spouse Name: N/A    Number of Children: 2  . Years of Education: N/A   Occupational History  . teacher    Social History Main Topics  . Smoking status: Former Games developer  . Smokeless tobacco: Former Neurosurgeon    Quit date: 07/04/1972  . Alcohol Use: No  . Drug Use: No  . Sexually Active: No   Other Topics Concern  . Not on file   Social History Narrative   Lives in [place] with spouse     Family History  Problem Relation Age of Onset  . Colon cancer Mother   . Stroke Mother   . Hypertension Mother   . Cancer Mother     breast  . Cancer Father     colon  . Heart attack Father     x2    Current Outpatient Prescriptions  Medication Sig Dispense Refill  . acetaminophen (TYLENOL) 500 MG tablet Take 2 tablets (1,000 mg total) by mouth 3 (three) times daily.  30 tablet    . amitriptyline (ELAVIL) 25 MG tablet Take 75 mg by mouth at bedtime.        Marland Kitchen aspirin 81 MG tablet Take 81 mg by mouth daily.        Marland Kitchen atorvastatin (LIPITOR) 40 MG tablet Take 40 mg by mouth every evening.       . Cholecalciferol (VITAMIN D) 2000 UNITS tablet Take 2,000 Units by mouth daily.        . clindamycin (CLEOCIN) 300 MG capsule Take 1 capsule (300 mg total) by mouth 4 (four) times daily.  28 capsule  0  . clindamycin (CLEOCIN) 300 MG capsule Take 1 capsule (300 mg total) by mouth 3 (three) times daily.  18 capsule  0  . donepezil (ARICEPT) 5 MG tablet Take 5 mg by mouth at bedtime.      Marland Kitchen esomeprazole (NEXIUM) 40 MG capsule Take 40 mg by mouth daily before breakfast.        . fish oil-omega-3 fatty acids 1000 MG capsule Take 1 g by mouth daily.       . furosemide (LASIX) 20 MG tablet Take 20 mg by mouth 2 (two) times daily.      Marland Kitchen glimepiride (AMARYL) 4 MG tablet Take 0.5 tablets (2 mg total) by mouth 2 (two) times daily.      . insulin glargine (LANTUS) 100 UNIT/ML injection Inject 20 Units into the skin at bedtime.  10 mL    . insulin lispro (HUMALOG) 100 UNIT/ML injection Subcutaneous, 3 times daily with meals Correction coverage: Moderate (average weight, post-op) CBG < 70: implement hypoglycemia protocol CBG 70 - 120: 0 units CBG 121 - 150: 2 units CBG 151 - 200: 3 units CBG 201 - 250: 5 units CBG 251 - 300: 8 units CBG 301 - 350: 11 units CBG 351 - 400: 15 units  10 mL    . IRON PO Take 1 tablet by mouth daily.       Marland Kitchen levothyroxine (SYNTHROID, LEVOTHROID) 100 MCG tablet Take 100  mcg by mouth daily.      Marland Kitchen MAGNESIUM PO Take 1 tablet by mouth daily.       . metoprolol (LOPRESSOR) 50 MG tablet Take 25 mg by mouth 2 (two) times daily.      . [DISCONTINUED]  enoxaparin (LOVENOX) 100 MG/ML SOLN Inject into the skin every 12 (twelve) hours.        . [DISCONTINUED] simvastatin (ZOCOR) 80 MG tablet Take 80 mg by mouth at bedtime.           Allergies  Allergen Reactions  . Betadine (Povidone Iodine) Itching  . Capoten (Captopril) Other (Alexander Comments)    unknown  . Penicillins Swelling  . Propoxyphene And Methadone     Intolerance to darvocet    BP 126/78  Pulse 114  Temp 97.8 F (36.6 C) (Temporal)  Resp 18  Ht 5\' 3"  (1.6 m)  Wt 212 lb 3.2 oz (96.253 kg)  BMI 37.59 kg/m2  Ct Pelvis W Contrast  05/03/2012  *RADIOLOGY REPORT*  Clinical Data:  Right groin cellulitis. Tenderness to touch. Warmth.  CT PELVIS WITH CONTRAST  Technique:  Multidetector CT imaging of the pelvis was performed using the standard protocol following the bolus administration of intravenous contrast.  Contrast: 80mL OMNIPAQUE IOHEXOL 300 MG/ML  SOLN  Comparison:   None.  Findings:  There is soft tissue stranding in the subcutaneous fat of the anterior aspect of the proximal right thigh.  There is no abscess.  No significant adenopathy in the right inguinal region. The area of maximum involvement is approximately 6 cm in diameter.  The underlying muscle structures and bones are normal.  Note is made of numerous diverticula in the colon.  Uterus and ovaries been removed.  Incidental note is made of multiple tiny stones in the gallbladder.  Minimal arthritic changes at both hips.  IMPRESSION: Focal slight cellulitis of the subcutaneous fat of the anterior aspect of the proximal right thigh.  No abscess or adenopathy.  Air is seen in the soft tissues of the anterior aspect of both thighs, probably related to subcutaneous injections.   Original Report Authenticated By: Francene Boyers, M.D.    Dg Chest Port 1  View  05/04/2012  *RADIOLOGY REPORT*  Clinical Data: 78 year old female fever and cough.  PORTABLE CHEST - 1 VIEW  Comparison: 04/06/2011 and earlier.  Findings: Portable upright AP view 1529 hours. Stable cardiomegaly and mediastinal contours.  No pulmonary edema.  No pleural effusion.  No consolidation or confluent pulmonary opacity.  No pneumothorax.  IMPRESSION: Stable cardiomegaly.  No acute cardiopulmonary abnormality.   Original Report Authenticated By: Erskine Speed, M.D.      Review of Systems  Constitutional: Negative for fever, chills, diaphoresis, appetite change and fatigue.  HENT: Negative for ear pain, sore throat and trouble swallowing.   Eyes: Negative for photophobia and visual disturbance.  Respiratory: Negative for cough and choking.   Cardiovascular: Negative for chest pain and palpitations.  Gastrointestinal: Negative for nausea, vomiting, abdominal pain, diarrhea, constipation, anal bleeding and rectal pain.  Genitourinary: Negative for dysuria, frequency and difficulty urinating.  Musculoskeletal: Positive for myalgias and arthralgias. Negative for gait problem.  Skin: Positive for wound. Negative for color change, pallor and rash.  Neurological: Negative for dizziness, speech difficulty, weakness and numbness.  Hematological: Negative for adenopathy.  Psychiatric/Behavioral: Negative for confusion and agitation. The patient is not nervous/anxious.        Objective:   Physical Exam  Constitutional: She is oriented to person, place, and time. She appears well-developed and well-nourished. No distress.  HENT:  Head: Normocephalic.  Mouth/Throat: Oropharynx is clear and moist. No oropharyngeal exudate.  Eyes: Conjunctivae normal and EOM are normal. Pupils are equal, round, and reactive to light. No scleral icterus.  Neck: Normal range  of motion. No tracheal deviation present.  Cardiovascular: Normal rate and intact distal pulses.   Pulmonary/Chest: Effort normal. No  respiratory distress. She exhibits no tenderness.  Abdominal: Soft. She exhibits no distension. There is no tenderness. No hernia. Hernia confirmed negative in the right inguinal area and confirmed negative in the left inguinal area.         Obese but soft.  No hernias  Genitourinary: No vaginal discharge found.  Musculoskeletal: Normal range of motion. She exhibits no tenderness.  Lymphadenopathy:       Right: No inguinal adenopathy present.       Left: No inguinal adenopathy present.  Neurological: She is alert and oriented to person, place, and time. No cranial nerve deficit. She exhibits normal muscle tone. Coordination normal.  Skin: Skin is warm and dry. No rash noted. She is not diaphoretic.  Psychiatric: She has a normal mood and affect. Her behavior is normal.       Assessment:     Status post incision and drainage of deep proximal right thigh abscess.  Markedly improved.    Plan:     Increase activity as tolerated to regular activity.  Do not push through pain.  Continue dressing changes.  Can change frequency of packing to once a day.  Change outer dressing 2-3 times a day to keep the area clean and dry.  OK to transition to wound VAC as we had discussed last week.  Diet as tolerated. Bowel regimen to avoid problems.  Return to clinic q2-3 weeks until wound closed.   Instructions discussed.  Followup with primary care physician for other health issues as would normally be done.  Questions answered.  The patient expressed understanding and appreciation

## 2012-05-21 NOTE — Telephone Encounter (Signed)
Added lb to 11/21 infusion per desk nurse. S/w pt re add on and new time for 11/21 @ 8:45am. Pt wants to change inf date and also inj appts are off. Pt aware I will call her back.

## 2012-05-21 NOTE — Telephone Encounter (Signed)
S/w wife/husband re next appt for 11/22 and pt will get new schedule when she comes in. Per 11/18 pof r/s lb/inf from 11/1. Per desk nurse pt should still get lb/inj q4w and f/u should be in February - last lb/inj was 10/18. Pt wanted to move lb/inf to 11/22 or next wk and agrees to come in this week (11/22) for lb/inf so she can get inj also and stay on schedule w/remaining lb/inj appts.

## 2012-05-21 NOTE — Progress Notes (Signed)
The patient missed her Feraheme appointment scheduled for 05/04/2012.  Feraheme 510 mg IV was rescheduled for 05/24/2012.

## 2012-05-21 NOTE — Telephone Encounter (Signed)
Per staff phone call from desk RN I have scheduled appts.  JMW

## 2012-05-21 NOTE — Telephone Encounter (Signed)
patient confirmed over the phone the new date and time on 06-21-2012

## 2012-05-24 ENCOUNTER — Other Ambulatory Visit: Payer: BC Managed Care – PPO | Admitting: Lab

## 2012-05-24 ENCOUNTER — Ambulatory Visit: Payer: BC Managed Care – PPO

## 2012-05-25 ENCOUNTER — Telehealth: Payer: Self-pay | Admitting: Cardiology

## 2012-05-25 ENCOUNTER — Telehealth: Payer: Self-pay | Admitting: Medical Oncology

## 2012-05-25 ENCOUNTER — Other Ambulatory Visit (HOSPITAL_BASED_OUTPATIENT_CLINIC_OR_DEPARTMENT_OTHER): Payer: Medicare Other | Admitting: Lab

## 2012-05-25 ENCOUNTER — Other Ambulatory Visit: Payer: Self-pay

## 2012-05-25 ENCOUNTER — Inpatient Hospital Stay (HOSPITAL_COMMUNITY)
Admission: EM | Admit: 2012-05-25 | Discharge: 2012-05-30 | DRG: 308 | Disposition: A | Discharge status: Home Health | Payer: Medicare Other | Attending: Cardiology | Admitting: Cardiology

## 2012-05-25 ENCOUNTER — Ambulatory Visit: Payer: Medicare Other

## 2012-05-25 ENCOUNTER — Encounter (HOSPITAL_COMMUNITY): Payer: Self-pay | Admitting: *Deleted

## 2012-05-25 ENCOUNTER — Emergency Department (HOSPITAL_COMMUNITY): Payer: Medicare Other

## 2012-05-25 DIAGNOSIS — I509 Heart failure, unspecified: Secondary | ICD-10-CM | POA: Diagnosis present

## 2012-05-25 DIAGNOSIS — I2510 Atherosclerotic heart disease of native coronary artery without angina pectoris: Secondary | ICD-10-CM | POA: Diagnosis present

## 2012-05-25 DIAGNOSIS — E1149 Type 2 diabetes mellitus with other diabetic neurological complication: Secondary | ICD-10-CM | POA: Diagnosis present

## 2012-05-25 DIAGNOSIS — I38 Endocarditis, valve unspecified: Secondary | ICD-10-CM | POA: Diagnosis present

## 2012-05-25 DIAGNOSIS — I4891 Unspecified atrial fibrillation: Secondary | ICD-10-CM

## 2012-05-25 DIAGNOSIS — I4892 Unspecified atrial flutter: Secondary | ICD-10-CM

## 2012-05-25 DIAGNOSIS — I5189 Other ill-defined heart diseases: Secondary | ICD-10-CM

## 2012-05-25 DIAGNOSIS — D631 Anemia in chronic kidney disease: Secondary | ICD-10-CM

## 2012-05-25 DIAGNOSIS — Z951 Presence of aortocoronary bypass graft: Secondary | ICD-10-CM

## 2012-05-25 DIAGNOSIS — L02415 Cutaneous abscess of right lower limb: Secondary | ICD-10-CM | POA: Diagnosis present

## 2012-05-25 DIAGNOSIS — Z87891 Personal history of nicotine dependence: Secondary | ICD-10-CM

## 2012-05-25 DIAGNOSIS — N039 Chronic nephritic syndrome with unspecified morphologic changes: Secondary | ICD-10-CM

## 2012-05-25 DIAGNOSIS — I5023 Acute on chronic systolic (congestive) heart failure: Secondary | ICD-10-CM | POA: Diagnosis present

## 2012-05-25 DIAGNOSIS — J4489 Other specified chronic obstructive pulmonary disease: Secondary | ICD-10-CM | POA: Diagnosis present

## 2012-05-25 DIAGNOSIS — Z79899 Other long term (current) drug therapy: Secondary | ICD-10-CM

## 2012-05-25 DIAGNOSIS — N184 Chronic kidney disease, stage 4 (severe): Secondary | ICD-10-CM | POA: Diagnosis present

## 2012-05-25 DIAGNOSIS — Z8673 Personal history of transient ischemic attack (TIA), and cerebral infarction without residual deficits: Secondary | ICD-10-CM

## 2012-05-25 DIAGNOSIS — K219 Gastro-esophageal reflux disease without esophagitis: Secondary | ICD-10-CM | POA: Diagnosis present

## 2012-05-25 DIAGNOSIS — I5022 Chronic systolic (congestive) heart failure: Secondary | ICD-10-CM | POA: Diagnosis present

## 2012-05-25 DIAGNOSIS — Z794 Long term (current) use of insulin: Secondary | ICD-10-CM

## 2012-05-25 DIAGNOSIS — J449 Chronic obstructive pulmonary disease, unspecified: Secondary | ICD-10-CM | POA: Diagnosis present

## 2012-05-25 DIAGNOSIS — L02419 Cutaneous abscess of limb, unspecified: Secondary | ICD-10-CM | POA: Diagnosis present

## 2012-05-25 DIAGNOSIS — E785 Hyperlipidemia, unspecified: Secondary | ICD-10-CM | POA: Diagnosis present

## 2012-05-25 DIAGNOSIS — N189 Chronic kidney disease, unspecified: Secondary | ICD-10-CM

## 2012-05-25 DIAGNOSIS — E1142 Type 2 diabetes mellitus with diabetic polyneuropathy: Secondary | ICD-10-CM | POA: Diagnosis present

## 2012-05-25 DIAGNOSIS — I5033 Acute on chronic diastolic (congestive) heart failure: Secondary | ICD-10-CM

## 2012-05-25 DIAGNOSIS — E876 Hypokalemia: Secondary | ICD-10-CM | POA: Diagnosis present

## 2012-05-25 DIAGNOSIS — E039 Hypothyroidism, unspecified: Secondary | ICD-10-CM | POA: Diagnosis present

## 2012-05-25 DIAGNOSIS — Z880 Allergy status to penicillin: Secondary | ICD-10-CM

## 2012-05-25 DIAGNOSIS — I50810 Right heart failure, unspecified: Secondary | ICD-10-CM

## 2012-05-25 DIAGNOSIS — Z23 Encounter for immunization: Secondary | ICD-10-CM

## 2012-05-25 HISTORY — DX: Chronic combined systolic (congestive) and diastolic (congestive) heart failure: I50.42

## 2012-05-25 HISTORY — DX: Unspecified atrial flutter: I48.92

## 2012-05-25 HISTORY — DX: Rheumatic tricuspid insufficiency: I07.1

## 2012-05-25 HISTORY — DX: Nonrheumatic mitral (valve) insufficiency: I34.0

## 2012-05-25 LAB — CBC WITH DIFFERENTIAL/PLATELET
BASO%: 0.6 % (ref 0.0–2.0)
Basophils Absolute: 0.1 10*3/uL (ref 0.0–0.1)
Basophils Absolute: 0 10*3/uL (ref 0.0–0.1)
Eosinophils Absolute: 0.2 10*3/uL (ref 0.0–0.7)
Eosinophils Relative: 3 % (ref 0–5)
HCT: 32 % — ABNORMAL LOW (ref 34.8–46.6)
HGB: 10.5 g/dL — ABNORMAL LOW (ref 11.6–15.9)
LYMPH%: 26.7 % (ref 14.0–49.7)
MCH: 30.4 pg (ref 26.0–34.0)
MCHC: 32.8 g/dL (ref 31.5–36.0)
MCHC: 33.6 g/dL (ref 30.0–36.0)
MCV: 90.6 fL (ref 78.0–100.0)
MONO#: 0.8 10*3/uL (ref 0.1–0.9)
NEUT%: 59.1 % (ref 38.4–76.8)
Platelets: 278 10*3/uL (ref 145–400)
Platelets: 265 10*3/uL (ref 150–400)
RDW: 14.5 % (ref 11.5–15.5)
WBC: 8.4 10*3/uL (ref 3.9–10.3)
lymph#: 2.3 10*3/uL (ref 0.9–3.3)

## 2012-05-25 LAB — BASIC METABOLIC PANEL
Calcium: 9.2 mg/dL (ref 8.4–10.5)
GFR calc non Af Amer: 31 mL/min — ABNORMAL LOW (ref >90)
Sodium: 134 mEq/L — ABNORMAL LOW (ref 135–145)

## 2012-05-25 LAB — PROTIME-INR: Prothrombin Time: 14.2 seconds (ref 11.6–15.2)

## 2012-05-25 MED ORDER — GLIMEPIRIDE 2 MG PO TABS
2.0000 mg | ORAL_TABLET | Freq: Two times a day (BID) | ORAL | Status: DC
  Administered 2012-05-26 – 2012-05-29 (×7): 2 mg via ORAL
  Filled 2012-05-25 (×10): qty 1

## 2012-05-25 MED ORDER — INSULIN GLARGINE 100 UNIT/ML ~~LOC~~ SOLN
35.0000 [IU] | Freq: Two times a day (BID) | SUBCUTANEOUS | Status: DC
  Administered 2012-05-26 (×2): 35 [IU] via SUBCUTANEOUS
  Administered 2012-05-26: 30 [IU] via SUBCUTANEOUS
  Administered 2012-05-27 – 2012-05-29 (×5): 35 [IU] via SUBCUTANEOUS

## 2012-05-25 MED ORDER — ATORVASTATIN CALCIUM 40 MG PO TABS
40.0000 mg | ORAL_TABLET | Freq: Every evening | ORAL | Status: DC
  Administered 2012-05-26 – 2012-05-29 (×4): 40 mg via ORAL
  Filled 2012-05-25 (×5): qty 1

## 2012-05-25 MED ORDER — ACETAMINOPHEN 325 MG PO TABS: 650.0000 mg | ORAL_TABLET | ORAL | Status: DC | PRN

## 2012-05-25 MED ORDER — WARFARIN - PHARMACIST DOSING INPATIENT: Freq: Every day | Status: DC

## 2012-05-25 MED ORDER — DILTIAZEM HCL 25 MG/5ML IV SOLN
20.0000 mg | Freq: Once | INTRAVENOUS | Status: AC
  Administered 2012-05-25: 20 mg via INTRAVENOUS
  Filled 2012-05-25: qty 5

## 2012-05-25 MED ORDER — LEVOTHYROXINE SODIUM 100 MCG PO TABS
100.0000 ug | ORAL_TABLET | Freq: Every day | ORAL | Status: DC
  Administered 2012-05-26 – 2012-05-30 (×5): 100 ug via ORAL
  Filled 2012-05-25 (×6): qty 1

## 2012-05-25 MED ORDER — ASPIRIN EC 81 MG PO TBEC
81.0000 mg | DELAYED_RELEASE_TABLET | Freq: Every day | ORAL | Status: DC
  Administered 2012-05-26 – 2012-05-30 (×5): 81 mg via ORAL
  Filled 2012-05-25 (×5): qty 1

## 2012-05-25 MED ORDER — ONDANSETRON HCL 4 MG/2ML IJ SOLN: 4.0000 mg | Freq: Four times a day (QID) | INTRAMUSCULAR | Status: DC | PRN

## 2012-05-25 MED ORDER — PANTOPRAZOLE SODIUM 40 MG PO TBEC
40.0000 mg | DELAYED_RELEASE_TABLET | Freq: Every day | ORAL | Status: DC
  Administered 2012-05-26 – 2012-05-30 (×5): 40 mg via ORAL
  Filled 2012-05-25 (×5): qty 1

## 2012-05-25 MED ORDER — DONEPEZIL HCL 5 MG PO TABS
5.0000 mg | ORAL_TABLET | Freq: Every day | ORAL | Status: DC
  Administered 2012-05-26 – 2012-05-29 (×5): 5 mg via ORAL
  Filled 2012-05-25 (×6): qty 1

## 2012-05-25 MED ORDER — ENOXAPARIN SODIUM 100 MG/ML ~~LOC~~ SOLN
95.0000 mg | SUBCUTANEOUS | Status: DC
  Filled 2012-05-25: qty 1

## 2012-05-25 MED ORDER — NITROGLYCERIN 0.4 MG SL SUBL: 0.4000 mg | SUBLINGUAL_TABLET | SUBLINGUAL | Status: DC | PRN

## 2012-05-25 MED ORDER — WARFARIN SODIUM 7.5 MG PO TABS
7.5000 mg | ORAL_TABLET | ORAL | Status: DC
  Filled 2012-05-25 (×2): qty 1

## 2012-05-25 MED ORDER — AMITRIPTYLINE HCL 75 MG PO TABS
75.0000 mg | ORAL_TABLET | Freq: Every day | ORAL | Status: DC
  Administered 2012-05-26 – 2012-05-29 (×5): 75 mg via ORAL
  Filled 2012-05-25 (×6): qty 1

## 2012-05-25 MED ORDER — METOPROLOL TARTRATE 25 MG PO TABS
75.0000 mg | ORAL_TABLET | Freq: Two times a day (BID) | ORAL | Status: DC
  Administered 2012-05-26 (×3): 75 mg via ORAL
  Filled 2012-05-25 (×5): qty 3

## 2012-05-25 MED ORDER — DILTIAZEM HCL 100 MG IV SOLR
5.0000 mg/h | Freq: Once | INTRAVENOUS | Status: AC
  Administered 2012-05-25: 5 mg/h via INTRAVENOUS

## 2012-05-25 MED ORDER — ASPIRIN EC 81 MG PO TBEC: 81.0000 mg | DELAYED_RELEASE_TABLET | Freq: Every day | ORAL | Status: DC

## 2012-05-25 NOTE — Telephone Encounter (Signed)
Pt here this am to get IV Iron and her heart rate was 168. Pt was instructed to go home take her metoprolol and call her MD. I called to follow up and she states she took her medication but she has not taken her pulse or has she called her cardiologist. She sees Dr. [place] and has history or multiple heart issues. I informed pt I will call Dr. Elvis Coil office. I called and spoke with Elnita Maxwell Dr. Elvis Coil nurse. I explained the situation with rate of 168. She is going to call pt and follow up with her. Dr. Arline Asp notified.

## 2012-05-25 NOTE — H&P (Addendum)
[name] is an 78 y.o. female.    Chief Complaint: None  HPI: 78 y/o female with a PMH of PAF presenting with new onset atrial flutter.  Regarding her PAF, she has failed Amiodarone therapy in the past due to prolonged QT and has not been in Multaq in the past due to heart failure; she has not undergone ablation therapy to severe tricuspid regurgitation.  Patient has been rate controlled with Metoprolol 50 mg bid and reportedly on Coumadin for anti-coagulation (although this is not on her current drug list). Patient went for iron infusion therapy today when she was noted to be tachycardic. She was not symptomatic.  In the ER, she was in atrial flutter with rapid ventricular response rate (166 bpm). She received Cardizem 20 mg iv followed by Cardizem drip.  Her repeat ECG showed Aflutter with 2:1 AV block.  Currently, she denies any chest pain, shortness of breath, PND, orthopnea, palpitation or syncope.  Past Medical History  Diagnosis Date  . Coronary artery disease     Severe two vessel; s/p CABG  . Hypertension   . Diabetes mellitus     Type 2  . Hyperlipidemia   . Diabetic neuropathy   . PAF (paroxysmal atrial fibrillation)     failed medical therapy with amiodarone; not a candidate for class IC meds due to her CAD. No Multaq due to CHF hx; QT prolonged with amiodarone; Not able to be ablated due to valve disease  . CVA (cerebral vascular accident) 2004  . Hypothyroidism   . Chronic anemia   . Diastolic dysfunction     per echo in May of 2012  . GERD (gastroesophageal reflux disease)   . Arthritis   . Valvular heart disease     has moderate mitral insufficiency with severe tricuspid insufficiency per TEE in November 2012  . Memory loss, short term   . COPD (chronic obstructive pulmonary disease)   . Blood transfusion without reported diagnosis   . Heart attack     Past Surgical History  Procedure Date  . Coronary artery bypass graft 06/2009    X3, LIMA to LAD, SVG to  diagonal, SVG to the posterior descending artery.  . Tonsillectomy   . Knee arthroscopy   . Transthoracic echocardiogram 11/12/2010     Ejection fraction felt to be around 50%.  Wall thickness was increased in a pattern of mild LVH.  Moderately dilated left atrium.  Mildly dilated right atrium. Right ventricle was mildly dilated with mildly reduced systolic function  . Cardiac catheterization 06/09/2009    inferior wall hypokinesia with ejection fraction of 55%.  . Abdominal hysterectomy   . Joint replacement   . Tee without cardioversion 05/23/2011    Procedure: TRANSESOPHAGEAL ECHOCARDIOGRAM (TEE);  Surgeon: Lewayne Bunting, MD;  Location: [facility] ENDOSCOPY;  Service: Cardiovascular;  Laterality: N/A;  . Incision and drainage abscess 05/07/2012    Procedure: INCISION AND DRAINAGE ABSCESS;  Surgeon: Ardeth Sportsman, MD;  Location: [facility] OR;  Service: General;  Laterality: N/A;  Groin wound    Family History  Problem Relation Age of Onset  . Colon cancer Mother   . Stroke Mother   . Hypertension Mother   . Cancer Mother     breast  . Cancer Father     colon  . Heart attack Father     x2   Social History:  reports that she has quit smoking. She quit smokeless tobacco use about 39 years ago. She reports that  she does not drink alcohol or use illicit drugs.  Allergies:  Allergies  Allergen Reactions  . Betadine (Povidone Iodine) Itching  . Capoten (Captopril) Other (See Comments)    unknown  . Penicillins Swelling  . Propoxyphene And Methadone     Intolerance to darvocet     (Not in a hospital admission)  Results for orders placed during the hospital encounter of 05/25/12 (from the past 48 hour(s))  CBC WITH DIFFERENTIAL     Status: Abnormal   Collection Time   05/25/12  7:09 PM      Component Value Range Comment   WBC 8.2  4.0 - 10.5 K/uL    RBC 3.39 (*) 3.87 - 5.11 MIL/uL    Hemoglobin 10.3 (*) 12.0 - 15.0 g/dL    HCT 14.7 (*) 82.9 - 46.0 %    MCV 90.6  78.0 - 100.0 fL     MCH 30.4  26.0 - 34.0 pg    MCHC 33.6  30.0 - 36.0 g/dL    RDW 56.2  13.0 - 86.5 %    Platelets 265  150 - 400 K/uL    Neutrophils Relative 60  43 - 77 %    Neutro Abs 4.9  1.7 - 7.7 K/uL    Lymphocytes Relative 30  12 - 46 %    Lymphs Abs 2.5  0.7 - 4.0 K/uL    Monocytes Relative 8  3 - 12 %    Monocytes Absolute 0.6  0.1 - 1.0 K/uL    Eosinophils Relative 3  0 - 5 %    Eosinophils Absolute 0.2  0.0 - 0.7 K/uL    Basophils Relative 0  0 - 1 %    Basophils Absolute 0.0  0.0 - 0.1 K/uL   BASIC METABOLIC PANEL     Status: Abnormal   Collection Time   05/25/12  7:09 PM      Component Value Range Comment   Sodium 134 (*) 135 - 145 mEq/L    Potassium 3.9  3.5 - 5.1 mEq/L    Chloride 96  96 - 112 mEq/L    CO2 27  19 - 32 mEq/L    Glucose, Bld 227 (*) 70 - 99 mg/dL    BUN 33 (*) 6 - 23 mg/dL    Creatinine, Ser 7.84 (*) 0.50 - 1.10 mg/dL    Calcium 9.2  8.4 - 69.6 mg/dL    GFR calc non Af Amer 31 (*) >90 mL/min    GFR calc Af Amer 36 (*) >90 mL/min   PROTIME-INR     Status: Normal   Collection Time   05/25/12  7:09 PM      Component Value Range Comment   Prothrombin Time 14.2  11.6 - 15.2 seconds    INR 1.11  0.00 - 1.49    Dg Chest Portable 1 View  05/25/2012  *RADIOLOGY REPORT*  Clinical Data: Tachycardia  PORTABLE CHEST - 1 VIEW  Comparison: 05/04/2012  Findings: Previous median sternotomy.  Stable cardiomegaly.  Low lung volumes with crowding of perihilar and bibasilar bronchovascular structures.  No confluent airspace infiltrate.  No effusion.  IMPRESSION:  Stable cardiomegaly   Original Report Authenticated By: D. Andria Rhein, MD     Review of Systems  Constitutional: Negative for fever, chills, weight loss, malaise/fatigue and diaphoresis.  HENT: Negative for hearing loss, ear pain, nosebleeds, congestion, sore throat, neck pain, tinnitus and ear discharge.   Eyes: Negative for blurred vision, double vision, photophobia, pain,  discharge and redness.  Respiratory: Positive  for shortness of breath. Negative for cough, hemoptysis, sputum production, wheezing and stridor.   Cardiovascular: Positive for palpitations and leg swelling. Negative for chest pain, orthopnea, claudication and PND.  Gastrointestinal: Negative for heartburn, nausea, vomiting, abdominal pain, diarrhea, constipation and blood in stool.  Genitourinary: Negative for urgency and frequency.  Musculoskeletal: Negative for myalgias, back pain and joint pain.  Skin: Negative for itching.  Neurological: Negative for dizziness, tingling, weakness and headaches.  Psychiatric/Behavioral: Negative for depression, hallucinations and substance abuse.    Blood pressure 118/55, pulse 84, temperature 97.9 F (36.6 C), resp. rate 17, SpO2 98.00%. Physical Exam  Constitutional: She is oriented to person, place, and time. She appears well-developed and well-nourished. No distress.  HENT:  Head: Normocephalic and atraumatic.  Eyes: Pupils are equal, round, and reactive to light. Right eye exhibits no discharge. Left eye exhibits no discharge.  Neck: Normal range of motion. Neck supple. No JVD present.  Cardiovascular: Normal rate and regular rhythm.  Exam reveals no gallop and no friction rub.   Murmur heard.      2/6 systolic ejection murmur at left sternal border, increases with inspiration.  Respiratory: Effort normal and breath sounds normal. No stridor. No respiratory distress. She has no wheezes. She has no rales.  GI: She exhibits no distension. There is no tenderness. There is no rebound and no guarding.  Musculoskeletal: She exhibits edema. She exhibits no tenderness.  Neurological: She is alert and oriented to person, place, and time.  Skin: No rash noted. She is not diaphoretic. There is erythema.  Psychiatric: She has a normal mood and affect.    Transesophageal Echocardiogram (05/23/2011: LVEF 55%, RV moderately dilated with mildly decreased contraction, moderate MR, severe  TR  Assessment/Plan  1. Atrial Flutter with rapid ventricular response rate (now in 2:1 AV block) 2. Right heart failure 3. CAD s/p CABG 4. Chronic kidney disease (stage 3) 5. IDDM 6. Hypokalemia  I will admit the patient to cardiology service for telemetry observation. I will obtain serial cardiac markers to rule out MI.  I will increase her Metoprolol from 50 mg to 75 mg bid for rate and wean Cardizem drip.  I will initiate anti-coagulation with Heparin per pharmacy consult.  We will replace her potassium with K-dur. We will re-evaluate the patient and her labs in the morning to determine how to proceed with her care.  Davaris Youtsey E 05/25/2012, 11:38 PM

## 2012-05-25 NOTE — Telephone Encounter (Signed)
Patient called stated she went to have a blood transfusion today but was unable to have due to heart rate 168 beats/min.States did not take her metoprolol before she went.States when she got home she took metoprolol 50 mg 1/2 tablet.States her son checked her heart rate with a wrist monitor appox 1 hour ago and heart rate 161 beats/min.States she feels a little dizzy,no energy.Appointment scheduled with Norma Fredrickson NP Monday 05/28/12.  Spoke to DOD Dr.Ross she advised go to [facility] ER.Husband stated he will take wife to ER and wants to keep appointment with Norma Fredrickson NP 05/28/12.

## 2012-05-25 NOTE — ED Provider Notes (Signed)
History     CSN: [id number]  Arrival date & time 05/25/12  1819   First MD Initiated Contact with Patient 05/25/12 1835      Chief Complaint  Patient presents with  . Tachycardia   HPI: Ms. Shrieves is a 78 yo AAF with pertinent history of CAD s/p CABG, DM, PAF, hypothyroidism, mitral insufficiency, and COPD who presents with tachycardia. She receives iron infusions, she went for her normal appointment earlier today and her pulse was elevated to the 160's, so she was advised to present to the ED. She endorses mild fatigue with exertion today but no other specific complaints. She recently underwent surgery for a right inguinal abscess with is healing well. Denies SOB, CP, fever, chills, abdominal pain or diarrhea. She does have history of PAF for which she takes coumadin.   Past Medical History  Diagnosis Date  . Coronary artery disease     Severe two vessel; s/p CABG  . Hypertension   . Diabetes mellitus     Type 2  . Hyperlipidemia   . Diabetic neuropathy   . PAF (paroxysmal atrial fibrillation)     failed medical therapy with amiodarone; not a candidate for class IC meds due to her CAD. No Multaq due to CHF hx; QT prolonged with amiodarone; Not able to be ablated due to valve disease  . CVA (cerebral vascular accident) 2004  . Hypothyroidism   . Chronic anemia   . Diastolic dysfunction     per echo in May of 2012  . GERD (gastroesophageal reflux disease)   . Arthritis   . Valvular heart disease     has moderate mitral insufficiency with severe tricuspid insufficiency per TEE in November 2012  . Memory loss, short term   . COPD (chronic obstructive pulmonary disease)   . Blood transfusion without reported diagnosis   . Heart attack     Past Surgical History  Procedure Date  . Coronary artery bypass graft 06/2009    X3, LIMA to LAD, SVG to diagonal, SVG to the posterior descending artery.  . Tonsillectomy   . Knee arthroscopy   . Transthoracic echocardiogram 11/12/2010   Ejection fraction felt to be around 50%.  Wall thickness was increased in a pattern of mild LVH.  Moderately dilated left atrium.  Mildly dilated right atrium. Right ventricle was mildly dilated with mildly reduced systolic function  . Cardiac catheterization 06/09/2009    inferior wall hypokinesia with ejection fraction of 55%.  . Abdominal hysterectomy   . Joint replacement   . Tee without cardioversion 05/23/2011    Procedure: TRANSESOPHAGEAL ECHOCARDIOGRAM (TEE);  Surgeon: Lewayne Bunting, MD;  Location: [facility] ENDOSCOPY;  Service: Cardiovascular;  Laterality: N/A;  . Incision and drainage abscess 05/07/2012    Procedure: INCISION AND DRAINAGE ABSCESS;  Surgeon: Ardeth Sportsman, MD;  Location: [facility] OR;  Service: General;  Laterality: N/A;  Groin wound    Family History  Problem Relation Age of Onset  . Colon cancer Mother   . Stroke Mother   . Hypertension Mother   . Cancer Mother     breast  . Cancer Father     colon  . Heart attack Father     x2    History  Substance Use Topics  . Smoking status: Former Games developer  . Smokeless tobacco: Former Neurosurgeon    Quit date: 07/04/1972  . Alcohol Use: No    OB History    Grav Para Term Preterm Abortions TAB SAB Ect Mult  Living                  Review of Systems  Constitutional: Positive for fatigue. Negative for fever, chills and appetite change.  Eyes: Negative for photophobia and visual disturbance.  Respiratory: Negative for cough, chest tightness and shortness of breath.   Cardiovascular: Positive for leg swelling. Negative for chest pain and palpitations.  Gastrointestinal: Negative for nausea, vomiting, abdominal pain and diarrhea.  Genitourinary: Negative for dysuria, decreased urine volume and difficulty urinating.  Musculoskeletal: Negative for myalgias and arthralgias.  Skin: Negative for color change and pallor.  Neurological: Negative for dizziness, speech difficulty and headaches.  Psychiatric/Behavioral: Negative for  confusion and agitation.    Allergies  Betadine; Capoten; Penicillins; and Propoxyphene and methadone  Home Medications   Current Outpatient Rx  Name  Route  Sig  Dispense  Refill  . ACETAMINOPHEN 500 MG PO TABS   Oral   Take 2 tablets (1,000 mg total) by mouth 3 (three) times daily.   30 tablet      . AMITRIPTYLINE HCL 25 MG PO TABS   Oral   Take 75 mg by mouth at bedtime.           . ASPIRIN 81 MG PO TABS   Oral   Take 81 mg by mouth daily.           . ATORVASTATIN CALCIUM 40 MG PO TABS   Oral   Take 40 mg by mouth every evening.          Marland Kitchen VITAMIN D 2000 UNITS PO TABS   Oral   Take 2,000 Units by mouth daily.           Marland Kitchen CLINDAMYCIN HCL 300 MG PO CAPS   Oral   Take 1 capsule (300 mg total) by mouth 4 (four) times daily.   28 capsule   0   . CLINDAMYCIN HCL 300 MG PO CAPS   Oral   Take 1 capsule (300 mg total) by mouth 3 (three) times daily.   18 capsule   0   . DONEPEZIL HCL 5 MG PO TABS   Oral   Take 5 mg by mouth at bedtime.         Marland Kitchen ESOMEPRAZOLE MAGNESIUM 40 MG PO CPDR   Oral   Take 40 mg by mouth daily before breakfast.           . OMEGA-3 FATTY ACIDS 1000 MG PO CAPS   Oral   Take 1 g by mouth daily.          . FUROSEMIDE 20 MG PO TABS   Oral   Take 20 mg by mouth 2 (two) times daily.         Marland Kitchen GLIMEPIRIDE 4 MG PO TABS   Oral   Take 0.5 tablets (2 mg total) by mouth 2 (two) times daily.         . INSULIN GLARGINE 100 UNIT/ML [place] SOLN   Subcutaneous   Inject 20 Units into the skin at bedtime.   10 mL      . INSULIN LISPRO (HUMAN) 100 UNIT/ML [place] SOLN      Subcutaneous, 3 times daily with meals Correction coverage: Moderate (average weight, post-op) CBG < 70: implement hypoglycemia protocol CBG 70 - 120: 0 units CBG 121 - 150: 2 units CBG 151 - 200: 3 units CBG 201 - 250: 5 units CBG 251 - 300: 8 units CBG 301 - 350: 11 units CBG  351 - 400: 15 units   10 mL      . IRON PO   Oral   Take 1 tablet by mouth  daily.          Marland Kitchen LEVOTHYROXINE SODIUM 100 MCG PO TABS   Oral   Take 100 mcg by mouth daily.         Marland Kitchen MAGNESIUM PO   Oral   Take 1 tablet by mouth daily.          Marland Kitchen METOPROLOL TARTRATE 50 MG PO TABS   Oral   Take 25 mg by mouth 2 (two) times daily.           BP 107/70  Pulse 166  Temp 97.9 F (36.6 C)  Resp 20  SpO2 95%  Physical Exam  Nursing note and vitals reviewed. Constitutional: She is oriented to person, place, and time. She appears well-developed and well-nourished. She is cooperative. She does not appear ill.  HENT:  Head: Normocephalic and atraumatic.  Mouth/Throat: Oropharynx is clear and moist.  Eyes: Conjunctivae normal and EOM are normal. Pupils are equal, round, and reactive to light.  Neck: Normal range of motion. Neck supple. JVD present.  Cardiovascular: Regular rhythm, normal heart sounds and intact distal pulses.  Tachycardia present.   Pulmonary/Chest: Effort normal and breath sounds normal.  Abdominal: Soft. Bowel sounds are normal.  Musculoskeletal: Normal range of motion. She exhibits edema (1+ LE edema).       Legs: Neurological: She is alert and oriented to person, place, and time. She has normal reflexes. No cranial nerve deficit.  Skin: Skin is warm and dry.   ED Course  Procedures (including critical care time)  Labs Reviewed  CBC WITH DIFFERENTIAL - Abnormal; Notable for the following:    RBC 3.39 (*)     Hemoglobin 10.3 (*)     HCT 30.7 (*)     All other components within normal limits  BASIC METABOLIC PANEL - Abnormal; Notable for the following:    Sodium 134 (*)     Glucose, Bld 227 (*)     BUN 33 (*)     Creatinine, Ser 1.56 (*)     GFR calc non Af Amer 31 (*)     GFR calc Af Amer 36 (*)     All other components within normal limits  GLUCOSE, CAPILLARY - Abnormal; Notable for the following:    Glucose-Capillary 182 (*)     All other components within normal limits  PROTIME-INR  URINALYSIS, ROUTINE W REFLEX  MICROSCOPIC  TROPONIN I  TROPONIN I  TROPONIN I  TSH  MAGNESIUM  PRO B NATRIURETIC PEPTIDE  HEMOGLOBIN A1C  LIPID PANEL  PROTIME-INR   Dg Chest Portable 1 View  05/25/2012  *RADIOLOGY REPORT*  Clinical Data: Tachycardia  PORTABLE CHEST - 1 VIEW  Comparison: 05/04/2012  Findings: Previous median sternotomy.  Stable cardiomegaly.  Low lung volumes with crowding of perihilar and bibasilar bronchovascular structures.  No confluent airspace infiltrate.  No effusion.  IMPRESSION:  Stable cardiomegaly   Original Report Authenticated By: D. Andria Rhein, MD    MDM  Ms. Flynt is a 78 yo AAF with pertinent history of CAD s/p CABG, DM, PAF, hypothyroidism, mitral insufficiency, and COPD who presents with tachycardia. Afebrile, HR 160's, normotensive, adequate oxygenation. Rhythm is regular. Concern for SVT vs atrial fibrillation. She was tolerating HR well so no indication for cardioversion. Evaluated with basic labs which revealed Cr 1.5 (at baseline), normal lytes, Hgb  10.3 (chronic iron deficiency anemia), WBC 8.2. INR 1.1 (her goal is 2-3). CXR showed stable cardiomegaly, no other abnormality. Unknown cause of tachycardia but unlikely due to infection. Treated with 20 mg of cardizem with rapid lowering of HR to 80's. Repeat EKG revealed 2:1 conduction aflutter. Blood pressure remained acceptable. Started on Cardizem gtt and admitted to Cardiology for further management. Discussed w/u with patient and need for admission, she is in agreement with plan. She remained HD stable while in the ED.   Reviewed imaging, labs, EKG and previous medical records, utilized in MDM  Discussed case with Dr. Anitra Lauth  Clinical Impression 1. Atrial flutter with RVR 2. CKD 3. Iron deficiency anemia. 4. Subtherapeutic INR  EKG: Aflutter, rate 83, RBB, diffuse T wave inversions, no change from previous EKG.      Margie Billet, MD 05/26/12 4034  Margie Billet, MD 05/26/12 [phone]

## 2012-05-25 NOTE — ED Notes (Signed)
Pt states she was told to come here after iron infusion for rapid heart rate.

## 2012-05-25 NOTE — Progress Notes (Addendum)
ANTICOAGULATION CONSULT NOTE - Initial Consult  Pharmacy Consult for Coumadin Indication: Aflutter with RVR  Allergies  Allergen Reactions  . Betadine (Povidone Iodine) Itching  . Capoten (Captopril) Other (See Comments)    unknown  . Penicillins Swelling  . Propoxyphene And Methadone     Intolerance to darvocet    Patient Measurements: Height: 5\' 3"  (160 cm) Weight: 212 lb 4.9 oz (96.3 kg) IBW/kg (Calculated) : 52.4   Vital Signs: Temp: 97.9 F (36.6 C) (11/22 1828) BP: 118/55 mmHg (11/22 2200) Pulse Rate: 84  (11/22 2200)  Labs:  Basename 05/25/12 1909 05/25/12 0944  HGB 10.3* 10.5*  HCT 30.7* 32.0*  PLT 265 278  APTT -- --  LABPROT 14.2 --  INR 1.11 --  HEPARINUNFRC -- --  CREATININE 1.56* --  CKTOTAL -- --  CKMB -- --  TROPONINI -- --    Estimated Creatinine Clearance: 33.4 ml/min (by C-G formula based on Cr of 1.56).   Medical History: Past Medical History  Diagnosis Date  . Coronary artery disease     Severe two vessel; s/p CABG  . Hypertension   . Diabetes mellitus     Type 2  . Hyperlipidemia   . Diabetic neuropathy   . PAF (paroxysmal atrial fibrillation)     failed medical therapy with amiodarone; not a candidate for class IC meds due to her CAD. No Multaq due to CHF hx; QT prolonged with amiodarone; Not able to be ablated due to valve disease  . CVA (cerebral vascular accident) 2004  . Hypothyroidism   . Chronic anemia   . Diastolic dysfunction     per echo in May of 2012  . GERD (gastroesophageal reflux disease)   . Arthritis   . Valvular heart disease     has moderate mitral insufficiency with severe tricuspid insufficiency per TEE in November 2012  . Memory loss, short term   . COPD (chronic obstructive pulmonary disease)   . Blood transfusion without reported diagnosis   . Heart attack     Assessment: 78yo female was to receive iron infusion but didn't due to rapid HR, had not taken metoprolol, now in Aflutter with RVR, to begin  anticoagulation.  Was previously on Coumadin but D/C'd in April 2013 due to lower GIB and no further Afib noted until now.  Last known Coumadin regimen was 5mg  daily except 10mg  on Mon/Fri.  Goal of Therapy:  INR 2-3   Plan:  Will give Coumadin 7.5mg  po x1 now and monitor INR for dose adjustments; will also watch CBC and begin Coumadin education.  Colleen Can PharmD BCPS 05/25/2012,11:48 PM

## 2012-05-25 NOTE — Progress Notes (Signed)
Pt. In today for iron infusion.  Pt brought back and vital signs taken.  HR was 172, RN verified with stethoscope, HR was very rapid and regular.  Pt denied any adverse s/e but did report she did not take her scheduled meds this morning.  MD out of office today, notified PA Larina Bras per infusion charge nurse Amy Horton, RN and order given to hold scheduled treatment today.  Pt instructed to go home ASAP and take her regular medications and to monitor HR and call her family MD for any further complications.  Pt daughter called and coming to pick up pt.  Pt also instructed to call [facility] to reschedule infusion appt.  Pt verbalized understanding of all above instructions and to call 911 for any emergencies.    Ronalee Belts RN

## 2012-05-25 NOTE — Telephone Encounter (Signed)
New problem;   C/o heart rate 160. Went in for iron infusion today . Please advise .

## 2012-05-26 LAB — URINALYSIS, ROUTINE W REFLEX MICROSCOPIC
Hgb urine dipstick: NEGATIVE
Protein, ur: NEGATIVE mg/dL
Urobilinogen, UA: 0.2 mg/dL (ref 0.0–1.0)

## 2012-05-26 LAB — URINE MICROSCOPIC-ADD ON

## 2012-05-26 LAB — GLUCOSE, CAPILLARY
Glucose-Capillary: 182 mg/dL — ABNORMAL HIGH (ref 70–99)
Glucose-Capillary: 289 mg/dL — ABNORMAL HIGH (ref 70–99)

## 2012-05-26 LAB — PRO B NATRIURETIC PEPTIDE: Pro B Natriuretic peptide (BNP): 2740 pg/mL — ABNORMAL HIGH (ref 0–450)

## 2012-05-26 LAB — LIPID PANEL
Cholesterol: 160 mg/dL (ref 0–200)
Triglycerides: 80 mg/dL (ref <150)

## 2012-05-26 LAB — CBC
Hemoglobin: 9.8 g/dL — ABNORMAL LOW (ref 12.0–15.0)
MCH: 29.8 pg (ref 26.0–34.0)
MCHC: 32.9 g/dL (ref 30.0–36.0)
MCV: 90.6 fL (ref 78.0–100.0)
Platelets: 235 10*3/uL (ref 150–400)

## 2012-05-26 LAB — PROTIME-INR: INR: 1.23 (ref 0.00–1.49)

## 2012-05-26 LAB — HEMOGLOBIN A1C: Hgb A1c MFr Bld: 10.6 % — ABNORMAL HIGH (ref <5.7)

## 2012-05-26 MED ORDER — OFF THE BEAT BOOK
Freq: Once | Status: AC
  Administered 2012-05-27: 14:00:00
  Filled 2012-05-26: qty 1

## 2012-05-26 MED ORDER — WARFARIN VIDEO: Freq: Once | Status: DC

## 2012-05-26 MED ORDER — COUMADIN BOOK
Freq: Once | Status: AC
  Administered 2012-05-27: 14:00:00
  Filled 2012-05-26: qty 1

## 2012-05-26 MED ORDER — HEPARIN (PORCINE) IN NACL 100-0.45 UNIT/ML-% IJ SOLN
1100.0000 [IU]/h | INTRAMUSCULAR | Status: DC
  Administered 2012-05-26: 1100 [IU]/h via INTRAVENOUS
  Filled 2012-05-26 (×2): qty 250

## 2012-05-26 MED ORDER — DILTIAZEM HCL 100 MG IV SOLR
5.0000 mg/h | INTRAVENOUS | Status: DC
  Administered 2012-05-26: 5 mg/h via INTRAVENOUS
  Filled 2012-05-26: qty 100

## 2012-05-26 MED ORDER — HEPARIN (PORCINE) IN NACL 100-0.45 UNIT/ML-% IJ SOLN
1150.0000 [IU]/h | INTRAMUSCULAR | Status: DC
  Administered 2012-05-27: 1150 [IU]/h via INTRAVENOUS
  Filled 2012-05-26 (×2): qty 250

## 2012-05-26 MED ORDER — DILTIAZEM HCL ER COATED BEADS 120 MG PO CP24
120.0000 mg | ORAL_CAPSULE | Freq: Every day | ORAL | Status: DC
Start: 2012-05-26 — End: 2012-05-27
  Administered 2012-05-26: 120 mg via ORAL
  Filled 2012-05-26 (×2): qty 1

## 2012-05-26 MED ORDER — HEPARIN BOLUS VIA INFUSION
4000.0000 [IU] | Freq: Once | INTRAVENOUS | Status: AC
  Administered 2012-05-26: 4000 [IU] via INTRAVENOUS
  Filled 2012-05-26: qty 4000

## 2012-05-26 NOTE — Progress Notes (Signed)
ANTICOAGULATION CONSULT NOTE - Initial Consult  Pharmacy Consult for heparin Indication: chest pain/ACS and atrial fibrillation  Allergies  Allergen Reactions  . Betadine (Povidone Iodine) Itching  . Capoten (Captopril) Other (See Comments)    unknown  . Penicillins Swelling  . Propoxyphene And Methadone     Intolerance to darvocet    Patient Measurements: Height: 5\' 3"  (160 cm) Weight: 212 lb 1.3 oz (96.2 kg) IBW/kg (Calculated) : 52.4  Heparin Dosing Weight: 78kg  Vital Signs: Temp: 97.4 F (36.3 C) (11/23 0058) Temp src: Oral (11/23 0058) BP: 111/59 mmHg (11/23 0058) Pulse Rate: 83  (11/23 0058)  Labs:  Basename 05/26/12 0147 05/25/12 1909 05/25/12 0944  HGB -- 10.3* 10.5*  HCT -- 30.7* 32.0*  PLT -- 265 278  APTT -- -- --  LABPROT -- 14.2 --  INR -- 1.11 --  HEPARINUNFRC -- -- --  CREATININE -- 1.56* --  CKTOTAL -- -- --  CKMB -- -- --  TROPONINI 0.79* -- --    Estimated Creatinine Clearance: 33.3 ml/min (by C-G formula based on Cr of 1.56).   Medical History: Past Medical History  Diagnosis Date  . Coronary artery disease     Severe two vessel; s/p CABG  . Hypertension   . Diabetes mellitus     Type 2  . Hyperlipidemia   . Diabetic neuropathy   . PAF (paroxysmal atrial fibrillation)     failed medical therapy with amiodarone; not a candidate for class IC meds due to her CAD. No Multaq due to CHF hx; QT prolonged with amiodarone; Not able to be ablated due to valve disease  . CVA (cerebral vascular accident) 2004  . Hypothyroidism   . Chronic anemia   . Diastolic dysfunction     per echo in May of 2012  . GERD (gastroesophageal reflux disease)   . Arthritis   . Valvular heart disease     has moderate mitral insufficiency with severe tricuspid insufficiency per TEE in November 2012  . Memory loss, short term   . COPD (chronic obstructive pulmonary disease)   . Blood transfusion without reported diagnosis   . Heart attack     Medications:    Prescriptions prior to admission  Medication Sig Dispense Refill  . amitriptyline (ELAVIL) 25 MG tablet Take 75 mg by mouth at bedtime.        Marland Kitchen aspirin EC 81 MG tablet Take 81 mg by mouth daily.      Marland Kitchen atorvastatin (LIPITOR) 40 MG tablet Take 40 mg by mouth every evening.       . donepezil (ARICEPT) 5 MG tablet Take 5 mg by mouth at bedtime.      Marland Kitchen esomeprazole (NEXIUM) 40 MG capsule Take 40 mg by mouth daily before breakfast.        . glimepiride (AMARYL) 4 MG tablet Take 0.5 tablets (2 mg total) by mouth 2 (two) times daily.      . insulin glargine (LANTUS) 100 UNIT/ML injection Inject 35 Units into the skin 2 (two) times daily.      Marland Kitchen levothyroxine (SYNTHROID, LEVOTHROID) 100 MCG tablet Take 100 mcg by mouth daily.      . metoprolol (LOPRESSOR) 50 MG tablet Take 25 mg by mouth 2 (two) times daily.       Scheduled:    . amitriptyline  75 mg Oral QHS  . aspirin EC  81 mg Oral Daily  . atorvastatin  40 mg Oral QPM  . coumadin book  Does not apply Once  . [COMPLETED] diltiazem (CARDIZEM) infusion  5-15 mg/hr Intravenous Once  . [COMPLETED] diltiazem  20 mg Intravenous Once  . donepezil  5 mg Oral QHS  . glimepiride  2 mg Oral BID WC  . insulin glargine  35 Units Subcutaneous BID  . levothyroxine  100 mcg Oral QAC breakfast  . metoprolol  75 mg Oral BID  . pantoprazole  40 mg Oral Daily  . warfarin  7.5 mg Oral NOW  . warfarin   Does not apply Once  . [DISCONTINUED] aspirin EC  81 mg Oral Daily  . [DISCONTINUED] enoxaparin (LOVENOX) injection  95 mg Subcutaneous NOW  . [DISCONTINUED] Warfarin - Pharmacist Dosing Inpatient   Does not apply q1800    Assessment: 78yo female admitted with Aflutter with RVR, had been on Coumadin in past but had stopped s/p GIB, now with elevated troponin, to begin heparin.  Goal of Therapy:  Heparin level 0.3-0.7 units/ml Monitor platelets by anticoagulation protocol: Yes   Plan:  Will give heparin 4000 units IV bolus x1 followed by gtt at 1100  units/hr and monitor heparin levels and CBC.  Colleen Can PharmD BCPS 05/26/2012,3:30 AM

## 2012-05-26 NOTE — Progress Notes (Addendum)
SUBJECTIVE:  No complaints today.  She occasionally can feel her heart flutter but is fairly asymptomatic at this time.  She denies any SOB.  OBJECTIVE:   Vitals:   Filed Vitals:   05/25/12 2330 05/26/12 0058 05/26/12 0653 05/26/12 1032  BP: 107/48 111/59 95/54 105/60  Pulse: 82 83 77 76  Temp:  97.4 F (36.3 C) 97.3 F (36.3 C)   TempSrc:  Oral Oral   Resp: 16 18 18    Height:      Weight:  96.2 kg (212 lb 1.3 oz)    SpO2: 100% 100% 98%    I&O's:  No intake or output data in the 24 hours ending 05/26/12 1148 TELEMETRY: Reviewed telemetry pt in atrial flutter with CVR     PHYSICAL EXAM General: Well developed, well nourished, in no acute distress Head: Eyes PERRLA, No xanthomas.   Normal cephalic and atramatic  Lungs:   Clear bilaterally to auscultation and percussion. Heart:   HRRR S1 S2 Pulses are 2+ & equal. Abdomen: Bowel sounds are positive, abdomen soft and non-tender without masses  Extremities:   No clubbing, cyanosis or edema.  DP +1 Neuro: Alert and oriented X 3. Psych:  Good affect, responds appropriately   LABS: Basic Metabolic Panel:  Basename 05/26/12 0147 05/25/12 1909  NA -- 134*  K -- 3.9  CL -- 96  CO2 -- 27  GLUCOSE -- 227*  BUN -- 33*  CREATININE -- 1.56*  CALCIUM -- 9.2  MG 2.0 --  PHOS -- --   Liver Function Tests: No results found for this basename: AST:2,ALT:2,ALKPHOS:2,BILITOT:2,PROT:2,ALBUMIN:2 in the last 72 hours No results found for this basename: LIPASE:2,AMYLASE:2 in the last 72 hours CBC:  Basename 05/26/12 0615 05/25/12 1909  WBC 8.6 8.2  NEUTROABS -- 4.9  HGB 9.8* 10.3*  HCT 29.8* 30.7*  MCV 90.6 90.6  PLT 235 265   Cardiac Enzymes:  Basename 05/26/12 0615 05/26/12 0147  CKTOTAL -- --  CKMB -- --  CKMBINDEX -- --  TROPONINI 0.75* 0.79*   Fasting Lipid Panel:  Basename 05/26/12 0615  CHOL 160  HDL 29*  LDLCALC 115*  TRIG 80  CHOLHDL 5.5  LDLDIRECT --   Thyroid Function Tests: No results found for this  basename: TSH,T4TOTAL,FREET3,T3FREE,THYROIDAB in the last 72 hours Anemia Panel: No results found for this basename: VITAMINB12,FOLATE,FERRITIN,TIBC,IRON,RETICCTPCT in the last 72 hours Coag Panel:   Lab Results  Component Value Date   INR 1.23 05/26/2012   INR 1.11 05/25/2012   INR 1.15 05/02/2012    RADIOLOGY: Ct Pelvis W Contrast  05/03/2012  *RADIOLOGY REPORT*  Clinical Data:  Right groin cellulitis. Tenderness to touch. Warmth.  CT PELVIS WITH CONTRAST  Technique:  Multidetector CT imaging of the pelvis was performed using the standard protocol following the bolus administration of intravenous contrast.  Contrast: 80mL OMNIPAQUE IOHEXOL 300 MG/ML  SOLN  Comparison:   None.  Findings:  There is soft tissue stranding in the subcutaneous fat of the anterior aspect of the proximal right thigh.  There is no abscess.  No significant adenopathy in the right inguinal region. The area of maximum involvement is approximately 6 cm in diameter.  The underlying muscle structures and bones are normal.  Note is made of numerous diverticula in the colon.  Uterus and ovaries been removed.  Incidental note is made of multiple tiny stones in the gallbladder.  Minimal arthritic changes at both hips.  IMPRESSION: Focal slight cellulitis of the subcutaneous fat of the anterior aspect  of the proximal right thigh.  No abscess or adenopathy.  Air is seen in the soft tissues of the anterior aspect of both thighs, probably related to subcutaneous injections.   Original Report Authenticated By: Francene Boyers, M.D.    Dg Chest Portable 1 View  05/25/2012  *RADIOLOGY REPORT*  Clinical Data: Tachycardia  PORTABLE CHEST - 1 VIEW  Comparison: 05/04/2012  Findings: Previous median sternotomy.  Stable cardiomegaly.  Low lung volumes with crowding of perihilar and bibasilar bronchovascular structures.  No confluent airspace infiltrate.  No effusion.  IMPRESSION:  Stable cardiomegaly   Original Report Authenticated By: D. Andria Rhein, MD    Dg Chest Port 1 View  05/04/2012  *RADIOLOGY REPORT*  Clinical Data: 78 year old female fever and cough.  PORTABLE CHEST - 1 VIEW  Comparison: 04/06/2011 and earlier.  Findings: Portable upright AP view 1529 hours. Stable cardiomegaly and mediastinal contours.  No pulmonary edema.  No pleural effusion.  No consolidation or confluent pulmonary opacity.  No pneumothorax.  IMPRESSION: Stable cardiomegaly.  No acute cardiopulmonary abnormality.   Original Report Authenticated By: Erskine Speed, M.D.       ASSESSMENT:  1. Atrial Flutter with rapid ventricular response rate - now rate controlled.  Not really any options left for maintaining NSR.  She failed medical thearpy on amiodarone due to prolonged QTc and is not a candidate for class IC agents due to underlying CAD.  She cannot be on Multaq due to history of CHF and has been deemed not to be a candidate for Tikosyn due to QT prolongation by Dr. Johney Frame.  She is not a candidate for ablation due to severe TR.  Currently she is fairly asymptomatic with rate control. 2. Right heart failure  3. CAD s/p CABG  4. Chronic kidney disease (stage 3)  5. IDDM  6. Hypokalemia - repleted 7.  Elevated troponin most likely secondary to demand ischemia in the setting of rapid atrial flutter 8.  Systemic anticoagulation  - currently on IV Heparin    PLAN:   1.  D/C IV Cardizem gtt and change to PO Cardizem CD 120mg  daily 2.  Continue Metoprolol 3. ? Whether she is a longterm coumadin candidate - history is confusing - patient says that she has been on coumadin but it was not on her home med list and her INR was normal on admission.  She was in the hospital late October for a cellulitis and ? abcesss and had wound care but discharge summary does not state that patient was to continue coumadin.  Will keep on IV Heparin over weekend until this can be sorted out by primary Cardiologist   Quintella Reichert, MD  05/26/2012  11:48 AM

## 2012-05-26 NOTE — Progress Notes (Addendum)
ANTICOAGULATION CONSULT NOTE - Initial Consult  Pharmacy Consult for heparin Indication: chest pain/ACS and atrial fibrillation  Allergies  Allergen Reactions  . Betadine (Povidone Iodine) Itching  . Capoten (Captopril) Other (See Comments)    unknown  . Penicillins Swelling  . Propoxyphene And Methadone     Intolerance to darvocet    Patient Measurements: Height: 5\' 3"  (160 cm) Weight: 212 lb 1.3 oz (96.2 kg) IBW/kg (Calculated) : 52.4  Heparin Dosing Weight: 78kg  Vital Signs: Temp: 97.1 F (36.2 C) (11/23 1408) Temp src: Oral (11/23 1408) BP: 105/67 mmHg (11/23 1424) Pulse Rate: 75  (11/23 1408)  Labs:  Basename 05/26/12 1541 05/26/12 1007 05/26/12 1003 05/26/12 0618 05/26/12 0615 05/26/12 0147 05/25/12 1909 05/25/12 0944  HGB -- -- -- -- 9.8* -- 10.3* --  HCT -- -- -- -- 29.8* -- 30.7* 32.0*  PLT -- -- -- -- 235 -- 265 278  APTT -- -- -- -- -- -- -- --  LABPROT -- -- -- 15.3* -- -- 14.2 --  INR -- -- -- 1.23 -- -- 1.11 --  HEPARINUNFRC 0.34 0.40 -- -- -- -- -- --  CREATININE -- -- -- -- -- -- 1.56* --  CKTOTAL -- -- -- -- -- -- -- --  CKMB -- -- -- -- -- -- -- --  TROPONINI -- -- 0.64* -- 0.75* 0.79* -- --    Estimated Creatinine Clearance: 33.3 ml/min (by C-G formula based on Cr of 1.56).   Medical History: Past Medical History  Diagnosis Date  . Coronary artery disease     Severe two vessel; s/p CABG  . Hypertension   . Diabetes mellitus     Type 2  . Hyperlipidemia   . Diabetic neuropathy   . PAF (paroxysmal atrial fibrillation)     failed medical therapy with amiodarone; not a candidate for class IC meds due to her CAD. No Multaq due to CHF hx; QT prolonged with amiodarone; Not able to be ablated due to valve disease  . CVA (cerebral vascular accident) 2004  . Hypothyroidism   . Chronic anemia   . Diastolic dysfunction     per echo in May of 2012  . GERD (gastroesophageal reflux disease)   . Arthritis   . Valvular heart disease     has  moderate mitral insufficiency with severe tricuspid insufficiency per TEE in November 2012  . Memory loss, short term   . COPD (chronic obstructive pulmonary disease)   . Blood transfusion without reported diagnosis   . Heart attack     Medications:  Prescriptions prior to admission  Medication Sig Dispense Refill  . amitriptyline (ELAVIL) 25 MG tablet Take 75 mg by mouth at bedtime.        Marland Kitchen aspirin EC 81 MG tablet Take 81 mg by mouth daily.      Marland Kitchen atorvastatin (LIPITOR) 40 MG tablet Take 40 mg by mouth every evening.       . donepezil (ARICEPT) 5 MG tablet Take 5 mg by mouth at bedtime.      Marland Kitchen esomeprazole (NEXIUM) 40 MG capsule Take 40 mg by mouth daily before breakfast.        . glimepiride (AMARYL) 4 MG tablet Take 0.5 tablets (2 mg total) by mouth 2 (two) times daily.      . insulin glargine (LANTUS) 100 UNIT/ML injection Inject 35 Units into the skin 2 (two) times daily.      Marland Kitchen levothyroxine (SYNTHROID, LEVOTHROID) 100 MCG tablet Take  100 mcg by mouth daily.      . metoprolol (LOPRESSOR) 50 MG tablet Take 25 mg by mouth 2 (two) times daily.       Scheduled:     . amitriptyline  75 mg Oral QHS  . aspirin EC  81 mg Oral Daily  . atorvastatin  40 mg Oral QPM  . coumadin book   Does not apply Once  . diltiazem  120 mg Oral Daily  . [COMPLETED] diltiazem (CARDIZEM) infusion  5-15 mg/hr Intravenous Once  . [COMPLETED] diltiazem  20 mg Intravenous Once  . donepezil  5 mg Oral QHS  . glimepiride  2 mg Oral BID WC  . [COMPLETED] heparin  4,000 Units Intravenous Once  . insulin glargine  35 Units Subcutaneous BID  . levothyroxine  100 mcg Oral QAC breakfast  . metoprolol  75 mg Oral BID  . off the beat book   Does not apply Once  . pantoprazole  40 mg Oral Daily  . [DISCONTINUED] aspirin EC  81 mg Oral Daily  . [DISCONTINUED] enoxaparin (LOVENOX) injection  95 mg Subcutaneous NOW  . [DISCONTINUED] warfarin  7.5 mg Oral NOW  . [DISCONTINUED] warfarin   Does not apply Once  .  [DISCONTINUED] Warfarin - Pharmacist Dosing Inpatient   Does not apply q1800    Assessment: 78yo female admitted with Aflutter with RVR, had been on Coumadin in past but had stopped s/p GIB, although history is unclear.  Started on IV heparin.  Heparin level at goal on 1100 units/hr, repeat level 0.34, trending down slightly.  No bleeding or complications noted per chart note.  Goal of Therapy:  Heparin level 0.3-0.7 units/ml Monitor platelets by anticoagulation protocol: Yes   Plan:  1. Increase heparin a little to 1150 units/hr to try to keep in therapeutic range.. 2. Daily heparin level and CBC. 3. F/U plans for Coumadin with primary cards MD on Monday.  Gardner Candle PharmD BCPS 05/26/2012,5:08 PM

## 2012-05-26 NOTE — Progress Notes (Signed)
ANTICOAGULATION CONSULT NOTE - Initial Consult  Pharmacy Consult for heparin Indication: chest pain/ACS and atrial fibrillation  Allergies  Allergen Reactions  . Betadine (Povidone Iodine) Itching  . Capoten (Captopril) Other (See Comments)    unknown  . Penicillins Swelling  . Propoxyphene And Methadone     Intolerance to darvocet    Patient Measurements: Height: 5\' 3"  (160 cm) Weight: 212 lb 1.3 oz (96.2 kg) IBW/kg (Calculated) : 52.4  Heparin Dosing Weight: 78kg  Vital Signs: Temp: 97.3 F (36.3 C) (11/23 0653) Temp src: Oral (11/23 0653) BP: 105/60 mmHg (11/23 1032) Pulse Rate: 76  (11/23 1032)  Labs:  Basename 05/26/12 1007 05/26/12 1003 05/26/12 0618 05/26/12 0615 05/26/12 0147 05/25/12 1909 05/25/12 0944  HGB -- -- -- 9.8* -- 10.3* --  HCT -- -- -- 29.8* -- 30.7* 32.0*  PLT -- -- -- 235 -- 265 278  APTT -- -- -- -- -- -- --  LABPROT -- -- 15.3* -- -- 14.2 --  INR -- -- 1.23 -- -- 1.11 --  HEPARINUNFRC 0.40 -- -- -- -- -- --  CREATININE -- -- -- -- -- 1.56* --  CKTOTAL -- -- -- -- -- -- --  CKMB -- -- -- -- -- -- --  TROPONINI -- 0.64* -- 0.75* 0.79* -- --    Estimated Creatinine Clearance: 33.3 ml/min (by C-G formula based on Cr of 1.56).   Medical History: Past Medical History  Diagnosis Date  . Coronary artery disease     Severe two vessel; s/p CABG  . Hypertension   . Diabetes mellitus     Type 2  . Hyperlipidemia   . Diabetic neuropathy   . PAF (paroxysmal atrial fibrillation)     failed medical therapy with amiodarone; not a candidate for class IC meds due to her CAD. No Multaq due to CHF hx; QT prolonged with amiodarone; Not able to be ablated due to valve disease  . CVA (cerebral vascular accident) 2004  . Hypothyroidism   . Chronic anemia   . Diastolic dysfunction     per echo in May of 2012  . GERD (gastroesophageal reflux disease)   . Arthritis   . Valvular heart disease     has moderate mitral insufficiency with severe tricuspid  insufficiency per TEE in November 2012  . Memory loss, short term   . COPD (chronic obstructive pulmonary disease)   . Blood transfusion without reported diagnosis   . Heart attack     Medications:  Prescriptions prior to admission  Medication Sig Dispense Refill  . amitriptyline (ELAVIL) 25 MG tablet Take 75 mg by mouth at bedtime.        Marland Kitchen aspirin EC 81 MG tablet Take 81 mg by mouth daily.      Marland Kitchen atorvastatin (LIPITOR) 40 MG tablet Take 40 mg by mouth every evening.       . donepezil (ARICEPT) 5 MG tablet Take 5 mg by mouth at bedtime.      Marland Kitchen esomeprazole (NEXIUM) 40 MG capsule Take 40 mg by mouth daily before breakfast.        . glimepiride (AMARYL) 4 MG tablet Take 0.5 tablets (2 mg total) by mouth 2 (two) times daily.      . insulin glargine (LANTUS) 100 UNIT/ML injection Inject 35 Units into the skin 2 (two) times daily.      Marland Kitchen levothyroxine (SYNTHROID, LEVOTHROID) 100 MCG tablet Take 100 mcg by mouth daily.      . metoprolol (LOPRESSOR)  50 MG tablet Take 25 mg by mouth 2 (two) times daily.       Scheduled:     . amitriptyline  75 mg Oral QHS  . aspirin EC  81 mg Oral Daily  . atorvastatin  40 mg Oral QPM  . coumadin book   Does not apply Once  . diltiazem  120 mg Oral Daily  . [COMPLETED] diltiazem (CARDIZEM) infusion  5-15 mg/hr Intravenous Once  . [COMPLETED] diltiazem  20 mg Intravenous Once  . donepezil  5 mg Oral QHS  . glimepiride  2 mg Oral BID WC  . [COMPLETED] heparin  4,000 Units Intravenous Once  . insulin glargine  35 Units Subcutaneous BID  . levothyroxine  100 mcg Oral QAC breakfast  . metoprolol  75 mg Oral BID  . off the beat book   Does not apply Once  . pantoprazole  40 mg Oral Daily  . [DISCONTINUED] aspirin EC  81 mg Oral Daily  . [DISCONTINUED] enoxaparin (LOVENOX) injection  95 mg Subcutaneous NOW  . [DISCONTINUED] warfarin  7.5 mg Oral NOW  . [DISCONTINUED] warfarin   Does not apply Once  . [DISCONTINUED] Warfarin - Pharmacist Dosing Inpatient    Does not apply q1800    Assessment: 78yo female admitted with Aflutter with RVR, had been on Coumadin in past but had stopped s/p GIB, although history is unclear.  Started on IV heparin.  Heparin level at goal on 1100 units/hr.  No bleeding or complications noted per chart note.  Goal of Therapy:  Heparin level 0.3-0.7 units/ml Monitor platelets by anticoagulation protocol: Yes   Plan:  1. Continue IV heparin at current rate. 2. F/U confirmatory heparin level at 1600 today. 3. Daily heparin level and CBC. 4. F/U plans for Coumadin with primary cards MD on Monday.  CarneyGwenlyn Found PharmD BCPS 05/26/2012,1:15 PM

## 2012-05-26 NOTE — ED Provider Notes (Signed)
I saw and evaluated the patient, reviewed the resident's note and I agree with the findings and plan. I have reviewed EKG and agree with the resident interpretation.   Patient presented with A. fib RVR. She denied any symptoms spell that was between 116 170. She has a history of paroxysmal A. fib but is not on chronic therapy. She does take Lopressor to 50 mg twice a day. Patient was given 10 mg of Cardizem which rate controlled her and then she maintained rate control on 5 mg drip. She was admitted to cardiology   CRITICAL CARE Performed by: Gwyneth Sprout   Total critical care time: 30  Critical care time was exclusive of separately billable procedures and treating other patients.  Critical care was necessary to treat or prevent imminent or life-threatening deterioration.  Critical care was time spent personally by me on the following activities: development of treatment plan with patient and/or surrogate as well as nursing, discussions with consultants, evaluation of patient's response to treatment, examination of patient, obtaining history from patient or surrogate, ordering and performing treatments and interventions, ordering and review of laboratory studies, ordering and review of radiographic studies, pulse oximetry and re-evaluation of patient's condition.   Gwyneth Sprout, MD 05/26/12 [id number]

## 2012-05-26 NOTE — Progress Notes (Signed)
UR COMPLETED.   Marcelina Mclaurin Wise Dahir Ayer, RN, BSN    Phone #[phone] 

## 2012-05-27 DIAGNOSIS — I2510 Atherosclerotic heart disease of native coronary artery without angina pectoris: Secondary | ICD-10-CM

## 2012-05-27 DIAGNOSIS — I519 Heart disease, unspecified: Secondary | ICD-10-CM

## 2012-05-27 DIAGNOSIS — I4892 Unspecified atrial flutter: Principal | ICD-10-CM

## 2012-05-27 LAB — GLUCOSE, CAPILLARY
Glucose-Capillary: 187 mg/dL — ABNORMAL HIGH (ref 70–99)
Glucose-Capillary: 297 mg/dL — ABNORMAL HIGH (ref 70–99)

## 2012-05-27 LAB — PROTIME-INR
INR: 1.18 (ref 0.00–1.49)
Prothrombin Time: 14.8 seconds (ref 11.6–15.2)

## 2012-05-27 LAB — HEPARIN LEVEL (UNFRACTIONATED)
Heparin Unfractionated: 0.3 IU/mL (ref 0.30–0.70)
Heparin Unfractionated: 0.39 IU/mL (ref 0.30–0.70)

## 2012-05-27 LAB — CBC
Platelets: 226 10*3/uL (ref 150–400)
RBC: 3.16 MIL/uL — ABNORMAL LOW (ref 3.87–5.11)
WBC: 6.9 10*3/uL (ref 4.0–10.5)

## 2012-05-27 MED ORDER — FUROSEMIDE 10 MG/ML IJ SOLN
40.0000 mg | Freq: Two times a day (BID) | INTRAMUSCULAR | Status: DC
  Administered 2012-05-27 – 2012-05-28 (×4): 40 mg via INTRAVENOUS
  Filled 2012-05-27 (×7): qty 4

## 2012-05-27 MED ORDER — WARFARIN SODIUM 5 MG PO TABS
5.0000 mg | ORAL_TABLET | Freq: Once | ORAL | Status: AC
  Administered 2012-05-27: 5 mg via ORAL
  Filled 2012-05-27: qty 1

## 2012-05-27 MED ORDER — WARFARIN VIDEO
Freq: Once | Status: AC
  Administered 2012-05-27: 14:00:00

## 2012-05-27 MED ORDER — HEPARIN (PORCINE) IN NACL 100-0.45 UNIT/ML-% IJ SOLN
1250.0000 [IU]/h | INTRAMUSCULAR | Status: DC
  Administered 2012-05-27: 1250 [IU]/h via INTRAVENOUS
  Filled 2012-05-27 (×5): qty 250

## 2012-05-27 MED ORDER — DILTIAZEM HCL 25 MG/5ML IV SOLN
10.0000 mg | Freq: Once | INTRAVENOUS | Status: AC
  Administered 2012-05-27: 10 mg via INTRAVENOUS
  Filled 2012-05-27: qty 5

## 2012-05-27 MED ORDER — METOPROLOL SUCCINATE ER 50 MG PO TB24
50.0000 mg | ORAL_TABLET | Freq: Two times a day (BID) | ORAL | Status: DC
  Administered 2012-05-27 – 2012-05-30 (×7): 50 mg via ORAL
  Filled 2012-05-27 (×8): qty 1

## 2012-05-27 MED ORDER — WARFARIN - PHARMACIST DOSING INPATIENT
Freq: Every day | Status: DC
  Administered 2012-05-27: 18:00:00

## 2012-05-27 MED ORDER — DILTIAZEM HCL 100 MG IV SOLR
5.0000 mg/h | INTRAVENOUS | Status: DC
  Administered 2012-05-27: 10 mg/h via INTRAVENOUS
  Administered 2012-05-27: 5 mg/h via INTRAVENOUS
  Filled 2012-05-27 (×2): qty 100

## 2012-05-27 MED ORDER — PATIENT'S GUIDE TO USING COUMADIN BOOK
Freq: Once | Status: AC
  Administered 2012-05-27: 14:00:00
  Filled 2012-05-27: qty 1

## 2012-05-27 NOTE — Progress Notes (Signed)
ANTICOAGULATION CONSULT NOTE - Initial Consult  Pharmacy Consult for heparin/start Coumadin Indication: chest pain/ACS and atrial fibrillation  Allergies  Allergen Reactions  . Betadine (Povidone Iodine) Itching  . Capoten (Captopril) Other (See Comments)    unknown  . Penicillins Swelling  . Propoxyphene And Methadone     Intolerance to darvocet    Patient Measurements: Height: 5\' 3"  (160 cm) Weight: 214 lb 6.4 oz (97.251 kg) IBW/kg (Calculated) : 52.4  Heparin Dosing Weight: 78kg  Vital Signs: Temp: 98.6 F (37 C) (11/24 0446) Temp src: Oral (11/24 0446) BP: 100/65 mmHg (11/24 0446) Pulse Rate: 76  (11/24 0446)  Labs:  Basename 05/27/12 0625 05/26/12 1541 05/26/12 1007 05/26/12 1003 05/26/12 0618 05/26/12 0615 05/26/12 0147 05/25/12 1909  HGB 9.7* -- -- -- -- 9.8* -- --  HCT 28.7* -- -- -- -- 29.8* -- 30.7*  PLT 226 -- -- -- -- 235 -- 265  APTT -- -- -- -- -- -- -- --  LABPROT 14.8 -- -- -- 15.3* -- -- 14.2  INR 1.18 -- -- -- 1.23 -- -- 1.11  HEPARINUNFRC 0.30 0.34 0.40 -- -- -- -- --  CREATININE -- -- -- -- -- -- -- 1.56*  CKTOTAL -- -- -- -- -- -- -- --  CKMB -- -- -- -- -- -- -- --  TROPONINI -- -- -- 0.64* -- 0.75* 0.79* --    Estimated Creatinine Clearance: 33.6 ml/min (by C-G formula based on Cr of 1.56).   Medical History: Past Medical History  Diagnosis Date  . Coronary artery disease     Severe two vessel; s/p CABG  . Hypertension   . Diabetes mellitus     Type 2  . Hyperlipidemia   . Diabetic neuropathy   . PAF (paroxysmal atrial fibrillation)     failed medical therapy with amiodarone; not a candidate for class IC meds due to her CAD. No Multaq due to CHF hx; QT prolonged with amiodarone; Not able to be ablated due to valve disease  . CVA (cerebral vascular accident) 2004  . Hypothyroidism   . Chronic anemia   . Diastolic dysfunction     per echo in May of 2012  . GERD (gastroesophageal reflux disease)   . Arthritis   . Valvular heart  disease     has moderate mitral insufficiency with severe tricuspid insufficiency per TEE in November 2012  . Memory loss, short term   . COPD (chronic obstructive pulmonary disease)   . Blood transfusion without reported diagnosis   . Heart attack     Medications:  Prescriptions prior to admission  Medication Sig Dispense Refill  . amitriptyline (ELAVIL) 25 MG tablet Take 75 mg by mouth at bedtime.        Marland Kitchen aspirin EC 81 MG tablet Take 81 mg by mouth daily.      Marland Kitchen atorvastatin (LIPITOR) 40 MG tablet Take 40 mg by mouth every evening.       . donepezil (ARICEPT) 5 MG tablet Take 5 mg by mouth at bedtime.      Marland Kitchen esomeprazole (NEXIUM) 40 MG capsule Take 40 mg by mouth daily before breakfast.        . glimepiride (AMARYL) 4 MG tablet Take 0.5 tablets (2 mg total) by mouth 2 (two) times daily.      . insulin glargine (LANTUS) 100 UNIT/ML injection Inject 35 Units into the skin 2 (two) times daily.      Marland Kitchen levothyroxine (SYNTHROID, LEVOTHROID) 100 MCG tablet  Take 100 mcg by mouth daily.      . metoprolol (LOPRESSOR) 50 MG tablet Take 25 mg by mouth 2 (two) times daily.       Scheduled:     . amitriptyline  75 mg Oral QHS  . aspirin EC  81 mg Oral Daily  . atorvastatin  40 mg Oral QPM  . coumadin book   Does not apply Once  . diltiazem  10 mg Intravenous Once  . donepezil  5 mg Oral QHS  . furosemide  40 mg Intravenous BID  . glimepiride  2 mg Oral BID WC  . insulin glargine  35 Units Subcutaneous BID  . levothyroxine  100 mcg Oral QAC breakfast  . metoprolol succinate  50 mg Oral BID  . off the beat book   Does not apply Once  . pantoprazole  40 mg Oral Daily  . patient's guide to using coumadin book   Does not apply Once  . warfarin  5 mg Oral ONCE-1800  . warfarin   Does not apply Once  . Warfarin - Pharmacist Dosing Inpatient   Does not apply q1800  . [DISCONTINUED] diltiazem  120 mg Oral Daily  . [DISCONTINUED] metoprolol  75 mg Oral BID  . [DISCONTINUED] warfarin  7.5 mg  Oral NOW  . [DISCONTINUED] warfarin   Does not apply Once    Assessment: 78yo female admitted with Aflutter with RVR, had been on Coumadin in past but had stopped s/p GIB.  Started on IV heparin.  Heparin level at goal on 1150 units/hr but trending down.  No bleeding or complications noted per chart note.  Per MD note, Hgb has been stable, and no current BRBPR.  Pharmacy asked to resume Coumadin today.  It appears from April 2013 admission, Coumadin dosage was 5 mg daily except 10 mg 2 days per week.  Her dosage requirements may be different now due to concomitant medications.  Goal of Therapy:  INR 2-3 Heparin level 0.3-0.7 units/ml Monitor platelets by anticoagulation protocol: Yes   Plan:  1. Increase heparin a little to 1250 units/hr to try to keep in therapeutic range.. 2. Check heparin level in 8 hrs. 3. Daily heparin level and CBC. 4. Coumadin 5 mg po x 1. 5. Daily INR.  CarneyGwenlyn Found PharmD BCPS 05/27/2012,10:32 AM

## 2012-05-27 NOTE — Progress Notes (Addendum)
Patient ID: [name], female   DOB: 03-10-1933, 78 y.o.   MRN: [id number]    SUBJECTIVE: Patient was in rate-controlled atrial flutter yesterday.  She appeared to go into atrial fibrillation with rate in the 100s early this morning.  A few minutes ago, she went into atrial flutter again with rate in the 150s.  She denies dyspnea or chest pain but has been "tired" when she walks.       Marland Kitchen amitriptyline  75 mg Oral QHS  . aspirin EC  81 mg Oral Daily  . atorvastatin  40 mg Oral QPM  . coumadin book   Does not apply Once  . diltiazem  10 mg Intravenous Once  . donepezil  5 mg Oral QHS  . furosemide  40 mg Intravenous BID  . glimepiride  2 mg Oral BID WC  . insulin glargine  35 Units Subcutaneous BID  . levothyroxine  100 mcg Oral QAC breakfast  . metoprolol succinate  50 mg Oral BID  . off the beat book   Does not apply Once  . pantoprazole  40 mg Oral Daily  . [DISCONTINUED] diltiazem  120 mg Oral Daily  . [DISCONTINUED] metoprolol  75 mg Oral BID  . [DISCONTINUED] warfarin  7.5 mg Oral NOW  . [DISCONTINUED] warfarin   Does not apply Once      Filed Vitals:   05/26/12 1408 05/26/12 1424 05/26/12 2032 05/27/12 0446  BP: 88/68 105/67 117/75 100/65  Pulse: 75   76  Temp: 97.1 F (36.2 C)  98.2 F (36.8 C) 98.6 F (37 C)  TempSrc: Oral  Oral Oral  Resp: 16  18 18   Height:      Weight:    214 lb 6.4 oz (97.251 kg)  SpO2: 100%  100% 97%    Intake/Output Summary (Last 24 hours) at 05/27/12 1008 Last data filed at 05/27/12 0452  Gross per 24 hour  Intake    480 ml  Output   1000 ml  Net   -520 ml    LABS: Basic Metabolic Panel:  Basename 05/26/12 0147 05/25/12 1909  NA -- 134*  K -- 3.9  CL -- 96  CO2 -- 27  GLUCOSE -- 227*  BUN -- 33*  CREATININE -- 1.56*  CALCIUM -- 9.2  MG 2.0 --  PHOS -- --   Liver Function Tests: No results found for this basename: AST:2,ALT:2,ALKPHOS:2,BILITOT:2,PROT:2,ALBUMIN:2 in the last 72 hours No results found for this  basename: LIPASE:2,AMYLASE:2 in the last 72 hours CBC:  Basename 05/27/12 0625 05/26/12 0615 05/25/12 1909  WBC 6.9 8.6 --  NEUTROABS -- -- 4.9  HGB 9.7* 9.8* --  HCT 28.7* 29.8* --  MCV 90.8 90.6 --  PLT 226 235 --   Cardiac Enzymes:  Basename 05/26/12 1003 05/26/12 0615 05/26/12 0147  CKTOTAL -- -- --  CKMB -- -- --  CKMBINDEX -- -- --  TROPONINI 0.64* 0.75* 0.79*   BNP: No components found with this basename: POCBNP:3 D-Dimer: No results found for this basename: DDIMER:2 in the last 72 hours Hemoglobin A1C:  Basename 05/26/12 0147  HGBA1C 10.6*   Fasting Lipid Panel:  Basename 05/26/12 0615  CHOL 160  HDL 29*  LDLCALC 115*  TRIG 80  CHOLHDL 5.5  LDLDIRECT --   Thyroid Function Tests:  Basename 05/26/12 0147  TSH 0.722  T4TOTAL --  T3FREE --  THYROIDAB --   Anemia Panel: No results found for this basename: VITAMINB12,FOLATE,FERRITIN,TIBC,IRON,RETICCTPCT in the last 72 hours  RADIOLOGY:  Dg Chest Portable 1 View  05/25/2012  *RADIOLOGY REPORT*  Clinical Data: Tachycardia  PORTABLE CHEST - 1 VIEW  Comparison: 05/04/2012  Findings: Previous median sternotomy.  Stable cardiomegaly.  Low lung volumes with crowding of perihilar and bibasilar bronchovascular structures.  No confluent airspace infiltrate.  No effusion.  IMPRESSION:  Stable cardiomegaly   Original Report Authenticated By: D. Andria Rhein, MD     PHYSICAL EXAM General: NAD Neck: JVP 12 cm, no thyromegaly or thyroid nodule.  Lungs: Clear to auscultation bilaterally with normal respiratory effort. CV: Nondisplaced PMI.  Heart tachy irregular S1/S2, +S3, no murmur.  1+ ankle edema.  No carotid bruit.   Abdomen: Soft, nontender, no hepatosplenomegaly, no distention.  Neurologic: Alert and oriented x 3.  Psych: Normal affect. Extremities: No clubbing or cyanosis.   TELEMETRY: Reviewed telemetry pt in atrial flutter, rate controlled yesterday.  Atrial fibrillation with HR in 100s earlier this  morning.  Atrial flutter HR 150s currently.   ASSESSMENT AND PLAN: 78 yo with history of PAF, CAD s/p CABG, CKD, valvular heart disease/right heart failure presented with atrial flutter/RVR.  1. Atrial arrhythmias: Currently atrial flutter, rate 150s.  Had been atrial fibrillation prior and before that had been in atrial flutter.  She had wide-open TR on 11/12 TEE and RV dilation.  She failed amiodarone with prolonged QT interval and is not a candidate for dofetilide or sotalol for the same reason.  Cannot have Ic with CAD and structural heart disease and cannot have Multaq with CHF.  Therefore, no good option for maintenance of NSR.  No atrial fibrillation ablation has been done to the TV disease.   - Try to control rate, will put her back on diltiazem gtt.  - Change beta blocker to Toprol XL 50 mg bid.  - Continue heparin gtt - If we cannot control rate, will need to consider TEE-guided cardioversion tomorrow morning.  Given her valvular disease, I think that the chances of keeping her in NSR long-term will be small.  - I am going to restart coumadin.  She was off with lower GI bleed earlier in the year.  No BRBPR and has been stably anemic.  2. CHF: Acute on chronic ?diastolic CHF.  She is volume overloaded on exam.  She was on Lasix bid at home but it was not continued here.  Creatinine is 1.5, this is baseline for her it appears.  - Lasix 40 mg IV bid, would start after rate is slowed a bit.  - She will need an echocardiogram.  3. CKD: Appears to be at baseline with creatinine 1.5.  Will need to follow with diuresis.  4. Anemia: HCT 28.7 this morning.  Has been anemic chronically.  No BRBPR.  Will guaiac stool since I am restarting coumadin.  5. Elevated troponin: Mild.  Suspect demand ischemia with atrial flutter/fibrillation and RVR as well as volume overload.  No chest pain.  Will continue ASA 81 for now.  She is on heparin.  Will likely need outpatient functional study.   Marca Ancona 05/27/2012 10:18 AM

## 2012-05-27 NOTE — Progress Notes (Signed)
ANTICOAGULATION CONSULT NOTE - Follow-up Consult  Pharmacy Consult for heparin Indication: chest pain/ACS and atrial fibrillation  Allergies  Allergen Reactions  . Betadine (Povidone Iodine) Itching  . Capoten (Captopril) Other (See Comments)    unknown  . Penicillins Swelling  . Propoxyphene And Methadone     Intolerance to darvocet    Patient Measurements: Height: 5\' 3"  (160 cm) Weight: 214 lb 6.4 oz (97.251 kg) IBW/kg (Calculated) : 52.4  Heparin Dosing Weight: 78kg  Vital Signs: Temp: 97.6 F (36.4 C) (11/24 1353) Temp src: Oral (11/24 1353) BP: 108/55 mmHg (11/24 1727) Pulse Rate: 80  (11/24 1727)  Labs:  Basename 05/27/12 1839 05/27/12 0625 05/26/12 1541 05/26/12 1003 05/26/12 0618 05/26/12 0615 05/26/12 0147 05/25/12 1909  HGB -- 9.7* -- -- -- 9.8* -- --  HCT -- 28.7* -- -- -- 29.8* -- 30.7*  PLT -- 226 -- -- -- 235 -- 265  APTT -- -- -- -- -- -- -- --  LABPROT -- 14.8 -- -- 15.3* -- -- 14.2  INR -- 1.18 -- -- 1.23 -- -- 1.11  HEPARINUNFRC 0.39 0.30 0.34 -- -- -- -- --  CREATININE -- -- -- -- -- -- -- 1.56*  CKTOTAL -- -- -- -- -- -- -- --  CKMB -- -- -- -- -- -- -- --  TROPONINI -- -- -- 0.64* -- 0.75* 0.79* --    Estimated Creatinine Clearance: 33.6 ml/min (by C-G formula based on Cr of 1.56).   Assessment: 78yo female admitted with Aflutter with RVR, had been on Coumadin in past but had stopped s/p GIB.  Started on IV heparin.  Heparin level at goal on 1250 units/hr.  No bleeding or complications noted per chart note.  Goal of Therapy:  Heparin level 0.3-0.7 units/ml Monitor platelets by anticoagulation protocol: Yes   Plan:  1. Continue heparin drip at 1250 units/hr  2. Daily heparin level and CBC 3. Daily INR  [facility], [address].D., BCPS Clinical Pharmacist Pager: [phone] 05/27/2012 7:30 PM

## 2012-05-27 NOTE — Progress Notes (Signed)
[facility]  Patient Status: Active (receiving services up to time of hospitalization)  [facility] is providing the following services: RN and PT.   If patient discharges after hours, please call [phone].   Jodene Nam 05/27/2012, 8:11 PM

## 2012-05-28 ENCOUNTER — Ambulatory Visit: Payer: BC Managed Care – PPO | Admitting: Nurse Practitioner

## 2012-05-28 DIAGNOSIS — I5022 Chronic systolic (congestive) heart failure: Secondary | ICD-10-CM | POA: Diagnosis present

## 2012-05-28 DIAGNOSIS — I4891 Unspecified atrial fibrillation: Secondary | ICD-10-CM

## 2012-05-28 DIAGNOSIS — I369 Nonrheumatic tricuspid valve disorder, unspecified: Secondary | ICD-10-CM

## 2012-05-28 DIAGNOSIS — I509 Heart failure, unspecified: Secondary | ICD-10-CM

## 2012-05-28 DIAGNOSIS — I5033 Acute on chronic diastolic (congestive) heart failure: Secondary | ICD-10-CM

## 2012-05-28 LAB — BASIC METABOLIC PANEL
CO2: 28 mEq/L (ref 19–32)
CO2: 28 mEq/L (ref 19–32)
GFR calc non Af Amer: 27 mL/min — ABNORMAL LOW (ref >90)
Glucose, Bld: 113 mg/dL — ABNORMAL HIGH (ref 70–99)
Glucose, Bld: 192 mg/dL — ABNORMAL HIGH (ref 70–99)
Potassium: 3.5 mEq/L (ref 3.5–5.1)
Potassium: 3.8 mEq/L (ref 3.5–5.1)
Sodium: 137 mEq/L (ref 135–145)
Sodium: 135 mEq/L (ref 135–145)

## 2012-05-28 LAB — CBC
Hemoglobin: 9.4 g/dL — ABNORMAL LOW (ref 12.0–15.0)
MCHC: 32.9 g/dL (ref 30.0–36.0)
Platelets: 236 10*3/uL (ref 150–400)
RBC: 3.17 MIL/uL — ABNORMAL LOW (ref 3.87–5.11)

## 2012-05-28 LAB — PROTIME-INR
INR: 1.02 (ref 0.00–1.49)
Prothrombin Time: 13.3 seconds (ref 11.6–15.2)

## 2012-05-28 LAB — HEPARIN LEVEL (UNFRACTIONATED): Heparin Unfractionated: 0.53 IU/mL (ref 0.30–0.70)

## 2012-05-28 LAB — GLUCOSE, CAPILLARY
Glucose-Capillary: 121 mg/dL — ABNORMAL HIGH (ref 70–99)
Glucose-Capillary: 253 mg/dL — ABNORMAL HIGH (ref 70–99)

## 2012-05-28 MED ORDER — DILTIAZEM HCL 60 MG PO TABS
60.0000 mg | ORAL_TABLET | Freq: Four times a day (QID) | ORAL | Status: DC
  Administered 2012-05-28 (×4): 60 mg via ORAL
  Filled 2012-05-28 (×8): qty 1

## 2012-05-28 MED ORDER — WARFARIN SODIUM 5 MG PO TABS
5.0000 mg | ORAL_TABLET | Freq: Once | ORAL | Status: AC
  Administered 2012-05-28: 5 mg via ORAL
  Filled 2012-05-28: qty 1

## 2012-05-28 NOTE — Progress Notes (Signed)
ANTICOAGULATION CONSULT NOTE - Follow-Up Consult  Pharmacy Consult for Heparin and Coumadin Indication: chest pain/ACS and atrial fibrillation  Allergies  Allergen Reactions  . Betadine (Povidone Iodine) Itching  . Capoten (Captopril) Other (See Comments)    unknown  . Penicillins Swelling  . Propoxyphene And Methadone     Intolerance to darvocet    Patient Measurements: Height: 5\' 3"  (160 cm) Weight: 213 lb 13.5 oz (97 kg) IBW/kg (Calculated) : 52.4  Heparin Dosing Weight: 78kg  Vital Signs: Temp: 97.9 F (36.6 C) (11/25 0528) Temp src: Oral (11/25 0528) BP: 97/57 mmHg (11/25 0528) Pulse Rate: 77  (11/25 0528)  Labs:  Basename 05/28/12 0700 05/27/12 1839 05/27/12 0625 05/26/12 1003 05/26/12 0618 05/26/12 0615 05/26/12 0147 05/25/12 1909  HGB 9.4* -- 9.7* -- -- -- -- --  HCT 28.6* -- 28.7* -- -- 29.8* -- --  PLT 236 -- 226 -- -- 235 -- --  APTT -- -- -- -- -- -- -- --  LABPROT 13.3 -- 14.8 -- 15.3* -- -- --  INR 1.02 -- 1.18 -- 1.23 -- -- --  HEPARINUNFRC 0.53 0.39 0.30 -- -- -- -- --  CREATININE 1.74* -- -- -- -- -- -- 1.56*  CKTOTAL -- -- -- -- -- -- -- --  CKMB -- -- -- -- -- -- -- --  TROPONINI -- -- -- 0.64* -- 0.75* 0.79* --    Estimated Creatinine Clearance: 30 ml/min (by C-G formula based on Cr of 1.74).  Assessment: 78yo female admitted with Aflutter with RVR, had been on Coumadin in past but had stopped s/p GIB.  Started on IV heparin.  Heparin level 0.53 (therapeutic) on 1250 units/hr.  No bleeding or complications noted per chart note. Noted Hgb low but relatively stable. INR 1.02 past first dose of coumadin given yesterday. Noted goal INR 2-2.5 with h/o GIB. (It appears from April 2013 admission, Coumadin dosage was 5 mg daily except 10 mg 2 days per week.)  Her dosage requirements may be different now due to concomitant medications.  Goal of Therapy:  INR 2-2.5 (per MD) Heparin level 0.3-0.7 units/ml Monitor platelets by anticoagulation protocol:  Yes   Plan:  1. Continue heparin at 1250 units/hr. 2. Daily heparin level and CBC. 3. Coumadin 5 mg po x 1. 4. Daily INR.  Christoper Fabian, PharmD, BCPS Clinical pharmacist, pager [phone] 05/28/2012,11:25 AM

## 2012-05-28 NOTE — Progress Notes (Signed)
Subjective: Admitted by Cards for Afib RVR and small demand MI. Still on Cardizem gtt. In good spirits. Case discussed with Dr [place]. We went back over DM care, recent Groin wound Abscess and I and D, as well of Lower GI/Diverticular Bleed from 10/2011  No new C/o.   Objective: Vital signs in last 24 hours: Temp:  [97.4 F (36.3 C)-97.9 F (36.6 C)] 97.9 F (36.6 C) (11/25 0528) Pulse Rate:  [77-113] 77  (11/25 0528) Resp:  [16] 16  (11/25 0528) BP: (97-146)/(52-70) 97/57 mmHg (11/25 0528) SpO2:  [98 %-100 %] 99 % (11/25 0528) Weight:  [97 kg (213 lb 13.5 oz)] 97 kg (213 lb 13.5 oz) (11/25 0528) Weight change: -0.251 kg (-8.9 oz) Last BM Date: 05/26/12  CBG (last 3)   Basename 05/28/12 0616 05/27/12 2039 05/27/12 1613  GLUCAP 121* 297* 249*    Intake/Output from previous day:  Intake/Output Summary (Last 24 hours) at 05/28/12 0752 Last data filed at 05/28/12 0646  Gross per 24 hour  Intake    240 ml  Output   1400 ml  Net  -1160 ml   11/24 0701 - 11/25 0700 In: 240 [P.O.:240] Out: 1400 [Urine:1400]   Physical Exam   Lab Results:  Basename 05/26/12 0147 05/25/12 1909  NA -- 134*  K -- 3.9  CL -- 96  CO2 -- 27  GLUCOSE -- 227*  BUN -- 33*  CREATININE -- 1.56*  CALCIUM -- 9.2  MG 2.0 --  PHOS -- --    No results found for this basename: AST:2,ALT:2,ALKPHOS:2,BILITOT:2,PROT:2,ALBUMIN:2 in the last 72 hours   Basename 05/28/12 0700 05/27/12 0625 05/25/12 1909  WBC 6.5 6.9 --  NEUTROABS -- -- 4.9  HGB 9.4* 9.7* --  HCT 28.6* 28.7* --  MCV 90.2 90.8 --  PLT 236 226 --    Lab Results  Component Value Date   INR 1.18 05/27/2012   INR 1.23 05/26/2012   INR 1.11 05/25/2012     Basename 05/26/12 1003 05/26/12 0615 05/26/12 0147  CKTOTAL -- -- --  CKMB -- -- --  CKMBINDEX -- -- --  TROPONINI 0.64* 0.75* 0.79*     Basename 05/26/12 0147  TSH 0.722  T4TOTAL --  T3FREE --  THYROIDAB --    No results found for this basename:  VITAMINB12:2,FOLATE:2,FERRITIN:2,TIBC:2,IRON:2,RETICCTPCT:2 in the last 72 hours  Micro Results: No results found for this or any previous visit (from the past 240 hour(s)).   Studies/Results: No results found.   Medications: Scheduled:   . amitriptyline  75 mg Oral QHS  . aspirin EC  81 mg Oral Daily  . atorvastatin  40 mg Oral QPM  . [COMPLETED] coumadin book   Does not apply Once  . [COMPLETED] diltiazem  10 mg Intravenous Once  . donepezil  5 mg Oral QHS  . furosemide  40 mg Intravenous BID  . glimepiride  2 mg Oral BID WC  . insulin glargine  35 Units Subcutaneous BID  . levothyroxine  100 mcg Oral QAC breakfast  . metoprolol succinate  50 mg Oral BID  . [COMPLETED] off the beat book   Does not apply Once  . pantoprazole  40 mg Oral Daily  . [COMPLETED] patient's guide to using coumadin book   Does not apply Once  . [COMPLETED] warfarin  5 mg Oral ONCE-1800  . [COMPLETED] warfarin   Does not apply Once  . Warfarin - Pharmacist Dosing Inpatient   Does not apply q1800  . [DISCONTINUED] diltiazem  120 mg Oral Daily  . [DISCONTINUED] metoprolol  75 mg Oral BID   Continuous:   . diltiazem (CARDIZEM) infusion 5 mg/hr (05/27/12 2257)  . heparin 1,250 Units/hr (05/27/12 1952)  . [DISCONTINUED] heparin 1,150 Units/hr (05/27/12 0204)     Assessment/Plan: Active Problems:  * No active hospital problems. *   Afib c RVR - Per Cards.  Hopefuly can be weaned to POs from cardizem gtt.  ECHO for today. Small Demand MI - possible Outpt Stress test R groin Abscess - S/P I and D and Abx.  Keep Wound Vac.  Final Cx= BACTEROIDES THETAIOTAOMICRON & MODERATE STREPTOCOCCUS GROUP G DM2 with Neuropathy/Non-Prolif DM Retinopathy/Cr 1.3-1.8 without microalbuminuria - A1C = 10 - Agree with Lantus 35 BID and ISS.  We just pushed up higher as outpt, but I am always able to manage DM better in the hospital than as outpt.[name] 05/28/12 0616 05/27/12 2039 05/27/12 1613  GLUCAP 121* 297* 249*   DVT Prophylaxis - back on coumadin 10/2011 - Diverticular Bleed - OK to be on Coumadin to avoid CVA.  Just watch for future bleeding.  ? 2 - 2.5 Iron Deficiency Anemia of Chronic Dz - Was 9.0 on D/c.  Stable now.  Had Iron Infusion late last week. Cognitive decline and developing Dementia on Aricept.  HTN - BP on lower side - Monitor     LOS: 3 days   [name] M 05/28/2012, 7:52 AM

## 2012-05-28 NOTE — Progress Notes (Signed)
Home wound to area just below right groin changed.  Spoke with [employer] and she stated that ir could be changed and if patient was going to be here past tomorrow it would need to be changed out to [facility] cone wound vac.  Very minimal drainage in canister, wound with good blood flow, no foul odor or drainage. Wound is 3 cm long by 4 cm wide at the widest part of the wound. No depth to the wound that this nurse could find. Tolerated procedure well.

## 2012-05-28 NOTE — Progress Notes (Signed)
TELEMETRY: Reviewed telemetry pt in atrial fibrillation with controlled rate: Filed Vitals:   05/27/12 1353 05/27/12 1727 05/27/12 2119 05/28/12 0528  BP: 116/52 108/55 146/62 97/57  Pulse: 79 80 81 77  Temp: 97.6 F (36.4 C)  97.4 F (36.3 C) 97.9 F (36.6 C)  TempSrc: Oral  Oral Oral  Resp:   16 16  Height:      Weight:    97 kg (213 lb 13.5 oz)  SpO2: 100%  98% 99%    Intake/Output Summary (Last 24 hours) at 05/28/12 0757 Last data filed at 05/28/12 0646  Gross per 24 hour  Intake    240 ml  Output   1400 ml  Net  -1160 ml    SUBJECTIVE Feels better. Really wasn't aware that heart was out of rhythm. Denies any chest pain or SOB.  LABS: Basic Metabolic Panel:  Basename 05/26/12 0147 05/25/12 1909  NA -- 134*  K -- 3.9  CL -- 96  CO2 -- 27  GLUCOSE -- 227*  BUN -- 33*  CREATININE -- 1.56*  CALCIUM -- 9.2  MG 2.0 --  PHOS -- --   CBC:  Basename 05/28/12 0700 05/27/12 0625 05/25/12 1909  WBC 6.5 6.9 --  NEUTROABS -- -- 4.9  HGB 9.4* 9.7* --  HCT 28.6* 28.7* --  MCV 90.2 90.8 --  PLT 236 226 --   Cardiac Enzymes:  Basename 05/26/12 1003 05/26/12 0615 05/26/12 0147  CKTOTAL -- -- --  CKMB -- -- --  CKMBINDEX -- -- --  TROPONINI 0.64* 0.75* 0.79*   BNP: 2740 Hemoglobin A1C:  Basename 05/26/12 0147  HGBA1C 10.6*   Fasting Lipid Panel:  Basename 05/26/12 0615  CHOL 160  HDL 29*  LDLCALC 115*  TRIG 80  CHOLHDL 5.5  LDLDIRECT --   Thyroid Function Tests:  Basename 05/26/12 0147  TSH 0.722  T4TOTAL --  T3FREE --  THYROIDAB --    Radiology/Studies:    Dg Chest Portable 1 View  05/25/2012  *RADIOLOGY REPORT*  Clinical Data: Tachycardia  PORTABLE CHEST - 1 VIEW  Comparison: 05/04/2012  Findings: Previous median sternotomy.  Stable cardiomegaly.  Low lung volumes with crowding of perihilar and bibasilar bronchovascular structures.  No confluent airspace infiltrate.  No effusion.  IMPRESSION:  Stable cardiomegaly   Original Report  Authenticated By: D. Andria Rhein, MD     PHYSICAL EXAM General: Well developed, obese, in no acute distress. Head: Normocephalic, atraumatic, sclera non-icteric, no xanthomas, nares are without discharge. Neck: Negative for carotid bruits. JVD elevated 8 cm. Lungs: Clear bilaterally to auscultation without wheezes, rales, or rhonchi. Breathing is unlabored. Heart: IRRR S1 S2 without murmurs, rubs, or gallops.  Abdomen: Soft, non-tender, obese,non-distended with normoactive bowel sounds. No hepatomegaly. No rebound/guarding. No obvious abdominal masses. Msk:  Strength and tone appears normal for age. Wound Vac in place right thigh. Extremities: No clubbing, cyanosis or edema.  Distal pedal pulses are 2+ and equal bilaterally. Neuro: Alert and oriented X 3. Moves all extremities spontaneously. Psych:  Responds to questions appropriately with a normal affect.  ASSESSMENT AND PLAN: 1. Atrial fibrillation- rate now controlled on IV cardizem and po toprol XL. Will try and transition to po cardizem today. Start 60 mg QID. Continue toprol 50 mg bid. Last episode of documented Afib in Jan 2013. Not surprising that this has recurred. Not a candidate for amio due to QT prolongation on drug. Not a candidate for sotalol or Tikosyn for same reason. Cannot take 1C  drug due to CAD. Not a candidate for Multaq due to CHF. Therefore, no option for rhythm control. Poor candidate for Afib ablation due to severe TR, right heart failure. Will update Echo today. Resume anticoagulation with coumadin. Target INR 2-2.5 given history of lower GIB.  2.CHF acute on chronic diastolic. Diuresing well. Will continue IV lasix for now.  3. CKD stage 3-4. Need to repeat BMET with diuresis.  4. History of GIB. Prior endoscopy showed a large hiatal hernia. She had diverticuli without active bleed. Presumed source was diverticular bleed. Will need to watch closely on anticoagulation.  5. Chronic anemia  6. DM difficult to  control with patient's dementia and ability to follow at home.   7. Elevated troponin. Doubt primary MI. Most likely demand due to afib. Will consider outpatient myoview.  Active Problems:  * No active hospital problems. *     Signed, Terreon Ekholm [place] MD,FACC 05/28/2012 8:10 AM

## 2012-05-28 NOTE — Progress Notes (Signed)
  Echocardiogram 2D Echocardiogram has been performed.  Cathie Beams 05/28/2012, 8:48 AM

## 2012-05-28 NOTE — Progress Notes (Signed)
Inpatient Diabetes Program Recommendations  AACE/ADA: New Consensus Statement on Inpatient Glycemic Control (2013)  Target Ranges:  Prepandial:   less than 140 mg/dL      Peak postprandial:   less than 180 mg/dL (1-2 hours)      Critically ill patients:  140 - 180 mg/dL   Inpatient Diabetes Program Recommendations Correction (SSI): Add Novolog Moderate scale TID Thank you  Piedad Climes [facility] Inpatient Diabetes Coordinator [phone]

## 2012-05-29 DIAGNOSIS — I5023 Acute on chronic systolic (congestive) heart failure: Secondary | ICD-10-CM

## 2012-05-29 LAB — GLUCOSE, CAPILLARY
Glucose-Capillary: 219 mg/dL — ABNORMAL HIGH (ref 70–99)
Glucose-Capillary: 208 mg/dL — ABNORMAL HIGH (ref 70–99)

## 2012-05-29 LAB — CBC
MCH: 30.4 pg (ref 26.0–34.0)
MCHC: 33.9 g/dL (ref 30.0–36.0)
Platelets: 243 10*3/uL (ref 150–400)

## 2012-05-29 LAB — BASIC METABOLIC PANEL
Calcium: 9.3 mg/dL (ref 8.4–10.5)
GFR calc non Af Amer: 29 mL/min — ABNORMAL LOW (ref >90)
Glucose, Bld: 96 mg/dL (ref 70–99)
Sodium: 135 mEq/L (ref 135–145)

## 2012-05-29 LAB — PROTIME-INR
INR: 1.07 (ref 0.00–1.49)
Prothrombin Time: 13.8 seconds (ref 11.6–15.2)

## 2012-05-29 MED ORDER — DILTIAZEM HCL ER COATED BEADS 240 MG PO CP24
240.0000 mg | ORAL_CAPSULE | Freq: Every day | ORAL | Status: DC
  Administered 2012-05-29 – 2012-05-30 (×2): 240 mg via ORAL
  Filled 2012-05-29 (×2): qty 1

## 2012-05-29 MED ORDER — GLIMEPIRIDE 2 MG PO TABS
2.0000 mg | ORAL_TABLET | Freq: Every day | ORAL | Status: DC
  Administered 2012-05-30: 2 mg via ORAL
  Filled 2012-05-29 (×2): qty 1

## 2012-05-29 MED ORDER — FUROSEMIDE 40 MG PO TABS
40.0000 mg | ORAL_TABLET | Freq: Two times a day (BID) | ORAL | Status: DC
  Administered 2012-05-29 – 2012-05-30 (×3): 40 mg via ORAL
  Filled 2012-05-29 (×5): qty 1

## 2012-05-29 MED ORDER — INSULIN GLARGINE 100 UNIT/ML ~~LOC~~ SOLN
25.0000 [IU] | Freq: Two times a day (BID) | SUBCUTANEOUS | Status: DC
  Administered 2012-05-29: 25 [IU] via SUBCUTANEOUS

## 2012-05-29 MED ORDER — WARFARIN SODIUM 7.5 MG PO TABS
7.5000 mg | ORAL_TABLET | Freq: Once | ORAL | Status: AC
  Administered 2012-05-29: 7.5 mg via ORAL
  Filled 2012-05-29: qty 1

## 2012-05-29 NOTE — Progress Notes (Signed)
Inpatient Diabetes Program Recommendations  AACE/ADA: New Consensus Statement on Inpatient Glycemic Control (2013)  Target Ranges:  Prepandial:   less than 140 mg/dL      Peak postprandial:   less than 180 mg/dL (1-2 hours)      Critically ill patients:  140 - 180 mg/dL   Reason for Visit: Poor po intake on Amaryl  Glucose values up and down.  Inpatient Diabetes Program Recommendations Insulin - Basal: Recommend to decrease lantus to 25 units bid (fasting glucose at 75 mg/dL this am. Correction (SSI): Please use sensitive to moderate correction tidwc. Oral Agents: Intake is documented at 10%. Please do not use amaryl at all while in the hospital, esp while not eating at least 50%. Pt got Amaryl 2 mg with supper last night for 10% po intake.. Fasting glucose this am (maybe due to lantus) was 75 mg/dL.  Note: Thank you, Lenor Coffin, RN, CNS, Diabetes Coordinator [phone])

## 2012-05-29 NOTE — Care Management Note (Unsigned)
    Page 1 of 1   05/29/2012     4:34:16 PM   CARE MANAGEMENT NOTE 05/29/2012  Patient:  [name], [name]   Account Number:  [id number]  Date Initiated:  05/29/2012  Documentation initiated by:  Antonia Culbertson  Subjective/Objective Assessment:   PT ADM ON 05/25/12 WITH AFIB WITH RVR. PTA, PT INDEPENDENT, LIVES WITH SPOUSE.  SHE HAS HOME WOUND VAC FROM ABDOMINAL WOUND INFECTION AND IS ACTIVE WITH [facility] FOR HHRN.     Action/Plan:   MET WITH PT TO DISCUSS DC PLANS.  HUSBAND TO PROVIDE CARE AT DC.  WILL FOLLOW FOR HOME NEEDS AS PT PROGRESSES.   Anticipated DC Date:  05/30/2012   Anticipated DC Plan:  HOME W HOME HEALTH SERVICES         [facility] Choice  Resumption Of Svcs/PTA Provider   Choice offered to / List presented to:          [facility] arranged  HH-1 RN      [facility] agency  [facility]   Status of service:  In process, will continue to follow Medicare Important Message given?   (If response is "NO", the following Medicare IM given date fields will be blank) Date Medicare IM given:   Date Additional Medicare IM given:    Discharge Disposition:  HOME W HOME HEALTH SERVICES  Per UR Regulation:  Reviewed for med. necessity/level of care/duration of stay  If discussed at Long Length of Stay Meetings, dates discussed:    Comments:

## 2012-05-29 NOTE — Progress Notes (Signed)
ANTICOAGULATION CONSULT NOTE - Follow-Up Consult  Pharmacy Consult for Heparin and Coumadin Indication: chest pain/ACS and atrial fibrillation  Allergies  Allergen Reactions  . Betadine (Povidone Iodine) Itching  . Capoten (Captopril) Other (See Comments)    unknown  . Penicillins Swelling  . Propoxyphene And Methadone     Intolerance to darvocet    Patient Measurements: Height: 5\' 3"  (160 cm) Weight: 214 lb 1.6 oz (97.115 kg) IBW/kg (Calculated) : 52.4  Heparin Dosing Weight: 78kg  Vital Signs: Temp: 97.6 F (36.4 C) (11/26 0436) Temp src: Oral (11/26 0436) BP: 118/73 mmHg (11/26 0436) Pulse Rate: 76  (11/26 0436)  Labs:  Basename 05/29/12 0515 05/28/12 1227 05/28/12 0700 05/27/12 1839 05/27/12 0625  HGB 9.5* -- 9.4* -- --  HCT 28.0* -- 28.6* -- 28.7*  PLT 243 -- 236 -- 226  APTT -- -- -- -- --  LABPROT 13.8 -- 13.3 -- 14.8  INR 1.07 -- 1.02 -- 1.18  HEPARINUNFRC 0.50 -- 0.53 0.39 --  CREATININE 1.63* 1.60* 1.74* -- --  CKTOTAL -- -- -- -- --  CKMB -- -- -- -- --  TROPONINI -- -- -- -- --    Estimated Creatinine Clearance: 32.1 ml/min (by C-G formula based on Cr of 1.63).  Assessment: 78yo female admitted with Aflutter with RVR, had been on Coumadin in past but had stopped s/p GIB.  Heparin level 0.5 (therapeutic) on 1250 units/hr.  No bleeding or complications noted per chart note. Noted Hgb low but relatively stable. INR 1.07 past two 5mg  doses of coumadin given. Noted goal INR 2-2.5 with h/o GIB. (It appears from April 2013 admission, Coumadin dosage was 5 mg daily except 10 mg 2 days per week.)  Her dosage requirements may be different now due to concomitant medications.  Goal of Therapy:  INR 2-2.5 (per MD) Heparin level 0.3-0.7 units/ml Monitor platelets by anticoagulation protocol: Yes   Plan:  1. Continue heparin at 1250 units/hr. 2. Daily heparin level and CBC. 3. Coumadin 7.5 mg po x 1. 4. Daily INR.  Christoper Fabian, PharmD, BCPS Clinical  pharmacist, pager [phone] 05/29/2012,10:10 AM

## 2012-05-29 NOTE — Progress Notes (Signed)
TELEMETRY: Reviewed telemetry pt in atrial fibrillation with controlled rate: Filed Vitals:   05/28/12 0528 05/28/12 1504 05/28/12 2116 05/29/12 0436  BP: 97/57 136/53 114/54 118/73  Pulse: 77 80 77 76  Temp: 97.9 F (36.6 C) 97.8 F (36.6 C) 97.2 F (36.2 C) 97.6 F (36.4 C)  TempSrc: Oral Oral Oral Oral  Resp: 16 16 18 20   Height:      Weight: 97 kg (213 lb 13.5 oz)   97.115 kg (214 lb 1.6 oz)  SpO2: 99% 97% 97% 99%    Intake/Output Summary (Last 24 hours) at 05/29/12 0838 Last data filed at 05/29/12 0500  Gross per 24 hour  Intake 1392.5 ml  Output   1300 ml  Net   92.5 ml    SUBJECTIVE Feels better. Feels a little bit of flutter in chest. Denies any chest pain or SOB.  LABS: Basic Metabolic Panel:  Basename 05/29/12 0515 05/28/12 1227  NA 135 135  K 3.5 3.8  CL 98 96  CO2 27 28  GLUCOSE 96 192*  BUN 32* 33*  CREATININE 1.63* 1.60*  CALCIUM 9.3 9.2  MG -- --  PHOS -- --   CBC:  Basename 05/29/12 0515 05/28/12 0700  WBC 7.1 6.5  NEUTROABS -- --  HGB 9.5* 9.4*  HCT 28.0* 28.6*  MCV 89.5 90.2  PLT 243 236   Cardiac Enzymes:  Basename 05/26/12 1003  CKTOTAL --  CKMB --  CKMBINDEX --  TROPONINI 0.64*   BNP: 2740 Hemoglobin A1C: 10.6  Fasting Lipid Panel: Cholesterol 160 0 - 200 mg/dL Final Triglycerides 80 <150 mg/dL Final HDL 29 (L) >16 mg/dL Final Total CHOL/HDL Ratio 5.5 RATIO Final VLDL 16 0 - 40 mg/dL Final LDL Cholesterol 109 (H) 0 - 99 mg/dL Final    Thyroid Function Tests: TSH: .722  Radiology/Studies:    Dg Chest Portable 1 View  05/25/2012  *RADIOLOGY REPORT*  Clinical Data: Tachycardia  PORTABLE CHEST - 1 VIEW  Comparison: 05/04/2012  Findings: Previous median sternotomy.  Stable cardiomegaly.  Low lung volumes with crowding of perihilar and bibasilar bronchovascular structures.  No confluent airspace infiltrate.  No effusion.  IMPRESSION:  Stable cardiomegaly   Original Report Authenticated By: D. Andria Rhein, MD     ECHO:Study Conclusions  - Left ventricle: The cavity size was mildly dilated. Wall thickness was normal. The estimated ejection fraction was 30%. Diffuse hypokinesis. - Aortic valve: There was mild stenosis. - Mitral valve: Calcified annulus. Mild regurgitation. - Left atrium: The atrium was mildly dilated. - Right atrium: The atrium was moderately dilated. - Atrial septum: No defect or patent foramen ovale was identified. - Tricuspid valve: Moderate regurgitation. - Pulmonary arteries: PA peak pressure: 34mm Hg (S).   PHYSICAL EXAM General: Well developed, obese, in no acute distress. Head: Normocephalic, atraumatic, sclera non-icteric, no xanthomas, nares are without discharge. Neck: Negative for carotid bruits. JVD elevated 8 cm. Lungs: Clear bilaterally to auscultation without wheezes, rales, or rhonchi. Breathing is unlabored. Heart: IRRR S1 S2 without murmurs, rubs, or gallops.  Abdomen: Soft, non-tender, obese,non-distended with normoactive bowel sounds. No hepatomegaly. No rebound/guarding. No obvious abdominal masses. Msk:  Strength and tone appears normal for age. Wound Vac in place right thigh. Extremities: 1+ edema.  Distal pedal pulses are 2+ and equal bilaterally. Neuro: Alert and oriented X 3. Moves all extremities spontaneously. Psych:  Responds to questions appropriately with a normal affect.  ASSESSMENT AND PLAN: 1. Atrial fibrillation- rate now controlled on po cardizem and  po toprol XL. Will try and transition to long acting cardizem today. Start 240 mg daily. Continue toprol 50 mg bid. Not a candidate for amio due to QT prolongation on drug. Not a candidate for sotalol or Tikosyn for same reason. Cannot take 1C drug due to CAD. Not a candidate for Multaq due to CHF. Therefore, no option for rhythm control. Poor candidate for Afib ablation due to severe TR, right heart failure.  Resume anticoagulation with coumadin. Target INR 2-2.5 given history of lower GIB.    2.CHF acute on chronic systolic. EF has decreased to 30%. Was 50% by last Echo in 2012. This may be related to arrhythmia. Will need to reassess coronary perfusion. Will transition to po Lasix today. History of intolerance to ACEi in past. Consider low dose ARB as outpatient if BP allows. Would need to follow renal function closely.  3. CKD stage 3-4. BMET stable with diuresis.  4. History of GIB. Prior endoscopy showed a large hiatal hernia. She had diverticuli without active bleed. Presumed source was diverticular bleed. Will need to watch closely on anticoagulation.  5. Chronic anemia  6. DM difficult to control with patient's dementia and ability to follow at home.   7. CAD s/p CABG x 3 2010. Elevated troponin on admission c/w increased demand of ischemia. Lower EF is a concern. Discussed further evaluation with either cath or myoview. Cardiac cath risk increased due to CKD. Since patient has had no angina I would favor a [employer] as outpatient.  8. Absess right thigh. Wound vac in place.  Disposition: if patient is stable on oral meds will plan discharge in am with F/u myoview as outpatient. Will need f/u in coumadin clinic at [facility].  Principal Problem:  *Atrial fibrillation Active Problems:  CAD (coronary artery disease)  Anemia associated with chronic renal failure  CKD (chronic kidney disease) stage 3, GFR 30-59 ml/min  Acute on chronic diastolic CHF (congestive heart failure)    Signed, Sarita Hakanson [place] MD,FACC 05/29/2012 8:38 AM

## 2012-05-30 ENCOUNTER — Encounter (HOSPITAL_COMMUNITY): Payer: Self-pay | Admitting: Nurse Practitioner

## 2012-05-30 ENCOUNTER — Other Ambulatory Visit: Payer: BC Managed Care – PPO | Admitting: Lab

## 2012-05-30 DIAGNOSIS — I4892 Unspecified atrial flutter: Principal | ICD-10-CM

## 2012-05-30 LAB — PROTIME-INR: INR: 1.16 (ref 0.00–1.49)

## 2012-05-30 LAB — GLUCOSE, CAPILLARY
Glucose-Capillary: 77 mg/dL (ref 70–99)
Glucose-Capillary: 116 mg/dL — ABNORMAL HIGH (ref 70–99)

## 2012-05-30 LAB — BASIC METABOLIC PANEL
BUN: 29 mg/dL — ABNORMAL HIGH (ref 6–23)
Chloride: 100 mEq/L (ref 96–112)
GFR calc Af Amer: 32 mL/min — ABNORMAL LOW (ref >90)
Potassium: 3.8 mEq/L (ref 3.5–5.1)
Sodium: 138 mEq/L (ref 135–145)

## 2012-05-30 LAB — CBC
HCT: 29.1 % — ABNORMAL LOW (ref 36.0–46.0)
RDW: 15.2 % (ref 11.5–15.5)
WBC: 6.8 10*3/uL (ref 4.0–10.5)

## 2012-05-30 LAB — HEPARIN LEVEL (UNFRACTIONATED): Heparin Unfractionated: 0.54 IU/mL (ref 0.30–0.70)

## 2012-05-30 MED ORDER — WARFARIN SODIUM 5 MG PO TABS: 5.0000 mg | ORAL_TABLET | Freq: Every day | ORAL | Status: DC

## 2012-05-30 MED ORDER — FUROSEMIDE 40 MG PO TABS: 40.0000 mg | ORAL_TABLET | Freq: Two times a day (BID) | ORAL | Status: DC

## 2012-05-30 MED ORDER — DILTIAZEM HCL ER COATED BEADS 240 MG PO CP24: 240.0000 mg | ORAL_CAPSULE | Freq: Every day | ORAL | Status: DC

## 2012-05-30 MED ORDER — WARFARIN SODIUM 7.5 MG PO TABS
7.5000 mg | ORAL_TABLET | Freq: Once | ORAL | Status: DC
  Filled 2012-05-30: qty 1

## 2012-05-30 MED ORDER — NITROGLYCERIN 0.4 MG SL SUBL: 0.4000 mg | SUBLINGUAL_TABLET | SUBLINGUAL | Status: DC | PRN

## 2012-05-30 MED ORDER — POTASSIUM CHLORIDE CRYS ER 20 MEQ PO TBCR: 20.0000 meq | EXTENDED_RELEASE_TABLET | Freq: Two times a day (BID) | ORAL | Status: DC

## 2012-05-30 MED ORDER — POTASSIUM CHLORIDE CRYS ER 20 MEQ PO TBCR: 20.0000 meq | EXTENDED_RELEASE_TABLET | Freq: Every day | ORAL | Status: DC

## 2012-05-30 MED ORDER — METOPROLOL SUCCINATE ER 50 MG PO TB24: 50.0000 mg | ORAL_TABLET | Freq: Two times a day (BID) | ORAL | Status: DC

## 2012-05-30 MED ORDER — INSULIN GLARGINE 100 UNIT/ML ~~LOC~~ SOLN: 25.0000 [IU] | Freq: Two times a day (BID) | SUBCUTANEOUS | Status: DC

## 2012-05-30 NOTE — Discharge Summary (Signed)
Patient seen and examined and history reviewed. Agree with above findings and plan. See rounding note earlier today.  Theron Arista [facility] 05/30/2012 12:56 PM

## 2012-05-30 NOTE — Progress Notes (Signed)
ANTICOAGULATION CONSULT NOTE - Follow-Up Consult  Pharmacy Consult for Coumadin Indication: atrial fibrillation  Allergies  Allergen Reactions  . Betadine (Povidone Iodine) Itching  . Capoten (Captopril) Other (See Comments)    unknown  . Penicillins Swelling  . Propoxyphene And Methadone     Intolerance to darvocet    Patient Measurements: Height: 5\' 3"  (160 cm) Weight: 214 lb 4.6 oz (97.2 kg) IBW/kg (Calculated) : 52.4  Heparin Dosing Weight: 78kg  Vital Signs: Temp: 97.9 F (36.6 C) (11/27 0359) Temp src: Oral (11/27 0359) BP: 124/53 mmHg (11/27 0359) Pulse Rate: 76  (11/27 0359)  Labs:  Basename 05/30/12 0425 05/29/12 0515 05/28/12 1227 05/28/12 0700  HGB 9.5* 9.5* -- --  HCT 29.1* 28.0* -- 28.6*  PLT 237 243 -- 236  APTT -- -- -- --  LABPROT 14.6 13.8 -- 13.3  INR 1.16 1.07 -- 1.02  HEPARINUNFRC 0.54 0.50 -- 0.53  CREATININE 1.72* 1.63* 1.60* --  CKTOTAL -- -- -- --  CKMB -- -- -- --  TROPONINI -- -- -- --    Estimated Creatinine Clearance: 30.4 ml/min (by C-G formula based on Cr of 1.72).  Assessment: 78yo female admitted with Aflutter with RVR, had been on Coumadin in past but had stopped s/p GIB.  She was initiated on heparin bridging to Coumadin, her heparin was discontinued this morning.  Today's INR 1.16 after a dose increase 11/26. Noted goal INR 2-2.5 with h/o GIB. (It appears from April 2013 admission, Coumadin dosage was 5 mg daily except 10 mg 2 days per week.)  Her dosage requirements may be different now due to concomitant medications.  Goal of Therapy:  INR 2-2.5 (per MD) Heparin level 0.3-0.7 units/ml Monitor platelets by anticoagulation protocol: Yes   Plan:  Coumadin 7.5 mg po x 1. Daily INR.  If she is discharged recommend Coumadin 5mg  daily with a PT/INR check on Friday 11/29.  Estella Husk, Pharm.D., BCPS Clinical Pharmacist  Phone [phone] Pager [phone] 05/30/2012, 11:30 AM

## 2012-05-30 NOTE — Progress Notes (Signed)
TELEMETRY: Reviewed telemetry pt in atrial fibrillation with controlled rate: Filed Vitals:   05/29/12 1100 05/29/12 1422 05/29/12 2012 05/30/12 0359  BP: 121/74 120/72 115/60 124/53  Pulse: 109 80 75 76  Temp:  97.5 F (36.4 C) 98.1 F (36.7 C) 97.9 F (36.6 C)  TempSrc:   Oral Oral  Resp:  18 18 18   Height:      Weight:    97.2 kg (214 lb 4.6 oz)  SpO2:  99% 98% 96%    Intake/Output Summary (Last 24 hours) at 05/30/12 0729 Last data filed at 05/30/12 0016  Gross per 24 hour  Intake    580 ml  Output   2500 ml  Net  -1920 ml    SUBJECTIVE Denies any chest pain or SOB. Slept well.  LABS: Basic Metabolic Panel:  Basename 05/30/12 0425 05/29/12 0515  NA 138 135  K 3.8 3.5  CL 100 98  CO2 28 27  GLUCOSE 116* 96  BUN 29* 32*  CREATININE 1.72* 1.63*  CALCIUM 9.3 9.3  MG -- --  PHOS -- --   CBC:  Basename 05/30/12 0425 05/29/12 0515  WBC 6.8 7.1  NEUTROABS -- --  HGB 9.5* 9.5*  HCT 29.1* 28.0*  MCV 91.8 89.5  PLT 237 243   BNP: 2740 Hemoglobin A1C: 10.6  Fasting Lipid Panel: Cholesterol 160 0 - 200 mg/dL Final Triglycerides 80 <150 mg/dL Final HDL 29 (L) >95 mg/dL Final Total CHOL/HDL Ratio 5.5 RATIO Final VLDL 16 0 - 40 mg/dL Final LDL Cholesterol 284 (H) 0 - 99 mg/dL Final    Thyroid Function Tests: TSH: .722  Radiology/Studies:    Dg Chest Portable 1 View  05/25/2012  *RADIOLOGY REPORT*  Clinical Data: Tachycardia  PORTABLE CHEST - 1 VIEW  Comparison: 05/04/2012  Findings: Previous median sternotomy.  Stable cardiomegaly.  Low lung volumes with crowding of perihilar and bibasilar bronchovascular structures.  No confluent airspace infiltrate.  No effusion.  IMPRESSION:  Stable cardiomegaly   Original Report Authenticated By: D. Andria Rhein, MD    ECHO:Study Conclusions  - Left ventricle: The cavity size was mildly dilated. Wall thickness was normal. The estimated ejection fraction was 30%. Diffuse hypokinesis. - Aortic valve: There was mild  stenosis. - Mitral valve: Calcified annulus. Mild regurgitation. - Left atrium: The atrium was mildly dilated. - Right atrium: The atrium was moderately dilated. - Atrial septum: No defect or patent foramen ovale was identified. - Tricuspid valve: Moderate regurgitation. - Pulmonary arteries: PA peak pressure: 34mm Hg (S).   PHYSICAL EXAM General: Well developed, obese, in no acute distress. Head: Normocephalic, atraumatic, sclera non-icteric, no xanthomas, nares are without discharge. Neck: Negative for carotid bruits. JVD elevated 6 cm. Lungs: Clear bilaterally to auscultation without wheezes, rales, or rhonchi. Breathing is unlabored. Heart: IRRR S1 S2 without murmurs, rubs, or gallops.  Abdomen: Soft, non-tender, obese,non-distended with normoactive bowel sounds. No hepatomegaly. No rebound/guarding. No obvious abdominal masses. Msk:  Strength and tone appears normal for age. Wound Vac in place right thigh. Extremities: 1+ edema.  Distal pedal pulses are 2+ and equal bilaterally. Neuro: Alert and oriented X 3. Moves all extremities spontaneously. Psych:  Responds to questions appropriately with a normal affect.  ASSESSMENT AND PLAN: 1. Atrial fibrillation- rate now controlled on po cardizem and po toprol XL. Not a candidate for amio due to QT prolongation on drug. Not a candidate for sotalol or Tikosyn for same reason. Cannot take 1C drug due to CAD. Not a candidate  for Multaq due to CHF. Therefore, no option for rhythm control. Poor candidate for Afib ablation due to severe TR, right heart failure.  Resume anticoagulation with coumadin. Target INR 2-2.5 given history of lower GIB. Patient is stable for discharge today.  2.CHF acute on chronic systolic. EF has decreased to 30%. Was 50% by last Echo in 2012. This may be related to arrhythmia. Will need to reassess coronary perfusion.  History of intolerance to ACEi in past. Consider low dose ARB as outpatient if BP allows (? diovan 40  mg). Would need to follow renal function closely.  3. CKD stage 3-4. BMET stable with diuresis.  4. History of GIB. Prior endoscopy showed a large hiatal hernia. She had diverticuli without active bleed. Presumed source was diverticular bleed. Will need to watch closely on anticoagulation.  5. Chronic anemia  6. DM difficult to control with patient's dementia and ability to follow at home.   7. CAD s/p CABG x 3 2010. Elevated troponin on admission c/w increased demand of ischemia. Lower EF is a concern. Discussed further evaluation with either cath or myoview. Cardiac cath risk increased due to CKD. Since patient has had no angina I would favor a [employer] as outpatient.  8. Absess right thigh. Wound vac in place.  Disposition: plan discharge today with F/u myoview as outpatient. Will need f/u in [facility]mad[facility] clinic at [facility].  Principal Problem:  *Atrial fibrillation Active Problems:  CAD (coronary artery disease)  Anemia associated with chronic renal failure  CKD (chronic kidney disease) stage 3, GFR 30-59 ml/min  Acute on chronic systolic CHF (congestive heart failure)    Signed, Peter [place] MD,FACC 05/30/2012 7:29 AM

## 2012-05-30 NOTE — Discharge Summary (Signed)
Patient ID: [name],  MRN: [id number], DOB/AGE: [id number] y.o.  Admit date: 05/25/2012 Discharge date: 05/30/2012  Primary Care Provider: [name] Primary Cardiologist: P. [place], MD  Discharge Diagnoses Principal Problem:  *Atrial flutter  **Rate-controlled.  Coumadin re-initiated this admission. Active Problems:  Acute on chronic systolic CHF (congestive heart failure)  **Reduced EF by echo this admission - 30%, diffuse HK.  **Diuresed net negative of 7.25 L.  D/C weight: 214 Lbs.  Atrial fibrillation  Anemia associated with chronic renal failure  Hypothyroidism  CAD (coronary artery disease)  **s/p prior CABG x 3 in 2010.  CKD (chronic kidney disease) stage 3, GFR 30-59 ml/min  Abscess of right thigh s/p I&D 05/07/2012  **Wound Vac in place  History of diverticular bleed  Allergies Allergies  Allergen Reactions  . Betadine (Povidone Iodine) Itching  . Capoten (Captopril) Other (See Comments)    unknown  . Penicillins Swelling  . Propoxyphene And Methadone     Intolerance to darvocet   Procedures  2D Echocardiogram 05/28/2012  Study Conclusions  - Left ventricle: The cavity size was mildly dilated. Wall   thickness was normal. The estimated ejection fraction was   30%. Diffuse hypokinesis. - Aortic valve: There was mild stenosis. - Mitral valve: Calcified annulus. Mild regurgitation. - Left atrium: The atrium was mildly dilated. - Right atrium: The atrium was moderately dilated. - Atrial septum: No defect or patent foramen ovale was   identified. - Tricuspid valve: Moderate regurgitation. - Pulmonary arteries: PA peak pressure: 34mm Hg (S). _____________  History of Present Illness  78 y/o female with the above complex problem list.  She has a h/o paroxysmal atrial fibrillation and has previously failed amiodarone therapy secondary to prolonged QT (which also prevents her from being on tikosyn) and is not a candidate for either 1C  antiarrhythmics (CAD), Multaq (CHF), or ablation secondary to severe tricuspid regurgitation.  She had previously been on coumadin but this was discontinued following a diverticular bleed earlier this year.  On the day of admission, pt went for iron infusion therapy and was found to be tachycardic.  She was sent to the [facility] ED where she was found to be in atrial flutter with a rate of 166.  She was placed on a cardizem infusion after receiving a bolus.  She was admitted for further evaluation.  Hospital Course  With diltiazem, pt had improved rate control and initially was able to be switched to oral diltiazem however she had recurrent rapid aflutter on 11/24 and was placed back on an IV infusion.  This resulted in improved rate control and she was again able to be transitioned to or diltiazem.  In addition, her home dose of metoprolol was titrated and changed to Toprol XL bid.  Though she has prior history of diverticular bleeding, for which coumadin was discontinued, in the setting of recurrent atrial arrhythmias, decision was made to resume coumadin with a goal INR of 2-2.5.  Her H/H has remained stable while on coumadin thus far and we will have her f/u in our office on 11/29 for repeat INR (1.16 today).  In the setting of rapid atrial flutter, her troponin rose to a peak of 0.79, although she had no complaints of chest pain or dyspnea.  Troponin elevation was felt to be secondary to demand ischemia.  Echocardiogram was performed on 11/25, revealing newly depressed LV function, with an EF 30% with diffuse hypokinesis.  In light of troponin elevation and new LV dysfunction, we  have arranged for an outpatient cardiolite next week.  She also had mild volume overload and was treated with IV lasix.  Despite a net negative of 7.25 Liters over the course of this admission, her weight has been relatively steady between 212 and 214 lbs (her d/c weight).  We have not yet added ARB therapy (she has intolerance to  ACEIs) but will consider addition upon outpatient follow-up.    Pt has been seen by her PCP while hospitalized.  Her DM remains poorly controlled with an A1c of 10.6.  Home lantus dose was adusted.  She continues to undergo treatment for a recent groin abscess and wound vac remains in place.  She will be discharged home today in fair condition.  Discharge Vitals Blood pressure 124/53, pulse 76, temperature 97.9 F (36.6 C), temperature source Oral, resp. rate 18, height 5\' 3"  (1.6 m), weight 214 lb 4.6 oz (97.2 kg), SpO2 96.00%.  Filed Weights   05/28/12 0528 05/29/12 0436 05/30/12 0359  Weight: 213 lb 13.5 oz (97 kg) 214 lb 1.6 oz (97.115 kg) 214 lb 4.6 oz (97.2 kg)   Labs  CBC  Basename 05/30/12 0425 05/29/12 0515  WBC 6.8 7.1  NEUTROABS -- --  HGB 9.5* 9.5*  HCT 29.1* 28.0*  MCV 91.8 89.5  PLT 237 243   Basic Metabolic Panel  Basename 05/30/12 0425 05/29/12 0515  NA 138 135  K 3.8 3.5  CL 100 98  CO2 28 27  GLUCOSE 116* 96  BUN 29* 32*  CREATININE 1.72* 1.63*  CALCIUM 9.3 9.3  MG -- --  PHOS -- --   Cardiac Enzymes Lab Results  Component Value Date   CKTOTAL 169 11/12/2010   CKMB 3.3 11/12/2010   TROPONINI 0.64* 05/26/2012   Hemoglobin A1C Lab Results  Component Value Date   HGBA1C 10.6* 05/26/2012   Fasting Lipid Panel Lab Results  Component Value Date   CHOL 160 05/26/2012   HDL 29* 05/26/2012   LDLCALC 115* 05/26/2012   TRIG 80 05/26/2012   CHOLHDL 5.5 05/26/2012   Thyroid Function Tests Lab Results  Component Value Date   TSH 0.722 05/26/2012   Lab Results  Component Value Date   INR 1.16 05/30/2012   INR 1.07 05/29/2012   INR 1.02 05/28/2012   Disposition  Pt is being discharged home today in good condition.  Follow-up Plans & Appointments      Follow-up Information    Follow up with [name], MD. On 06/04/2012. (1-2 wks)    Contact information:   [address] [facility] [place]  [address] [phone]       Follow up with Norma Fredrickson, NP. On 06/11/2012. (11:00 AM - Dr. Elvis Coil Nurse Practitioner)    Contact information:   [address] [place] [address] [phone]       Follow up with [facility]. On 06/01/2012. (2:30 PM)    Contact information:   [address] [place] [address] [phone]      Follow up with [facility]. On 06/07/2012. (9:45 AM Pharmacologic Stress Test.  Nothing to eat or drink after midnight.  No caffeine on 12/4.)    Contact information:   [address] [place] [phone]       Discharge Medications    Medication List     As of 05/30/2012 12:24 PM    STOP taking these medications  acetaminophen 500 MG tablet   Commonly known as: TYLENOL      clindamycin 300 MG capsule   Commonly known as: CLEOCIN      fish oil-omega-3 fatty acids 1000 MG capsule      HUMALOG 100 UNIT/ML injection   Generic drug: insulin lispro      IRON PO      MAGNESIUM PO      metoprolol 50 MG tablet   Commonly known as: LOPRESSOR      Vitamin D 2000 UNITS tablet      TAKE these medications         amitriptyline 25 MG tablet   Commonly known as: ELAVIL   Take 75 mg by mouth at bedtime.      aspirin EC 81 MG tablet   Take 81 mg by mouth daily.      atorvastatin 40 MG tablet   Commonly known as: LIPITOR   Take 40 mg by mouth every evening.      diltiazem 240 MG 24 hr capsule   Commonly known as: CARDIZEM CD   Take 1 capsule (240 mg total) by mouth daily.      donepezil 5 MG tablet   Commonly known as: ARICEPT   Take 5 mg by mouth at bedtime.      esomeprazole 40 MG capsule   Commonly known as: NEXIUM   Take 40 mg by mouth daily before breakfast.      furosemide 40 MG tablet   Commonly known as: LASIX   Take 1 tablet (40 mg total) by mouth 2 (two) times daily.      glimepiride 4 MG tablet   Commonly known as: AMARYL   Take 0.5 tablets (2 mg  total) by mouth 2 (two) times daily.      insulin glargine 100 UNIT/ML injection   Commonly known as: LANTUS   Inject 25 Units into the skin 2 (two) times daily.      levothyroxine 100 MCG tablet   Commonly known as: SYNTHROID, LEVOTHROID   Take 100 mcg by mouth daily.      metoprolol succinate 50 MG 24 hr tablet   Commonly known as: TOPROL-XL   Take 1 tablet (50 mg total) by mouth 2 (two) times daily. Take with or immediately following a meal.      nitroGLYCERIN 0.4 MG SL tablet   Commonly known as: NITROSTAT   Place 1 tablet (0.4 mg total) under the tongue every 5 (five) minutes x 3 doses as needed for chest pain.      potassium chloride SA 20 MEQ tablet   Commonly known as: K-DUR,KLOR-CON   Take 1 tablet (20 mEq total) by mouth daily.      warfarin 5 MG tablet   Commonly known as: COUMADIN   Take 1 tablet (5 mg total) by mouth daily.        Outstanding Labs/Studies  INR on 11/29  Duration of Discharge Encounter   Greater than 30 minutes including physician time.  Signed, Nicolasa Ducking NP 05/30/2012, 12:24 PM

## 2012-06-04 ENCOUNTER — Ambulatory Visit (INDEPENDENT_AMBULATORY_CARE_PROVIDER_SITE_OTHER): Payer: Medicare Other | Admitting: Surgery

## 2012-06-04 ENCOUNTER — Telehealth (INDEPENDENT_AMBULATORY_CARE_PROVIDER_SITE_OTHER): Payer: Self-pay | Admitting: General Surgery

## 2012-06-04 ENCOUNTER — Ambulatory Visit (INDEPENDENT_AMBULATORY_CARE_PROVIDER_SITE_OTHER): Payer: TRICARE For Life (TFL) | Admitting: *Deleted

## 2012-06-04 ENCOUNTER — Encounter (INDEPENDENT_AMBULATORY_CARE_PROVIDER_SITE_OTHER): Payer: Self-pay | Admitting: Surgery

## 2012-06-04 VITALS — BP 138/82 | HR 84 | Resp 18 | Ht 63.0 in | Wt 211.0 lb

## 2012-06-04 DIAGNOSIS — I4891 Unspecified atrial fibrillation: Secondary | ICD-10-CM

## 2012-06-04 DIAGNOSIS — L02415 Cutaneous abscess of right lower limb: Secondary | ICD-10-CM

## 2012-06-04 DIAGNOSIS — Z7901 Long term (current) use of anticoagulants: Secondary | ICD-10-CM | POA: Insufficient documentation

## 2012-06-04 DIAGNOSIS — L03119 Cellulitis of unspecified part of limb: Secondary | ICD-10-CM

## 2012-06-04 DIAGNOSIS — L02419 Cutaneous abscess of limb, unspecified: Secondary | ICD-10-CM

## 2012-06-04 LAB — POCT INR: INR: 1.6

## 2012-06-04 NOTE — Patient Instructions (Signed)
WOUND CARE  It is important that the wound be kept open.   -Keeping the skin edges apart will allow the wound to gradually heal from the base upwards.   - If the skin edges of the wound close too early, a new fluid pocket can form and infection can occur. -This is the reason to pack deeper wounds with gauze or ribbon -This is why drained wounds cannot be sewed closed right away  A healthy wound should form a lining of bright red "beefy" granulating tissue that will help shrink the wound and help the edges grow new skin into it.   -A little mucus / yellow discharge is normal (the body's natural way to try and form a scab) and should be gently washed off with soap and water with daily dressing changes.  -Green or foul smelling drainage implies bacterial colonization and can slow wound healing - a short course of antibiotic ointment (3-5 days) can help it clear up.  Call the doctor if it does not improve or worsens  -Avoid use of antibiotic ointments for more than a week as they can slow wound healing over time.    -Sometimes other wound care products will be used to reduce need for dressing changes and/or help clean up dirty wounds -Sometimes the surgeon needs to debride the wound in the office to remove dead or infected tissue out of the wound so it can heal more quickly and safely.    Change the dressing at least once a day -Wash the wound with mild soap and water gently every day.  It is good to shower or bathe the wound to help it clean out. -Use clean 4x4 gauze or BandAid -Keep the raw wound moist with a little saline or KY (saline) gel on the gauze.  -A dry wound will take longer to heal.  -Keep the skin dry around the wound to prevent breakdown and irritation.  -Cover with a clean Band Aid or tape -paper or Medipore tape tend to be gentle on the skin -rotate the orientation of the tape to avoid repeated stress/trauma on the skin   Returning the see the surgeon is helpful to follow the  healing process and help the wound close as fast as possible.

## 2012-06-04 NOTE — Telephone Encounter (Signed)
Purnell Shoemaker, nurse with [facility], called to give Dr. Michaell Cowing update on pt's wound care.  Pt has been using a wound vac, changed 3 times a week (last on Friday 06/01/12.)  Nurse reports the wound has no drainage any more and thinks it would be appropriate to discontinue it.  Please call nurse after office visit this afternoon with new orders for wound care.

## 2012-06-04 NOTE — Telephone Encounter (Signed)
Spoke with Joyce Gross and made her aware to d/c wound vac. Patient can keep area covered with dry dressing until healed. They will call with any questions.

## 2012-06-04 NOTE — Progress Notes (Signed)
Subjective:     Patient ID: [name], female   DOB: 1933/02/18, 78 y.o.   MRN: [id number]  Wound Check    [name]  [birth year] [id number]  Patient Care Team: [name], MD as PCP - General (Internal Medicine)  This patient is a 78 y.o.female who presents today for surgical evaluation.   Procedure: Incision and drainage of abscess and proximal medial thigh 05/07/2012  Specimen Description TISSUE GROIN RIGHT Special Requests PATIENT ON FOLLOWING VANCO,CLEOCIN Gram Stain ABUNDANT WBC PRESENT, PREDOMINANTLY PMN ABUNDANT GRAM NEGATIVE RODS FEW GRAM POSITIVE COCCI IN PAIRS RARE GRAM POSITIVE RODS Culture MODERATE STREPTOCOCCUS GROUP G Report Status 05/11/2012 FINAL  The patient comes in today with her husband feeling better.  Glucose control good.  They were switched by [facility] to wound vac changes.  Energy level better.  Wound smaller.  Patient Active Problem List  Diagnosis  . Atrial fibrillation  . Hypothyroidism  . Chronic anticoagulation  . CAD (coronary artery disease)  . Diastolic dysfunction  . Anemia associated with chronic renal failure  . Atrial tachycardia  . Right heart failure  . MGUS (monoclonal gammopathy of unknown significance)  . GI bleed  . CKD (chronic kidney disease) stage 3, GFR 30-59 ml/min  . Abscess of right thigh s/p I&D 05/07/2012  . Acute on chronic systolic CHF (congestive heart failure)  . Atrial flutter  . Long term (current) use of anticoagulants    Past Medical History  Diagnosis Date  . Coronary artery disease     a. Severe two vessel; s/p CABG;  b. 05/2012 NSTEMI in setting of rapid aflutter  . Hypertension   . Diabetes mellitus     Type 2  . Hyperlipidemia   . Diabetic neuropathy   . PAF (paroxysmal atrial fibrillation)     a. Failed medical therapy with amiodarone; not a candidate for class IC meds due to her CAD. No Multaq due to CHF hx; QT prolonged with amiodarone; Not able to be ablated due to valve disease  . CVA  (cerebral vascular accident) 2004  . Hypothyroidism   . Chronic anemia   . Diastolic dysfunction     per echo in May of 2012  . GERD (gastroesophageal reflux disease)   . Arthritis   . Memory loss, short term   . COPD (chronic obstructive pulmonary disease)   . Blood transfusion without reported diagnosis   . Chronic combined systolic and diastolic CHF (congestive heart failure)     a. 05/2012 Echo: EF 30%, diff HK, Mild AS, MR, Mod TR, PASP .  . Moderate mitral regurgitation     a. mod by TEE 2012, mild by echo 2013  . Severe tricuspid regurgitation     a. Severe by TEE 2012, mod by echo 2013  . Atrial flutter     a. 05/2012 -> rate controlled, coumadin resumed.    Past Surgical History  Procedure Date  . Coronary artery bypass graft 06/2009    X3, LIMA to LAD, SVG to diagonal, SVG to the posterior descending artery.  . Tonsillectomy   . Knee arthroscopy   . Transthoracic echocardiogram 11/12/2010     Ejection fraction felt to be around 50%.  Wall thickness was increased in a pattern of mild LVH.  Moderately dilated left atrium.  Mildly dilated right atrium. Right ventricle was mildly dilated with mildly reduced systolic function  . Cardiac catheterization 06/09/2009    inferior wall hypokinesia with ejection fraction of 55%.  . Abdominal  hysterectomy   . Joint replacement   . Tee without cardioversion 05/23/2011    Procedure: TRANSESOPHAGEAL ECHOCARDIOGRAM (TEE);  Surgeon: Lewayne Bunting, MD;  Location[facility] ENDOSCOPY;  Service: Cardiovascular;  Laterality: N/A;  . Incision and drainage abscess 05/07/2012    Procedure: INCISION AND DRAINAGE ABSCESS;  Surgeon: Ardeth Sportsman, MD;  Location: [facility] OR;  Service: General;  Laterality: N/A;  Groin wound    History   Social History  . Marital Status: Married    Spouse Name: N/A    Number of Children: 2  . Years of Education: N/A   Occupational History  . teacher    Social History Main Topics  . Smoking status: Former  Games developer  . Smokeless tobacco: Former Neurosurgeon    Quit date: 07/04/1972  . Alcohol Use: No  . Drug Use: No  . Sexually Active: No   Other Topics Concern  . Not on file   Social History Narrative   Lives in [place] with spouse    Family History  Problem Relation Age of Onset  . Colon cancer Mother   . Stroke Mother   . Hypertension Mother   . Cancer Mother     breast  . Cancer Father     colon  . Heart attack Father     x2    Current Outpatient Prescriptions  Medication Sig Dispense Refill  . amitriptyline (ELAVIL) 25 MG tablet Take 75 mg by mouth at bedtime.        Marland Kitchen aspirin EC 81 MG tablet Take 81 mg by mouth daily.      Marland Kitchen atorvastatin (LIPITOR) 40 MG tablet Take 40 mg by mouth every evening.       . diltiazem (CARDIZEM CD) 240 MG 24 hr capsule Take 1 capsule (240 mg total) by mouth daily.  30 capsule  6  . donepezil (ARICEPT) 5 MG tablet Take 5 mg by mouth at bedtime.      Marland Kitchen esomeprazole (NEXIUM) 40 MG capsule Take 40 mg by mouth daily before breakfast.        . furosemide (LASIX) 40 MG tablet Take 1 tablet (40 mg total) by mouth 2 (two) times daily.  60 tablet  6  . glimepiride (AMARYL) 4 MG tablet Take 0.5 tablets (2 mg total) by mouth 2 (two) times daily.      . insulin glargine (LANTUS) 100 UNIT/ML injection Inject 25 Units into the skin 2 (two) times daily.  10 mL    . levothyroxine (SYNTHROID, LEVOTHROID) 100 MCG tablet Take 100 mcg by mouth daily.      . metoprolol succinate (TOPROL-XL) 50 MG 24 hr tablet Take 1 tablet (50 mg total) by mouth 2 (two) times daily. Take with or immediately following a meal.  60 tablet  6  . nitroGLYCERIN (NITROSTAT) 0.4 MG SL tablet Place 1 tablet (0.4 mg total) under the tongue every 5 (five) minutes x 3 doses as needed for chest pain.  25 tablet  3  . potassium chloride SA (K-DUR,KLOR-CON) 20 MEQ tablet Take 1 tablet (20 mEq total) by mouth daily.  30 tablet  6  . warfarin (COUMADIN) 5 MG tablet Take 5 mg by mouth daily. 5 mg po tues,  thurs, sat  7.5 mg po sun, mon, wed      . [DISCONTINUED] enoxaparin (LOVENOX) 100 MG/ML SOLN Inject into the skin every 12 (twelve) hours.        . [DISCONTINUED] simvastatin (ZOCOR) 80 MG tablet Take 80 mg  by mouth at bedtime.           Allergies  Allergen Reactions  . Betadine (Povidone Iodine) Itching  . Capoten (Captopril) Other (Alexander Comments)    unknown  . Penicillins Swelling  . Propoxyphene And Methadone     Intolerance to darvocet    BP 138/82  Pulse 84  Resp 18  Ht 5\' 3"  (1.6 m)  Wt 211 lb (95.709 kg)  BMI 37.38 kg/m2  Ct Pelvis W Contrast  05/03/2012  *RADIOLOGY REPORT*  Clinical Data:  Right groin cellulitis. Tenderness to touch. Warmth.  CT PELVIS WITH CONTRAST  Technique:  Multidetector CT imaging of the pelvis was performed using the standard protocol following the bolus administration of intravenous contrast.  Contrast: 80mL OMNIPAQUE IOHEXOL 300 MG/ML  SOLN  Comparison:   None.  Findings:  There is soft tissue stranding in the subcutaneous fat of the anterior aspect of the proximal right thigh.  There is no abscess.  No significant adenopathy in the right inguinal region. The area of maximum involvement is approximately 6 cm in diameter.  The underlying muscle structures and bones are normal.  Note is made of numerous diverticula in the colon.  Uterus and ovaries been removed.  Incidental note is made of multiple tiny stones in the gallbladder.  Minimal arthritic changes at both hips.  IMPRESSION: Focal slight cellulitis of the subcutaneous fat of the anterior aspect of the proximal right thigh.  No abscess or adenopathy.  Air is seen in the soft tissues of the anterior aspect of both thighs, probably related to subcutaneous injections.   Original Report Authenticated By: Francene Boyers, M.D.    Dg Chest Port 1 View  05/04/2012  *RADIOLOGY REPORT*  Clinical Data: 78 year old female fever and cough.  PORTABLE CHEST - 1 VIEW  Comparison: 04/06/2011 and earlier.  Findings:  Portable upright AP view 1529 hours. Stable cardiomegaly and mediastinal contours.  No pulmonary edema.  No pleural effusion.  No consolidation or confluent pulmonary opacity.  No pneumothorax.  IMPRESSION: Stable cardiomegaly.  No acute cardiopulmonary abnormality.   Original Report Authenticated By: Erskine Speed, M.D.      Review of Systems  Constitutional: Negative for fever, chills, diaphoresis, appetite change and fatigue.  HENT: Negative for ear pain, sore throat and trouble swallowing.   Eyes: Negative for photophobia and visual disturbance.  Respiratory: Negative for cough and choking.   Cardiovascular: Negative for chest pain and palpitations.  Gastrointestinal: Negative for nausea, vomiting, abdominal pain, diarrhea, constipation, anal bleeding and rectal pain.  Genitourinary: Negative for dysuria, frequency and difficulty urinating.  Musculoskeletal: Positive for myalgias and arthralgias. Negative for gait problem.  Skin: Positive for wound. Negative for color change, pallor and rash.  Neurological: Negative for dizziness, speech difficulty, weakness and numbness.  Hematological: Negative for adenopathy.  Psychiatric/Behavioral: Negative for confusion and agitation. The patient is not nervous/anxious.        Objective:   Physical Exam  Constitutional: She is oriented to person, place, and time. She appears well-developed and well-nourished. No distress.  HENT:  Head: Normocephalic.  Mouth/Throat: Oropharynx is clear and moist. No oropharyngeal exudate.  Eyes: Conjunctivae normal and EOM are normal. Pupils are equal, round, and reactive to light. No scleral icterus.  Neck: Normal range of motion. No tracheal deviation present.  Cardiovascular: Normal rate and intact distal pulses.   Pulmonary/Chest: Effort normal. No respiratory distress. She exhibits no tenderness.  Abdominal: Soft. She exhibits no distension. There is no tenderness. No hernia.  Hernia confirmed negative in the  right inguinal area and confirmed negative in the left inguinal area.         Obese but soft.  No hernias  Genitourinary: No vaginal discharge found.  Musculoskeletal: Normal range of motion. She exhibits no tenderness.  Lymphadenopathy:       Right: No inguinal adenopathy present.       Left: No inguinal adenopathy present.  Neurological: She is alert and oriented to person, place, and time. No cranial nerve deficit. She exhibits normal muscle tone. Coordination normal.  Skin: Skin is warm and dry. No rash noted. She is not diaphoretic.  Psychiatric: She has a normal mood and affect. Her behavior is normal.       Assessment:     Status post incision and drainage of deep proximal right thigh abscess.  Nearly closed   Plan:     Increase activity as tolerated to regular activity.  Do not push through pain.  Continue dressing change once a day.  Transition to Band Aid or gauze/tape.  D/C wound VAC   Diet as tolerated. Bowel regimen to avoid problems.  Return to clinic 1 month - by then the wound should be closed.   Instructions discussed.  Followup with primary care physician for other health issues as would normally be done.  Questions answered.  The patient expressed understanding and appreciation

## 2012-06-04 NOTE — Patient Instructions (Signed)
A full discussion of the nature of anticoagulants has been carried out.  A benefit risk analysis has been presented to the patient, so that they understand the justification for choosing anticoagulation at this time. The need for frequent and regular monitoring, precise dosage adjustment and compliance is stressed.  Side effects of potential bleeding are discussed.  The patient should avoid any OTC items containing aspirin or ibuprofen, and should avoid great swings in general diet.  Avoid alcohol consumption.  Call if any signs of abnormal bleeding.  Phone number [phone].  

## 2012-06-07 ENCOUNTER — Ambulatory Visit (HOSPITAL_COMMUNITY): Payer: Medicare Other | Attending: Cardiology | Admitting: Radiology

## 2012-06-07 VITALS — BP 93/67 | Ht 63.0 in | Wt 207.0 lb

## 2012-06-07 DIAGNOSIS — I4892 Unspecified atrial flutter: Secondary | ICD-10-CM | POA: Insufficient documentation

## 2012-06-07 DIAGNOSIS — I509 Heart failure, unspecified: Secondary | ICD-10-CM | POA: Insufficient documentation

## 2012-06-07 DIAGNOSIS — Q789 Osteochondrodysplasia, unspecified: Secondary | ICD-10-CM

## 2012-06-07 DIAGNOSIS — I2510 Atherosclerotic heart disease of native coronary artery without angina pectoris: Secondary | ICD-10-CM

## 2012-06-07 DIAGNOSIS — R002 Palpitations: Secondary | ICD-10-CM | POA: Insufficient documentation

## 2012-06-07 DIAGNOSIS — R5381 Other malaise: Secondary | ICD-10-CM | POA: Insufficient documentation

## 2012-06-07 DIAGNOSIS — R5383 Other fatigue: Secondary | ICD-10-CM | POA: Insufficient documentation

## 2012-06-07 DIAGNOSIS — E119 Type 2 diabetes mellitus without complications: Secondary | ICD-10-CM | POA: Insufficient documentation

## 2012-06-07 DIAGNOSIS — I4891 Unspecified atrial fibrillation: Secondary | ICD-10-CM | POA: Insufficient documentation

## 2012-06-07 DIAGNOSIS — I447 Left bundle-branch block, unspecified: Secondary | ICD-10-CM

## 2012-06-07 DIAGNOSIS — Z87891 Personal history of nicotine dependence: Secondary | ICD-10-CM | POA: Insufficient documentation

## 2012-06-07 DIAGNOSIS — J449 Chronic obstructive pulmonary disease, unspecified: Secondary | ICD-10-CM | POA: Insufficient documentation

## 2012-06-07 DIAGNOSIS — J4489 Other specified chronic obstructive pulmonary disease: Secondary | ICD-10-CM | POA: Insufficient documentation

## 2012-06-07 DIAGNOSIS — E785 Hyperlipidemia, unspecified: Secondary | ICD-10-CM | POA: Insufficient documentation

## 2012-06-07 DIAGNOSIS — Z8673 Personal history of transient ischemic attack (TIA), and cerebral infarction without residual deficits: Secondary | ICD-10-CM | POA: Insufficient documentation

## 2012-06-07 DIAGNOSIS — Z794 Long term (current) use of insulin: Secondary | ICD-10-CM | POA: Insufficient documentation

## 2012-06-07 DIAGNOSIS — R000 Tachycardia, unspecified: Secondary | ICD-10-CM | POA: Insufficient documentation

## 2012-06-07 DIAGNOSIS — Z951 Presence of aortocoronary bypass graft: Secondary | ICD-10-CM | POA: Insufficient documentation

## 2012-06-07 DIAGNOSIS — Z8249 Family history of ischemic heart disease and other diseases of the circulatory system: Secondary | ICD-10-CM | POA: Insufficient documentation

## 2012-06-07 DIAGNOSIS — R0602 Shortness of breath: Secondary | ICD-10-CM | POA: Insufficient documentation

## 2012-06-07 DIAGNOSIS — I10 Essential (primary) hypertension: Secondary | ICD-10-CM | POA: Insufficient documentation

## 2012-06-07 MED ORDER — TECHNETIUM TC 99M SESTAMIBI GENERIC - CARDIOLITE
33.0000 | Freq: Once | INTRAVENOUS | Status: AC | PRN
  Administered 2012-06-07: 33 via INTRAVENOUS

## 2012-06-07 MED ORDER — REGADENOSON 0.4 MG/5ML IV SOLN
0.4000 mg | Freq: Once | INTRAVENOUS | Status: AC
  Administered 2012-06-07: 0.4 mg via INTRAVENOUS

## 2012-06-07 MED ORDER — TECHNETIUM TC 99M SESTAMIBI GENERIC - CARDIOLITE
11.0000 | Freq: Once | INTRAVENOUS | Status: AC | PRN
  Administered 2012-06-07: 11 via INTRAVENOUS

## 2012-06-07 MED ORDER — AMINOPHYLLINE 25 MG/ML IV SOLN
75.0000 mg | Freq: Once | INTRAVENOUS | Status: AC
  Administered 2012-06-07: 75 mg via INTRAVENOUS

## 2012-06-07 NOTE — Progress Notes (Signed)
[facility] SITE 3 NUCLEAR MED [address] [id number] [place] [address] [phone]  Cardiology Nuclear Med Study  [name] is a 78 y.o. female     MRN : [id number]     DOB: 1932/09/01  Procedure Date: 06/07/2012  Nuclear Med Background Indication for Stress Test:  Evaluation for Ischemia, Graft Patency, and Patient seen in hospital on 05-27-12 for AFIB/Atrial Flutter with RVR,  enzymes mildly increase troponin ? Secondary to AFIB/RVR History:  COPD and AFIB, CHF, 2010 Heart Cath: multivessel dz CABG x3 inf wall hypokinesis,  05/28/12 ECHO: EF: 30% diffuse hypokinesis Cardiac Risk Factors: CVA, Family History - CAD, History of Smoking, Hypertension, IDDM Type 2 and Lipids  Symptoms:  Fatigue with Exertion, Palpitations, Rapid HR and SOB   Nuclear Pre-Procedure Caffeine/Decaff Intake:  None> 12 hrs NPO After: 6:30pm   Lungs:  clear O2 Sat: 95% on room air. IV 0.9% NS with Angio Cath:  24g  IV Site: L Hand, tolerated well IV Started by:  Irean Hong, RN  Chest Size (in):  40 Cup Size: B  Height: 5\' 3"  (1.6 m)  Weight:  207 lb (93.895 kg)  BMI:  Body mass index is 36.67 kg/(m^2). Tech Comments:  FBS was 309 at 10:25 am, no insulin or amaryl today, no medications today    Nuclear Med Study 1 or 2 day study: 1 day  Stress Test Type:  Lexiscan  Reading MD: Natasha Millers, MD  Order Authorizing Provider:  Peter Swaziland, MD  Resting Radionuclide: Technetium 80m Sestamibi  Resting Radionuclide Dose: 11.0 mCi   Stress Radionuclide:  Technetium 48m Sestamibi  Stress Radionuclide Dose: 33.0 mCi           Stress Protocol Rest HR: 105 Stress HR: 155  Rest BP: 93/67 Stress BP: 106/71  Exercise Time (min): n/a METS: n/a   Predicted Max HR: 143 bpm % Max HR: 108.39 bpm Rate Pressure Product: 62952   Dose of Adenosine (mg):  n/a Dose of Lexiscan: 0.4 mg  Dose of Atropine (mg): n/a Dose of Dobutamine: n/a mcg/kg/min (at max HR)  Stress Test  Technologist: Milana Na, EMT-P  Nuclear Technologist:  Domenic Polite, CNMT     Rest Procedure:  Myocardial perfusion imaging was performed at rest 45 minutes following the intravenous administration of Technetium 68m Sestamibi. Rest ECG: Ectopic atrial rhythm with pacs, RBBB  Stress Procedure:  The patient received IV Lexiscan 0.4 mg over 15-seconds.  Technetium 35m Sestamibi injected at 30-seconds.  There were no significant changes, +sob, headache, chest discomfort, and increased HR with Lexiscan.  The patient was given Aminophylline 75 mg IV for symptoms and an extreme increase in HR. The patient also took her Metoprolol XR 50 mg po dose. Quantitative spect images were obtained after a 45 minute delay. Stress ECG: No significant ST segment change suggestive of ischemia.  QPS Raw Data Images:  Acquisition technically good; LVE. Stress Images:  There is decreased uptake in the lateral wall. Rest Images:  Normal homogeneous uptake in all areas of the myocardium. Subtraction (SDS):  These findings are consistent with ischemia. Transient Ischemic Dilatation (Normal <1.22):  1.02 Lung/Heart Ratio (Normal <0.45):  0.13  Quantitative Gated Spect Images QGS EDV:  n/a ml QGS ESV:  n/a ml  Impression Exercise Capacity:  Lexiscan with no exercise. BP Response:  Normal blood pressure response. Clinical Symptoms:  There is chest pain and dyspnea. ECG Impression:  No significant ST segment change suggestive of ischemia; transient SVT  with infusion. Comparison with Prior Nuclear Study: No images to compare  Overall Impression:  Low risk stress nuclear study with a small, mild, reversible defect in the lateral wall suggestive of mild ischemia.  LV Ejection Fraction: Study not gated.    [name]

## 2012-06-11 ENCOUNTER — Encounter: Payer: Self-pay | Admitting: Nurse Practitioner

## 2012-06-11 ENCOUNTER — Ambulatory Visit (INDEPENDENT_AMBULATORY_CARE_PROVIDER_SITE_OTHER): Payer: Medicare Other | Admitting: Nurse Practitioner

## 2012-06-11 ENCOUNTER — Ambulatory Visit (INDEPENDENT_AMBULATORY_CARE_PROVIDER_SITE_OTHER): Payer: Medicare Other | Admitting: *Deleted

## 2012-06-11 VITALS — BP 122/64 | HR 54 | Ht 63.0 in | Wt 209.4 lb

## 2012-06-11 DIAGNOSIS — Z7901 Long term (current) use of anticoagulants: Secondary | ICD-10-CM

## 2012-06-11 DIAGNOSIS — I4891 Unspecified atrial fibrillation: Secondary | ICD-10-CM

## 2012-06-11 DIAGNOSIS — I5021 Acute systolic (congestive) heart failure: Secondary | ICD-10-CM

## 2012-06-11 LAB — CBC WITH DIFFERENTIAL/PLATELET
Basophils Absolute: 0 10*3/uL (ref 0.0–0.1)
Basophils Relative: 0.2 % (ref 0.0–3.0)
Eosinophils Absolute: 0.3 10*3/uL (ref 0.0–0.7)
Eosinophils Relative: 3.9 % (ref 0.0–5.0)
HCT: 33.5 % — ABNORMAL LOW (ref 36.0–46.0)
Hemoglobin: 10.7 g/dL — ABNORMAL LOW (ref 12.0–15.0)
Lymphocytes Relative: 25.5 % (ref 12.0–46.0)
Lymphs Abs: 1.7 10*3/uL (ref 0.7–4.0)
MCHC: 32 g/dL (ref 30.0–36.0)
MCV: 92.1 fl (ref 78.0–100.0)
Monocytes Absolute: 0.6 10*3/uL (ref 0.1–1.0)
Monocytes Relative: 8.7 % (ref 3.0–12.0)
Neutro Abs: 4.2 10*3/uL (ref 1.4–7.7)
Neutrophils Relative %: 61.7 % (ref 43.0–77.0)
Platelets: 219 10*3/uL (ref 150.0–400.0)
RBC: 3.63 Mil/uL — ABNORMAL LOW (ref 3.87–5.11)
RDW: 15.2 % — ABNORMAL HIGH (ref 11.5–14.6)
WBC: 6.8 10*3/uL (ref 4.5–10.5)

## 2012-06-11 LAB — BASIC METABOLIC PANEL
BUN: 37 mg/dL — ABNORMAL HIGH (ref 6–23)
CO2: 29 mEq/L (ref 19–32)
Calcium: 8.9 mg/dL (ref 8.4–10.5)
Chloride: 93 mEq/L — ABNORMAL LOW (ref 96–112)
Creatinine, Ser: 1.8 mg/dL — ABNORMAL HIGH (ref 0.4–1.2)
GFR: 34.8 mL/min — ABNORMAL LOW (ref >60.00)
Glucose, Bld: 373 mg/dL — ABNORMAL HIGH (ref 70–99)
Potassium: 3.7 mEq/L (ref 3.5–5.1)
Sodium: 131 mEq/L — ABNORMAL LOW (ref 135–145)

## 2012-06-11 NOTE — Patient Instructions (Addendum)
Stay on your current medicines  We need to check labs today   See Dr. Swaziland in about 3 weeks  Avoid salt  Call the [facility] office at [phone] if you have any questions, problems or concerns.

## 2012-06-11 NOTE — Progress Notes (Signed)
[name] Date of Birth: 1933-01-03 Medical Record #[id number]  History of Present Illness: [name] is seen back today for a post hospital visit. She is seen for Dr. Swaziland. She has multiple and quite complex issues. She has had PAF and has failed on amiodarone therapy due to prolonged QT (also prevents her from being on Tikosyn) and not a candidate for either IC's due to her CAD, multaq due to her low EF or ablation secondary to severe TR. She has had prior GI bleeding earlier this year from coumadin. Other issues include chronic systolic heart failure with an EF now down to 30%, recurrent atrial fib, reinitiation of coumadin, CKD stage 3, uncontrolled DM and dementia. With this most recent admission, she had an elevated troponin, felt to be due to demand ischemia. She has had follow up Myoview testing with results noted below.   She comes in today. She is here with her husband. She still notes some fluttering of her heart. No chest pain. Less swelling. Trying to avoid salt. Weight is down. Not lightheaded or dizzy. No syncope.   Current Outpatient Prescriptions on File Prior to Visit  Medication Sig Dispense Refill  . amitriptyline (ELAVIL) 25 MG tablet Take 75 mg by mouth at bedtime.        Marland Kitchen aspirin EC 81 MG tablet Take 81 mg by mouth daily.      Marland Kitchen atorvastatin (LIPITOR) 40 MG tablet Take 40 mg by mouth every evening.       . diltiazem (CARDIZEM CD) 240 MG 24 hr capsule Take 1 capsule (240 mg total) by mouth daily.  30 capsule  6  . donepezil (ARICEPT) 5 MG tablet Take 5 mg by mouth at bedtime.      Marland Kitchen esomeprazole (NEXIUM) 40 MG capsule Take 40 mg by mouth daily before breakfast.        . furosemide (LASIX) 40 MG tablet Take 1 tablet (40 mg total) by mouth 2 (two) times daily.  60 tablet  6  . glimepiride (AMARYL) 4 MG tablet Take 0.5 tablets (2 mg total) by mouth 2 (two) times daily.      . insulin glargine (LANTUS) 100 UNIT/ML injection Inject 25 Units into the skin 2 (two) times  daily.  10 mL    . levothyroxine (SYNTHROID, LEVOTHROID) 100 MCG tablet Take 100 mcg by mouth daily.      . nitroGLYCERIN (NITROSTAT) 0.4 MG SL tablet Place 1 tablet (0.4 mg total) under the tongue every 5 (five) minutes x 3 doses as needed for chest pain.  25 tablet  3  . potassium chloride SA (K-DUR,KLOR-CON) 20 MEQ tablet Take 1 tablet (20 mEq total) by mouth daily.  30 tablet  6  . warfarin (COUMADIN) 5 MG tablet Take 5 mg by mouth daily. 5 mg po tues, thurs, sat  7.5 mg po sun, mon, wed      . [DISCONTINUED] metoprolol succinate (TOPROL-XL) 50 MG 24 hr tablet Take 1 tablet (50 mg total) by mouth 2 (two) times daily. Take with or immediately following a meal.  60 tablet  6  . [DISCONTINUED] enoxaparin (LOVENOX) 100 MG/ML SOLN Inject into the skin every 12 (twelve) hours.        . [DISCONTINUED] simvastatin (ZOCOR) 80 MG tablet Take 80 mg by mouth at bedtime.          Allergies  Allergen Reactions  . Betadine (Povidone Iodine) Itching  . Capoten (Captopril) Other (Alexander Comments)    unknown  .  Methadone   . Penicillins Swelling  . Propoxyphene And Methadone     Intolerance to darvocet    Past Medical History  Diagnosis Date  . Coronary artery disease     a. Severe two vessel; s/p CABG;  b. 05/2012 NSTEMI in setting of rapid aflutter  . Hypertension   . Diabetes mellitus     Type 2  . Hyperlipidemia   . Diabetic neuropathy   . PAF (paroxysmal atrial fibrillation)     a. Failed medical therapy with amiodarone; not a candidate for class IC meds due to her CAD. No Multaq due to CHF hx; QT prolonged with amiodarone; Not able to be ablated due to valve disease  . CVA (cerebral vascular accident) 2004  . Hypothyroidism   . Chronic anemia   . Diastolic dysfunction     per echo in May of 2012  . GERD (gastroesophageal reflux disease)   . Arthritis   . Memory loss, short term   . COPD (chronic obstructive pulmonary disease)   . Blood transfusion without reported diagnosis   . Chronic  combined systolic and diastolic CHF (congestive heart failure)     a. 05/2012 Echo: EF 30%, diff HK, Mild AS, MR, Mod TR, PASP .  . Moderate mitral regurgitation     a. mod by TEE 2012, mild by echo 2013  . Severe tricuspid regurgitation     a. Severe by TEE 2012, mod by echo 2013  . Atrial flutter     a. 05/2012 -> rate controlled, coumadin resumed.    Past Surgical History  Procedure Date  . Coronary artery bypass graft 06/2009    X3, LIMA to LAD, SVG to diagonal, SVG to the posterior descending artery.  . Tonsillectomy   . Knee arthroscopy   . Transthoracic echocardiogram 11/12/2010     Ejection fraction felt to be around 50%.  Wall thickness was increased in a pattern of mild LVH.  Moderately dilated left atrium.  Mildly dilated right atrium. Right ventricle was mildly dilated with mildly reduced systolic function  . Cardiac catheterization 06/09/2009    inferior wall hypokinesia with ejection fraction of 55%.  . Abdominal hysterectomy   . Joint replacement   . Tee without cardioversion 05/23/2011    Procedure: TRANSESOPHAGEAL ECHOCARDIOGRAM (TEE);  Surgeon: Lewayne Bunting, MD;  Location: [facility] ENDOSCOPY;  Service: Cardiovascular;  Laterality: N/A;  . Incision and drainage abscess 05/07/2012    Procedure: INCISION AND DRAINAGE ABSCESS;  Surgeon: Ardeth Sportsman, MD;  Location: [facility] OR;  Service: General;  Laterality: N/A;  Groin wound    History  Smoking status  . Former Smoker  Smokeless tobacco  . Former Neurosurgeon  . Quit date: 07/04/1972    History  Alcohol Use No    Family History  Problem Relation Age of Onset  . Colon cancer Mother   . Stroke Mother   . Hypertension Mother   . Cancer Mother     breast  . Cancer Father     colon  . Heart attack Father     x2    Review of Systems: The review of systems is per the HPI.  All other systems were reviewed and are negative.  Physical Exam: BP 122/64  Pulse 54  Ht 5\' 3"  (1.6 m)  Wt 209 lb 6.4 oz (94.983 kg)   BMI 37.09 kg/m2 Patient is very pleasant and in no acute distress. She is obese. Skin is warm and dry. Color is normal.  HEENT  is unremarkable. Normocephalic/atraumatic. PERRL. Sclera are nonicteric. Neck is supple. No masses. No JVD. Lungs are clear. Cardiac exam shows an irregular rhythm. She has fair rate control. Abdomen is soft. Extremities are full with trace edema. Gait and ROM are intact. No gross neurologic deficits noted.   LABORATORY DATA: EKG today shows atrial fib. R BBB. Rate of 104  BMET and CBC pending.   Lab Results  Component Value Date   WBC 6.8 05/30/2012   HGB 9.5* 05/30/2012   HCT 29.1* 05/30/2012   PLT 237 05/30/2012   GLUCOSE 116* 05/30/2012   CHOL 160 05/26/2012   TRIG 80 05/26/2012   HDL 29* 05/26/2012   LDLCALC 115* 05/26/2012   ALT 16 05/02/2012   AST 22 05/02/2012   NA 138 05/30/2012   K 3.8 05/30/2012   CL 100 05/30/2012   CREATININE 1.72* 05/30/2012   BUN 29* 05/30/2012   CO2 28 05/30/2012   TSH 0.722 05/26/2012   INR 1.9 06/11/2012   HGBA1C 10.6* 05/26/2012    Myoview Impression  Exercise Capacity: Lexiscan with no exercise.  BP Response: Normal blood pressure response.  Clinical Symptoms: There is chest pain and dyspnea.  ECG Impression: No significant ST segment change suggestive of ischemia; transient SVT with infusion.  Comparison with Prior Nuclear Study: No images to compare  Overall Impression: Low risk stress nuclear study with a small, mild, reversible defect in the lateral wall suggestive of mild ischemia.  LV Ejection Fraction: Study not gated.   Olga Millers   Echo Study Conclusions  - Left ventricle: The cavity size was mildly dilated. Wall thickness was normal. The estimated ejection fraction was 30%. Diffuse hypokinesis. - Aortic valve: There was mild stenosis. - Mitral valve: Calcified annulus. Mild regurgitation. - Left atrium: The atrium was mildly dilated. - Right atrium: The atrium was moderately dilated. -  Atrial septum: No defect or patent foramen ovale was identified. - Tricuspid valve: Moderate regurgitation. - Pulmonary arteries: PA peak pressure: 34mm Hg (S).  Assessment / Plan: 1. Recurrent atrial fib - back on coumadin. Has fair rate control. Her only real option is rate control with anticoagulation  2. Worsening systolic heart failure - EF down to 30%. She is on CCB therapy for her rate control and our options are limited. Not a candidate for ACE given her renal disease. May need to consider nitrate/hydralazine therapy.   3. Coumadin therapy - will need close monitoring with goal 2 to 2.5  4. Prior GI bleed - will recheck CBC today  5. CAD - prior CABG - low risk Myoview with small, mild reversible lateral wall defect suggestive of ischemia. Would favor medical management given her multiple comorbidities.  6. CKD - stage 3. Will recheck labs today.   We will check labs today. Will ask Dr. Swaziland to review and he will Alexander her back in about 3 weeks. I think medical management is really our best and perhaps only option.  Patient is agreeable to this plan and will call if any problems develop in the interim.

## 2012-06-14 ENCOUNTER — Other Ambulatory Visit: Payer: Medicare Other | Admitting: Lab

## 2012-06-14 ENCOUNTER — Ambulatory Visit: Payer: Medicare Other

## 2012-06-15 ENCOUNTER — Telehealth: Payer: Self-pay | Admitting: Cardiology

## 2012-06-15 NOTE — Telephone Encounter (Signed)
Pt will call her insurance company regarding coverage and let us know if it is covered.

## 2012-06-15 NOTE — Telephone Encounter (Signed)
Pt has low endurance when walking and her physical therapist is wondering if [facility] cardiac rehab would be an option for her?

## 2012-06-15 NOTE — Telephone Encounter (Signed)
OK to refer to cardiac Rehab. Will need to see if insurance will cover.  Peter Swaziland MD, [facility]

## 2012-06-15 NOTE — Telephone Encounter (Signed)
Will route to Elnita Maxwell, Dr Elvis Coil nurse for her information in case Mrs Hove calls back next week.

## 2012-06-15 NOTE — Telephone Encounter (Signed)
New problem:   Need a referral to Cardiac Rehab.

## 2012-06-18 ENCOUNTER — Ambulatory Visit (INDEPENDENT_AMBULATORY_CARE_PROVIDER_SITE_OTHER): Payer: Medicare Other | Admitting: *Deleted

## 2012-06-18 DIAGNOSIS — I4891 Unspecified atrial fibrillation: Secondary | ICD-10-CM

## 2012-06-18 DIAGNOSIS — Z7901 Long term (current) use of anticoagulants: Secondary | ICD-10-CM

## 2012-06-19 ENCOUNTER — Telehealth: Payer: Self-pay | Admitting: Cardiology

## 2012-06-19 NOTE — Telephone Encounter (Signed)
New problem:    Status of cardiac rehab referral.  Speak with Jonny Ruiz - [facility]

## 2012-06-21 ENCOUNTER — Other Ambulatory Visit: Payer: Self-pay | Admitting: Lab

## 2012-06-21 ENCOUNTER — Ambulatory Visit: Payer: BC Managed Care – PPO | Admitting: Oncology

## 2012-06-21 ENCOUNTER — Ambulatory Visit: Payer: BC Managed Care – PPO

## 2012-06-21 NOTE — Telephone Encounter (Signed)
Spoke to patient's husband 06/19/12 was told ok with Dr.Jordan for patient to go to cardiac rehab.Advised insurance may not approve.Husband stated she will probably not go.

## 2012-06-22 ENCOUNTER — Other Ambulatory Visit (HOSPITAL_BASED_OUTPATIENT_CLINIC_OR_DEPARTMENT_OTHER): Payer: TRICARE For Life (TFL) | Admitting: Lab

## 2012-06-22 ENCOUNTER — Ambulatory Visit (HOSPITAL_BASED_OUTPATIENT_CLINIC_OR_DEPARTMENT_OTHER): Payer: Medicare Other

## 2012-06-22 VITALS — BP 123/85 | HR 104 | Temp 96.9°F

## 2012-06-22 DIAGNOSIS — N039 Chronic nephritic syndrome with unspecified morphologic changes: Secondary | ICD-10-CM

## 2012-06-22 DIAGNOSIS — N189 Chronic kidney disease, unspecified: Secondary | ICD-10-CM

## 2012-06-22 DIAGNOSIS — D631 Anemia in chronic kidney disease: Secondary | ICD-10-CM

## 2012-06-22 LAB — CBC WITH DIFFERENTIAL/PLATELET
BASO%: 0.3 % (ref 0.0–2.0)
EOS%: 1.8 % (ref 0.0–7.0)
HCT: 32.4 % — ABNORMAL LOW (ref 34.8–46.6)
LYMPH%: 28.6 % (ref 14.0–49.7)
MCH: 29.3 pg (ref 25.1–34.0)
MCHC: 32.4 g/dL (ref 31.5–36.0)
MONO%: 12 % (ref 0.0–14.0)
NEUT%: 57.3 % (ref 38.4–76.8)
Platelets: 205 10*3/uL (ref 145–400)

## 2012-06-22 MED ORDER — DARBEPOETIN ALFA-POLYSORBATE 200 MCG/0.4ML IJ SOLN
200.0000 ug | Freq: Once | INTRAMUSCULAR | Status: AC
  Administered 2012-06-22: 200 ug via SUBCUTANEOUS
  Filled 2012-06-22: qty 0.4

## 2012-06-25 ENCOUNTER — Ambulatory Visit (INDEPENDENT_AMBULATORY_CARE_PROVIDER_SITE_OTHER): Payer: Medicare Other

## 2012-06-25 DIAGNOSIS — Z7901 Long term (current) use of anticoagulants: Secondary | ICD-10-CM

## 2012-06-25 DIAGNOSIS — I4891 Unspecified atrial fibrillation: Secondary | ICD-10-CM

## 2012-06-29 ENCOUNTER — Telehealth: Payer: Self-pay | Admitting: Cardiology

## 2012-06-29 ENCOUNTER — Encounter: Payer: Self-pay | Admitting: Nurse Practitioner

## 2012-06-29 NOTE — Telephone Encounter (Signed)
New problem:   1. C/O rapid heart rate. Dizzy, unstable balance. No blood pressure taken. Took am med's.    2. Husband is very upset as to why his wife never see Dr. Swaziland . Patient husband is aware of the appt on 1/8. But insisted since she coming into the office on 12/30 can she be seen if possible. He is aware that Dr. Swaziland is on Vacation next week.  Explain the situation as to why patient may not be able to see the physician.  Husband is aware message will be sent around to triage nurse.

## 2012-06-29 NOTE — Telephone Encounter (Addendum)
**Note De-Identified  Obfuscation** Pt's husband states that pt. was off balance and her HR was elevated this am. He does not know what her HR was but insists that it was rapid.  Pt's husband asked the pt., while on the phone with me, how she was feeling she stated I feel ok and I am not dizzy I just got off balance".  I offered to schedule a sooner appt. with one of our extenders but pt's husband refused because he states the pt. needs to be seen by Dr. Swaziland. Pt's husband is advised to either drive pt. to Urgent care or ER if able and if not to call 911 if pt's condition worsens, he verbalized understanding.

## 2012-07-02 ENCOUNTER — Ambulatory Visit (INDEPENDENT_AMBULATORY_CARE_PROVIDER_SITE_OTHER): Payer: Medicare Other | Admitting: Pharmacist

## 2012-07-02 DIAGNOSIS — I4891 Unspecified atrial fibrillation: Secondary | ICD-10-CM

## 2012-07-02 DIAGNOSIS — Z7901 Long term (current) use of anticoagulants: Secondary | ICD-10-CM

## 2012-07-09 ENCOUNTER — Encounter (INDEPENDENT_AMBULATORY_CARE_PROVIDER_SITE_OTHER): Payer: Medicare Other | Admitting: Surgery

## 2012-07-11 ENCOUNTER — Encounter (HOSPITAL_COMMUNITY): Payer: Self-pay | Admitting: *Deleted

## 2012-07-11 ENCOUNTER — Inpatient Hospital Stay (HOSPITAL_COMMUNITY)
Admission: AD | Admit: 2012-07-11 | Discharge: 2012-07-26 | DRG: 242 | Disposition: A | Discharge status: Home / Self Care | Payer: Medicare Other | Source: Ambulatory Visit | Attending: Cardiology | Admitting: Cardiology

## 2012-07-11 ENCOUNTER — Ambulatory Visit (INDEPENDENT_AMBULATORY_CARE_PROVIDER_SITE_OTHER): Payer: Medicare Other | Admitting: Cardiology

## 2012-07-11 ENCOUNTER — Encounter (INDEPENDENT_AMBULATORY_CARE_PROVIDER_SITE_OTHER): Payer: Medicare Other

## 2012-07-11 ENCOUNTER — Encounter: Payer: Self-pay | Admitting: Cardiology

## 2012-07-11 VITALS — BP 120/80 | HR 159 | Ht 63.0 in | Wt 225.8 lb

## 2012-07-11 DIAGNOSIS — I079 Rheumatic tricuspid valve disease, unspecified: Secondary | ICD-10-CM | POA: Diagnosis present

## 2012-07-11 DIAGNOSIS — N183 Chronic kidney disease, stage 3 unspecified: Secondary | ICD-10-CM | POA: Diagnosis present

## 2012-07-11 DIAGNOSIS — R5381 Other malaise: Secondary | ICD-10-CM | POA: Diagnosis present

## 2012-07-11 DIAGNOSIS — I4891 Unspecified atrial fibrillation: Secondary | ICD-10-CM | POA: Diagnosis present

## 2012-07-11 DIAGNOSIS — Z951 Presence of aortocoronary bypass graft: Secondary | ICD-10-CM

## 2012-07-11 DIAGNOSIS — I5043 Acute on chronic combined systolic (congestive) and diastolic (congestive) heart failure: Secondary | ICD-10-CM | POA: Diagnosis present

## 2012-07-11 DIAGNOSIS — Z7901 Long term (current) use of anticoagulants: Secondary | ICD-10-CM

## 2012-07-11 DIAGNOSIS — I4892 Unspecified atrial flutter: Principal | ICD-10-CM | POA: Diagnosis present

## 2012-07-11 DIAGNOSIS — D649 Anemia, unspecified: Secondary | ICD-10-CM | POA: Diagnosis present

## 2012-07-11 DIAGNOSIS — I509 Heart failure, unspecified: Secondary | ICD-10-CM

## 2012-07-11 DIAGNOSIS — E039 Hypothyroidism, unspecified: Secondary | ICD-10-CM | POA: Diagnosis present

## 2012-07-11 DIAGNOSIS — E119 Type 2 diabetes mellitus without complications: Secondary | ICD-10-CM

## 2012-07-11 DIAGNOSIS — I059 Rheumatic mitral valve disease, unspecified: Secondary | ICD-10-CM | POA: Diagnosis present

## 2012-07-11 DIAGNOSIS — E875 Hyperkalemia: Secondary | ICD-10-CM | POA: Diagnosis present

## 2012-07-11 DIAGNOSIS — I2510 Atherosclerotic heart disease of native coronary artery without angina pectoris: Secondary | ICD-10-CM

## 2012-07-11 DIAGNOSIS — E1149 Type 2 diabetes mellitus with other diabetic neurological complication: Secondary | ICD-10-CM | POA: Diagnosis present

## 2012-07-11 DIAGNOSIS — K5900 Constipation, unspecified: Secondary | ICD-10-CM | POA: Diagnosis present

## 2012-07-11 DIAGNOSIS — I471 Supraventricular tachycardia: Secondary | ICD-10-CM

## 2012-07-11 DIAGNOSIS — Z87891 Personal history of nicotine dependence: Secondary | ICD-10-CM

## 2012-07-11 DIAGNOSIS — I129 Hypertensive chronic kidney disease with stage 1 through stage 4 chronic kidney disease, or unspecified chronic kidney disease: Secondary | ICD-10-CM | POA: Diagnosis present

## 2012-07-11 DIAGNOSIS — E1142 Type 2 diabetes mellitus with diabetic polyneuropathy: Secondary | ICD-10-CM | POA: Diagnosis present

## 2012-07-11 DIAGNOSIS — J449 Chronic obstructive pulmonary disease, unspecified: Secondary | ICD-10-CM | POA: Diagnosis present

## 2012-07-11 DIAGNOSIS — Z8673 Personal history of transient ischemic attack (TIA), and cerebral infarction without residual deficits: Secondary | ICD-10-CM

## 2012-07-11 DIAGNOSIS — F0390 Unspecified dementia without behavioral disturbance: Secondary | ICD-10-CM | POA: Diagnosis present

## 2012-07-11 DIAGNOSIS — I5189 Other ill-defined heart diseases: Secondary | ICD-10-CM

## 2012-07-11 DIAGNOSIS — I5023 Acute on chronic systolic (congestive) heart failure: Secondary | ICD-10-CM

## 2012-07-11 DIAGNOSIS — I429 Cardiomyopathy, unspecified: Secondary | ICD-10-CM

## 2012-07-11 DIAGNOSIS — J4489 Other specified chronic obstructive pulmonary disease: Secondary | ICD-10-CM | POA: Diagnosis present

## 2012-07-11 DIAGNOSIS — J90 Pleural effusion, not elsewhere classified: Secondary | ICD-10-CM

## 2012-07-11 DIAGNOSIS — N058 Unspecified nephritic syndrome with other morphologic changes: Secondary | ICD-10-CM | POA: Diagnosis present

## 2012-07-11 DIAGNOSIS — J9600 Acute respiratory failure, unspecified whether with hypoxia or hypercapnia: Secondary | ICD-10-CM | POA: Diagnosis present

## 2012-07-11 DIAGNOSIS — E1129 Type 2 diabetes mellitus with other diabetic kidney complication: Secondary | ICD-10-CM | POA: Diagnosis present

## 2012-07-11 DIAGNOSIS — J811 Chronic pulmonary edema: Secondary | ICD-10-CM

## 2012-07-11 DIAGNOSIS — D631 Anemia in chronic kidney disease: Secondary | ICD-10-CM

## 2012-07-11 DIAGNOSIS — I495 Sick sinus syndrome: Secondary | ICD-10-CM | POA: Diagnosis present

## 2012-07-11 DIAGNOSIS — E785 Hyperlipidemia, unspecified: Secondary | ICD-10-CM | POA: Diagnosis present

## 2012-07-11 DIAGNOSIS — E876 Hypokalemia: Secondary | ICD-10-CM | POA: Diagnosis present

## 2012-07-11 DIAGNOSIS — K219 Gastro-esophageal reflux disease without esophagitis: Secondary | ICD-10-CM | POA: Diagnosis present

## 2012-07-11 LAB — CBC WITH DIFFERENTIAL/PLATELET
Basophils Absolute: 0 10*3/uL (ref 0.0–0.1)
Basophils Relative: 0 % (ref 0–1)
Hemoglobin: 10.7 g/dL — ABNORMAL LOW (ref 12.0–15.0)
MCHC: 32.3 g/dL (ref 30.0–36.0)
Neutro Abs: 5.2 10*3/uL (ref 1.7–7.7)
Neutrophils Relative %: 62 % (ref 43–77)
Platelets: 208 10*3/uL (ref 150–400)
RDW: 15.6 % — ABNORMAL HIGH (ref 11.5–15.5)

## 2012-07-11 LAB — BASIC METABOLIC PANEL
Chloride: 101 mEq/L (ref 96–112)
GFR calc Af Amer: 35 mL/min — ABNORMAL LOW (ref >90)
Potassium: 3.8 mEq/L (ref 3.5–5.1)

## 2012-07-11 LAB — PRO B NATRIURETIC PEPTIDE: Pro B Natriuretic peptide (BNP): 2171 pg/mL — ABNORMAL HIGH (ref 0–450)

## 2012-07-11 LAB — TROPONIN I
Troponin I: <0.3 ng/mL (ref <0.30)
Troponin I: <0.3 ng/mL (ref <0.30)

## 2012-07-11 LAB — PROTIME-INR: Prothrombin Time: 33.4 seconds — ABNORMAL HIGH (ref 11.6–15.2)

## 2012-07-11 MED ORDER — INSULIN ASPART 100 UNIT/ML ~~LOC~~ SOLN
0.0000 [IU] | Freq: Three times a day (TID) | SUBCUTANEOUS | Status: DC
  Administered 2012-07-11: 2 [IU] via SUBCUTANEOUS
  Administered 2012-07-12: 8 [IU] via SUBCUTANEOUS
  Administered 2012-07-12: 3 [IU] via SUBCUTANEOUS
  Administered 2012-07-12: 11 [IU] via SUBCUTANEOUS
  Administered 2012-07-13 (×2): 8 [IU] via SUBCUTANEOUS
  Administered 2012-07-13 – 2012-07-14 (×2): 2 [IU] via SUBCUTANEOUS
  Administered 2012-07-14 (×2): 3 [IU] via SUBCUTANEOUS
  Administered 2012-07-15: 5 [IU] via SUBCUTANEOUS
  Administered 2012-07-15 – 2012-07-16 (×2): 3 [IU] via SUBCUTANEOUS
  Administered 2012-07-16 – 2012-07-17 (×4): 5 [IU] via SUBCUTANEOUS
  Administered 2012-07-19 (×2): 3 [IU] via SUBCUTANEOUS
  Administered 2012-07-20: 5 [IU] via SUBCUTANEOUS
  Administered 2012-07-20: 3 [IU] via SUBCUTANEOUS
  Administered 2012-07-20: 8 [IU] via SUBCUTANEOUS
  Administered 2012-07-21: 5 [IU] via SUBCUTANEOUS
  Administered 2012-07-21 (×2): 8 [IU] via SUBCUTANEOUS
  Administered 2012-07-22: 11 [IU] via SUBCUTANEOUS
  Administered 2012-07-22: 5 [IU] via SUBCUTANEOUS
  Administered 2012-07-22: 11 [IU] via SUBCUTANEOUS
  Administered 2012-07-23 (×2): 3 [IU] via SUBCUTANEOUS
  Administered 2012-07-23: 15 [IU] via SUBCUTANEOUS
  Administered 2012-07-24 (×2): 5 [IU] via SUBCUTANEOUS
  Administered 2012-07-24: 8 [IU] via SUBCUTANEOUS
  Administered 2012-07-25: 5 [IU] via SUBCUTANEOUS

## 2012-07-11 MED ORDER — ALPRAZOLAM 0.25 MG PO TABS
0.2500 mg | ORAL_TABLET | Freq: Two times a day (BID) | ORAL | Status: DC | PRN
  Administered 2012-07-14 – 2012-07-25 (×8): 0.25 mg via ORAL
  Filled 2012-07-11 (×9): qty 1

## 2012-07-11 MED ORDER — SODIUM CHLORIDE 0.9 % IV SOLN
250.0000 mL | INTRAVENOUS | Status: DC | PRN
  Administered 2012-07-11: 10 mL/h via INTRAVENOUS
  Administered 2012-07-15: 250 mL via INTRAVENOUS
  Administered 2012-07-18: 10 mL via INTRAVENOUS

## 2012-07-11 MED ORDER — ACETAMINOPHEN 325 MG PO TABS
650.0000 mg | ORAL_TABLET | ORAL | Status: DC | PRN
  Administered 2012-07-14: 650 mg via ORAL
  Filled 2012-07-11: qty 2

## 2012-07-11 MED ORDER — ASPIRIN EC 81 MG PO TBEC
81.0000 mg | DELAYED_RELEASE_TABLET | Freq: Every day | ORAL | Status: DC
  Administered 2012-07-11 – 2012-07-26 (×16): 81 mg via ORAL
  Filled 2012-07-11 (×16): qty 1

## 2012-07-11 MED ORDER — NITROGLYCERIN 0.4 MG SL SUBL: 0.4000 mg | SUBLINGUAL_TABLET | SUBLINGUAL | Status: DC | PRN

## 2012-07-11 MED ORDER — DILTIAZEM LOAD VIA INFUSION
10.0000 mg | Freq: Once | INTRAVENOUS | Status: DC
  Filled 2012-07-11: qty 10

## 2012-07-11 MED ORDER — FUROSEMIDE 10 MG/ML IJ SOLN
40.0000 mg | Freq: Two times a day (BID) | INTRAMUSCULAR | Status: DC
  Administered 2012-07-11 – 2012-07-12 (×3): 40 mg via INTRAVENOUS
  Filled 2012-07-11 (×5): qty 4

## 2012-07-11 MED ORDER — DONEPEZIL HCL 5 MG PO TABS
5.0000 mg | ORAL_TABLET | Freq: Every day | ORAL | Status: DC
  Administered 2012-07-11 – 2012-07-25 (×15): 5 mg via ORAL
  Filled 2012-07-11 (×16): qty 1

## 2012-07-11 MED ORDER — PANTOPRAZOLE SODIUM 40 MG PO TBEC
80.0000 mg | DELAYED_RELEASE_TABLET | Freq: Every day | ORAL | Status: DC
  Administered 2012-07-11 – 2012-07-26 (×15): 80 mg via ORAL
  Filled 2012-07-11: qty 2
  Filled 2012-07-11: qty 1
  Filled 2012-07-11: qty 2
  Filled 2012-07-11: qty 1
  Filled 2012-07-11: qty 2
  Filled 2012-07-11 (×2): qty 1
  Filled 2012-07-11 (×3): qty 2
  Filled 2012-07-11 (×2): qty 1
  Filled 2012-07-11: qty 2
  Filled 2012-07-11: qty 1
  Filled 2012-07-11: qty 2
  Filled 2012-07-11: qty 1
  Filled 2012-07-11: qty 2
  Filled 2012-07-11: qty 1
  Filled 2012-07-11: qty 2

## 2012-07-11 MED ORDER — WARFARIN - PHARMACIST DOSING INPATIENT: Freq: Every day | Status: DC

## 2012-07-11 MED ORDER — INSULIN GLARGINE 100 UNIT/ML ~~LOC~~ SOLN
25.0000 [IU] | Freq: Two times a day (BID) | SUBCUTANEOUS | Status: DC
  Administered 2012-07-11 – 2012-07-15 (×8): 25 [IU] via SUBCUTANEOUS

## 2012-07-11 MED ORDER — ONDANSETRON HCL 4 MG/2ML IJ SOLN: 4.0000 mg | Freq: Four times a day (QID) | INTRAMUSCULAR | Status: DC | PRN

## 2012-07-11 MED ORDER — SODIUM CHLORIDE 0.9 % IJ SOLN
3.0000 mL | Freq: Two times a day (BID) | INTRAMUSCULAR | Status: DC
  Administered 2012-07-11 – 2012-07-24 (×16): 3 mL via INTRAVENOUS

## 2012-07-11 MED ORDER — LEVOTHYROXINE SODIUM 100 MCG PO TABS
100.0000 ug | ORAL_TABLET | Freq: Every day | ORAL | Status: DC
  Administered 2012-07-12 – 2012-07-26 (×14): 100 ug via ORAL
  Filled 2012-07-11 (×16): qty 1

## 2012-07-11 MED ORDER — AMITRIPTYLINE HCL 75 MG PO TABS
75.0000 mg | ORAL_TABLET | Freq: Every day | ORAL | Status: DC
  Administered 2012-07-11 – 2012-07-25 (×15): 75 mg via ORAL
  Filled 2012-07-11 (×16): qty 1

## 2012-07-11 MED ORDER — ATORVASTATIN CALCIUM 40 MG PO TABS
40.0000 mg | ORAL_TABLET | Freq: Every evening | ORAL | Status: DC
  Administered 2012-07-11 – 2012-07-26 (×15): 40 mg via ORAL
  Filled 2012-07-11 (×16): qty 1

## 2012-07-11 MED ORDER — SODIUM CHLORIDE 0.9 % IJ SOLN: 3.0000 mL | INTRAMUSCULAR | Status: DC | PRN

## 2012-07-11 MED ORDER — VITAMIN K1 1 MG/0.5ML IJ SOLN
1.0000 mg | Freq: Once | INTRAMUSCULAR | Status: AC
  Administered 2012-07-11: 1 mg via SUBCUTANEOUS
  Filled 2012-07-11: qty 0.5

## 2012-07-11 MED ORDER — DEXTROSE 5 % IV SOLN
5.0000 mg/h | INTRAVENOUS | Status: DC
  Administered 2012-07-11 – 2012-07-12 (×2): 5 mg/h via INTRAVENOUS
  Administered 2012-07-13: 10 mg/h via INTRAVENOUS
  Filled 2012-07-11 (×2): qty 100

## 2012-07-11 MED ORDER — POTASSIUM CHLORIDE CRYS ER 20 MEQ PO TBCR
20.0000 meq | EXTENDED_RELEASE_TABLET | Freq: Two times a day (BID) | ORAL | Status: DC
  Administered 2012-07-11 – 2012-07-18 (×14): 20 meq via ORAL
  Filled 2012-07-11 (×18): qty 1

## 2012-07-11 MED ORDER — METOPROLOL SUCCINATE ER 50 MG PO TB24
50.0000 mg | ORAL_TABLET | Freq: Two times a day (BID) | ORAL | Status: DC
  Administered 2012-07-11 – 2012-07-13 (×5): 50 mg via ORAL
  Filled 2012-07-11 (×7): qty 1

## 2012-07-11 NOTE — Progress Notes (Signed)
Patient ID: [name], female   DOB: December 31, 1932, 79 y.o.   MRN: [id number] Reason for Consult:evaluate atrial fibrillation with an RVR  Referring Physician: Dr. Swaziland  [name] is an 79 y.o. female.   HPI: The patient is a 79 yo woman with a h/o CAD, HTN, obesity, and atrial fibrillation. She has been in and out of the hospital multiple times in the past 6 months with atrial fibrillation and a RVR. She moderate to severe LV dysfunction with an EF 30% by echo. She also has palpitations. She has been on multiple anti-arrhythmic drug therapies including amiodarone. She noted increasing peripheral edema and weight today and was admitted for evaluation. She is in atrial fibrillation with an RVR. She is referred for consideration of AV node ablation.   PMH: Past Medical History  Diagnosis Date  . Coronary artery disease     a. Severe two vessel; s/p CABG;  b. 05/2012 NSTEMI in setting of rapid aflutter  . Hypertension   . Diabetes mellitus     Type 2  . Hyperlipidemia   . Diabetic neuropathy   . PAF (paroxysmal atrial fibrillation)     a. Failed medical therapy with amiodarone; not a candidate for class IC meds due to her CAD. No Multaq due to CHF hx; QT prolonged with amiodarone; Not able to be ablated due to valve disease  . CVA (cerebral vascular accident) 2004  . Hypothyroidism   . Chronic anemia   . Diastolic dysfunction     per echo in May of 2012  . GERD (gastroesophageal reflux disease)   . Arthritis   . Memory loss, short term   . COPD (chronic obstructive pulmonary disease)   . Blood transfusion without reported diagnosis   . Chronic combined systolic and diastolic CHF (congestive heart failure)     a. 05/2012 Echo: EF 30%, diff HK, Mild AS, MR, Mod TR, PASP .  . Moderate mitral regurgitation     a. mod by TEE 2012, mild by echo 2013  . Severe tricuspid regurgitation     a. Severe by TEE 2012, mod by echo 2013  . Atrial flutter     a. 05/2012 -> rate  controlled, coumadin resumed.    PSHX: Past Surgical History  Procedure Date  . Coronary artery bypass graft 06/2009    X3, LIMA to LAD, SVG to diagonal, SVG to the posterior descending artery.  . Tonsillectomy   . Knee arthroscopy   . Transthoracic echocardiogram 11/12/2010     Ejection fraction felt to be around 50%.  Wall thickness was increased in a pattern of mild LVH.  Moderately dilated left atrium.  Mildly dilated right atrium. Right ventricle was mildly dilated with mildly reduced systolic function  . Cardiac catheterization 06/09/2009    inferior wall hypokinesia with ejection fraction of 55%.  . Abdominal hysterectomy   . Joint replacement   . Tee without cardioversion 05/23/2011    Procedure: TRANSESOPHAGEAL ECHOCARDIOGRAM (TEE);  Surgeon: Lewayne Bunting, MD;  Location: [facility] ENDOSCOPY;  Service: Cardiovascular;  Laterality: N/A;  . Incision and drainage abscess 05/07/2012    Procedure: INCISION AND DRAINAGE ABSCESS;  Surgeon: Ardeth Sportsman, MD;  Location: [facility] OR;  Service: General;  Laterality: N/A;  Groin wound    FAMHX: Family History  Problem Relation Age of Onset  . Colon cancer Mother   . Stroke Mother   . Hypertension Mother   . Cancer Mother     breast  . Cancer  Father     colon  . Heart attack Father     x2    Social History:  reports that she has quit smoking. She quit smokeless tobacco use about 40 years ago. She reports that she does not drink alcohol or use illicit drugs.  Allergies:  Allergies  Allergen Reactions  . Betadine (Povidone Iodine) Itching  . Capoten (Captopril) Other (See Comments)    unknown  . Methadone   . Penicillins Swelling  . Propoxyphene And Methadone     Intolerance to darvocet    Medications: reviewed No results found.  ROS  As stated in the HPI and negative for all other systems.  Physical Exam  Vitals:Blood pressure 97/66, pulse 58, temperature 97.9 F (36.6 C), temperature source Oral, resp. rate 20, height 5'  3" (1.6 m), weight 221 lb 12.5 oz (100.6 kg), SpO2 98.00%.  Well appearing 79 yo woman, NAD HEENT: Unremarkable Neck:   9 cm JVD, no thyromegally Lungs:  Rales in the bases 1/3 up bilaterally. HEART: Iregular tachycardic rhythm with a soft MR murmur Abd:  Soft, positive bowel sounds, no rebound, no guarding Ext:  2 plus pulses, 2+edema,no cyanosis, no clubbing, venous insufficiency is present Skin:  No rashes no nodules Neuro:  CN II through XII intact, motor grossly intact  CXR - reviewed ECG - Atrial fibrillation with an RVR  Assessment/Plan: 1. Atrial fibrillation, uncontrolled 2. ICM, s/p CABG 3. Atrial flutter 4. Acute on chronic systolic CHF Rec: She is in  Need if tuning up. She would be a candidate for AV node ablation and BiV PPM. I have discussed the timing with the patient and her family. She has an elevated INR. Will give a small dose of Vit.K. She will need IV diuresis. Agree with IV cardizem. Sharlot Gowda TaylorMD 07/11/2012, 6:18 PM

## 2012-07-11 NOTE — Progress Notes (Signed)
ANTICOAGULATION CONSULT NOTE - Initial Consult  Pharmacy Consult for Coumadin Indication: atrial fibrillation  Allergies  Allergen Reactions  . Betadine (Povidone Iodine) Itching  . Capoten (Captopril) Other (See Comments)    unknown  . Methadone   . Penicillins Swelling  . Propoxyphene And Methadone     Intolerance to darvocet    Patient Measurements: Height: 5\' 3"  (160 cm) Weight: 221 lb 12.5 oz (100.6 kg) IBW/kg (Calculated) : 52.4   Vital Signs: Temp: 97.9 F (36.6 C) (01/08 1600) Temp src: Oral (01/08 1600) BP: 97/66 mmHg (01/08 1600) Pulse Rate: 58  (01/08 1600)  Labs:  Basename 07/11/12 1537  HGB 10.7*  HCT 33.1*  PLT 208  APTT --  LABPROT 33.4*  INR 3.54*  HEPARINUNFRC --  CREATININE --  CKTOTAL --  CKMB --  TROPONINI --    Estimated Creatinine Clearance: 29.6 ml/min (by C-G formula based on Cr of 1.8).   Medical History: Past Medical History  Diagnosis Date  . Coronary artery disease     a. Severe two vessel; s/p CABG;  b. 05/2012 NSTEMI in setting of rapid aflutter  . Hypertension   . Diabetes mellitus     Type 2  . Hyperlipidemia   . Diabetic neuropathy   . PAF (paroxysmal atrial fibrillation)     a. Failed medical therapy with amiodarone; not a candidate for class IC meds due to her CAD. No Multaq due to CHF hx; QT prolonged with amiodarone; Not able to be ablated due to valve disease  . CVA (cerebral vascular accident) 2004  . Hypothyroidism   . Chronic anemia   . Diastolic dysfunction     per echo in May of 2012  . GERD (gastroesophageal reflux disease)   . Arthritis   . Memory loss, short term   . COPD (chronic obstructive pulmonary disease)   . Blood transfusion without reported diagnosis   . Chronic combined systolic and diastolic CHF (congestive heart failure)     a. 05/2012 Echo: EF 30%, diff HK, Mild AS, MR, Mod TR, PASP .  . Moderate mitral regurgitation     a. mod by TEE 2012, mild by echo 2013  . Severe tricuspid  regurgitation     a. Severe by TEE 2012, mod by echo 2013  . Atrial flutter     a. 05/2012 -> rate controlled, coumadin resumed.   Assessment:  Admitted from the office for CHF exacerbation and atrial flutter with rapid response.  Has been on Coumadin at home. INR 3.5 in office on 12/30. Skipped 12/31 dose, then regimen adjusted to 7.5 mg daily, except 5 mg on Tuesdays and Fridays.  She took 7.5 mg at home this morning.  INR is supratherapeutic again today.  Goal of Therapy:  INR 2-3 Monitor platelets by anticoagulation protocol: Yes   Plan:   No Coumadin tonight.  Daily PT/INR.  Will give daily Coumadin doses in the evening while in the hospital; she is agreeable.  Dennie Fetters, [place] Pager: [phone] 07/11/2012,4:38 PM

## 2012-07-11 NOTE — H&P (Signed)
Physician History and Physical    Patient ID: [name] MRN: [id number] DOB/AGE: 02/08/1933 79 y.o. Admit date: 07/11/2012  Primary Care Physician: Creola Corn MD Primary Cardiologist: Sheanna Dail [place] MD  HPI: Mrs. ziska is a 79 year old black female seen today for followup of congestive heart failure and atrial fibrillation. She has a history of paroxysmal atrial fibrillation and flutter. She had failed amiodarone therapy due to QT prolongation and intolerance. She is also not a candidate for Tikosyn, sotalol, 1C antiarrhythmics, or Multaq because of her coronary disease, congestive heart failure, and prolonged QT. She was felt not to be a candidate for ablative therapies because of her congestive heart failure, atrial enlargement, and tricuspid insufficiency. She has been managed with rate control. She was last admitted to the hospital in November with CHF exacerbation and atrial fibrillation. Her son is with her today and reports that her daughter has been measuring her medications out each day. She has been on diltiazem and metoprolol for rate control. She is on chronic anticoagulation. On presentation today she reports a two-week history of her heart pounding and racing. She complains of increased dyspnea both at rest and with exertion. She becomes very fatigued with any activity. Her weight has increased 16 pounds. She has a nonproductive cough. On examination she was found to be in atrial flutter with a rate of 160 beats per minute. She has significant exacerbation of her congestive heart failure with increased weight gain and edema. For this reason she will be admitted for IV rate control with diltiazem and IV diuresis. She will need EP evaluation to determine optimal long-term therapy.  Review of systems is positive for increased dyspnea, fatigue, and edema. She complains of tachycardia and pounding. She has a dry cough. No fever or chills. She denies any recent GI bleeding. She's had no  dysuria. She does note frequent urination. All other systems are negative unless listed above  Past Medical History  Diagnosis Date  . Coronary artery disease     a. Severe two vessel; s/p CABG;  b. 05/2012 NSTEMI in setting of rapid aflutter  . Hypertension   . Diabetes mellitus     Type 2  . Hyperlipidemia   . Diabetic neuropathy   . PAF (paroxysmal atrial fibrillation)     a. Failed medical therapy with amiodarone; not a candidate for class IC meds due to her CAD. No Multaq due to CHF hx; QT prolonged with amiodarone; Not able to be ablated due to valve disease  . CVA (cerebral vascular accident) 2004  . Hypothyroidism   . Chronic anemia   . Diastolic dysfunction     per echo in May of 2012  . GERD (gastroesophageal reflux disease)   . Arthritis   . Memory loss, short term   . COPD (chronic obstructive pulmonary disease)   . Blood transfusion without reported diagnosis   . Chronic combined systolic and diastolic CHF (congestive heart failure)     a. 05/2012 Echo: EF 30%, diff HK, Mild AS, MR, Mod TR, PASP .  . Moderate mitral regurgitation     a. mod by TEE 2012, mild by echo 2013  . Severe tricuspid regurgitation     a. Severe by TEE 2012, mod by echo 2013  . Atrial flutter     a. 05/2012 -> rate controlled, coumadin resumed.    Family History  Problem Relation Age of Onset  . Colon cancer Mother   . Stroke Mother   . Hypertension Mother   .  Cancer Mother     breast  . Cancer Father     colon  . Heart attack Father     x2    History   Social History  . Marital Status: Married    Spouse Name: N/A    Number of Children: 2  . Years of Education: N/A   Occupational History  . teacher    Social History Main Topics  . Smoking status: Former Games developer  . Smokeless tobacco: Former Neurosurgeon    Quit date: 07/04/1972  . Alcohol Use: No  . Drug Use: No  . Sexually Active: No   Other Topics Concern  . Not on file   Social History Narrative   Lives in [place]  with spouse    Past Surgical History  Procedure Date  . Coronary artery bypass graft 06/2009    X3, LIMA to LAD, SVG to diagonal, SVG to the posterior descending artery.  . Tonsillectomy   . Knee arthroscopy   . Transthoracic echocardiogram 11/12/2010     Ejection fraction felt to be around 50%.  Wall thickness was increased in a pattern of mild LVH.  Moderately dilated left atrium.  Mildly dilated right atrium. Right ventricle was mildly dilated with mildly reduced systolic function  . Cardiac catheterization 06/09/2009    inferior wall hypokinesia with ejection fraction of 55%.  . Abdominal hysterectomy   . Joint replacement   . Tee without cardioversion 05/23/2011    Procedure: TRANSESOPHAGEAL ECHOCARDIOGRAM (TEE);  Surgeon: Lewayne Bunting, MD;  Location: [facility] ENDOSCOPY;  Service: Cardiovascular;  Laterality: N/A;  . Incision and drainage abscess 05/07/2012    Procedure: INCISION AND DRAINAGE ABSCESS;  Surgeon: Ardeth Sportsman, MD;  Location: [facility] OR;  Service: General;  Laterality: N/A;  Groin wound     Prescriptions prior to admission  Medication Sig Dispense Refill  . amitriptyline (ELAVIL) 25 MG tablet Take 75 mg by mouth at bedtime.        Marland Kitchen aspirin EC 81 MG tablet Take 81 mg by mouth daily.      Marland Kitchen atorvastatin (LIPITOR) 40 MG tablet Take 40 mg by mouth every evening.       . diltiazem (CARDIZEM CD) 240 MG 24 hr capsule Take 1 capsule (240 mg total) by mouth daily.  30 capsule  6  . donepezil (ARICEPT) 5 MG tablet Take 5 mg by mouth at bedtime.      Marland Kitchen esomeprazole (NEXIUM) 40 MG capsule Take 40 mg by mouth daily before breakfast.        . furosemide (LASIX) 40 MG tablet Take 1 tablet (40 mg total) by mouth 2 (two) times daily.  60 tablet  6  . glimepiride (AMARYL) 4 MG tablet Take 0.5 tablets (2 mg total) by mouth 2 (two) times daily.      . insulin glargine (LANTUS) 100 UNIT/ML injection Inject 25 Units into the skin 2 (two) times daily.  10 mL    . levothyroxine (SYNTHROID,  LEVOTHROID) 100 MCG tablet Take 100 mcg by mouth daily.      . metoprolol succinate (TOPROL-XL) 50 MG 24 hr tablet Take 50 mg by mouth daily. Take with or immediately following a meal.      . nitroGLYCERIN (NITROSTAT) 0.4 MG SL tablet Place 1 tablet (0.4 mg total) under the tongue every 5 (five) minutes x 3 doses as needed for chest pain.  25 tablet  3  . potassium chloride SA (K-DUR,KLOR-CON) 20 MEQ tablet Take 1 tablet (20  mEq total) by mouth daily.  30 tablet  6  . warfarin (COUMADIN) 5 MG tablet Take 5 mg by mouth daily. As Directed        Physical Exam: Weight is 225 pounds 12.8 ounces, body mass index of 40 kg per meter square, oxygen saturation 92%, blood pressure 120/80, pulse 159 Gen.: Obese black female in mild respiratory distress HEENT: Normocephalic, atraumatic. Pupils are equal round and reactive. Sclera are clear. Oropharynx is clear. Neck is supple without adenopathy or thyromegaly. She has jugular venous distention to 6 cm. No bruits. Lungs: Mild basilar rales. Cardiovascular: Tachycardic and regular rhythm. No gallop or murmur audible. Abdomen: Soft, obese, and nontender. No hepatosplenomegaly or masses. Extremities: 2-3+ edema. Unable to palpate distal pulses because of edema. Skin: Warm and dry. Neuro: Alert and oriented x3. Cranial nerves II through XII are intact. Labs: Pending      EKG: Atrial flutter with 2-1 AV conduction rate of 157 beats per minute. Right bundle branch block.  ASSESSMENT AND PLAN:  1. Atrial flutter with rapid ventricular response despite rate control therapy with metoprolol and diltiazem. Patient is not a candidate for antiarrhythmic drug therapy as noted above. She has been deemed a poor candidate for atrial fibrillation ablation. We will admit and start on IV diltiazem for rate control. We will increase her metoprolol dose. We'll have EP see to the side optimal long-term therapy. Given her difficulty with congestive heart failure I wonder whether  AV node ablation would be the optimal therapy with pacemaker placement.  2. Acute on chronic systolic congestive heart failure. Weight is up 16 pounds over the past 2 weeks. She will need IV diuresis with Lasix. Echocardiogram performed in November showed an ejection fraction of 30% with global hypokinesis.  3. Coronary disease status post CABG in December of 2010. Recent Myoview study showed a low risk study with a small area of reversible ischemia in the distal lateral wall. Currently without angina.  4. Chronic kidney disease stage III.  5. 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 Hypertension  7. Moderate tricuspid insufficiency and mild mitral insufficiency  8. Chronic memory loss  9. Remote CVA in 2004.  10. Chronic anticoagulation.  SignedTheron Arista [facility] 07/11/2012, 1:55 PM

## 2012-07-11 NOTE — Progress Notes (Signed)
[name] is seen today for followup. She has significant exacerbation of her congestive heart failure with weight gain of 16 pounds. She is in atrial flutter with a rapid response 160 beats per minute. She will be admitted to the hospital for IV medications and management. Please see H&P/inpatient noted.

## 2012-07-12 ENCOUNTER — Observation Stay (HOSPITAL_COMMUNITY): Payer: Medicare Other

## 2012-07-12 ENCOUNTER — Other Ambulatory Visit: Payer: Medicare Other | Admitting: Lab

## 2012-07-12 ENCOUNTER — Ambulatory Visit: Payer: Medicare Other

## 2012-07-12 ENCOUNTER — Encounter (HOSPITAL_COMMUNITY): Payer: Self-pay | Admitting: Physician Assistant

## 2012-07-12 DIAGNOSIS — I4891 Unspecified atrial fibrillation: Secondary | ICD-10-CM

## 2012-07-12 DIAGNOSIS — I429 Cardiomyopathy, unspecified: Secondary | ICD-10-CM | POA: Diagnosis present

## 2012-07-12 DIAGNOSIS — E119 Type 2 diabetes mellitus without complications: Secondary | ICD-10-CM | POA: Diagnosis present

## 2012-07-12 DIAGNOSIS — D649 Anemia, unspecified: Secondary | ICD-10-CM | POA: Insufficient documentation

## 2012-07-12 DIAGNOSIS — I5043 Acute on chronic combined systolic (congestive) and diastolic (congestive) heart failure: Secondary | ICD-10-CM | POA: Diagnosis present

## 2012-07-12 LAB — BASIC METABOLIC PANEL
BUN: 28 mg/dL — ABNORMAL HIGH (ref 6–23)
Creatinine, Ser: 1.59 mg/dL — ABNORMAL HIGH (ref 0.50–1.10)
GFR calc Af Amer: 35 mL/min — ABNORMAL LOW (ref >90)
GFR calc non Af Amer: 30 mL/min — ABNORMAL LOW (ref >90)

## 2012-07-12 LAB — GLUCOSE, CAPILLARY: Glucose-Capillary: 306 mg/dL — ABNORMAL HIGH (ref 70–99)

## 2012-07-12 NOTE — Progress Notes (Signed)
Inpatient Diabetes Program Recommendations  AACE/ADA: New Consensus Statement on Inpatient Glycemic Control (2013)  Target Ranges:  Prepandial:   less than 140 mg/dL      Peak postprandial:   less than 180 mg/dL (1-2 hours)      Critically ill patients:  140 - 180 mg/dL   Reason for Visit: 1/8  CBGs 123-137-327 mg/dl    1/9   161-096 mg/dl  Inpatient Diabetes Program Recommendations Correction (SSI): Consider adding Novolog correction scale at HS along with meals if CBGs continue greater than 180 mg/dl.  Note:Continue Novolog MODERATE correction scale AC.

## 2012-07-12 NOTE — Progress Notes (Signed)
Utilization Review Completed.  07/12/2012

## 2012-07-12 NOTE — Progress Notes (Signed)
Subjective: 45 AA F with MMP admitted for AFib c RVR and CHF. Getting Vol/weight/CHF tuned up and then probably going for ablation c BiV Pacer Cr Holding CBGs erratic Her QOL and Function became very poor prior to admission. Less c/o this am.  Objective: Vital signs in last 24 hours: Temp:  [97.8 F (36.6 C)-98.7 F (37.1 C)] 98 F (36.7 C) (01/09 0400) Pulse Rate:  [39-159] 73  (01/09 0600) Resp:  [17-24] 24  (01/09 0600) BP: (93-130)/(45-81) 99/58 mmHg (01/09 0600) SpO2:  [92 %-99 %] 94 % (01/09 0600) Weight:  [100.5 kg (221 lb 9 oz)-102.422 kg (225 lb 12.8 oz)] 100.5 kg (221 lb 9 oz) (01/09 0500) Weight change:  Last BM Date: 07/10/12  CBG (last 3)   Basename 07/11/12 2151 07/11/12 1633  GLUCAP 327* 137*    Intake/Output from previous day:  Intake/Output Summary (Last 24 hours) at 07/12/12 0644 Last data filed at 07/12/12 0420  Gross per 24 hour  Intake 817.84 ml  Output   1525 ml  Net -707.16 ml   01/08 0701 - 01/09 0700 In: 817.8 [P.O.:560; I.V.:253.8; IV Piggyback:4] Out: 1525 [Urine:1525]   Physical Exam  General appearance: In bed in CCU - Made up and comfortable. Resp: Distant but clear Cardio: Irreg but rate controlled in 80-90's. + JVD GI: soft, non-tender; bowel sounds normal; no masses,  no organomegaly Extremities: 1-2 + Edema   Lab Results:  Basename 07/12/12 0231 07/11/12 1537  NA 133* 137  K 3.9 3.8  CL 98 101  CO2 27 28  GLUCOSE 312* 123*  BUN 28* 25*  CREATININE 1.59* 1.60*  CALCIUM 8.6 9.3  MG -- --  PHOS -- --    No results found for this basename: AST:2,ALT:2,ALKPHOS:2,BILITOT:2,PROT:2,ALBUMIN:2 in the last 72 hours   Basename 07/11/12 1537  WBC 8.5  NEUTROABS 5.2  HGB 10.7*  HCT 33.1*  MCV 90.9  PLT 208    Lab Results  Component Value Date   INR 3.44* 07/12/2012   INR 3.54* 07/11/2012   INR 3.5 07/02/2012     Basename 07/12/12 0230 07/11/12 2057 07/11/12 1537  CKTOTAL -- -- --  CKMB -- -- --  CKMBINDEX -- --  --  TROPONINI <0.30 <0.30 <0.30    No results found for this basename: TSH,T4TOTAL,FREET3,T3FREE,THYROIDAB in the last 72 hours  No results found for this basename: VITAMINB12:2,FOLATE:2,FERRITIN:2,TIBC:2,IRON:2,RETICCTPCT:2 in the last 72 hours  Micro Results: Recent Results (from the past 240 hour(s))  MRSA PCR SCREENING     Status: Normal   Collection Time   07/11/12  2:32 PM      Component Value Range Status Comment   MRSA by PCR NEGATIVE  NEGATIVE Final      Studies/Results: No results found.   Medications: Scheduled:   . amitriptyline  75 mg Oral QHS  . aspirin EC  81 mg Oral Daily  . atorvastatin  40 mg Oral QPM  . diltiazem  10 mg Intravenous Once  . donepezil  5 mg Oral QHS  . furosemide  40 mg Intravenous BID  . insulin aspart  0-15 Units Subcutaneous TID WC  . insulin glargine  25 Units Subcutaneous BID  . levothyroxine  100 mcg Oral QAC breakfast  . metoprolol succinate  50 mg Oral BID  . pantoprazole  80 mg Oral Q1200  . potassium chloride SA  20 mEq Oral BID  . sodium chloride  3 mL Intravenous Q12H   Continuous:   . diltiazem (CARDIZEM) infusion Stopped (07/12/12  1610)     Assessment/Plan: Active Problems:  * No active hospital problems. *   Afib c RVR on Rx and Rate better controlled.  For Ablation and BiV Pacer when tuned up.  I am confused as Theodore Demark says she is poor candidate but Dr Ladona Ridgel says she is a candidate.  Please clarify.  CHF being Diuresed and tuned up.  CAD/CABG  CKD3 - Cr stable with her Diuresis.  HTN - BP is fine to low.  DM2 complicated by nephropathy and neuropathy with erratic control.  Continue Lantus/ISS-  Basename 07/11/12 2151 07/11/12 1633  GLUCAP 327* 137*   DVT Prophylaxis/Antticoagulatio - Coumadin to goal 2-3  Dementia/Chronic memory loss - follow, lives with husband.  Her memory issues contributed to her using her insulin in R groin causing R groin Abscess a few months ago - she is better.  Chronic  Anemia - Hbg 10.7 and stable.    LOS: 1 day   Brandye Inthavong M 07/12/2012, 6:44 AM

## 2012-07-12 NOTE — Progress Notes (Signed)
Patient Name: [name] Date of Encounter: 07/12/2012  Principal Problem:  *Acute on chronic combined systolic and diastolic congestive heart failure, NYHA class 4 Active Problems:  Atrial fibrillation with rapid ventricular response  Hypothyroidism  Chronic anticoagulation  CAD (coronary artery disease)  CKD (chronic kidney disease) stage 3, GFR 30-59 ml/min  Diabetes mellitus  Chronic anemia    SUBJECTIVE:  Breathing much better, no palpitations. Weight was creeping up PTA.  OBJECTIVE Filed Vitals:   07/12/12 0300 07/12/12 0400 07/12/12 0500 07/12/12 0600  BP: 100/61 93/61  99/58  Pulse: 73 72  73  Temp:  98 F (36.7 C)    TempSrc:  Oral    Resp: 19 20 22 24   Height:      Weight:   221 lb 9 oz (100.5 kg)   SpO2: 94% 93%  94%    Intake/Output Summary (Last 24 hours) at 07/12/12 0730 Last data filed at 07/12/12 0600  Gross per 24 hour  Intake 857.84 ml  Output   1825 ml  Net -967.16 ml   Filed Weights   07/11/12 1454 07/12/12 0500  Weight: 221 lb 12.5 oz (100.6 kg) 221 lb 9 oz (100.5 kg)    PHYSICAL EXAM General: Well developed, well nourished, female in no acute distress. Head: Normocephalic, atraumatic.  Neck: Supple without bruits, JVD 8-10 cm. Lungs:  Resp regular and unlabored, decreased BS bases R>L. Heart: Slightly irregular, S1, S2, no S3, S4, or murmur. Abdomen: Soft, non-tender, non-distended, BS + x 4.  Extremities: No clubbing, cyanosis, 1+ edema.  Neuro: Alert and oriented X 3. Moves all extremities spontaneously. Psych: Normal affect.  LABS: CBC:  Basename 07/11/12 1537  WBC 8.5  NEUTROABS 5.2  HGB 10.7*  HCT 33.1*  MCV 90.9  PLT 208   INR:  Basename 07/12/12 0231  INR 3.44*   Basic Metabolic Panel:  Basename 07/12/12 0231 07/11/12 1537  NA 133* 137  K 3.9 3.8  CL 98 101  CO2 27 28  GLUCOSE 312* 123*  BUN 28* 25*  CREATININE 1.59* 1.60*  CALCIUM 8.6 9.3  MG -- --  PHOS -- --   Liver Function Tests:No results  found for this basename: AST:2,ALT:2,ALKPHOS:2,BILITOT:2,PROT:2,ALBUMIN:2 in the last 72 hours Cardiac Enzymes:  Basename 07/12/12 0230 07/11/12 2057 07/11/12 1537  CKTOTAL -- -- --  CKMB -- -- --  CKMBINDEX -- -- --  TROPONINI <0.30 <0.30 <0.30   No results found for this basename: TROPIPOC:2 in the last 72 hours BNP: Pro B Natriuretic peptide (BNP)  Date/Time Value Range Status  07/11/2012  3:37 PM 2171.0* 0 - 450 pg/mL Final  05/26/2012  1:47 AM 2740.0* 0 - 450 pg/mL Final    TELE:  Converted to SR yesterday, HR now controlled, has ectopy with PACs      ECG:12-Jul-2012 06:58:57 [facility] ROUTINE RECORD Unusual P axis, possible ectopic atrial rhythm Right bundle branch block Abnormal ECG 7mm/s 72mm/mV 100Hz  8.0.1 12SL 241 HD CID: 0 Referred by: Unconfirmed Vent. rate 73 BPM PR interval 162 ms QRS duration 144 ms QT/QTc 462/508 ms P-R-T axes 239 72 85   Radiology/Studies: CXR results pending - MD review, may need lateral to assess effusion  Current Medications:     . amitriptyline  75 mg Oral QHS  . aspirin EC  81 mg Oral Daily  . atorvastatin  40 mg Oral QPM  . diltiazem  10 mg Intravenous Once  . donepezil  5 mg Oral QHS  .  furosemide  40 mg Intravenous BID  . insulin aspart  0-15 Units Subcutaneous TID WC  . insulin glargine  25 Units Subcutaneous BID  . levothyroxine  100 mcg Oral QAC breakfast  . metoprolol succinate  50 mg Oral BID  . pantoprazole  80 mg Oral Q1200  . potassium chloride SA  20 mEq Oral BID  . sodium chloride  3 mL Intravenous Q12H      . diltiazem (CARDIZEM) infusion Stopped (07/12/12 0420)    ASSESSMENT AND PLAN: ASSESSMENT AND PLAN:  1. Atrial flutter with rapid ventricular response: Rate was elevated since admission despite rate control therapy with metoprolol and diltiazem IV. Patient is not a candidate for antiarrhythmic drug therapy as noted above. She has been deemed a poor candidate for atrial fibrillation  ablation. Dr Ladona Ridgel with EP saw 1/8 and felt she would be a candidate for AV node ablation and BiV PPM as optimal long-term therapy. MD advise   2. Acute on chronic systolic congestive heart failure. Weight is up 16 pounds over the past 2 weeks. She still needs IV diuresis but I/O not strongly negative and no real weight change on Lasix 40 IV BID, will change to Q 8H and follow. Echocardiogram performed in November showed an ejection fraction of 30% with global hypokinesis. Hx diastolic dysfunction but did not see on echo report, MD advise. Check LFTs in am with diuresis.  3. Coronary disease status post CABG in December of 2010. Recent Myoview study showed a low risk study with a small area of reversible ischemia in the distal lateral wall. Currently without angina.   4. Chronic kidney disease stage III. No sig change so far, follow.  5. 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 Hypertension - good control on current Rx  7. Moderate tricuspid insufficiency and mild mitral insufficiency  - see echo report  8. Chronic memory loss  - follow, lives with husband  9. Remote CVA in 2004 - follow  10. Chronic anticoagulation - s/p Vit K 1 mg subcu last pm, coum held, follow INR  11. Anemia, chronic - FOB has been + in past with nl iron/ferritin, continue to follow, guiac stools here  Signed, Theodore Demark , PA-C 7:30 AM 07/12/2012 Patient seen and examined and history reviewed. Agree with above findings and plan. Patient in and out of atrial fibrillation/flutter with RVR. Now back on IV diltiazem. (this was stopped earlier due to low BP). Was in ectopic atrial rhythm when she converted. Fair diuresis. Breathing improved and edema less but still 2+. CXR shows marked CM with a right pleural effusion. Agree with EP plans for biV PPM and then AV node ablation. INR 3.44 today. May need to wait until INR comes down. Will defer to EP whether to give additional vitamin K. I would  continue current Lasix dose given soft BP.  Theron Arista [facility] 07/12/2012 10:19 AM

## 2012-07-12 NOTE — Progress Notes (Signed)
ANTICOAGULATION CONSULT NOTE - Initial Consult  Pharmacy Consult for Coumadin Indication: atrial fibrillation  Allergies  Allergen Reactions  . Betadine (Povidone Iodine) Itching  . Capoten (Captopril) Other (See Comments)    unknown  . Methadone   . Penicillins Swelling  . Propoxyphene And Methadone     Intolerance to darvocet    Patient Measurements: Height: 5\' 3"  (160 cm) Weight: 221 lb 9 oz (100.5 kg) IBW/kg (Calculated) : 52.4   Vital Signs: Temp: 97.5 F (36.4 C) (01/09 0800) Temp src: Oral (01/09 0800) BP: 117/66 mmHg (01/09 1000) Pulse Rate: 49  (01/09 1000)  Labs:  Basename 07/12/12 0231 07/12/12 0230 07/11/12 2057 07/11/12 1537  HGB -- -- -- 10.7*  HCT -- -- -- 33.1*  PLT -- -- -- 208  APTT -- -- -- --  LABPROT 32.7* -- -- 33.4*  INR 3.44* -- -- 3.54*  HEPARINUNFRC -- -- -- --  CREATININE 1.59* -- -- 1.60*  CKTOTAL -- -- -- --  CKMB -- -- -- --  TROPONINI -- <0.30 <0.30 <0.30    Estimated Creatinine Clearance: 33.5 ml/min (by C-G formula based on Cr of 1.59).   Medical History: Past Medical History  Diagnosis Date  . Coronary artery disease     a. Severe two vessel; s/p CABG;  b. 05/2012 NSTEMI in setting of rapid aflutter  . Hypertension   . Diabetes mellitus     Type 2  . Hyperlipidemia   . Diabetic neuropathy   . PAF (paroxysmal atrial fibrillation)     a. Failed medical therapy with amiodarone; not a candidate for class IC meds due to her CAD. No Multaq due to CHF hx; QT prolonged with amiodarone; Not able to be ablated due to valve disease  . CVA (cerebral vascular accident) 2004  . Hypothyroidism   . Chronic anemia   . Diastolic dysfunction     per echo in May of 2012  . GERD (gastroesophageal reflux disease)   . Arthritis   . Memory loss, short term   . COPD (chronic obstructive pulmonary disease)   . Blood transfusion without reported diagnosis   . Chronic combined systolic and diastolic CHF (congestive heart failure)     a.  05/2012 Echo: EF 30%, diff HK, Mild AS, MR, Mod TR, PASP .  . Moderate mitral regurgitation     a. mod by TEE 2012, mild by echo 2013  . Severe tricuspid regurgitation     a. Severe by TEE 2012, mod by echo 2013  . Atrial flutter     a. 05/2012 -> rate controlled, coumadin resumed.   Assessment: 64 YOF Admitted from the office for CHF exacerbation and atrial flutter with rapid response, possible AV node ablation and BiV PPM. Has been on Coumadin at home, INR 3.54 on admission, s/p vitamin K 1mg  sq on 1/8 today. Last coumadin dose 7.5mg  on 1/8. INR remains supratherapeutic this am = 3.44  Coumadin PTA dose: 7.5 mg daily except 5 mg on Tues & Fri. Per [facility] note INR goal 2-2.5.  Goal of Therapy:  INR 2-2.5 per [facility] note Monitor platelets by anticoagulation protocol: Yes   Plan:  - No Coumadin tonight. - Daily PT/INR.  Bayard Hugger, PharmD, BCPS  Clinical Pharmacist  Pager: [phone]   07/12/2012,10:13 AM

## 2012-07-12 NOTE — Progress Notes (Signed)
Patient Name: [name]      SUBJECTIVE:* tracings reviewed  Afib 5/12 and subsequrent tracings are most consistent with atrial tach with Atrial rates of 120 with both upright p waves (fall 12) and inverted pwave (fall13-present)  She is not in sinus rhythm  Her INR is elevated and she received Vit K last pm; no change  BP has been limiting of AV nodal blockade  There has been interval worseing of LV function 12>>13 with EF 55>>30-% without good explanation although this could be to persistent atrial tach--  Plan is for AV ablation and pacing  Her Echo 11/12>>severe and torrential TR with RV dysfunction  12/13 "mod TR" and normal RV function Past Medical History  Diagnosis Date  . Coronary artery disease     a. Severe two vessel; s/p CABG;  b. 05/2012 NSTEMI in setting of rapid aflutter  . Hypertension   . Diabetes mellitus     Type 2  . Hyperlipidemia   . Diabetic neuropathy   . PAF (paroxysmal atrial fibrillation)     a. Failed medical therapy with amiodarone; not a candidate for class IC meds due to her CAD. No Multaq due to CHF hx; QT prolonged with amiodarone; Not able to be ablated due to valve disease  . CVA (cerebral vascular accident) 2004  . Hypothyroidism   . Chronic anemia   . Diastolic dysfunction     per echo in May of 2012  . GERD (gastroesophageal reflux disease)   . Arthritis   . Memory loss, short term   . COPD (chronic obstructive pulmonary disease)   . Blood transfusion without reported diagnosis   . Chronic combined systolic and diastolic CHF (congestive heart failure)     a. 05/2012 Echo: EF 30%, diff HK, Mild AS, MR, Mod TR, PASP .  . Moderate mitral regurgitation     a. mod by TEE 2012, mild by echo 2013  . Severe tricuspid regurgitation     a. Severe by TEE 2012, mod by echo 2013  . Atrial flutter     a. 05/2012 -> rate controlled, coumadin resumed.    PHYSICAL EXAM Filed Vitals:   07/12/12 0900 07/12/12 1000 07/12/12 1200  07/12/12 1300  BP:  117/66 118/62 105/33  Pulse:  49 74 48  Temp:   97.1 F (36.2 C)   TempSrc:   Axillary   Resp: 19 23 20 25   Height:      Weight:      SpO2:  97% 97% 95%    Well developed and nourished in no acute distress HENT normal Neck supple with JVP 8-10Clear Regular rate and rhythm, no murmurs or gallops Abd-soft with active BS No Clubbing cyanosis 1+ edema Skin-warm and dry A & Oriented  Grossly normal sensory and motor function  TELEMETRY: Reviewed telemetry pt in ATach with variable conduction    Intake/Output Summary (Last 24 hours) at 07/12/12 1338 Last data filed at 07/12/12 1300  Gross per 24 hour  Intake 996.84 ml  Output   2325 ml  Net -1328.16 ml    LABS: Basic Metabolic Panel:  Lab 07/12/12 1610 07/11/12 1537  NA 133* 137  K 3.9 3.8  CL 98 101  CO2 27 28  GLUCOSE 312* 123*  BUN 28* 25*  CREATININE 1.59* 1.60*  CALCIUM 8.6 9.3  MG -- --  PHOS -- --   Cardiac Enzymes:  Basename 07/12/12 0230 07/11/12 2057 07/11/12 1537  CKTOTAL -- -- --  CKMB -- -- --  CKMBINDEX -- -- --  TROPONINI <0.30 <0.30 <0.30   CBC:  Lab 07/11/12 1537  WBC 8.5  NEUTROABS 5.2  HGB 10.7*  HCT 33.1*  MCV 90.9  PLT 208   PROTIME:  Basename 07/12/12 0231 07/11/12 1537  LABPROT 32.7* 33.4*  INR 3.44* 3.54*   Liver Function Tests: No results found for this basename: AST:2,ALT:2,ALKPHOS:2,BILITOT:2,PROT:2,ALBUMIN:2 in the last 72 hours No results found for this basename: LIPASE:2,AMYLASE:2 in the last 72 hours BNP: BNP (last 3 results)  Basename 07/11/12 1537 05/26/12 0147  PROBNP 2171.0* 2740.0*      ASSESSMENT AND PLAN:  Patient Active Hospital Problem List: Acute on chronic combined systolic and diastolic congestive heart failure, NYHA class 4 (07/12/2012)   Atrial tachycardia (05/30/2011)   Hypothyroidism (11/26/2010)   CAD (coronary artery disease) (11/26/2010)   CKD (chronic kidney disease) stage 3, GFR 30-59 ml/min (10/23/2011)     Diabetes mellitus ()   Cardiomyopathy-cause unknown ? rate related v ischemic (07/12/2012)   There are multiple issues that are unresolved assoc with Atrial arrhythmias with RVR and CHF, now with significant worsening of LVEF cause not clear, rate v ischemic v other, RH failure last year assoc with RV dysfunction and torrential TR now not as evident,;  She also has mod renal insufficiency  I think it is reasonable to pursue AV ablation and pacing for control of arrhyhtmia esp in the absence of drug options.  Reassessment of LV function 6-8 weeks should either implicate rate or not as the cause, but with pretest probabilities being what they are, CRT-P is reasonable choice.  We will plan to proceed on Monday as INR drifts down.  I will also initiate dig for augmented rate control, with care given warfarin and renal dysfunction  Will defer to Dr Garth Bigness the initiation of ACE but would like to wait until after CRT which will likely require some contrast  I have spent >60 min reviewing this with pt, Dr Garth Bigness and Pts family   Will reassess in am  Signed, Sherryl Manges MD  07/12/2012

## 2012-07-13 ENCOUNTER — Inpatient Hospital Stay (HOSPITAL_COMMUNITY): Payer: Medicare Other

## 2012-07-13 DIAGNOSIS — I5043 Acute on chronic combined systolic (congestive) and diastolic (congestive) heart failure: Secondary | ICD-10-CM

## 2012-07-13 DIAGNOSIS — I498 Other specified cardiac arrhythmias: Secondary | ICD-10-CM

## 2012-07-13 LAB — GLUCOSE, CAPILLARY
Glucose-Capillary: 266 mg/dL — ABNORMAL HIGH (ref 70–99)
Glucose-Capillary: 284 mg/dL — ABNORMAL HIGH (ref 70–99)

## 2012-07-13 LAB — COMPREHENSIVE METABOLIC PANEL
ALT: 14 U/L (ref 0–35)
AST: 23 U/L (ref 0–37)
CO2: 26 mEq/L (ref 19–32)
Calcium: 8.6 mg/dL (ref 8.4–10.5)
Chloride: 100 mEq/L (ref 96–112)
GFR calc non Af Amer: 29 mL/min — ABNORMAL LOW (ref >90)
Sodium: 136 mEq/L (ref 135–145)

## 2012-07-13 LAB — PROTIME-INR: INR: 1.83 — ABNORMAL HIGH (ref 0.00–1.49)

## 2012-07-13 LAB — HEPARIN LEVEL (UNFRACTIONATED): Heparin Unfractionated: 0.32 IU/mL (ref 0.30–0.70)

## 2012-07-13 MED ORDER — SODIUM CHLORIDE 0.9 % IJ SOLN
10.0000 mL | INTRAMUSCULAR | Status: DC | PRN
  Administered 2012-07-21 – 2012-07-22 (×5): 10 mL
  Administered 2012-07-22: 20 mL
  Administered 2012-07-22 – 2012-07-23 (×3): 10 mL
  Administered 2012-07-24 – 2012-07-25 (×4): 20 mL
  Administered 2012-07-26: 10 mL

## 2012-07-13 MED ORDER — FUROSEMIDE 40 MG PO TABS
40.0000 mg | ORAL_TABLET | Freq: Two times a day (BID) | ORAL | Status: DC
  Administered 2012-07-13: 40 mg via ORAL
  Filled 2012-07-13 (×3): qty 1

## 2012-07-13 MED ORDER — DILTIAZEM HCL 60 MG PO TABS
60.0000 mg | ORAL_TABLET | Freq: Four times a day (QID) | ORAL | Status: DC
  Administered 2012-07-13 – 2012-07-14 (×5): 60 mg via ORAL
  Filled 2012-07-13 (×9): qty 1

## 2012-07-13 MED ORDER — SODIUM CHLORIDE 0.9 % IJ SOLN
10.0000 mL | Freq: Two times a day (BID) | INTRAMUSCULAR | Status: DC
  Administered 2012-07-13 – 2012-07-26 (×14): 10 mL

## 2012-07-13 MED ORDER — HEPARIN (PORCINE) IN NACL 100-0.45 UNIT/ML-% IJ SOLN
950.0000 [IU]/h | INTRAMUSCULAR | Status: DC
  Administered 2012-07-13: 1200 [IU]/h via INTRAVENOUS
  Administered 2012-07-15: 900 [IU]/h via INTRAVENOUS
  Administered 2012-07-15: 1200 [IU]/h via INTRAVENOUS
  Filled 2012-07-13 (×5): qty 250

## 2012-07-13 NOTE — Progress Notes (Signed)
Inpatient Diabetes Program Recommendations  AACE/ADA: New Consensus Statement on Inpatient Glycemic Control (2013)  Target Ranges:  Prepandial:   less than 140 mg/dL      Peak postprandial:   less than 180 mg/dL (1-2 hours)      Critically ill patients:  140 - 180 mg/dL   Reason for Visit: CBGs 1/9  [phone] mg/dl        1/61  096-045 mg/dl  Inpatient Diabetes Program Recommendations Insulin - Basal: Increase Lantus to 30 units BID if CBGs continue greater than 180 mg/dl. Correction (SSI): .  Note: Continue Novolog MODERATE correction scale TID.

## 2012-07-13 NOTE — Progress Notes (Signed)
Patient Name: [name] Date of Encounter: 07/13/2012  Principal Problem:  *Acute on chronic combined systolic and diastolic congestive heart failure, NYHA class 4 Active Problems:  Hypothyroidism  CAD (coronary artery disease)  Atrial tachycardia  CKD (chronic kidney disease) stage 3, GFR 30-59 ml/min  Diabetes mellitus  Cardiomyopathy-cause unknown ? rate related v ischemic    SUBJECTIVE: Breathing better today, no palpitations yesterday or overnight (even though in/out atrial fib). Feels she is doing better.  OBJECTIVE Filed Vitals:   07/13/12 0300 07/13/12 0400 07/13/12 0500 07/13/12 0600  BP: 111/56 112/58  117/66  Pulse: 73 73 72 72  Temp:  98.7 F (37.1 C)    TempSrc:  Oral    Resp: 17 17 14 17   Height:      Weight:   222 lb 3.6 oz (100.8 kg)   SpO2: 97% 97% 97% 97%    Intake/Output Summary (Last 24 hours) at 07/13/12 0707 Last data filed at 07/13/12 0600  Gross per 24 hour  Intake 1121.75 ml  Output   2375 ml  Net -1253.25 ml   Filed Weights   07/11/12 1454 07/12/12 0500 07/13/12 0500  Weight: 221 lb 12.5 oz (100.6 kg) 221 lb 9 oz (100.5 kg) 222 lb 3.6 oz (100.8 kg)     PHYSICAL EXAM General: Well developed, well nourished, female in no acute distress. Head: Normocephalic, atraumatic.  Neck: Supple without bruits, JVD 8 cm. Lungs:  Resp regular and unlabored, decreased BS right base, few rales left. Heart: RRR, S1, S2, no S3, S4, 3/6 murmur. Abdomen: Soft, non-tender, non-distended, BS + x 4.  Extremities: No clubbing, cyanosis, trace edema.  Neuro: Alert and oriented X 3. Moves all extremities spontaneously. Psych: Normal affect.  LABS: CBC:  Basename 07/11/12 1537  WBC 8.5  NEUTROABS 5.2  HGB 10.7*  HCT 33.1*  MCV 90.9  PLT 208   INR:  Basename 07/13/12 0500  INR 1.83*   Basic Metabolic Panel:  Basename 07/13/12 0500 07/12/12 0231  NA 136 133*  K 3.8 3.9  CL 100 98  CO2 26 27  GLUCOSE 255* 312*  BUN 29* 28*    CREATININE 1.65* 1.59*  CALCIUM 8.6 8.6  MG -- --  PHOS -- --   Liver Function Tests:  Basename 07/13/12 0500  AST 23  ALT 14  ALKPHOS 111  BILITOT 0.4  PROT 7.3  ALBUMIN 2.7*   Cardiac Enzymes:  Basename 07/12/12 0230 07/11/12 2057 07/11/12 1537  CKTOTAL -- -- --  CKMB -- -- --  CKMBINDEX -- -- --  TROPONINI <0.30 <0.30 <0.30   No results found for this basename: TROPIPOC:2 in the last 72 hours BNP: Pro B Natriuretic peptide (BNP)  Date/Time Value Range Status  07/11/2012  3:37 PM 2171.0* 0 - 450 pg/mL Final  05/26/2012  1:47 AM 2740.0* 0 - 450 pg/mL Final   TELE:  Ectopic atrial rhythm      Radiology/Studies: Dg Chest Port 1 View 07/12/2012  *RADIOLOGY REPORT*  Clinical Data: CHF.  Short of breath.  PORTABLE CHEST - 1 VIEW  Comparison: 05/25/2012  Findings: Heart remains moderately enlarged.  Right pleural effusion and basilar pulmonary parenchymal opacity have developed. Minimal atelectasis at the left base.  Normal vascularity.  No pneumothorax.  IMPRESSION: Cardiomegaly.  Right pleural effusion and basilar consolidation.   Original Report Authenticated By: Jolaine Click, M.D.     Current Medications:     . amitriptyline  75 mg Oral QHS  . aspirin EC  81 mg Oral Daily  . atorvastatin  40 mg Oral QPM  . diltiazem  10 mg Intravenous Once  . donepezil  5 mg Oral QHS  . furosemide  40 mg Intravenous BID  . insulin aspart  0-15 Units Subcutaneous TID WC  . insulin glargine  25 Units Subcutaneous BID  . levothyroxine  100 mcg Oral QAC breakfast  . metoprolol succinate  50 mg Oral BID  . pantoprazole  80 mg Oral Q1200  . potassium chloride SA  20 mEq Oral BID  . sodium chloride  3 mL Intravenous Q12H      . diltiazem (CARDIZEM) infusion 10 mg/hr (07/13/12 0518)    ASSESSMENT AND PLAN: 1. Atrial flutter with rapid ventricular response: Rate was elevated since admission despite rate control therapy with metoprolol and diltiazem IV. Patient is not a candidate for  antiarrhythmic drug therapy as noted above. She has been deemed a poor candidate for atrial fibrillation ablation. Dr Ladona Ridgel with EP saw 1/8 and felt she would be a candidate for AV node ablation and BiV PPM as optimal long-term therapy. MD advise if can do PPM today, will make NPO after breakfast per Dr Ladona Ridgel.  2. Acute on chronic systolic congestive heart failure. Weight had gone up 16 pounds over the past 2 weeks. I/O are negative and she is breathing better, wt up 1 lb. She is on Lasix 40 IV BID. Echocardiogram performed in November showed an ejection fraction of 30% with global hypokinesis. Hx diastolic dysfunction but did not see on echo report, MD advise. LFTs OK, BUN/Cr trending up, CXR is improved but still with right pleural effusion, MD advise on decubitus film. With borderline BP and renal function, hold Lasix till MD sees.  3. Coronary disease status post CABG in December of 2010. Recent Myoview study showed a low risk study with a small area of reversible ischemia in the distal lateral wall. Currently without angina.   4. Chronic kidney disease stage III. BUN/Cr trending up slightly, follow.   5. 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 Hypertension - good control on current Rx   7. Moderate tricuspid insufficiency and mild mitral insufficiency - see echo report   8. Chronic memory loss - follow, lives with husband   9. Remote CVA in 2004 - follow   10. Chronic anticoagulation - s/p Vit K 1 mg subcu 1/8, coum held, INR sub-therapeutic, will add heparin.  11. Anemia, chronic - FOB has been + in past with nl iron/ferritin, continue to follow, guiac stools here   Otherwise, continue current therapy.  Principal Problem:  *Acute on chronic combined systolic and diastolic congestive heart failure, NYHA class 4 Active Problems:  Hypothyroidism  CAD (coronary artery disease)  Atrial tachycardia  CKD (chronic kidney disease) stage 3, GFR  30-59 ml/min  Diabetes mellitus  Cardiomyopathy-cause unknown ? rate related v ischemic   Signed, Theodore Demark , PA-C 7:07 AM 07/13/2012 Patient seen and examined and history reviewed. Agree with above findings and plan. Patient in ectopic atrial rhythm with variable ventricular response. Pulse increases with activity when she goes 1:1. Good diuresis by I/O but weight stable. Edema clearly improved. I think pleural effusion is due to CHF and given improvement would not recommend decubitus film or tap. Will transition lasix to po. IV access is an issue. Will DC IV diltiazem and switch to po 60 mg qid. Patient had severe TR by TEE 11/12. Echo 05/28/12 showed moderate TR. Discussed  with Dr. Ladona Ridgel- may be able to proceed with biV pacer today depending on schedule. Will cover with IV heparin for now.  Theron Arista [facility] 07/13/2012 8:16 AM

## 2012-07-13 NOTE — Progress Notes (Signed)
Peripherally Inserted Central Catheter/Midline Placement  The IV Nurse has discussed with the patient and/or persons authorized to consent for the patient, the purpose of this procedure and the potential benefits and risks involved with this procedure.  The benefits include less needle sticks, lab draws from the catheter and patient may be discharged home with the catheter.  Risks include, but not limited to, infection, bleeding, blood clot (thrombus formation), and puncture of an artery; nerve damage and irregular heat beat.  Alternatives to this procedure were also discussed.  PICC/Midline Placement Documentation        [name] 07/13/2012, 11:02 AM

## 2012-07-13 NOTE — Progress Notes (Signed)
ANTICOAGULATION CONSULT NOTE - Initial Consult  Pharmacy Consult for Heparin Indication: atrial fibrillation  Allergies  Allergen Reactions  . Betadine (Povidone Iodine) Itching  . Capoten (Captopril) Other (See Comments)    unknown  . Methadone   . Penicillins Swelling  . Propoxyphene And Methadone     Intolerance to darvocet    Patient Measurements: Height: 5\' 3"  (160 cm) Weight: 222 lb 3.6 oz (100.8 kg) IBW/kg (Calculated) : 52.4  Heparin dosing weight: 76 kg   Vital Signs: Temp: 98.7 F (37.1 C) (01/10 0400) Temp src: Oral (01/10 0400) BP: 117/66 mmHg (01/10 0600) Pulse Rate: 72  (01/10 0600)  Labs:  Basename 07/13/12 0500 07/12/12 0231 07/12/12 0230 07/11/12 2057 07/11/12 1537  HGB -- -- -- -- 10.7*  HCT -- -- -- -- 33.1*  PLT -- -- -- -- 208  APTT -- -- -- -- --  LABPROT 20.5* 32.7* -- -- 33.4*  INR 1.83* 3.44* -- -- 3.54*  HEPARINUNFRC -- -- -- -- --  CREATININE 1.65* 1.59* -- -- 1.60*  CKTOTAL -- -- -- -- --  CKMB -- -- -- -- --  TROPONINI -- -- <0.30 <0.30 <0.30    Estimated Creatinine Clearance: 32.4 ml/min (by C-G formula based on Cr of 1.65).   Medical History: Past Medical History  Diagnosis Date  . Coronary artery disease     a. Severe two vessel; s/p CABG;  b. 05/2012 NSTEMI in setting of rapid aflutter  . Hypertension   . Diabetes mellitus     Type 2  . Hyperlipidemia   . Diabetic neuropathy   . PAF (paroxysmal atrial fibrillation)     a. Failed medical therapy with amiodarone; not a candidate for class IC meds due to her CAD. No Multaq due to CHF hx; QT prolonged with amiodarone; Not able to be ablated due to valve disease  . CVA (cerebral vascular accident) 2004  . Hypothyroidism   . Chronic anemia   . Diastolic dysfunction     per echo in May of 2012  . GERD (gastroesophageal reflux disease)   . Arthritis   . Memory loss, short term   . COPD (chronic obstructive pulmonary disease)   . Blood transfusion without reported diagnosis    . Chronic combined systolic and diastolic CHF (congestive heart failure)     a. 05/2012 Echo: EF 30%, diff HK, Mild AS, MR, Mod TR, PASP .  . Moderate mitral regurgitation     a. mod by TEE 2012, mild by echo 2013  . Severe tricuspid regurgitation     a. Severe by TEE 2012, mod by echo 2013  . Atrial flutter     a. 05/2012 -> rate controlled, coumadin resumed.   Assessment: 58 YOF Admitted from the office for CHF exacerbation and atrial flutter with rapid response, possible AV node ablation and BiV PPM. Has been on Coumadin at home, INR 3.54 on admission, s/p vitamin K 1mg  sq on 1/8 today.   Coumadin PTA dose: 7.5 mg daily except 5 mg on Tues & Fri. Per [facility] note INR goal 2-2.5.  INR (1.83) is now below-goal. Pharmacy to manage IV heparin.   Goal of Therapy:  INR 2-2.5 per [facility] note Heparin level 0.3-0.7 Monitor platelets by anticoagulation protocol: Yes   Plan:  1. Heparin IV infusion at 1200 units/hr.  2. Heparin level in 8 hours. 3. Daily CBC, heparin level.  Lorre Munroe, PharmD 07/13/2012,7:13 AM

## 2012-07-13 NOTE — Progress Notes (Signed)
ANTICOAGULATION CONSULT NOTE   Pharmacy Consult for Heparin Indication: atrial fibrillation  Allergies  Allergen Reactions  . Betadine (Povidone Iodine) Itching  . Capoten (Captopril) Other (See Comments)    unknown  . Methadone   . Penicillins Swelling  . Propoxyphene And Methadone     Intolerance to darvocet    Patient Measurements: Height: 5\' 3"  (160 cm) Weight: 222 lb 3.6 oz (100.8 kg) IBW/kg (Calculated) : 52.4  Heparin dosing weight: 76 kg   Vital Signs: Temp: 97.4 F (36.3 C) (01/10 1200) Temp src: Oral (01/10 1200) BP: 120/66 mmHg (01/10 1800) Pulse Rate: 73  (01/10 1800)  Labs:  Basename 07/13/12 1843 07/13/12 0500 07/12/12 0231 07/12/12 0230 07/11/12 2057 07/11/12 1537  HGB -- -- -- -- -- 10.7*  HCT -- -- -- -- -- 33.1*  PLT -- -- -- -- -- 208  APTT -- -- -- -- -- --  LABPROT -- 20.5* 32.7* -- -- 33.4*  INR -- 1.83* 3.44* -- -- 3.54*  HEPARINUNFRC 0.32 -- -- -- -- --  CREATININE -- 1.65* 1.59* -- -- 1.60*  CKTOTAL -- -- -- -- -- --  CKMB -- -- -- -- -- --  TROPONINI -- -- -- <0.30 <0.30 <0.30    Estimated Creatinine Clearance: 32.4 ml/min (by C-G formula based on Cr of 1.65).   Medical History: Past Medical History  Diagnosis Date  . Coronary artery disease     a. Severe two vessel; s/p CABG;  b. 05/2012 NSTEMI in setting of rapid aflutter  . Hypertension   . Diabetes mellitus     Type 2  . Hyperlipidemia   . Diabetic neuropathy   . PAF (paroxysmal atrial fibrillation)     a. Failed medical therapy with amiodarone; not a candidate for class IC meds due to her CAD. No Multaq due to CHF hx; QT prolonged with amiodarone; Not able to be ablated due to valve disease  . CVA (cerebral vascular accident) 2004  . Hypothyroidism   . Chronic anemia   . Diastolic dysfunction     per echo in May of 2012  . GERD (gastroesophageal reflux disease)   . Arthritis   . Memory loss, short term   . COPD (chronic obstructive pulmonary disease)   . Blood  transfusion without reported diagnosis   . Chronic combined systolic and diastolic CHF (congestive heart failure)     a. 05/2012 Echo: EF 30%, diff HK, Mild AS, MR, Mod TR, PASP .  . Moderate mitral regurgitation     a. mod by TEE 2012, mild by echo 2013  . Severe tricuspid regurgitation     a. Severe by TEE 2012, mod by echo 2013  . Atrial flutter     a. 05/2012 -> rate controlled, coumadin resumed.   Assessment: 43 YOF Admitted from the office for CHF exacerbation and atrial flutter with rapid response, possible AV node ablation and BiV PPM. Has been on Coumadin at home, INR 3.54 on admission, s/p vitamin K 1mg  sq on 1/8 today.   Coumadin PTA dose: 7.5 mg daily except 5 mg on Tues & Fri. Per [facility] note INR goal 2-2.5.  INR (1.83) is now below-goal. Pharmacy to manage IV heparin. Heparin level is therapeutic.  Goal of Therapy:  INR 2-2.5 per [facility] note Heparin level 0.3-0.7 Monitor platelets by anticoagulation protocol: Yes   Plan:  1. Cont heparin IV infusion at 1200 units/hr.  2. F/u am labs   Talbert Cage, PharmD 07/13/2012,7:33 PM

## 2012-07-14 LAB — PROTIME-INR
INR: 1.47 (ref 0.00–1.49)
Prothrombin Time: 17.4 seconds — ABNORMAL HIGH (ref 11.6–15.2)

## 2012-07-14 LAB — PRO B NATRIURETIC PEPTIDE: Pro B Natriuretic peptide (BNP): 2025 pg/mL — ABNORMAL HIGH (ref 0–450)

## 2012-07-14 LAB — BASIC METABOLIC PANEL
BUN: 26 mg/dL — ABNORMAL HIGH (ref 6–23)
Calcium: 8.9 mg/dL (ref 8.4–10.5)
Chloride: 101 mEq/L (ref 96–112)
Creatinine, Ser: 1.53 mg/dL — ABNORMAL HIGH (ref 0.50–1.10)
GFR calc Af Amer: 37 mL/min — ABNORMAL LOW (ref >90)

## 2012-07-14 LAB — CBC
HCT: 31.9 % — ABNORMAL LOW (ref 36.0–46.0)
Hemoglobin: 10.1 g/dL — ABNORMAL LOW (ref 12.0–15.0)
MCH: 28.9 pg (ref 26.0–34.0)
MCHC: 31.7 g/dL (ref 30.0–36.0)
RDW: 15.5 % (ref 11.5–15.5)

## 2012-07-14 LAB — GLUCOSE, CAPILLARY
Glucose-Capillary: 124 mg/dL — ABNORMAL HIGH (ref 70–99)
Glucose-Capillary: 161 mg/dL — ABNORMAL HIGH (ref 70–99)

## 2012-07-14 LAB — HEPARIN LEVEL (UNFRACTIONATED): Heparin Unfractionated: 0.69 IU/mL (ref 0.30–0.70)

## 2012-07-14 MED ORDER — POTASSIUM CHLORIDE 20 MEQ PO PACK
40.0000 meq | PACK | Freq: Once | ORAL | Status: DC
  Filled 2012-07-14: qty 2

## 2012-07-14 MED ORDER — POTASSIUM CHLORIDE CRYS ER 20 MEQ PO TBCR
40.0000 meq | EXTENDED_RELEASE_TABLET | Freq: Once | ORAL | Status: AC
  Administered 2012-07-14: 40 meq via ORAL

## 2012-07-14 MED ORDER — DILTIAZEM HCL 30 MG PO TABS
30.0000 mg | ORAL_TABLET | Freq: Four times a day (QID) | ORAL | Status: DC
  Administered 2012-07-14 – 2012-07-15 (×4): 30 mg via ORAL
  Filled 2012-07-14 (×8): qty 1

## 2012-07-14 MED ORDER — METOPROLOL SUCCINATE ER 50 MG PO TB24
75.0000 mg | ORAL_TABLET | Freq: Two times a day (BID) | ORAL | Status: DC
  Administered 2012-07-14 (×2): 75 mg via ORAL
  Filled 2012-07-14 (×4): qty 1

## 2012-07-14 MED ORDER — FUROSEMIDE 10 MG/ML IJ SOLN
40.0000 mg | Freq: Two times a day (BID) | INTRAMUSCULAR | Status: DC
  Administered 2012-07-14 – 2012-07-15 (×3): 40 mg via INTRAVENOUS
  Filled 2012-07-14 (×4): qty 4

## 2012-07-14 NOTE — Plan of Care (Signed)
Problem: Phase II Progression Outcomes Goal: Pain controlled Outcome: Progressing C/o H/A this shift . Denies : chest pain,pressure,tightness Goal: Dyspnea controlled with activity Outcome: Progressing DOE still present Goal: Walk in hall or up in chair TID Outcome: Not Progressing Unable to get OOB at this time due to HR increasing while repositioning in bed.

## 2012-07-14 NOTE — Progress Notes (Signed)
ANTICOAGULATION CONSULT NOTE - Follow Up Consult  Pharmacy Consult for warfarin and heparin Indication: atrial fibrillation  Allergies  Allergen Reactions  . Betadine (Povidone Iodine) Itching  . Capoten (Captopril) Other (See Comments)    unknown  . Methadone   . Penicillins Swelling  . Propoxyphene And Methadone     Intolerance to darvocet    Patient Measurements: Height: 5\' 3"  (160 cm) Weight: 222 lb 3.6 oz (100.8 kg) IBW/kg (Calculated) : 52.4  Heparin Dosing Weight: 76 kg  Vital Signs: Temp: 96.2 F (35.7 C) (01/11 0800) Temp src: Axillary (01/11 0800) BP: 88/63 mmHg (01/11 0800) Pulse Rate: 73  (01/11 0800)  Labs:  Basename 07/14/12 0608 07/13/12 1843 07/13/12 0500 07/12/12 0231 07/12/12 0230 07/11/12 2057 07/11/12 1537  HGB 10.1* -- -- -- -- -- 10.7*  HCT 31.9* -- -- -- -- -- 33.1*  PLT 216 -- -- -- -- -- 208  APTT -- -- -- -- -- -- --  LABPROT 17.4* -- 20.5* 32.7* -- -- --  INR 1.47 -- 1.83* 3.44* -- -- --  HEPARINUNFRC 0.69 0.32 -- -- -- -- --  CREATININE 1.53* -- 1.65* 1.59* -- -- --  CKTOTAL -- -- -- -- -- -- --  CKMB -- -- -- -- -- -- --  TROPONINI -- -- -- -- <0.30 <0.30 <0.30    Estimated Creatinine Clearance: 34.9 ml/min (by C-G formula based on Cr of 1.53).   Medications:  Scheduled:    . amitriptyline  75 mg Oral QHS  . aspirin EC  81 mg Oral Daily  . atorvastatin  40 mg Oral QPM  . diltiazem  30 mg Oral Q6H  . donepezil  5 mg Oral QHS  . furosemide  40 mg Intravenous BID  . insulin aspart  0-15 Units Subcutaneous TID WC  . insulin glargine  25 Units Subcutaneous BID  . levothyroxine  100 mcg Oral QAC breakfast  . metoprolol succinate  75 mg Oral BID  . pantoprazole  80 mg Oral Q1200  . potassium chloride SA  20 mEq Oral BID  . [COMPLETED] potassium chloride  40 mEq Oral Once  . sodium chloride  10-40 mL Intracatheter Q12H  . sodium chloride  3 mL Intravenous Q12H  . [DISCONTINUED] diltiazem  60 mg Oral Q6H  . [DISCONTINUED]  furosemide  40 mg Oral BID  . [DISCONTINUED] metoprolol succinate  50 mg Oral BID  . [DISCONTINUED] potassium chloride  40 mEq Oral Once    Assessment: 27 YOF with history of Afib who presented from the office with a CHF exacerbation. Her INR was elevated at admission, is now s/p vitamin K 1mg  sq on 1/8. INR fell to 1.8 and heparin was started. Patient to go for AV node ablation and BiV PPM on Monday 1/13. INR this morning 1.47 and heparin level 0.69 on 1200 units/hr. Hemoglobin is stable at 10.1 and platelets are 216. No bleeding noted.  Goal of Therapy:  Heparin level 0.3-0.7 units/ml Monitor platelets by anticoagulation protocol: Yes   Plan:  1. Continue to hold warfarin 2. Continue heparin at 1200 units/hr 3. Daily PT/INR and heparin level 4. Daily CBC 5. Follow up plan after Monday's procedure  Brynlynn Walko D. Carianna Lague, PharmD Clinical Pharmacist Pager: [phone] 07/14/2012 9:45 AM

## 2012-07-14 NOTE — Progress Notes (Signed)
Patient ID: [name], female   DOB: [birth year], 79 y.o.   MRN: [id number]    SUBJECTIVE: HR controlled this am, in the 70s in an ectopic atrial rhythm.  She has a cough and dyspnea if she moves around any.  Comfortable at rest.      . amitriptyline  75 mg Oral QHS  . aspirin EC  81 mg Oral Daily  . atorvastatin  40 mg Oral QPM  . diltiazem  30 mg Oral Q6H  . donepezil  5 mg Oral QHS  . furosemide  40 mg Intravenous BID  . insulin aspart  0-15 Units Subcutaneous TID WC  . insulin glargine  25 Units Subcutaneous BID  . levothyroxine  100 mcg Oral QAC breakfast  . metoprolol succinate  75 mg Oral BID  . pantoprazole  80 mg Oral Q1200  . potassium chloride  40 mEq Oral Once  . potassium chloride SA  20 mEq Oral BID  . sodium chloride  10-40 mL Intracatheter Q12H  . sodium chloride  3 mL Intravenous Q12H  heparin gtt   Filed Vitals:   07/14/12 0500 07/14/12 0600 07/14/12 0700 07/14/12 0800  BP: 115/66 112/70 116/69 88/63  Pulse: 71 68 71 73  Temp:    96.2 F (35.7 C)  TempSrc:    Axillary  Resp: 17 20 15 20   Height:      Weight:      SpO2: 98% 99% 100% 99%    Intake/Output Summary (Last 24 hours) at 07/14/12 0822 Last data filed at 07/14/12 0700  Gross per 24 hour  Intake   1128 ml  Output   2320 ml  Net  -1192 ml    LABS: Basic Metabolic Panel:  Basename 07/14/12 0608 07/13/12 0500  NA 137 136  K 4.0 3.8  CL 101 100  CO2 26 26  GLUCOSE 143* 255*  BUN 26* 29*  CREATININE 1.53* 1.65*  CALCIUM 8.9 8.6  MG -- --  PHOS -- --   Liver Function Tests:  Basename 07/13/12 0500  AST 23  ALT 14  ALKPHOS 111  BILITOT 0.4  PROT 7.3  ALBUMIN 2.7*   No results found for this basename: LIPASE:2,AMYLASE:2 in the last 72 hours CBC:  Basename 07/14/12 0608 07/11/12 1537  WBC 7.5 8.5  NEUTROABS -- 5.2  HGB 10.1* 10.7*  HCT 31.9* 33.1*  MCV 91.1 90.9  PLT 216 208   Cardiac Enzymes:  Basename 07/12/12 0230 07/11/12 2057 07/11/12 1537  CKTOTAL -- -- --   CKMB -- -- --  CKMBINDEX -- -- --  TROPONINI <0.30 <0.30 <0.30   BNP: No components found with this basename: POCBNP:3 D-Dimer: No results found for this basename: DDIMER:2 in the last 72 hours Hemoglobin A1C: No results found for this basename: HGBA1C in the last 72 hours Fasting Lipid Panel: No results found for this basename: CHOL,HDL,LDLCALC,TRIG,CHOLHDL,LDLDIRECT in the last 72 hours Thyroid Function Tests: No results found for this basename: TSH,T4TOTAL,FREET3,T3FREE,THYROIDAB in the last 72 hours Anemia Panel: No results found for this basename: VITAMINB12,FOLATE,FERRITIN,TIBC,IRON,RETICCTPCT in the last 72 hours  RADIOLOGY: Dg Chest Port 1 View  07/13/2012  *RADIOLOGY REPORT*  Clinical Data: History of line placement.  PORTABLE CHEST - 1 VIEW  Comparison: 07/12/2012.  Findings: Tip of right brachiocephalic venous catheter terminates in the superior vena cava lower portion.  No pneumothorax is evident.  Left lung is free of infiltrates.  There is hazy opacity in the inferior right lung with element of atelectasis and  associated right pleural effusion.  There is stable cardiac silhouette enlargement. The patient has undergone previous median sternotomy and coronary artery bypass grafting.  IMPRESSION: Tip of right brachiocephalic venous catheter terminates in the superior vena cava lower portion.  No pneumothorax is evident. Stable cardiac silhouette enlargement.  Hazy opacity inferiorly on the right with atelectasis and associated right pleural effusion without significant change.   Original Report Authenticated By: Onalee Hua Call     PHYSICAL EXAM General: NAD Neck: JVP 12 cm, no thyromegaly or thyroid nodule.  Lungs: crackles dependently CV: Nondisplaced PMI.  Heart regular S1/S2, no S3/S4, no murmur.  1+ edema 1/2 to knees bilaterally. No carotid bruit.  Abdomen: Soft, nontender, no hepatosplenomegaly, no distention.  Neurologic: Alert and oriented x 3.  Psych: Normal  affect. Extremities: No clubbing or cyanosis.   TELEMETRY: Reviewed telemetry pt in ectopic atrial rhythm, rate controlled  ASSESSMENT AND PLAN: 79 yo with history of CAD and paroxysmal atrial fibrillation now with an ectopic atrial rhythm on occasion with RVR that has been difficult to control.  EF 30% on last echo, diffuse hypokinesis.  1. Atrial tachycardia: Currently rate controlled but rhythm has been difficult to manage.  Plan for AV nodal ablation with BiV pacing on Monday.   - Continue heparin gtt for now (history of atrial fibrillation) - Given low EF will try to transition off diltiazem and increase Toprol XL.  Will decrease dilt to 30 and increase Toprol XL to 75.  2. CHF: Acute on chronic systolic CHF, EF 16% on echo.  She is still dyspneic and volume overloaded.  Will change her back to Lasix 40 mg IV bid.  Creatinine improved this morning.  Will hold off on ACEI for right now with elevated creatinine.  If BP remains stable, will try to transition onto Bidil.  3. CKD: Creatinine improved this morning, follow with diuresis.   Marca Ancona 07/14/2012 8:28 AM

## 2012-07-15 LAB — CBC
HCT: 31.8 % — ABNORMAL LOW (ref 36.0–46.0)
Hemoglobin: 10 g/dL — ABNORMAL LOW (ref 12.0–15.0)
MCH: 28.7 pg (ref 26.0–34.0)
MCHC: 31.4 g/dL (ref 30.0–36.0)
MCV: 91.4 fL (ref 78.0–100.0)
Platelets: 226 10*3/uL (ref 150–400)
RBC: 3.48 MIL/uL — ABNORMAL LOW (ref 3.87–5.11)
RDW: 15.6 % — ABNORMAL HIGH (ref 11.5–15.5)
WBC: 8.2 10*3/uL (ref 4.0–10.5)

## 2012-07-15 LAB — BASIC METABOLIC PANEL WITH GFR
BUN: 26 mg/dL — ABNORMAL HIGH (ref 6–23)
CO2: 28 meq/L (ref 19–32)
Calcium: 8.9 mg/dL (ref 8.4–10.5)
Chloride: 100 meq/L (ref 96–112)
Creatinine, Ser: 1.78 mg/dL — ABNORMAL HIGH (ref 0.50–1.10)
GFR calc Af Amer: 31 mL/min — ABNORMAL LOW
GFR calc non Af Amer: 26 mL/min — ABNORMAL LOW
Glucose, Bld: 137 mg/dL — ABNORMAL HIGH (ref 70–99)
Potassium: 3.9 meq/L (ref 3.5–5.1)
Sodium: 138 meq/L (ref 135–145)

## 2012-07-15 LAB — HEPARIN LEVEL (UNFRACTIONATED)
Heparin Unfractionated: 1.18 [IU]/mL — ABNORMAL HIGH (ref 0.30–0.70)
Heparin Unfractionated: 0.72 [IU]/mL — ABNORMAL HIGH (ref 0.30–0.70)

## 2012-07-15 LAB — GLUCOSE, CAPILLARY
Glucose-Capillary: 99 mg/dL (ref 70–99)
Glucose-Capillary: 178 mg/dL — ABNORMAL HIGH (ref 70–99)
Glucose-Capillary: 232 mg/dL — ABNORMAL HIGH (ref 70–99)
Glucose-Capillary: 293 mg/dL — ABNORMAL HIGH (ref 70–99)

## 2012-07-15 LAB — PROTIME-INR
INR: 1.32 (ref 0.00–1.49)
Prothrombin Time: 16.1 seconds — ABNORMAL HIGH (ref 11.6–15.2)

## 2012-07-15 MED ORDER — METOPROLOL SUCCINATE ER 100 MG PO TB24
100.0000 mg | ORAL_TABLET | Freq: Two times a day (BID) | ORAL | Status: DC
  Administered 2012-07-15 – 2012-07-17 (×6): 100 mg via ORAL
  Filled 2012-07-15 (×8): qty 1

## 2012-07-15 MED ORDER — INSULIN GLARGINE 100 UNIT/ML ~~LOC~~ SOLN
25.0000 [IU] | Freq: Two times a day (BID) | SUBCUTANEOUS | Status: DC
  Administered 2012-07-16: 25 [IU] via SUBCUTANEOUS
  Administered 2012-07-17: 12.5 [IU] via SUBCUTANEOUS
  Administered 2012-07-17: 25 [IU] via SUBCUTANEOUS

## 2012-07-15 MED ORDER — ISOSORB DINITRATE-HYDRALAZINE 20-37.5 MG PO TABS
0.5000 | ORAL_TABLET | Freq: Three times a day (TID) | ORAL | Status: DC
  Administered 2012-07-15 – 2012-07-17 (×4): 0.5 via ORAL
  Filled 2012-07-15 (×9): qty 0.5

## 2012-07-15 MED ORDER — SODIUM CHLORIDE 0.9 % IV SOLN: 250.0000 mL | INTRAVENOUS | Status: DC

## 2012-07-15 MED ORDER — DILTIAZEM HCL 30 MG PO TABS
30.0000 mg | ORAL_TABLET | Freq: Four times a day (QID) | ORAL | Status: DC
  Administered 2012-07-15: 30 mg via ORAL
  Filled 2012-07-15 (×3): qty 1

## 2012-07-15 MED ORDER — SODIUM CHLORIDE 0.9 % IR SOLN
80.0000 mg | Status: AC
  Filled 2012-07-15: qty 2

## 2012-07-15 MED ORDER — SIMETHICONE 80 MG PO CHEW
80.0000 mg | CHEWABLE_TABLET | Freq: Four times a day (QID) | ORAL | Status: DC | PRN
  Administered 2012-07-17: 80 mg via ORAL
  Filled 2012-07-15: qty 1

## 2012-07-15 MED ORDER — DILTIAZEM HCL 100 MG IV SOLR
5.0000 mg/h | INTRAVENOUS | Status: DC
  Administered 2012-07-15: 5 mg/h via INTRAVENOUS
  Administered 2012-07-15 – 2012-07-17 (×3): 10 mg/h via INTRAVENOUS
  Filled 2012-07-15 (×3): qty 100

## 2012-07-15 MED ORDER — SODIUM CHLORIDE 0.45 % IV SOLN
INTRAVENOUS | Status: DC
  Administered 2012-07-16: 06:00:00 via INTRAVENOUS

## 2012-07-15 MED ORDER — INSULIN GLARGINE 100 UNIT/ML ~~LOC~~ SOLN
12.0000 [IU] | Freq: Once | SUBCUTANEOUS | Status: AC
  Administered 2012-07-15: 12 [IU] via SUBCUTANEOUS

## 2012-07-15 MED ORDER — CHLORHEXIDINE GLUCONATE 4 % EX LIQD
60.0000 mL | Freq: Once | CUTANEOUS | Status: AC
  Administered 2012-07-15: 4 via TOPICAL
  Filled 2012-07-15: qty 60

## 2012-07-15 MED ORDER — DILTIAZEM LOAD VIA INFUSION
10.0000 mg | Freq: Once | INTRAVENOUS | Status: AC
  Administered 2012-07-15: 10 mg via INTRAVENOUS
  Filled 2012-07-15: qty 10

## 2012-07-15 MED ORDER — SODIUM CHLORIDE 0.9 % IJ SOLN: 3.0000 mL | INTRAMUSCULAR | Status: DC | PRN

## 2012-07-15 MED ORDER — CHLORHEXIDINE GLUCONATE 4 % EX LIQD
60.0000 mL | Freq: Once | CUTANEOUS | Status: AC
  Administered 2012-07-16: 4 via TOPICAL

## 2012-07-15 MED ORDER — ALUM & MAG HYDROXIDE-SIMETH 200-200-20 MG/5ML PO SUSP
30.0000 mL | Freq: Three times a day (TID) | ORAL | Status: DC | PRN
  Administered 2012-07-15: 30 mL via ORAL
  Filled 2012-07-15: qty 30

## 2012-07-15 MED ORDER — SODIUM CHLORIDE 0.9 % IJ SOLN
3.0000 mL | Freq: Two times a day (BID) | INTRAMUSCULAR | Status: DC
  Administered 2012-07-16: 21:00:00 via INTRAVENOUS

## 2012-07-15 MED ORDER — VANCOMYCIN HCL IN DEXTROSE 1-5 GM/200ML-% IV SOLN
1000.0000 mg | INTRAVENOUS | Status: AC
  Filled 2012-07-15: qty 200

## 2012-07-15 NOTE — Progress Notes (Signed)
  Echocardiogram 2D Echocardiogram has been performed.  Kalie Cabral 07/15/2012, 10:02 AM

## 2012-07-15 NOTE — Progress Notes (Signed)
Patient ID: [name], female   DOB: 1932-11-30, 79 y.o.   MRN: [id number]  SUBJECTIVE: HR higher this am, in 100s appearing to be in an ectopic atrial rhythm.  She diuresed well and is breathing better.  Creatinine is higher (1.5 > 1.7).      Marland Kitchen amitriptyline  75 mg Oral QHS  . aspirin EC  81 mg Oral Daily  . atorvastatin  40 mg Oral QPM  . diltiazem  30 mg Oral Q6H  . donepezil  5 mg Oral QHS  . furosemide  40 mg Intravenous BID  . insulin aspart  0-15 Units Subcutaneous TID WC  . insulin glargine  25 Units Subcutaneous BID  . levothyroxine  100 mcg Oral QAC breakfast  . metoprolol succinate  75 mg Oral BID  . pantoprazole  80 mg Oral Q1200  . potassium chloride SA  20 mEq Oral BID  . sodium chloride  10-40 mL Intracatheter Q12H  . sodium chloride  3 mL Intravenous Q12H  heparin gtt   Filed Vitals:   07/15/12 0600 07/15/12 0700 07/15/12 0725 07/15/12 0729  BP: 128/68  134/68   Pulse: 48 48 55 48  Temp:    97.5 F (36.4 C)  TempSrc:      Resp: 19 19 20 17   Height:      Weight:      SpO2: 98% 97% 97% 97%    Intake/Output Summary (Last 24 hours) at 07/15/12 0742 Last data filed at 07/15/12 0700  Gross per 24 hour  Intake 1583.6 ml  Output   3825 ml  Net -2241.4 ml    LABS: Basic Metabolic Panel:  Basename 07/15/12 0414 07/14/12 0608  NA 138 137  K 3.9 4.0  CL 100 101  CO2 28 26  GLUCOSE 137* 143*  BUN 26* 26*  CREATININE 1.78* 1.53*  CALCIUM 8.9 8.9  MG -- --  PHOS -- --   Liver Function Tests:  Basename 07/13/12 0500  AST 23  ALT 14  ALKPHOS 111  BILITOT 0.4  PROT 7.3  ALBUMIN 2.7*   No results found for this basename: LIPASE:2,AMYLASE:2 in the last 72 hours CBC:  Basename 07/15/12 0414 07/14/12 0608  WBC 8.2 7.5  NEUTROABS -- --  HGB 10.0* 10.1*  HCT 31.8* 31.9*  MCV 91.4 91.1  PLT 226 216   Cardiac Enzymes: No results found for this basename: CKTOTAL:3,CKMB:3,CKMBINDEX:3,TROPONINI:3 in the last 72 hours BNP: No components found  with this basename: POCBNP:3 D-Dimer: No results found for this basename: DDIMER:2 in the last 72 hours Hemoglobin A1C: No results found for this basename: HGBA1C in the last 72 hours Fasting Lipid Panel: No results found for this basename: CHOL,HDL,LDLCALC,TRIG,CHOLHDL,LDLDIRECT in the last 72 hours Thyroid Function Tests: No results found for this basename: TSH,T4TOTAL,FREET3,T3FREE,THYROIDAB in the last 72 hours Anemia Panel: No results found for this basename: VITAMINB12,FOLATE,FERRITIN,TIBC,IRON,RETICCTPCT in the last 72 hours  RADIOLOGY: Dg Chest Port 1 View  07/13/2012  *RADIOLOGY REPORT*  Clinical Data: History of line placement.  PORTABLE CHEST - 1 VIEW  Comparison: 07/12/2012.  Findings: Tip of right brachiocephalic venous catheter terminates in the superior vena cava lower portion.  No pneumothorax is evident.  Left lung is free of infiltrates.  There is hazy opacity in the inferior right lung with element of atelectasis and associated right pleural effusion.  There is stable cardiac silhouette enlargement. The patient has undergone previous median sternotomy and coronary artery bypass grafting.  IMPRESSION: Tip of right brachiocephalic venous catheter  terminates in the superior vena cava lower portion.  No pneumothorax is evident. Stable cardiac silhouette enlargement.  Hazy opacity inferiorly on the right with atelectasis and associated right pleural effusion without significant change.   Original Report Authenticated By: Onalee Hua Call     PHYSICAL EXAM General: NAD Neck: JVP 12 cm, no thyromegaly or thyroid nodule.  Lungs: crackles right base CV: Nondisplaced PMI.  Heart regular S1/S2, no S3/S4, 2/6 systolic m LLSB.  1+ ankle edema bilaterally. No carotid bruit.  Abdomen: Soft, nontender, no hepatosplenomegaly, no distention.  Neurologic: Alert and oriented x 3.  Psych: Normal affect. Extremities: No clubbing or cyanosis.   TELEMETRY: Reviewed telemetry pt in ectopic atrial  rhythm, rate around 100  ASSESSMENT AND PLAN: 79 yo with history of CAD and paroxysmal atrial fibrillation now with an ectopic atrial rhythm on occasion with RVR that has been difficult to control.  EF 30% on last echo, diffuse hypokinesis.  1. Atrial tachycardia: Rate a little higher this morning than it had been yesterday.  Plan for AV nodal ablation with BiV pacing on Monday.   - Continue heparin gtt for now (history of atrial fibrillation) - Given low EF will try to transition off diltiazem and increase Toprol XL.  Stop diltiazem and increase Toprol XL to 100 mg bid.  2. CHF: Acute on chronic systolic CHF, EF 16% on echo.  She is still volume overloaded on exam.  - Continue Lasix today but will have to watch creatinine closely.   - Add Bidil 1/2 tab tid.  3. TR: ? How severe.  Dr. Graciela Husbands may consider LV pacing only if there is considerable TR.  Will get echo today.   Marca Ancona 07/15/2012 7:42 AM

## 2012-07-15 NOTE — Progress Notes (Signed)
Hr sustained in 140's since OOB to [facility] @ 15:09 p .moderate DOE while OOB ---subsiding now,yet mild SOB still noted.Pt denies : chest pressure,tightness,dizziness abd discomfort and other discomfort. B/P stable ( last 121/75 )O2 sats sustained > 97% on RA . Jessica from [employer] informed . Last serum Potassium = 3.9 .Potassium chloride 20 meq given po this am ( 60 meq given yesterday).Lasix 40 mg given IV this am ( as yesterday ) .Awaiting orders.

## 2012-07-15 NOTE — Progress Notes (Signed)
ANTICOAGULATION CONSULT NOTE  Pharmacy Consult for Heparin Indication: atrial fibrillation  Allergies  Allergen Reactions  . Betadine (Povidone Iodine) Itching  . Capoten (Captopril) Other (See Comments)    unknown  . Methadone   . Penicillins Swelling  . Propoxyphene And Methadone     Intolerance to darvocet    Patient Measurements: Height: 5\' 3"  (160 cm) Weight: 220 lb 14.4 oz (100.2 kg) IBW/kg (Calculated) : 52.4  Heparin dosing weight: 76 kg   Vital Signs: Temp: 97.9 F (36.6 C) (01/12 0400) Temp src: Oral (01/12 0400) BP: 124/69 mmHg (01/12 0400) Pulse Rate: 48  (01/12 0400)  Labs:  Basename 07/15/12 0414 07/14/12 0608 07/13/12 1843 07/13/12 0500  HGB 10.0* 10.1* -- --  HCT 31.8* 31.9* -- --  PLT 226 216 -- --  APTT -- -- -- --  LABPROT 16.1* 17.4* -- 20.5*  INR 1.32 1.47 -- 1.83*  HEPARINUNFRC 1.18* 0.69 0.32 --  CREATININE 1.78* 1.53* -- 1.65*  CKTOTAL -- -- -- --  CKMB -- -- -- --  TROPONINI -- -- -- --    Estimated Creatinine Clearance: 29.9 ml/min (by C-G formula based on Cr of 1.78).  Assessment: [name] with h/o Afib, Coumadin on hold for ablation tomorrow, for Heparin  Goal of Therapy:  INR 2-2.5 per [facility] note Heparin level 0.3-0.7 Monitor platelets by anticoagulation protocol: Yes   Plan:  Hold heparin for 45 minutes, then decrease heparin 1000 units/hr Check heparin level in 8 hours.  Geannie Risen, PharmD, BCPS 07/15/2012,5:18 AM

## 2012-07-15 NOTE — Progress Notes (Signed)
Patient Name: [name]      SUBJECTIVE: feels better  Past Medical History  Diagnosis Date  . Coronary artery disease     a. Severe two vessel; s/p CABG;  b. 05/2012 NSTEMI in setting of rapid aflutter  . Hypertension   . Diabetes mellitus     Type 2  . Hyperlipidemia   . Diabetic neuropathy   . PAF (paroxysmal atrial fibrillation)     a. Failed medical therapy with amiodarone; not a candidate for class IC meds due to her CAD. No Multaq due to CHF hx; QT prolonged with amiodarone; Not able to be ablated due to valve disease  . CVA (cerebral vascular accident) 2004  . Hypothyroidism   . Chronic anemia   . Diastolic dysfunction     per echo in May of 2012  . GERD (gastroesophageal reflux disease)   . Arthritis   . Memory loss, short term   . COPD (chronic obstructive pulmonary disease)   . Blood transfusion without reported diagnosis   . Chronic combined systolic and diastolic CHF (congestive heart failure)     a. 05/2012 Echo: EF 30%, diff HK, Mild AS, MR, Mod TR, PASP .  . Moderate mitral regurgitation     a. mod by TEE 2012, mild by echo 2013  . Severe tricuspid regurgitation     a. Severe by TEE 2012, mod by echo 2013  . Atrial flutter     a. 05/2012 -> rate controlled, coumadin resumed.    PHYSICAL EXAM Filed Vitals:   07/15/12 1229 07/15/12 1300 07/15/12 1315 07/15/12 1400  BP:  117/59 121/75   Pulse:  147 163 145  Temp: 97.4 F (36.3 C)     TempSrc: Oral     Resp:  18 25 23   Height:      Weight:      SpO2:  99% 98% 96%    Well developed and nourished in no acute distress HENT normal Neck supple with JVP-flat Carotids brisk and full without bruits Clear Irregularly irregular rate and rhythm with rapid ventricular response, no murmurs or gallops Abd-soft with active BS without hepatomegaly No Clubbing cyanosis edema Skin-warm and dry A & Oriented  Grossly normal sensory and motor function  TELEMETRY: Reviewed telemetry pt in  atach    Intake/Output Summary (Last 24 hours) at 07/15/12 1406 Last data filed at 07/15/12 1309  Gross per 24 hour  Intake 1327.6 ml  Output   4175 ml  Net -2847.4 ml    LABS: Basic Metabolic Panel:  Lab 07/15/12 9604 07/14/12 0608 07/13/12 0500 07/12/12 0231 07/11/12 1537  NA 138 137 136 133* 137  K 3.9 4.0 3.8 3.9 3.8  CL 100 101 100 98 101  CO2 28 26 26 27 28   GLUCOSE 137* 143* 255* 312* 123*  BUN 26* 26* 29* 28* 25*  CREATININE 1.78* 1.53* 1.65* 1.59* 1.60*  CALCIUM 8.9 8.9 -- -- --  MG -- -- -- -- --  PHOS -- -- -- -- --   Cardiac Enzymes: No results found for this basename: CKTOTAL:3,CKMB:3,CKMBINDEX:3,TROPONINI:3 in the last 72 hours CBC:  Lab 07/15/12 0414 07/14/12 0608 07/11/12 1537  WBC 8.2 7.5 8.5  NEUTROABS -- -- 5.2  HGB 10.0* 10.1* 10.7*  HCT 31.8* 31.9* 33.1*  MCV 91.4 91.1 90.9  PLT 226 216 208   PROTIME:  Basename 07/15/12 0414 07/14/12 0608 07/13/12 0500  LABPROT 16.1* 17.4* 20.5*  INR 1.32 1.47 1.83*   Liver Function Tests:  Basename 07/13/12 0500  AST 23  ALT 14  ALKPHOS 111  BILITOT 0.4  PROT 7.3  ALBUMIN 2.7*   No results found for this basename: LIPASE:2,AMYLASE:2 in the last 72 hours BNP: BNP (last 3 results)  Basename 07/14/12 0928 07/11/12 1537 05/26/12 0147  PROBNP 2025.0* 2171.0* 2740.0*      ASSESSMENT AND PLAN:  Patient Active Hospital Problem List: Acute on chronic combined systolic and diastolic congestive heart failure, NYHA class 4 (07/12/2012)   Atrial tachycardia (05/30/2011)   Hypothyroidism (11/26/2010)   CAD (coronary artery disease) (11/26/2010)   CKD (chronic kidney disease) stage 3, GFR 30-59 ml/min (10/23/2011)   Cardiomyopathy-cause unknown ? rate related v ischemic (07/12/2012)   HAVE FREVIEWED WITH family the issues, cardiomyopathy, CHF and atrial atachycardia with uncontrolled VR and failure of AADs.  Plan is AV ablation preceded by CRT-P   Risk and benefits reviewed.  The other issue is whether  the RV failure and Tricuspid regurgitation is something we should be concerned about.  I am inclined to say no, based on [facility] observation that the TR seemed to be related to coaptation issues, so not primary pathology that we will worsen, and that security of lead fixation is of the utmost importance if we proceed, as we plan, with av Ablation  We have also discussed renal risks wth the use of contrast which we will minimize  Will d/c diuretics with increasing creatinine   Signed, Sherryl Manges MD  07/15/2012

## 2012-07-15 NOTE — Progress Notes (Signed)
ANTICOAGULATION CONSULT NOTE  Pharmacy Consult for Heparin Indication: atrial fibrillation  Allergies  Allergen Reactions  . Betadine (Povidone Iodine) Itching  . Capoten (Captopril) Other (See Comments)    unknown  . Methadone   . Penicillins Swelling  . Propoxyphene And Methadone     Intolerance to darvocet    Patient Measurements: Height: 5\' 3"  (160 cm) Weight: 220 lb 14.4 oz (100.2 kg) IBW/kg (Calculated) : 52.4  Heparin dosing weight: 76 kg   Vital Signs: Temp: 97.4 F (36.3 C) (01/12 1229) Temp src: Oral (01/12 1229) BP: 121/75 mmHg (01/12 1315) Pulse Rate: 145  (01/12 1400)  Labs:  Basename 07/15/12 1317 07/15/12 0414 07/14/12 0608 07/13/12 0500  HGB -- 10.0* 10.1* --  HCT -- 31.8* 31.9* --  PLT -- 226 216 --  APTT -- -- -- --  LABPROT -- 16.1* 17.4* 20.5*  INR -- 1.32 1.47 1.83*  HEPARINUNFRC 0.72* 1.18* 0.69 --  CREATININE -- 1.78* 1.53* 1.65*  CKTOTAL -- -- -- --  CKMB -- -- -- --  TROPONINI -- -- -- --    Estimated Creatinine Clearance: 29.9 ml/min (by C-G formula based on Cr of 1.78).  Assessment: [name] with h/o Afib, Coumadin on hold for ablation tomorrow, on slightly supratherapeutic Heparin  Goal of Therapy:  INR 2-2.5 per [facility] note Heparin level 0.3-0.7 Monitor platelets by anticoagulation protocol: Yes   Plan:  - continue holding Coumadin for now - Decrease heparin to 900 units/hr - HL in 8 hours (2300)  - Daily PT/INR/HL and CBC - f/u plans after procedure  Franchot Erichsen, Pharm.D. Clinical Pharmacist   Pager: [phone] 07/15/2012 2:49 PM

## 2012-07-15 NOTE — Progress Notes (Signed)
Pt c/o aching epigastric pain and mid- back pain .The patient is also burping loudly every few min. Pt also has a "squeezing " feeling," but I know it's gas." 12-lead EKG done.Dr informed.Order received for maalox( already given) .

## 2012-07-16 ENCOUNTER — Ambulatory Visit (HOSPITAL_COMMUNITY): Admit: 2012-07-16 | Payer: Medicare Other | Admitting: Internal Medicine

## 2012-07-16 LAB — CBC
HCT: 29.4 % — ABNORMAL LOW (ref 36.0–46.0)
Hemoglobin: 7.7 g/dL — ABNORMAL LOW (ref 12.0–15.0)
Hemoglobin: 9.5 g/dL — ABNORMAL LOW (ref 12.0–15.0)
MCH: 29.4 pg (ref 26.0–34.0)
MCV: 91.6 fL (ref 78.0–100.0)
MCV: 91 fL (ref 78.0–100.0)
RBC: 2.62 MIL/uL — ABNORMAL LOW (ref 3.87–5.11)
RBC: 3.23 MIL/uL — ABNORMAL LOW (ref 3.87–5.11)
WBC: 7.4 10*3/uL (ref 4.0–10.5)

## 2012-07-16 LAB — BASIC METABOLIC PANEL
BUN: 31 mg/dL — ABNORMAL HIGH (ref 6–23)
CO2: 28 mEq/L (ref 19–32)
CO2: 26 mEq/L (ref 19–32)
Chloride: 99 mEq/L (ref 96–112)
Creatinine, Ser: 1.87 mg/dL — ABNORMAL HIGH (ref 0.50–1.10)
GFR calc Af Amer: 29 mL/min — ABNORMAL LOW (ref >90)
Glucose, Bld: 257 mg/dL — ABNORMAL HIGH (ref 70–99)
Glucose, Bld: 223 mg/dL — ABNORMAL HIGH (ref 70–99)
Potassium: 4.5 mEq/L (ref 3.5–5.1)
Potassium: 4.8 mEq/L (ref 3.5–5.1)
Sodium: 135 mEq/L (ref 135–145)

## 2012-07-16 LAB — GLUCOSE, CAPILLARY
Glucose-Capillary: 195 mg/dL — ABNORMAL HIGH (ref 70–99)
Glucose-Capillary: 221 mg/dL — ABNORMAL HIGH (ref 70–99)
Glucose-Capillary: 250 mg/dL — ABNORMAL HIGH (ref 70–99)

## 2012-07-16 MED ORDER — LIDOCAINE HCL (PF) 1 % IJ SOLN
INTRAMUSCULAR | Status: AC
  Filled 2012-07-16: qty 60

## 2012-07-16 MED ORDER — WARFARIN SODIUM 5 MG PO TABS
5.0000 mg | ORAL_TABLET | Freq: Once | ORAL | Status: AC
  Administered 2012-07-16: 5 mg via ORAL
  Filled 2012-07-16: qty 1

## 2012-07-16 MED ORDER — HEPARIN (PORCINE) IN NACL 100-0.45 UNIT/ML-% IJ SOLN
950.0000 [IU]/h | INTRAMUSCULAR | Status: DC
  Filled 2012-07-16: qty 250

## 2012-07-16 MED ORDER — CHLORHEXIDINE GLUCONATE 4 % EX LIQD
CUTANEOUS | Status: AC
  Administered 2012-07-16: 4 via TOPICAL
  Filled 2012-07-16: qty 60

## 2012-07-16 MED ORDER — BUPIVACAINE HCL (PF) 0.25 % IJ SOLN
INTRAMUSCULAR | Status: AC
  Filled 2012-07-16: qty 30

## 2012-07-16 MED ORDER — GUAIFENESIN-DM 100-10 MG/5ML PO SYRP
5.0000 mL | ORAL_SOLUTION | ORAL | Status: DC | PRN
  Administered 2012-07-16: 5 mL via ORAL
  Filled 2012-07-16: qty 5

## 2012-07-16 MED ORDER — HEPARIN (PORCINE) IN NACL 100-0.45 UNIT/ML-% IJ SOLN
1000.0000 [IU]/h | INTRAMUSCULAR | Status: AC
  Administered 2012-07-17: 1000 [IU]/h via INTRAVENOUS
  Filled 2012-07-16 (×2): qty 250

## 2012-07-16 MED ORDER — WARFARIN - PHARMACIST DOSING INPATIENT
Freq: Every day | Status: DC
  Administered 2012-07-17: 18:00:00

## 2012-07-16 MED ORDER — HEPARIN (PORCINE) IN NACL 2-0.9 UNIT/ML-% IJ SOLN
INTRAMUSCULAR | Status: AC
  Filled 2012-07-16: qty 500

## 2012-07-16 MED ORDER — SODIUM CHLORIDE 0.9 % IR SOLN
Status: DC
  Filled 2012-07-16: qty 2

## 2012-07-16 MED ORDER — VANCOMYCIN HCL IN DEXTROSE 1-5 GM/200ML-% IV SOLN: 1000.0000 mg | INTRAVENOUS | Status: DC

## 2012-07-16 NOTE — Progress Notes (Signed)
ANTICOAGULATION CONSULT NOTE  Pharmacy Consult for Heparin Indication: atrial fibrillation  Allergies  Allergen Reactions  . Betadine (Povidone Iodine) Itching  . Capoten (Captopril) Other (See Comments)    unknown  . Methadone   . Penicillins Swelling  . Propoxyphene And Methadone     Intolerance to darvocet    Patient Measurements: Height: 5\' 3"  (160 cm) Weight: 220 lb 14.4 oz (100.2 kg) IBW/kg (Calculated) : 52.4  Heparin dosing weight: 76 kg   Vital Signs: Temp: 97.7 F (36.5 C) (01/12 2000) Temp src: Oral (01/12 2000) BP: 130/63 mmHg (01/13 0000) Pulse Rate: 72  (01/13 0000)  Labs:  Basename 07/15/12 2335 07/15/12 1317 07/15/12 0414 07/14/12 0608 07/13/12 0500  HGB -- -- 10.0* 10.1* --  HCT -- -- 31.8* 31.9* --  PLT -- -- 226 216 --  APTT -- -- -- -- --  LABPROT -- -- 16.1* 17.4* 20.5*  INR -- -- 1.32 1.47 1.83*  HEPARINUNFRC 0.31 0.72* 1.18* -- --  CREATININE -- -- 1.78* 1.53* 1.65*  CKTOTAL -- -- -- -- --  CKMB -- -- -- -- --  TROPONINI -- -- -- -- --    Estimated Creatinine Clearance: 29.9 ml/min (by C-G formula based on Cr of 1.78).  Assessment: [name] with h/o Afib, Coumadin on hold for ablation, heparin is at lower-end of goal range on 900 units/hr. No problem with line / infusion and no bleeding per RN.   Goal of Therapy:  INR 2-2.5 per [facility] note Heparin level 0.3-0.7 Monitor platelets by anticoagulation protocol: Yes   Plan:  1. Increase IV heparin to 950 units/hr to keep at-goal. 2. Heparin level in 8 hours.   Lorre Munroe, PharmD, BCPS 07/16/2012 12:12 AM

## 2012-07-16 NOTE — Progress Notes (Addendum)
ANTICOAGULATION CONSULT NOTE - Follow Up Consult  Pharmacy Consult for coumadin Indication: atrial fibrillation  Allergies  Allergen Reactions  . Betadine (Povidone Iodine) Itching  . Capoten (Captopril) Other (See Comments)    unknown  . Methadone   . Penicillins Swelling  . Propoxyphene And Methadone     Intolerance to darvocet    Patient Measurements: Height: 5\' 3"  (160 cm) Weight: 220 lb 14.4 oz (100.2 kg) IBW/kg (Calculated) : 52.4   Vital Signs: Temp: 98.6 F (37 C) (01/13 0400) Temp src: Oral (01/13 0400) BP: 120/63 mmHg (01/13 0600) Pulse Rate: 67  (01/13 0600)  Labs:  Basename 07/16/12 0400 07/15/12 2335 07/15/12 1317 07/15/12 0414 07/14/12 0608  HGB 7.7* -- -- 10.0* --  HCT 24.0* -- -- 31.8* 31.9*  PLT 181 -- -- 226 216  APTT -- -- -- -- --  LABPROT 14.7 -- -- 16.1* 17.4*  INR 1.17 -- -- 1.32 1.47  HEPARINUNFRC -- 0.31 0.72* 1.18* --  CREATININE 1.82* -- -- 1.78* 1.53*  CKTOTAL -- -- -- -- --  CKMB -- -- -- -- --  TROPONINI -- -- -- -- --    Estimated Creatinine Clearance: 29.2 ml/min (by C-G formula based on Cr of 1.82).   Medications:  Scheduled:    . amitriptyline  75 mg Oral QHS  . aspirin EC  81 mg Oral Daily  . atorvastatin  40 mg Oral QPM  . [COMPLETED] bupivacaine      . [COMPLETED] chlorhexidine  60 mL Topical Once  . [COMPLETED] chlorhexidine  60 mL Topical Once  . [COMPLETED] diltiazem  10 mg Intravenous Once  . donepezil  5 mg Oral QHS  . gentamicin irrigation  80 mg Irrigation On Call  . [COMPLETED] heparin      . insulin aspart  0-15 Units Subcutaneous TID WC  . [COMPLETED] insulin glargine  12 Units Subcutaneous Once  . insulin glargine  25 Units Subcutaneous BID  . isosorbide-hydrALAZINE  0.5 tablet Oral TID  . levothyroxine  100 mcg Oral QAC breakfast  . [COMPLETED] lidocaine      . metoprolol succinate  100 mg Oral BID  . pantoprazole  80 mg Oral Q1200  . potassium chloride SA  20 mEq Oral BID  . sodium chloride  10-40  mL Intracatheter Q12H  . sodium chloride  3 mL Intravenous Q12H  . sodium chloride  3 mL Intravenous Q12H  . vancomycin  1,000 mg Intravenous On Call  . [DISCONTINUED] diltiazem  30 mg Oral QID  . [DISCONTINUED] furosemide  40 mg Intravenous BID  . [DISCONTINUED] insulin glargine  25 Units Subcutaneous BID    Assessment: 79 yo female with history of afib on coumadin PTA. Heparin discontinued this am due to Hg decrease 10>> 7.7 (no bleeding noted). INR today= 1.17.  Noted plans for AV nodal ablation with BiV pacing.  Home coumadin dose: 5mg  Tuesday and Thursday; 7.5mg  the rest of the week   Goal of Therapy:  INR= 2-2.5 Monitor platelets by anticoagulation protocol: Yes   Plan:  -Coumadin 5mg  po today -Daily PT/INR  Harland German, Pharm D 07/16/2012 8:20 AM   addendum -heparin to restart (repeat Hg= 9.5) -noted for pacer today -last heparin rate was 950 units/hr  Plan -No heparin bolus (due to procedure later today) -Restart heparin at 950 units/hr -Will follow post procedure  Harland German, Pharm D 07/16/2012 1:20 PM

## 2012-07-16 NOTE — Progress Notes (Signed)
Patient Name: [name]      SUBJECTIVE: feels better denies sob, abd or groin pain; BM last pm was normal  Past Medical History  Diagnosis Date  . Coronary artery disease     a. Severe two vessel; s/p CABG;  b. 05/2012 NSTEMI in setting of rapid aflutter  . Hypertension   . Diabetes mellitus     Type 2  . Hyperlipidemia   . Diabetic neuropathy   . PAF (paroxysmal atrial fibrillation)     a. Failed medical therapy with amiodarone; not a candidate for class IC meds due to her CAD. No Multaq due to CHF hx; QT prolonged with amiodarone; Not able to be ablated due to valve disease  . CVA (cerebral vascular accident) 2004  . Hypothyroidism   . Chronic anemia   . Diastolic dysfunction     per echo in May of 2012  . GERD (gastroesophageal reflux disease)   . Arthritis   . Memory loss, short term   . COPD (chronic obstructive pulmonary disease)   . Blood transfusion without reported diagnosis   . Chronic combined systolic and diastolic CHF (congestive heart failure)     a. 05/2012 Echo: EF 30%, diff HK, Mild AS, MR, Mod TR, PASP .  . Moderate mitral regurgitation     a. mod by TEE 2012, mild by echo 2013  . Severe tricuspid regurgitation     a. Severe by TEE 2012, mod by echo 2013  . Atrial flutter     a. 05/2012 -> rate controlled, coumadin resumed.    PHYSICAL EXAM Filed Vitals:   07/16/12 0300 07/16/12 0400 07/16/12 0500 07/16/12 0600  BP: 125/75 127/71 120/69 120/63  Pulse: 70 69 68 67  Temp:  98.6 F (37 C)    TempSrc:  Oral    Resp: 20 19 18 21   Height:      Weight:      SpO2: 97% 97% 96% 96%    Well developed and nourished in no acute distress HENT normal Neck supple with JVP-flat Carotids brisk and full without bruits Clear regular rate and rhythm with rapid ventricular response, no murmurs or gallops Abd-soft with active BS without hepatomegaly Groin without ecchymosis or tenderness No Clubbing cyanosis edema Skin-warm and dry; no flank  ecchymosis A & Oriented  Grossly normal sensory and motor function  TELEMETRY: Reviewed telemetry pt in atach    Intake/Output Summary (Last 24 hours) at 07/16/12 0759 Last data filed at 07/16/12 0600  Gross per 24 hour  Intake 1525.74 ml  Output   2450 ml  Net -924.26 ml    LABS: Basic Metabolic Panel:  Lab 07/16/12 1610 07/15/12 0414 07/14/12 0608 07/13/12 0500 07/12/12 0231 07/11/12 1537  NA 135 138 137 136 133* 137  K 4.5 3.9 4.0 3.8 3.9 3.8  CL 101 100 101 100 98 101  CO2 28 28 26 26 27 28   GLUCOSE 257* 137* 143* 255* 312* 123*  BUN 28* 26* 26* 29* 28* 25*  CREATININE 1.82* 1.78* 1.53* 1.65* 1.59* 1.60*  CALCIUM 8.5 8.9 -- -- -- --  MG -- -- -- -- -- --  PHOS -- -- -- -- -- --   Cardiac Enzymes: No results found for this basename: CKTOTAL:3,CKMB:3,CKMBINDEX:3,TROPONINI:3 in the last 72 hours CBC:  Lab 07/16/12 0400 07/15/12 0414 07/14/12 0608 07/11/12 1537  WBC 6.4 8.2 7.5 8.5  NEUTROABS -- -- -- 5.2  HGB 7.7* 10.0* 10.1* 10.7*  HCT 24.0* 31.8* 31.9* 33.1*  MCV 91.6 91.4 91.1 90.9  PLT 181 226 216 208   PROTIME:  Basename 07/16/12 0400 07/15/12 0414 07/14/12 0608  LABPROT 14.7 16.1* 17.4*  INR 1.17 1.32 1.47   Liver Function Tests: No results found for this basename: AST:2,ALT:2,ALKPHOS:2,BILITOT:2,PROT:2,ALBUMIN:2 in the last 72 hours No results found for this basename: LIPASE:2,AMYLASE:2 in the last 72 hours BNP: BNP (last 3 results)  Basename 07/14/12 0928 07/11/12 1537 05/26/12 0147  PROBNP 2025.0* 2171.0* 2740.0*      ASSESSMENT AND PLAN:  Patient Active Hospital Problem List: Acute on chronic combined systolic and diastolic congestive heart failure, NYHA class 4 (07/12/2012)   Atrial tachycardia (05/30/2011)   Hypothyroidism (11/26/2010)   CAD (coronary artery disease) (11/26/2010)   CKD (chronic kidney disease) stage 3, GFR 30-59 ml/min (10/23/2011)   Cardiomyopathy-cause unknown ? rate related v ischemic (07/12/2012)  Anemia acute     Heparin discontinued this am HgB down from 10>>7.7 without evidence of bleeding.  Will recheck.  We will need to clarify this prior to proceeding Cr rise has started to slow, almost certainly related to volume depletion and diuretics,  Will recheck also Continue IV dilt for now, while a neg iontrope, the bigger issue is rate, at least in the short term   Signed, Sherryl Manges MD  07/16/2012

## 2012-07-16 NOTE — Progress Notes (Signed)
Subjective: [name] with MMP admitted for AFib c RVR and CHF. Breathing better. Dry cough. Pacer per Dr Graciela Husbands later today. CBGs 195-295  Objective: Vital signs in last 24 hours: Temp:  [97.4 Alexander (36.3 C)-98.6 Alexander (37 C)] 98.6 Alexander (37 C) (01/13 0400) Pulse Rate:  [41-163] 67  (01/13 0600) Resp:  [18-28] 21  (01/13 0600) BP: (110-145)/(41-84) 120/63 mmHg (01/13 0600) SpO2:  [94 %-100 %] 96 % (01/13 0600) Weight change:  Last BM Date: 07/15/12  CBG (last 3)   Basename 07/16/12 0733 07/15/12 2104 07/15/12 1717  GLUCAP 195* 293* 232*    Intake/Output from previous day:  Intake/Output Summary (Last 24 hours) at 07/16/12 0815 Last data filed at 07/16/12 0600  Gross per 24 hour  Intake 1505.74 ml  Output   2450 ml  Net -944.26 ml   01/12 0701 - 01/13 0700 In: 1525.7 [P.O.:600; I.V.:925.7] Out: 2450 [Urine:2450]   Physical Exam  General appearance: In bed in CCU - Made up and comfortable. Resp: Distant but clear Cardio: reg rate controlled. NSR on monitor. GI: soft, non-tender; bowel sounds normal; no masses,  no organomegaly Extremities: min Edema, much better   Lab Results:  Basename 07/16/12 0400 07/15/12 0414  NA 135 138  K 4.5 3.9  CL 101 100  CO2 28 28  GLUCOSE 257* 137*  BUN 28* 26*  CREATININE 1.82* 1.78*  CALCIUM 8.5 8.9  MG -- --  PHOS -- --    No results found for this basename: AST:2,ALT:2,ALKPHOS:2,BILITOT:2,PROT:2,ALBUMIN:2 in the last 72 hours   Basename 07/16/12 0400 07/15/12 0414  WBC 6.4 8.2  NEUTROABS -- --  HGB 7.7* 10.0*  HCT 24.0* 31.8*  MCV 91.6 91.4  PLT 181 226    Lab Results  Component Value Date   INR 1.17 07/16/2012   INR 1.32 07/15/2012   INR 1.47 07/14/2012    No results found for this basename: CKTOTAL:3,CKMB:3,CKMBINDEX:3,TROPONINI:3 in the last 72 hours  No results found for this basename: TSH,T4TOTAL,FREET3,T3FREE,THYROIDAB in the last 72 hours  No results found for this basename:  VITAMINB12:2,FOLATE:2,FERRITIN:2,TIBC:2,IRON:2,RETICCTPCT:2 in the last 72 hours  Micro Results: Recent Results (from the past 240 hour(s))  MRSA PCR SCREENING     Status: Normal   Collection Time   07/11/12  2:32 PM      Component Value Range Status Comment   MRSA by PCR NEGATIVE  NEGATIVE Final      Studies/Results: No results found.   Medications: Scheduled:    . amitriptyline  75 mg Oral QHS  . aspirin EC  Natasha mg Oral Daily  . atorvastatin  40 mg Oral QPM  . donepezil  5 mg Oral QHS  . gentamicin irrigation  80 mg Irrigation On Call  . insulin aspart  0-15 Units Subcutaneous TID WC  . insulin glargine  25 Units Subcutaneous BID  . isosorbide-hydrALAZINE  0.5 tablet Oral TID  . levothyroxine  100 mcg Oral QAC breakfast  . metoprolol succinate  100 mg Oral BID  . pantoprazole  80 mg Oral Q1200  . potassium chloride SA  20 mEq Oral BID  . sodium chloride  10-40 mL Intracatheter Q12H  . sodium chloride  3 mL Intravenous Q12H  . sodium chloride  3 mL Intravenous Q12H  . vancomycin  1,000 mg Intravenous On Call   Continuous:    . sodium chloride 50 mL/hr at 07/16/12 0617  . sodium chloride 250 mL (07/16/12 0600)  . diltiazem (CARDIZEM) infusion 10 mg/hr (07/16/12 0736)  Assessment/Plan: Principal Problem:  *Acute on chronic combined systolic and diastolic congestive heart failure, NYHA class 4 Active Problems:  Hypothyroidism  CAD (coronary artery disease)  Atrial tachycardia  CKD (chronic kidney disease) stage 3, GFR 30-59 ml/min  Diabetes mellitus  Cardiomyopathy-cause unknown ? rate related v ischemic  PAfib - For Ablation and  Pacer today.   CHF tuned up. Edema way down  CAD/CABG  CKD3 - Cr stable to slight bump with her diuresis  HTN - BP is perfect  DM2 complicated by nephropathy and neuropathy with erratic control.  Continue Lantus/ISS-   Basename 07/16/12 0733 07/15/12 2104 07/15/12 1717  GLUCAP 195* 293* 232*   DVT  Prophylaxis/Antticoagulation- Off coumadin for procedure  Dementia/Chronic memory loss - follow, lives with husband.  Her memory issues contributed to her using her insulin in R groin causing R groin Abscess a few months ago - she is better.  Chronic Anemia - Hbg 100 - 10.7and stable.  Today dropped to 7.7 without hematoma or bleeding.  She is HD stable.  Recheck labs as discussed per Dr Graciela Husbands.    LOS: 5 days   Monifa Blanchette M 07/16/2012, 8:15 AM

## 2012-07-16 NOTE — Progress Notes (Signed)
Called and notified dr. Graciela Husbands of bun and creatnine.  And of cvp 31

## 2012-07-16 NOTE — Progress Notes (Signed)
ANTICOAGULATION CONSULT NOTE  Pharmacy Consult for Heparin Indication: atrial fibrillation  Allergies  Allergen Reactions  . Betadine (Povidone Iodine) Itching  . Capoten (Captopril) Other (See Comments)    unknown  . Methadone   . Penicillins Swelling  . Propoxyphene And Methadone     Intolerance to darvocet    Patient Measurements: Height: 5\' 3"  (160 cm) Weight: 220 lb 14.4 oz (100.2 kg) IBW/kg (Calculated) : 52.4  Heparin dosing weight: 76 kg   Vital Signs: Temp: 97.6 F (36.4 C) (01/13 2000) Temp src: Oral (01/13 2000) BP: 135/69 mmHg (01/13 2000) Pulse Rate: 71  (01/13 2000)  Labs:  Basename 07/16/12 2106 07/16/12 0832 07/16/12 0400 07/15/12 2335 07/15/12 1317 07/15/12 0414 07/14/12 0608  HGB -- 9.5* 7.7* -- -- -- --  HCT -- 29.4* 24.0* -- -- 31.8* --  PLT -- 210 181 -- -- 226 --  APTT -- -- -- -- -- -- --  LABPROT -- -- 14.7 -- -- 16.1* 17.4*  INR -- -- 1.17 -- -- 1.32 1.47  HEPARINUNFRC 0.31 -- -- 0.31 0.72* -- --  CREATININE -- 1.87* 1.82* -- -- 1.78* --  CKTOTAL -- -- -- -- -- -- --  CKMB -- -- -- -- -- -- --  TROPONINI -- -- -- -- -- -- --    Estimated Creatinine Clearance: 28.4 ml/min (by C-G formula based on Cr of 1.87).  Assessment: [name] with h/o Afib, Coumadin on hold for ablation. Heparin is still therapeutic.    Goal of Therapy:  INR 2-2.5 per [facility] note Heparin level 0.3-0.7 Monitor platelets by anticoagulation protocol: Yes   Plan:  1. Increase IV heparin to 1000 units/hr 2. F/u with AM level

## 2012-07-17 ENCOUNTER — Inpatient Hospital Stay (HOSPITAL_COMMUNITY): Payer: Medicare Other

## 2012-07-17 DIAGNOSIS — J811 Chronic pulmonary edema: Secondary | ICD-10-CM

## 2012-07-17 DIAGNOSIS — J9600 Acute respiratory failure, unspecified whether with hypoxia or hypercapnia: Secondary | ICD-10-CM

## 2012-07-17 DIAGNOSIS — J90 Pleural effusion, not elsewhere classified: Secondary | ICD-10-CM

## 2012-07-17 LAB — CBC
HCT: 28.9 % — ABNORMAL LOW (ref 36.0–46.0)
Hemoglobin: 9.2 g/dL — ABNORMAL LOW (ref 12.0–15.0)
MCHC: 31.8 g/dL (ref 30.0–36.0)
WBC: 7.4 10*3/uL (ref 4.0–10.5)

## 2012-07-17 LAB — POCT I-STAT 3, ART BLOOD GAS (G3+)
Acid-base deficit: 2 mmol/L (ref 0.0–2.0)
pH, Arterial: 7.363 (ref 7.350–7.450)

## 2012-07-17 LAB — GLUCOSE, CAPILLARY
Glucose-Capillary: 212 mg/dL — ABNORMAL HIGH (ref 70–99)
Glucose-Capillary: 234 mg/dL — ABNORMAL HIGH (ref 70–99)
Glucose-Capillary: 212 mg/dL — ABNORMAL HIGH (ref 70–99)
Glucose-Capillary: 246 mg/dL — ABNORMAL HIGH (ref 70–99)

## 2012-07-17 LAB — BASIC METABOLIC PANEL
Calcium: 9 mg/dL (ref 8.4–10.5)
Creatinine, Ser: 1.88 mg/dL — ABNORMAL HIGH (ref 0.50–1.10)
GFR calc non Af Amer: 25 mL/min — ABNORMAL LOW (ref >90)
Sodium: 134 mEq/L — ABNORMAL LOW (ref 135–145)

## 2012-07-17 LAB — PROTIME-INR
INR: 1.21 (ref 0.00–1.49)
Prothrombin Time: 15.1 seconds (ref 11.6–15.2)

## 2012-07-17 LAB — HEPARIN LEVEL (UNFRACTIONATED): Heparin Unfractionated: 0.45 IU/mL (ref 0.30–0.70)

## 2012-07-17 MED ORDER — SODIUM CHLORIDE 0.9 % IR SOLN: 80.0000 mg | Status: DC

## 2012-07-17 MED ORDER — SODIUM CHLORIDE 0.9 % IJ SOLN: 3.0000 mL | Freq: Two times a day (BID) | INTRAMUSCULAR | Status: DC

## 2012-07-17 MED ORDER — SODIUM CHLORIDE 0.9 % IV SOLN: 250.0000 mL | INTRAVENOUS | Status: DC

## 2012-07-17 MED ORDER — METOLAZONE 5 MG PO TABS
5.0000 mg | ORAL_TABLET | Freq: Every day | ORAL | Status: AC
  Administered 2012-07-17: 5 mg via ORAL
  Filled 2012-07-17: qty 1

## 2012-07-17 MED ORDER — DEXTROSE 5 % IV SOLN
160.0000 mg | Freq: Once | INTRAVENOUS | Status: AC
  Administered 2012-07-17: 160 mg via INTRAVENOUS
  Filled 2012-07-17: qty 16

## 2012-07-17 MED ORDER — WARFARIN SODIUM 7.5 MG PO TABS
7.5000 mg | ORAL_TABLET | Freq: Once | ORAL | Status: AC
  Administered 2012-07-17: 7.5 mg via ORAL
  Filled 2012-07-17 (×2): qty 1

## 2012-07-17 MED ORDER — FUROSEMIDE 10 MG/ML IJ SOLN
80.0000 mg | Freq: Once | INTRAMUSCULAR | Status: AC
  Administered 2012-07-17: 80 mg via INTRAVENOUS
  Filled 2012-07-17: qty 8

## 2012-07-17 MED ORDER — ALBUTEROL SULFATE (5 MG/ML) 0.5% IN NEBU: 2.5000 mg | INHALATION_SOLUTION | RESPIRATORY_TRACT | Status: DC | PRN

## 2012-07-17 MED ORDER — SODIUM CHLORIDE 0.45 % IV SOLN: INTRAVENOUS | Status: DC

## 2012-07-17 MED ORDER — VANCOMYCIN HCL 10 G IV SOLR
1500.0000 mg | INTRAVENOUS | Status: DC
  Filled 2012-07-17: qty 1500

## 2012-07-17 MED ORDER — SODIUM CHLORIDE 0.9 % IJ SOLN: 3.0000 mL | INTRAMUSCULAR | Status: DC | PRN

## 2012-07-17 MED ORDER — SODIUM CHLORIDE 0.9 % IV SOLN
1500.0000 mg | INTRAVENOUS | Status: DC
  Filled 2012-07-17: qty 1500

## 2012-07-17 MED ORDER — ALBUTEROL SULFATE (5 MG/ML) 0.5% IN NEBU
INHALATION_SOLUTION | RESPIRATORY_TRACT | Status: AC
  Administered 2012-07-17: 2.5 mg
  Filled 2012-07-17: qty 0.5

## 2012-07-17 NOTE — Progress Notes (Signed)
ANTICOAGULATION CONSULT NOTE - Follow Up Consult  Pharmacy Consult for coumadin, heparin Indication: atrial fibrillation  Allergies  Allergen Reactions  . Betadine (Povidone Iodine) Itching  . Capoten (Captopril) Other (See Comments)    unknown  . Methadone   . Penicillins Swelling  . Propoxyphene And Methadone     Intolerance to darvocet    Patient Measurements: Height: 5\' 3"  (160 cm) Weight: 228 lb 6.3 oz (103.6 kg) IBW/kg (Calculated) : 52.4   Vital Signs: Temp: 97.6 F (36.4 C) (01/14 0745) Temp src: Oral (01/14 0745) BP: 124/70 mmHg (01/14 0700) Pulse Rate: 64  (01/14 0700)  Labs:  Basename 07/17/12 0429 07/16/12 2106 07/16/12 0832 07/16/12 0400 07/15/12 2335 07/15/12 0414  HGB 9.2* -- 9.5* -- -- --  HCT 28.9* -- 29.4* 24.0* -- --  PLT 202 -- 210 181 -- --  APTT -- -- -- -- -- --  LABPROT 15.1 -- -- 14.7 -- 16.1*  INR 1.21 -- -- 1.17 -- 1.32  HEPARINUNFRC 0.45 0.31 -- -- 0.31 --  CREATININE -- -- 1.87* 1.82* -- 1.78*  CKTOTAL -- -- -- -- -- --  CKMB -- -- -- -- -- --  TROPONINI -- -- -- -- -- --    Estimated Creatinine Clearance: 29 ml/min (by C-G formula based on Cr of 1.87).   Medications:  Scheduled:     . amitriptyline  75 mg Oral QHS  . aspirin EC  81 mg Oral Daily  . atorvastatin  40 mg Oral QPM  . donepezil  5 mg Oral QHS  . furosemide  80 mg Intravenous Once  . [EXPIRED] gentamicin irrigation  80 mg Irrigation On Call  . gentamicin irrigation   Irrigation To Cath  . insulin aspart  0-15 Units Subcutaneous TID WC  . insulin glargine  25 Units Subcutaneous BID  . isosorbide-hydrALAZINE  0.5 tablet Oral TID  . levothyroxine  100 mcg Oral QAC breakfast  . metoprolol succinate  100 mg Oral BID  . pantoprazole  80 mg Oral Q1200  . potassium chloride SA  20 mEq Oral BID  . sodium chloride  10-40 mL Intracatheter Q12H  . sodium chloride  3 mL Intravenous Q12H  . sodium chloride  3 mL Intravenous Q12H  . [EXPIRED] vancomycin  1,000 mg  Intravenous On Call  . vancomycin  1,000 mg Intravenous 60 min Pre-Op  . [COMPLETED] warfarin  5 mg Oral ONCE-1800  . Warfarin - Pharmacist Dosing Inpatient   Does not apply q1800    Assessment: 79 yo female with history of afib on coumadin PTA. Heparin discontinued this am due to Hg 9.2. INR today= 1.21 and heparin level at goal on 1000 units/hr.  Noted plans for AV nodal ablation with BiV pacing on 1/15.  Home coumadin dose: 5mg  Tuesday and Thursday; 7.5mg  the rest of the week   Goal of Therapy:  INR= 2-2.5 Monitor platelets by anticoagulation protocol: Yes   Plan:  -Coumadin 7.5mg  po today -Daily PT/INR  Harland German, Pharm D 07/17/2012 8:16 AM

## 2012-07-17 NOTE — Progress Notes (Signed)
Patient Name: [name]      SUBJECTIVE: no sob but feels aweful  Wants to get out of bed, some cough and wheezing  Past Medical History  Diagnosis Date  . Coronary artery disease     a. Severe two vessel; s/p CABG;  b. 05/2012 NSTEMI in setting of rapid aflutter  . Hypertension   . Diabetes mellitus     Type 2  . Hyperlipidemia   . Diabetic neuropathy   . PAF (paroxysmal atrial fibrillation)     a. Failed medical therapy with amiodarone; not a candidate for class IC meds due to her CAD. No Multaq due to CHF hx; QT prolonged with amiodarone; Not able to be ablated due to valve disease  . CVA (cerebral vascular accident) 2004  . Hypothyroidism   . Chronic anemia   . Diastolic dysfunction     per echo in May of 2012  . GERD (gastroesophageal reflux disease)   . Arthritis   . Memory loss, short term   . COPD (chronic obstructive pulmonary disease)   . Blood transfusion without reported diagnosis   . Chronic combined systolic and diastolic CHF (congestive heart failure)     a. 05/2012 Echo: EF 30%, diff HK, Mild AS, MR, Mod TR, PASP .  . Moderate mitral regurgitation     a. mod by TEE 2012, mild by echo 2013  . Severe tricuspid regurgitation     a. Severe by TEE 2012, mod by echo 2013  . Atrial flutter     a. 05/2012 -> rate controlled, coumadin resumed.    PHYSICAL EXAM Filed Vitals:   07/17/12 0436 07/17/12 0500 07/17/12 0600 07/17/12 0700  BP:  116/63  124/70  Pulse:  65 64 64  Temp:      TempSrc:      Resp:  20 23 20   Height:      Weight: 228 lb 6.3 oz (103.6 kg)     SpO2:  96% 96% 95%    Well developed and nourished in no acute distress HENT normal Neck supple with JVP-flat Clear but marked decrease breath sounds R lung Regular rate and rhythm, no murmurs or gallops Abd-soft with active BS No Clubbing cyanosis edema Skin-warm and dry A & Oriented  Grossly normal sensory and motor function  TELEMETRY: Reviewed telemetry pt in  Atrial  tach   Intake/Output Summary (Last 24 hours) at 07/17/12 0725 Last data filed at 07/17/12 0700  Gross per 24 hour  Intake 2810.26 ml  Output   1650 ml  Net 1160.26 ml    LABS: Basic Metabolic Panel:  Lab 07/16/12 8657 07/16/12 0400 07/15/12 0414 07/14/12 0608 07/13/12 0500 07/12/12 0231 07/11/12 1537  NA 135 135 138 137 136 133* 137  K 4.8 4.5 3.9 4.0 3.8 3.9 3.8  CL 99 101 100 101 100 98 101  CO2 26 28 28 26 26 27 28   GLUCOSE 223* 257* 137* 143* 255* 312* 123*  BUN 31* 28* 26* 26* 29* 28* 25*  CREATININE 1.87* 1.82* 1.78* 1.53* 1.65* 1.59* 1.60*  CALCIUM 9.1 8.5 -- -- -- -- --  MG -- -- -- -- -- -- --  PHOS -- -- -- -- -- -- --   Cardiac Enzymes: No results found for this basename: CKTOTAL:3,CKMB:3,CKMBINDEX:3,TROPONINI:3 in the last 72 hours CBC:  Lab 07/17/12 0429 07/16/12 0832 07/16/12 0400 07/15/12 0414 07/14/12 0608 07/11/12 1537  WBC 7.4 7.4 6.4 8.2 7.5 8.5  NEUTROABS -- -- -- -- -- 5.2  HGB 9.2* 9.5* 7.7* 10.0* 10.1* 10.7*  HCT 28.9* 29.4* 24.0* 31.8* 31.9* 33.1*  MCV 90.9 91.0 91.6 91.4 91.1 90.9  PLT 202 210 181 226 216 208   PROTIME:  Basename 07/17/12 0429 07/16/12 0400 07/15/12 0414  LABPROT 15.1 14.7 16.1*  INR 1.21 1.17 1.32   Liver Function Tests: No results found for this basename: AST:2,ALT:2,ALKPHOS:2,BILITOT:2,PROT:2,ALBUMIN:2 in the last 72 hours No results found for this basename: LIPASE:2,AMYLASE:2 in the last 72 hours BNP: BNP (last 3 results)  Basename 07/14/12 0928 07/11/12 1537 05/26/12 0147  PROBNP 2025.0* 2171.0* 2740.0*    ASSESSMENT AND PLAN:  Patient Active Hospital Problem List: Acute on chronic combined systolic and diastolic congestive heart failure, NYHA class 4 (07/12/2012)  Atrial tachycardia (05/30/2011)  Hypothyroidism (11/26/2010)  CAD (coronary artery disease) (11/26/2010)  CKD (chronic kidney disease) stage 3, GFR 30-59 ml/min (10/23/2011)  Diabetes mellitus ()  Cardiomyopathy-cause unknown ? rate related v  ischemic (07/12/2012)  Decreased breath sounds R lung   Will give one dose diuretic today' Recheck Cr Anticipate PPM in am CXR today and ? pulm consult ? r Effusion     Signed, Sherryl Manges MD  07/17/2012

## 2012-07-17 NOTE — Progress Notes (Addendum)
Inpatient Diabetes Program Recommendations  AACE/ADA: New Consensus Statement on Inpatient Glycemic Control (2013)  Target Ranges:  Prepandial:   less than 140 mg/dL      Peak postprandial:   less than 180 mg/dL (1-2 hours)      Critically ill patients:  140 - 180 mg/dL  Results for RAYGEN, LINQUIST (MRN [id number]) as of 07/17/2012 14:19  Ref. Range 07/16/2012 07:33 07/16/2012 12:04 07/16/2012 17:16 07/16/2012 21:20 07/17/2012 07:22 07/17/2012 12:00  Glucose-Capillary Latest Range: 70-99 mg/dL 952 (H) 841 (H) 324 (H) 250 (H) 212 (H) 234 (H)    Inpatient Diabetes Program Recommendations Insulin - Basal: Please consider increasing Lantus to 30 units BID. Correction (SSI): Please consider increasing Novolog correction to Resistant scale and adding bedtime coverage.  Bedtime blood glucose on 07/16/12 was 250 mg/dl and fasting CBG this mornging was 212 mg/dl.  Note: Patient has a history of diabetes and takes Lantus 25 units BID, Humalog 0-15 units TID, and Amaryl 2mg  BID at home for diabetes management.  Currently ordered Lantus 25 units BID and Novolog moderate correction before meals for inpatient glycemic control.  Last A1C in the chart was 10.6% on 05/26/2012.  Please consider increasing Lantus to 30 units BID, increasing Novolog correction to resistant scale and adding bedtime coverage, and ordering an A1C.  Will continue to follow.  Thanks,  Orlando Penner, RN, BSN, CCRN Diabetes Coordinator Inpatient Diabetes Program [phone]

## 2012-07-17 NOTE — Care Management Note (Signed)
    Page 1 of 2   07/19/2012     4:22:44 PM   CARE MANAGEMENT NOTE 07/19/2012  Patient:  [name], [name]   Account Number:  [id number]  Date Initiated:  07/17/2012  Documentation initiated by:  [facility]  Subjective/Objective Assessment:   Admited with Afib and CHF.  Lives at home with spouse.     Action/Plan:   Anticipated DC Date:  07/20/2012   Anticipated DC Plan:  HOME W HOME HEALTH SERVICES      DC Planning Services  CM consult      Choice offered to / List presented to:          [facility] arranged  HH-1 RN      [facility] agency  [facility]   Status of service:  Completed, signed off Medicare Important Message given?   (If response is "NO", the following Medicare IM given date fields will be blank) Date Medicare IM given:   Date Additional Medicare IM given:    Discharge Disposition:  HOME W HOME HEALTH SERVICES  Per UR Regulation:  Reviewed for med. necessity/level of care/duration of stay  If discussed at Long Length of Stay Meetings, dates discussed:   07/19/2012    Comments:  Contact:  Greek,John Spouse [phone]   [phone]                 Hunter,Marquita Daughter [phone] 1/16 1619 debbie Samiksha Pellicano rn,bsn [facility] and she has spoken w pt and they will follow pt for hhrn after disch. 07-17-12 10:13am Avie Arenas, RNBSN [phone] Plan for pacemaker on 07-18-12.  In past has had HH with [facility] for wound VAC to abd.  Will need HH orders for RN on discharge for f/u CHF program.

## 2012-07-17 NOTE — Progress Notes (Signed)
Patient more short of breath. On 2 liters O2, Sats 97-99%. RR 28-32. BP stable. Having difficulty talking due to SOB. Slightly confused. Rhonda Barrett notified and came to unit to assess pt. Orders received.

## 2012-07-17 NOTE — Progress Notes (Signed)
Patient assisted up to bedside commode to void.  Very SOB on exertion.  Only voided 50 ml clear yellow urine. Patient stated she still "felt like she had to go".   Foley catheter inserted using aseptic technique per protocol for acute urinary retention and pre-procedure (pacemaker insertion in am).  Immediate return of 250 ml clear yellow urine.  Patient tolerated procedure well.

## 2012-07-17 NOTE — Consult Note (Signed)
PULMONARY  / CRITICAL CARE MEDICINE  Name: [name] MRN: [id number] DOB: August 03, 1932    LOS: 6  REFERRING MD :  Dr. [place]  CHIEF COMPLAINT:  Pleural Effusion  BRIEF PATIENT DESCRIPTION: 79 y/o BF with PMH of complicated medical history who presented to Cardiology office on 1/8 with a 2 week hx of palpitations / heart racing, dyspnea at rest & exertion, easily tired, increased weight of 16 lbs, non-productive cough.  Admitted for significant decompensation of CHF.  1/14 pt noted to have increased SOB with CXR with increasing pleural effusion.  Rx'd with nebulized bronchodilator and symptoms improved.  PCCM consulted to evaluate pleural effusion / Dyspnea.   PMH:  CAD -severe 2 vessel s/p CABG, HTN, DM, HLD, DM, CVA 2004, Hypothyroidism, Anemia, Diastolic Dysfunction, PAF / Atrial Flutter, COPD, severe TR / mod  MR on TEE 05/2012   LINES / TUBES: PIV  CULTURES: 1/8 MRSA PCR>>>  ANTIBIOTICS: Gentamicin 1/13 (for cath) >>>1/13 Vanco 1/13 (for cath)>>>1/13  SIGNIFICANT EVENTS:  1/14 Transfer to the ICU for increased work of breathing and need of BiPAP due to pulmonary edema.  LEVEL OF CARE:  ICU PRIMARY SERVICE:  Cardiology CONSULTANTS:  PCCM CODE STATUS:  Full DIET:  PO DVT Px:  coumadin GI Px:  protonix  HISTORY OF PRESENT ILLNESS:  79 y/o BF with PMH of complicated medical history who presented to Cardiology office on 1/8 with a 2 week hx of palpitations / heart racing, dyspnea at rest & exertion, easily tired, increased weight of 16 lbs, non-productive cough.  Admitted for significant decompensation of CHF. Rx'd with IV diuretics.  1/3 was scheduled for ablation but unable to be performed due to dyspnea.  1/14 pt noted to have increased SOB with CXR with increasing pleural effusion.  Rx'd with nebulized bronchodilator and symptoms improved.  PCCM consulted to evaluate pleural effusion / Dyspnea. Pt denies fevers, chills, n/v/d.  Indicates shortness of breath, mild  anxiousness, chest pain all across her chest, worse with inspiration, non-productive cough.    PAST MEDICAL HISTORY :  Past Medical History  Diagnosis Date  . Coronary artery disease     a. Severe two vessel; s/p CABG;  b. 05/2012 NSTEMI in setting of rapid aflutter  . Hypertension   . Diabetes mellitus     Type 2  . Hyperlipidemia   . Diabetic neuropathy   . PAF (paroxysmal atrial fibrillation)     a. Failed medical therapy with amiodarone; not a candidate for class IC meds due to her CAD. No Multaq due to CHF hx; QT prolonged with amiodarone; Not able to be ablated due to valve disease  . CVA (cerebral vascular accident) 2004  . Hypothyroidism   . Chronic anemia   . Diastolic dysfunction     per echo in May of 2012  . GERD (gastroesophageal reflux disease)   . Arthritis   . Memory loss, short term   . COPD (chronic obstructive pulmonary disease)   . Blood transfusion without reported diagnosis   . Chronic combined systolic and diastolic CHF (congestive heart failure)     a. 05/2012 Echo: EF 30%, diff HK, Mild AS, MR, Mod TR, PASP .  . Moderate mitral regurgitation     a. mod by TEE 2012, mild by echo 2013  . Severe tricuspid regurgitation     a. Severe by TEE 2012, mod by echo 2013  . Atrial flutter     a. 05/2012 -> rate controlled, coumadin resumed.  Past Surgical History  Procedure Date  . Coronary artery bypass graft 06/2009    X3, LIMA to LAD, SVG to diagonal, SVG to the posterior descending artery.  . Tonsillectomy   . Knee arthroscopy   . Transthoracic echocardiogram 11/12/2010     Ejection fraction felt to be around 50%.  Wall thickness was increased in a pattern of mild LVH.  Moderately dilated left atrium.  Mildly dilated right atrium. Right ventricle was mildly dilated with mildly reduced systolic function  . Cardiac catheterization 06/09/2009    inferior wall hypokinesia with ejection fraction of 55%.  . Abdominal hysterectomy   . Joint replacement   .  Tee without cardioversion 05/23/2011    Procedure: TRANSESOPHAGEAL ECHOCARDIOGRAM (TEE);  Surgeon: Lewayne Bunting, MD;  Location: [facility] ENDOSCOPY;  Service: Cardiovascular;  Laterality: N/A;  . Incision and drainage abscess 05/07/2012    Procedure: INCISION AND DRAINAGE ABSCESS;  Surgeon: Ardeth Sportsman, MD;  Location: [facility] OR;  Service: General;  Laterality: N/A;  Groin wound   Prior to Admission medications   Medication Sig Start Date End Date Taking? Authorizing Provider  amitriptyline (ELAVIL) 25 MG tablet Take 75 mg by mouth at bedtime.     Yes Historical Provider, MD  aspirin EC 81 MG tablet Take 81 mg by mouth daily.   Yes Historical Provider, MD  atorvastatin (LIPITOR) 40 MG tablet Take 40 mg by mouth every evening.    Yes Historical Provider, MD  diltiazem (CARDIZEM CD) 240 MG 24 hr capsule Take 1 capsule (240 mg total) by mouth daily. 05/30/12  Yes Ok Anis, NP  donepezil (ARICEPT) 5 MG tablet Take 5 mg by mouth at bedtime.   Yes Historical Provider, MD  esomeprazole (NEXIUM) 40 MG capsule Take 40 mg by mouth daily before breakfast.     Yes Historical Provider, MD  furosemide (LASIX) 40 MG tablet Take 1 tablet (40 mg total) by mouth 2 (two) times daily. 05/30/12  Yes Ok Anis, NP  glimepiride (AMARYL) 4 MG tablet Take 0.5 tablets (2 mg total) by mouth 2 (two) times daily. 05/10/12  Yes [name], MD  insulin glargine (LANTUS) 100 UNIT/ML injection Inject 20 Units into the skin 2 (two) times daily.   Yes Historical Provider, MD  insulin lispro (HUMALOG) 100 UNIT/ML injection Inject 0-15 Units into the skin 3 (three) times daily as needed. Home sliding scale   Yes Historical Provider, MD  levothyroxine (SYNTHROID, LEVOTHROID) 100 MCG tablet Take 100 mcg by mouth daily.   Yes Historical Provider, MD  metoprolol succinate (TOPROL-XL) 50 MG 24 hr tablet Take 50 mg by mouth daily. Take with or immediately following a meal. 05/30/12  Yes Ok Anis, NP    nitroGLYCERIN (NITROSTAT) 0.4 MG SL tablet Place 0.4 mg under the tongue every 5 (five) minutes as needed. For chest pain   Yes Historical Provider, MD  potassium chloride SA (K-DUR,KLOR-CON) 20 MEQ tablet Take 1 tablet (20 mEq total) by mouth daily. 05/30/12  Yes Ok Anis, NP  warfarin (COUMADIN) 5 MG tablet Take 5-7.5 mg by mouth daily. 5mg  Tuesday and Thursday; 7.5mg  the rest of the week   Yes Historical Provider, MD   Allergies  Allergen Reactions  . Betadine (Povidone Iodine) Itching  . Capoten (Captopril) Other (See Comments)    unknown  . Methadone   . Penicillins Swelling  . Propoxyphene And Methadone     Intolerance to darvocet   FAMILY HISTORY:  Family History  Problem  Relation Age of Onset  . Colon cancer Mother   . Stroke Mother   . Hypertension Mother   . Cancer Mother     breast  . Cancer Father     colon  . Heart attack Father     x2   SOCIAL HISTORY:  reports that she has quit smoking. She quit smokeless tobacco use about 40 years ago. She reports that she does not drink alcohol or use illicit drugs.  REVIEW OF SYSTEMS:   All systems reviewed and are negative except for findings in HPI.    INTERVAL HISTORY: Sitting up in bed, mild increase in respiratory effort  VITAL SIGNS: Temp:  [97.5 F (36.4 C)-98.2 F (36.8 C)] 97.6 F (36.4 C) (01/14 0745) Pulse Rate:  [43-139] 43  (01/14 1400) Resp:  [17-25] 23  (01/14 1400) BP: (97-153)/(57-76) 97/59 mmHg (01/14 1400) SpO2:  [95 %-100 %] 99 % (01/14 1400) Weight:  [228 lb 6.3 oz (103.6 kg)] 228 lb 6.3 oz (103.6 kg) (01/14 0436)  HEMODYNAMICS: CVP:  [21 mmHg-23 mmHg] 22 mmHg  VENTILATOR SETTINGS:  BiPAP  INTAKE / OUTPUT: Intake/Output      01/13 0701 - 01/14 0700 01/14 0701 - 01/15 0700   P.O. 1040 480   I.V. (mL/kg) 1770.3 (17.1) 210 (2)   Total Intake(mL/kg) 2810.3 (27.1) 690 (6.7)   Urine (mL/kg/hr) 1650 (0.7) 100 (0.1)   Total Output 1650 100   Net +1160.3 +590         PHYSICAL  EXAMINATION: General:  Elderly female with increased work of breathing Neuro:  Awake, alert, oriented, MAE HEENT:  Mm/pink moist Cardiovascular:  s1s2 rrr, no m/r/g Lungs:  resp's even, diffuse crackles R>L. Abdomen:  Obese/soft, bsx4 active Musculoskeletal:  No acute deformities Skin:  Warm/dry, no clubbing or cyanosis  LABS: Cbc  Lab 07/17/12 0429 07/16/12 0832 07/16/12 0400  WBC 7.4 -- --  HGB 9.2* 9.5* 7.7*  HCT 28.9* 29.4* 24.0*  PLT 202 210 181   Chemistry  Lab 07/17/12 1005 07/16/12 0832 07/16/12 0400  NA 134* 135 135  K 5.0 4.8 4.5  CL 100 99 101  CO2 24 26 28   BUN 33* 31* 28*  CREATININE 1.88* 1.87* 1.82*  CALCIUM 9.0 9.1 8.5  MG -- -- --  PHOS -- -- --  GLUCOSE 244* 223* 257*   Liver fxn  Lab 07/13/12 0500  AST 23  ALT 14  ALKPHOS 111  BILITOT 0.4  PROT 7.3  ALBUMIN 2.7*   coags  Lab 07/17/12 0429 07/16/12 0400 07/15/12 0414  APTT -- -- --  INR 1.21 1.17 1.32   Cardiac markers  Lab 07/12/12 0230 07/11/12 2057 07/11/12 1537  CKTOTAL -- -- --  CKMB -- -- --  TROPONINI <0.30 <0.30 <0.30   BNP  Lab 07/14/12 0928 07/11/12 1537  PROBNP 2025.0* 2171.0*   ABG  Lab 07/17/12 1330  PHART 7.363  PCO2ART 41.2  PO2ART 107.0*  HCO3 23.4  TCO2 25   CBG trend  Lab 07/17/12 1200 07/17/12 0722 07/16/12 2120 07/16/12 1716 07/16/12 1204  GLUCAP 234* 212* 250* 221* 168*    IMAGING: 1/14 CXR>>> same as below 1/14 R Decubitus>>>diffuse airspace disease, small- mod R effusion  ECG: Atrial Tach  DIAGNOSES: Principal Problem:  *Acute on chronic combined systolic and diastolic congestive heart failure, NYHA class 4 Active Problems:  Hypothyroidism  CAD (coronary artery disease)  Atrial tachycardia  CKD (chronic kidney disease) stage 3, GFR 30-59 ml/min  Diabetes mellitus  Cardiomyopathy-cause  unknown ? rate related v ischemic  ASSESSMENT / PLAN:  PULMONARY  ASSESSMENT: Dyspnea - in setting of decompensated acute on chronic CHF -NYHA  Class 4, atrial tachycardia, CKD & morbid obesity R Pleural Effusion  ? COPD - former smoker but quit >40 yrs ago  PLAN:   - PRN Nebulized Bronchodilators but caution with rate (albuterol) - Follow cxr - Assessed with bedside US not enough effusion inorder to tap, not enough to make a difference in this situation to warrant the risk. - BiPAP on a PRN bases for work of breathing.  CARDIOVASCULAR  ASSESSMENT:  Atrial Tachycardia / Fib - on anti-coagulation Acute on chronic combined systolic and diastolic congestive heart failure CAD Cardiomyopathy  Severe TR Mod MR  PLAN:  - Diuresis per cardiology but monitor renal function. - Trend BNP - Pending ablation 1/15 per EP Cardiology if respiratory status is improved. - Anticoagulation per pharmacy  RENAL  ASSESSMENT:   CKD III with hyperkalemia (borderline).  PLAN:   - Trend BMP with active aggressive diuresis - Add zaroxolyn to regiment x2. - BMET in AM for hyperkalemia, hopefully lasix will address.  GASTROINTESTINAL  ASSESSMENT:   Morbid Obesity  PLAN:   - Consider Nutrition consult.  HEMATOLOGIC  ASSESSMENT:   Anemia - normocytic.  Likely of chronic disease.  No active bleeding Chronic Anticoagulation - in setting of Aflutter /Atach  PLAN:  - No indication for transfusion at this time. - Coumadin per pharmacy.  INFECTIOUS  ASSESSMENT:   No indication of acute infectious process.   PLAN:   - Monitor fever curve/leukocytosis.  ENDOCRINE  ASSESSMENT:   DM Hypothyroidism   PLAN:   - SSI. - Synthroid.  NEUROLOGIC  ASSESSMENT:   Hx of Dementia - on aricept  PLAN:   - High risk for delirium, monitor mental status closely  Spoke with family extensively, they have discussed that they do not wish for prolonged support but short term intubation is an acceptable option.  Therefore, at this point will maintain full code.  Will aggressively diurese with F/U labs in AM.  If patient's deteriorates from a  pulmonary standpoint will intubate.  Would recommend decrease of the anti-HTN to allow for slightly higher BP to allow for better perfusion.  Will avoid placement of a central line and use pressors given overall clinical condition and cardiac situation unless have to.  Canary Brim, NP  I have personally obtained a history, examined the patient, evaluated laboratory and imaging results, formulated the assessment and plan and placed orders.  CRITICAL CARE: The patient is critically ill with multiple organ systems failure and requires high complexity decision making for assessment and support, frequent evaluation and titration of therapies, application of advanced monitoring technologies and extensive interpretation of multiple databases. Critical Care Time devoted to patient care services described in this note is 45 minutes.   Alyson Reedy, M.D. [facility] Pulmonary/Critical Care Medicine. Pager: [phone]. After hours pager: [phone].

## 2012-07-17 NOTE — Progress Notes (Signed)
Called by RN re: hypoxia  Pt was more SOB this am when seen by Dr Graciela Husbands. CXR showed increasing pleural effusion but decubitus film showed only small effusion. She was given Lasix 80 mg IV x 1 this am but only 100 cc UOP since then. This pm, she became more SOB and resp more labored. She is wheezing which she reports starting approx 1 week prior to admission. She has not smoked in > 40 years and never had other environmental exposures.  HR 50s-60s, RR 28 with greatly decreased breath sounds on the right and exp wheeze. ABG ordered. Pt c/o chest pain at the lower rib edge all the way across her chest, worse with deep inspiration.  ECG shows junctional rhythm.   Dr Elease Hashimoto in to see the patient and updated the family on her condition.   Plan: Nebulizer with albuterol to try to control wheezing. D/C Cardizem and allow HR to come up some. Lasix 160 mg IV x 1 dose. CCM consulted and will see patient.   ABG    Component Value Date/Time   PHART 7.363 07/17/2012 1330   PCO2ART 41.2 07/17/2012 1330   PO2ART 107.0* 07/17/2012 1330   HCO3 23.4 07/17/2012 1330   TCO2 25 07/17/2012 1330   ACIDBASEDEF 2.0 07/17/2012 1330   O2SAT 98.0 07/17/2012 1330    Pre-CABG lab Component Value Date/Time   PHART 7.325* 06/11/2009 1825   PCO2ART 39.3 06/11/2009 1825   PO2ART 119.0* 06/11/2009 1825   HCO3 20.4 06/11/2009 1825   TCO2 28 06/12/2009 1710   ACIDBASEDEF 5.0* 06/11/2009 1825   O2SAT 98.0 06/11/2009 1825   After nebulizer, pt wheezing and SOB improved but not resolved. Dr Molli Knock in to see pt. Continue to follow closely.   Theodore Demark, PA   I have discussed the case and examined the patient.    I have ordered a ABG, Neb.   Will have PCCM evaluate her.  Vesta Mixer, [name], MD, [facility] 07/17/2012, 4:18 PM Office - [phone] Pager [phone]

## 2012-07-18 ENCOUNTER — Encounter (HOSPITAL_COMMUNITY)
Admission: AD | Disposition: A | Discharge status: Home / Self Care | Payer: Self-pay | Source: Ambulatory Visit | Attending: Cardiology

## 2012-07-18 ENCOUNTER — Inpatient Hospital Stay (HOSPITAL_COMMUNITY): Payer: Medicare Other

## 2012-07-18 DIAGNOSIS — I4891 Unspecified atrial fibrillation: Secondary | ICD-10-CM

## 2012-07-18 DIAGNOSIS — I509 Heart failure, unspecified: Secondary | ICD-10-CM

## 2012-07-18 HISTORY — PX: BI-VENTRICULAR PACEMAKER INSERTION: SHX5462

## 2012-07-18 LAB — BASIC METABOLIC PANEL
BUN: 34 mg/dL — ABNORMAL HIGH (ref 6–23)
Chloride: 100 mEq/L (ref 96–112)
Creatinine, Ser: 2.06 mg/dL — ABNORMAL HIGH (ref 0.50–1.10)
GFR calc Af Amer: 26 mL/min — ABNORMAL LOW (ref >90)
Glucose, Bld: 126 mg/dL — ABNORMAL HIGH (ref 70–99)
Potassium: 3.9 mEq/L (ref 3.5–5.1)

## 2012-07-18 LAB — GLUCOSE, CAPILLARY: Glucose-Capillary: 98 mg/dL (ref 70–99)

## 2012-07-18 LAB — CBC
HCT: 28.4 % — ABNORMAL LOW (ref 36.0–46.0)
MCH: 29.5 pg (ref 26.0–34.0)
MCV: 91 fL (ref 78.0–100.0)
Platelets: 208 10*3/uL (ref 150–400)
RBC: 3.12 MIL/uL — ABNORMAL LOW (ref 3.87–5.11)
RDW: 15.8 % — ABNORMAL HIGH (ref 11.5–15.5)

## 2012-07-18 SURGERY — BI-VENTRICULAR PACEMAKER INSERTION (CRT-P)
Anesthesia: LOCAL

## 2012-07-18 SURGERY — BI-VENTRICULAR PACEMAKER INSERTION (CRT-P)
Anesthesia: LOCAL | Laterality: Bilateral

## 2012-07-18 MED ORDER — SODIUM CHLORIDE 0.9 % IJ SOLN: 3.0000 mL | INTRAMUSCULAR | Status: DC | PRN

## 2012-07-18 MED ORDER — ONDANSETRON HCL 4 MG/2ML IJ SOLN: 4.0000 mg | Freq: Four times a day (QID) | INTRAMUSCULAR | Status: DC | PRN

## 2012-07-18 MED ORDER — SODIUM CHLORIDE 0.9 % IV SOLN
INTRAVENOUS | Status: DC
  Administered 2012-07-18: 90 mL/h via INTRAVENOUS
  Administered 2012-07-19: 02:00:00 via INTRAVENOUS

## 2012-07-18 MED ORDER — SODIUM CHLORIDE 0.9 % IJ SOLN
3.0000 mL | Freq: Two times a day (BID) | INTRAMUSCULAR | Status: DC
  Administered 2012-07-18: 3 mL via INTRAVENOUS

## 2012-07-18 MED ORDER — CHLORHEXIDINE GLUCONATE 4 % EX LIQD
CUTANEOUS | Status: AC
  Administered 2012-07-18: 04:00:00
  Filled 2012-07-18: qty 30

## 2012-07-18 MED ORDER — FENTANYL CITRATE 0.05 MG/ML IJ SOLN
INTRAMUSCULAR | Status: AC
  Filled 2012-07-18: qty 2

## 2012-07-18 MED ORDER — SODIUM CHLORIDE 0.9 % IR SOLN
80.0000 mg | Status: AC
  Administered 2012-07-18: 80 mg
  Filled 2012-07-18: qty 2

## 2012-07-18 MED ORDER — WARFARIN - PHARMACIST DOSING INPATIENT
Freq: Every day | Status: DC
  Administered 2012-07-22 – 2012-07-24 (×3)

## 2012-07-18 MED ORDER — INSULIN GLARGINE 100 UNIT/ML ~~LOC~~ SOLN
30.0000 [IU] | Freq: Two times a day (BID) | SUBCUTANEOUS | Status: DC
  Administered 2012-07-18 (×2): 15 [IU] via SUBCUTANEOUS
  Administered 2012-07-19 – 2012-07-20 (×3): 30 [IU] via SUBCUTANEOUS

## 2012-07-18 MED ORDER — VANCOMYCIN HCL IN DEXTROSE 1-5 GM/200ML-% IV SOLN
1000.0000 mg | INTRAVENOUS | Status: AC
  Administered 2012-07-18: 1000 mg via INTRAVENOUS
  Filled 2012-07-18: qty 200

## 2012-07-18 MED ORDER — CHLORHEXIDINE GLUCONATE 4 % EX LIQD
60.0000 mL | Freq: Once | CUTANEOUS | Status: AC
  Administered 2012-07-18: 4 via TOPICAL

## 2012-07-18 MED ORDER — ACETAMINOPHEN 325 MG PO TABS: 325.0000 mg | ORAL_TABLET | ORAL | Status: DC | PRN

## 2012-07-18 MED ORDER — LIDOCAINE HCL (PF) 1 % IJ SOLN
INTRAMUSCULAR | Status: AC
  Filled 2012-07-18: qty 60

## 2012-07-18 MED ORDER — VANCOMYCIN HCL 10 G IV SOLR
1500.0000 mg | Freq: Two times a day (BID) | INTRAVENOUS | Status: AC
  Administered 2012-07-19: 1500 mg via INTRAVENOUS
  Filled 2012-07-18: qty 1500

## 2012-07-18 MED ORDER — INSULIN LISPRO 100 UNIT/ML ~~LOC~~ SOLN
0.0000 [IU] | Freq: Three times a day (TID) | SUBCUTANEOUS | Status: DC | PRN
  Filled 2012-07-18: qty 3

## 2012-07-18 MED ORDER — SODIUM CHLORIDE 0.9 % IV SOLN: 250.0000 mL | INTRAVENOUS | Status: DC

## 2012-07-18 MED ORDER — MIDAZOLAM HCL 5 MG/5ML IJ SOLN
INTRAMUSCULAR | Status: AC
  Filled 2012-07-18: qty 5

## 2012-07-18 MED ORDER — WARFARIN SODIUM 7.5 MG PO TABS
7.5000 mg | ORAL_TABLET | Freq: Once | ORAL | Status: DC
  Filled 2012-07-18: qty 1

## 2012-07-18 MED ORDER — WARFARIN SODIUM 5 MG PO TABS: 5.0000 mg | ORAL_TABLET | Freq: Every day | ORAL | Status: DC

## 2012-07-18 MED ORDER — METOPROLOL SUCCINATE ER 25 MG PO TB24
25.0000 mg | ORAL_TABLET | Freq: Two times a day (BID) | ORAL | Status: DC
  Administered 2012-07-18: 25 mg via ORAL
  Filled 2012-07-18 (×5): qty 1

## 2012-07-18 MED ORDER — HYDROCODONE-ACETAMINOPHEN 5-325 MG PO TABS
1.0000 | ORAL_TABLET | Freq: Four times a day (QID) | ORAL | Status: DC | PRN
Start: 2012-07-18 — End: 2012-07-26
  Administered 2012-07-18 – 2012-07-26 (×6): 1 via ORAL
  Filled 2012-07-18 (×6): qty 1

## 2012-07-18 MED ORDER — INSULIN GLARGINE 100 UNIT/ML ~~LOC~~ SOLN: 20.0000 [IU] | Freq: Two times a day (BID) | SUBCUTANEOUS | Status: DC

## 2012-07-18 NOTE — Progress Notes (Signed)
ANTICOAGULATION CONSULT NOTE - Follow Up Consult  Pharmacy Consult for coumadin Indication: atrial fibrillation  Allergies  Allergen Reactions  . Betadine (Povidone Iodine) Itching  . Capoten (Captopril) Other (See Comments)    unknown  . Methadone   . Penicillins Swelling  . Propoxyphene And Methadone     Intolerance to darvocet    Patient Measurements: Height: 5\' 3"  (160 cm) Weight: 223 lb 1.7 oz (101.2 kg) IBW/kg (Calculated) : 52.4   Vital Signs: Temp: 98 F (36.7 C) (01/15 0400) Temp src: Oral (01/15 0400) BP: 107/46 mmHg (01/15 0700) Pulse Rate: 65  (01/15 0700)  Labs:  Basename 07/18/12 0355 07/17/12 1005 07/17/12 0429 07/16/12 2106 07/16/12 0832 07/16/12 0400  HGB 9.2* -- 9.2* -- -- --  HCT 28.4* -- 28.9* -- 29.4* --  PLT 208 -- 202 -- 210 --  APTT -- -- -- -- -- --  LABPROT 15.6* -- 15.1 -- -- 14.7  INR 1.27 -- 1.21 -- -- 1.17  HEPARINUNFRC 0.20* -- 0.45 0.31 -- --  CREATININE 2.06* 1.88* -- -- 1.87* --  CKTOTAL -- -- -- -- -- --  CKMB -- -- -- -- -- --  TROPONINI -- -- -- -- -- --    Estimated Creatinine Clearance: 26 ml/min (by C-G formula based on Cr of 2.06).   Medications:  Scheduled:     . [COMPLETED] albuterol      . amitriptyline  75 mg Oral QHS  . aspirin EC  81 mg Oral Daily  . atorvastatin  40 mg Oral QPM  . [COMPLETED] chlorhexidine      . donepezil  5 mg Oral QHS  . [COMPLETED] furosemide  160 mg Intravenous Once  . [COMPLETED] furosemide  80 mg Intravenous Once  . insulin aspart  0-15 Units Subcutaneous TID WC  . insulin glargine  25 Units Subcutaneous BID  . levothyroxine  100 mcg Oral QAC breakfast  . [COMPLETED] metolazone  5 mg Oral Daily  . metoprolol succinate  100 mg Oral BID  . pantoprazole  80 mg Oral Q1200  . potassium chloride SA  20 mEq Oral BID  . sodium chloride  10-40 mL Intracatheter Q12H  . sodium chloride  3 mL Intravenous Q12H  . vancomycin  1,500 mg Intravenous On Call  . [COMPLETED] warfarin  7.5 mg Oral  ONCE-1800  . Warfarin - Pharmacist Dosing Inpatient   Does not apply q1800  . [DISCONTINUED] gentamicin irrigation   Irrigation To Cath  . [DISCONTINUED] gentamicin irrigation  80 mg Irrigation On Call  . [DISCONTINUED] isosorbide-hydrALAZINE  0.5 tablet Oral TID  . [DISCONTINUED] sodium chloride  3 mL Intravenous Q12H  . [DISCONTINUED] sodium chloride  3 mL Intravenous Q12H  . [DISCONTINUED] vancomycin  1,500 mg Intravenous On Call  . [DISCONTINUED] vancomycin  1,000 mg Intravenous 60 min Pre-Op    Assessment: 79 yo female with history of afib on coumadin PTA. INR= 1.27 and heparin d/c last PM (pacer placement today).  Home coumadin dose: 5mg  Tuesday and Thursday; 7.5mg  the rest of the week   Goal of Therapy:  INR= 2-2.5 Monitor platelets by anticoagulation protocol: Yes   Plan:  -Coumadin 7.5mg  po today -Daily PT/INR -Will follow plans post procedure  Harland German, Pharm D 07/18/2012 7:59 AM

## 2012-07-18 NOTE — H&P (View-Only) (Signed)
Patient Name: [name]      SUBJECTIVE: sleepy  Past Medical History  Diagnosis Date  . Coronary artery disease     a. Severe two vessel; s/p CABG;  b. 05/2012 NSTEMI in setting of rapid aflutter  . Hypertension   . Diabetes mellitus     Type 2  . Hyperlipidemia   . Diabetic neuropathy   . PAF (paroxysmal atrial fibrillation)     a. Failed medical therapy with amiodarone; not a candidate for class IC meds due to her CAD. No Multaq due to CHF hx; QT prolonged with amiodarone; Not able to be ablated due to valve disease  . CVA (cerebral vascular accident) 2004  . Hypothyroidism   . Chronic anemia   . Diastolic dysfunction     per echo in May of 2012  . GERD (gastroesophageal reflux disease)   . Arthritis   . Memory loss, short term   . COPD (chronic obstructive pulmonary disease)   . Blood transfusion without reported diagnosis   . Chronic combined systolic and diastolic CHF (congestive heart failure)     a. 05/2012 Echo: EF 30%, diff HK, Mild AS, MR, Mod TR, PASP 34mmHg.  . Moderate mitral regurgitation     a. mod by TEE 2012, mild by echo 2013  . Severe tricuspid regurgitation     a. Severe by TEE 2012, mod by echo 2013  . Atrial flutter     a. 05/2012 -> rate controlled, coumadin resumed.    PHYSICAL EXAM Filed Vitals:   07/18/12 0458 07/18/12 0500 07/18/12 0600 07/18/12 0700  BP:  108/49 105/46 107/46  Pulse:  65 65 65  Temp:      TempSrc:      Resp:  15 14 14  Height:      Weight: 223 lb 1.7 oz (101.2 kg)     SpO2:  100% 100% 99%    Sleepy Regular rate  lkungs clear anteriorly, but too sleepy to listen laterally  No edema  SCDs in place TELEMETRY: Reviewed telemetry pt in atrial tach    Intake/Output Summary (Last 24 hours) at 07/18/12 0828 Last data filed at 07/18/12 0700  Gross per 24 hour  Intake   1010 ml  Output   2935 ml  Net  -1925 ml    LABS: Basic Metabolic Panel:  Lab 07/18/12 0355 07/17/12 1005 07/16/12 0832 07/16/12 0400  07/15/12 0414 07/14/12 0608 07/13/12 0500  NA 136 134* 135 135 138 137 136  K 3.9 5.0 4.8 4.5 3.9 4.0 3.8  CL 100 100 99 101 100 101 100  CO2 27 24 26 28 28 26 26  GLUCOSE 126* 244* 223* 257* 137* 143* 255*  BUN 34* 33* 31* 28* 26* 26* 29*  CREATININE 2.06* 1.88* 1.87* 1.82* 1.78* 1.53* 1.65*  CALCIUM 9.3 9.0 -- -- -- -- --  MG 2.0 -- -- -- -- -- --  PHOS 3.9 -- -- -- -- -- --   Cardiac Enzymes: No results found for this basename: CKTOTAL:3,CKMB:3,CKMBINDEX:3,TROPONINI:3 in the last 72 hours CBC:  Lab 07/18/12 0355 07/17/12 0429 07/16/12 0832 07/16/12 0400 07/15/12 0414 07/14/12 0608 07/11/12 1537  WBC 8.7 7.4 7.4 6.4 8.2 7.5 8.5  NEUTROABS -- -- -- -- -- -- 5.2  HGB 9.2* 9.2* 9.5* 7.7* 10.0* 10.1* 10.7*  HCT 28.4* 28.9* 29.4* 24.0* 31.8* 31.9* 33.1*  MCV 91.0 90.9 91.0 91.6 91.4 91.1 90.9  PLT 208 202 210 181 226 216 208   PROTIME:    Basename 07/18/12 0355 07/17/12 0429 07/16/12 0400  LABPROT 15.6* 15.1 14.7  INR 1.27 1.21 1.17   Liver Function Tests: No results found for this basename: AST:2,ALT:2,ALKPHOS:2,BILITOT:2,PROT:2,ALBUMIN:2 in the last 72 hours No results found for this basename: LIPASE:2,AMYLASE:2 in the last 72 hours BNP: BNP (last 3 results)  Basename 07/14/12 0928 07/11/12 1537 05/26/12 0147  PROBNP 2025.0* 2171.0* 2740.0*   D-Dimer:   ASSESSMENT AND PLAN:  Patient Active Hospital Problem List: Acute on chronic combined systolic and diastolic congestive heart failure, NYHA class 4 (07/12/2012)   Atrial tachycardia (05/30/2011) Chronic kidney disease) stage 3, acute on chronic   Cardiomyopathy-cause unknown ? rate related v ischemic (07/12/2012)   Acute respiratory failure (07/17/2012)  Pulmonary saw the patient yesterday because of pleural effusions and dyspnea. She was given vigorous diuresis with a loss of 2+ liters;  her creatinine this morning has moved over 2  I spoke with her husband extensively (greater than 30 minutes) along with Dr. Russo  regarding the potential implications of worsening renal function and trying to balance it with worsening pulmonary function. We have discussed the issues of contrast nephropathy and a likely consequence of increasing creatinine from her diuretics, the value of fluids and try to reduce the risk of contrast nephropathy and issues of her pulmonary status.  We outlined 3 options. 1-proceed at this moment with associated risks, 2-hydrate preprocedure and proceeded later today 3-cancel to try to further optimize hemodynamics. We have elected to pursue option #2. Mr. Bechard is aware of potential risks use of contrast nephropathy. We will anticipate using minimal contrast; in the event that 2 leads are placed would anticipate proceeding with AV junction ablation. It's just a single lead is placed AV junction ablation may be postponed per 24 hours.   Will use SCDs         Signed, Patriciaann Rabanal MD  07/18/2012   

## 2012-07-18 NOTE — Consult Note (Addendum)
PULMONARY  / CRITICAL CARE MEDICINE  Name: [name] MRN: [id number] DOB: 09/07/1932    LOS: 7  REFERRING MD :  Dr. [place]  CHIEF COMPLAINT:  Pleural Effusion  BRIEF PATIENT DESCRIPTION: 79 y/o BF with PMH of complicated medical history who presented to Cardiology office on 1/8 with a 2 week hx of palpitations / heart racing, dyspnea at rest & exertion, easily tired, increased weight of 16 lbs, non-productive cough.  Admitted for significant decompensation of CHF.  1/14 pt noted to have increased SOB with CXR with increasing pleural effusion.  Rx'd with nebulized bronchodilator and symptoms improved.  PCCM consulted to evaluate pleural effusion / Dyspnea.   PMH:  CAD -severe 2 vessel s/p CABG, HTN, DM, HLD, DM, CVA 2004, Hypothyroidism, Anemia, Diastolic Dysfunction, PAF / Atrial Flutter, COPD, severe TR / mod  MR on TEE 05/2012   LINES / TUBES: R PICC 1/14>>>  CULTURES: 1/8 MRSA PCR>>>  ANTIBIOTICS: Gentamicin 1/13 (for cath) >>>1/13 Vanco 1/13 (for cath)>>>1/13  SIGNIFICANT EVENTS:  1/14 Transfer to the ICU for increased work of breathing and need of BiPAP due to pulmonary edema.  INTERVAL HISTORY: Sitting up in bed, mild increase in respiratory effort  VITAL SIGNS: Temp:  [97.6 F (36.4 C)-98.3 F (36.8 C)] 97.7 F (36.5 C) (01/15 0800) Pulse Rate:  [43-147] 65  (01/15 0800) Resp:  [14-24] 14  (01/15 0800) BP: (97-181)/(32-74) 106/49 mmHg (01/15 0800) SpO2:  [93 %-100 %] 99 % (01/15 0800) Weight:  [101.2 kg (223 lb 1.7 oz)] 101.2 kg (223 lb 1.7 oz) (01/15 0458)  HEMODYNAMICS: CVP:  [21 mmHg-23 mmHg] 21 mmHg  VENTILATOR SETTINGS:  BiPAP  INTAKE / OUTPUT: Intake/Output      01/14 0701 - 01/15 0700 01/15 0701 - 01/16 0700   P.O. 800    I.V. (mL/kg) 490 (4.8)    Total Intake(mL/kg) 1290 (12.7)    Urine (mL/kg/hr) 2935 (1.2)    Total Output 2935    Net -1645          PHYSICAL EXAMINATION: General:  Elderly female with increased work of breathing and  more somnolent this AM after being given xanax. Neuro:  Awake, alert, oriented, MAE. HEENT:  Mm/pink moist. Cardiovascular:  s1s2 rrr, no m/r/g. Lungs:  resp's even, diffuse crackles R>L. Abdomen:  Obese/soft, bsx4 active. Musculoskeletal:  No acute deformities Skin:  Warm/dry, no clubbing or cyanosis  LABS: Cbc  Lab 07/18/12 0355 07/17/12 0429 07/16/12 0832  WBC 8.7 -- --  HGB 9.2* 9.2* 9.5*  HCT 28.4* 28.9* 29.4*  PLT 208 202 210   Chemistry  Lab 07/18/12 0355 07/17/12 1005 07/16/12 0832  NA 136 134* 135  K 3.9 5.0 4.8  CL 100 100 99  CO2 27 24 26   BUN 34* 33* 31*  CREATININE 2.06* 1.88* 1.87*  CALCIUM 9.3 9.0 9.1  MG 2.0 -- --  PHOS 3.9 -- --  GLUCOSE 126* 244* 223*   Liver fxn  Lab 07/13/12 0500  AST 23  ALT 14  ALKPHOS 111  BILITOT 0.4  PROT 7.3  ALBUMIN 2.7*   coags  Lab 07/18/12 0355 07/17/12 0429 07/16/12 0400  APTT -- -- --  INR 1.27 1.21 1.17   Cardiac markers  Lab 07/12/12 0230 07/11/12 2057 07/11/12 1537  CKTOTAL -- -- --  CKMB -- -- --  TROPONINI <0.30 <0.30 <0.30   BNP  Lab 07/14/12 0928 07/11/12 1537  PROBNP 2025.0* 2171.0*   ABG  Lab 07/17/12 1330  PHART 7.363  PCO2ART 41.2  PO2ART 107.0*  HCO3 23.4  TCO2 25   CBG trend  Lab 07/18/12 0822 07/17/12 2159 07/17/12 1621 07/17/12 1200 07/17/12 0722  GLUCAP 98 246* 212* 234* 212*    IMAGING: 1/14 CXR>>> same as below 1/14 R Decubitus>>>diffuse airspace disease, small- mod R effusion  ECG: Atrial Tach  DIAGNOSES: Principal Problem:  *Acute on chronic combined systolic and diastolic congestive heart failure, NYHA class 4 Active Problems:  Hypothyroidism  CAD (coronary artery disease)  Atrial tachycardia  CKD (chronic kidney disease) stage 3, GFR 30-59 ml/min  Diabetes mellitus  Cardiomyopathy-cause unknown ? rate related v ischemic  Acute respiratory failure  Pulmonary edema  Pleural effusion  ASSESSMENT / PLAN:  PULMONARY  ASSESSMENT: Dyspnea - in setting of  decompensated acute on chronic CHF -NYHA Class 4, atrial tachycardia, CKD & morbid obesity R Pleural Effusion  ? COPD - former smoker but quit >40 yrs ago  PLAN:   - PRN Nebulized Bronchodilators but caution with rate (albuterol) - Follow cxr - Assessed with bedside US not enough effusion inorder to tap, not enough to make a difference in this situation to warrant the risk. - BiPAP on a PRN bases for work of breathing. - See renal section for fluid management.  CARDIOVASCULAR  ASSESSMENT:  Atrial Tachycardia / Fib - on anti-coagulation Acute on chronic combined systolic and diastolic congestive heart failure CAD Cardiomyopathy  Severe TR Mod MR  PLAN:  - Hold diuresis today since contrast is going to be given with cardiac procedure today. - Trend BNP PNR. - Ablation and pacer implantation today. - Anticoagulation per pharmacy. - Hold Cardizem and decrease metoprolol to 25 mg PO BID to avoid reflex tachy.  RENAL  ASSESSMENT:   CKD III with hyperkalemia (borderline).  PLAN:   - IVF at NS 100 ml/hr. - D/C diureses. - BMET in AM. - If no contrast is needed or <50 ml are given will d/c IVF and begin diureses again in AM.  GASTROINTESTINAL  ASSESSMENT:   Morbid Obesity  PLAN:   - Consider Nutrition consult if intubated.  HEMATOLOGIC  ASSESSMENT:   Anemia - normocytic.  Likely of chronic disease.  No active bleeding Chronic Anticoagulation - in setting of Aflutter /Atach  PLAN:  - No indication for transfusion at this time. - Coumadin per pharmacy pending cardiac procedure.  INFECTIOUS  ASSESSMENT:   No indication of acute infectious process.   PLAN:   - Monitor fever curve/leukocytosis.  ENDOCRINE  ASSESSMENT:   DM Hypothyroidism   PLAN:   - SSI. - Synthroid.  NEUROLOGIC  ASSESSMENT:   Hx of Dementia - on aricept  PLAN:   - High risk for delirium, monitor mental status closely  CC time 35 min.  Alyson Reedy, M.D. [facility]  Pulmonary/Critical Care Medicine. Pager: [phone]. After hours pager: [phone].

## 2012-07-18 NOTE — Interval H&P Note (Signed)
History and Physical Interval Note:  07/18/2012 3:38 PM  [name]  has presented today for surgery, with the diagnosis of s  The various methods of treatment have been discussed with the patient and family. After consideration of risks, benefits and other options for treatment, the patient has consented to  Procedure(s) (LRB) with comments: BI-VENTRICULAR PACEMAKER INSERTION (CRT-P) (Bilateral) as a surgical intervention .  The patient's history has been reviewed, patient examined, no change in status, stable for surgery.  I have reviewed the patient's chart and labs.  Questions were answered to the patient's satisfaction.     Leonia Reeves.D.

## 2012-07-18 NOTE — Op Note (Signed)
Z#[id number].

## 2012-07-18 NOTE — Progress Notes (Signed)
Subjective: 51 AA F with MMP admitted for AFib c RVR and CHF. Breathing OK. More Azotemia. Pacer per Dr Graciela Husbands still being discussed CBGs  200's Some U retention last night and foley helped. DOE - Fluid balance has been difficult  Objective: Vital signs in last 24 hours: Temp:  [97.6 F (36.4 C)-98.3 F (36.8 C)] 98 F (36.7 C) (01/15 0400) Pulse Rate:  [43-147] 65  (01/15 0700) Resp:  [14-25] 14  (01/15 0700) BP: (97-181)/(32-74) 107/46 mmHg (01/15 0700) SpO2:  [93 %-100 %] 99 % (01/15 0700) Weight:  [101.2 kg (223 lb 1.7 oz)] 101.2 kg (223 lb 1.7 oz) (01/15 0458) Weight change: -2.4 kg (-5 lb 4.7 oz) Last BM Date: 07/16/12  CBG (last 3)   Basename 07/17/12 2159 07/17/12 1621 07/17/12 1200  GLUCAP 246* 212* 234*    Intake/Output from previous day:  Intake/Output Summary (Last 24 hours) at 07/18/12 0755 Last data filed at 07/18/12 0700  Gross per 24 hour  Intake   1290 ml  Output   2935 ml  Net  -1645 ml   01/14 0701 - 01/15 0700 In: 1290 [P.O.:800; I.V.:490] Out: 2935 [Urine:2935]   Physical Exam  General appearance: In bed in CCU - comfortable, in deep sleep. Resp: Distant but clear, min rales Cardio: reg rate controlled. NSR on monitor. GI: soft, non-tender; bowel sounds normal; no masses,  no organomegaly Extremities: min Edema   Lab Results:  Basename 07/18/12 0355 07/17/12 1005  NA 136 134*  K 3.9 5.0  CL 100 100  CO2 27 24  GLUCOSE 126* 244*  BUN 34* 33*  CREATININE 2.06* 1.88*  CALCIUM 9.3 9.0  MG 2.0 --  PHOS 3.9 --    No results found for this basename: AST:2,ALT:2,ALKPHOS:2,BILITOT:2,PROT:2,ALBUMIN:2 in the last 72 hours   Basename 07/18/12 0355 07/17/12 0429  WBC 8.7 7.4  NEUTROABS -- --  HGB 9.2* 9.2*  HCT 28.4* 28.9*  MCV 91.0 90.9  PLT 208 202    Lab Results  Component Value Date   INR 1.27 07/18/2012   INR 1.21 07/17/2012   INR 1.17 07/16/2012    No results found for this basename:  CKTOTAL:3,CKMB:3,CKMBINDEX:3,TROPONINI:3 in the last 72 hours  No results found for this basename: TSH,T4TOTAL,FREET3,T3FREE,THYROIDAB in the last 72 hours  No results found for this basename: VITAMINB12:2,FOLATE:2,FERRITIN:2,TIBC:2,IRON:2,RETICCTPCT:2 in the last 72 hours  Micro Results: Recent Results (from the past 240 hour(s))  MRSA PCR SCREENING     Status: Normal   Collection Time   07/11/12  2:32 PM      Component Value Range Status Comment   MRSA by PCR NEGATIVE  NEGATIVE Final      Studies/Results: Dg Chest Right Decubitus  07/17/2012  *RADIOLOGY REPORT*  Clinical Data: Shortness of breath, cough.  CHEST - RIGHT DECUBITUS  Comparison: 07/17/2012  Findings: Prior CABG.  There is cardiomegaly.  Vascular congestion. Diffuse right lung airspace disease, likely slightly increased since prior study.  Continued right effusion, stable.  IMPRESSION: Diffuse right lung airspace disease, slightly increased since prior study.  Small right effusion.   Original Report Authenticated By: Charlett Nose, M.D.    Dg Chest Port 1 View  07/17/2012  *RADIOLOGY REPORT*  Clinical Data: Shortness of breath, cough.  PORTABLE CHEST - 1 VIEW  Comparison: 07/13/2012  Findings: Prior CABG.  Cardiomegaly with vascular congestion. Right lower lobe airspace opacity is increased.  Probable small right effusion.  IMPRESSION: Increasing right lower lobe atelectasis or infiltrate.  Continue small right effusion.  Original Report Authenticated By: Charlett Nose, M.D.      Medications: Scheduled:    . amitriptyline  75 mg Oral QHS  . aspirin EC  81 mg Oral Daily  . atorvastatin  40 mg Oral QPM  . donepezil  5 mg Oral QHS  . insulin aspart  0-15 Units Subcutaneous TID WC  . insulin glargine  25 Units Subcutaneous BID  . levothyroxine  100 mcg Oral QAC breakfast  . metoprolol succinate  100 mg Oral BID  . pantoprazole  80 mg Oral Q1200  . potassium chloride SA  20 mEq Oral BID  . sodium chloride  10-40 mL  Intracatheter Q12H  . sodium chloride  3 mL Intravenous Q12H  . vancomycin  1,500 mg Intravenous On Call  . Warfarin - Pharmacist Dosing Inpatient   Does not apply q1800   Continuous:    . diltiazem (CARDIZEM) infusion 10 mg/hr (07/17/12 0257)     Assessment/Plan: Principal Problem:  *Acute on chronic combined systolic and diastolic congestive heart failure, NYHA class 4 Active Problems:  Hypothyroidism  CAD (coronary artery disease)  Atrial tachycardia  CKD (chronic kidney disease) stage 3, GFR 30-59 ml/min  Diabetes mellitus  Cardiomyopathy-cause unknown ? rate related v ischemic  Acute respiratory failure  Pulmonary edema  Pleural effusion  PAfib - For Ablation and  Pacer ? When medically stable.  CHF still being tuned up but still with sig DOE/Dyspnea/R Pl Effusion. Edema way down/Cr up.  Pulm CCM was consulted and their note on chart ECHO: The cavity size was normal. The estimated ejection fraction was 30%. Features are consistent with a pseudonormal left ventricular filling pattern, with concomitant abnormal relaxation and increased filling pressure (grade 2 diastolic dysfunction). - Ventricular septum: Septal motion showed moderate paradox. These changes are consistent with a left bundle branch block. - Aortic valve: Thickening. Severe diffuse thickening and calcification. There was mild stenosis. Valve area: 1.08cm^2(VTI). Valve area: 1cm^2 (Vmax).  Worsening Azotemia - with the diuresis.  Metalozone on hold at current time.  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  Continue Lantus/ISS - Increase Lantus 30 BID   Basename 07/17/12 2159 07/17/12 1621 07/17/12 1200  GLUCAP 246* 212* 234*   DVT Prophylaxis/Antticoagulation- Off coumadin for procedure. Heparin off for procedure.  Dementia/Chronic memory loss - follow, lives with  husband.  Her memory issues contributed to her using her insulin in R groin causing R groin Abscess a few months ago - she is better.  Chronic Anemia - Hbg baseline @ 10 and dropped 7.7 but was lab error.  Stable now in 9's. She is HD stable.   LOS: 7 days   [name] M 07/18/2012, 7:55 AM

## 2012-07-18 NOTE — Progress Notes (Signed)
Calling dr. Ladona Ridgel to address coumadin

## 2012-07-18 NOTE — Op Note (Signed)
BiV PPM insertion via the left subclavian vein without immediate complication. Very difficult procedure. AV node ablation tomorrow if leads ok.

## 2012-07-18 NOTE — Progress Notes (Signed)
Called dr. Molli Knock.  Decrease ivf to 50cc/hr.  Continue to hold lasix.

## 2012-07-18 NOTE — Progress Notes (Signed)
Patient Name: [name]      SUBJECTIVE: sleepy  Past Medical History  Diagnosis Date  . Coronary artery disease     a. Severe two vessel; s/p CABG;  b. 05/2012 NSTEMI in setting of rapid aflutter  . Hypertension   . Diabetes mellitus     Type 2  . Hyperlipidemia   . Diabetic neuropathy   . PAF (paroxysmal atrial fibrillation)     a. Failed medical therapy with amiodarone; not a candidate for class IC meds due to her CAD. No Multaq due to CHF hx; QT prolonged with amiodarone; Not able to be ablated due to valve disease  . CVA (cerebral vascular accident) 2004  . Hypothyroidism   . Chronic anemia   . Diastolic dysfunction     per echo in May of 2012  . GERD (gastroesophageal reflux disease)   . Arthritis   . Memory loss, short term   . COPD (chronic obstructive pulmonary disease)   . Blood transfusion without reported diagnosis   . Chronic combined systolic and diastolic CHF (congestive heart failure)     a. 05/2012 Echo: EF 30%, diff HK, Mild AS, MR, Mod TR, PASP .  . Moderate mitral regurgitation     a. mod by TEE 2012, mild by echo 2013  . Severe tricuspid regurgitation     a. Severe by TEE 2012, mod by echo 2013  . Atrial flutter     a. 05/2012 -> rate controlled, coumadin resumed.    PHYSICAL EXAM Filed Vitals:   07/18/12 0458 07/18/12 0500 07/18/12 0600 07/18/12 0700  BP:  108/49 105/46 107/46  Pulse:  65 65 65  Temp:      TempSrc:      Resp:  15 14 14   Height:      Weight: 223 lb 1.7 oz (101.2 kg)     SpO2:  100% 100% 99%    Sleepy Regular rate  lkungs clear anteriorly, but too sleepy to listen laterally  No edema  SCDs in place TELEMETRY: Reviewed telemetry pt in atrial tach    Intake/Output Summary (Last 24 hours) at 07/18/12 0828 Last data filed at 07/18/12 0700  Gross per 24 hour  Intake   1010 ml  Output   2935 ml  Net  -1925 ml    LABS: Basic Metabolic Panel:  Lab 07/18/12 1914 07/17/12 1005 07/16/12 0832 07/16/12 0400  07/15/12 0414 07/14/12 0608 07/13/12 0500  NA 136 134* 135 135 138 137 136  K 3.9 5.0 4.8 4.5 3.9 4.0 3.8  CL 100 100 99 101 100 101 100  CO2 27 24 26 28 28 26 26   GLUCOSE 126* 244* 223* 257* 137* 143* 255*  BUN 34* 33* 31* 28* 26* 26* 29*  CREATININE 2.06* 1.88* 1.87* 1.82* 1.78* 1.53* 1.65*  CALCIUM 9.3 9.0 -- -- -- -- --  MG 2.0 -- -- -- -- -- --  PHOS 3.9 -- -- -- -- -- --   Cardiac Enzymes: No results found for this basename: CKTOTAL:3,CKMB:3,CKMBINDEX:3,TROPONINI:3 in the last 72 hours CBC:  Lab 07/18/12 0355 07/17/12 0429 07/16/12 0832 07/16/12 0400 07/15/12 0414 07/14/12 0608 07/11/12 1537  WBC 8.7 7.4 7.4 6.4 8.2 7.5 8.5  NEUTROABS -- -- -- -- -- -- 5.2  HGB 9.2* 9.2* 9.5* 7.7* 10.0* 10.1* 10.7*  HCT 28.4* 28.9* 29.4* 24.0* 31.8* 31.9* 33.1*  MCV 91.0 90.9 91.0 91.6 91.4 91.1 90.9  PLT 208 202 210 181 226 216 208   PROTIME:  Basename 07/18/12 0355 07/17/12 0429 07/16/12 0400  LABPROT 15.6* 15.1 14.7  INR 1.27 1.21 1.17   Liver Function Tests: No results found for this basename: AST:2,ALT:2,ALKPHOS:2,BILITOT:2,PROT:2,ALBUMIN:2 in the last 72 hours No results found for this basename: LIPASE:2,AMYLASE:2 in the last 72 hours BNP: BNP (last 3 results)  Basename 07/14/12 0928 07/11/12 1537 05/26/12 0147  PROBNP 2025.0* 2171.0* 2740.0*   D-Dimer:   ASSESSMENT AND PLAN:  Patient Active Hospital Problem List: Acute on chronic combined systolic and diastolic congestive heart failure, NYHA class 4 (07/12/2012)   Atrial tachycardia (05/30/2011) Chronic kidney disease) stage 3, acute on chronic   Cardiomyopathy-cause unknown ? rate related v ischemic (07/12/2012)   Acute respiratory failure (07/17/2012)  Pulmonary saw the patient yesterday because of pleural effusions and dyspnea. She was given vigorous diuresis with a loss of 2+ liters;  her creatinine this morning has moved over 2  I spoke with her husband extensively (greater than 30 minutes) along with Dr. Timothy Lasso  regarding the potential implications of worsening renal function and trying to balance it with worsening pulmonary function. We have discussed the issues of contrast nephropathy and a likely consequence of increasing creatinine from her diuretics, the value of fluids and try to reduce the risk of contrast nephropathy and issues of her pulmonary status.  We outlined 3 options. 1-proceed at this moment with associated risks, 2-hydrate preprocedure and proceeded later today 3-cancel to try to further optimize hemodynamics. We have elected to pursue option #2. Mr. Goyal is aware of potential risks use of contrast nephropathy. We will anticipate using minimal contrast; in the event that 2 leads are placed would anticipate proceeding with AV junction ablation. It's just a single lead is placed AV junction ablation may be postponed per 24 hours.   Will use SCDs         Signed, Sherryl Manges MD  07/18/2012

## 2012-07-19 ENCOUNTER — Inpatient Hospital Stay (HOSPITAL_COMMUNITY): Payer: Medicare Other

## 2012-07-19 ENCOUNTER — Encounter (HOSPITAL_COMMUNITY)
Admission: AD | Disposition: A | Discharge status: Home / Self Care | Payer: Self-pay | Source: Ambulatory Visit | Attending: Cardiology

## 2012-07-19 DIAGNOSIS — I4891 Unspecified atrial fibrillation: Secondary | ICD-10-CM

## 2012-07-19 HISTORY — PX: AV NODE ABLATION: SHX5458

## 2012-07-19 LAB — PHOSPHORUS: Phosphorus: 3.3 mg/dL (ref 2.3–4.6)

## 2012-07-19 LAB — BASIC METABOLIC PANEL
CO2: 27 mEq/L (ref 19–32)
Calcium: 9 mg/dL (ref 8.4–10.5)
Chloride: 102 mEq/L (ref 96–112)
Creatinine, Ser: 1.69 mg/dL — ABNORMAL HIGH (ref 0.50–1.10)
Glucose, Bld: 151 mg/dL — ABNORMAL HIGH (ref 70–99)
Sodium: 137 mEq/L (ref 135–145)

## 2012-07-19 LAB — CBC
Hemoglobin: 9.2 g/dL — ABNORMAL LOW (ref 12.0–15.0)
MCH: 29.6 pg (ref 26.0–34.0)
MCV: 92 fL (ref 78.0–100.0)
RBC: 3.11 MIL/uL — ABNORMAL LOW (ref 3.87–5.11)
WBC: 8.5 10*3/uL (ref 4.0–10.5)

## 2012-07-19 LAB — GLUCOSE, CAPILLARY
Glucose-Capillary: 90 mg/dL (ref 70–99)
Glucose-Capillary: 263 mg/dL — ABNORMAL HIGH (ref 70–99)

## 2012-07-19 LAB — MAGNESIUM: Magnesium: 1.9 mg/dL (ref 1.5–2.5)

## 2012-07-19 SURGERY — AV NODE ABLATION
Anesthesia: LOCAL

## 2012-07-19 MED ORDER — SODIUM CHLORIDE 0.9 % IJ SOLN: 3.0000 mL | INTRAMUSCULAR | Status: DC | PRN

## 2012-07-19 MED ORDER — MIDAZOLAM HCL 5 MG/5ML IJ SOLN
INTRAMUSCULAR | Status: AC
  Filled 2012-07-19: qty 5

## 2012-07-19 MED ORDER — WARFARIN SODIUM 7.5 MG PO TABS
7.5000 mg | ORAL_TABLET | Freq: Once | ORAL | Status: AC
  Administered 2012-07-19: 7.5 mg via ORAL
  Filled 2012-07-19: qty 1

## 2012-07-19 MED ORDER — METOLAZONE 5 MG PO TABS
5.0000 mg | ORAL_TABLET | Freq: Every day | ORAL | Status: AC
  Administered 2012-07-19: 5 mg via ORAL
  Filled 2012-07-19: qty 1

## 2012-07-19 MED ORDER — POTASSIUM CHLORIDE CRYS ER 20 MEQ PO TBCR
40.0000 meq | EXTENDED_RELEASE_TABLET | Freq: Once | ORAL | Status: AC
  Administered 2012-07-19: 40 meq via ORAL

## 2012-07-19 MED ORDER — FENTANYL CITRATE 0.05 MG/ML IJ SOLN
INTRAMUSCULAR | Status: AC
  Filled 2012-07-19: qty 2

## 2012-07-19 MED ORDER — ACETAMINOPHEN 325 MG PO TABS
650.0000 mg | ORAL_TABLET | ORAL | Status: DC | PRN
  Administered 2012-07-20: 650 mg via ORAL
  Filled 2012-07-19: qty 2

## 2012-07-19 MED ORDER — METOPROLOL SUCCINATE ER 50 MG PO TB24
50.0000 mg | ORAL_TABLET | Freq: Two times a day (BID) | ORAL | Status: DC
  Administered 2012-07-19: 25 mg via ORAL
  Administered 2012-07-19 – 2012-07-26 (×14): 50 mg via ORAL
  Filled 2012-07-19 (×16): qty 1

## 2012-07-19 MED ORDER — FUROSEMIDE 10 MG/ML IJ SOLN
5.0000 mg/h | INTRAVENOUS | Status: AC
  Administered 2012-07-19: 5 mg/h via INTRAVENOUS
  Filled 2012-07-19: qty 25

## 2012-07-19 MED ORDER — SODIUM CHLORIDE 0.9 % IJ SOLN
3.0000 mL | Freq: Two times a day (BID) | INTRAMUSCULAR | Status: DC
Start: 2012-07-19 — End: 2012-07-19

## 2012-07-19 MED ORDER — ACETAZOLAMIDE SODIUM 500 MG IJ SOLR
250.0000 mg | Freq: Three times a day (TID) | INTRAMUSCULAR | Status: AC
  Administered 2012-07-19 (×2): 250 mg via INTRAVENOUS
  Filled 2012-07-19 (×2): qty 500

## 2012-07-19 MED ORDER — ONDANSETRON HCL 4 MG/2ML IJ SOLN: 4.0000 mg | Freq: Four times a day (QID) | INTRAMUSCULAR | Status: DC | PRN

## 2012-07-19 MED ORDER — SODIUM CHLORIDE 0.9 % IV SOLN
250.0000 mL | INTRAVENOUS | Status: DC | PRN
  Administered 2012-07-19: 10 mL via INTRAVENOUS

## 2012-07-19 MED ORDER — LIDOCAINE HCL (PF) 1 % IJ SOLN
INTRAMUSCULAR | Status: AC
  Filled 2012-07-19: qty 30

## 2012-07-19 NOTE — Op Note (Signed)
[name], [name] NO.:  [id number]  MEDICAL RECORD NO.:  [id number]  LOCATION:  2906                         FACILITY:  [facility]  PHYSICIAN:  Doylene Canning. Ladona Ridgel, MD    DATE OF BIRTH:  1933/05/26  DATE OF PROCEDURE:  07/18/2012 DATE OF DISCHARGE:                              OPERATIVE REPORT   PROCEDURE PERFORMED:  Insertion of a biventricular pacemaker.  INDICATION:  Ischemic cardiomyopathy with rapid atrial fibrillation and uncontrolled ventricular response for AV node ablation and biventricular pacemaker insertion.  Ejection fraction was 35%.  INTRODUCTION:  The patient is a 79 year old woman who presented to the hospital with heart failure, with very difficult to control ventricular rates and atrial fibrillation/flutter.  The patient is now referred for biventricular pacemaker insertion as her ejection fraction was 35%, and AV node ablation.  PROCEDURE:  After informed consent was obtained, the patient was taken to the Diagnostic EP Lab in the fasting state.  After usual preparation and draping, intravenous fentanyl and midazolam was given for sedation. A 30 mL of lidocaine was infiltrated into the left infraclavicular region.  A 7-cm incision was carried out over this region, and electrocautery was utilized to dissect down to the fascial plane.  The left subclavian vein was punctured x3 and the St. Jude model 2088T 52-cm active fixation pacing lead, serial number [id number] was advanced into the right ventricle and the St. Jude model 2088T 46-cm active fixation pacing lead, serial number [id number] was advanced to the right atrium. Mapping was carried out in the right atrium and the right ventricle.  At the final site, the R-waves were 5, the P-waves were 1, the impedance was 550 in the ventricle and 360 in the atrium.  The threshold was 0.5 at 0.4 in the right ventricle, 1.5 at 0.4 on the right atrium.  It should be noted that P-waves were low throughout the  right atrium.  With these satisfactory parameters, attention was then turned to placement of the left ventricular lead.  The coronary sinus guiding catheter was advanced into the coronary sinus and venography was carried out.  This demonstrated the middle cardiac vein, which had collateralized to the lateral vein.  The lateral vein had two branches.  It was extraordinarily tortuous with two 90-degree bends.  Initial attempts to advance angioplasty guidewires into the very tortuous lateral vein were unsuccessful due to the tortuosity of the vein.  After approximately 45 minutes of attempting to cannulate the lateral vein, the middle cardiac vein was cannulated and through a collateral, which was very large.  The 0.014 guidewire along with the St. Jude QuickFlex, model 1258T bipolar pacing lead, serial number [id number] was advanced retrograde into the lateral vein.  At the final site, the pacing threshold was 2 volts at 0.8 milliseconds.  There were no diaphragmatic stimulation.  At these satisfactory parameters, the lead was liberated from the guiding catheter in the usual manner and the leads were secured to the subpectoral fascia with figure-of-eight silk suture.  Sewing sleeve was secured with silk suture.  Electrocautery was utilized to make a subcutaneous pocket.  Antibiotic irrigation was utilized to irrigate the pocket and electrocautery was utilized to assure hemostasis.  The St. Jude Anthem RF BiV pacemaker, serial number [id number] was connected to the atrial RV and LV leads and placed back in the subcutaneous pocket.  The pocket was irrigated with antibiotic irrigation and the incision was closed with 2-0 and 3-0 Vicryl.  Benzoin and Steri-Strips were painted on the skin.  A pressure dressing was applied, and the patient was returned to her room in satisfactory condition.  COMPLICATIONS:  There were no immediate procedure complications.  RESULTS:  This demonstrate successful  implantation of a biventricular pacemaker.  AV node ablation was not carried out because of the long nature of the procedure and because of concern that the LV lead was not going to be as stable as we would like.  For this reason, we will plan to observe the patient overnight with AV node ablation in the next 24 hours.     Doylene Canning. Ladona Ridgel, MD     GWT/MEDQ  D:  07/18/2012  T:  07/19/2012  Job:  [id number]  cc:   [name], M.D. Duke Salvia, MD, [facility]

## 2012-07-19 NOTE — Progress Notes (Signed)
Patient ID: [name], female   DOB: 05-08-1933, 79 y.o.   MRN: [id number] Subjective:  "I slept well last night"  Objective:  Vital Signs in the last 24 hours: Temp:  [97.6 F (36.4 C)-99.4 F (37.4 C)] 99.4 F (37.4 C) (01/16 0341) Pulse Rate:  [65-81] 81  (01/16 0600) Resp:  [14-22] 14  (01/16 0600) BP: (99-135)/(19-81) 125/56 mmHg (01/16 0600) SpO2:  [93 %-100 %] 94 % (01/16 0600) Weight:  [220 lb 3.8 oz (99.9 kg)] 220 lb 3.8 oz (99.9 kg) (01/16 0447)  Intake/Output from previous day: 01/15 0701 - 01/16 0700 In: 2197.2 [P.O.:240; I.V.:1457.2; IV Piggyback:500] Out: 2730 [Urine:2730] Intake/Output from this shift: Total I/O In: 1190 [P.O.:240; I.V.:450; IV Piggyback:500] Out: 1880 [Urine:1880]  Physical Exam: Well appearing NAD HEENT: Unremarkable Neck:  No JVD, no thyromegally Lungs:  Clear with no wheezes, no hematoma HEART:  Regular rate rhythm, no murmurs, no rubs, no clicks Abd:  Flat, positive bowel sounds, no organomegally, no rebound, no guarding Ext:  2 plus pulses, no edema, no cyanosis, no clubbing Skin:  No rashes no nodules Neuro:  CN II through XII intact, motor grossly intact  Lab Results:  Basename 07/19/12 0500 07/18/12 0355  WBC 8.5 8.7  HGB 9.2* 9.2*  PLT 197 208    Basename 07/19/12 0500 07/18/12 0355  NA 137 136  K 4.5 3.9  CL 102 100  CO2 27 27  GLUCOSE 151* 126*  BUN 31* 34*  CREATININE 1.69* 2.06*   No results found for this basename: TROPONINI:2,CK,MB:2 in the last 72 hours Hepatic Function Panel No results found for this basename: PROT,ALBUMIN,AST,ALT,ALKPHOS,BILITOT,BILIDIR,IBILI in the last 72 hours No results found for this basename: CHOL in the last 72 hours No results found for this basename: PROTIME in the last 72 hours  Imaging: Dg Chest Right Decubitus  07/17/2012  *RADIOLOGY REPORT*  Clinical Data: Shortness of breath, cough.  CHEST - RIGHT DECUBITUS  Comparison: 07/17/2012  Findings: Prior CABG.  There is  cardiomegaly.  Vascular congestion. Diffuse right lung airspace disease, likely slightly increased since prior study.  Continued right effusion, stable.  IMPRESSION: Diffuse right lung airspace disease, slightly increased since prior study.  Small right effusion.   Original Report Authenticated By: Charlett Nose, M.D.    Dg Chest Port 1 View  07/18/2012  *RADIOLOGY REPORT*  Clinical Data: Respiratory failure  PORTABLE CHEST - 1 VIEW  Comparison: Chest radiograph 07/17/2012  Findings: Right PICC line is unchanged.  Stable enlarged heart silhouette.  There is increased bilateral pleural effusions. Potential bibasilar air space disease.  IMPRESSION: Increased bilateral pleural effusions and cardiomegaly suggest congestive heart failure.   Original Report Authenticated By: Genevive Bi, M.D.    Dg Chest Port 1 View  07/17/2012  *RADIOLOGY REPORT*  Clinical Data: Shortness of breath, cough.  PORTABLE CHEST - 1 VIEW  Comparison: 07/13/2012  Findings: Prior CABG.  Cardiomegaly with vascular congestion. Right lower lobe airspace opacity is increased.  Probable small right effusion.  IMPRESSION: Increasing right lower lobe atelectasis or infiltrate.  Continue small right effusion.   Original Report Authenticated By: Charlett Nose, M.D.     Cardiac Studies: Tele- nsr Assessment/Plan:  1. Symptomatic tachy-brady 2. PAF 3. Acute on chronic combined systolic/diastolic CHF 4. Chronic renal insufficiency 5. S/p BiV PPM Rec: cxr looks like lead is in place. Will plan to proceed with AV node ablation this morning once we have checked/interogated her leads.  LOS: 8 days    Gregg Taylor,M.D. 07/19/2012,  6:51 AM

## 2012-07-19 NOTE — Progress Notes (Signed)
Pt rate 110 and paced.  notifed Counselling psychologist.  She is contacting rep to come look at device.

## 2012-07-19 NOTE — Progress Notes (Signed)
Pt paced at 112.  Called and notified dr. Ladona Ridgel.  Orders received.

## 2012-07-19 NOTE — Op Note (Signed)
EP Procedure Note  After informed consent obtained, patient prepped and sedated in the usual manner with fentanyl and versed. A 7 Fr. Ablation catheter was inserted percutaneously in the right femoral vein and advanced to the RA. Mapping of the AV node was carried out. HV interval was 35 ms. A single RF energy application was applied to the AV node resulting in CHB. She tolerated the procedure well and was observed for 15 min. She had no residual AV node conduction. Her BiV PPM was reprogrammed to DDD 90/min.   Complications: none immediately  Conclusion: successful AV node ablation in a patient s/p BiV PPM without immediate complication.  [name].D.

## 2012-07-19 NOTE — Progress Notes (Signed)
ANTICOAGULATION CONSULT NOTE - Follow Up Consult  Pharmacy Consult for coumadin Indication: atrial fibrillation  Allergies  Allergen Reactions  . Betadine (Povidone Iodine) Itching  . Capoten (Captopril) Other (See Comments)    unknown  . Methadone   . Penicillins Swelling  . Propoxyphene And Methadone     Intolerance to darvocet    Patient Measurements: Height: 5\' 3"  (160 cm) Weight: 220 lb 3.8 oz (99.9 kg) IBW/kg (Calculated) : 52.4   Vital Signs: Temp: 98.4 F (36.9 C) (01/16 1139) Temp src: Oral (01/16 1139) BP: 115/53 mmHg (01/16 1200) Pulse Rate: 109  (01/16 1200)  Labs:  Basename 07/19/12 0500 07/19/12 0445 07/18/12 0355 07/17/12 1005 07/17/12 0429 07/16/12 2106  HGB 9.2* -- 9.2* -- -- --  HCT 28.6* -- 28.4* -- 28.9* --  PLT 197 -- 208 -- 202 --  APTT -- -- -- -- -- --  LABPROT -- 16.2* 15.6* -- 15.1 --  INR -- 1.33 1.27 -- 1.21 --  HEPARINUNFRC -- -- 0.20* -- 0.45 0.31  CREATININE 1.69* -- 2.06* 1.88* -- --  CKTOTAL -- -- -- -- -- --  CKMB -- -- -- -- -- --  TROPONINI -- -- -- -- -- --    Estimated Creatinine Clearance: 31.4 ml/min (by C-G formula based on Cr of 1.69).   Medications:  Scheduled:     . acetaZOLAMIDE  250 mg Intravenous Q8H  . amitriptyline  75 mg Oral QHS  . aspirin EC  81 mg Oral Daily  . atorvastatin  40 mg Oral QPM  . donepezil  5 mg Oral QHS  . [COMPLETED] fentaNYL      . [COMPLETED] fentaNYL      . [COMPLETED] gentamicin irrigation  80 mg Irrigation On Call  . insulin aspart  0-15 Units Subcutaneous TID WC  . insulin glargine  30 Units Subcutaneous BID  . levothyroxine  100 mcg Oral QAC breakfast  . [COMPLETED] lidocaine      . [COMPLETED] lidocaine      . [COMPLETED] metolazone  5 mg Oral Daily  . metoprolol succinate  50 mg Oral BID  . [COMPLETED] midazolam      . [COMPLETED] midazolam      . pantoprazole  80 mg Oral Q1200  . [COMPLETED] potassium chloride  40 mEq Oral Once  . sodium chloride  10-40 mL Intracatheter  Q12H  . sodium chloride  3 mL Intravenous Q12H  . [COMPLETED] vancomycin  1,500 mg Intravenous Q12H  . [COMPLETED] vancomycin  1,000 mg Intravenous On Call  . warfarin  7.5 mg Oral ONCE-1800  . Warfarin - Pharmacist Dosing Inpatient   Does not apply q1800  . [DISCONTINUED] insulin glargine  20 Units Subcutaneous BID  . [DISCONTINUED] metoprolol succinate  25 mg Oral BID  . [DISCONTINUED] potassium chloride SA  20 mEq Oral BID  . [DISCONTINUED] sodium chloride  3 mL Intravenous Q12H  . [DISCONTINUED] sodium chloride  3 mL Intravenous Q12H  . [DISCONTINUED] vancomycin  1,500 mg Intravenous On Call  . [DISCONTINUED] warfarin  5-7.5 mg Oral Daily  . [DISCONTINUED] Warfarin - Pharmacist Dosing Inpatient   Does not apply q1800    Assessment: 79 yo female with history of afib on coumadin PTA. INR= 1.33 and s/p pacer and AV ablation. Per Dr. Ladona Ridgel no heparin now and continue coumadin.  Home coumadin dose: 5mg  Tuesday and Thursday; 7.5mg  the rest of the week  Goal of Therapy:  INR= 2-2.5 Monitor platelets by anticoagulation protocol: Yes  Plan:  -Coumadin 7.5mg  po today -Daily PT/INR  Harland German, Pharm D 07/19/2012 12:35 PM

## 2012-07-19 NOTE — Progress Notes (Signed)
Name: [name] MRN: [id number] DOB: 1932/10/03 LOS: 8  PCCM RESIDENT DAILY PROGRESS NOTE  History of Present Illness:  79 y/o BF with PMH of complicated medical history who presented to Cardiology office on 1/8 with a 2 week hx of palpitations / heart racing, dyspnea at rest & exertion, easily tired, increased weight of 16 lbs, non-productive cough. Admitted for significant decompensation of CHF. 1/14 pt noted to have increased SOB with CXR with increasing pleural effusion. Rx'd with nebulized bronchodilator and symptoms improved. PCCM consulted to evaluate pleural effusion / Dyspnea.  PMH: CAD -severe 2 vessel s/p CABG, HTN, DM, HLD, DM, CVA 2004, Hypothyroidism, Anemia, Diastolic Dysfunction, PAF / Atrial Flutter, COPD, severe TR / mod MR on TEE 05/2012  LINES / TUBES:  R PICC 1/14>>>   CULTURES:  1/8 MRSA PCR>>>   ANTIBIOTICS:  Gentamicin 1/13 (for cath) >>>1/13  Vanco 1/13 (for cath)>>>1/13   SIGNIFICANT EVENTS:  1/14 Transfer to the ICU for increased work of breathing and need of BiPAP due to pulmonary edema.  1/15 S/p Biventricular pacer  Overnight Events:  S/p Biventricular pacer 1/15   Vital Signs: Temp:  [97.6 F (36.4 C)-99.4 F (37.4 C)] 99.4 F (37.4 C) (01/16 0341) Pulse Rate:  [65-81] 81  (01/16 0600) Resp:  [14-22] 14  (01/16 0600) BP: (99-135)/(19-81) 125/56 mmHg (01/16 0600) SpO2:  [93 %-100 %] 94 % (01/16 0600) Weight:  [220 lb 3.8 oz (99.9 kg)] 220 lb 3.8 oz (99.9 kg) (01/16 0447) I/O last 3 completed shifts: In: 2537.2 [P.O.:360; I.V.:1677.2; IV Piggyback:500] Out: 5115 [Urine:5115]  Physical Examination: General: Elderly female with increased work of breathing and more somnolent this AM after being given xanax.  Neuro: Awake, alert, oriented, MAE.  HEENT: Mm/pink moist.  Cardiovascular: s1s2 rrr, no m/r/g.  Lungs: resp's even, diffuse crackles R>L.  Abdomen: Obese/soft, bsx4 active.  Musculoskeletal: No acute deformities  Skin: Warm/dry,  no clubbing or cyanosis  Ventilator settings:    Basename 07/17/12 1330  PHART 7.363  PO2ART 107.0*  TCO2 25  HCO3 23.4   Labs and Imaging:   Basic Metabolic Panel:  Lab 07/19/12 4098 07/18/12 0355  NA 137 136  K 4.5 3.9  CL 102 100  CO2 27 27  GLUCOSE 151* 126*  BUN 31* 34*  CREATININE 1.69* 2.06*  CALCIUM 9.0 9.3  MG 1.9 2.0  PHOS 3.3 3.9   Liver Function Tests:  Lab 07/13/12 0500  AST 23  ALT 14  ALKPHOS 111  BILITOT 0.4  PROT 7.3  ALBUMIN 2.7*    CBC:  Lab 07/19/12 0500 07/18/12 0355  WBC 8.5 8.7  NEUTROABS -- --  HGB 9.2* 9.2*  HCT 28.6* 28.4*  MCV 92.0 91.0  PLT 197 208    BNP:  Lab 07/14/12 0928  PROBNP 2025.0*   CBG:  Lab 07/18/12 2143 07/18/12 1635 07/18/12 1137 07/18/12 0822 07/17/12 2159 07/17/12 1621  GLUCAP 170* 78 108* 98 246* 212*    Coagulation:  Lab 07/19/12 0445 07/18/12 0355 07/17/12 0429 07/16/12 0400  LABPROT 16.2* 15.6* 15.1 14.7  INR 1.33 1.27 1.21 1.17    Assessment and Plan: PULMONARY  ASSESSMENT:  Dyspnea - in setting of decompensated acute on chronic CHF -NYHA Class 4, atrial tachycardia, CKD & morbid obesity  R Pleural Effusion  ? COPD - former smoker but quit >40 yrs ago   Assessed with bedside US not enough effusion inorder to tap, not enough to make a difference in this situation to warrant the  risk.  CXRay reviewed  PLAN:  - PRN Nebulized Bronchodilators but caution with rate (albuterol)  - Follow cxr  - BiPAP on a PRN bases for work of breathing.  - See renal section for fluid management.  - Lasix drip.  CARDIOVASCULAR  ASSESSMENT:  Atrial Tachycardia / Fib - on anti-coagulation  Acute on chronic combined systolic and diastolic congestive heart failure EF 30 % CAD  Cardiomyopathy  Severe TR  Mod MR  S/p Biventricular pacer 1/15  D/c Cardizem 1/15 PLAN:  - Trend BNP PNR.  - Ablation today   - Anticoagulation per pharmacy.  - Continue metoprolol t XL o 25 mg PO BID.  RENAL  ASSESSMENT:    CKD III with hyperkalemia (borderline).  PLAN:  - KVO IVF. - BMET in AM.  - If no contrast is needed or <50 ml are given will d/c IVF and begin diureses again in AM.  - Lasix drip. - Zaroxolyn. - Acetazolamide.  GASTROINTESTINAL  ASSESSMENT:  Morbid Obesity  PLAN:  - Consider Nutrition consult if intubated.   HEMATOLOGIC  ASSESSMENT:  Anemia - normocytic. Likely of chronic disease. No active bleeding  Chronic Anticoagulation - in setting of Aflutter /Atach  PLAN:  - No indication for transfusion at this time.  - Coumadin per pharmacy pending cardiac procedure.   INFECTIOUS  ASSESSMENT:   Received Vanc and Gentamicin x1 after pacemaker placement  PLAN:  - Monitor fever curve/leukocytosis.   ENDOCRINE  ASSESSMENT:  DM  Hypothyroidism  PLAN:  - SSI.  - Synthroid.   NEUROLOGIC  ASSESSMENT:  Hx of Dementia - on aricept  PLAN:  - High risk for delirium, monitor mental status closely   Best practices / Disposition: -->ICU status  -->full code  -->Warfain  for DVT Px -->Protonix for GI Px -->diet  - NPO bedside  [name],[name] 07/19/2012, 7:11 AM  Start lasix drip to avert respiratory failure.  Add acetazolamide and zaroxolyn, KVO IVF, BiPAP as needed.  The patient is critically ill with multiple organ systems failure and requires high complexity decision making for assessment and support, frequent evaluation and titration of therapies, application of advanced monitoring technologies and extensive interpretation of multiple databases. Critical Care Time devoted to patient care services described in this note is 35 minutes.  Alyson Reedy, M.D.  [facility] Pulmonary/Critical Care Medicine.  Pager: [phone].  After hours pager: [phone].

## 2012-07-19 NOTE — Progress Notes (Signed)
Patient evaluated for long-term disease management services with [facility] Care Management Program. Patient will receive a post discharge transition of care call and monthly home visits for assessments and for education. Spoke with patient at bedside to explain services. Patient states she lives with husband and she has had [facility] in the past. Reports discharge plan is to return home. Agreeable to [facility] Care Management services, consents signed at bedside. Explained how services will not interfere or replace home health services that are arranged by inpatient case management. Left [facility] Care Management packet along with contact information at bedside.       Gerre Scull [facility] Liaison [phone]

## 2012-07-20 ENCOUNTER — Other Ambulatory Visit: Payer: BC Managed Care – PPO | Admitting: Lab

## 2012-07-20 ENCOUNTER — Ambulatory Visit: Payer: BC Managed Care – PPO

## 2012-07-20 LAB — COMPREHENSIVE METABOLIC PANEL
ALT: 18 U/L (ref 0–35)
AST: 31 U/L (ref 0–37)
CO2: 27 mEq/L (ref 19–32)
Calcium: 8.8 mg/dL (ref 8.4–10.5)
Chloride: 97 mEq/L (ref 96–112)
Creatinine, Ser: 1.76 mg/dL — ABNORMAL HIGH (ref 0.50–1.10)
GFR calc Af Amer: 31 mL/min — ABNORMAL LOW (ref >90)
GFR calc non Af Amer: 27 mL/min — ABNORMAL LOW (ref >90)
Glucose, Bld: 228 mg/dL — ABNORMAL HIGH (ref 70–99)
Sodium: 135 mEq/L (ref 135–145)
Total Bilirubin: 0.7 mg/dL (ref 0.3–1.2)

## 2012-07-20 LAB — GLUCOSE, CAPILLARY
Glucose-Capillary: 205 mg/dL — ABNORMAL HIGH (ref 70–99)
Glucose-Capillary: 215 mg/dL — ABNORMAL HIGH (ref 70–99)

## 2012-07-20 LAB — CBC
HCT: 27.5 % — ABNORMAL LOW (ref 36.0–46.0)
Hemoglobin: 8.9 g/dL — ABNORMAL LOW (ref 12.0–15.0)
MCHC: 32.4 g/dL (ref 30.0–36.0)
MCV: 91.7 fL (ref 78.0–100.0)
RDW: 16 % — ABNORMAL HIGH (ref 11.5–15.5)

## 2012-07-20 LAB — PHOSPHORUS: Phosphorus: 3.2 mg/dL (ref 2.3–4.6)

## 2012-07-20 MED ORDER — INSULIN GLARGINE 100 UNIT/ML ~~LOC~~ SOLN
35.0000 [IU] | Freq: Two times a day (BID) | SUBCUTANEOUS | Status: DC
  Administered 2012-07-20 – 2012-07-22 (×5): 35 [IU] via SUBCUTANEOUS

## 2012-07-20 MED ORDER — INSULIN GLARGINE 100 UNIT/ML ~~LOC~~ SOLN: 5.0000 [IU] | Freq: Two times a day (BID) | SUBCUTANEOUS | Status: DC

## 2012-07-20 MED ORDER — WARFARIN SODIUM 7.5 MG PO TABS
7.5000 mg | ORAL_TABLET | Freq: Once | ORAL | Status: AC
  Administered 2012-07-20: 7.5 mg via ORAL
  Filled 2012-07-20: qty 1

## 2012-07-20 NOTE — Progress Notes (Signed)
Subjective: 9 AA F with MMP admitted for AFib c RVR and CHF. Breathing better, less LE Edema, Cr better on current regimen and S/P AV Node ablation and BiV Pacer CBGs  200's and still hi Making good UOP/ foley has helped. Leg cramps noted  Objective: Vital signs in last 24 hours: Temp:  [98.1 F (36.7 C)-99.5 F (37.5 C)] 98.5 F (36.9 C) (01/17 0731) Pulse Rate:  [108-120] 118  (01/17 0900) Resp:  [15-22] 19  (01/17 0900) BP: (100-123)/(53-85) 107/56 mmHg (01/17 0900) SpO2:  [92 %-96 %] 94 % (01/17 0900) Weight:  [94.3 kg (207 lb 14.3 oz)] 94.3 kg (207 lb 14.3 oz) (01/17 0450) Weight change: -5.6 kg (-12 lb 5.5 oz) Last BM Date: 07/16/12  CBG (last 3)   Basename 07/20/12 0729 07/19/12 2142 07/19/12 1539  GLUCAP 205* 263* 225*    Intake/Output from previous day:  Intake/Output Summary (Last 24 hours) at 07/20/12 1102 Last data filed at 07/20/12 1011  Gross per 24 hour  Intake 1249.75 ml  Output   9060 ml  Net -7810.25 ml   01/16 0701 - 01/17 0700 In: 1427.3 [P.O.:1115; I.V.:312.3] Out: 8060 [Urine:8060]   Physical Exam  General appearance: In chair in CCU - comfortable, looks the best she has looked in 2 weeks. Resp: Distant but clear Cardio: reg and paced controlled.. New Pacer noted GI: soft, non-tender; bowel sounds normal; no masses,  no organomegaly Extremities: 0 Edema   Lab Results:  Basename 07/20/12 0410 07/19/12 0500  NA 135 137  K 3.5 4.5  CL 97 102  CO2 27 27  GLUCOSE 228* 151*  BUN 30* 31*  CREATININE 1.76* 1.69*  CALCIUM 8.8 9.0  MG 1.7 1.9  PHOS 3.2 3.3     Basename 07/20/12 0410  AST 31  ALT 18  ALKPHOS 116  BILITOT 0.7  PROT 8.0  ALBUMIN 3.1*     Basename 07/20/12 0410 07/19/12 0500  WBC 7.7 8.5  NEUTROABS -- --  HGB 8.9* 9.2*  HCT 27.5* 28.6*  MCV 91.7 92.0  PLT 193 197    Lab Results  Component Value Date   INR 1.31 07/20/2012   INR 1.33 07/19/2012   INR 1.27 07/18/2012    No results found for this basename:  CKTOTAL:3,CKMB:3,CKMBINDEX:3,TROPONINI:3 in the last 72 hours  No results found for this basename: TSH,T4TOTAL,FREET3,T3FREE,THYROIDAB in the last 72 hours  No results found for this basename: VITAMINB12:2,FOLATE:2,FERRITIN:2,TIBC:2,IRON:2,RETICCTPCT:2 in the last 72 hours  Micro Results: Recent Results (from the past 240 hour(s))  MRSA PCR SCREENING     Status: Normal   Collection Time   07/11/12  2:32 PM      Component Value Range Status Comment   MRSA by PCR NEGATIVE  NEGATIVE Final      Studies/Results: Dg Chest Port 1 View  07/19/2012  *RADIOLOGY REPORT*  Clinical Data: Respiratory failure, post pacemaker insertion  PORTABLE CHEST - 1 VIEW  Comparison: Portable chest x-ray of 07/18/2012  Findings: The lungs are not well aerated with probable effusions right greater than left and minimal pulmonary vascular congestion. Cardiomegaly is stable.  A dual lead permanent pacemaker is now present and no pneumothorax is seen. Right PICC line is unchanged in position.  IMPRESSION: Permanent pacemaker now present.  No change in poor aeration with effusions and probable pulmonary vascular congestion.   Original Report Authenticated By: Dwyane Dee, M.D.      Medications: Scheduled:    . amitriptyline  75 mg Oral QHS  . aspirin  EC  81 mg Oral Daily  . atorvastatin  40 mg Oral QPM  . donepezil  5 mg Oral QHS  . insulin aspart  0-15 Units Subcutaneous TID WC  . insulin glargine  30 Units Subcutaneous BID  . levothyroxine  100 mcg Oral QAC breakfast  . metoprolol succinate  50 mg Oral BID  . pantoprazole  80 mg Oral Q1200  . sodium chloride  10-40 mL Intracatheter Q12H  . sodium chloride  3 mL Intravenous Q12H  . warfarin  7.5 mg Oral ONCE-1800  . Warfarin - Pharmacist Dosing Inpatient   Does not apply q1800   Continuous:     Assessment/Plan: Principal Problem:  *Acute on chronic combined systolic and diastolic congestive heart failure, NYHA class 4 Active Problems:  Hypothyroidism   CAD (coronary artery disease)  Atrial tachycardia  CKD (chronic kidney disease) stage 3, GFR 30-59 ml/min  Diabetes mellitus  Cardiomyopathy-cause unknown ? rate related v ischemic  Acute respiratory failure  Pulmonary edema  Pleural effusion  PAfib - S/p Ablation and  Pacer - doing well  CHF still being tuned up on lasix gtt about to be converted back to scheduled lasix.  Edema way down/Cr finally stable.    Azotemia - due to the diuresis.  Metalozone on hold. Acetazolamide finished. 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  Continue Lantus/ISS - Increase Lantus 35 BID   Basename 07/20/12 0729 07/19/12 2142 07/19/12 1539  GLUCAP 205* 263* 225*   DVT Prophylaxis/Antticoagulation- Coumadin  Acute on Chronic Anemia - Hbg baseline @ 10 and stable @ 9. She is HD stable.   LOS: 9 days   [name] M 07/20/2012, 11:02 AM

## 2012-07-20 NOTE — Progress Notes (Signed)
ANTICOAGULATION CONSULT NOTE - Follow Up Consult  Pharmacy Consult for coumadin Indication: atrial fibrillation  Allergies  Allergen Reactions  . Betadine (Povidone Iodine) Itching  . Capoten (Captopril) Other (See Comments)    unknown  . Methadone   . Penicillins Swelling  . Propoxyphene And Methadone     Intolerance to darvocet    Patient Measurements: Height: 5\' 3"  (160 cm) Weight: 207 lb 14.3 oz (94.3 kg) IBW/kg (Calculated) : 52.4   Vital Signs: Temp: 98.5 F (36.9 C) (01/17 0731) Temp src: Oral (01/17 0731) BP: 110/63 mmHg (01/17 0600) Pulse Rate: 117  (01/17 0600)  Labs:  Basename 07/20/12 0410 07/19/12 0500 07/19/12 0445 07/18/12 0355  HGB 8.9* 9.2* -- --  HCT 27.5* 28.6* -- 28.4*  PLT 193 197 -- 208  APTT -- -- -- --  LABPROT 16.0* -- 16.2* 15.6*  INR 1.31 -- 1.33 1.27  HEPARINUNFRC -- -- -- 0.20*  CREATININE 1.76* 1.69* -- 2.06*  CKTOTAL -- -- -- --  CKMB -- -- -- --  TROPONINI -- -- -- --    Estimated Creatinine Clearance: 29.2 ml/min (by C-G formula based on Cr of 1.76).   Medications:  Scheduled:     . [COMPLETED] acetaZOLAMIDE  250 mg Intravenous Q8H  . amitriptyline  75 mg Oral QHS  . aspirin EC  81 mg Oral Daily  . atorvastatin  40 mg Oral QPM  . donepezil  5 mg Oral QHS  . insulin aspart  0-15 Units Subcutaneous TID WC  . insulin glargine  30 Units Subcutaneous BID  . levothyroxine  100 mcg Oral QAC breakfast  . [COMPLETED] metolazone  5 mg Oral Daily  . metoprolol succinate  50 mg Oral BID  . pantoprazole  80 mg Oral Q1200  . [COMPLETED] potassium chloride  40 mEq Oral Once  . sodium chloride  10-40 mL Intracatheter Q12H  . sodium chloride  3 mL Intravenous Q12H  . [COMPLETED] warfarin  7.5 mg Oral ONCE-1800  . Warfarin - Pharmacist Dosing Inpatient   Does not apply q1800  . [DISCONTINUED] potassium chloride SA  20 mEq Oral BID  . [DISCONTINUED] sodium chloride  3 mL Intravenous Q12H  . [DISCONTINUED] warfarin  7.5 mg Oral  ONCE-1800    Assessment: 79 yo female with history of afib on coumadin PTA. INR= 1.31 and s/p pacer and AV ablation. Per Dr. Ladona Ridgel no heparin now and continue coumadin. No coumadin charted on 1/15. CBC low stable.   Home coumadin dose: 5mg  Tuesday and Thursday; 7.5mg  the rest of the week  Goal of Therapy:  INR= 2-2.5 Monitor platelets by anticoagulation protocol: Yes   Plan:  -Repeat Coumadin 7.5mg  po today -Daily PT/INR  Thank you,  [name], PharmD, BCPS 07/20/2012 8:53 AM

## 2012-07-20 NOTE — Progress Notes (Signed)
Name: [name] MRN: [id number] DOB: 04-02-1933 LOS: 9  PCCM RESIDENT DAILY PROGRESS NOTE  History of Present Illness:  79 y/o BF with PMH of complicated medical history who presented to Cardiology office on 1/8 with a 2 week hx of palpitations / heart racing, dyspnea at rest & exertion, easily tired, increased weight of 16 lbs, non-productive cough. Admitted for significant decompensation of CHF. 1/14 pt noted to have increased SOB with CXR with increasing pleural effusion. Rx'd with nebulized bronchodilator and symptoms improved. PCCM consulted to evaluate pleural effusion / Dyspnea.  PMH: CAD -severe 2 vessel s/p CABG, HTN, DM, HLD, DM, CVA 2004, Hypothyroidism, Anemia, Diastolic Dysfunction, PAF / Atrial Flutter, COPD, severe TR / mod MR on TEE 05/2012   LINES / TUBES:  R PICC 1/14>>>   CULTURES:  1/8 MRSA PCR>>> negative   ANTIBIOTICS:  Gentamicin 1/13 (for cath) >>>1/13  Vanco 1/13 (for cath)>>>1/13   SIGNIFICANT EVENTS:  1/14 Transfer to the ICU for increased work of breathing and need of BiPAP due to pulmonary edema.  1/15 S/p Biventricular pacer 1/16 Ablation     Overnight Events:  HR in the 110s. Pacer had to interrogated.    Overall doing better  Vital Signs: Temp:  [98.1 F (36.7 C)-99.5 F (37.5 C)] 98.5 F (36.9 C) (01/17 0731) Pulse Rate:  [108-120] 117  (01/17 0600) Resp:  [13-22] 18  (01/17 0300) BP: (100-132)/(53-85) 110/63 mmHg (01/17 0600) SpO2:  [92 %-98 %] 93 % (01/17 0600) Weight:  [207 lb 14.3 oz (94.3 kg)] 207 lb 14.3 oz (94.3 kg) (01/17 0450) I/O last 3 completed shifts: In: 2652.3 [P.O.:1355; I.V.:797.3; IV Piggyback:500] Out: 9940 [Urine:9940]  Physical Examination: General: Sitting up and eating breakfast  Neuro: Awake, alert,  HEENT: Mm/pink moist.  Cardiovascular: s1s2 rrr, no m/r/g.  Lungs: resp's even, improved  Crackle  Abdomen: Obese/soft, bsx4 active.  Musculoskeletal: No acute deformities  Skin: Warm/dry, no clubbing or  cyanosis        Basename 07/17/12 1330  PHART 7.363  PO2ART 107.0*  TCO2 25  HCO3 23.4   Labs and Imaging:   Basic Metabolic Panel:  Lab 07/20/12 4010 07/19/12 0500  NA 135 137  K 3.5 4.5  CL 97 102  CO2 27 27  GLUCOSE 228* 151*  BUN 30* 31*  CREATININE 1.76* 1.69*  CALCIUM 8.8 9.0  MG 1.7 1.9  PHOS 3.2 3.3   Liver Function Tests:  Lab 07/20/12 0410  AST 31  ALT 18  ALKPHOS 116  BILITOT 0.7  PROT 8.0  ALBUMIN 3.1*    CBC:  Lab 07/20/12 0410 07/19/12 0500  WBC 7.7 8.5  NEUTROABS -- --  HGB 8.9* 9.2*  HCT 27.5* 28.6*  MCV 91.7 92.0  PLT 193 197    BNP:  Lab 07/20/12 0410 07/14/12 0928  PROBNP 2508.0* 2025.0*    CBG:  Lab 07/20/12 0729 07/19/12 2142 07/19/12 1539 07/19/12 1137 07/19/12 0847 07/19/12 0714  GLUCAP 205* 263* 225* 160* 90 116*    Coagulation:  Lab 07/20/12 0410 07/19/12 0445 07/18/12 0355 07/17/12 0429  LABPROT 16.0* 16.2* 15.6* 15.1  INR 1.31 1.33 1.27 1.21    Assessment and Plan: Dyspnea - in setting of decompensated acute on chronic CHF -NYHA Class 4, atrial tachycardia, CKD & morbid obesity  R Pleural Effusion  ? COPD - former smoker but quit >40 yrs ago  Assessed with bedside US not enough effusion inorder to tap, not enough to make a difference in this situation  to warrant the risk.  Improved resp status after lasix drip  PLAN:  - PRN Nebulized Bronchodilators but caution with rate (albuterol)  - Follow cxr in am  - See renal section for fluid management.   CARDIOVASCULAR  ASSESSMENT:  Atrial Tachycardia / Fib - on anti-coagulation  Acute on chronic combined systolic and diastolic congestive heart failure EF 30 %  CAD  Cardiomyopathy  Severe TR  Mod MR  S/p Biventricular pacer 1/15  D/c Cardizem 1/15  S/p AV node Ablation 1/16 BNP 2508<2025 <2171  PLAN:  - Trend BNP PNR.  - Anticoagulation per pharmacy.  - Metoprolol XL  increased to   50 mg PO BID.  - Per Cardiology   RENAL  ASSESSMENT:  CKD III with  hyperkalemia (borderline).  Urine output: 8 liters/24 h  PLAN:  - consider to NSL  - BMET in AM.  - d/c Lasix drip. And change to Lasix  IV bid for now  - continue Zaroxolyn  And Acetazolamide for now   GASTROINTESTINAL  ASSESSMENT:  Morbid Obesity  PLAN:  Carb modified   HEMATOLOGIC  ASSESSMENT:  Anemia - normocytic. Likely of chronic disease. No active bleeding  Chronic Anticoagulation - in setting of Aflutter /Atach  PLAN:  - No indication for transfusion at this time.  - Coumadin per pharmacy pending cardiac procedure.   INFECTIOUS  ASSESSMENT:  Received Vanc and Gentamicin x1 after pacemaker placement  PLAN:  - Monitor fever curve/leukocytosis.   ENDOCRINE  ASSESSMENT:  DM  Hypothyroidism  PLAN:  - SSI.  - Synthroid.   NEUROLOGIC  ASSESSMENT:  Hx of Dementia - on aricept  PLAN:  - High risk for delirium, monitor mental status closely   Best practices / Disposition:  -->ICU status  -->full code  -->Warfain for DVT Px  -->Protonix for GI Px  -->diet - carb modified    Summary : Improved resp status . Good Urine output . Consider to d/c lasix drip and change to  Lasix IV bid .  Will sign off. Appreciate for the consult. Please call for any question.   [name],[name] 07/20/2012, 8:28 AM  Patient much improved, will need additional diureses however.  BiPAP will be d/ced, lasix drip will be d/ced and change to IV BID.  Continue to diurese as renal function tolerates.  PCCM will sign off, please call back if needed.  No need for a thora.  Patient seen and examined, agree with above note.  I dictated the care and orders written for this patient under my direction.  Koren Bound, M.D. [phone]

## 2012-07-20 NOTE — Progress Notes (Signed)
Inpatient Diabetes Program Recommendations  AACE/ADA: New Consensus Statement on Inpatient Glycemic Control (2013)  Target Ranges:  Prepandial:   less than 140 mg/dL      Peak postprandial:   less than 180 mg/dL (1-2 hours)      Critically ill patients:  140 - 180 mg/dL   Diabetes Coordinator was following up on patient's noon CBG and discovered that Dr. Timothy Lasso changed Lantus to 5 units BID.  Patient was previously ordered 30 units BID.  Dr. Timothy Lasso stated in his note that he would increase Lantus to 35 BID.  This Coordinator called Dr. Ferd Hibbs office and spoke with Dr. Link Snuffer to clarify the order.  He gave telephone order to change Lantus to 35 units BID.  Orders entered. Thank you  Piedad Climes BSN, RN,CDE Inpatient Diabetes Coordinator [phone] (team pager)

## 2012-07-20 NOTE — Progress Notes (Signed)
Patient ID: [name], female   DOB: 1933/05/29, 79 y.o.   MRN: [id number] Subjective:  No chest pain or sob. I feel weak.  Objective:  Vital Signs in the last 24 hours: Temp:  [98.1 F (36.7 C)-99.5 F (37.5 C)] 98.1 F (36.7 C) (01/17 1128) Pulse Rate:  [94-120] 94  (01/17 1300) Resp:  [15-22] 20  (01/17 1300) BP: (101-123)/(55-85) 120/72 mmHg (01/17 1300) SpO2:  [92 %-96 %] 96 % (01/17 1300) Weight:  [207 lb 14.3 oz (94.3 kg)] 207 lb 14.3 oz (94.3 kg) (01/17 0450)  Intake/Output from previous day: 01/16 0701 - 01/17 0700 In: 1427.3 [P.O.:1115; I.V.:312.3] Out: 8060 [Urine:8060] Intake/Output from this shift: Total I/O In: 715 [P.O.:600; I.V.:115] Out: 1000 [Urine:1000]  Physical Exam: Weak appearing NAD HEENT: Unremarkable Neck:  7 cm JVD, no thyromegally Lungs:  Clear except for basilar rales HEART:  Regular rate rhythm, no murmurs, no rubs, no clicks Abd:  Flat, positive bowel sounds, no organomegally, no rebound, no guarding Ext:  2 plus pulses, no edema, no cyanosis, no clubbing Skin:  No rashes no nodules Neuro:  CN II through XII intact, motor grossly intact  Lab Results:  Basename 07/20/12 0410 07/19/12 0500  WBC 7.7 8.5  HGB 8.9* 9.2*  PLT 193 197    Basename 07/20/12 0410 07/19/12 0500  NA 135 137  K 3.5 4.5  CL 97 102  CO2 27 27  GLUCOSE 228* 151*  BUN 30* 31*  CREATININE 1.76* 1.69*   No results found for this basename: TROPONINI:2,CK,MB:2 in the last 72 hours Hepatic Function Panel  Basename 07/20/12 0410  PROT 8.0  ALBUMIN 3.1*  AST 31  ALT 18  ALKPHOS 116  BILITOT 0.7  BILIDIR --  IBILI --   No results found for this basename: CHOL in the last 72 hours No results found for this basename: PROTIME in the last 72 hours  Imaging: Dg Chest Port 1 View  07/19/2012  *RADIOLOGY REPORT*  Clinical Data: Respiratory failure, post pacemaker insertion  PORTABLE CHEST - 1 VIEW  Comparison: Portable chest x-ray of 07/18/2012  Findings:  The lungs are not well aerated with probable effusions right greater than left and minimal pulmonary vascular congestion. Cardiomegaly is stable.  A dual lead permanent pacemaker is now present and no pneumothorax is seen. Right PICC line is unchanged in position.  IMPRESSION: Permanent pacemaker now present.  No change in poor aeration with effusions and probable pulmonary vascular congestion.   Original Report Authenticated By: Dwyane Dee, M.D.     Cardiac Studies: Tele - atrial tachy with appropriate tracking then NSR. Assessment/Plan:  1. Atrial tachycardia - we have paced her back to nsr. If she has recurrent atrial tachy then she may need additional anti-arrhythmic drug therapy.  2. S/p BiV PPM/AV node ablation - both stable in function. 3. Stage 2 renal insufficiency 4. Chronic diastolic CHF Rec: advance activity. Follow rhythm, and use anti-arrhythmic drug therapy as needed.follow renal function.   LOS: 9 days    Gregg Taylor,M.D. 07/20/2012, 2:29 PM

## 2012-07-21 ENCOUNTER — Inpatient Hospital Stay (HOSPITAL_COMMUNITY): Payer: Medicare Other

## 2012-07-21 LAB — GLUCOSE, CAPILLARY
Glucose-Capillary: 232 mg/dL — ABNORMAL HIGH (ref 70–99)
Glucose-Capillary: 258 mg/dL — ABNORMAL HIGH (ref 70–99)

## 2012-07-21 LAB — BASIC METABOLIC PANEL
BUN: 37 mg/dL — ABNORMAL HIGH (ref 6–23)
Calcium: 9.1 mg/dL (ref 8.4–10.5)
Chloride: 93 mEq/L — ABNORMAL LOW (ref 96–112)
Creatinine, Ser: 1.95 mg/dL — ABNORMAL HIGH (ref 0.50–1.10)
GFR calc Af Amer: 27 mL/min — ABNORMAL LOW (ref >90)
GFR calc non Af Amer: 24 mL/min — ABNORMAL LOW (ref >90)

## 2012-07-21 MED ORDER — WARFARIN SODIUM 10 MG PO TABS
10.0000 mg | ORAL_TABLET | Freq: Once | ORAL | Status: AC
  Administered 2012-07-21: 10 mg via ORAL
  Filled 2012-07-21: qty 1

## 2012-07-21 MED ORDER — POTASSIUM CHLORIDE 10 MEQ/50ML IV SOLN
10.0000 meq | INTRAVENOUS | Status: AC
  Administered 2012-07-21 (×2): 10 meq via INTRAVENOUS
  Filled 2012-07-21 (×2): qty 50

## 2012-07-21 NOTE — Progress Notes (Signed)
Pt arrived from 2900 A& 0x3.  Lt. Arm sling intact, encourage pt to keep sling on and to call for help  As needed.  Pt verbalized understanding.  VSS.  Amanda Pea, Charity fundraiser.

## 2012-07-21 NOTE — Progress Notes (Signed)
Report given to [facility] on 4700. Patient being transported to 4728 via wheelchair. Family is aware of her new room number.

## 2012-07-21 NOTE — Progress Notes (Signed)
Patient ID: [name], female   DOB: Aug 05, 1932, 79 y.o.   MRN: [id number]   Patient Name: [name] Date of Encounter: 07/21/2012    SUBJECTIVE  Offers no complaints today. Breathing better. Waiting on telemetry bed.  CURRENT MEDS    . amitriptyline  75 mg Oral QHS  . aspirin EC  81 mg Oral Daily  . atorvastatin  40 mg Oral QPM  . donepezil  5 mg Oral QHS  . insulin aspart  0-15 Units Subcutaneous TID WC  . insulin glargine  35 Units Subcutaneous BID  . levothyroxine  100 mcg Oral QAC breakfast  . metoprolol succinate  50 mg Oral BID  . pantoprazole  80 mg Oral Q1200  . potassium chloride  10 mEq Intravenous Q1 Hr x 2  . sodium chloride  10-40 mL Intracatheter Q12H  . sodium chloride  3 mL Intravenous Q12H  . Warfarin - Pharmacist Dosing Inpatient   Does not apply q1800    OBJECTIVE  Filed Vitals:   07/21/12 0500 07/21/12 0600 07/21/12 0650 07/21/12 0700  BP: 104/50 111/55  101/50  Pulse: 89 89 92 88  Temp:      TempSrc:      Resp: 13 19 15 19   Height:      Weight:      SpO2: 92% 94% 93% 95%    Intake/Output Summary (Last 24 hours) at 07/21/12 0905 Last data filed at 07/21/12 0600  Gross per 24 hour  Intake   1290 ml  Output   3895 ml  Net  -2605 ml   Filed Weights   07/19/12 0447 07/20/12 0450 07/21/12 0434  Weight: 220 lb 3.8 oz (99.9 kg) 207 lb 14.3 oz (94.3 kg) 199 lb 4.7 oz (90.4 kg)    PHYSICAL EXAM  General: Pleasant, NAD. Obese, pacer site clean her are RN, covered at present with clean dressing Neuro: Alert and oriented X 3. Moves all extremities spontaneously. Psych: Normal affect. HEENT:  Normal  Neck: Supple without bruits or JVD. Lungs:  Resp regular and unlabored, CTA. Heart: RRR no s3, s4, or murmurs, no rub Abdomen: Soft, non-tender, non-distended, BS + x 4.  Extremities: No clubbing, cyanosis or edema. DP/PT/Radials 2+ and equal bilaterally.  Accessory Clinical Findings  CBC  Basename 07/20/12 0410 07/19/12 0500    WBC 7.7 8.5  NEUTROABS -- --  HGB 8.9* 9.2*  HCT 27.5* 28.6*  MCV 91.7 92.0  PLT 193 197   Basic Metabolic Panel  Basename 07/21/12 0430 07/20/12 0410 07/19/12 0500  NA 134* 135 --  K 3.2* 3.5 --  CL 93* 97 --  CO2 30 27 --  GLUCOSE 175* 228* --  BUN 37* 30* --  CREATININE 1.95* 1.76* --  CALCIUM 9.1 8.8 --  MG -- 1.7 1.9  PHOS -- 3.2 3.3   Liver Function Tests  Basename 07/20/12 0410  AST 31  ALT 18  ALKPHOS 116  BILITOT 0.7  PROT 8.0  ALBUMIN 3.1*   No results found for this basename: LIPASE:2,AMYLASE:2 in the last 72 hours Cardiac Enzymes No results found for this basename: CKTOTAL:3,CKMB:3,CKMBINDEX:3,TROPONINI:3 in the last 72 hours BNP No components found with this basename: POCBNP:3 D-Dimer No results found for this basename: DDIMER:2 in the last 72 hours Hemoglobin A1C No results found for this basename: HGBA1C in the last 72 hours Fasting Lipid Panel No results found for this basename: CHOL,HDL,LDLCALC,TRIG,CHOLHDL,LDLDIRECT in the last 72 hours Thyroid Function Tests No results found for this basename:  TSH,T4TOTAL,FREET3,T3FREE,THYROIDAB in the last 72 hours  TELE  Normal sinus rhythm with V. pacing  ECG    Radiology/Studies  Dg Chest Right Decubitus  07/17/2012  *RADIOLOGY REPORT*  Clinical Data: Shortness of breath, cough.  CHEST - RIGHT DECUBITUS  Comparison: 07/17/2012  Findings: Prior CABG.  There is cardiomegaly.  Vascular congestion. Diffuse right lung airspace disease, likely slightly increased since prior study.  Continued right effusion, stable.  IMPRESSION: Diffuse right lung airspace disease, slightly increased since prior study.  Small right effusion.   Original Report Authenticated By: Charlett Nose, M.D.    Dg Chest Port 1 View  07/21/2012  *RADIOLOGY REPORT*  Clinical Data: Follow up vascular congestion  PORTABLE CHEST - 1 VIEW  Comparison: Prior chest x-ray 07/19/2012  Findings: Unchanged position of right upper extremity approach  PICC.  The PICC tip terminates at the mid superior vena cava.  Left IJ approach cardiac rhythm maintenance device with leads projecting over the right atrium, right ventricle and within the vein overlying the left ventricle.  Slightly improved pulmonary vascular congestion.  Stable cardiomegaly.  Small right pleural effusion has decreased in size.  There is residual right basilar atelectasis.  IMPRESSION:  1.  Interval resolution of pulmonary vascular congestion. 2.  Decreasing right pleural effusion with some persistent basilar atelectasis. 3.  Stable cardiomegaly.   Original Report Authenticated By: Malachy Moan, M.D.    Dg Chest Port 1 View  07/19/2012  *RADIOLOGY REPORT*  Clinical Data: Respiratory failure, post pacemaker insertion  PORTABLE CHEST - 1 VIEW  Comparison: Portable chest x-ray of 07/18/2012  Findings: The lungs are not well aerated with probable effusions right greater than left and minimal pulmonary vascular congestion. Cardiomegaly is stable.  A dual lead permanent pacemaker is now present and no pneumothorax is seen. Right PICC line is unchanged in position.  IMPRESSION: Permanent pacemaker now present.  No change in poor aeration with effusions and probable pulmonary vascular congestion.   Original Report Authenticated By: Dwyane Dee, M.D.    Dg Chest Port 1 View  07/18/2012  *RADIOLOGY REPORT*  Clinical Data: Respiratory failure  PORTABLE CHEST - 1 VIEW  Comparison: Chest radiograph 07/17/2012  Findings: Right PICC line is unchanged.  Stable enlarged heart silhouette.  There is increased bilateral pleural effusions. Potential bibasilar air space disease.  IMPRESSION: Increased bilateral pleural effusions and cardiomegaly suggest congestive heart failure.   Original Report Authenticated By: Genevive Bi, M.D.    Dg Chest Port 1 View  07/17/2012  *RADIOLOGY REPORT*  Clinical Data: Shortness of breath, cough.  PORTABLE CHEST - 1 VIEW  Comparison: 07/13/2012  Findings: Prior CABG.   Cardiomegaly with vascular congestion. Right lower lobe airspace opacity is increased.  Probable small right effusion.  IMPRESSION: Increasing right lower lobe atelectasis or infiltrate.  Continue small right effusion.   Original Report Authenticated By: Charlett Nose, M.D.    Dg Chest Port 1 View  07/13/2012  *RADIOLOGY REPORT*  Clinical Data: History of line placement.  PORTABLE CHEST - 1 VIEW  Comparison: 07/12/2012.  Findings: Tip of right brachiocephalic venous catheter terminates in the superior vena cava lower portion.  No pneumothorax is evident.  Left lung is free of infiltrates.  There is hazy opacity in the inferior right lung with element of atelectasis and associated right pleural effusion.  There is stable cardiac silhouette enlargement. The patient has undergone previous median sternotomy and coronary artery bypass grafting.  IMPRESSION: Tip of right brachiocephalic venous catheter terminates in the superior vena cava  lower portion.  No pneumothorax is evident. Stable cardiac silhouette enlargement.  Hazy opacity inferiorly on the right with atelectasis and associated right pleural effusion without significant change.   Original Report Authenticated By: Onalee Hua Call    Dg Chest Port 1 View  07/12/2012  *RADIOLOGY REPORT*  Clinical Data: CHF.  Short of breath.  PORTABLE CHEST - 1 VIEW  Comparison: 05/25/2012  Findings: Heart remains moderately enlarged.  Right pleural effusion and basilar pulmonary parenchymal opacity have developed. Minimal atelectasis at the left base.  Normal vascularity.  No pneumothorax.  IMPRESSION: Cardiomegaly.  Right pleural effusion and basilar consolidation.   Original Report Authenticated By: Jolaine Click, M.D.     ASSESSMENT AND PLAN  Principal Problem:  *Acute on chronic combined systolic and diastolic congestive heart failure, NYHA class 4 Active Problems:  Hypothyroidism  CAD (coronary artery disease)  Atrial tachycardia  CKD (chronic kidney disease) stage 3,  GFR 30-59 ml/min  Diabetes mellitus  Cardiomyopathy-cause unknown ? rate related v ischemic  Acute respiratory failure  Pulmonary edema  Pleural effusion    She is diarrhea significantly over the last 24 hours. Potassium has been supplemented. BUN and creatinine have increased. Chest x-ray shows clearing of her pulmonary edema. Right effusion is smaller. Meds reviewed. Discussed with Elita Quick, RN.  Will hold Lasix today. Check metabolic profile in the morning. Transfer to telemetry, orders written. Awaiting bed. Will increase activity as tolerated.  Signed, Valera Castle MD

## 2012-07-21 NOTE — Progress Notes (Signed)
ANTICOAGULATION CONSULT NOTE - Follow Up Consult  Pharmacy Consult for warfarin Indication: atrial fibrillation  Allergies  Allergen Reactions  . Betadine (Povidone Iodine) Itching  . Capoten (Captopril) Other (See Comments)    unknown  . Methadone   . Penicillins Swelling  . Propoxyphene And Methadone     Intolerance to darvocet    Patient Measurements: Height: 5\' 3"  (160 cm) Weight: 199 lb 4.7 oz (90.4 kg) IBW/kg (Calculated) : 52.4   Vital Signs: Temp: 98.5 F (36.9 C) (01/18 1200) Temp src: Oral (01/18 1200) BP: 102/60 mmHg (01/18 1300) Pulse Rate: 91  (01/18 1300)  Labs:  Basename 07/21/12 0430 07/20/12 0410 07/19/12 0500 07/19/12 0445  HGB -- 8.9* 9.2* --  HCT -- 27.5* 28.6* --  PLT -- 193 197 --  APTT -- -- -- --  LABPROT 15.6* 16.0* -- 16.2*  INR 1.27 1.31 -- 1.33  HEPARINUNFRC -- -- -- --  CREATININE 1.95* 1.76* 1.69* --  CKTOTAL -- -- -- --  CKMB -- -- -- --  TROPONINI -- -- -- --    Estimated Creatinine Clearance: 25.8 ml/min (by C-G formula based on Cr of 1.95).   Medications:  Scheduled:     . amitriptyline  75 mg Oral QHS  . aspirin EC  81 mg Oral Daily  . atorvastatin  40 mg Oral QPM  . donepezil  5 mg Oral QHS  . insulin aspart  0-15 Units Subcutaneous TID WC  . insulin glargine  35 Units Subcutaneous BID  . levothyroxine  100 mcg Oral QAC breakfast  . metoprolol succinate  50 mg Oral BID  . pantoprazole  80 mg Oral Q1200  . [COMPLETED] potassium chloride  10 mEq Intravenous Q1 Hr x 2  . sodium chloride  10-40 mL Intracatheter Q12H  . sodium chloride  3 mL Intravenous Q12H  . [COMPLETED] warfarin  7.5 mg Oral ONCE-1800  . Warfarin - Pharmacist Dosing Inpatient   Does not apply q1800  . [DISCONTINUED] insulin glargine  5 Units Subcutaneous BID    Assessment: 79 yo female with history of afib on coumadin PTA- dose at home was 5mg  on Tuesdays and Thursdays and 7.5mg  the rest of the week. Now s/p pacer and AV ablation. Per Dr. Ladona Ridgel no  heparin now and continue Coumadin. No coumadin charted on 1/15 which is reflected in today's INR of 1.27. INR has been slow to increase. CBC is low, but stable.  Goal of Therapy:  INR= 2-2.5 Monitor platelets by anticoagulation protocol: Yes   Plan:  -Coumadin 10mg  po x1 today -Daily PT/INR  Romari Gasparro D. Dannon Perlow, PharmD Clinical Pharmacist Pager: [phone] 07/21/2012 2:24 PM

## 2012-07-21 NOTE — Progress Notes (Signed)
Hypokalemia   K replaced  

## 2012-07-22 LAB — PROTIME-INR
INR: 1.48 (ref 0.00–1.49)
Prothrombin Time: 17.5 seconds — ABNORMAL HIGH (ref 11.6–15.2)

## 2012-07-22 LAB — GLUCOSE, CAPILLARY
Glucose-Capillary: 331 mg/dL — ABNORMAL HIGH (ref 70–99)
Glucose-Capillary: 249 mg/dL — ABNORMAL HIGH (ref 70–99)

## 2012-07-22 LAB — BASIC METABOLIC PANEL
CO2: 27 mEq/L (ref 19–32)
Calcium: 9 mg/dL (ref 8.4–10.5)
Sodium: 129 mEq/L — ABNORMAL LOW (ref 135–145)

## 2012-07-22 MED ORDER — ALTEPLASE 2 MG IJ SOLR
2.0000 mg | Freq: Once | INTRAMUSCULAR | Status: AC
  Administered 2012-07-22: 2 mg
  Filled 2012-07-22: qty 2

## 2012-07-22 MED ORDER — DOCUSATE SODIUM 100 MG PO CAPS
100.0000 mg | ORAL_CAPSULE | Freq: Two times a day (BID) | ORAL | Status: DC
  Administered 2012-07-22 – 2012-07-26 (×9): 100 mg via ORAL
  Filled 2012-07-22 (×11): qty 1

## 2012-07-22 MED ORDER — POTASSIUM CHLORIDE CRYS ER 20 MEQ PO TBCR
40.0000 meq | EXTENDED_RELEASE_TABLET | Freq: Once | ORAL | Status: AC
  Administered 2012-07-22: 40 meq via ORAL
  Filled 2012-07-22 (×2): qty 1

## 2012-07-22 MED ORDER — WARFARIN SODIUM 10 MG PO TABS
10.0000 mg | ORAL_TABLET | Freq: Once | ORAL | Status: AC
  Administered 2012-07-22: 10 mg via ORAL
  Filled 2012-07-22: qty 1

## 2012-07-22 NOTE — Progress Notes (Addendum)
ANTICOAGULATION CONSULT NOTE - Follow Up Consult  Pharmacy Consult for Coumadin Indication: atrial fibrillation  Allergies  Allergen Reactions  . Betadine (Povidone Iodine) Itching  . Capoten (Captopril) Other (See Comments)    unknown  . Methadone   . Penicillins Swelling  . Propoxyphene And Methadone     Intolerance to darvocet    Patient Measurements: Height: 5\' 3"  (160 cm) Weight: 203 lb 0.7 oz (92.1 kg) IBW/kg (Calculated) : 52.4   Vital Signs: Temp: 97 F (36.1 C) (01/19 0500) Temp src: Oral (01/19 0500) BP: 115/68 mmHg (01/19 0500) Pulse Rate: 96  (01/19 0500)  Labs:  Basename 07/22/12 0530 07/21/12 0430 07/20/12 0410  HGB -- -- 8.9*  HCT -- -- 27.5*  PLT -- -- 193  APTT -- -- --  LABPROT 17.5* 15.6* 16.0*  INR 1.48 1.27 1.31  HEPARINUNFRC -- -- --  CREATININE 1.87* 1.95* 1.76*  CKTOTAL -- -- --  CKMB -- -- --  TROPONINI -- -- --    Estimated Creatinine Clearance: 27.2 ml/min (by C-G formula based on Cr of 1.87).  Assessment: 79yof continues on coumadin for afib. INR remains subtherapeutic but is finally starting to trend up after a higher 10mg  dose was given last night. The vitamin K given 1/8 in the setting of renal insufficiency and the missed dose on 1/15 could both explain why her INR has been very slow to increase. No CBC today. No bleeding reported.  Goal of Therapy:  INR 2-2.5 Monitor platelets by anticoagulation protocol: Yes   Plan:  1) Repeat coumadin 10mg  x 1 2) Follow up INR in AM 3) Consider resuming home glimeperide as CBGs are elevated  Fredrik Rigger 07/22/2012,7:35 AM

## 2012-07-22 NOTE — Progress Notes (Signed)
PROGRESS NOTE  Subjective:   [name] is a 79 yo with long hx of AF, s/p recent AV node ablation with pacer implantation.  She is feeling much better.   Objective:    Vital Signs:   Temp:  [97 F (36.1 C)-98.5 F (36.9 C)] 98.4 F (36.9 C) (01/19 0945) Pulse Rate:  [84-96] 86  (01/19 0945) Resp:  [8-22] 16  (01/19 0500) BP: (98-133)/(53-80) 121/57 mmHg (01/19 0945) SpO2:  [92 %-100 %] 92 % (01/19 0500) Weight:  [203 lb 0.7 oz (92.1 kg)] 203 lb 0.7 oz (92.1 kg) (01/19 0500)  Last BM Date: 07/16/12   24-hour weight change: Weight change: 3 lb 12 oz (1.7 kg)  Weight trends: Filed Weights   07/20/12 0450 07/29/12 0434 07/22/12 0500  Weight: 207 lb 14.3 oz (94.3 kg) 199 lb 4.7 oz (90.4 kg) 203 lb 0.7 oz (92.1 kg)    Intake/Output:  2022/07/29 0701 - 01/19 0700 In: 910 [P.O.:860; IV Piggyback:50] Out: 950 [Urine:950] Total I/O In: 240 [P.O.:240] Out: -    Physical Exam: BP 121/57  Pulse 86  Temp 98.4 F (36.9 C) (Oral)  Resp 16  Ht 5\' 3"  (1.6 m)  Wt 203 lb 0.7 oz (92.1 kg)  BMI 35.97 kg/m2  SpO2 92%  General: Vital signs reviewed and noted.   Head: Normocephalic, atraumatic.  Eyes: conjunctivae/corneas clear.  EOM's intact.   Throat: normal  Neck:  normal.   Lungs:   clear  Heart:  RR, soft murmur, pacer in left subclavian area  Abdomen:  Soft, non-tender, non-distended    Extremities: No edema   Neurologic: A&O X3, CN II - XII are grossly intact.   Psych: Normal     Labs: BMET:  Basename 07/22/12 0530 07-29-12 0430 07/20/12 0410  NA 129* 134* --  K 3.2* 3.2* --  CL 90* 93* --  CO2 27 30 --  GLUCOSE 315* 175* --  BUN 44* 37* --  CREATININE 1.87* 1.95* --  CALCIUM 9.0 9.1 --  MG -- -- 1.7  PHOS -- -- 3.2    Liver function tests:  Basename 07/20/12 0410  AST 31  ALT 18  ALKPHOS 116  BILITOT 0.7  PROT 8.0  ALBUMIN 3.1*   No results found for this basename: LIPASE:2,AMYLASE:2 in the last 72 hours  CBC:  Basename 07/20/12 0410  WBC 7.7    NEUTROABS --  HGB 8.9*  HCT 27.5*  MCV 91.7  PLT 193    Cardiac Enzymes: No results found for this basename: CKTOTAL:4,CKMB:4,TROPONINI:4 in the last 72 hours  Coagulation Studies:  Basename 07/22/12 0530 07-29-12 0430 07/20/12 0410  LABPROT 17.5* 15.6* 16.0*  INR 1.48 1.27 1.31    Other: No components found with this basename: POCBNP:3 No results found for this basename: DDIMER in the last 72 hours No results found for this basename: HGBA1C in the last 72 hours No results found for this basename: CHOL,HDL,LDLCALC,TRIG,CHOLHDL in the last 72 hours No results found for this basename: TSH,T4TOTAL,FREET3,T3FREE,THYROIDAB in the last 72 hours No results found for this basename: VITAMINB12,FOLATE,FERRITIN,TIBC,IRON,RETICCTPCT in the last 72 hours   Other results:  Tele:  V paced  Medications:    Infusions:    Scheduled Medications:    . amitriptyline  75 mg Oral QHS  . aspirin EC  81 mg Oral Daily  . atorvastatin  40 mg Oral QPM  . donepezil  5 mg Oral QHS  . insulin aspart  0-15 Units Subcutaneous TID WC  .  insulin glargine  35 Units Subcutaneous BID  . levothyroxine  100 mcg Oral QAC breakfast  . metoprolol succinate  50 mg Oral BID  . pantoprazole  80 mg Oral Q1200  . potassium chloride  40 mEq Oral Once  . sodium chloride  10-40 mL Intracatheter Q12H  . sodium chloride  3 mL Intravenous Q12H  . warfarin  10 mg Oral ONCE-1800  . Warfarin - Pharmacist Dosing Inpatient   Does not apply q1800    Assessment/ Plan:     Acute on chronic combined systolic and diastolic congestive heart failure, NYHA class 4 (07/12/2012)  much better after A-Fib ablation and pacer implantation  Hypothyroidism (11/26/2010)  CAD (coronary artery disease) (11/26/2010)  Atrial tachycardia (05/30/2011)  CKD (chronic kidney disease) stage 3, GFR 30-59 ml/min (10/23/2011) Creatinine is 1.87  Diabetes mellitus ()  Cardiomyopathy-cause unknown ? rate related v ischemic  (07/12/2012)  Acute respiratory failure (07/17/2012)  Pleural effusion (07/17/2012)   Constipation:   Will give LOC - will start with colace May need to avoid mylanta given renal insufficiency.  Disposition:  Length of Stay: 27  [name], [name]., MD, [facility] 07/22/2012, 10:27 AM Office [phone] Pager [phone]

## 2012-07-22 NOTE — Progress Notes (Signed)
Spoke with [place] PA today regarding foley catheter to recommend dc of foley. Per Bjorn Loser, will continue foley catheter until PT evaluation done tomorrow to see recommendations made by PT. Will continue to monitor pt closely.  Juliane Lack, RN

## 2012-07-23 DIAGNOSIS — N189 Chronic kidney disease, unspecified: Secondary | ICD-10-CM

## 2012-07-23 DIAGNOSIS — D631 Anemia in chronic kidney disease: Secondary | ICD-10-CM

## 2012-07-23 LAB — CBC
HCT: 32.5 % — ABNORMAL LOW (ref 36.0–46.0)
Hemoglobin: 11.1 g/dL — ABNORMAL LOW (ref 12.0–15.0)
MCV: 89 fL (ref 78.0–100.0)
RBC: 3.65 MIL/uL — ABNORMAL LOW (ref 3.87–5.11)
RDW: 15.4 % (ref 11.5–15.5)
WBC: 9.6 10*3/uL (ref 4.0–10.5)

## 2012-07-23 LAB — TYPE AND SCREEN
Antibody Screen: POSITIVE
DAT, IgG: NEGATIVE
PT AG Type: NEGATIVE

## 2012-07-23 LAB — BASIC METABOLIC PANEL
Calcium: 9 mg/dL (ref 8.4–10.5)
GFR calc Af Amer: 34 mL/min — ABNORMAL LOW (ref >90)
GFR calc non Af Amer: 29 mL/min — ABNORMAL LOW (ref >90)
Glucose, Bld: 221 mg/dL — ABNORMAL HIGH (ref 70–99)
Potassium: 3.4 mEq/L — ABNORMAL LOW (ref 3.5–5.1)
Sodium: 132 mEq/L — ABNORMAL LOW (ref 135–145)

## 2012-07-23 LAB — GLUCOSE, CAPILLARY: Glucose-Capillary: 190 mg/dL — ABNORMAL HIGH (ref 70–99)

## 2012-07-23 LAB — GLUCOSE, RANDOM: Glucose, Bld: 474 mg/dL — ABNORMAL HIGH (ref 70–99)

## 2012-07-23 LAB — PROTIME-INR
INR: 1.77 — ABNORMAL HIGH (ref 0.00–1.49)
Prothrombin Time: 20 seconds — ABNORMAL HIGH (ref 11.6–15.2)

## 2012-07-23 MED ORDER — INSULIN GLARGINE 100 UNIT/ML ~~LOC~~ SOLN
37.0000 [IU] | Freq: Two times a day (BID) | SUBCUTANEOUS | Status: DC
  Administered 2012-07-23 – 2012-07-25 (×5): 37 [IU] via SUBCUTANEOUS

## 2012-07-23 MED ORDER — POTASSIUM CHLORIDE CRYS ER 20 MEQ PO TBCR
40.0000 meq | EXTENDED_RELEASE_TABLET | Freq: Once | ORAL | Status: AC
  Administered 2012-07-23: 40 meq via ORAL
  Filled 2012-07-23: qty 2

## 2012-07-23 MED ORDER — WARFARIN SODIUM 7.5 MG PO TABS
7.5000 mg | ORAL_TABLET | Freq: Once | ORAL | Status: AC
  Administered 2012-07-23: 7.5 mg via ORAL
  Filled 2012-07-23: qty 1

## 2012-07-23 NOTE — Progress Notes (Addendum)
Patient Name: [name] Date of Encounter: 07/23/2012     Principal Problem:  *Acute on chronic combined systolic and diastolic congestive heart failure, NYHA class 4 Active Problems:  Hypothyroidism  CAD (coronary artery disease)  Atrial tachycardia  CKD (chronic kidney disease) stage 3, GFR 30-59 ml/min  Diabetes mellitus  Cardiomyopathy-cause unknown ? rate related v ischemic  Acute respiratory failure  Pulmonary edema  Pleural effusion    SUBJECTIVE  She seems much improved.  Denies any chest pain.  No current complaints.  Notes of last night are noted.    CURRENT MEDS    . amitriptyline  75 mg Oral QHS  . aspirin EC  81 mg Oral Daily  . atorvastatin  40 mg Oral QPM  . docusate sodium  100 mg Oral BID  . donepezil  5 mg Oral QHS  . insulin aspart  0-15 Units Subcutaneous TID WC  . insulin glargine  37 Units Subcutaneous BID  . levothyroxine  100 mcg Oral QAC breakfast  . metoprolol succinate  50 mg Oral BID  . pantoprazole  80 mg Oral Q1200  . sodium chloride  10-40 mL Intracatheter Q12H  . sodium chloride  3 mL Intravenous Q12H  . Warfarin - Pharmacist Dosing Inpatient   Does not apply q1800    OBJECTIVE  Filed Vitals:   07/22/12 0945 07/22/12 1624 07/22/12 2039 07/23/12 0350  BP: 121/57 110/63 130/53 137/63  Pulse: 86 85 85 87  Temp: 98.4 F (36.9 C) 98.5 F (36.9 C) 98.6 F (37 C) 98.6 F (37 C)  TempSrc: Oral Oral Oral Oral  Resp:  18 18 20   Height:      Weight:    201 lb 3.2 oz (91.264 kg)  SpO2:  98% 98% 96%    Intake/Output Summary (Last 24 hours) at 07/23/12 0820 Last data filed at 07/23/12 0350  Gross per 24 hour  Intake    850 ml  Output   2175 ml  Net  -1325 ml   Filed Weights   07/21/12 0434 07/22/12 0500 07/23/12 0350  Weight: 199 lb 4.7 oz (90.4 kg) 203 lb 0.7 oz (92.1 kg) 201 lb 3.2 oz (91.264 kg)    PHYSICAL EXAM  General: Pleasant, NAD. Neuro: Alert and oriented X 3. Moves all extremities spontaneously. Psych:  Normal affect. HEENT:  Normal  Neck: Supple without bruits or JVD. Lungs:  Resp regular and unlabored, CTA. Heart: Distant Abdomen: Soft, non-tender, non-distended, BS + x 4.  Extremities: No clubbing, cyanosis or edema. DP/PT/Radials 2+ and equal bilaterally.  Accessory Clinical Findings  CBC No results found for this basename: WBC:2,NEUTROABS:2,HGB:2,HCT:2,MCV:2,PLT:2 in the last 72 hours Basic Metabolic Panel  Basename 07/23/12 0500 07/22/12 0530  NA 132* 129*  K 3.4* 3.2*  CL 93* 90*  CO2 25 27  GLUCOSE 221* 315*  BUN 42* 44*  CREATININE 1.64* 1.87*  CALCIUM 9.0 9.0  MG -- --  PHOS -- --   Liver Function Tests No results found for this basename: AST:2,ALT:2,ALKPHOS:2,BILITOT:2,PROT:2,ALBUMIN:2 in the last 72 hours No results found for this basename: LIPASE:2,AMYLASE:2 in the last 72 hours Cardiac Enzymes No results found for this basename: CKTOTAL:3,CKMB:3,CKMBINDEX:3,TROPONINI:3 in the last 72 hours BNP No components found with this basename: POCBNP:3 D-Dimer No results found for this basename: DDIMER:2 in the last 72 hours Hemoglobin A1C No results found for this basename: HGBA1C in the last 72 hours Fasting Lipid Panel No results found for this basename: CHOL,HDL,LDLCALC,TRIG,CHOLHDL,LDLDIRECT in the last 72 hours Thyroid  Function Tests No results found for this basename: TSH,T4TOTAL,FREET3,T3FREE,THYROIDAB in the last 72 hours  TELE  V pacing at 85  ECG  NA  Radiology/Studies  Dg Chest Right Decubitus  07/17/2012  *RADIOLOGY REPORT*  Clinical Data: Shortness of breath, cough.  CHEST - RIGHT DECUBITUS  Comparison: 07/17/2012  Findings: Prior CABG.  There is cardiomegaly.  Vascular congestion. Diffuse right lung airspace disease, likely slightly increased since prior study.  Continued right effusion, stable.  IMPRESSION: Diffuse right lung airspace disease, slightly increased since prior study.  Small right effusion.   Original Report Authenticated By: Charlett Nose, M.D.    Dg Chest Port 1 View  07/21/2012  *RADIOLOGY REPORT*  Clinical Data: Follow up vascular congestion  PORTABLE CHEST - 1 VIEW  Comparison: Prior chest x-ray 07/19/2012  Findings: Unchanged position of right upper extremity approach PICC.  The PICC tip terminates at the mid superior vena cava.  Left IJ approach cardiac rhythm maintenance device with leads projecting over the right atrium, right ventricle and within the vein overlying the left ventricle.  Slightly improved pulmonary vascular congestion.  Stable cardiomegaly.  Small right pleural effusion has decreased in size.  There is residual right basilar atelectasis.  IMPRESSION:  1.  Interval resolution of pulmonary vascular congestion. 2.  Decreasing right pleural effusion with some persistent basilar atelectasis. 3.  Stable cardiomegaly.   Original Report Authenticated By: Malachy Moan, M.D.    Dg Chest Port 1 View  07/19/2012  *RADIOLOGY REPORT*  Clinical Data: Respiratory failure, post pacemaker insertion  PORTABLE CHEST - 1 VIEW  Comparison: Portable chest x-ray of 07/18/2012  Findings: The lungs are not well aerated with probable effusions right greater than left and minimal pulmonary vascular congestion. Cardiomegaly is stable.  A dual lead permanent pacemaker is now present and no pneumothorax is seen. Right PICC line is unchanged in position.  IMPRESSION: Permanent pacemaker now present.  No change in poor aeration with effusions and probable pulmonary vascular congestion.   Original Report Authenticated By: Dwyane Dee, M.D.    Dg Chest Port 1 View  07/18/2012  *RADIOLOGY REPORT*  Clinical Data: Respiratory failure  PORTABLE CHEST - 1 VIEW  Comparison: Chest radiograph 07/17/2012  Findings: Right PICC line is unchanged.  Stable enlarged heart silhouette.  There is increased bilateral pleural effusions. Potential bibasilar air space disease.  IMPRESSION: Increased bilateral pleural effusions and cardiomegaly suggest congestive  heart failure.   Original Report Authenticated By: Genevive Bi, M.D.    Dg Chest Port 1 View  07/17/2012  *RADIOLOGY REPORT*  Clinical Data: Shortness of breath, cough.  PORTABLE CHEST - 1 VIEW  Comparison: 07/13/2012  Findings: Prior CABG.  Cardiomegaly with vascular congestion. Right lower lobe airspace opacity is increased.  Probable small right effusion.  IMPRESSION: Increasing right lower lobe atelectasis or infiltrate.  Continue small right effusion.   Original Report Authenticated By: Charlett Nose, M.D.    Dg Chest Port 1 View  07/13/2012  *RADIOLOGY REPORT*  Clinical Data: History of line placement.  PORTABLE CHEST - 1 VIEW  Comparison: 07/12/2012.  Findings: Tip of right brachiocephalic venous catheter terminates in the superior vena cava lower portion.  No pneumothorax is evident.  Left lung is free of infiltrates.  There is hazy opacity in the inferior right lung with element of atelectasis and associated right pleural effusion.  There is stable cardiac silhouette enlargement. The patient has undergone previous median sternotomy and coronary artery bypass grafting.  IMPRESSION: Tip of right brachiocephalic venous catheter  terminates in the superior vena cava lower portion.  No pneumothorax is evident. Stable cardiac silhouette enlargement.  Hazy opacity inferiorly on the right with atelectasis and associated right pleural effusion without significant change.   Original Report Authenticated By: Onalee Hua Call    Dg Chest Port 1 View  07/12/2012  *RADIOLOGY REPORT*  Clinical Data: CHF.  Short of breath.  PORTABLE CHEST - 1 VIEW  Comparison: 05/25/2012  Findings: Heart remains moderately enlarged.  Right pleural effusion and basilar pulmonary parenchymal opacity have developed. Minimal atelectasis at the left base.  Normal vascularity.  No pneumothorax.  IMPRESSION: Cardiomegaly.  Right pleural effusion and basilar consolidation.   Original Report Authenticated By: Jolaine Click, M.D.     ASSESSMENT  AND PLAN  1.  SP PPM for afib RVR following av ablation.  2.  Volume overload  --imiproved.  Diuretic on hold for now with improved BUN/Cr 3.  Hypokalemia --- will replace 4.  Mobility --- needs PT/OT  Currently has a walker in the room.  5.  Anemia  -- recheck CBC in the am.     Signed, Shawnie Pons MD, [facility], [facility]

## 2012-07-23 NOTE — Progress Notes (Signed)
Subjective: 89 AA F with MMP admitted for AFib c RVR and CHF. Breathing well. Working on Heritage manager here on floor. Cr stable. Na/K and electrolytes erratic. S/P AV Node ablation and BiV Pacer CBGs  183-348 No CP or Breathing issues. L shoulder sore.  Legs are fine.  Objective: Vital signs in last 24 hours: Temp:  [98.4 F (36.9 C)-98.6 F (37 C)] 98.6 F (37 C) (01/20 0350) Pulse Rate:  [85-87] 87  (01/20 0350) Resp:  [18-20] 20  (01/20 0350) BP: (110-137)/(53-63) 137/63 mmHg (01/20 0350) SpO2:  [96 %-98 %] 96 % (01/20 0350) Weight:  [91.264 kg (201 lb 3.2 oz)] 91.264 kg (201 lb 3.2 oz) (01/20 0350) Weight change: -0.836 kg (-1 lb 13.5 oz) Last BM Date: 07/22/12  CBG (last 3)   Basename 07/23/12 0626 07/22/12 2129 07/22/12 1647  GLUCAP 183* 348* 249*    Intake/Output from previous day:  Intake/Output Summary (Last 24 hours) at 07/23/12 0702 Last data filed at 07/23/12 0350  Gross per 24 hour  Intake    850 ml  Output   2175 ml  Net  -1325 ml   01/19 0701 - 01/20 0700 In: 850 [P.O.:840; I.V.:10] Out: 2175 [Urine:2175]   Physical Exam  General appearance: comfortable Resp: Distant but clear Cardio: reg/paced controlled. Pacer noted GI: soft, non-tender; bowel sounds normal; no masses,  no organomegaly Extremities: 0 Edema   Lab Results:  Basename 07/23/12 0500 07/22/12 0530  NA 132* 129*  K 3.4* 3.2*  CL 93* 90*  CO2 25 27  GLUCOSE 221* 315*  BUN 42* 44*  CREATININE 1.64* 1.87*  CALCIUM 9.0 9.0  MG -- --  PHOS -- --    No results found for this basename: AST:2,ALT:2,ALKPHOS:2,BILITOT:2,PROT:2,ALBUMIN:2 in the last 72 hours  No results found for this basename: WBC:2,NEUTROABS:2,HGB:2,HCT:2,MCV:2,PLT:2 in the last 72 hours  Lab Results  Component Value Date   INR 1.77* 07/23/2012   INR 1.48 07/22/2012   INR 1.27 07/21/2012    No results found for this basename: CKTOTAL:3,CKMB:3,CKMBINDEX:3,TROPONINI:3 in the last 72 hours  No results found for  this basename: TSH,T4TOTAL,FREET3,T3FREE,THYROIDAB in the last 72 hours  No results found for this basename: VITAMINB12:2,FOLATE:2,FERRITIN:2,TIBC:2,IRON:2,RETICCTPCT:2 in the last 72 hours  Micro Results: No results found for this or any previous visit (from the past 240 hour(s)).   Studies/Results: No results found.   Medications: Scheduled:    . amitriptyline  75 mg Oral QHS  . aspirin EC  81 mg Oral Daily  . atorvastatin  40 mg Oral QPM  . docusate sodium  100 mg Oral BID  . donepezil  5 mg Oral QHS  . insulin aspart  0-15 Units Subcutaneous TID WC  . insulin glargine  35 Units Subcutaneous BID  . levothyroxine  100 mcg Oral QAC breakfast  . metoprolol succinate  50 mg Oral BID  . pantoprazole  80 mg Oral Q1200  . sodium chloride  10-40 mL Intracatheter Q12H  . sodium chloride  3 mL Intravenous Q12H  . Warfarin - Pharmacist Dosing Inpatient   Does not apply q1800   Continuous:     Assessment/Plan: Principal Problem:  *Acute on chronic combined systolic and diastolic congestive heart failure, NYHA class 4 Active Problems:  Hypothyroidism  CAD (coronary artery disease)  Atrial tachycardia  CKD (chronic kidney disease) stage 3, GFR 30-59 ml/min  Diabetes mellitus  Cardiomyopathy-cause unknown ? rate related v ischemic  Acute respiratory failure  Pulmonary edema  Pleural effusion  PAfib - S/p Ablation and  Pacer - doing well. INR 1.77  CHF/Pulm edema - Much better. Edema way down/Cr finally stable.    Azotemia and Elevtrolyte issues. - due to the diuresis but Cr now at baseline.  Diuresis per cards.    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  Continue Lantus/ISS - Increase Lantus 37 BID   Basename 07/23/12 0626 07/22/12 2129 07/22/12 1647  GLUCAP 183* 348* 249*   DVT Prophylaxis/Antticoagulation- Coumadin  Acute on Chronic Anemia - Hbg baseline @ 10 and stable  8.9 on the 17th. She is HD stable.  Slowly progressing towards D/C home.  Await PT/OT recs.   LOS: 12 days   Niels Cranshaw M 07/23/2012, 7:02 AM

## 2012-07-23 NOTE — Progress Notes (Signed)
ANTICOAGULATION CONSULT NOTE - Follow Up Consult  Pharmacy Consult for Coumadin Indication: atrial fibrillation  Allergies  Allergen Reactions  . Betadine (Povidone Iodine) Itching  . Capoten (Captopril) Other (See Comments)    unknown  . Methadone   . Penicillins Swelling  . Propoxyphene And Methadone     Intolerance to darvocet    Patient Measurements: Height: 5\' 3"  (160 cm) Weight: 201 lb 3.2 oz (91.264 kg) (c scale) IBW/kg (Calculated) : 52.4   Vital Signs: Temp: 98.6 F (37 C) (01/20 0350) Temp src: Oral (01/20 0350) BP: 137/63 mmHg (01/20 0350) Pulse Rate: 87  (01/20 0350)  Labs:  Basename 07/23/12 0500 07/22/12 0530 07/21/12 0430  HGB -- -- --  HCT -- -- --  PLT -- -- --  APTT -- -- --  LABPROT 20.0* 17.5* 15.6*  INR 1.77* 1.48 1.27  HEPARINUNFRC -- -- --  CREATININE 1.64* 1.87* 1.95*  CKTOTAL -- -- --  CKMB -- -- --  TROPONINI -- -- --    Estimated Creatinine Clearance: 30.8 ml/min (by C-G formula based on Cr of 1.64).  Assessment: 79yof continues on coumadin for afib. INR remains subtherapeutic but continues to trend up. She has received two higher doses of 10mg . Will try to resume her home dose today.  Home dose: 7.5mg  daily except 5mg  on Tue/Thurs  Goal of Therapy:  INR 2-2.5 Monitor platelets by anticoagulation protocol: Yes   Plan:  1) Coumadin 7.5mg  x 1 2) Follow up INR in AM  Fredrik Rigger 07/23/2012,8:55 AM

## 2012-07-23 NOTE — Progress Notes (Signed)
Pt noted awake at 1am and restless.  Pt attempted to ambulate independently and was noted limping and holding onto furniture. Walker provided and pt able to ambulate 110ft with steady gait. May need walker upon d/c home.

## 2012-07-23 NOTE — Evaluation (Signed)
Occupational Therapy Evaluation and Discharge Patient Details Name: [name] MRN: [id number] DOB: [birth year] Today's Date: 07/23/2012 Time: 9147-8295 OT Time Calculation (min): 21 min  OT Assessment / Plan / Recommendation Clinical Impression  This 79 yo female admitted with CHF exacerbation and uncontrolled a-fib (HR 160s) even on meds. Developed respiratory distress with transfer to ICU and BiPap. 1/16 underwent pacemaker placement and AV node ablation presents to acute OT with all education completed (and her son is an OT that is here from [place] until April). WIll D/C from acute OT services without need for follow up. Handouts on pacemaker given from [facility] education.    OT Assessment  Patient does not need any further OT services    Follow Up Recommendations  No OT follow up       Equipment Recommendations  None recommended by OT          Precautions / Restrictions Precautions Precautions: ICD/Pacemaker Restrictions Weight Bearing Restrictions: No       ADL  Eating/Feeding: Simulated;Independent Where Assessed - Eating/Feeding: Chair Grooming: Simulated;Min guard;Supervision/safety Where Assessed - Grooming: Unsupported standing Upper Body Bathing: Simulated;Set up;Supervision/safety Where Assessed - Upper Body Bathing: Unsupported sitting Lower Body Bathing: Simulated;Min guard Where Assessed - Lower Body Bathing: Unsupported sit to stand Upper Body Dressing: Set up;Supervision/safety;Performed Where Assessed - Upper Body Dressing: Unsupported standing Lower Body Dressing: Simulated;Min guard Where Assessed - Lower Body Dressing: Unsupported sit to stand Toilet Transfer: Simulated;Min guard Toilet Transfer Method: Sit to Barista:  (Recliner, over to door and back to recliner) Toileting - Architect and Hygiene: Simulated;Min guard Where Assessed - Engineer, mining and Hygiene: Standing Equipment Used: Rolling  walker Transfers/Ambulation Related to ADLs: Min guard A for all as well as VCs to not use her LUE for transfers ADL Comments: Educated pt on putting her LUE in first for UBD (she returned demo'd with her house robe)      Visit Information  Last OT Received On: 07/23/12 Assistance Needed: +1    Subjective Data  Subjective: I just have to remember to not use this arm as much (Left due to recent pacemaker)   Prior Functioning     Home Living Lives With: Spouse;Son Available Help at Discharge: Family;Available 24 hours/day Type of Home: House Home Access: Stairs to enter [employer] of Steps: 2 Entrance Stairs-Rails: Right;Left;Can reach both Home Layout: One level Bathroom Shower/Tub: Health visitor: Standard Bathroom Accessibility: Yes How Accessible: Accessible via walker Home Adaptive Equipment: Grab bars around toilet;Straight cane;Walker - rolling;Bedside commode/3-in-1;Built-in shower seat;Grab bars in shower Prior Function Level of Independence: Independent Able to Take Stairs?: Yes Driving: No (not last few months) Vocation: Retired Musician: No difficulties Dominant Hand: Right            Cognition  Overall Cognitive Status: Appears within functional limits for tasks assessed/performed Arousal/Alertness: Awake/alert Orientation Level: Appears intact for tasks assessed Behavior During Session: [facility] for tasks performed    Extremity/Trunk Assessment Right Upper Extremity Assessment RUE ROM/Strength/Tone: Within functional levels Left Upper Extremity Assessment LUE ROM/Strength/Tone: Deficits LUE ROM/Strength/Tone Deficits: Limited use of LUE shoulder movements at this time due to new pacemaker Right Lower Extremity Assessment RLE ROM/Strength/Tone: [facility] for tasks assessed RLE Sensation: History of peripheral neuropathy Left Lower Extremity Assessment LLE ROM/Strength/Tone: WFL for tasks assessed LLE  Sensation: History of peripheral neuropathy Trunk Assessment Trunk Assessment: Normal     Mobility Bed Mobility Bed Mobility: Rolling Left;Left Sidelying to Sit;Sitting - Scoot to  Edge of Bed Rolling Left: 5: Supervision;With rail Left Sidelying to Sit: 5: Supervision;With rails;HOB flat Sitting - Scoot to Edge of Bed: 7: Independent Details for Bed Mobility Assistance: incr effort and time; using RUE on rail Transfers Transfers: Sit to Stand;Stand to Sit Sit to Stand: 4: Min guard;With upper extremity assist;With armrests;From chair/3-in-1 (Use of RUE only) Stand to Sit: 4: Min guard;With upper extremity assist;With armrests;To chair/3-in-1 (with LUE only) Details for Transfer Assistance: VCs for not to use LUE for sit to stand and stand to sit              End of Session OT - End of Session Equipment Utilized During Treatment:  (RW) Activity Tolerance: Patient tolerated treatment well Patient left: in chair Nurse Communication:  (NT and nurse: Pt should not use her LUE for transfers)       Evette Georges [phone] 07/23/2012, 10:20 AM

## 2012-07-23 NOTE — Progress Notes (Signed)
Spoke with patient and family last week regarding [facility] Care Management services when she was on 2900 unit. Consents were signed at bedside. Patient will receive post transition of care call and be evaluated for monthly home visits to assist with patient's disease management. [facility] Care Management will not replace home health services if arranged. Patient mentioned she has had [facility] services in the past. May benefit from home health upon discharge as well. Made inpatient care manager aware [facility] Care Management will follow upon discharge.   Raiford Noble, MSN-Ed, RN,BSN [facility] Liaison [phone]

## 2012-07-23 NOTE — Evaluation (Signed)
Physical Therapy Evaluation Patient Details Name: [name] MRN: [id number] DOB: January 28, 1933 Today's Date: 07/23/2012 Time: 4098-1191 PT Time Calculation (min): 17 min  PT Assessment / Plan / Recommendation Clinical Impression  79 yo adm with CHF exacerbation and uncontrolled a-fib (HR 160s) even on meds. Developed respiratory distress with transfer to ICU and BiPap. 1/16 underwent pacemaker placement and AV node ablation. Currently will benefit from PT to incr safety with mobility prior to d/c home with spouse's assist.    PT Assessment  Patient needs continued PT services    Follow Up Recommendations  Home health PT;Supervision for mobility/OOB        Barriers to Discharge None      Equipment Recommendations  None recommended by PT    Recommendations for Other Services OT consult   Frequency Min 3X/week    Precautions / Restrictions Precautions Precautions: ICD/Pacemaker   Pertinent Vitals/Pain Denied dizziness, no dyspnea; reported Lt shoulder is "tender" (not rated)       Mobility  Bed Mobility Bed Mobility: Rolling Left;Left Sidelying to Sit;Sitting - Scoot to Edge of Bed Rolling Left: 5: Supervision;With rail Left Sidelying to Sit: 5: Supervision;With rails;HOB flat Sitting - Scoot to Edge of Bed: 7: Independent Details for Bed Mobility Assistance: incr effort and time; using RUE on rail Transfers Transfers: Sit to Stand;Stand to Sit Sit to Stand: 4: Min guard;With upper extremity assist;From bed Stand to Sit: 4: Min guard;With upper extremity assist;With armrests;To chair/3-in-1 Details for Transfer Assistance: vc for safe use of RW Ambulation/Gait Ambulation/Gait Assistance: 4: Min guard Ambulation Distance (Feet): 90 Feet Assistive device: Rolling walker Ambulation/Gait Assistance Details: pt tends to push RW too far ahead (RW slightly too tall for her); slight incr lateral sway and wide base of support Gait Pattern: Step-through pattern;Decreased  stride length;Trunk flexed;Wide base of support    Shoulder Instructions  Instructed not to raise elbow higher than her shoulder         PT Diagnosis: Difficulty walking;Generalized weakness  PT Problem List: Decreased balance;Decreased mobility;Decreased knowledge of use of DME;Decreased cognition;Decreased knowledge of precautions;Pain PT Treatment Interventions: DME instruction;Gait training;Stair training;Functional mobility training;Therapeutic activities;Balance training;Patient/family education   PT Goals Acute Rehab PT Goals PT Goal Formulation: With patient Time For Goal Achievement: 07/30/12 Potential to Achieve Goals: Good Pt will go Supine/Side to Sit: with modified independence;with HOB 0 degrees PT Goal: Supine/Side to Sit - Progress: Goal set today Pt will go Sit to Supine/Side: with modified independence;with HOB 0 degrees PT Goal: Sit to Supine/Side - Progress: Goal set today Pt will go Sit to Stand: with upper extremity assist;with supervision PT Goal: Sit to Stand - Progress: Goal set today Pt will go Stand to Sit: with supervision;with upper extremity assist PT Goal: Stand to Sit - Progress: Goal set today Pt will Ambulate: >150 feet;with supervision;with least restrictive assistive device PT Goal: Ambulate - Progress: Goal set today Pt will Go Up / Down Stairs: 1-2 stairs;with min assist;with rail(s) PT Goal: Up/Down Stairs - Progress: Goal set today  Visit Information  Last PT Received On: 07/23/12 Assistance Needed: +1    Subjective Data  Subjective: Pt reports her Lt shoulder is still a bit sore Patient Stated Goal: get back to walking without walker   Prior Functioning  Home Living Lives With: Spouse Available Help at Discharge: Family;Available 24 hours/day Type of Home: House Home Access: Stairs to enter [employer] of Steps: 2 Entrance Stairs-Rails: Right;Left;Can reach both Home Layout: One level Bathroom Shower/Tub: Walk-in  shower Bathroom Toilet: Standard Bathroom Accessibility: Yes How Accessible: Accessible via walker Home Adaptive Equipment: Grab bars around toilet;Straight cane;Walker - rolling;Bedside commode/3-in-1;Built-in shower seat;Grab bars in shower Prior Function Level of Independence: Independent Able to Take Stairs?: Yes Driving: No (not inthe last few months) Communication Communication: No difficulties Dominant Hand: Right    Cognition  Overall Cognitive Status: Appears within functional limits for tasks assessed/performed Arousal/Alertness: Awake/alert Orientation Level: Time Behavior During Session: [facility] for tasks performed    Extremity/Trunk Assessment Right Lower Extremity Assessment RLE ROM/Strength/Tone: [facility] for tasks assessed RLE Sensation: History of peripheral neuropathy Left Lower Extremity Assessment LLE ROM/Strength/Tone: WFL for tasks assessed LLE Sensation: History of peripheral neuropathy Trunk Assessment Trunk Assessment: Normal   Balance    End of Session PT - End of Session Activity Tolerance: Patient tolerated treatment well;Other (comment) (pulse ox not working due to artificial nails; on telemetry) Patient left: in chair;with call bell/phone within reach;with nursing in room Nurse Communication: Mobility status  GP     [name] 07/23/2012, 9:09 AM Pager [phone]

## 2012-07-24 ENCOUNTER — Inpatient Hospital Stay (HOSPITAL_COMMUNITY): Payer: Medicare Other

## 2012-07-24 DIAGNOSIS — Z7901 Long term (current) use of anticoagulants: Secondary | ICD-10-CM

## 2012-07-24 LAB — GLUCOSE, CAPILLARY
Glucose-Capillary: 310 mg/dL — ABNORMAL HIGH (ref 70–99)
Glucose-Capillary: 211 mg/dL — ABNORMAL HIGH (ref 70–99)
Glucose-Capillary: 324 mg/dL — ABNORMAL HIGH (ref 70–99)

## 2012-07-24 LAB — BASIC METABOLIC PANEL
BUN: 39 mg/dL — ABNORMAL HIGH (ref 6–23)
Creatinine, Ser: 1.56 mg/dL — ABNORMAL HIGH (ref 0.50–1.10)
GFR calc Af Amer: 36 mL/min — ABNORMAL LOW (ref >90)
GFR calc non Af Amer: 31 mL/min — ABNORMAL LOW (ref >90)
Glucose, Bld: 262 mg/dL — ABNORMAL HIGH (ref 70–99)
Potassium: 3.6 mEq/L (ref 3.5–5.1)

## 2012-07-24 LAB — CBC
HCT: 30.1 % — ABNORMAL LOW (ref 36.0–46.0)
MCH: 29.7 pg (ref 26.0–34.0)
MCV: 89.3 fL (ref 78.0–100.0)
RDW: 15.4 % (ref 11.5–15.5)
WBC: 8.8 10*3/uL (ref 4.0–10.5)

## 2012-07-24 MED ORDER — WARFARIN SODIUM 5 MG PO TABS
5.0000 mg | ORAL_TABLET | Freq: Once | ORAL | Status: AC
  Administered 2012-07-24: 5 mg via ORAL
  Filled 2012-07-24: qty 1

## 2012-07-24 MED ORDER — INSULIN ASPART 100 UNIT/ML ~~LOC~~ SOLN
0.0000 [IU] | Freq: Every day | SUBCUTANEOUS | Status: DC
  Administered 2012-07-24 (×2): 4 [IU] via SUBCUTANEOUS

## 2012-07-24 NOTE — Progress Notes (Signed)
Inpatient Diabetes Program Recommendations  AACE/ADA: New Consensus Statement on Inpatient Glycemic Control (2013)  Target Ranges:  Prepandial:   less than 140 mg/dL      Peak postprandial:   less than 180 mg/dL (1-2 hours)      Critically ill patients:  140 - 180 mg/dL   Results for ELLAMAE, LYBECK (MRN [id number]) as of 07/24/2012 14:10  Ref. Range 07/23/2012 06:26 07/23/2012 11:09 07/23/2012 16:42 07/23/2012 21:38 07/24/2012 01:44 07/24/2012 06:04 07/24/2012 11:35  Glucose-Capillary Latest Range: 70-99 mg/dL 409 (H) 811 (H) 914 (H) 421 (H) 310 (H) 238 (H) 211 (H)    Inpatient Diabetes Program Recommendations Insulin - Basal: Please consider increasing Lantus to 38 units BID. Correction (SSI): Please consider increasing Novolog correction to resistant scale. Insulin - Meal Coverage: Please consider adding meal coverage.  Recommend Novolog 3 units TID with meals as long patient eats at least 50% of meal. HgbA1C: Please consider ordering an A1C since the last A1C in the chart was 10.6% on 05/26/2012.  Note: Patient's blood glucose has ranged from 183-421 mg/dl over the past 32 hours.  Fasting lab blood glucose this morning at 5am was 262 mg/dl.  Please consider increasing Lantus to 38 units BID, increasing Novolog correction to resistant scale, adding meal coverage Novolog 3 units TID with meals, and ordering an A1C to determine glycemic control over the last 2-3 months.  Will continue to follow.  Thanks, Orlando Penner, RN, BSN, CCRN Diabetes Coordinator Inpatient Diabetes Program [phone]

## 2012-07-24 NOTE — Progress Notes (Signed)
Patient Name: [name] Date of Encounter: 07/24/2012     Principal Problem:  *Acute on chronic combined systolic and diastolic congestive heart failure, NYHA class 4 Active Problems:  Hypothyroidism  CAD (coronary artery disease)  Atrial tachycardia  CKD (chronic kidney disease) stage 3, GFR 30-59 ml/min  Diabetes mellitus  Cardiomyopathy-cause unknown ? rate related v ischemic  Acute respiratory failure  Pulmonary edema  Pleural effusion    SUBJECTIVE  Slight cough but overall feels much better.  Rhythm is paced.  Glucoses managed by Dr. Timothy Lasso.  I/O are negative  CURRENT MEDS    . amitriptyline  75 mg Oral QHS  . aspirin EC  81 mg Oral Daily  . atorvastatin  40 mg Oral QPM  . docusate sodium  100 mg Oral BID  . donepezil  5 mg Oral QHS  . insulin aspart  0-15 Units Subcutaneous TID WC  . insulin aspart  0-5 Units Subcutaneous QHS  . insulin glargine  37 Units Subcutaneous BID  . levothyroxine  100 mcg Oral QAC breakfast  . metoprolol succinate  50 mg Oral BID  . pantoprazole  80 mg Oral Q1200  . sodium chloride  10-40 mL Intracatheter Q12H  . sodium chloride  3 mL Intravenous Q12H  . warfarin  5 mg Oral ONCE-1800  . Warfarin - Pharmacist Dosing Inpatient   Does not apply q1800    OBJECTIVE  Filed Vitals:   07/23/12 0944 07/23/12 1512 07/23/12 2059 07/24/12 0436  BP: 99/73 105/55 125/55 129/56  Pulse: 73 73 83 86  Temp: 98.1 F (36.7 C) 98.2 F (36.8 C) 98.1 F (36.7 C) 98.3 F (36.8 C)  TempSrc: Oral Oral Oral Oral  Resp:  20 20 20   Height:      Weight:    202 lb 9.6 oz (91.9 kg)  SpO2: 98% 96% 100% 98%    Intake/Output Summary (Last 24 hours) at 07/24/12 0945 Last data filed at 07/24/12 0501  Gross per 24 hour  Intake    535 ml  Output   1675 ml  Net  -1140 ml   Filed Weights   07/22/12 0500 07/23/12 0350 07/24/12 0436  Weight: 203 lb 0.7 oz (92.1 kg) 201 lb 3.2 oz (91.264 kg) 202 lb 9.6 oz (91.9 kg)    PHYSICAL EXAM  General:  Pleasant, NAD. Neuro: Alert and oriented X 3. Moves all extremities spontaneously. Psych: Normal affect. HEENT:  Normal  Neck: Supple without bruits or JVD.  Pacer site clean and well healed.   Lungs:  Resp regular and unlabored, slight decrease BS R base Heart: distant but regular.  Abdomen: Soft, non-tender, non-distended, BS + x 4.  Extremities: No clubbing, cyanosis or edema. DP/PT/Radials 2+ and equal bilaterally.  Accessory Clinical Findings  CBC  Basename 07/24/12 0545 07/23/12 0920  WBC 8.8 9.6  NEUTROABS -- --  HGB 10.0* 11.1*  HCT 30.1* 32.5*  MCV 89.3 89.0  PLT 249 261   Basic Metabolic Panel  Basename 07/24/12 0545 07/23/12 2237 07/23/12 0500  NA 132* -- 132*  K 3.6 -- 3.4*  CL 95* -- 93*  CO2 26 -- 25  GLUCOSE 262* 474* --  BUN 39* -- 42*  CREATININE 1.56* -- 1.64*  CALCIUM 8.9 -- 9.0  MG -- -- --  PHOS -- -- --   Liver Function Tests No results found for this basename: AST:2,ALT:2,ALKPHOS:2,BILITOT:2,PROT:2,ALBUMIN:2 in the last 72 hours No results found for this basename: LIPASE:2,AMYLASE:2 in the last 72 hours Cardiac  Enzymes No results found for this basename: CKTOTAL:3,CKMB:3,CKMBINDEX:3,TROPONINI:3 in the last 72 hours BNP No components found with this basename: POCBNP:3 D-Dimer No results found for this basename: DDIMER:2 in the last 72 hours Hemoglobin A1C No results found for this basename: HGBA1C in the last 72 hours Fasting Lipid Panel No results found for this basename: CHOL,HDL,LDLCALC,TRIG,CHOLHDL,LDLDIRECT in the last 72 hours Thyroid Function Tests No results found for this basename: TSH,T4TOTAL,FREET3,T3FREE,THYROIDAB in the last 72 hours  TELE  Paced rhythm  ECG  NA  Radiology/Studies  Dg Chest 2 View  07/24/2012  *RADIOLOGY REPORT*  Clinical Data: Shortness of breath and congestive heart failure.  CHEST - 2 VIEW  Comparison: 07/21/2012  Findings: Right-sided PICC line tip:  SVC.  Pacer noted with lead tips projecting over  the right atrium and ventricle, and with a suspected coronary sinus pacer in the great cardiac vein.  Low lung volumes are present, causing crowding of the pulmonary vasculature.  Moderately enlarged cardiopericardial silhouette noted.  The patient has had prior CABG.  Continued indistinctness of the right hemidiaphragm noted with mild right lower lobe airspace opacity and possibly a right pleural effusion.  No overt pulmonary edema.  IMPRESSION:  1.  Right lower lobe airspace opacity is nonspecific, but likely a combination of atelectasis and pleural effusion. 2.  Cardiomegaly.   Original Report Authenticated By: Gaylyn Rong, M.D.    Dg Chest Right Decubitus  07/17/2012  *RADIOLOGY REPORT*  Clinical Data: Shortness of breath, cough.  CHEST - RIGHT DECUBITUS  Comparison: 07/17/2012  Findings: Prior CABG.  There is cardiomegaly.  Vascular congestion. Diffuse right lung airspace disease, likely slightly increased since prior study.  Continued right effusion, stable.  IMPRESSION: Diffuse right lung airspace disease, slightly increased since prior study.  Small right effusion.   Original Report Authenticated By: Charlett Nose, M.D.    Dg Chest Port 1 View  07/21/2012  *RADIOLOGY REPORT*  Clinical Data: Follow up vascular congestion  PORTABLE CHEST - 1 VIEW  Comparison: Prior chest x-ray 07/19/2012  Findings: Unchanged position of right upper extremity approach PICC.  The PICC tip terminates at the mid superior vena cava.  Left IJ approach cardiac rhythm maintenance device with leads projecting over the right atrium, right ventricle and within the vein overlying the left ventricle.  Slightly improved pulmonary vascular congestion.  Stable cardiomegaly.  Small right pleural effusion has decreased in size.  There is residual right basilar atelectasis.  IMPRESSION:  1.  Interval resolution of pulmonary vascular congestion. 2.  Decreasing right pleural effusion with some persistent basilar atelectasis. 3.  Stable  cardiomegaly.   Original Report Authenticated By: Malachy Moan, M.D.    Dg Chest Port 1 View  07/19/2012  *RADIOLOGY REPORT*  Clinical Data: Respiratory failure, post pacemaker insertion  PORTABLE CHEST - 1 VIEW  Comparison: Portable chest x-ray of 07/18/2012  Findings: The lungs are not well aerated with probable effusions right greater than left and minimal pulmonary vascular congestion. Cardiomegaly is stable.  A dual lead permanent pacemaker is now present and no pneumothorax is seen. Right PICC line is unchanged in position.  IMPRESSION: Permanent pacemaker now present.  No change in poor aeration with effusions and probable pulmonary vascular congestion.   Original Report Authenticated By: Dwyane Dee, M.D.    Dg Chest Port 1 View  07/18/2012  *RADIOLOGY REPORT*  Clinical Data: Respiratory failure  PORTABLE CHEST - 1 VIEW  Comparison: Chest radiograph 07/17/2012  Findings: Right PICC line is unchanged.  Stable enlarged heart silhouette.  There is increased bilateral pleural effusions. Potential bibasilar air space disease.  IMPRESSION: Increased bilateral pleural effusions and cardiomegaly suggest congestive heart failure.   Original Report Authenticated By: Genevive Bi, M.D.    Dg Chest Port 1 View  07/17/2012  *RADIOLOGY REPORT*  Clinical Data: Shortness of breath, cough.  PORTABLE CHEST - 1 VIEW  Comparison: 07/13/2012  Findings: Prior CABG.  Cardiomegaly with vascular congestion. Right lower lobe airspace opacity is increased.  Probable small right effusion.  IMPRESSION: Increasing right lower lobe atelectasis or infiltrate.  Continue small right effusion.   Original Report Authenticated By: Charlett Nose, M.D.    Dg Chest Port 1 View  07/13/2012  *RADIOLOGY REPORT*  Clinical Data: History of line placement.  PORTABLE CHEST - 1 VIEW  Comparison: 07/12/2012.  Findings: Tip of right brachiocephalic venous catheter terminates in the superior vena cava lower portion.  No pneumothorax is  evident.  Left lung is free of infiltrates.  There is hazy opacity in the inferior right lung with element of atelectasis and associated right pleural effusion.  There is stable cardiac silhouette enlargement. The patient has undergone previous median sternotomy and coronary artery bypass grafting.  IMPRESSION: Tip of right brachiocephalic venous catheter terminates in the superior vena cava lower portion.  No pneumothorax is evident. Stable cardiac silhouette enlargement.  Hazy opacity inferiorly on the right with atelectasis and associated right pleural effusion without significant change.   Original Report Authenticated By: Onalee Hua Call    Dg Chest Port 1 View  07/12/2012  *RADIOLOGY REPORT*  Clinical Data: CHF.  Short of breath.  PORTABLE CHEST - 1 VIEW  Comparison: 05/25/2012  Findings: Heart remains moderately enlarged.  Right pleural effusion and basilar pulmonary parenchymal opacity have developed. Minimal atelectasis at the left base.  Normal vascularity.  No pneumothorax.  IMPRESSION: Cardiomegaly.  Right pleural effusion and basilar consolidation.   Original Report Authenticated By: Jolaine Click, M.D.     ASSESSMENT AND PLAN   1.  SP pacer for ablation 2. DM 3.  CKD  Improving 4.  RLL atelectasis.  Ambulate today.  Recheck CXR am.  Home tomorrow if stable.    Signed, Shawnie Pons MD, [facility], FSCAI

## 2012-07-24 NOTE — Progress Notes (Signed)
ANTICOAGULATION CONSULT NOTE - Follow Up Consult  Pharmacy Consult for Coumadin Indication: atrial fibrillation  Allergies  Allergen Reactions  . Betadine (Povidone Iodine) Itching  . Capoten (Captopril) Other (See Comments)    unknown  . Methadone   . Penicillins Swelling  . Propoxyphene And Methadone     Intolerance to darvocet    Patient Measurements: Height: 5\' 3"  (160 cm) Weight: 202 lb 9.6 oz (91.9 kg) IBW/kg (Calculated) : 52.4   Vital Signs: Temp: 98.3 F (36.8 C) (01/21 0436) Temp src: Oral (01/21 0436) BP: 129/56 mmHg (01/21 0436) Pulse Rate: 86  (01/21 0436)  Labs:  Basename 07/24/12 0545 07/23/12 0920 07/23/12 0500 07/22/12 0530  HGB 10.0* 11.1* -- --  HCT 30.1* 32.5* -- --  PLT 249 261 -- --  APTT -- -- -- --  LABPROT 22.5* -- 20.0* 17.5*  INR 2.08* -- 1.77* 1.48  HEPARINUNFRC -- -- -- --  CREATININE 1.56* -- 1.64* 1.87*  CKTOTAL -- -- -- --  CKMB -- -- -- --  TROPONINI -- -- -- --    Estimated Creatinine Clearance: 32.5 ml/min (by C-G formula based on Cr of 1.56).  Assessment: [name] continues on coumadin for afib. INR is now therapeutic. CBC is stable. No bleeding reported. Will continue with her home regimen for now.  Home dose: 7.5mg  daily except 5mg  on Tue/Thurs  Goal of Therapy:  INR 2-2.5 Monitor platelets by anticoagulation protocol: Yes   Plan:  1) Coumadin 5mg  x 1 2) Follow up INR in AM  Fredrik Rigger 07/24/2012,8:47 AM

## 2012-07-25 ENCOUNTER — Telehealth: Payer: Self-pay | Admitting: Oncology

## 2012-07-25 ENCOUNTER — Encounter: Payer: Self-pay | Admitting: *Deleted

## 2012-07-25 ENCOUNTER — Inpatient Hospital Stay (HOSPITAL_COMMUNITY): Payer: Medicare Other

## 2012-07-25 LAB — GLUCOSE, CAPILLARY
Glucose-Capillary: 209 mg/dL — ABNORMAL HIGH (ref 70–99)
Glucose-Capillary: 194 mg/dL — ABNORMAL HIGH (ref 70–99)

## 2012-07-25 LAB — BASIC METABOLIC PANEL
BUN: 38 mg/dL — ABNORMAL HIGH (ref 6–23)
Creatinine, Ser: 1.51 mg/dL — ABNORMAL HIGH (ref 0.50–1.10)
GFR calc Af Amer: 37 mL/min — ABNORMAL LOW (ref >90)
GFR calc non Af Amer: 32 mL/min — ABNORMAL LOW (ref >90)
Glucose, Bld: 221 mg/dL — ABNORMAL HIGH (ref 70–99)
Potassium: 3.6 mEq/L (ref 3.5–5.1)

## 2012-07-25 LAB — CBC
HCT: 30 % — ABNORMAL LOW (ref 36.0–46.0)
MCH: 29 pg (ref 26.0–34.0)
MCV: 89.6 fL (ref 78.0–100.0)
Platelets: 240 10*3/uL (ref 150–400)
RBC: 3.35 MIL/uL — ABNORMAL LOW (ref 3.87–5.11)
RDW: 15.4 % (ref 11.5–15.5)
WBC: 8.3 10*3/uL (ref 4.0–10.5)

## 2012-07-25 MED ORDER — INSULIN ASPART 100 UNIT/ML ~~LOC~~ SOLN
0.0000 [IU] | Freq: Three times a day (TID) | SUBCUTANEOUS | Status: DC
  Administered 2012-07-25: 7 [IU] via SUBCUTANEOUS
  Administered 2012-07-26 (×3): 3 [IU] via SUBCUTANEOUS

## 2012-07-25 MED ORDER — INSULIN ASPART 100 UNIT/ML ~~LOC~~ SOLN
0.0000 [IU] | Freq: Every day | SUBCUTANEOUS | Status: DC
  Administered 2012-07-25: 2 [IU] via SUBCUTANEOUS

## 2012-07-25 MED ORDER — INSULIN GLARGINE 100 UNIT/ML ~~LOC~~ SOLN
39.0000 [IU] | Freq: Two times a day (BID) | SUBCUTANEOUS | Status: DC
  Administered 2012-07-25 – 2012-07-26 (×2): 39 [IU] via SUBCUTANEOUS

## 2012-07-25 MED ORDER — WARFARIN SODIUM 5 MG PO TABS
5.0000 mg | ORAL_TABLET | Freq: Once | ORAL | Status: AC
  Administered 2012-07-25: 5 mg via ORAL
  Filled 2012-07-25: qty 1

## 2012-07-25 MED ORDER — INSULIN ASPART 100 UNIT/ML ~~LOC~~ SOLN
3.0000 [IU] | Freq: Three times a day (TID) | SUBCUTANEOUS | Status: DC
  Administered 2012-07-25 – 2012-07-26 (×4): 3 [IU] via SUBCUTANEOUS

## 2012-07-25 MED ORDER — FUROSEMIDE 40 MG PO TABS
40.0000 mg | ORAL_TABLET | Freq: Every day | ORAL | Status: DC
  Administered 2012-07-26: 40 mg via ORAL
  Filled 2012-07-25: qty 1

## 2012-07-25 NOTE — Progress Notes (Signed)
ANTICOAGULATION CONSULT NOTE - Follow Up Consult  Pharmacy Consult for Coumadin Indication: atrial fibrillation  Allergies  Allergen Reactions  . Betadine (Povidone Iodine) Itching  . Capoten (Captopril) Other (See Comments)    unknown  . Codone (Hydrocodone)   . Methadone   . Penicillins Swelling  . Propoxyphene And Methadone     Intolerance to darvocet    Patient Measurements: Height: 5\' 3"  (160 cm) Weight: 203 lb 4.2 oz (92.2 kg) IBW/kg (Calculated) : 52.4   Vital Signs: Temp: 98.2 F (36.8 C) (01/22 0606) Temp src: Oral (01/22 0606) BP: 109/51 mmHg (01/22 0606) Pulse Rate: 82  (01/22 0606)  Labs:  Basename 07/25/12 0500 07/24/12 0545 07/23/12 0920 07/23/12 0500  HGB 9.7* 10.0* -- --  HCT 30.0* 30.1* 32.5* --  PLT 240 249 261 --  APTT -- -- -- --  LABPROT 24.1* 22.5* -- 20.0*  INR 2.28* 2.08* -- 1.77*  HEPARINUNFRC -- -- -- --  CREATININE 1.51* 1.56* -- 1.64*  CKTOTAL -- -- -- --  CKMB -- -- -- --  TROPONINI -- -- -- --    Estimated Creatinine Clearance: 33.6 ml/min (by C-G formula based on Cr of 1.51).  Assessment: [name] continues on coumadin for afib. INR is therapeutic. CBC is stable. No bleeding reported. Eating 100% of meals.   Home dose: 7.5mg  daily except 5mg  on Tue/Thurs  Goal of Therapy:  INR 2-2.5 Monitor platelets by anticoagulation protocol: Yes   Plan:  1) Coumadin 5mg  x 1 2) Follow up INR in AM if not d/c home   Thank you,  Brett Fairy, PharmD, BCPS 07/25/2012 10:08 AM

## 2012-07-25 NOTE — Progress Notes (Signed)
I have been following her CBGs and updated her Insulin orders to be close to what Diabetes coordinator suggested.

## 2012-07-25 NOTE — Progress Notes (Signed)
Patient: [name] Date of Encounter: 07/25/2012, 2:10 PM Admit date: 07/11/2012     Subjective  Called to see [name] for evaluation of her BiV PPM site. She reports mild incisional soreness. Her INR is now therapeutic at 2.28.   Objective  Physical Exam: Vitals: BP 115/71  Pulse 100  Temp 98.2 F (36.8 C) (Oral)  Resp 16  Ht 5\' 3"  (1.6 m)  Wt 203 lb 4.2 oz (92.2 kg)  BMI 36.01 kg/m2  SpO2 96% General: Well developed 79 year old female in no acute distress. Neck: Supple. JVD not elevated. Lungs: Clear bilaterally to auscultation without wheezes, rales, or rhonchi. Breathing is unlabored. Heart: RRR S1 S2 without murmurs, rubs, or gallops.  Abdomen: Soft, non-distended. Extremities: No clubbing or cyanosis. No edema.  Distal pedal pulses are 2+ and equal bilaterally. Neuro: Alert and oriented X 3. Moves all extremities spontaneously. No focal deficits. Skin: Left upper chest/implant site intact without significant bleeding or hematoma.   Intake/Output:  Intake/Output Summary (Last 24 hours) at 07/25/12 1410 Last data filed at 07/25/12 1058  Gross per 24 hour  Intake   1020 ml  Output    200 ml  Net    820 ml    Inpatient Medications:     . amitriptyline  75 mg Oral QHS  . aspirin EC  81 mg Oral Daily  . atorvastatin  40 mg Oral QPM  . docusate sodium  100 mg Oral BID  . donepezil  5 mg Oral QHS  . furosemide  40 mg Oral Daily  . insulin aspart  0-20 Units Subcutaneous TID WC  . insulin aspart  0-5 Units Subcutaneous QHS  . insulin aspart  3 Units Subcutaneous TID WC  . insulin glargine  39 Units Subcutaneous BID  . levothyroxine  100 mcg Oral QAC breakfast  . metoprolol succinate  50 mg Oral BID  . pantoprazole  80 mg Oral Q1200  . sodium chloride  10-40 mL Intracatheter Q12H  . warfarin  5 mg Oral ONCE-1800  . Warfarin - Pharmacist Dosing Inpatient   Does not apply q1800    Labs:  [facility] 07/25/12 0500 07/24/12 0545  NA 134* 132*  K 3.6  3.6  CL 98 95*  CO2 27 26  GLUCOSE 221* 262*  BUN 38* 39*  CREATININE 1.51* 1.56*  CALCIUM 9.0 8.9  MG -- --  PHOS -- --    Basename 07/25/12 0500 07/24/12 0545  WBC 8.3 8.8  NEUTROABS -- --  HGB 9.7* 10.0*  HCT 30.0* 30.1*  MCV 89.6 89.3  PLT 240 249    Basename 07/25/12 0500  INR 2.28*    Radiology/Studies: Dg Chest 2 View  07/25/2012  *RADIOLOGY REPORT*  Clinical Data: Chest pain, recent pacer placement  CHEST - 2 VIEW  Comparison: 07/24/2012  Findings: Moderate cardiac silhouette enlargement stable.  No change in position of pacer leads or pulse generator.  No change in position of right PICC line. Mild left lower lobe atelectasis. Persistent small right effusion with underlying airspace process.  IMPRESSION: No significant change from comparison study.   Original Report Authenticated By: Esperanza Heir, M.D.    Dg Chest 2 View  07/24/2012  *RADIOLOGY REPORT*  Clinical Data: Shortness of breath and congestive heart failure.  CHEST - 2 VIEW  Comparison: 07/21/2012  Findings: Right-sided PICC line tip:  SVC.  Pacer noted with lead tips projecting over the right atrium and ventricle, and with a suspected coronary  sinus pacer in the great cardiac vein.  Low lung volumes are present, causing crowding of the pulmonary vasculature.  Moderately enlarged cardiopericardial silhouette noted.  The patient has had prior CABG.  Continued indistinctness of the right hemidiaphragm noted with mild right lower lobe airspace opacity and possibly a right pleural effusion.  No overt pulmonary edema.  IMPRESSION:  1.  Right lower lobe airspace opacity is nonspecific, but likely a combination of atelectasis and pleural effusion. 2.  Cardiomegaly.   Original Report Authenticated By: Gaylyn Rong, M.D.      Assessment and Plan  1. Atrial tachycardia - Dr. Ladona Ridgel paced her back to SR via her device on 07/20/2012 2. s/p AV node ablation, BiV PPM - implant site intact without significant bleeding or  hematoma 3. Acute on chronic combined HF 4. Atrial fibrillation 5. CKD 6. CAD No new EP recommendations at this time. Dr. Graciela Husbands to see.  Signed, Ezekiel Menzer PA-C

## 2012-07-25 NOTE — Progress Notes (Signed)
Physical Therapy Treatment Patient Details Name: LASHEKA KEMPNER MRN: [id number] DOB: 1932-07-20 Today's Date: 07/25/2012 Time: 2952-8413 PT Time Calculation (min): 17 min  PT Assessment / Plan / Recommendation Comments on Treatment Session    79 yo adm with CHF exacerbation and uncontrolled a-fib (HR 160s) even on meds. Developed respiratory distress with transfer to ICU and BiPap. 1/16 underwent pacemaker placement and AV node ablation. Progressing well with therapy. Continues to need RW and education with regards to using this at home with supervision from husband  was provided.    Follow Up Recommendations  Home health PT;Supervision for mobility/OOB     Does the patient have the potential to tolerate intense rehabilitation     Barriers to Discharge        Equipment Recommendations  None recommended by PT    Recommendations for Other Services    Frequency Min 3X/week   Plan Discharge plan remains appropriate;Frequency remains appropriate    Precautions / Restrictions Precautions Precautions: Fall Restrictions Weight Bearing Restrictions: No       Mobility  Bed Mobility Bed Mobility: Not assessed Transfers Transfers: Sit to Stand;Stand to Sit Sit to Stand: 5: Supervision;With upper extremity assist;From chair/3-in-1 Stand to Sit: 5: Supervision;With upper extremity assist;To chair/3-in-1 Details for Transfer Assistance: verbal cues for safe technique and hand placement Ambulation/Gait Ambulation/Gait Assistance: 5: Supervision;4: Min guard Ambulation Distance (Feet): 200 Feet Assistive device: Rolling walker Ambulation/Gait Assistance Details: cues for tall posture and safe positioning within RW Gait Pattern: Step-through pattern;Trunk flexed Stairs: Yes Stairs Assistance: 4: Min guard Stairs Assistance Details (indicate cue type and reason): cues for safe technique Stair Management Technique: One rail Right Number of Stairs: 2      PT Goals Acute Rehab PT  Goals Pt will go Sit to Stand: with modified independence;with upper extremity assist PT Goal: Sit to Stand - Progress: Updated due to goal met Pt will go Stand to Sit: with modified independence;with upper extremity assist PT Goal: Stand to Sit - Progress: Updated due to goals met Pt will Ambulate: with modified independence;with least restrictive assistive device PT Goal: Ambulate - Progress: Updated due to goal met Pt will Go Up / Down Stairs: 1-2 stairs;with min assist;with rail(s) PT Goal: Up/Down Stairs - Progress: Met  Visit Information  Last PT Received On: 07/25/12 Assistance Needed: +1    Subjective Data  Subjective: I feel pretty good!   Cognition  Overall Cognitive Status: Appears within functional limits for tasks assessed/performed Arousal/Alertness: Awake/alert Orientation Level: Appears intact for tasks assessed Behavior During Session: [facility] for tasks performed    Balance     End of Session PT - End of Session Equipment Utilized During Treatment: Gait belt Activity Tolerance: Patient tolerated treatment well Patient left: in chair;with call bell/phone within reach Nurse Communication: Mobility status   GP     [facility] HELEN 07/25/2012, 12:50 PM

## 2012-07-25 NOTE — Telephone Encounter (Signed)
Moved 2/14 appt to 2/13 poof. lmonvm for pt and mailed schedule.

## 2012-07-25 NOTE — Progress Notes (Signed)
Utilization Review Completed.   Jesicca Dipierro, RN, BSN Nurse Case Manager  [phone]  

## 2012-07-25 NOTE — Progress Notes (Signed)
Patient Name: [name] Date of Encounter: 07/25/2012     Principal Problem:  *Acute on chronic combined systolic and diastolic congestive heart failure, NYHA class 4 Active Problems:  Hypothyroidism  CAD (coronary artery disease)  Atrial tachycardia  CKD (chronic kidney disease) stage 3, GFR 30-59 ml/min  Diabetes mellitus  Cardiomyopathy-cause unknown ? rate related v ischemic  Acute respiratory failure  Pulmonary edema  Pleural effusion    SUBJECTIVE  Appreciate note of Dr. Timothy Lasso.  I called his office earlier, and he has seen patient with adjustment of insulin according to diabetic coordinators recommendations.  She feels good, and is markedly improved.  Pacer site is a little tender.  INR is now therapeutic.  CURRENT MEDS    . amitriptyline  75 mg Oral QHS  . aspirin EC  81 mg Oral Daily  . atorvastatin  40 mg Oral QPM  . docusate sodium  100 mg Oral BID  . donepezil  5 mg Oral QHS  . insulin aspart  0-20 Units Subcutaneous TID WC  . insulin aspart  0-5 Units Subcutaneous QHS  . insulin aspart  3 Units Subcutaneous TID WC  . insulin glargine  39 Units Subcutaneous BID  . levothyroxine  100 mcg Oral QAC breakfast  . metoprolol succinate  50 mg Oral BID  . pantoprazole  80 mg Oral Q1200  . sodium chloride  10-40 mL Intracatheter Q12H  . sodium chloride  3 mL Intravenous Q12H  . warfarin  5 mg Oral ONCE-1800  . Warfarin - Pharmacist Dosing Inpatient   Does not apply q1800    OBJECTIVE  Filed Vitals:   07/25/12 0606 07/25/12 1057 07/25/12 1114 07/25/12 1131  BP: 109/51 115/71    Pulse: 82 80 80 100  Temp: 98.2 F (36.8 C)     TempSrc: Oral     Resp: 20 16    Height:      Weight: 203 lb 4.2 oz (92.2 kg)     SpO2: 100% 96%      Intake/Output Summary (Last 24 hours) at 07/25/12 1348 Last data filed at 07/25/12 1058  Gross per 24 hour  Intake   1020 ml  Output    200 ml  Net    820 ml   Filed Weights   07/23/12 0350 07/24/12 0436 07/25/12 0606   Weight: 201 lb 3.2 oz (91.264 kg) 202 lb 9.6 oz (91.9 kg) 203 lb 4.2 oz (92.2 kg)    PHYSICAL EXAM  General: Pleasant, NAD. Neuro: Alert and oriented X 3. Moves all extremities spontaneously. Psych: Normal affect. HEENT:  Normal  Neck: Supple without bruits or JVD.  Pacer site is minimally puffy without erythema.  Modestly tender to touch.   Lungs:  Resp regular and unlabored, CTA. Heart: RRR no s3, s4, or murmurs. Abdomen: Soft, non-tender, non-distended, BS + x 4.  Extremities: No clubbing, cyanosis or edema. DP/PT/Radials 2+ and equal bilaterally.  Accessory Clinical Findings  CBC  Basename 07/25/12 0500 07/24/12 0545  WBC 8.3 8.8  NEUTROABS -- --  HGB 9.7* 10.0*  HCT 30.0* 30.1*  MCV 89.6 89.3  PLT 240 249   Basic Metabolic Panel  Basename 07/25/12 0500 07/24/12 0545  NA 134* 132*  K 3.6 3.6  CL 98 95*  CO2 27 26  GLUCOSE 221* 262*  BUN 38* 39*  CREATININE 1.51* 1.56*  CALCIUM 9.0 8.9  MG -- --  PHOS -- --   Liver Function Tests No results found for this basename:  AST:2,ALT:2,ALKPHOS:2,BILITOT:2,PROT:2,ALBUMIN:2 in the last 72 hours No results found for this basename: LIPASE:2,AMYLASE:2 in the last 72 hours Cardiac Enzymes No results found for this basename: CKTOTAL:3,CKMB:3,CKMBINDEX:3,TROPONINI:3 in the last 72 hours BNP No components found with this basename: POCBNP:3 D-Dimer No results found for this basename: DDIMER:2 in the last 72 hours Hemoglobin A1C No results found for this basename: HGBA1C in the last 72 hours Fasting Lipid Panel No results found for this basename: CHOL,HDL,LDLCALC,TRIG,CHOLHDL,LDLDIRECT in the last 72 hours Thyroid Function Tests No results found for this basename: TSH,T4TOTAL,FREET3,T3FREE,THYROIDAB in the last 72 hours    Radiology/Studies  Dg Chest 2 View  07/25/2012  *RADIOLOGY REPORT*  Clinical Data: Chest pain, recent pacer placement  CHEST - 2 VIEW  Comparison: 07/24/2012  Findings: Moderate cardiac silhouette  enlargement stable.  No change in position of pacer leads or pulse generator.  No change in position of right PICC line. Mild left lower lobe atelectasis. Persistent small right effusion with underlying airspace process.  IMPRESSION: No significant change from comparison study.   Original Report Authenticated By: Esperanza Heir, M.D.    Dg Chest 2 View  07/24/2012  *RADIOLOGY REPORT*  Clinical Data: Shortness of breath and congestive heart failure.  CHEST - 2 VIEW  Comparison: 07/21/2012  Findings: Right-sided PICC line tip:  SVC.  Pacer noted with lead tips projecting over the right atrium and ventricle, and with a suspected coronary sinus pacer in the great cardiac vein.  Low lung volumes are present, causing crowding of the pulmonary vasculature.  Moderately enlarged cardiopericardial silhouette noted.  The patient has had prior CABG.  Continued indistinctness of the right hemidiaphragm noted with mild right lower lobe airspace opacity and possibly a right pleural effusion.  No overt pulmonary edema.  IMPRESSION:  1.  Right lower lobe airspace opacity is nonspecific, but likely a combination of atelectasis and pleural effusion. 2.  Cardiomegaly.   Original Report Authenticated By: Gaylyn Rong, M.D.    Dg Chest Right Decubitus  07/17/2012  *RADIOLOGY REPORT*  Clinical Data: Shortness of breath, cough.  CHEST - RIGHT DECUBITUS  Comparison: 07/17/2012  Findings: Prior CABG.  There is cardiomegaly.  Vascular congestion. Diffuse right lung airspace disease, likely slightly increased since prior study.  Continued right effusion, stable.  IMPRESSION: Diffuse right lung airspace disease, slightly increased since prior study.  Small right effusion.   Original Report Authenticated By: Charlett Nose, M.D.    Dg Chest Port 1 View  07/21/2012  *RADIOLOGY REPORT*  Clinical Data: Follow up vascular congestion  PORTABLE CHEST - 1 VIEW  Comparison: Prior chest x-ray 07/19/2012  Findings: Unchanged position of right  upper extremity approach PICC.  The PICC tip terminates at the mid superior vena cava.  Left IJ approach cardiac rhythm maintenance device with leads projecting over the right atrium, right ventricle and within the vein overlying the left ventricle.  Slightly improved pulmonary vascular congestion.  Stable cardiomegaly.  Small right pleural effusion has decreased in size.  There is residual right basilar atelectasis.  IMPRESSION:  1.  Interval resolution of pulmonary vascular congestion. 2.  Decreasing right pleural effusion with some persistent basilar atelectasis. 3.  Stable cardiomegaly.   Original Report Authenticated By: Malachy Moan, M.D.    Dg Chest Port 1 View  07/19/2012  *RADIOLOGY REPORT*  Clinical Data: Respiratory failure, post pacemaker insertion  PORTABLE CHEST - 1 VIEW  Comparison: Portable chest x-ray of 07/18/2012  Findings: The lungs are not well aerated with probable effusions right greater than left and  minimal pulmonary vascular congestion. Cardiomegaly is stable.  A dual lead permanent pacemaker is now present and no pneumothorax is seen. Right PICC line is unchanged in position.  IMPRESSION: Permanent pacemaker now present.  No change in poor aeration with effusions and probable pulmonary vascular congestion.   Original Report Authenticated By: Dwyane Dee, M.D.    Dg Chest Port 1 View  07/18/2012  *RADIOLOGY REPORT*  Clinical Data: Respiratory failure  PORTABLE CHEST - 1 VIEW  Comparison: Chest radiograph 07/17/2012  Findings: Right PICC line is unchanged.  Stable enlarged heart silhouette.  There is increased bilateral pleural effusions. Potential bibasilar air space disease.  IMPRESSION: Increased bilateral pleural effusions and cardiomegaly suggest congestive heart failure.   Original Report Authenticated By: Genevive Bi, M.D.    Dg Chest Port 1 View  07/17/2012  *RADIOLOGY REPORT*  Clinical Data: Shortness of breath, cough.  PORTABLE CHEST - 1 VIEW  Comparison: 07/13/2012   Findings: Prior CABG.  Cardiomegaly with vascular congestion. Right lower lobe airspace opacity is increased.  Probable small right effusion.  IMPRESSION: Increasing right lower lobe atelectasis or infiltrate.  Continue small right effusion.   Original Report Authenticated By: Charlett Nose, M.D.    Dg Chest Port 1 View  07/13/2012  *RADIOLOGY REPORT*  Clinical Data: History of line placement.  PORTABLE CHEST - 1 VIEW  Comparison: 07/12/2012.  Findings: Tip of right brachiocephalic venous catheter terminates in the superior vena cava lower portion.  No pneumothorax is evident.  Left lung is free of infiltrates.  There is hazy opacity in the inferior right lung with element of atelectasis and associated right pleural effusion.  There is stable cardiac silhouette enlargement. The patient has undergone previous median sternotomy and coronary artery bypass grafting.  IMPRESSION: Tip of right brachiocephalic venous catheter terminates in the superior vena cava lower portion.  No pneumothorax is evident. Stable cardiac silhouette enlargement.  Hazy opacity inferiorly on the right with atelectasis and associated right pleural effusion without significant change.   Original Report Authenticated By: Onalee Hua Call    Dg Chest Port 1 View  07/12/2012  *RADIOLOGY REPORT*  Clinical Data: CHF.  Short of breath.  PORTABLE CHEST - 1 VIEW  Comparison: 05/25/2012  Findings: Heart remains moderately enlarged.  Right pleural effusion and basilar pulmonary parenchymal opacity have developed. Minimal atelectasis at the left base.  Normal vascularity.  No pneumothorax.  IMPRESSION: Cardiomegaly.  Right pleural effusion and basilar consolidation.   Original Report Authenticated By: Jolaine Click, M.D.     ASSESSMENT AND PLAN  1.  SP ablation and pacer placement after AV node ablation.  2.  Minimal tenderness of pacer site.   3.  Systolic CHF 4.  Atrial fibrillation 5.  DM    DM is poorly controlled.  Dr. Timothy Lasso is addressing.   Pacer sight slightly puffy---Will get EP to address.  Now on warfarin managed by pharmacy.  Wt is up slightly, and BUN/Cr are slightly improved.  She is probably close to being ready for dc, but hopefully we can get glucoses slightly better controlled to avoid infection risk.    Signed, Shawnie Pons MD, [facility], [facility]

## 2012-07-26 DIAGNOSIS — D649 Anemia, unspecified: Secondary | ICD-10-CM

## 2012-07-26 DIAGNOSIS — I428 Other cardiomyopathies: Secondary | ICD-10-CM

## 2012-07-26 DIAGNOSIS — I4891 Unspecified atrial fibrillation: Secondary | ICD-10-CM | POA: Diagnosis present

## 2012-07-26 DIAGNOSIS — R5381 Other malaise: Secondary | ICD-10-CM | POA: Diagnosis present

## 2012-07-26 LAB — BASIC METABOLIC PANEL
BUN: 39 mg/dL — ABNORMAL HIGH (ref 6–23)
Chloride: 97 mEq/L (ref 96–112)
Creatinine, Ser: 1.44 mg/dL — ABNORMAL HIGH (ref 0.50–1.10)
GFR calc Af Amer: 39 mL/min — ABNORMAL LOW (ref >90)
GFR calc non Af Amer: 34 mL/min — ABNORMAL LOW (ref >90)

## 2012-07-26 LAB — GLUCOSE, CAPILLARY: Glucose-Capillary: 242 mg/dL — ABNORMAL HIGH (ref 70–99)

## 2012-07-26 MED ORDER — WARFARIN SODIUM 7.5 MG PO TABS
7.5000 mg | ORAL_TABLET | Freq: Once | ORAL | Status: AC
  Administered 2012-07-26: 7.5 mg via ORAL
  Filled 2012-07-26 (×2): qty 1

## 2012-07-26 MED ORDER — INSULIN ASPART 100 UNIT/ML ~~LOC~~ SOLN: 3.0000 [IU] | Freq: Three times a day (TID) | SUBCUTANEOUS | Status: DC

## 2012-07-26 MED ORDER — POTASSIUM CHLORIDE CRYS ER 10 MEQ PO TBCR: 10.0000 meq | EXTENDED_RELEASE_TABLET | Freq: Every day | ORAL | Status: DC

## 2012-07-26 MED ORDER — INSULIN GLARGINE 100 UNIT/ML ~~LOC~~ SOLN: 39.0000 [IU] | Freq: Two times a day (BID) | SUBCUTANEOUS | Status: DC

## 2012-07-26 NOTE — Progress Notes (Addendum)
Patient Name: [name] Date of Encounter: 07/26/2012     Principal Problem:  *Acute on chronic combined systolic and diastolic congestive heart failure, NYHA class 4 Active Problems:  Hypothyroidism  CAD (coronary artery disease)  Atrial tachycardia  CKD (chronic kidney disease) stage 3, GFR 30-59 ml/min  Diabetes mellitus  Cardiomyopathy-cause unknown ? rate related v ischemic  Acute respiratory failure  Pulmonary edema  Pleural effusion    SUBJECTIVE  Seems much improved and wants to go home.  Seen by EP as noted.  No problems with pacer site per EP.  Insulin adjustments made by Dr. Timothy Lasso.  No chest pain.  No current shortness of breath.  O2 sat 100% on RA.    CURRENT MEDS    . amitriptyline  75 mg Oral QHS  . aspirin EC  81 mg Oral Daily  . atorvastatin  40 mg Oral QPM  . docusate sodium  100 mg Oral BID  . donepezil  5 mg Oral QHS  . furosemide  40 mg Oral Daily  . insulin aspart  0-20 Units Subcutaneous TID WC  . insulin aspart  0-5 Units Subcutaneous QHS  . insulin aspart  3 Units Subcutaneous TID WC  . insulin glargine  39 Units Subcutaneous BID  . levothyroxine  100 mcg Oral QAC breakfast  . metoprolol succinate  50 mg Oral BID  . pantoprazole  80 mg Oral Q1200  . sodium chloride  10-40 mL Intracatheter Q12H  . warfarin  7.5 mg Oral ONCE-1800  . Warfarin - Pharmacist Dosing Inpatient   Does not apply q1800    OBJECTIVE  Filed Vitals:   07/25/12 1430 07/25/12 2045 07/26/12 0503 07/26/12 1022  BP: 130/64 124/57 125/58 112/52  Pulse: 78 79 82 80  Temp:  98.6 F (37 C) 98.5 F (36.9 C)   TempSrc:  Oral Oral   Resp: 20 20 20    Height:      Weight:   204 lb 6.4 oz (92.715 kg)   SpO2: 100% 100% 100%     Intake/Output Summary (Last 24 hours) at 07/26/12 1056 Last data filed at 07/26/12 1002  Gross per 24 hour  Intake    800 ml  Output   1500 ml  Net   -700 ml   Filed Weights   07/24/12 0436 07/25/12 0606 07/26/12 0503  Weight: 202 lb  9.6 oz (91.9 kg) 203 lb 4.2 oz (92.2 kg) 204 lb 6.4 oz (92.715 kg)    PHYSICAL EXAM  General: Pleasant, NAD. Neuro: Alert and oriented X 3. Moves all extremities spontaneously. Psych: Normal affect. HEENT:  Normal  Neck: Supple without bruits or JVD. Lungs:  Resp regular and unlabored, slight decrease BS at the bases.   Heart: distant HS, regular rhythm.   Abdomen: Soft, non-tender, non-distended, BS + x 4.  Extremities: No clubbing, cyanosis or edema. DP/PT/Radials 2+ and equal bilaterally.  Accessory Clinical Findings  CBC  Basename 07/25/12 0500 07/24/12 0545  WBC 8.3 8.8  NEUTROABS -- --  HGB 9.7* 10.0*  HCT 30.0* 30.1*  MCV 89.6 89.3  PLT 240 249   Basic Metabolic Panel  Basename 07/26/12 0500 07/25/12 0500  NA 133* 134*  K 3.9 3.6  CL 97 98  CO2 25 27  GLUCOSE 150* 221*  BUN 39* 38*  CREATININE 1.44* 1.51*  CALCIUM 9.5 9.0  MG -- --  PHOS -- --   Liver Function Tests No results found for this basename: AST:2,ALT:2,ALKPHOS:2,BILITOT:2,PROT:2,ALBUMIN:2 in the last 72  hours No results found for this basename: LIPASE:2,AMYLASE:2 in the last 72 hours Cardiac Enzymes No results found for this basename: CKTOTAL:3,CKMB:3,CKMBINDEX:3,TROPONINI:3 in the last 72 hours BNP No components found with this basename: POCBNP:3 D-Dimer No results found for this basename: DDIMER:2 in the last 72 hours Hemoglobin A1C No results found for this basename: HGBA1C in the last 72 hours Fasting Lipid Panel No results found for this basename: CHOL,HDL,LDLCALC,TRIG,CHOLHDL,LDLDIRECT in the last 72 hours Thyroid Function Tests No results found for this basename: TSH,T4TOTAL,FREET3,T3FREE,THYROIDAB in the last 72 hours  Radiology/Studies  Dg Chest 2 View  07/25/2012  *RADIOLOGY REPORT*  Clinical Data: Chest pain, recent pacer placement  CHEST - 2 VIEW  Comparison: 07/24/2012  Findings: Moderate cardiac silhouette enlargement stable.  No change in position of pacer leads or pulse  generator.  No change in position of right PICC line. Mild left lower lobe atelectasis. Persistent small right effusion with underlying airspace process.  IMPRESSION: No significant change from comparison study.   Original Report Authenticated By: Esperanza Heir, M.D.    Dg Chest 2 View  07/24/2012  *RADIOLOGY REPORT*  Clinical Data: Shortness of breath and congestive heart failure.  CHEST - 2 VIEW  Comparison: 07/21/2012  Findings: Right-sided PICC line tip:  SVC.  Pacer noted with lead tips projecting over the right atrium and ventricle, and with a suspected coronary sinus pacer in the great cardiac vein.  Low lung volumes are present, causing crowding of the pulmonary vasculature.  Moderately enlarged cardiopericardial silhouette noted.  The patient has had prior CABG.  Continued indistinctness of the right hemidiaphragm noted with mild right lower lobe airspace opacity and possibly a right pleural effusion.  No overt pulmonary edema.  IMPRESSION:  1.  Right lower lobe airspace opacity is nonspecific, but likely a combination of atelectasis and pleural effusion. 2.  Cardiomegaly.   Original Report Authenticated By: Gaylyn Rong, M.D.    Dg Chest Right Decubitus  07/17/2012  *RADIOLOGY REPORT*  Clinical Data: Shortness of breath, cough.  CHEST - RIGHT DECUBITUS  Comparison: 07/17/2012  Findings: Prior CABG.  There is cardiomegaly.  Vascular congestion. Diffuse right lung airspace disease, likely slightly increased since prior study.  Continued right effusion, stable.  IMPRESSION: Diffuse right lung airspace disease, slightly increased since prior study.  Small right effusion.   Original Report Authenticated By: Charlett Nose, M.D.    Dg Chest Port 1 View  07/21/2012  *RADIOLOGY REPORT*  Clinical Data: Follow up vascular congestion  PORTABLE CHEST - 1 VIEW  Comparison: Prior chest x-ray 07/19/2012  Findings: Unchanged position of right upper extremity approach PICC.  The PICC tip terminates at the mid  superior vena cava.  Left IJ approach cardiac rhythm maintenance device with leads projecting over the right atrium, right ventricle and within the vein overlying the left ventricle.  Slightly improved pulmonary vascular congestion.  Stable cardiomegaly.  Small right pleural effusion has decreased in size.  There is residual right basilar atelectasis.  IMPRESSION:  1.  Interval resolution of pulmonary vascular congestion. 2.  Decreasing right pleural effusion with some persistent basilar atelectasis. 3.  Stable cardiomegaly.   Original Report Authenticated By: Malachy Moan, M.D.    Dg Chest Port 1 View  07/19/2012  *RADIOLOGY REPORT*  Clinical Data: Respiratory failure, post pacemaker insertion  PORTABLE CHEST - 1 VIEW  Comparison: Portable chest x-ray of 07/18/2012  Findings: The lungs are not well aerated with probable effusions right greater than left and minimal pulmonary vascular congestion. Cardiomegaly is stable.  A dual lead permanent pacemaker is now present and no pneumothorax is seen. Right PICC line is unchanged in position.  IMPRESSION: Permanent pacemaker now present.  No change in poor aeration with effusions and probable pulmonary vascular congestion.   Original Report Authenticated By: Dwyane Dee, M.D.    Dg Chest Port 1 View  07/18/2012  *RADIOLOGY REPORT*  Clinical Data: Respiratory failure  PORTABLE CHEST - 1 VIEW  Comparison: Chest radiograph 07/17/2012  Findings: Right PICC line is unchanged.  Stable enlarged heart silhouette.  There is increased bilateral pleural effusions. Potential bibasilar air space disease.  IMPRESSION: Increased bilateral pleural effusions and cardiomegaly suggest congestive heart failure.   Original Report Authenticated By: Genevive Bi, M.D.    Dg Chest Port 1 View  07/17/2012  *RADIOLOGY REPORT*  Clinical Data: Shortness of breath, cough.  PORTABLE CHEST - 1 VIEW  Comparison: 07/13/2012  Findings: Prior CABG.  Cardiomegaly with vascular congestion.  Right lower lobe airspace opacity is increased.  Probable small right effusion.  IMPRESSION: Increasing right lower lobe atelectasis or infiltrate.  Continue small right effusion.   Original Report Authenticated By: Charlett Nose, M.D.    Dg Chest Port 1 View  07/13/2012  *RADIOLOGY REPORT*  Clinical Data: History of line placement.  PORTABLE CHEST - 1 VIEW  Comparison: 07/12/2012.  Findings: Tip of right brachiocephalic venous catheter terminates in the superior vena cava lower portion.  No pneumothorax is evident.  Left lung is free of infiltrates.  There is hazy opacity in the inferior right lung with element of atelectasis and associated right pleural effusion.  There is stable cardiac silhouette enlargement. The patient has undergone previous median sternotomy and coronary artery bypass grafting.  IMPRESSION: Tip of right brachiocephalic venous catheter terminates in the superior vena cava lower portion.  No pneumothorax is evident. Stable cardiac silhouette enlargement.  Hazy opacity inferiorly on the right with atelectasis and associated right pleural effusion without significant change.   Original Report Authenticated By: Onalee Hua Call    Dg Chest Port 1 View  07/12/2012  *RADIOLOGY REPORT*  Clinical Data: CHF.  Short of breath.  PORTABLE CHEST - 1 VIEW  Comparison: 05/25/2012  Findings: Heart remains moderately enlarged.  Right pleural effusion and basilar pulmonary parenchymal opacity have developed. Minimal atelectasis at the left base.  Normal vascularity.  No pneumothorax.  IMPRESSION: Cardiomegaly.  Right pleural effusion and basilar consolidation.   Original Report Authenticated By: Jolaine Click, M.D.     ASSESSMENT AND PLAN  1.  Acute on chronic Systolic heart failure  --- increase furosemide to oral bid, daily weights 2.  Needs prompt coumadin clinic visit.   3.  SP pacer  --  Needs pacer follow up.   4.  DM  -  See recent adjustments by Dr. Timothy Lasso.  5.  SVT sp ablation -- now on meds and  controlled.  6.  Anemia  -- will need OP fu closely  Is prone to volume overload and readmission so will need to be followed closely.  \More than 30 minutes combined dc time  Signed, Shawnie Pons MD, [facility], [place]

## 2012-07-26 NOTE — Progress Notes (Signed)
DC IV, DC Tele, DC Home. Discharge instructions and home medication discussed with patient and patient's husband. Patient and family denied any questions or concern. Patient leaving unit via wheelchair and appears in no acute distress.

## 2012-07-26 NOTE — Progress Notes (Signed)
07/26/12 1130 In to speak with pt. about [facility] services and pt. is interested.  Pt. has had [facility] in past and was satisfied with their services.  TC to [place], with [facility], to give referral for [facility] RN for HF management and HH PT.  Pt. states she may dc home today. Tera Mater, RN, BSN NCM [phone]

## 2012-07-26 NOTE — Progress Notes (Signed)
Physical Therapy Treatment and Discharge Patient Details Name: [name] MRN: [id number] DOB: 1933/05/23 Today's Date: 07/26/2012 Time: 0865-7846 PT Time Calculation (min): 13 min  PT Assessment / Plan / Recommendation Comments on Treatment Session  79 y/o adm with CHF exacerbation and uncontrolled a-fib (HR 160s) even on meds. Developed resp. distress with transfer to ICU and Bipap. 1/16 underwent pacemaker with AV node ablation. Goals met, pt to d/c home today. All further PT to be addressed at home (i.e. balance, activity tolerance).     Follow Up Recommendations  Home health PT;Supervision for mobility/OOB     Does the patient have the potential to tolerate intense rehabilitation     Barriers to Discharge        Equipment Recommendations  None recommended by PT    Recommendations for Other Services    Frequency     Plan All goals met and education completed, patient dischaged from PT services    Precautions / Restrictions Precautions Precautions: Fall Restrictions Weight Bearing Restrictions: No       Mobility  Bed Mobility Bed Mobility: Not assessed Transfers Transfers: Sit to Stand;Stand to Sit Sit to Stand: 6: Modified independent (Device/Increase time) Stand to Sit: 6: Modified independent (Device/Increase time) Ambulation/Gait Ambulation/Gait Assistance: 6: Modified independent (Device/Increase time) Assistive device: Rolling walker Gait Pattern: Step-through pattern Gait velocity: decreased      PT Goals Acute Rehab PT Goals PT Goal: Sit to Stand - Progress: Met PT Goal: Stand to Sit - Progress: Met PT Goal: Ambulate - Progress: Met  Visit Information  Last PT Received On: 07/26/12 Assistance Needed: +1    Subjective Data  Subjective: Ready to go home.    Cognition  Overall Cognitive Status: Appears within functional limits for tasks assessed/performed Arousal/Alertness: Awake/alert Orientation Level: Appears intact for tasks  assessed Behavior During Session: Jeremy Mclamb [facility] for tasks performed    Balance     End of Session PT - End of Session Equipment Utilized During Treatment: Gait belt Activity Tolerance: Patient tolerated treatment well Patient left: in chair;with call bell/phone within reach Nurse Communication: Mobility status   GP     [facility] HELEN 07/26/2012, 1:34 PM

## 2012-07-26 NOTE — Progress Notes (Signed)
ANTICOAGULATION CONSULT NOTE - Follow Up Consult  Pharmacy Consult for Coumadin Indication: atrial fibrillation  Allergies  Allergen Reactions  . Betadine (Povidone Iodine) Itching  . Capoten (Captopril) Other (See Comments)    unknown  . Codone (Hydrocodone)   . Methadone   . Penicillins Swelling  . Propoxyphene And Methadone     Intolerance to darvocet    Patient Measurements: Height: 5\' 3"  (160 cm) Weight: 204 lb 6.4 oz (92.715 kg) IBW/kg (Calculated) : 52.4   Vital Signs: Temp: 98.5 F (36.9 C) (01/23 0503) Temp src: Oral (01/23 0503) BP: 125/58 mmHg (01/23 0503) Pulse Rate: 82  (01/23 0503)  Labs:  Basename 07/26/12 0500 07/25/12 0500 07/24/12 0545  HGB -- 9.7* 10.0*  HCT -- 30.0* 30.1*  PLT -- 240 249  APTT -- -- --  LABPROT 22.1* 24.1* 22.5*  INR 2.03* 2.28* 2.08*  HEPARINUNFRC -- -- --  CREATININE 1.44* 1.51* 1.56*  CKTOTAL -- -- --  CKMB -- -- --  TROPONINI -- -- --    Estimated Creatinine Clearance: 35.4 ml/min (by C-G formula based on Cr of 1.44).  Assessment: 79yof continues on coumadin for afib. INR is therapeutic. CBC is stable. No bleeding reported. Eating 100% of meals.   Home dose: 7.5mg  daily except 5mg  on Tue/Thurs  Goal of Therapy:  INR 2-2.5 Monitor platelets by anticoagulation protocol: Yes   Plan:  1) Coumadin 7.5mg  x 1, on d/c she may benefit from alternating the 5mg  and 7.5mg  dose. 2) Follow up INR in AM if not d/c home   Thank you,  Brett Fairy, PharmD, BCPS 07/26/2012 10:05 AM

## 2012-07-26 NOTE — Progress Notes (Signed)
PICC line removed per order for d/c to home.  Pt and family verbalized understanding of signs of infection and bleeding and interventions using teach back method.  Pressure applied to site, vaseline guaze applied and dry 4x4.  No bleeding noted or signs of infection present.  No ADN.

## 2012-07-26 NOTE — Discharge Summary (Signed)
CARDIOLOGY DISCHARGE SUMMARY   Patient ID: [name] MRN: [id number] DOB/AGE: 11-04-1932 79 y.o.  Admit date: 07/11/2012 Discharge date: 07/26/2012  Primary Discharge Diagnosis:  *Acute on chronic combined systolic and diastolic congestive heart failure, NYHA class 4 Secondary Discharge Diagnosis: . Atrial fibrillation with rapid ventricular response  Hypothyroidism  CAD (coronary artery disease)  Atrial tachycardia  CKD (chronic kidney disease) stage 3, GFR 30-59 ml/min  Diabetes mellitus  Cardiomyopathy-cause unknown ? rate related v ischemic  Acute respiratory failure  Pulmonary edema  Pleural effusion Deconditioning Chronic anticoagulation with Coumadin  Consults: CCM, Dr. Creola Corn   Procedures:  BiPAP, AV node ablation, insertion of a bi-V PPM  Hospital Course:   Is a 79 year old female with a history CHF, CAD and atrial fibrillation. She was seen by Dr. Swaziland on 07/11/2012 and admitted for heart failure and rapid atrial fibrillation.  Rapid atrial fibrillation:  She had failed amiodarone therapy due to QT prolongation and intolerance. She is also not a candidate for Tikosyn, sotalol, 1C antiarrhythmics, or Multaq because of her coronary disease, congestive heart failure, and prolonged QT. She was started on diltiazem be and metoprolol. Rate control was poor. Dr. Ladona Ridgel evaluated this point and felt she was a candidate for AV node ablation and biventricular pacemaker for long-term management. A St. Jude Anthem RF BiV pacemaker, serial number [id number] was inserted once her respiratory status had improved on 07/18/2012. On 07/19/2012, she had an AV node ablation was successful. Currently she is AV pacing. A biventricular device was used because left ventricular dysfunction was seen on echocardiogram. There is concern that the cardiomyopathy is rate related and it is hoped this will improve with a biventricular device.  Acute on chronic systolic and diastolic CHF, class  IV: [name] was diuresed with IV Lasix. Her weight decreased by total of 17 pounds during her hospital stay. Her kidney function worsened with diuresis and this was followed very closely. Medications were adjusted to minimize this. Her BUN and creatinine on admission were 28/1.59. They peaked at 34/2.06 but were down to 39/1.4 for discharge. She'll be on Lasix at discharge and her renal function will be followed closely.  Hypothyroidism: She has a history of hypothyroidism and a TSH from November 2013 was within normal limits. This was not re-checked and she is continued on her home medication.  Acute respiratory failure: On 07/17/2012, Dr. Graciela Husbands felt this point is more short of breath. A chest x-ray showed increasing pleural effusion and pulmonary edema but the decubitus film did not show significant layering. She had increased wheezing with shortness of breath and required BiPAP. Her ECG showed a junctional rhythm. She was seen by the critical care medicine team and given additional Lasix for diuresis. She was also given nebulizers which helped with the wheezing. She had been on Cardizem for rate control of her atrial fibrillation but this was discontinued to allow her heart rate to increase. She gradually improved and was able to come off the BiPAP within 48 hours.   Diabetes mellitus: [name] has a history of diabetes and takes insulin for this. She was continued on insulin with sliding scale and her blood sugars were followed closely. Dr. Timothy Lasso saw [name] regularly and adjusted her diabetes medicines. She will be discharged with no changes he made and followup with him.  Chronic anticoagulation: [name] was on Coumadin prior to admission. Her INR was allowed to drift down prior to the pacemaker and her Coumadin was restarted after  the procedures. Her INR at discharge is therapeutic and she will get a recheck early next week.  Deconditioning: [name] had limited ability to ambulate prior to  admission because of increasing shortness of breath and volume overload. With her prolonged hospitalization, she was having more difficulties. She was seen by physical therapy and home health PT was recommended. This will be ordered and the patient has a rolling walker as well.  CAD: [name] has a history of coronary artery disease. Cardiac enzymes were checked and were negative. She was having no ischemic symptoms. Although her echocardiogram showed left ventricular dysfunction, there were no new wall motion abnormalities. Dr. 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 With her acute illness, no ischemic workup was performed while she was in the hospital. Dr. Swaziland will follow her for this as an outpatient.  After her pacemaker was inserted and her AV node was ablated, her medications were adjusted her blood pressure control. Her respiratory status had improved and her Lasix was changed to oral medicine. She was seen by physical therapy and Dr. Timothy Lasso made final adjustments to her diabetes medications. On 07/26/2012, [name] was evaluated by Dr. Riley Kill and considered stable for discharge, with close outpatient followup arranged.   Labs:   Lab Results  Component Value Date   WBC 8.3 07/25/2012   HGB 9.7* 07/25/2012   HCT 30.0* 07/25/2012   MCV 89.6 07/25/2012   PLT 240 07/25/2012     Lab 07/26/12 0500 07/20/12 0410  NA 133* --  K 3.9 --  CL 97 --  CO2 25 --  BUN 39* --  CREATININE 1.44* --  CALCIUM 9.5 --  PROT -- 8.0  BILITOT -- 0.7  ALKPHOS -- 116  ALT -- 18  AST -- 31  GLUCOSE 150* --   Lab Results  Component Value Date   TROPONINI <0.30 07/12/2012   Pro B Natriuretic peptide (BNP)  Date/Time Value Range Status  07/20/2012  4:10 AM 2508.0* 0 - 450 pg/mL Final  07/14/2012  9:28 AM 2025.0* 0 - 450 pg/mL Final    Basename 07/26/12 0500  INR 2.03*      Radiology: Dg Chest 2 View 07/25/2012  *RADIOLOGY REPORT*  Clinical Data: Chest pain, recent pacer  placement  CHEST - 2 VIEW  Comparison: 07/24/2012  Findings: Moderate cardiac silhouette enlargement stable.  No change in position of pacer leads or pulse generator.  No change in position of right PICC line. Mild left lower lobe atelectasis. Persistent small right effusion with underlying airspace process.  IMPRESSION: No significant change from comparison study.   Original Report Authenticated By: Esperanza Heir, M.D.    Dg Chest 2 View 07/24/2012  *RADIOLOGY REPORT*  Clinical Data: Shortness of breath and congestive heart failure.  CHEST - 2 VIEW  Comparison: 07/21/2012  Findings: Right-sided PICC line tip:  SVC.  Pacer noted with lead tips projecting over the right atrium and ventricle, and with a suspected coronary sinus pacer in the great cardiac vein.  Low lung volumes are present, causing crowding of the pulmonary vasculature.  Moderately enlarged cardiopericardial silhouette noted.  The patient has had prior CABG.  Continued indistinctness of the right hemidiaphragm noted with mild right lower lobe airspace opacity and possibly a right pleural effusion.  No overt pulmonary edema.  IMPRESSION:  1.  Right lower lobe airspace opacity is nonspecific, but likely a combination of atelectasis and pleural effusion. 2.  Cardiomegaly.   Original Report Authenticated By: Gaylyn Rong, M.D.  Dg Chest Right Decubitus 07/17/2012  *RADIOLOGY REPORT*  Clinical Data: Shortness of breath, cough.  CHEST - RIGHT DECUBITUS  Comparison: 07/17/2012  Findings: Prior CABG.  There is cardiomegaly.  Vascular congestion. Diffuse right lung airspace disease, likely slightly increased since prior study.  Continued right effusion, stable.  IMPRESSION: Diffuse right lung airspace disease, slightly increased since prior study.  Small right effusion.   Original Report Authenticated By: Charlett Nose, M.D.    Dg Chest Port 1 View 07/21/2012  *RADIOLOGY REPORT*  Clinical Data: Follow up vascular congestion  PORTABLE CHEST - 1 VIEW   Comparison: Prior chest x-ray 07/19/2012  Findings: Unchanged position of right upper extremity approach PICC.  The PICC tip terminates at the mid superior vena cava.  Left IJ approach cardiac rhythm maintenance device with leads projecting over the right atrium, right ventricle and within the vein overlying the left ventricle.  Slightly improved pulmonary vascular congestion.  Stable cardiomegaly.  Small right pleural effusion has decreased in size.  There is residual right basilar atelectasis.  IMPRESSION:  1.  Interval resolution of pulmonary vascular congestion. 2.  Decreasing right pleural effusion with some persistent basilar atelectasis. 3.  Stable cardiomegaly.   Original Report Authenticated By: Malachy Moan, M.D.    Dg Chest Port 1 View 07/19/2012  *RADIOLOGY REPORT*  Clinical Data: Respiratory failure, post pacemaker insertion  PORTABLE CHEST - 1 VIEW  Comparison: Portable chest x-ray of 07/18/2012  Findings: The lungs are not well aerated with probable effusions right greater than left and minimal pulmonary vascular congestion. Cardiomegaly is stable.  A dual lead permanent pacemaker is now present and no pneumothorax is seen. Right PICC line is unchanged in position.  IMPRESSION: Permanent pacemaker now present.  No change in poor aeration with effusions and probable pulmonary vascular congestion.   Original Report Authenticated By: Dwyane Dee, M.D.    Dg Chest Port 1 View 07/18/2012  *RADIOLOGY REPORT*  Clinical Data: Respiratory failure  PORTABLE CHEST - 1 VIEW  Comparison: Chest radiograph 07/17/2012  Findings: Right PICC line is unchanged.  Stable enlarged heart silhouette.  There is increased bilateral pleural effusions. Potential bibasilar air space disease.  IMPRESSION: Increased bilateral pleural effusions and cardiomegaly suggest congestive heart failure.   Original Report Authenticated By: Genevive Bi, M.D.    Dg Chest Port 1 View 07/17/2012  *RADIOLOGY REPORT*  Clinical Data:  Shortness of breath, cough.  PORTABLE CHEST - 1 VIEW  Comparison: 07/13/2012  Findings: Prior CABG.  Cardiomegaly with vascular congestion. Right lower lobe airspace opacity is increased.  Probable small right effusion.  IMPRESSION: Increasing right lower lobe atelectasis or infiltrate.  Continue small right effusion.   Original Report Authenticated By: Charlett Nose, M.D.    Dg Chest Port 1 View 07/13/2012  *RADIOLOGY REPORT*  Clinical Data: History of line placement.  PORTABLE CHEST - 1 VIEW  Comparison: 07/12/2012.  Findings: Tip of right brachiocephalic venous catheter terminates in the superior vena cava lower portion.  No pneumothorax is evident.  Left lung is free of infiltrates.  There is hazy opacity in the inferior right lung with element of atelectasis and associated right pleural effusion.  There is stable cardiac silhouette enlargement. The patient has undergone previous median sternotomy and coronary artery bypass grafting.  IMPRESSION: Tip of right brachiocephalic venous catheter terminates in the superior vena cava lower portion.  No pneumothorax is evident. Stable cardiac silhouette enlargement.  Hazy opacity inferiorly on the right with atelectasis and associated right pleural effusion without significant change.  Original Report Authenticated By: Onalee Hua Call    Dg Chest Port 1 View 07/12/2012  *RADIOLOGY REPORT*  Clinical Data: CHF.  Short of breath.  PORTABLE CHEST - 1 VIEW  Comparison: 05/25/2012  Findings: Heart remains moderately enlarged.  Right pleural effusion and basilar pulmonary parenchymal opacity have developed. Minimal atelectasis at the left base.  Normal vascularity.  No pneumothorax.  IMPRESSION: Cardiomegaly.  Right pleural effusion and basilar consolidation.   Original Report Authenticated By: Jolaine Click, M.D.     PPM : S/P St. Jude Anthem RF BiV pacemaker, serial number [id number]  RESULTS: This demonstrate successful implantation of a biventricular  pacemaker. AV node  ablation was not carried out because of the long  nature of the procedure and because of concern that the LV lead was not  going to be as stable as we would like. For this reason, we will plan  to observe the patient overnight with AV node ablation in the next 24  hours.  EKG: AV Pacing, rate 80  Echo:  07/15/2012 Study Conclusions - Left ventricle: The cavity size was normal. The estimated ejection fraction was 30%. Features are consistent with a pseudonormal left ventricular filling pattern, with concomitant abnormal relaxation and increased filling pressure (grade 2 diastolic dysfunction). - Ventricular septum: Septal motion showed moderate paradox. These changes are consistent with a left bundle branch block. - Aortic valve: Thickening. Severe diffuse thickening and calcification. There was mild stenosis. Valve area: 1.08cm^2(VTI). Valve area: 1cm^2 (Vmax). - Mitral valve: Calcified annulus. Mildly thickened leaflets . Mild regurgitation. - Left atrium: The atrium was mildly dilated. - Right ventricle: The cavity size was mildly dilated. - Right atrium: The atrium was moderately dilated. - Atrial septum: No defect or patent foramen ovale was identified. - Tricuspid valve: Moderate-severe regurgitation. - Pulmonary arteries: PA peak pressure: 34mm Hg (S).   FOLLOW UP PLANS AND APPOINTMENTS Allergies  Allergen Reactions  . Betadine (Povidone Iodine) Itching  . Capoten (Captopril) Other (See Comments)    unknown  . Codone (Hydrocodone)   . Methadone   . Penicillins Swelling  . Propoxyphene And Methadone     Intolerance to darvocet     Medication List     As of 07/26/2012  3:51 PM    STOP taking these medications         glimepiride 4 MG tablet   Commonly known as: AMARYL      TAKE these medications         amitriptyline 25 MG tablet   Commonly known as: ELAVIL   Take 75 mg by mouth at bedtime.      aspirin EC 81 MG tablet   Take 81 mg by mouth daily.        atorvastatin 40 MG tablet   Commonly known as: LIPITOR   Take 40 mg by mouth every evening.      diltiazem 240 MG 24 hr capsule   Commonly known as: CARDIZEM CD   Take 1 capsule (240 mg total) by mouth daily.      donepezil 5 MG tablet   Commonly known as: ARICEPT   Take 5 mg by mouth at bedtime.      esomeprazole 40 MG capsule   Commonly known as: NEXIUM   Take 40 mg by mouth daily before breakfast.      FE TABS PO   Take by mouth.      FISH OIL PO   Take by mouth.      furosemide 40  MG tablet   Commonly known as: LASIX   Take 1 tablet (40 mg total) by mouth 2 (two) times daily.      insulin aspart 100 UNIT/ML injection   Commonly known as: novoLOG   Inject 3 Units into the skin 3 (three) times daily with meals.      insulin glargine 100 UNIT/ML injection   Commonly known as: LANTUS   Inject 39 Units into the skin 2 (two) times daily.      insulin lispro 100 UNIT/ML injection   Commonly known as: HUMALOG   Inject 0-15 Units into the skin 3 (three) times daily as needed. Home sliding scale      levothyroxine 100 MCG tablet   Commonly known as: SYNTHROID, LEVOTHROID   Take 100 mcg by mouth daily.      MAGNESIUM PO   Take by mouth.      metoprolol succinate 50 MG 24 hr tablet   Commonly known as: TOPROL-XL   Take 50 mg by mouth daily. Take with or immediately following a meal.      nitroGLYCERIN 0.4 MG SL tablet   Commonly known as: NITROSTAT   Place 0.4 mg under the tongue every 5 (five) minutes as needed. For chest pain      potassium chloride 10 MEQ tablet   Commonly known as: K-DUR,KLOR-CON   Take 1 tablet (10 mEq total) by mouth daily.      Vitamin D 2000 UNITS Caps   Take 2,000 capsules by mouth.      warfarin 5 MG tablet   Commonly known as: COUMADIN   Take 5-7.5 mg by mouth daily. 5mg  Tuesday and Thursday; 7.5mg  the rest of the week          Discharge Orders    Future Appointments: Provider: Department: Dept Phone: Center:   07/30/2012  2:30 PM [facility] Main [facility] [place]) [phone] [facility]   07/30/2012 3:00 PM Ok Anis, NP [facility] Main Office [place]) [phone] [facility]   07/30/2012 3:30 PM [facility] [phone] None   08/14/2012 2:45 PM Peter M Swaziland, MD [facility] Main Office [place]) [phone] [facility]   08/16/2012 8:45 AM Delcie Roch [facility] [phone] None   08/16/2012 9:00 AM Samul Dada, MD [facility] ONCOL[facility] [phone] None   08/16/2012 10:15 AM [facility] [phone] None   09/14/2012 11:30 AM Radene Gunning [facility] [phone] None   09/14/2012 11:45 AM [facility] [phone] None     Future Orders Please Complete By Expires   Diet - low sodium heart healthy      Diet Carb Modified      Increase activity slowly      (HEART FAILURE PATIENTS) Call MD:  Anytime you have any of the following symptoms: 1) 3 pound weight gain in 24 hours or 5 pounds in 1 week 2) shortness of breath, with or without a dry hacking cough 3) swelling in the hands, feet or stomach 4) if you have to sleep on extra pillows at night in order to breathe.        Follow-up Information    Follow up with Peter Swaziland, MD. On 08/14/2012. (at 2:45 pm)    Contact information:   [address] [place] [address] [phone]  Follow up with [place] [facility] On 07/30/2012. (Coumadin check at 3:30 pm)    Contact information:   [address] [place] [address]       Schedule an appointment as soon as possible for a visit with [name], MD. (f/u anemia and diabetes)    Contact information:   [address] [facility] [place]  [address] [phone]       Follow up with [place] [facility] [facility] On 07/30/2012. (Wound check and pacer check at [facility], then coumadin at 1:30 pm.)    Contact information:   [address] [place] [address]       Follow up with Nicolasa Ducking, NP. On 07/30/2012. (See for Dr Swaziland at 3:00 pm)    Contact information:   [address] [place] [address] [phone]          BRING ALL MEDICATIONS WITH YOU TO FOLLOW UP APPOINTMENTS  Time spent with patient to include physician time: 39 min Signed: Theodore Demark 07/26/2012, 3:51 PM Co-Sign MD

## 2012-07-26 NOTE — Progress Notes (Signed)
Subjective: i adjusted her DM meds and Insulin yesterday No CP or SOB. May go home today?  Objective: Vital signs in last 24 hours: Temp:  [98.5 F (36.9 C)-98.6 F (37 C)] 98.5 F (36.9 C) (01/23 0503) Pulse Rate:  [78-100] 82  (01/23 0503) Resp:  [16-20] 20  (01/23 0503) BP: (115-130)/(57-71) 125/58 mmHg (01/23 0503) SpO2:  [96 %-100 %] 100 % (01/23 0503) Weight:  [92.715 kg (204 lb 6.4 oz)] 92.715 kg (204 lb 6.4 oz) (01/23 0503) Weight change: 0.515 kg (1 lb 2.2 oz) Last BM Date: 07/23/12  CBG (last 3)   Basename 07/26/12 0648 07/25/12 2047 07/25/12 1548  GLUCAP 132* 242* 215*    Intake/Output from previous day:  Intake/Output Summary (Last 24 hours) at 07/26/12 0752 Last data filed at 07/26/12 0020  Gross per 24 hour  Intake    560 ml  Output   1300 ml  Net   -740 ml   01/22 0701 - 01/23 0700 In: 560 [P.O.:560] Out: 1300 [Urine:1300]   Physical Exam  General appearance: comfortable Resp: Distant but clear Cardio: reg/paced controlled. Pacer noted GI: soft, non-tender; bowel sounds normal; no masses,  no organomegaly Extremities: Min maleolar Edema   Lab Results:  Basename 07/26/12 0500 07/25/12 0500  NA 133* 134*  K 3.9 3.6  CL 97 98  CO2 25 27  GLUCOSE 150* 221*  BUN 39* 38*  CREATININE 1.44* 1.51*  CALCIUM 9.5 9.0  MG -- --  PHOS -- --    No results found for this basename: AST:2,ALT:2,ALKPHOS:2,BILITOT:2,PROT:2,ALBUMIN:2 in the last 72 hours   Basename 07/25/12 0500 07/24/12 0545  WBC 8.3 8.8  NEUTROABS -- --  HGB 9.7* 10.0*  HCT 30.0* 30.1*  MCV 89.6 89.3  PLT 240 249    Lab Results  Component Value Date   INR 2.03* 07/26/2012   INR 2.28* 07/25/2012   INR 2.08* 07/24/2012    No results found for this basename: CKTOTAL:3,CKMB:3,CKMBINDEX:3,TROPONINI:3 in the last 72 hours  No results found for this basename: TSH,T4TOTAL,FREET3,T3FREE,THYROIDAB in the last 72 hours  No results found for this basename:  VITAMINB12:2,FOLATE:2,FERRITIN:2,TIBC:2,IRON:2,RETICCTPCT:2 in the last 72 hours  Micro Results: No results found for this or any previous visit (from the past 240 hour(s)).   Studies/Results: Dg Chest 2 View  07/25/2012  *RADIOLOGY REPORT*  Clinical Data: Chest pain, recent pacer placement  CHEST - 2 VIEW  Comparison: 07/24/2012  Findings: Moderate cardiac silhouette enlargement stable.  No change in position of pacer leads or pulse generator.  No change in position of right PICC line. Mild left lower lobe atelectasis. Persistent small right effusion with underlying airspace process.  IMPRESSION: No significant change from comparison study.   Original Report Authenticated By: Esperanza Heir, M.D.      Medications: Scheduled:    . amitriptyline  75 mg Oral QHS  . aspirin EC  81 mg Oral Daily  . atorvastatin  40 mg Oral QPM  . docusate sodium  100 mg Oral BID  . donepezil  5 mg Oral QHS  . furosemide  40 mg Oral Daily  . insulin aspart  0-20 Units Subcutaneous TID WC  . insulin aspart  0-5 Units Subcutaneous QHS  . insulin aspart  3 Units Subcutaneous TID WC  . insulin glargine  39 Units Subcutaneous BID  . levothyroxine  100 mcg Oral QAC breakfast  . metoprolol succinate  50 mg Oral BID  . pantoprazole  80 mg Oral Q1200  . sodium chloride  10-40  mL Intracatheter Q12H  . Warfarin - Pharmacist Dosing Inpatient   Does not apply q1800   Continuous:     Assessment/Plan: Principal Problem:  *Acute on chronic combined systolic and diastolic congestive heart failure, NYHA class 4 Active Problems:  Hypothyroidism  CAD (coronary artery disease)  Atrial tachycardia  CKD (chronic kidney disease) stage 3, GFR 30-59 ml/min  Diabetes mellitus  Cardiomyopathy-cause unknown ? rate related v ischemic  Acute respiratory failure  Pulmonary edema  Pleural effusion  PAfib - S/p Ablation and  Pacer  CHF/Pulm edema - Much better. Edema way down/Cr finally stable/baseline.   CKD3 -  following Cr.  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  Continue Lantus/ISS and after adjustments her CBG is much better this am  Basename 07/26/12 0648 07/25/12 2047 07/25/12 1548  GLUCAP 132* 242* 215*  DVT Prophylaxis/Antticoagulation- Coumadin - INR 2.03 Chronic Anemia - Hbg baseline @ 10 and she is there Slowly progressing towards D/C home.    LOS: 15 days   Dallana Mavity M 07/26/2012, 7:52 AM

## 2012-07-27 ENCOUNTER — Telehealth: Payer: Self-pay | Admitting: Cardiology

## 2012-07-27 NOTE — Telephone Encounter (Signed)
Spoke to patient's daughter Cristal Ford she stated patient was feeling better.States she is confused about how to take metoprolol.Advised to take metoprolol succ 50 mg daily.Also potassium 10 meq daily.Daughter knows about patient's appointments and understands how patient is to take medications.

## 2012-07-27 NOTE — Telephone Encounter (Signed)
Transitional care management call to patient. No answer on 07/27/12.

## 2012-07-27 NOTE — Telephone Encounter (Addendum)
Pt was taking  metoprolol 50mg  was taking twice a day, dc stated to take daily but didn't state how many, there was a change in potassium from 20 to 10 is this correct?  would like an rx for the 10, uses [facility]   pls call pt's dtr marquita hunter [phone]

## 2012-07-30 ENCOUNTER — Ambulatory Visit (INDEPENDENT_AMBULATORY_CARE_PROVIDER_SITE_OTHER): Payer: Medicare Other | Admitting: Pharmacist

## 2012-07-30 ENCOUNTER — Other Ambulatory Visit: Payer: Self-pay

## 2012-07-30 ENCOUNTER — Ambulatory Visit (INDEPENDENT_AMBULATORY_CARE_PROVIDER_SITE_OTHER): Payer: Medicare Other | Admitting: *Deleted

## 2012-07-30 ENCOUNTER — Ambulatory Visit (INDEPENDENT_AMBULATORY_CARE_PROVIDER_SITE_OTHER)
Admission: RE | Admit: 2012-07-30 | Discharge: 2012-07-30 | Disposition: A | Discharge status: Home / Self Care | Payer: Medicare Other | Source: Ambulatory Visit | Attending: Internal Medicine | Admitting: Internal Medicine

## 2012-07-30 ENCOUNTER — Encounter: Payer: Self-pay | Admitting: Nurse Practitioner

## 2012-07-30 ENCOUNTER — Ambulatory Visit (INDEPENDENT_AMBULATORY_CARE_PROVIDER_SITE_OTHER): Payer: Medicare Other | Admitting: Nurse Practitioner

## 2012-07-30 VITALS — BP 142/64 | HR 80 | Ht 63.0 in | Wt 204.8 lb

## 2012-07-30 DIAGNOSIS — I459 Conduction disorder, unspecified: Secondary | ICD-10-CM

## 2012-07-30 DIAGNOSIS — I4891 Unspecified atrial fibrillation: Secondary | ICD-10-CM

## 2012-07-30 DIAGNOSIS — I471 Supraventricular tachycardia: Secondary | ICD-10-CM

## 2012-07-30 DIAGNOSIS — I5043 Acute on chronic combined systolic (congestive) and diastolic (congestive) heart failure: Secondary | ICD-10-CM

## 2012-07-30 DIAGNOSIS — I509 Heart failure, unspecified: Secondary | ICD-10-CM

## 2012-07-30 DIAGNOSIS — I25810 Atherosclerosis of coronary artery bypass graft(s) without angina pectoris: Secondary | ICD-10-CM

## 2012-07-30 DIAGNOSIS — I5042 Chronic combined systolic (congestive) and diastolic (congestive) heart failure: Secondary | ICD-10-CM

## 2012-07-30 DIAGNOSIS — I428 Other cardiomyopathies: Secondary | ICD-10-CM

## 2012-07-30 DIAGNOSIS — I429 Cardiomyopathy, unspecified: Secondary | ICD-10-CM

## 2012-07-30 DIAGNOSIS — I4892 Unspecified atrial flutter: Secondary | ICD-10-CM

## 2012-07-30 DIAGNOSIS — Z7901 Long term (current) use of anticoagulants: Secondary | ICD-10-CM

## 2012-07-30 DIAGNOSIS — I10 Essential (primary) hypertension: Secondary | ICD-10-CM

## 2012-07-30 DIAGNOSIS — I498 Other specified cardiac arrhythmias: Secondary | ICD-10-CM

## 2012-07-30 LAB — PACEMAKER DEVICE OBSERVATION
AL IMPEDENCE PM: 387.5 Ohm
BAMS-0001: 160 {beats}/min
BAMS-0003: 90 {beats}/min
BATTERY VOLTAGE: 2.9478 V
RV LEAD AMPLITUDE: 7.6 mv
VENTRICULAR PACING PM: 99

## 2012-07-30 NOTE — Patient Instructions (Addendum)
Your physician recommends that you return for lab work in: today (bmet)  Your physician recommends that you schedule a follow-up appointment in: as scheduled

## 2012-07-30 NOTE — Progress Notes (Signed)
Wound check-PPM 

## 2012-07-30 NOTE — Progress Notes (Signed)
Patient Name: [name] Date of Encounter: 07/30/2012  Primary Care Provider:  [name], MD Primary Cardiologist:  P. [place], MD/G. Ladona Ridgel, MD  Patient Profile  79 y/o female recently admitted with afib and chf who presents for early hospital f/u.  Problem List   Past Medical History  Diagnosis Date  . Coronary artery disease     a. Severe two vessel; s/p CABG;  b. 05/2012 NSTEMI in setting of rapid aflutter;  c. 06/2012 Lexiscan cardiolite: small, mild reversible defect in lateral wall->mild ischemia, low-risk->med Rx.  Marland Kitchen Hypertension   . Type II diabetes mellitus   . Hyperlipidemia   . Diabetic neuropathy   . CVA (cerebral vascular accident) 2004  . Hypothyroidism   . Chronic anemia   . GERD (gastroesophageal reflux disease)   . Arthritis   . Memory loss, short term   . COPD (chronic obstructive pulmonary disease)   . Blood transfusion without reported diagnosis   . Chronic combined systolic and diastolic CHF (congestive heart failure)     a. 07/2012 Echo: EF 30%, Gr II DD, Mild AS/MR, Mod-Sev TR, mild bi-atrial and RV dil, PASP .  . Moderate mitral regurgitation     a. mod by TEE 2012, mild by echo 2013, 07/2012  . Severe tricuspid regurgitation     a. Severe by TEE 2012, mod by echo 2013, mod-sev by echo 07/2012  . Chronic vulvitis 07/1993  . Idiopathic anemia 2006  . Atrial fibrillation     a. Failed amiodarone (prolonged QT), not a candidate for class IC meds due to her CAD. No Multaq due to CHF hx;  b. 07/2012 s/p SJM Anthem RF Bi-V PPM, ser # [id number] and AV node RFCA;  b. chronic coumadin.  . Atrial flutter    Past Surgical History  Procedure Date  . Coronary artery bypass graft 06/2009    X3, LIMA to LAD, SVG to diagonal, SVG to the posterior descending artery.  . Tonsillectomy   . Knee arthroscopy 08/2001  . Transthoracic echocardiogram 11/12/2010     Ejection fraction felt to be around 50%.  Wall thickness was increased in a pattern of mild LVH.   Moderately dilated left atrium.  Mildly dilated right atrium. Right ventricle was mildly dilated with mildly reduced systolic function  . Cardiac catheterization 06/09/2009    inferior wall hypokinesia with ejection fraction of 55%.  . Abdominal hysterectomy   . Joint replacement   . Tee without cardioversion 05/23/2011    Procedure: TRANSESOPHAGEAL ECHOCARDIOGRAM (TEE);  Surgeon: Lewayne Bunting, MD;  Location: [facility];  Service: Cardiovascular;  Laterality: N/A;  . Incision and drainage abscess 05/07/2012    Procedure: INCISION AND DRAINAGE ABSCESS;  Surgeon: Ardeth Sportsman, MD;  Location: [facility] OR;  Service: General;  Laterality: N/A;  Groin wound  . Total abdominal hysterectomy 1979  . Breast biopsy Left  . Bilateral salpingectomy 10/2000  . Breast biopsy 09/18/2009    fibrocystic change  . Coronary artery bypass graft 06/2009    Allergies  Allergies  Allergen Reactions  . Betadine (Povidone Iodine) Itching  . Capoten (Captopril) Other (See Comments)    unknown  . Codone (Hydrocodone)   . Methadone   . Penicillins Swelling  . Propoxyphene And Methadone     Intolerance to darvocet    HPI  79 y/o female with the above problem list.  She was admitted to [facility] on 1/8 secondary to rapid afib and acute on chronic combined chf.  During her  hospitalization, she briefly required bi-pap and diuresed 17 lbs.  Echo showed EF of 30%, in the setting of afib - it was felt that that may be tachy mediated.  Because of multiple co-morbidities, she was not felt to be a candidate for a number of antiarrhythmics and EP recommended placement of a bi-ventricular pacemaker followed by av nodal ablation.  These procedures were carried out on 1/15 and 1/16 respectively.  She was eventually d/c'd on 1/23.  Since d/c, she has done very well.  She has noticed a significant difference in her general well being and specifically dyspnea.  Her weight has been stable at 201 pounds on her home scale she denies  dyspnea on exertion, PND, orthopnea, dizziness, syncope, or early satiety.  She has only minimal lower extremity edema which is significantly improved.  She has been tolerating her medications well and has noted some reduction in urine output despite taking Lasix 40 mg twice a day.  She's not had any chest pain.  She was seen in device clinic earlier today.  Home Medications  Prior to Admission medications   Medication Sig Start Date End Date Taking? Authorizing Provider  amitriptyline (ELAVIL) 25 MG tablet Take 75 mg by mouth at bedtime.      Historical Provider, MD  aspirin EC 81 MG tablet Take 81 mg by mouth daily.    Historical Provider, MD  atorvastatin (LIPITOR) 40 MG tablet Take 40 mg by mouth every evening.     Historical Provider, MD  Cholecalciferol (VITAMIN D) 2000 UNITS CAPS Take 2,000 capsules by mouth.    Historical Provider, MD  diltiazem (CARDIZEM CD) 240 MG 24 hr capsule Take 1 capsule (240 mg total) by mouth daily. 05/30/12   Ok Anis, NP  donepezil (ARICEPT) 5 MG tablet Take 5 mg by mouth at bedtime.    Historical Provider, MD  esomeprazole (NEXIUM) 40 MG capsule Take 40 mg by mouth daily before breakfast.      Historical Provider, MD  Ferrous Sulfate (FE TABS PO) Take by mouth.    Historical Provider, MD  furosemide (LASIX) 40 MG tablet Take 1 tablet (40 mg total) by mouth 2 (two) times daily. 05/30/12   Ok Anis, NP  insulin aspart (NOVOLOG) 100 UNIT/ML injection Inject 3 Units into the skin 3 (three) times daily with meals. 07/26/12   Joline Salt Barrett, PA  insulin glargine (LANTUS) 100 UNIT/ML injection Inject 39 Units into the skin 2 (two) times daily. 07/26/12   Joline Salt Barrett, PA  insulin lispro (HUMALOG) 100 UNIT/ML injection Inject 0-15 Units into the skin 3 (three) times daily as needed. Home sliding scale    Historical Provider, MD  levothyroxine (SYNTHROID, LEVOTHROID) 100 MCG tablet Take 100 mcg by mouth daily.    Historical Provider, MD    MAGNESIUM PO Take by mouth.    Historical Provider, MD  metoprolol succinate (TOPROL-XL) 50 MG 24 hr tablet Take 50 mg by mouth daily. Take with or immediately following a meal. 05/30/12   Ok Anis, NP  nitroGLYCERIN (NITROSTAT) 0.4 MG SL tablet Place 0.4 mg under the tongue every 5 (five) minutes as needed. For chest pain    Historical Provider, MD  Omega-3 Fatty Acids (FISH OIL PO) Take by mouth.    Historical Provider, MD  potassium chloride SA (K-DUR,KLOR-CON) 10 MEQ tablet Take 1 tablet (10 mEq total) by mouth daily. 07/26/12   Joline Salt Barrett, PA  warfarin (COUMADIN) 5 MG tablet Take 5-7.5 mg by  mouth daily. 5mg  Tuesday and Thursday; 7.5mg  the rest of the week    Historical Provider, MD   Review of Systems  She is feeling much better overall.  She denies chest pain, palpitations, dyspnea, pnd, orthopnea, n, v, dizziness, syncope, weight gain, or early satiety. All other systems reviewed and are otherwise negative except as noted above.  Physical Exam  Blood pressure 142/64, pulse 80, height 5\' 3"  (1.6 m), weight 204 lb 12 oz (92.874 kg).  General: Pleasant, NAD Psych: Normal affect. Neuro: Alert and oriented X 3. Moves all extremities spontaneously. HEENT: Normal  Neck: Supple without bruits or JVD. Lungs:  Resp regular and unlabored, CTA. Heart: RRR no s3, s4, 2/6 syst murmur, lousest @ lusb/llsb. Abdomen: Soft, non-tender, non-distended, BS + x 4.  Extremities: No clubbing, cyanosis.  Trace bilat LE edema. DP/PT/Radials 2+ and equal bilaterally.  Accessory Clinical Findings  ECG - AV paced, 80.  Assessment & Plan  1.  Chronic combined systolic and diastolic congestive heart failure: The patient is feeling much better following biventricular pacemaker placement and AV nodal ablation.  Her volume is stable today and her weight has been stable at home at 201 pounds.  She has been tolerating her medications well and will remain on beta blocker therapy.  She is not  currently on an ACE inhibitor or ARB secondary to renal insufficiency during hospitalization.  We will followup a basic metabolic panel today.  At some point she will require repeat echocardiogram to evaluate LV function in the absence of atrial fibrillation this was felt that her cardiomyopathy may have well been tachycardia mediated.  2.  Atrial fibrillation: Status post biventricular permanent pacemaker and AV nodal ablation.  As above, she has been feeling much better since this procedure.  She remains on chronic Coumadin therapy and is followed closely in Coumadin clinic.  She is also on diltiazem therapy which, if LV function remains depressed we should plan to discontinue.  3.  Hypertension: Blood pressure slightly elevated in clinic today.  Patient and spouse reports that her pressure runs in the 130s at home.  They will continue to follow him that we'll not make any changes to her medications today.  Could consider switching diltiazem to amlodipine if necessary.  4.  Coronary artery disease: She denies chest pain.  She is on statin and beta blocker therapy.  She is not on aspirin in the setting of being on Coumadin.  5.  Disposition: Basic metabolic panel today and follow up with Dr. Swaziland in approximately 2 weeks as scheduled.   Nicolasa Ducking, NP 07/30/2012, 3:48 PM

## 2012-07-30 NOTE — Addendum Note (Signed)
Addended by: Tonita Phoenix on: 07/30/2012 04:26 PM   Modules accepted: Orders

## 2012-07-30 NOTE — Addendum Note (Signed)
Addended by: Forestine Chute on: 07/30/2012 03:02 PM   Modules accepted: Orders

## 2012-07-31 ENCOUNTER — Telehealth: Payer: Self-pay | Admitting: Cardiology

## 2012-07-31 ENCOUNTER — Telehealth: Payer: Self-pay

## 2012-07-31 LAB — BASIC METABOLIC PANEL
Chloride: 101 mEq/L (ref 96–112)
GFR: 38.98 mL/min — ABNORMAL LOW (ref >60.00)
Potassium: 4.1 mEq/L (ref 3.5–5.1)
Sodium: 137 mEq/L (ref 135–145)

## 2012-07-31 MED ORDER — WARFARIN SODIUM 5 MG PO TABS: 5.0000 mg | ORAL_TABLET | ORAL | Status: DC

## 2012-07-31 NOTE — Telephone Encounter (Signed)
Pt was in yesterday and needs refill of wafarin at [facility]

## 2012-07-31 NOTE — Telephone Encounter (Signed)
Message received from [facility] PA-C is as follows: "Her creatinine is up slightly. Let's reduce her from lasix 40mg  bid to lasix 40mg  in the am and 20mg  in the pm. She has f/u with [facility] in 2 wks at which time she should have a repeat bmet. She should continue to weigh herself daily and let us know if she goes up any." Pt was notified and agrees

## 2012-08-02 ENCOUNTER — Telehealth: Payer: Self-pay | Admitting: Cardiology

## 2012-08-02 ENCOUNTER — Encounter: Payer: Self-pay | Admitting: Cardiology

## 2012-08-02 NOTE — Telephone Encounter (Signed)
New problem:  At the home now     after evaluation today by [facility]. Patient is more suitable for outpatient standpoint  Than in home services. Need an order fax over

## 2012-08-02 NOTE — Telephone Encounter (Signed)
Returned call to [place] at [facility] no answer.LMTC.

## 2012-08-03 ENCOUNTER — Telehealth: Payer: Self-pay | Admitting: *Deleted

## 2012-08-03 ENCOUNTER — Telehealth: Payer: Self-pay | Admitting: Internal Medicine

## 2012-08-03 NOTE — Telephone Encounter (Signed)
Shaking all over x 2 days plus started with jaw quiver, can't hold glass or get own blood sugar, pt can't control the shaking. Pt denies any other symptom like urine freq/ or burning, no fever, pt is oriented and no new medications. Advised to call pcp for further advice. They agreed to plan.

## 2012-08-03 NOTE — Telephone Encounter (Signed)
[name] 08/03/2012 3:14 PM Signed  Per Joyce Gross lungs clear, urine clear but recent catheter in hospital. Denies pain, cough, or any other symptoms. Pacemaker site looked good and like it was healing well per Joyce Gross.  Did have a shivering spell this am when she first got up per Joyce Gross but she went on back to bed.  Nurse advised her to take Tylenol now and again in 4 hours and if fever doesn't improve or another shivering spell to go to emergency room.   Did call the husband and advised to call her PCP She should be evaluated today, verbalized understanding. Left message with Joyce Gross that I called and advised patient husband to call PCP [name] 08/03/2012 2:26 PM Signed  New problem:  [facility]  Temp 102.5 . Hr 100 . New pacemaker inserted set at 80. B/p 158/62 -laying . Weight 204. Yesterday 205 . Day before 202. Lasix has decrease because of lab reading.  This am when she woke up With the shakes last about 2 hours. blood sugar 148. appetite is good . No swelling.

## 2012-08-03 NOTE — Telephone Encounter (Signed)
New problem:   [facility] calling   Temp 102.5 . Hr 100 . New pacemaker inserted set at  80. B/p  158/62 -laying . Weight 204. Yesterday  205 . Day before 202. Lasix has decrease because of lab reading.    This am when she woke up  With the shakes last about  2 hours.  blood sugar 148. appetite is good . No swelling.

## 2012-08-03 NOTE — Telephone Encounter (Signed)
Per Joyce Gross lungs clear, urine clear but recent catheter in hospital.  Denies pain, cough, or any other symptoms. Pacemaker site looked good and like it was healing well per Joyce Gross.  Did have a shivering spell this am when she first got up per Joyce Gross but she went on back to bed.   Nurse advised her to take Tylenol now and again in 4 hours and if fever doesn't improve or another shivering spell to go to emergency room.   Did call the husband and advised to call her PCP  She should be evaluated today, verbalized understanding. Left message with Joyce Gross that I called and advised patient husband to call PCP

## 2012-08-03 NOTE — Telephone Encounter (Signed)
Pt just got out of the hospital and has been shaking all morning, denies chest pain,  pressure, SOB, dizziness, lightheadedness , husband very worried

## 2012-08-04 HISTORY — PX: PACEMAKER INSERTION: SHX728

## 2012-08-06 ENCOUNTER — Encounter (HOSPITAL_COMMUNITY): Payer: Self-pay | Admitting: *Deleted

## 2012-08-06 ENCOUNTER — Inpatient Hospital Stay (HOSPITAL_COMMUNITY)
Admission: AD | Admit: 2012-08-06 | Discharge: 2012-08-12 | DRG: 194 | Disposition: A | Discharge status: Home Health | Payer: Medicare Other | Source: Ambulatory Visit | Attending: Internal Medicine | Admitting: Internal Medicine

## 2012-08-06 ENCOUNTER — Inpatient Hospital Stay (HOSPITAL_COMMUNITY): Payer: Medicare Other

## 2012-08-06 DIAGNOSIS — Z950 Presence of cardiac pacemaker: Secondary | ICD-10-CM

## 2012-08-06 DIAGNOSIS — Z87891 Personal history of nicotine dependence: Secondary | ICD-10-CM

## 2012-08-06 DIAGNOSIS — I5042 Chronic combined systolic (congestive) and diastolic (congestive) heart failure: Secondary | ICD-10-CM | POA: Diagnosis present

## 2012-08-06 DIAGNOSIS — Z794 Long term (current) use of insulin: Secondary | ICD-10-CM

## 2012-08-06 DIAGNOSIS — Z951 Presence of aortocoronary bypass graft: Secondary | ICD-10-CM

## 2012-08-06 DIAGNOSIS — K219 Gastro-esophageal reflux disease without esophagitis: Secondary | ICD-10-CM | POA: Diagnosis present

## 2012-08-06 DIAGNOSIS — I252 Old myocardial infarction: Secondary | ICD-10-CM

## 2012-08-06 DIAGNOSIS — R509 Fever, unspecified: Secondary | ICD-10-CM

## 2012-08-06 DIAGNOSIS — N179 Acute kidney failure, unspecified: Secondary | ICD-10-CM | POA: Diagnosis present

## 2012-08-06 DIAGNOSIS — E11329 Type 2 diabetes mellitus with mild nonproliferative diabetic retinopathy without macular edema: Secondary | ICD-10-CM | POA: Diagnosis present

## 2012-08-06 DIAGNOSIS — Z823 Family history of stroke: Secondary | ICD-10-CM

## 2012-08-06 DIAGNOSIS — Z888 Allergy status to other drugs, medicaments and biological substances status: Secondary | ICD-10-CM

## 2012-08-06 DIAGNOSIS — Z7901 Long term (current) use of anticoagulants: Secondary | ICD-10-CM

## 2012-08-06 DIAGNOSIS — Z880 Allergy status to penicillin: Secondary | ICD-10-CM

## 2012-08-06 DIAGNOSIS — I079 Rheumatic tricuspid valve disease, unspecified: Secondary | ICD-10-CM | POA: Diagnosis present

## 2012-08-06 DIAGNOSIS — E1149 Type 2 diabetes mellitus with other diabetic neurological complication: Secondary | ICD-10-CM | POA: Diagnosis present

## 2012-08-06 DIAGNOSIS — A498 Other bacterial infections of unspecified site: Secondary | ICD-10-CM | POA: Diagnosis present

## 2012-08-06 DIAGNOSIS — I059 Rheumatic mitral valve disease, unspecified: Secondary | ICD-10-CM | POA: Diagnosis present

## 2012-08-06 DIAGNOSIS — E669 Obesity, unspecified: Secondary | ICD-10-CM | POA: Diagnosis present

## 2012-08-06 DIAGNOSIS — Z96659 Presence of unspecified artificial knee joint: Secondary | ICD-10-CM

## 2012-08-06 DIAGNOSIS — N058 Unspecified nephritic syndrome with other morphologic changes: Secondary | ICD-10-CM | POA: Diagnosis present

## 2012-08-06 DIAGNOSIS — I428 Other cardiomyopathies: Secondary | ICD-10-CM | POA: Diagnosis present

## 2012-08-06 DIAGNOSIS — I129 Hypertensive chronic kidney disease with stage 1 through stage 4 chronic kidney disease, or unspecified chronic kidney disease: Secondary | ICD-10-CM | POA: Diagnosis present

## 2012-08-06 DIAGNOSIS — D631 Anemia in chronic kidney disease: Secondary | ICD-10-CM | POA: Diagnosis present

## 2012-08-06 DIAGNOSIS — I4891 Unspecified atrial fibrillation: Secondary | ICD-10-CM | POA: Diagnosis present

## 2012-08-06 DIAGNOSIS — E118 Type 2 diabetes mellitus with unspecified complications: Secondary | ICD-10-CM | POA: Diagnosis present

## 2012-08-06 DIAGNOSIS — R627 Adult failure to thrive: Secondary | ICD-10-CM | POA: Diagnosis present

## 2012-08-06 DIAGNOSIS — Z8249 Family history of ischemic heart disease and other diseases of the circulatory system: Secondary | ICD-10-CM

## 2012-08-06 DIAGNOSIS — Z8673 Personal history of transient ischemic attack (TIA), and cerebral infarction without residual deficits: Secondary | ICD-10-CM

## 2012-08-06 DIAGNOSIS — I429 Cardiomyopathy, unspecified: Secondary | ICD-10-CM

## 2012-08-06 DIAGNOSIS — E1165 Type 2 diabetes mellitus with hyperglycemia: Secondary | ICD-10-CM | POA: Diagnosis present

## 2012-08-06 DIAGNOSIS — N183 Chronic kidney disease, stage 3 unspecified: Secondary | ICD-10-CM | POA: Diagnosis present

## 2012-08-06 DIAGNOSIS — I4892 Unspecified atrial flutter: Secondary | ICD-10-CM | POA: Diagnosis present

## 2012-08-06 DIAGNOSIS — R7881 Bacteremia: Secondary | ICD-10-CM | POA: Diagnosis present

## 2012-08-06 DIAGNOSIS — R531 Weakness: Secondary | ICD-10-CM | POA: Diagnosis present

## 2012-08-06 DIAGNOSIS — E785 Hyperlipidemia, unspecified: Secondary | ICD-10-CM | POA: Diagnosis present

## 2012-08-06 DIAGNOSIS — E1129 Type 2 diabetes mellitus with other diabetic kidney complication: Secondary | ICD-10-CM | POA: Diagnosis present

## 2012-08-06 DIAGNOSIS — J4489 Other specified chronic obstructive pulmonary disease: Secondary | ICD-10-CM | POA: Diagnosis present

## 2012-08-06 DIAGNOSIS — I509 Heart failure, unspecified: Secondary | ICD-10-CM | POA: Diagnosis present

## 2012-08-06 DIAGNOSIS — J189 Pneumonia, unspecified organism: Principal | ICD-10-CM | POA: Diagnosis present

## 2012-08-06 DIAGNOSIS — J449 Chronic obstructive pulmonary disease, unspecified: Secondary | ICD-10-CM | POA: Diagnosis present

## 2012-08-06 DIAGNOSIS — Z6838 Body mass index (BMI) 38.0-38.9, adult: Secondary | ICD-10-CM

## 2012-08-06 DIAGNOSIS — E039 Hypothyroidism, unspecified: Secondary | ICD-10-CM | POA: Diagnosis present

## 2012-08-06 DIAGNOSIS — I2510 Atherosclerotic heart disease of native coronary artery without angina pectoris: Secondary | ICD-10-CM | POA: Diagnosis present

## 2012-08-06 DIAGNOSIS — E1142 Type 2 diabetes mellitus with diabetic polyneuropathy: Secondary | ICD-10-CM | POA: Diagnosis present

## 2012-08-06 DIAGNOSIS — N189 Chronic kidney disease, unspecified: Secondary | ICD-10-CM | POA: Diagnosis present

## 2012-08-06 DIAGNOSIS — E1139 Type 2 diabetes mellitus with other diabetic ophthalmic complication: Secondary | ICD-10-CM | POA: Diagnosis present

## 2012-08-06 DIAGNOSIS — Z79899 Other long term (current) drug therapy: Secondary | ICD-10-CM

## 2012-08-06 DIAGNOSIS — Z7982 Long term (current) use of aspirin: Secondary | ICD-10-CM

## 2012-08-06 LAB — GLUCOSE, CAPILLARY
Glucose-Capillary: 45 mg/dL — ABNORMAL LOW (ref 70–99)
Glucose-Capillary: 110 mg/dL — ABNORMAL HIGH (ref 70–99)

## 2012-08-06 LAB — INFLUENZA PANEL BY PCR (TYPE A & B)
H1N1 flu by pcr: NOT DETECTED
Influenza B By PCR: NEGATIVE

## 2012-08-06 LAB — URINALYSIS, ROUTINE W REFLEX MICROSCOPIC
Bilirubin Urine: NEGATIVE
Nitrite: NEGATIVE
Specific Gravity, Urine: 1.016 (ref 1.005–1.030)
Urobilinogen, UA: 1 mg/dL (ref 0.0–1.0)

## 2012-08-06 LAB — URINE MICROSCOPIC-ADD ON

## 2012-08-06 LAB — PROTIME-INR: INR: 4.6 — ABNORMAL HIGH (ref 0.00–1.49)

## 2012-08-06 LAB — APTT: aPTT: 67 seconds — ABNORMAL HIGH (ref 24–37)

## 2012-08-06 MED ORDER — WARFARIN SODIUM 5 MG PO TABS: 5.0000 mg | ORAL_TABLET | ORAL | Status: DC

## 2012-08-06 MED ORDER — LEVOTHYROXINE SODIUM 100 MCG PO TABS
100.0000 ug | ORAL_TABLET | Freq: Every day | ORAL | Status: DC
  Administered 2012-08-07 – 2012-08-12 (×6): 100 ug via ORAL
  Filled 2012-08-06 (×7): qty 1

## 2012-08-06 MED ORDER — INSULIN ASPART 100 UNIT/ML ~~LOC~~ SOLN
0.0000 [IU] | Freq: Every day | SUBCUTANEOUS | Status: DC
  Administered 2012-08-06: 2 [IU] via SUBCUTANEOUS

## 2012-08-06 MED ORDER — WARFARIN - PHYSICIAN DOSING INPATIENT: Freq: Every day | Status: DC

## 2012-08-06 MED ORDER — IPRATROPIUM BROMIDE 0.02 % IN SOLN
0.5000 mg | Freq: Four times a day (QID) | RESPIRATORY_TRACT | Status: DC
  Administered 2012-08-06 (×2): 0.5 mg via RESPIRATORY_TRACT
  Filled 2012-08-06 (×2): qty 2.5

## 2012-08-06 MED ORDER — ALBUTEROL SULFATE (5 MG/ML) 0.5% IN NEBU
2.5000 mg | INHALATION_SOLUTION | Freq: Four times a day (QID) | RESPIRATORY_TRACT | Status: DC
  Administered 2012-08-06: 5 mg via RESPIRATORY_TRACT
  Administered 2012-08-06: 2.5 mg via RESPIRATORY_TRACT
  Filled 2012-08-06 (×2): qty 0.5

## 2012-08-06 MED ORDER — INSULIN GLARGINE 100 UNIT/ML ~~LOC~~ SOLN
30.0000 [IU] | Freq: Two times a day (BID) | SUBCUTANEOUS | Status: DC
  Administered 2012-08-06 – 2012-08-07 (×3): 30 [IU] via SUBCUTANEOUS

## 2012-08-06 MED ORDER — NITROGLYCERIN 0.4 MG SL SUBL: 0.4000 mg | SUBLINGUAL_TABLET | SUBLINGUAL | Status: DC | PRN

## 2012-08-06 MED ORDER — ASPIRIN EC 81 MG PO TBEC
81.0000 mg | DELAYED_RELEASE_TABLET | Freq: Every day | ORAL | Status: DC
  Administered 2012-08-06: 81 mg via ORAL
  Filled 2012-08-06 (×2): qty 1

## 2012-08-06 MED ORDER — DONEPEZIL HCL 5 MG PO TABS
5.0000 mg | ORAL_TABLET | Freq: Every day | ORAL | Status: DC
  Administered 2012-08-06 – 2012-08-11 (×6): 5 mg via ORAL
  Filled 2012-08-06 (×8): qty 1

## 2012-08-06 MED ORDER — ONDANSETRON HCL 4 MG PO TABS: 4.0000 mg | ORAL_TABLET | Freq: Four times a day (QID) | ORAL | Status: DC | PRN

## 2012-08-06 MED ORDER — VANCOMYCIN HCL IN DEXTROSE 1-5 GM/200ML-% IV SOLN: 1000.0000 mg | INTRAVENOUS | Status: DC

## 2012-08-06 MED ORDER — ACETAMINOPHEN 650 MG RE SUPP: 650.0000 mg | Freq: Four times a day (QID) | RECTAL | Status: DC | PRN

## 2012-08-06 MED ORDER — METOPROLOL SUCCINATE ER 50 MG PO TB24
50.0000 mg | ORAL_TABLET | Freq: Every day | ORAL | Status: DC
  Administered 2012-08-06 – 2012-08-12 (×7): 50 mg via ORAL
  Filled 2012-08-06 (×7): qty 1

## 2012-08-06 MED ORDER — ATORVASTATIN CALCIUM 40 MG PO TABS
40.0000 mg | ORAL_TABLET | Freq: Every evening | ORAL | Status: DC
  Administered 2012-08-06 – 2012-08-11 (×6): 40 mg via ORAL
  Filled 2012-08-06 (×7): qty 1

## 2012-08-06 MED ORDER — WARFARIN SODIUM 7.5 MG PO TABS
7.5000 mg | ORAL_TABLET | ORAL | Status: DC
  Filled 2012-08-06: qty 1

## 2012-08-06 MED ORDER — ALPRAZOLAM 0.5 MG PO TABS
0.5000 mg | ORAL_TABLET | Freq: Four times a day (QID) | ORAL | Status: DC | PRN
  Administered 2012-08-06 – 2012-08-11 (×6): 0.5 mg via ORAL
  Filled 2012-08-06 (×6): qty 1

## 2012-08-06 MED ORDER — SODIUM CHLORIDE 0.9 % IV SOLN
INTRAVENOUS | Status: DC
  Administered 2012-08-06 – 2012-08-11 (×3): via INTRAVENOUS

## 2012-08-06 MED ORDER — ONDANSETRON HCL 4 MG/2ML IJ SOLN: 4.0000 mg | Freq: Four times a day (QID) | INTRAMUSCULAR | Status: DC | PRN

## 2012-08-06 MED ORDER — IPRATROPIUM BROMIDE 0.02 % IN SOLN
0.5000 mg | Freq: Two times a day (BID) | RESPIRATORY_TRACT | Status: DC
  Administered 2012-08-07 – 2012-08-10 (×7): 0.5 mg via RESPIRATORY_TRACT
  Filled 2012-08-06 (×7): qty 2.5

## 2012-08-06 MED ORDER — VANCOMYCIN HCL 10 G IV SOLR
1250.0000 mg | INTRAVENOUS | Status: DC
  Administered 2012-08-06: 1250 mg via INTRAVENOUS
  Filled 2012-08-06 (×2): qty 1250

## 2012-08-06 MED ORDER — AMITRIPTYLINE HCL 75 MG PO TABS
75.0000 mg | ORAL_TABLET | Freq: Every day | ORAL | Status: DC
  Administered 2012-08-06 – 2012-08-11 (×6): 75 mg via ORAL
  Filled 2012-08-06 (×8): qty 1

## 2012-08-06 MED ORDER — DILTIAZEM HCL ER COATED BEADS 240 MG PO CP24
240.0000 mg | ORAL_CAPSULE | Freq: Every day | ORAL | Status: DC
  Administered 2012-08-06: 240 mg via ORAL
  Filled 2012-08-06 (×2): qty 1

## 2012-08-06 MED ORDER — INSULIN ASPART 100 UNIT/ML ~~LOC~~ SOLN
0.0000 [IU] | Freq: Three times a day (TID) | SUBCUTANEOUS | Status: DC
  Administered 2012-08-07: 2 [IU] via SUBCUTANEOUS
  Administered 2012-08-07 – 2012-08-08 (×2): 3 [IU] via SUBCUTANEOUS
  Administered 2012-08-08: 2 [IU] via SUBCUTANEOUS

## 2012-08-06 MED ORDER — LEVOFLOXACIN IN D5W 500 MG/100ML IV SOLN
500.0000 mg | INTRAVENOUS | Status: DC
  Administered 2012-08-06: 500 mg via INTRAVENOUS
  Filled 2012-08-06 (×2): qty 100

## 2012-08-06 MED ORDER — LEVOFLOXACIN IN D5W 750 MG/150ML IV SOLN
500.0000 mg | INTRAVENOUS | Status: DC
  Filled 2012-08-06: qty 150

## 2012-08-06 MED ORDER — WARFARIN - PHARMACIST DOSING INPATIENT
Freq: Every day | Status: DC
  Administered 2012-08-09 – 2012-08-11 (×3)

## 2012-08-06 MED ORDER — ALBUTEROL SULFATE (5 MG/ML) 0.5% IN NEBU: 2.5000 mg | INHALATION_SOLUTION | RESPIRATORY_TRACT | Status: DC | PRN

## 2012-08-06 MED ORDER — ALBUTEROL SULFATE (5 MG/ML) 0.5% IN NEBU
2.5000 mg | INHALATION_SOLUTION | Freq: Two times a day (BID) | RESPIRATORY_TRACT | Status: DC
  Administered 2012-08-07 – 2012-08-10 (×7): 2.5 mg via RESPIRATORY_TRACT
  Filled 2012-08-06 (×7): qty 0.5

## 2012-08-06 MED ORDER — ACETAMINOPHEN 325 MG PO TABS
650.0000 mg | ORAL_TABLET | Freq: Four times a day (QID) | ORAL | Status: DC | PRN
  Administered 2012-08-06 – 2012-08-08 (×3): 650 mg via ORAL
  Filled 2012-08-06 (×4): qty 2

## 2012-08-06 MED ORDER — PANTOPRAZOLE SODIUM 40 MG PO TBEC
40.0000 mg | DELAYED_RELEASE_TABLET | Freq: Every day | ORAL | Status: DC
  Administered 2012-08-06 – 2012-08-12 (×7): 40 mg via ORAL
  Filled 2012-08-06 (×6): qty 1

## 2012-08-06 NOTE — Progress Notes (Signed)
Respiratory protocol done and patient scored a 4. Changed to BID for now and will reassess in 3 days for probable PRn schedule just for SOB/WOB with her past smoking history.

## 2012-08-06 NOTE — Procedures (Signed)
Interventional Radiology Procedure Note  Procedure: Successful placement of Right brachial 39 cm dual lumen PowerPICC, tip in distal SVC and ready for use. Complications: None Recommendations: - Routine line care  Signed,  Sterling Big, MD Vascular & Interventional Radiologist [facility]

## 2012-08-06 NOTE — Progress Notes (Signed)
ANTIBIOTIC CONSULT NOTE - INITIAL  Pharmacy Consult for Vancomycin Indication: rule out pneumonia  Allergies  Allergen Reactions  . Betadine (Povidone Iodine) Itching  . Capoten (Captopril) Other (See Comments)    unknown  . Codone (Hydrocodone) Nausea And Vomiting  . Methadone Nausea And Vomiting  . Penicillins Swelling  . Propoxyphene And Methadone     Intolerance to darvocet    Patient Measurements: Weight = 94 kg  Labs: Scr = 1.6 (07/20/12)   Microbiology: Recent Results (from the past 720 hour(s))  MRSA PCR SCREENING     Status: Normal   Collection Time   07/11/12  2:32 PM      Component Value Range Status Comment   MRSA by PCR NEGATIVE  NEGATIVE Final     Medical History: Past Medical History  Diagnosis Date  . Coronary artery disease     a. Severe two vessel; s/p CABG;  b. 05/2012 NSTEMI in setting of rapid aflutter;  c. 06/2012 Lexiscan cardiolite: small, mild reversible defect in lateral wall->mild ischemia, low-risk->med Rx.  Marland Kitchen Hypertension   . Type II diabetes mellitus   . Hyperlipidemia   . Diabetic neuropathy   . CVA (cerebral vascular accident) 2004  . Hypothyroidism   . Chronic anemia   . GERD (gastroesophageal reflux disease)   . Arthritis   . Memory loss, short term   . COPD (chronic obstructive pulmonary disease)   . Blood transfusion without reported diagnosis   . Chronic combined systolic and diastolic CHF (congestive heart failure)     a. 07/2012 Echo: EF 30%, Gr II DD, Mild AS/MR, Mod-Sev TR, mild bi-atrial and RV dil, PASP .  . Moderate mitral regurgitation     a. mod by TEE 2012, mild by echo 2013, 07/2012  . Severe tricuspid regurgitation     a. Severe by TEE 2012, mod by echo 2013, mod-sev by echo 07/2012  . Chronic vulvitis 07/1993  . Idiopathic anemia 2006  . Atrial fibrillation     a. Failed amiodarone (prolonged QT), not a candidate for class IC meds due to her CAD. No Multaq due to CHF hx;  b. 07/2012 s/p SJM Anthem RF Bi-V PPM,  ser # [id number] and AV node RFCA;  b. chronic coumadin.  . Atrial flutter    Assessment: 79 year old female started on IV Levaquin and vancomycin for suspected pneumonia  CrCl = 30 ml/min (approximately)  Goal of Therapy:  Vancomycin trough level 15-20 mcg/ml  Plan:  1) Vancomycin 1250 mg iv Q 24 hours 2) Follow up cultures, renal function, plan  Thank you. Okey Regal, PharmD [phone]  08/06/2012,3:37 PM

## 2012-08-06 NOTE — H&P (Signed)
Vital Signs  Entered weight:  208  lbs., 8 oz.  Calculated Weight: 208.50 lbs., ( 94.57 kg) Height: 64 in., ( 162.56 cm) Temperature: 100.3 deg F, Temperature site: oral -no tylenol today  Pulse rate: 95 Pulse rhythm: manaul-full minute regular  Blood Pressure #1: 124 / 60 mm Hg    BMI: 35.79 BSA: 1.99 Wt Chg: -5.50 lbs since 06/19/2012  Vitals entered by: Alvie Heidelberg CMA on August 06, 2012 11:55 AM  Pulse Oximetry  O2 Saturation: 92 % Comments: on RA   Other Procedures Completed: CXR       Risk Factors:  Tobacco use:  former smoker Passive smoke exposure:  no Drug use:  no HIV high-risk behavior:  no Caffeine use:  0 drinks per day Alcohol use:  yes    Type:  small glass of wine every few months    Drinks per day:  <1 Exercise:  no Seatbelt use:  100 % Sun Exposure:  occasionally  Colonoscopy History:    Date of Last Colonoscopy:  10/24/2011  Mammogram History:    Date of Last Mammogram:  09/27/2011  History of Present Illness  Reason for visit: See chief complaint Chief Complaint: fever,weakness and cough- i cant get anything up per pt, only taking tylenol at home; none today  also stopped taking abx  History of Present Illness: 5 F here for sick work in [place]  S/P recent PPM  She called in Friday and had Rigors, chills and Fever.  HH relayed some info and Vts = She was very cold. HR 100. Pacemaker is set at 80. 158/62 92%o2 204lbs. She didn't have any pain or sxs. Urine was clear. Lungs sounded clear. Pacemaker site looked clean without redness. She was told to take her to take tylenol and check fever in 4 hours. I told her if it was still up to go to ER. Dr. [place] was also informed. We all Agreed to go to ED if Sxs continue. She was feeling better when we called back and never got to the ED over the wekend.  However she is being seen today with Cough, No SPutum, Weakness, Weight gain from 202 to 206#s, Temps from 97 to 102, some difficulty/labored  breathing and off balance.  She called on call Dr on 2/1 and she had another fever.  Her only symptoms, per her husbad, are "weak" and "runny nose".  Bactrim DS called in. Dr T suggested that she be seen either by Urgent Care or the ER.  The patient refused and Dr T agreed to call in an antibiotic to cover sinus issues, but if her fevers were not better by tomorrow or she felt wrse, that she would go to the ER for evaluation.  B/c of ongoing Sxs she was brought in for labs, sats, CXR for eval and Rx.  She is s/p recent admission for Afib RVR S/P Ablation and Pacer and included aggressive Diuresis due to CHF.  Admit date: 07/11/2012  D/c date: 07/26/2012 ECHO revealed EF only 30% She had ARF and meds were adjusted. On D/c her was BUN 35 and Cr was 1.6 WBC 8.3 on the 22nd and Hbg stabilized at 10.7 After D/c she was making great progress with PT and she did well until she got ill.  Todays CXR was without PNA.  WBC 18  fever,weakness and cough- no sputum, only taking tylenol at home; none today   She also stopped taking abx        Review  of Systems  General:       Complains of fevers, chills, sweats, anorexia, fatigue, malaise.        Denies headache, sleep disturbance.   Eyes:       Denies blurring, diplopia, irritation, discharge, vision loss, eye pain, photophobia.   Ears/Nose/Throat:       Denies earache, ear discharge, tinnitus, decreased hearing, nasal congestion, nosebleeds, sore throat, hoarseness, dysphagia.   Cardiovascular:       Denies chest pains, claudication, palpitations, syncope, dyspnea on exertion, orthopnea, PND, peripheral edema.   Respiratory:       Complains of cough, dyspnea.        Denies excessive sputum, hemoptysis, wheezing.   Gastrointestinal:       Denies nausea, vomiting, diarrhea, constipation, heartburn, change in bowel habits, abdominal pain, melena, hematochezia, jaundice.   Genitourinary:       Denies vaginal discharge, incontinence, dysuria,  hematuria, nocturia, urinary frequency, menorrhagia, abnormal vaginal bleeding, pelvic pain.        [name]  Musculoskeletal:       Complains of stiffness, arthritis.        Denies back pain, joint pain, joint swelling, muscle cramps, muscle weakness.   Skin:       Denies rash, itching, dryness, suspicious lesions.   Neurologic:       Complains of weakness, gait instability.        Denies radicular symptoms, paresthesias, seizures, syncope, tremors, vertigo, dizziness.   Psychiatric:       Denies depression, anxiety, memory loss, mental disturbance, suicidal ideation, hallucinations, paranoia.   Endocrine:       Denies cold intolerance, heat intolerance, polydipsia, polyphagia, polyuria, weight change.   Heme/Lymphatic:       Denies abnormal bruising, bleeding, enlarged lymph nodes.   Allergic/Immunologic:       Denies urticaria, hay fever, persistent infections, HIV exposure.    Past History Past Medical History (reviewed - no changes required): DM2 c Neuropathy/Non-Prolif DM Retinopathy/Cr 1.3-1.8 without microalbuminuria (- Renal US),  CAD/NQWMI/3vCABG 06/2009/ Mild CM c EF 30%  Dr [place].  HTN,  Chronic Anticoagulation for Parox Aflutter (Parox Afib post CABG Surgery) - AFIB c RVR admission 11/2010 and 07/2012 s/p Ablation and BiV Pacer,  Hyperlipidemia,  Vit D def,   GERD, Hypothyroidism,  Chronic Anemia - Multifactorial with elevated ESR/ANR 1:160 Atypical Speckled Pattern/IgG  Lambda Monoclonal Gammopathy of Unknown Significance/B12 Def on Aranesp/Iron/B12 per Dr Arline Asp,  OA esp L Knee per Dr Lequita Halt,  Ventral Hernia,  Diverticulitis.   H/O CVA 10/04.  LE Edema/CVI/venous Stasis, Mild AV Stenosis, Mild MR and TR.  Fibrocystic Breast Bx 09/2009,  Insomnia,  DDD L/S Spine,  Sig Bronchitis with Hemoptysis 09/13/10 (-) CXR, Obesity but weight dropped from 232 prior to CABG to 202 09/2010,  LGIB 10/2011,  R Goin Abscess 05/2012  1. Iron Def Anemia/B12 def of chronic Dz/CKDz dating back to 1/03.  She has received IV iron in 10/04 and 9/08. Remains on oral vitamin B12.  2. 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   3. Angiodysplasia with history of Hemoccult-positive stools.   4. History of elevated ANA 1:160, atypical speckled dating back to November 2005.   5. CAD s/p CABG by Dr. Evelene Croon on 06/11/2009.   6. Tricuspid insufficiency by TEE in 04/2011.   7. History of atrial fibrillation/flutter s/p Ablation and BiV Pacer.   8. Systolic ejection murmur.   9. Renal insufficiency.   10. History of syncope late 04/2011.  11. History of acute right pontine stroke 04/09/2003.   12. Diabetes mellitus.   13. Hypertension.   14. Dyslipidemia.   15. Hypothyroidism.   16. Gastroesophageal reflux disease.   17. Osteoarthritis.   18. Right total knee replacement 12/03/2003.   19. Abdominal hernia. 20. Diverticulosis. 21. Admission for GI bleeding felt to be due to diverticulosis in April 2013. 22. Hiatal hernia. Surgical History (reviewed - no changes required): R Groin Abscess S/P I and D 05/2012 TAH 1979.  Fallopian tube tumor surgery 04/02. R knee arthroscopy 2/03.  R cataract.  L cataract. R tkr 6/05.   T&A as a child.   CAD/3vCABG 06/2009. Breast Bx (-) 2011 Family History (reviewed - no changes required): Father alive CAD/Head & Neck Cancer.  Mother deceased DM,Colon CA.  6 sisters. DM in older sisters. Social History (reviewed - no changes required): Pt is married with 2 children and 2 grandchildren. Spouse Emony Dormer also a pt. 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 No hx of tobacco,alchohol,drugs.   Physical Exam  General appearance: well nourished, well hydrated, no acute distress.  Eyes  External: conjunctivae and lids normal Pupils: equal, round, reactive to light and accommodation  Ears, Nose and Throat  External ears: normal, no lesions or deformities External nose: normal, no lesions or deformities Otoscopic: canals clear, tympanic membranes intact,  no fluid Hearing: grossly intact Nasal: mucosa, septum, and turbinates normal Dental: good dentition Pharynx: tongue normal, protrudes mid line,  posterior pharynx without erythema or exudate  Neck  Neck: supple, no masses, trachea midline Thyroid: no nodules, masses, tenderness, or enlargement  Respiratory  Respiratory effort: no intercostal retractions or use of accessory muscles Auscultation: no rales, rhonchi, or wheezes  Cardiovascular  Palpation: no thrill or palpable murmurs, no displacement of PMI Auscultation: Reg, + murmur, L chest Pacer CDI, No rub or gallop Carotid arteries: pulses 2+, symmetric, no bruits Periph. circulation: no cyanosis, clubbing, trace bil edema  Gastrointestinal  Abdomen: soft, non-tender, no masses, bowel sounds normal Liver and spleen: no enlargement or nodularity  Lymphatic  Neck: no cervical adenopathy  Musculoskeletal  Gait and station: weak and tired Digits and nails: no clubbing, cyanosis, petechiae, or nodes Head and neck: normal alignment and mobility Spine, ribs, pelvis: normal alignment and mobility, no deformity RUE: normal ROM and strength, no joint enlargement or tenderness LUE: normal ROM and strength, no joint enlargement or tenderness RLE: normal ROM and strength, no joint enlargement or tenderness LLE: normal ROM and strength, no joint enlargement or tenderness  Skin  Inspection: warm  Neurologic  Cranial nerves: II - XII grossly intact  Mental Status Exam  Judgment, insight: intact Orientation: oriented to time, place, and person Memory: poor recent recall   Impression & Recommendations:  Problem # 1:  Fever (ICD-780.60) (ICD10-R50.9) her biggest Sx are Fever, chills, rigors, fatigue, weakness, cough, SOB, Anorexia Although CXR here not c/w PNA her WBC count is 18 and no other Focal Sxs noted.  Will treat as if PNA. Of course flu is a possibility. Will admit. She is mildly hypoxic but at 93% no major  issue. Cx. IV Abx UA. F/up CXR. IVF and monitor Cr as Cr here is 2.8 no current skin cellulitis or Septic joints noted. R/Out Flu.  Orders: Chest X-Ray (Pa & L) ([id number]) Venipuncture ([id number]) CMET (SMA 12) ([id number]) CBC/Platelets ([id number])   Problem # 2:  DIABETES MELLITUS, TYPE II, UNCONTROLLED, W/VASCULAR COMPS (ICD-250.72) (ICD10-E11.59) Insulin and SSI. 35 BID lately at home: < 200 15 units Humalog >  200 20 units Humalog Less than 70 0  The following medications were removed from the medication list:    Amaryl 4 Mg Tabs (Glimepiride) .Marland Kitchen... 1/2 po bid  Her updated medication list for this problem includes:    Lantus 100 Unit/ml Soln (Insulin glargine) .Marland KitchenMarland KitchenMarland KitchenMarland Kitchen 35 units bid    Aspirin 81 Mg Ec Tab (Aspirin) .Marland Kitchen... Take one (1) tablet by mouth daily    Humalog 100 Unit/ml Soln (Insulin lispro (human)) ..... She is to do the humalog 15-20 units 3 times per day with meals.  if cbg 100-199 give 15 units and if cbg > 200 give 20 units    Problem # 3:  ANEMIA (ICD-285.9) (ICD10-D64.9) Hbg 10.7 today.  The following medications were removed from the medication list:    B-12 1000 Mcg Caps (Cyanocobalamin) .Marland Kitchen... 1 po qd  Her updated medication list for this problem includes:    Aranesp (albumin Free) 300 Mcg/ml Soln (Darbepoetin alfa-polysorbate) ..... Once monthly   Problem # 4:  ATRIAL FLUTTER (ICD-427.32) (ICD10-I48.92) S/p Ablation and Pacer due to recurrent RVR. She was told one of the wires to R heart may be drawing too much power and may deteriorate the battery quickly. Will consult cards to comment.  Her updated medication list for this problem includes:    Metoprolol Succinate 50 Mg Xr24h-tab (Metoprolol succinate) .Marland Kitchen... Take 1/2 tablet by mouth twice a day    Aspirin 81 Mg Ec Tab (Aspirin) .Marland Kitchen... Take one (1) tablet by mouth daily    Coumadin Tabs (Warfarin sodium tabs) .Marland Kitchen... 1/2 tab sun,m,w,th,sat, 1 tab tues,fri   Problem # 5:  Pacemaker, permanent  (ICD-V45.01) (ICD10-Z95.0) see above  Problem # 6:  HYPERTENSION (ICD-401.9) (ICD10-I10)  Her updated medication list for this problem includes:    Diltiazem Hcl Er 240 Mg Xr24h-cap (Diltiazem hcl) .Marland Kitchen... 1 q am    Metoprolol Succinate 50 Mg Xr24h-tab (Metoprolol succinate) .Marland Kitchen... Take 1/2 tablet by mouth twice a day    Furosemide 20 Mg Tabs (Furosemide) .Marland Kitchen... 1 po bid  BP today: 124/60 BP fine despite being ill and ARF. Prior BP: 112/60 (06/19/2012)  Labs Reviewed: Creat: 1.9 (05/24/2012) Chol: 216 (12/27/2011)   HDL: 41 (10/27/2008)   LDL: 149 (12/27/2011)      Problem # 7:  Acute renal failure (ICD-584.9) (ICD10-N17.9) Will need gentle hydration and following of Cr.  Medications Added to Medication List This Visit: 1)  Diltiazem Hcl Er 240 Mg Xr24h-cap (Diltiazem hcl) .Marland Kitchen.. 1 q am 2)  Klor-con Cr-tabs (Potassium chloride cr-tabs) .... 1/2 tab 3)  Icaps Areds Formula Tabs (Multiple vitamins-minerals) .Marland Kitchen.. 1 qd 4)  Coumadin Tabs (Warfarin sodium tabs) .... 1/2 tab sun,m,w,th,sat, 1 tab tues,fri  Complete Medication List: 1)  Aranesp (albumin Free) 300 Mcg/ml Soln (Darbepoetin alfa-polysorbate) .... Once monthly 2)  Diltiazem Hcl Er 240 Mg Xr24h-cap (Diltiazem hcl) .Marland Kitchen.. 1 q am 3)  Metoprolol Succinate 50 Mg Xr24h-tab (Metoprolol succinate) .... Take 1/2 tablet by mouth twice a day 4)  Klor-con Cr-tabs (Potassium chloride cr-tabs) .... 1/2 tab 5)  Furosemide 20 Mg Tabs (Furosemide) .Marland Kitchen.. 1 po bid 6)  Lantus 100 Unit/ml Soln (Insulin glargine) .... 35 units bid 7)  Aspirin 81 Mg Ec Tab (Aspirin) .... Take one (1) tablet by mouth daily 8)  Nexium 40 Mg Cpdr (Esomeprazole magnesium) .Marland Kitchen.. 1 po qd 9)  Synthroid 100 Mcg Tabs (Levothyroxine sodium) .Marland Kitchen.. 1 po qd 10)  Icaps Areds Formula Tabs (Multiple vitamins-minerals) .Marland Kitchen.. 1 qd 11)  Humalog 100 Unit/ml Soln (  Insulin lispro (human)) .... She is to do the humalog 15-20 units 3 times per day with meals.  if cbg 100-199 give 15 units and if  cbg > 200 give 20 units 12)  Amitriptyline Hcl 25 Mg Tabs (Amitriptyline hcl) .... 3 po qhs 13)  Lipitor 80 Mg Tabs (Atorvastatin calcium) .Marland Kitchen.. 1 po qd 14)  Donepezil Hcl 5 Mg Tbdp (Donepezil hcl) .... One po hs 15)  Coumadin Tabs (Warfarin sodium tabs) .... 1/2 tab sun,m,w,th,sat, 1 tab tues,fri  From recent D/C summary: TAKE these medications   amitriptyline 25 MG tablet    Commonly known as: ELAVIL    Take 75 mg by mouth at bedtime.     aspirin EC 81 MG tablet    Take 81 mg by mouth daily.     atorvastatin 40 MG tablet    Commonly known as: LIPITOR    Take 40 mg by mouth every evening.     diltiazem 240 MG 24 hr capsule    Commonly known as: CARDIZEM CD    Take 1 capsule (240 mg total) by mouth daily.     donepezil 5 MG tablet    Commonly known as: ARICEPT    Take 5 mg by mouth at bedtime.     esomeprazole 40 MG capsule    Commonly known as: NEXIUM    Take 40 mg by mouth daily before breakfast.     FE TABS PO    Take by mouth.     FISH OIL PO    Take by mouth.     furosemide 40 MG tablet    Commonly known as: LASIX    Take 1 tablet (40 mg total) by mouth 2 (two) times daily.     insulin aspart 100 UNIT/ML injection    Commonly known as: novoLOG    Inject 3 Units into the skin 3 (three) times daily with meals.     insulin glargine 100 UNIT/ML injection    Commonly known as: LANTUS    Inject 39 Units into the skin 2 (two) times daily.     insulin lispro 100 UNIT/ML injection    Commonly known as: HUMALOG    Inject 0-15 Units into the skin 3 (three) times daily as needed. Home sliding scale     levothyroxine 100 MCG tablet    Commonly known as: SYNTHROID, LEVOTHROID    Take 100 mcg by mouth daily.     MAGNESIUM PO    Take by mouth.     metoprolol succinate 50 MG 24 hr tablet    Commonly known as: TOPROL-XL    Take 50 mg by mouth daily. Take with or immediately following a meal.     nitroGLYCERIN 0.4 MG SL tablet    Commonly known as: NITROSTAT     Place 0.4 mg under the tongue every 5 (five) minutes as needed. For chest pain     potassium chloride 10 MEQ tablet    Commonly known as: K-DUR,KLOR-CON    Take 1 tablet (10 mEq total) by mouth daily.     Vitamin D 2000 UNITS Caps    Take 2,000 capsules by mouth.     warfarin 5 MG tablet    Commonly known as: COUMADIN    Take 5-7.5 mg by mouth daily. 5mg  Tuesday and Thursday; 7.5mg  the rest of the week     Other Orders: Pulse Ox ([id number])   Comments: ADMIT   Tests: (1) COMPLETE METABOLIC (1010)   GLUCOSE  75 mg/dl                    54-098   BUN                  [H]  45 mg/dl                    1-19   CREATININE           [H]  2.8 mg/dl                   1.4-7.8  eGFR Non-African American                             16.4  eGFR African American                             19.8   SODIUM               [L]  134 mEq/L                   135-148   POTASSIUM                 4.2 mEq/L                   3.5-5.3   CHLORIDE                  99 mEq/L                    80-111   CO2                       20 mEq/L                    15-35   CALCIUM                   9.4 mg/dL                   2.9-56.2   TOTAL PROTEIN             8.3 g/dL                    1.3-0.8   ALBUMIN                   2.6 g/dL                    6.5-7.8   AST                       35 IU/L                     7-45   ALT                       26 IU/L                     5-40   ALK PHOS             [H]  139 IU/L                    37-137   TOTAL BILIRUBIN  0.8 mg/dl                   3.0-8.6  Tests: (2) CBC (2000)   WBC                  [HH] 18.00 K/uL                  4.10-10.90     RES=RESULT VERIFIED AND REPORTED TO PHYSICIAN   LYM                       1.8 K/uL                    0.6-4.1 ! MID                  [H]  4.2 K/uL                    0.0-1.8   GRAN                 [H]  12.0 K/uL                   2.0-7.8   LYM%                 [L]  9.8 %                       10.0-58.5 !  MID%                      23.5 %                      0.1-24.0   GRAN%                     66.7                        37.0-92.0   RBC                  [L]  3.5 M/uL                    4.2-6.3   HGB                  [L]  10.7 g/dL                   57.8-46.9   HCT                  [L]  32.0 %                      37.0-51.0   MCV                       91.1 fL                     80.0-97.0   MCH                       30.5 pg                     26.0-32.0   MCHC  33.4 g/dL                   19.1-47.8   PLT                       269 K/uL                    140-440  Note: An exclamation mark (!) indicates a result that was not dispersed into the flowsheet. Document Creation Date: 08/06/2012 12:50 PM  Electronically signed by [name] MD on 08/06/2012 at 1:07 PM

## 2012-08-06 NOTE — Progress Notes (Addendum)
ANTICOAGULATION CONSULT NOTE - Initial Consult  Pharmacy Consult for Coumadin Indication: atrial fibrillation  Allergies  Allergen Reactions  . Betadine (Povidone Iodine) Itching  . Capoten (Captopril) Other (See Comments)    unknown  . Codone (Hydrocodone) Nausea And Vomiting  . Methadone Nausea And Vomiting  . Penicillins Swelling  . Propoxyphene And Methadone     Intolerance to darvocet    Labs:  [facility] 08/06/12 1621  HGB --  HCT --  PLT --  APTT 67*  LABPROT 40.6*  INR 4.60*  HEPARINUNFRC --  CREATININE --  CKTOTAL --  CKMB --  TROPONINI --    The CrCl is unknown because both a height and weight (above a minimum accepted value) are required for this calculation.  Assessment: 79 year old female on Coumadin PTA for afib who presents today with fever.  Started on IV antibiotics.  Pharmacy to dose her Coumadin  Admit INR is pending  Dose PTA = 5 mg on Tuesday and Friday, 7.5 mg other days  Goal of Therapy:  INR 2-3 Monitor platelets by anticoagulation protocol: Yes   Plan:  1) Follow up admit INR 2) Daily INR  Thank you. Okey Regal, PharmD [phone]  08/06/2012,5:14 PM  Addendum:   Baseline INR on admit was 4.6. No bleeding has been noted. Will hold dose this evening and order daily INR to reasses in am. Severiano Gilbert  08/06/2012 5:14 PM

## 2012-08-07 ENCOUNTER — Telehealth: Payer: Self-pay | Admitting: Cardiology

## 2012-08-07 ENCOUNTER — Inpatient Hospital Stay (HOSPITAL_COMMUNITY): Payer: Medicare Other

## 2012-08-07 DIAGNOSIS — I4891 Unspecified atrial fibrillation: Secondary | ICD-10-CM

## 2012-08-07 DIAGNOSIS — R509 Fever, unspecified: Secondary | ICD-10-CM | POA: Diagnosis present

## 2012-08-07 DIAGNOSIS — I428 Other cardiomyopathies: Secondary | ICD-10-CM

## 2012-08-07 DIAGNOSIS — R531 Weakness: Secondary | ICD-10-CM | POA: Diagnosis present

## 2012-08-07 DIAGNOSIS — J189 Pneumonia, unspecified organism: Secondary | ICD-10-CM | POA: Diagnosis present

## 2012-08-07 LAB — COMPREHENSIVE METABOLIC PANEL
BUN: 43 mg/dL — ABNORMAL HIGH (ref 6–23)
Calcium: 8.2 mg/dL — ABNORMAL LOW (ref 8.4–10.5)
GFR calc Af Amer: 20 mL/min — ABNORMAL LOW (ref >90)
Glucose, Bld: 155 mg/dL — ABNORMAL HIGH (ref 70–99)
Sodium: 135 mEq/L (ref 135–145)
Total Protein: 7 g/dL (ref 6.0–8.3)

## 2012-08-07 LAB — CBC
HCT: 25 % — ABNORMAL LOW (ref 36.0–46.0)
Hemoglobin: 8.2 g/dL — ABNORMAL LOW (ref 12.0–15.0)
RBC: 2.89 MIL/uL — ABNORMAL LOW (ref 3.87–5.11)
WBC: 10.9 10*3/uL — ABNORMAL HIGH (ref 4.0–10.5)

## 2012-08-07 LAB — TYPE AND SCREEN

## 2012-08-07 LAB — PROTIME-INR: INR: 5.76 (ref 0.00–1.49)

## 2012-08-07 LAB — GLUCOSE, CAPILLARY: Glucose-Capillary: 126 mg/dL — ABNORMAL HIGH (ref 70–99)

## 2012-08-07 LAB — URINE CULTURE

## 2012-08-07 MED ORDER — VITAMIN D3 25 MCG (1000 UNIT) PO TABS
1000.0000 [IU] | ORAL_TABLET | Freq: Every day | ORAL | Status: DC
  Administered 2012-08-07 – 2012-08-12 (×6): 1000 [IU] via ORAL
  Filled 2012-08-07 (×7): qty 1

## 2012-08-07 MED ORDER — VANCOMYCIN HCL IN DEXTROSE 1-5 GM/200ML-% IV SOLN
1000.0000 mg | INTRAVENOUS | Status: DC
  Administered 2012-08-07 – 2012-08-08 (×2): 1000 mg via INTRAVENOUS
  Filled 2012-08-07 (×3): qty 200

## 2012-08-07 MED ORDER — LEVOFLOXACIN IN D5W 250 MG/50ML IV SOLN
250.0000 mg | INTRAVENOUS | Status: DC
  Administered 2012-08-07 – 2012-08-11 (×5): 250 mg via INTRAVENOUS
  Filled 2012-08-07 (×6): qty 50

## 2012-08-07 MED ORDER — DILTIAZEM HCL ER COATED BEADS 120 MG PO CP24
120.0000 mg | ORAL_CAPSULE | Freq: Every day | ORAL | Status: DC
  Administered 2012-08-07 – 2012-08-12 (×6): 120 mg via ORAL
  Filled 2012-08-07 (×7): qty 1

## 2012-08-07 NOTE — Progress Notes (Signed)
Subjective: Admitted yesterday with 4 days fever, chills, rigors, weakness, FTT, cough without sputum. W/Up showed WBC 18k Cr 2.8 Flu was (-). INRs 4-5.76 CXR in my office was mostly clear.  This am Pending. No focal Cellulitis and no focal source of infection was noted. Empiric Abx and IVF started. Her Anemia has worsened.  Still feels terrible and weak. Dry cough.   Objective: Vital signs in last 24 hours: Temp:  [98.4 F (36.9 C)-101.7 F (38.7 C)] 98.7 F (37.1 C) (02/04 0455) Pulse Rate:  [81-95] 81  (02/04 0455) Resp:  [18-20] 20  (02/04 0455) BP: (113-153)/(48-60) 113/52 mmHg (02/04 0455) SpO2:  [95 %-100 %] 100 % (02/04 0455) Weight:  [93.895 kg (207 lb)-95.255 kg (210 lb)] 93.895 kg (207 lb) (02/04 0319) Weight change:  Last BM Date: 08/05/12  CBG (last 3)   Basename 08/07/12 0620 08/06/12 2122 08/06/12 1624  GLUCAP 126* 224* 110*    Intake/Output from previous day:  Intake/Output Summary (Last 24 hours) at 08/07/12 0748 Last data filed at 08/07/12 0316  Gross per 24 hour  Intake      0 ml  Output    401 ml  Net   -401 ml   02/03 0701 - 02/04 0700 In: -  Out: 401 [Urine:400; Stool:1]   Physical Exam  General appearance: A and O Eyes: no scleral icterus Throat: oropharynx with mild redness and cervical LNs + Resp: Basilar distant, o/w clear Cardio: Reg/Pacer/ C/D/I GI: soft, non-tender; bowel sounds normal; no masses,  no organomegaly, Obese Extremities: no clubbing, cyanosis, min E noted B Skin much less warm and hot without fever this am.   Lab Results:  [facility] 08/07/12 0445  NA 135  K 3.7  CL 98  CO2 25  GLUCOSE 155*  BUN 43*  CREATININE 2.55*  CALCIUM 8.2*  MG --  PHOS --     Basename 08/07/12 0445  AST 29  ALT 17  ALKPHOS 145*  BILITOT 0.4  PROT 7.0  ALBUMIN 2.2*     Basename 08/07/12 0445  WBC 10.9*  NEUTROABS --  HGB 8.2*  HCT 25.0*  MCV 86.5  PLT 172    Lab Results  Component Value Date   INR 5.76*  08/07/2012   INR 4.60* 08/06/2012   INR 1.9 07/30/2012    No results found for this basename: CKTOTAL:3,CKMB:3,CKMBINDEX:3,TROPONINI:3 in the last 72 hours  No results found for this basename: TSH,T4TOTAL,FREET3,T3FREE,THYROIDAB in the last 72 hours  No results found for this basename: VITAMINB12:2,FOLATE:2,FERRITIN:2,TIBC:2,IRON:2,RETICCTPCT:2 in the last 72 hours  Micro Results: Recent Results (from the past 240 hour(s))  CULTURE, BLOOD (ROUTINE X 2)     Status: Normal (Preliminary result)   Collection Time   08/06/12  4:00 PM      Component Value Range Status Comment   Specimen Description BLOOD LEFT ARM   Final    Special Requests BOTTLES DRAWN AEROBIC ONLY 2CC   Final    Culture  Setup Time 08/06/2012 23:48   Final    Culture     Final    Value:        BLOOD CULTURE RECEIVED NO GROWTH TO DATE CULTURE WILL BE HELD FOR 5 DAYS BEFORE ISSUING A FINAL NEGATIVE REPORT   Report Status PENDING   Incomplete   CULTURE, BLOOD (ROUTINE X 2)     Status: Normal (Preliminary result)   Collection Time   08/06/12  4:10 PM      Component Value Range Status Comment  Specimen Description BLOOD RIGHT HAND   Final    Special Requests BOTTLES DRAWN AEROBIC ONLY 1CC   Final    Culture  Setup Time 08/06/2012 23:48   Final    Culture     Final    Value:        BLOOD CULTURE RECEIVED NO GROWTH TO DATE CULTURE WILL BE HELD FOR 5 DAYS BEFORE ISSUING A FINAL NEGATIVE REPORT   Report Status PENDING   Incomplete      Studies/Results: Ir Fluoro Guide Cv Line Right  08/06/2012  *RADIOLOGY REPORT*  PICC PLACEMENT WITH ULTRASOUND AND FLUOROSCOPIC  GUIDANCE  Clinical History: Very poor intravenous access, requires IV antibiotics.  Fluoroscopy Time: One minutes.  Procedure:  The right arm was prepped with chlorhexidine, draped in the usual sterile fashion using maximum barrier technique (cap and mask, sterile gown, sterile gloves, large sterile sheet, hand hygiene and cutaneous antiseptic).  Local anesthesia was  attained by infiltration with 1% lidocaine.  Ultrasound demonstrated patency of the right brachial vein, and this was documented with an image.  Under real-time ultrasound guidance, this vein was accessed with a 21 gauge micropuncture needle and image documentation was performed.  The needle was exchanged over a guidewire for a peel-away sheath through which a 39 cm 5 [place] dual lumen power injectable PICC was advanced, and positioned with its tip at the lower SVC/right atrial junction. Fluoroscopy during the procedure and fluoro spot radiograph confirms appropriate catheter position.  The catheter was flushed, secured to the skin with Prolene sutures, and covered with a sterile dressing.  Complications:  None.  The patient tolerated the procedure well.  IMPRESSION:  Successful placement of a  right brachial vein dual lumen power PICC with sonographic and fluoroscopic guidance.  The catheter is ready for use.  Signed,  Sterling Big, MD Vascular & Interventional Radiologist [facility]   Original Report Authenticated By: Malachy Moan, M.D.    Ir US Guide Vasc Access Right  08/06/2012  *RADIOLOGY REPORT*  PICC PLACEMENT WITH ULTRASOUND AND FLUOROSCOPIC  GUIDANCE  Clinical History: Very poor intravenous access, requires IV antibiotics.  Fluoroscopy Time: One minutes.  Procedure:  The right arm was prepped with chlorhexidine, draped in the usual sterile fashion using maximum barrier technique (cap and mask, sterile gown, sterile gloves, large sterile sheet, hand hygiene and cutaneous antiseptic).  Local anesthesia was attained by infiltration with 1% lidocaine.  Ultrasound demonstrated patency of the right brachial vein, and this was documented with an image.  Under real-time ultrasound guidance, this vein was accessed with a 21 gauge micropuncture needle and image documentation was performed.  The needle was exchanged over a guidewire for a peel-away sheath through which a 39 cm 5 [place] dual  lumen power injectable PICC was advanced, and positioned with its tip at the lower SVC/right atrial junction. Fluoroscopy during the procedure and fluoro spot radiograph confirms appropriate catheter position.  The catheter was flushed, secured to the skin with Prolene sutures, and covered with a sterile dressing.  Complications:  None.  The patient tolerated the procedure well.  IMPRESSION:  Successful placement of a  right brachial vein dual lumen power PICC with sonographic and fluoroscopic guidance.  The catheter is ready for use.  Signed,  Sterling Big, MD Vascular & Interventional Radiologist [facility]   Original Report Authenticated By: Malachy Moan, M.D.      Medications: Scheduled:    . albuterol  2.5 mg Nebulization BID  . amitriptyline  75  mg Oral QHS  . atorvastatin  40 mg Oral QPM  . cholecalciferol  1,000 Units Oral Daily  . diltiazem  240 mg Oral Daily  . donepezil  5 mg Oral QHS  . insulin aspart  0-15 Units Subcutaneous TID WC  . insulin aspart  0-5 Units Subcutaneous QHS  . insulin glargine  30 Units Subcutaneous BID  . ipratropium  0.5 mg Nebulization BID  . levofloxacin (LEVAQUIN) IV  500 mg Intravenous Q24H  . levothyroxine  100 mcg Oral QAC breakfast  . metoprolol succinate  50 mg Oral Daily  . pantoprazole  40 mg Oral Daily  . vancomycin  1,250 mg Intravenous Q24H  . Warfarin - Pharmacist Dosing Inpatient   Does not apply q1800   Continuous:    . sodium chloride 45 mL/hr at 08/06/12 1930     Assessment/Plan: Active Problems:  Fever  Pneumonia  Weakness  Fever/leukocytosis c presumed PNA due to cough - ?Health Care Acquired - On Borad spectrum Abx.  Flu PCR was (-).  Urine was Dirty.  Await Blood and urine Cxs.  Pacer pocket and prior surgical sites look fine.  Repeat CXR today post hydration.  Consider ECHO to R/Out veg??  Check Sed rate and CRP.  Will Inform Cards of her admission.  Line issues - got Fluor guided PICC yesterday.   FOLLOW LABS  Weakness and FTT - PT/OT/CM.  CAD/History of NQWMI/3vCABG 06/2009 - No current Angina.  Chronic combined systolic and diastolic congestive heart failure c EF 30% (last reduction in EF may have been tachy mediated) - Watch for Vol overload while getting Hydration. Lasix on hold  HTN - BP fine  CKD 3-4 with baseline Cr @ 1.7 - admitted yesterday with Azotemia and Cr 2.8.  Continue Hydration and Cr now 2.5.  Watch for vol overload.   Chronic Anticoagulation AFib.  Now INR > 5 - not bleeding - will let her trend down  Afib S/p Ablation and BiV Pacer - they were told @ a wire issue and we will let Cards comment.  GERD - On PPI  Hypothyroidism - On Synthroid  Multifocal Anemia of Chronic Dz = Iron Def Anemia/B12 def and CKDz on Aranesp/Iron/B12 per Dr Arline Asp IgG lambda monoclonal gammopathy of uncertain significance since 11/04 per Dr Arline Asp  OA S/P TKR and Knee functional and no Effusion  H/O CVA 10/04 - continue ASA on D/c.  LE Edema/CVI/venous Stasis  Obesity - needs weight off  R Goin Abscess 05/2012 - no infection currently  Poorly controlled DM2 c Neuropathy/Non-Prolif DM Retinopathy/nephropathy c Cr 1.3-1.8 without microalbuminuria  - Lantus and SSI - watch for lows with her feeling poor and current CBGs.  Basename 08/07/12 0620 08/06/12 2122 08/06/12 1624  GLUCAP 126* 224* 110*   ID -  Anti-infectives     Start     Dose/Rate Route Frequency Ordered Stop   08/06/12 1600   levofloxacin (LEVAQUIN) IVPB 500 mg  Status:  Discontinued        500 mg 66.7 mL/hr over 90 Minutes Intravenous Every 24 hours 08/06/12 1518 08/06/12 1546   08/06/12 1600   vancomycin (VANCOCIN) 1,250 mg in sodium chloride 0.9 % 250 mL IVPB        1,250 mg 166.7 mL/hr over 90 Minutes Intravenous Every 24 hours 08/06/12 1542     08/06/12 1600   levofloxacin (LEVAQUIN) IVPB 500 mg        500 mg 100 mL/hr over 60 Minutes Intravenous Every 24 hours  08/06/12 1547     08/06/12 1530    vancomycin (VANCOCIN) IVPB 1000 mg/200 mL premix  Status:  Discontinued     Comments: Pharm to help dose.      1,000 mg 200 mL/hr over 60 Minutes Intravenous Every 24 hours 08/06/12 1518 08/06/12 1529         DVT Prophylaxis - on Supra therapeutic coumadin.    LOS: 1 day   Jessup Ogas M 08/07/2012, 7:48 AM

## 2012-08-07 NOTE — Discharge Summary (Signed)
See my note day of discharge.  Plan early op follow up in cardiology

## 2012-08-07 NOTE — Care Management Note (Signed)
    Page 1 of 2   08/10/2012     2:59:52 PM   CARE MANAGEMENT NOTE 08/10/2012  Patient:  [name], [name]   Account Number:  [id number]  Date Initiated:  08/07/2012  Documentation initiated by:  Adriahna Shearman  Subjective/Objective Assessment:   PT ADM WITH FEVER, ? PNA ON 08/06/12.  PTA, PT INDEPENDENT, LIVES WITH SPOUSE.  PAST HX OF HH WITH [facility].  HAS RW AND BSC AT HOME.     Action/Plan:   HUSBAND TO PROVIDE CARE AT DC.  WILL FOLLOW FOR HOME NEEDS AS PT PROGRESSES.  PT/OT EVALS PENDING.   Anticipated DC Date:  08/09/2012   Anticipated DC Plan:  HOME W HOME HEALTH SERVICES      DC Planning Services  CM consult      [facility] Choice  Resumption Of Svcs/PTA Provider   Choice offered to / List presented to:  C-1 Patient        HH arranged  HH-1 RN  HH-2 PT  HH-4 NURSE'S AIDE      HH agency  [facility]   Status of service:  Completed, signed off Medicare Important Message given?   (If response is "NO", the following Medicare IM given date fields will be blank) Date Medicare IM given:   Date Additional Medicare IM given:    Discharge Disposition:  HOME W HOME HEALTH SERVICES  Per UR Regulation:  Reviewed for med. necessity/level of care/duration of stay  If discussed at Long Length of Stay Meetings, dates discussed:    Comments:  08/10/12 Rosalita Chessman [phone] RESUMPTION OF HOME CARE ORDERS RECEIVED.  NOTIFIED [facility] OF ORDERS.  08/07/12 Torsha Lemus,RN,BSN [phone] INFORMED BY [facility] THAT PT ACTUALLY IS ACTIVE WITH [facility] FOR [facility] CARE.  WILL NEED ORDERS FOR RESUMPTION OF CARE PRIOR TO DC. WILL FOLLOW.

## 2012-08-07 NOTE — Consult Note (Signed)
ELECTROPHYSIOLOGY CONSULT NOTE    Patient ID: [name] MRN: [id number], DOB/AGE: April 11, 1933 79 y.o.  Admit date: 08/06/2012 Date of Consult: 08-07-12  Primary Physician: [name], MD Primary Cardiologist: Peter Swaziland, MD Electrophysiologist: Lewayne Bunting, MD  Reason for Consultation: fevers s/p CRTP implant  HPI:  [name] is a 79 year old female whom EP has been asked to see for fevers with recent CRT P implant.  [name] was admitted to [facility] on 1/8 secondary to rapid afib and acute on chronic combined chf. During her hospitalization, [name] briefly required bi-pap and diuresed 17 lbs. Echo showed EF of 30%, in the setting of afib - it was felt that that may be tachy mediated. Because of multiple co-morbidities, [name] was not felt to be a candidate for a number of antiarrhythmics and placement of a bi-ventricular pacemaker followed by av nodal ablation was recommended. These procedures were carried out on 1/15 and 1/16 respectively. [name] was eventually d/c'd on 1/23. Since d/c, [name] has done very well. [name] reports a marked increase in exercise tolerance and decrease in shortness of breath.  [name] states after discharge, [name] felt better than [name] had in a long time.  Over the past several days, [name] has developed fevers, chills, weakness, and non productive cough.  [name] presented to the hospital on 08-06-2012 and was admitted for presumed PNA.  [name] was placed on IV Levaquin and Vancomycin.  [name] states [name] is feeling better than yesterday, but still very weak.    Device interrogation today demonstrates sinus rhythm with CRT pacing.  Her LV threshold is increased since implant, but stable from wound check. [name] has an underlying sinus rate in the 70's today.  Base rate was decreased from 80 to 70 to decrease liklihood of competitive atrial pacing.  Best LV threshold unipolar- 2.25V (bipolar 2.75V, Ring 5.5V, tip 3.75V).  CXR demonstrates stable position of leads.  Her device site is well healed.     PICC line was placed in right arm yesterday due to IV access issues.     Past Medical History  Diagnosis Date  . Coronary artery disease     a. Severe two vessel; s/p CABG;  b. 05/2012 NSTEMI in setting of rapid aflutter;  c. 06/2012 Lexiscan cardiolite: small, mild reversible defect in lateral wall->mild ischemia, low-risk->med Rx.  Marland Kitchen Hypertension   . Type II diabetes mellitus   . Hyperlipidemia   . Diabetic neuropathy   . CVA (cerebral vascular accident) 2004  . Hypothyroidism   . Chronic anemia   . GERD (gastroesophageal reflux disease)   . Arthritis   . Memory loss, short term   . COPD (chronic obstructive pulmonary disease)   . Blood transfusion without reported diagnosis   . Chronic combined systolic and diastolic CHF (congestive heart failure)     a. 07/2012 Echo: EF 30%, Gr II DD, Mild AS/MR, Mod-Sev TR, mild bi-atrial and RV dil, PASP .  . Moderate mitral regurgitation     a. mod by TEE 2012, mild by echo 2013, 07/2012  . Severe tricuspid regurgitation     a. Severe by TEE 2012, mod by echo 2013, mod-sev by echo 07/2012  . Chronic vulvitis 07/1993  . Idiopathic anemia 2006  . Atrial fibrillation     a. Failed amiodarone (prolonged QT), not a candidate for class IC meds due to her CAD. No Multaq due to CHF hx;  b. 07/2012 s/p SJM Anthem RF Bi-V PPM, ser # [id number] and AV  node RFCA;  b. chronic coumadin.  . Atrial flutter      Surgical History:  Past Surgical History  Procedure Date  . Coronary artery bypass graft 06/2009    X3, LIMA to LAD, SVG to diagonal, SVG to the posterior descending artery.  . Tonsillectomy   . Knee arthroscopy 08/2001  . Transthoracic echocardiogram 11/12/2010     Ejection fraction felt to be around 50%.  Wall thickness was increased in a pattern of mild LVH.  Moderately dilated left atrium.  Mildly dilated right atrium. Right ventricle was mildly dilated with mildly reduced systolic function  . Cardiac catheterization 06/09/2009    inferior  wall hypokinesia with ejection fraction of 55%.  . Abdominal hysterectomy   . Joint replacement   . Tee without cardioversion 05/23/2011    Procedure: TRANSESOPHAGEAL ECHOCARDIOGRAM (TEE);  Surgeon: Lewayne Bunting, MD;  Location: [facility] ENDOSCOPY;  Service: Cardiovascular;  Laterality: N/A;  . Incision and drainage abscess 05/07/2012    Procedure: INCISION AND DRAINAGE ABSCESS;  Surgeon: Ardeth Sportsman, MD;  Location: [facility] OR;  Service: General;  Laterality: N/A;  Groin wound  . Total abdominal hysterectomy 1979  . Breast biopsy Left  . Bilateral salpingectomy 10/2000  . Breast biopsy 09/18/2009    fibrocystic change  . Coronary artery bypass graft 06/2009     Prescriptions prior to admission  Medication Sig Dispense Refill  . ACETAMINOPHEN PO Take 2 tablets by mouth every 4 (four) hours as needed. For pain      . amitriptyline (ELAVIL) 25 MG tablet Take 75 mg by mouth at bedtime.        Marland Kitchen aspirin EC 81 MG tablet Take 81 mg by mouth daily.      Marland Kitchen atorvastatin (LIPITOR) 40 MG tablet Take 40 mg by mouth every evening.       . diltiazem (CARDIZEM CD) 240 MG 24 hr capsule Take 1 capsule (240 mg total) by mouth daily.  30 capsule  6  . donepezil (ARICEPT) 5 MG tablet Take 5 mg by mouth at bedtime.      Marland Kitchen esomeprazole (NEXIUM) 40 MG capsule Take 40 mg by mouth daily before breakfast.        . furosemide (LASIX) 40 MG tablet Take 20-40 mg by mouth 2 (two) times daily. 40 mg in the am and 20 mg evening      . insulin glargine (LANTUS) 100 UNIT/ML injection Inject 35 Units into the skin 2 (two) times daily.      . insulin lispro (HUMALOG) 100 UNIT/ML injection Inject 0-20 Units into the skin 3 (three) times daily as needed. Home sliding scale If blood sugar is <200 take 15 units If blood sugar is >200 take 20 units If blood sugar is less than 70, don't take any      . levothyroxine (SYNTHROID, LEVOTHROID) 100 MCG tablet Take 100 mcg by mouth daily.      . metoprolol succinate (TOPROL-XL) 50 MG 24 hr  tablet Take 50 mg by mouth daily. Take with or immediately following a meal.      . Multiple Vitamins-Minerals (ICAPS AREDS FORMULA PO) Take 1 tablet by mouth daily.      . potassium chloride SA (K-DUR,KLOR-CON) 20 MEQ tablet Take 10 mEq by mouth daily.      Marland Kitchen warfarin (COUMADIN) 5 MG tablet Take 5-7.5 mg by mouth See admin instructions. Pt takes 1.5 (7.5 mg)  tablet on Sunday, Monday, Wednesday, Thursday and Saturday takes 1 tablet (5 mg)  on Tuesday and friday      . nitroGLYCERIN (NITROSTAT) 0.4 MG SL tablet Place 0.4 mg under the tongue every 5 (five) minutes as needed. For chest pain      . potassium chloride SA (K-DUR,KLOR-CON) 10 MEQ tablet Take 1 tablet (10 mEq total) by mouth daily.  30 tablet  6    Inpatient Medications:    . albuterol  2.5 mg Nebulization BID  . amitriptyline  75 mg Oral QHS  . atorvastatin  40 mg Oral QPM  . cholecalciferol  1,000 Units Oral Daily  . diltiazem  120 mg Oral Daily  . donepezil  5 mg Oral QHS  . insulin aspart  0-15 Units Subcutaneous TID WC  . insulin aspart  0-5 Units Subcutaneous QHS  . insulin glargine  30 Units Subcutaneous BID  . ipratropium  0.5 mg Nebulization BID  . levofloxacin (LEVAQUIN) IV  250 mg Intravenous Q24H  . levothyroxine  100 mcg Oral QAC breakfast  . metoprolol succinate  50 mg Oral Daily  . pantoprazole  40 mg Oral Daily  . vancomycin  1,000 mg Intravenous Q24H  . Warfarin - Pharmacist Dosing Inpatient   Does not apply q1800    Allergies:  Allergies  Allergen Reactions  . Betadine (Povidone Iodine) Itching  . Capoten (Captopril) Other (See Comments)    unknown  . Codone (Hydrocodone) Nausea And Vomiting  . Methadone Nausea And Vomiting  . Penicillins Swelling  . Propoxyphene And Methadone     Intolerance to darvocet    History   Social History  . Marital Status: Married    Spouse Name: N/A    Number of Children: 2  . Years of Education: N/A   Occupational History  . teacher    Social History Main  Topics  . Smoking status: Former Games developer  . Smokeless tobacco: Former Neurosurgeon    Quit date: 07/04/1972  . Alcohol Use: No  . Drug Use: No  . Sexually Active: No   Other Topics Concern  . Not on file   Social History Narrative   Lives in [place] with spouse     Family History  Problem Relation Age of Onset  . Colon cancer Mother   . Stroke Mother   . Hypertension Mother   . Cancer Mother     breast  . Cancer Father     colon  . Heart attack Father     x2    Physical Exam: Filed Vitals:   08/07/12 0455 08/07/12 0956 08/07/12 1308 08/07/12 1954  BP: 113/52 135/65 125/54   Pulse: 81 81 79 78  Temp: 98.7 F (37.1 C)  98.1 F (36.7 C)   TempSrc: Oral  Oral   Resp: 20  21 20   Height:      Weight:      SpO2: 100%  95% 97%    GEN- The patient is overweight appearing, alert and oriented x 3 today.   Head- normocephalic, atraumatic Eyes-  Sclera clear, conjunctiva pink Ears- hearing intact Oropharynx- clear Neck- supple, + JVD  Lungs- Clear to ausculation bilaterally, normal work of breathing Chest- pacemaker pocket is healing nicely Heart- Regular rate and rhythm, 3/6 SEM LLSB GI- soft, NT, ND, + BS Extremities- no clubbing, cyanosis, + edema MS- no significant deformity or atrophy Skin- no rash or lesion Psych- euthymic mood, full affect Neuro- strength and sensation are intact   Labs:   Lab Results  Component Value Date   WBC 10.9* 08/07/2012  HGB 8.2* 08/07/2012   HCT 25.0* 08/07/2012   MCV 86.5 08/07/2012   PLT 172 08/07/2012    Lab 08/07/12 0445  NA 135  K 3.7  CL 98  CO2 25  BUN 43*  CREATININE 2.55*  CALCIUM 8.2*  PROT 7.0  BILITOT 0.4  ALKPHOS 145*  ALT 17  AST 29  GLUCOSE 155*     Radiology/Studies: X-ray Chest Pa And Lateral  08/07/2012  *RADIOLOGY REPORT*  Clinical Data: Status post pacemaker insertion.  Cough and cold symptoms.  CHEST - 2 VIEW  Comparison: 07/30/2012  Findings: Right lung base opacity has increased from the prior study partly  silhouetting the right hemidiaphragm, consistent with pneumonia in the proper clinical setting.  The lungs are otherwise clear.  The cardiac silhouette is mildly enlarged.  Changes from previous cardiac surgery are stable.  The left anterior chest wall sequential pacemaker is also stable with its leads projecting within the right atrium and right ventricle.  IMPRESSION: Increased right lung base opacity consistent with an evolving infiltrate.   Original Report Authenticated By: Amie Portland, M.D.    EKG: ordered  TELEMETRY: AV pacing  ECHO: ordered  A/P 1. Fevers Clinical picture is likely pneumonia.  Her pacemaker site looks good.  Would not worry about device infection at this point unless blood cultures reveal bacteremia.   2. Afib Stable s/p AV nodal ablation Continue coumadin  3. AV block Doing well s/p AVnodal ablation and biv pacemaker Lead position is good. Interrogation today is adequate.  LV lead threshold is slightly elevated but still within an acceptable range.  V pacing rate decreased to 70 bpm.  No other changes at this time.  4. Nonischeimc CM Stable JVP is elevated though in the setting of severe TR this is difficult to assess.  Would keep Is and Os even.  Will see as needed Call with questions  Fayrene Fearing Gardenia Witter,MD 8:49 PM 08/07/2012

## 2012-08-07 NOTE — Telephone Encounter (Signed)
Spoke with pt husband, he has a lot of questions regarding the pts current hosp and her leads. Sent a message to [employer], she saw the pt today.

## 2012-08-07 NOTE — Evaluation (Signed)
Physical Therapy Evaluation Patient Details Name: HANNALEE CASTOR MRN: [id number] DOB: [birth year] Today's Date: 08/07/2012 Time: 7829-5621 PT Time Calculation (min): 28 min  PT Assessment / Plan / Recommendation Clinical Impression  pt admitted with fever, weakness and cough.  Working dx is PNA.  On eval , pt is significantly weak with litle tolerance for activity and with gait instabilily.  Pt can benefit from Pt to maximize I    PT Assessment  Patient needs continued PT services    Follow Up Recommendations  Home health PT;Supervision for mobility/OOB    Does the patient have the potential to tolerate intense rehabilitation      Barriers to Discharge None      Equipment Recommendations  None recommended by PT    Recommendations for Other Services     Frequency Min 3X/week    Precautions / Restrictions Precautions Precautions: Fall Restrictions Weight Bearing Restrictions: No   Pertinent Vitals/Pain       Mobility  Bed Mobility Bed Mobility: Not assessed Transfers Transfers: Sit to Stand;Stand to Sit Sit to Stand: 3: Mod assist;With upper extremity assist;From chair/3-in-1;Other (comment) (pt =70%) Stand to Sit: 4: Min assist;With upper extremity assist;To chair/3-in-1 Details for Transfer Assistance: verbal cues for safe technique and hand placement; lifting assist and help controlling descent Ambulation/Gait Ambulation/Gait Assistance: 4: Min guard Ambulation Distance (Feet): 80 Feet Assistive device: Other (Comment);1 person hand held assist (iv pole) Ambulation/Gait Assistance Details: gait notably guarded and moderately unsteady Gait Pattern: Step-through pattern;Decreased step length - right;Decreased step length - left;Decreased stride length;Wide base of support Gait velocity: decreased Stairs: No    Exercises     PT Diagnosis: Difficulty walking;Generalized weakness  PT Problem List: Decreased strength;Decreased activity tolerance;Decreased  balance;Decreased coordination;Cardiopulmonary status limiting activity PT Treatment Interventions: DME instruction;Gait training;Stair training;Functional mobility training;Therapeutic activities;Balance training;Patient/family education   PT Goals Acute Rehab PT Goals PT Goal Formulation: With patient Time For Goal Achievement: 08/21/12 Potential to Achieve Goals: Good Pt will go Supine/Side to Sit: with supervision PT Goal: Supine/Side to Sit - Progress: Goal set today PT Goal: Sit to Supine/Side - Progress: Goal set today Pt will go Sit to Stand: with supervision PT Goal: Sit to Stand - Progress: Goal set today Pt will go Stand to Sit: with supervision PT Goal: Stand to Sit - Progress: Goal set today Pt will Ambulate: >150 feet;with supervision;with least restrictive assistive device PT Goal: Ambulate - Progress: Goal set today Pt will Go Up / Down Stairs: 1-2 stairs;with min assist;with rail(s) PT Goal: Up/Down Stairs - Progress: Goal set today  Visit Information  Last PT Received On: 08/07/12 Assistance Needed: +1    Subjective Data  Subjective: I've never had anything take this much out of me, I'm so weak. Patient Stated Goal: Back home Independent and walking without walker   Prior Functioning  Home Living Lives With: Spouse;Son Available Help at Discharge: Family;Available 24 hours/day Type of Home: House Home Access: Stairs to enter [employer] of Steps: 2 Entrance Stairs-Rails: Right;Left;Can reach both Home Layout: One level Bathroom Shower/Tub: Health visitor: Standard Bathroom Accessibility: Yes How Accessible: Accessible via walker Home Adaptive Equipment: Grab bars around toilet;Straight cane;Walker - rolling;Bedside commode/3-in-1;Built-in shower seat;Grab bars in shower Prior Function Level of Independence: Independent Able to Take Stairs?: Yes Driving: No Vocation: Retired Musician: No  difficulties Dominant Hand: Right    Cognition  Cognition Overall Cognitive Status: Appears within functional limits for tasks assessed/performed Arousal/Alertness: Awake/alert Orientation Level: Appears intact for tasks  assessed Behavior During Session: [facility] for tasks performed    Extremity/Trunk Assessment Right Upper Extremity Assessment RUE ROM/Strength/Tone: Within functional levels Left Upper Extremity Assessment LUE ROM/Strength/Tone: WFL for tasks assessed;Deficits LUE ROM/Strength/Tone Deficits: bil. weakness, but L >R Right Lower Extremity Assessment RLE ROM/Strength/Tone: WFL for tasks assessed;Deficits RLE ROM/Strength/Tone Deficits: bil gross weakness <=4/5 proximal weaker than distal RLE Sensation: History of peripheral neuropathy Left Lower Extremity Assessment LLE ROM/Strength/Tone: WFL for tasks assessed;Deficits LLE ROM/Strength/Tone Deficits: see R LE LLE Sensation: History of peripheral neuropathy Trunk Assessment Trunk Assessment: Normal   Balance Balance Balance Assessed: No  End of Session PT - End of Session Equipment Utilized During Treatment: Gait belt Activity Tolerance: Patient limited by fatigue;Patient tolerated treatment well Patient left: in chair;with call bell/phone within reach Nurse Communication: Mobility status  GP     Glennie Bose, Eliseo Gum 08/07/2012, 5:13 PM  08/07/2012  [place], PT [phone] (pager)

## 2012-08-07 NOTE — Telephone Encounter (Signed)
New Problem     Pt's husband would like to notify Dr. Swaziland that [name] is currently in the hospital. Diagnosis currently unknown.

## 2012-08-07 NOTE — Progress Notes (Signed)
Pharmacy Consult - Vancomycin, Coumadin  Admitted 2/3 with fever, chills, rigors, weakness Scr today = 2.55 Currently afebrile, cultures with NGTD On Coumadin PTA for Afib, INR is supra-therapeutic  Plan: 1) No Coumadin today 2) Decrease Vancomycin to 1 Gram IV Q 24 (renally adjust) 3) Decrease Levaquin to 250 mg IV Q 24 (renally adjuts) 4) Continue to follow  Thank you. Okey Regal, PharmD [phone]

## 2012-08-07 NOTE — Progress Notes (Signed)
CRITICAL VALUE ALERT  Critical value received: PT 47.9, INR 5.76  Date of notification: 08/07/12  Time of notification: 0530  Critical value read back: yes  Nurse who received alert: Alfonso Ellis, RN  MD notified (1st page): Dr. Felipa Eth  Time of first page: 0530  MD notified (2nd page):  Time of second page:  Responding MD: Dr. Felipa Eth  Time MD responded: [phone]

## 2012-08-08 DIAGNOSIS — I059 Rheumatic mitral valve disease, unspecified: Secondary | ICD-10-CM

## 2012-08-08 LAB — BASIC METABOLIC PANEL
Calcium: 8.4 mg/dL (ref 8.4–10.5)
GFR calc non Af Amer: 18 mL/min — ABNORMAL LOW (ref >90)
Glucose, Bld: 90 mg/dL (ref 70–99)
Sodium: 132 mEq/L — ABNORMAL LOW (ref 135–145)

## 2012-08-08 LAB — CBC
MCH: 28.1 pg (ref 26.0–34.0)
Platelets: 188 10*3/uL (ref 150–400)
RBC: 2.78 MIL/uL — ABNORMAL LOW (ref 3.87–5.11)
WBC: 8.4 10*3/uL (ref 4.0–10.5)

## 2012-08-08 LAB — PROTIME-INR
INR: 6.5 (ref 0.00–1.49)
Prothrombin Time: 52.4 seconds — ABNORMAL HIGH (ref 11.6–15.2)

## 2012-08-08 LAB — GLUCOSE, CAPILLARY
Glucose-Capillary: 112 mg/dL — ABNORMAL HIGH (ref 70–99)
Glucose-Capillary: 156 mg/dL — ABNORMAL HIGH (ref 70–99)
Glucose-Capillary: 105 mg/dL — ABNORMAL HIGH (ref 70–99)

## 2012-08-08 LAB — PRO B NATRIURETIC PEPTIDE: Pro B Natriuretic peptide (BNP): 4623 pg/mL — ABNORMAL HIGH (ref 0–450)

## 2012-08-08 MED ORDER — INSULIN GLARGINE 100 UNIT/ML ~~LOC~~ SOLN
20.0000 [IU] | Freq: Two times a day (BID) | SUBCUTANEOUS | Status: DC
  Administered 2012-08-08 (×2): 20 [IU] via SUBCUTANEOUS

## 2012-08-08 MED ORDER — GUAIFENESIN-DM 100-10 MG/5ML PO SYRP
5.0000 mL | ORAL_SOLUTION | ORAL | Status: DC | PRN
  Administered 2012-08-08 – 2012-08-10 (×5): 5 mL via ORAL
  Filled 2012-08-08 (×6): qty 5

## 2012-08-08 MED ORDER — PHYTONADIONE 5 MG PO TABS
2.5000 mg | ORAL_TABLET | Freq: Once | ORAL | Status: AC
  Administered 2012-08-08: 2.5 mg via ORAL
  Filled 2012-08-08 (×2): qty 1

## 2012-08-08 NOTE — Progress Notes (Signed)
OT Cancellation Note  Patient Details Name: [name] MRN: [id number] DOB: 04/21/1933   Cancelled Treatment:    Reason Eval/Treat Not Completed: Medical issues which prohibited therapy (INR 6.5. Will assess tomorrow.)  [facility] Pauline Trainer, OTR/L  [phone] 08/08/2012 08/08/2012, 11:42 AM

## 2012-08-08 NOTE — Progress Notes (Signed)
  Echocardiogram 2D Echocardiogram has been performed.  [name] 08/08/2012, 3:09 PM

## 2012-08-08 NOTE — Progress Notes (Signed)
Hypoglycemic Event  CBG: 68  Treatment: 15 GM carbohydrate snack  Symptoms: None  Follow-up CBG: Time:06:42 CBG Result:112  Possible Reasons for Event: Unknown  Comments/MD notified: DR Lenna Sciara  Remember to initiate Hypoglycemia Order Set & complete

## 2012-08-08 NOTE — Progress Notes (Signed)
[facility]  Patient Status: Active (receiving services up to time of hospitalization)  [facility] is providing the following services: RN  If patient discharges after hours, please call [phone].   Jodene Nam 08/08/2012, 12:29 PM

## 2012-08-08 NOTE — Progress Notes (Signed)
CRITICAL VALUE ALERT  Critical value received:  Blood culture Positive for Gram + cocci clusters  Date of notification:  08/08/2012  Time of notification:  1300  Critical value read back:yes  Nurse who received alert:  Jeremy Johann  MD notified (1st page):  Link Snuffer, MD  Time MD paged: [phone]  Time MD responded: [phone]

## 2012-08-08 NOTE — Progress Notes (Addendum)
Critical Lab value received of INR 6.5. MD paged. No new orders received. Will continue to monitor pt closesly. EDIT: order received to start PO Vitamin K. Will continue to monitor pt.

## 2012-08-08 NOTE — Progress Notes (Signed)
Subjective: Admitted with 4 days fever, chills, rigors, weakness, FTT, cough without sputum. CXR yest did show PNA Still feels terrible and weak. Dry cough. Her pacer was interrogated yesterday Feeling a little better. Low CBG this am.  Objective: Vital signs in last 24 hours: Temp:  [98.1 F (36.7 C)-100.3 F (37.9 C)] 99.1 F (37.3 C) (02/05 0556) Pulse Rate:  [54-81] 78  (02/05 0501) Resp:  [18-21] 20  (02/05 0501) BP: (107-135)/(35-65) 118/46 mmHg (02/05 0501) SpO2:  [95 %-100 %] 95 % (02/05 0501) Weight:  [98.249 kg (216 lb 9.6 oz)] 98.249 kg (216 lb 9.6 oz) (02/05 0501) Weight change: 2.994 kg (6 lb 9.6 oz) Last BM Date: 08/05/12  CBG (last 3)   Basename 08/08/12 0642 08/08/12 0602 08/07/12 2119  GLUCAP 112* 63* 176*    Intake/Output from previous day:  Intake/Output Summary (Last 24 hours) at 08/08/12 0720 Last data filed at 08/08/12 0618  Gross per 24 hour  Intake   2046 ml  Output   1900 ml  Net    146 ml   02/04 0701 - 02/05 0700 In: 2046 [P.O.:480; I.V.:1566] Out: 1900 [Urine:1900]   Physical Exam  General appearance: A and O. Falls back to sleep quickly Throat: oropharynx with mild redness and cervical LNs + Resp: Basilar distant, o/w clear Cardio: Reg/Pacer/ C/D/I GI: soft, non-tender; bowel sounds normal; no masses,  no organomegaly, Obese Extremities: no clubbing, cyanosis, min E noted B Skin less warm and hot without fever this am.   Lab Results:  [facility] 08/07/12 0445  NA 135  K 3.7  CL 98  CO2 25  GLUCOSE 155*  BUN 43*  CREATININE 2.55*  CALCIUM 8.2*  MG --  PHOS --     Basename 08/07/12 0445  AST 29  ALT 17  ALKPHOS 145*  BILITOT 0.4  PROT 7.0  ALBUMIN 2.2*     Basename 08/07/12 0445  WBC 10.9*  NEUTROABS --  HGB 8.2*  HCT 25.0*  MCV 86.5  PLT 172    Lab Results  Component Value Date   INR 5.76* 08/07/2012   INR 4.60* 08/06/2012   INR 1.9 07/30/2012    No results found for this basename:  CKTOTAL:3,CKMB:3,CKMBINDEX:3,TROPONINI:3 in the last 72 hours  No results found for this basename: TSH,T4TOTAL,FREET3,T3FREE,THYROIDAB in the last 72 hours  No results found for this basename: VITAMINB12:2,FOLATE:2,FERRITIN:2,TIBC:2,IRON:2,RETICCTPCT:2 in the last 72 hours  Micro Results: Recent Results (from the past 240 hour(s))  CULTURE, BLOOD (ROUTINE X 2)     Status: Normal (Preliminary result)   Collection Time   08/06/12  4:00 PM      Component Value Range Status Comment   Specimen Description BLOOD LEFT ARM   Final    Special Requests BOTTLES DRAWN AEROBIC ONLY 2CC   Final    Culture  Setup Time 08/06/2012 23:48   Final    Culture     Final    Value:        BLOOD CULTURE RECEIVED NO GROWTH TO DATE CULTURE WILL BE HELD FOR 5 DAYS BEFORE ISSUING A FINAL NEGATIVE REPORT   Report Status PENDING   Incomplete   CULTURE, BLOOD (ROUTINE X 2)     Status: Normal (Preliminary result)   Collection Time   08/06/12  4:10 PM      Component Value Range Status Comment   Specimen Description BLOOD RIGHT HAND   Final    Special Requests BOTTLES DRAWN AEROBIC ONLY 1CC   Final  Culture  Setup Time 08/06/2012 23:48   Final    Culture     Final    Value: GRAM NEGATIVE RODS     Note: Gram Stain Report Called to,Read Back By and Verified With: [facility] 08/08/2012 5:32AM YIMSU   Report Status PENDING   Incomplete   URINE CULTURE     Status: Normal   Collection Time   08/06/12  7:47 PM      Component Value Range Status Comment   Specimen Description URINE, CLEAN CATCH   Final    Special Requests NONE   Final    Culture  Setup Time 08/06/2012 20:31   Final    Colony Count NO GROWTH   Final    Culture NO GROWTH   Final    Report Status 08/07/2012 FINAL   Final      Studies/Results: X-ray Chest Pa And Lateral   08/07/2012  *RADIOLOGY REPORT*  Clinical Data: Status post pacemaker insertion.  Cough and cold symptoms.  CHEST - 2 VIEW  Comparison: 07/30/2012  Findings: Right lung base opacity has  increased from the prior study partly silhouetting the right hemidiaphragm, consistent with pneumonia in the proper clinical setting.  The lungs are otherwise clear.  The cardiac silhouette is mildly enlarged.  Changes from previous cardiac surgery are stable.  The left anterior chest wall sequential pacemaker is also stable with its leads projecting within the right atrium and right ventricle.  IMPRESSION: Increased right lung base opacity consistent with an evolving infiltrate.   Original Report Authenticated By: Amie Portland, Alexander.D.    Ir Fluoro Guide Cv Line Right  08/06/2012  *RADIOLOGY REPORT*  PICC PLACEMENT WITH ULTRASOUND AND FLUOROSCOPIC  GUIDANCE  Clinical History: Very poor intravenous access, requires IV antibiotics.  Fluoroscopy Time: One minutes.  Procedure:  The right arm was prepped with chlorhexidine, draped in the usual sterile fashion using maximum barrier technique (cap and mask, sterile gown, sterile gloves, large sterile sheet, hand hygiene and cutaneous antiseptic).  Local anesthesia was attained by infiltration with 1% lidocaine.  Ultrasound demonstrated patency of the right brachial vein, and this was documented with an image.  Under real-time ultrasound guidance, this vein was accessed with a 21 gauge micropuncture needle and image documentation was performed.  The needle was exchanged over a guidewire for a peel-away sheath through which a 39 cm 5 [place] dual lumen power injectable PICC was advanced, and positioned with its tip at the lower SVC/right atrial junction. Fluoroscopy during the procedure and fluoro spot radiograph confirms appropriate catheter position.  The catheter was flushed, secured to the skin with Prolene sutures, and covered with a sterile dressing.  Complications:  None.  The patient tolerated the procedure well.  IMPRESSION:  Successful placement of a  right brachial vein dual lumen power PICC with sonographic and fluoroscopic guidance.  The catheter is ready for  use.  Signed,  Sterling Big, MD Vascular & Interventional Radiologist [facility] Radiology   Original Report Authenticated By: Malachy Moan, Alexander.D.    Ir US Guide Vasc Access Right  08/06/2012  *RADIOLOGY REPORT*  PICC PLACEMENT WITH ULTRASOUND AND FLUOROSCOPIC  GUIDANCE  Clinical History: Very poor intravenous access, requires IV antibiotics.  Fluoroscopy Time: One minutes.  Procedure:  The right arm was prepped with chlorhexidine, draped in the usual sterile fashion using maximum barrier technique (cap and mask, sterile gown, sterile gloves, large sterile sheet, hand hygiene and cutaneous antiseptic).  Local anesthesia was attained by infiltration with 1% lidocaine.  Ultrasound demonstrated patency of the right brachial vein, and this was documented with an image.  Under real-time ultrasound guidance, this vein was accessed with a 21 gauge micropuncture needle and image documentation was performed.  The needle was exchanged over a guidewire for a peel-away sheath through which a 39 cm 5 [place] dual lumen power injectable PICC was advanced, and positioned with its tip at the lower SVC/right atrial junction. Fluoroscopy during the procedure and fluoro spot radiograph confirms appropriate catheter position.  The catheter was flushed, secured to the skin with Prolene sutures, and covered with a sterile dressing.  Complications:  None.  The patient tolerated the procedure well.  IMPRESSION:  Successful placement of a  right brachial vein dual lumen power PICC with sonographic and fluoroscopic guidance.  The catheter is ready for use.  Signed,  Sterling Big, MD Vascular & Interventional Radiologist [facility] Radiology   Original Report Authenticated By: Malachy Moan, Alexander.D.      Medications: Scheduled:    . albuterol  2.5 mg Nebulization BID  . amitriptyline  75 mg Oral QHS  . atorvastatin  40 mg Oral QPM  . cholecalciferol  1,000 Units Oral Daily  . diltiazem  120 mg Oral Daily  .  donepezil  5 mg Oral QHS  . insulin aspart  0-15 Units Subcutaneous TID WC  . insulin aspart  0-5 Units Subcutaneous QHS  . insulin glargine  20 Units Subcutaneous BID  . ipratropium  0.5 mg Nebulization BID  . levofloxacin (LEVAQUIN) IV  250 mg Intravenous Q24H  . levothyroxine  100 mcg Oral QAC breakfast  . metoprolol succinate  50 mg Oral Daily  . pantoprazole  40 mg Oral Daily  . vancomycin  1,000 mg Intravenous Q24H  . Warfarin - Pharmacist Dosing Inpatient   Does not apply q1800   Continuous:    . sodium chloride 45 mL/hr at 08/08/12 1610     Assessment/Plan: Active Problems:  Fever  Pneumonia  Weakness  Fever/leukocytosis c PNA seen on 08/07/12 CXR: Increased right lung base opacity consistent with an evolving infiltrate. ?Health Care Acquired - On Borad spectrum Abx.  Flu PCR was (-).  Urine was Dirty but U Cx (-).  BC 1. NTD 2.gram neg rods?? .  Pacer pocket and prior surgical sites look fine.  onsider ECHO to R/Out veg??  Sed rate > 140 and CRP 27.8 which are very high.  I Informed Cards of her admission and forwarded my note to Dr Swaziland.  PICC in place.  FOLLOW LABS.  Fever curve is improving.  Weakness and FTT - PT/OT/CM.  CAD/History of NQWMI/3vCABG 06/2009 - No current Angina.  Chronic combined systolic and diastolic congestive heart failure c EF 30% (last reduction in EF may have been tachy mediated) -  It will probably take 4-6 months for EF to normalize - if it will. Watch for Vol overload while getting Hydration. 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  Continue Hydration as Cr 2.5 and current labs not back yet.  Watch for vol overload. Medications are renally dosed.  Chronic Anticoagulation AFib.  Last INR > 5 - not bleeding - will let her trend down. Coumadin held yesterday.  Afib S/p Ablation and BiV Pacer - pacer Interrogated yesterday and Dr Johney Frame saw pt which was appreciated. Pacer and pocket  are fine. There is a phone note from Deliah Goody, RN 08/07/2012  3:28 PM: Spoke with pt husband, he has a lot of questions regarding the pts current hosp and her leads. Sent a message to [employer], she saw the pt today.  GERD - On PPI  Hypothyroidism - On Synthroid  Multifocal Anemia of Chronic Dz = Iron Def Anemia/B12 def and CKDz on Aranesp/Iron/B12 per Dr Arline Asp IgG lambda monoclonal gammopathy of uncertain significance since 11/04 per Dr Arline Asp  OA S/P TKR and Knee functional and no Effusion  H/O CVA 10/04 - continue ASA on D/c.  LE Edema/CVI/venous Stasis  Obesity - needs weight off  R Goin Abscess 05/2012 - no infection currently  Poorly controlled DM2 c Neuropathy/Non-Prolif DM Retinopathy/nephropathy c Cr 1.3-1.8 without microalbuminuria  - Lantus and SSI - having some lows  - so I will back off the lantus a little.    Basename 08/08/12 0642 08/08/12 0602 08/07/12 2119  GLUCAP 112* 63* 176*   ID -  Anti-infectives     Start     Dose/Rate Route Frequency Ordered Stop   08/07/12 1600   vancomycin (VANCOCIN) IVPB 1000 mg/200 mL premix        1,000 mg 200 mL/hr over 60 Minutes Intravenous Every 24 hours 08/07/12 0847     08/07/12 1600   Levofloxacin (LEVAQUIN) IVPB 250 mg        250 mg 50 mL/hr over 60 Minutes Intravenous Every 24 hours 08/07/12 0847     08/06/12 1600   levofloxacin (LEVAQUIN) IVPB 500 mg  Status:  Discontinued        500 mg 66.7 mL/hr over 90 Minutes Intravenous Every 24 hours 08/06/12 1518 08/06/12 1546   08/06/12 1600   vancomycin (VANCOCIN) 1,250 mg in sodium chloride 0.9 % 250 mL IVPB  Status:  Discontinued        1,250 mg 166.7 mL/hr over 90 Minutes Intravenous Every 24 hours 08/06/12 1542 08/07/12 0847   08/06/12 1600   levofloxacin (LEVAQUIN) IVPB 500 mg  Status:  Discontinued        500 mg 100 mL/hr over 60 Minutes Intravenous Every 24 hours 08/06/12 1547 08/07/12 0847   08/06/12 1530   vancomycin (VANCOCIN) IVPB 1000 mg/200 mL  premix  Status:  Discontinued     Comments: Pharm to help dose.      1,000 mg 200 mL/hr over 60 Minutes Intravenous Every 24 hours 08/06/12 1518 08/06/12 1529         DVT Prophylaxis - has Supra therapeutic INR b/c coumadin.    LOS: 2 days   [name] 08/08/2012, 7:20 AM

## 2012-08-08 NOTE — Progress Notes (Addendum)
CRITICAL VALUE ALERT  Critical value received:  INR 6.5  Date of notification:  08/08/2012  Time of notification:  1020  Critical value read back:yes  Nurse who received alert:  Jeremy Johann, RN  MD notified (1st page):  Holness, MD  Time of first page:  1023  MD notified (2nd page):  Time of second page:  Responding MD:   MD  Time MD responded:  [phone]

## 2012-08-09 ENCOUNTER — Ambulatory Visit: Payer: Medicare Other | Admitting: Oncology

## 2012-08-09 ENCOUNTER — Inpatient Hospital Stay (HOSPITAL_COMMUNITY): Payer: Medicare Other

## 2012-08-09 ENCOUNTER — Ambulatory Visit: Payer: Medicare Other

## 2012-08-09 ENCOUNTER — Other Ambulatory Visit: Payer: Medicare Other | Admitting: Lab

## 2012-08-09 LAB — GLUCOSE, CAPILLARY
Glucose-Capillary: 47 mg/dL — ABNORMAL LOW (ref 70–99)
Glucose-Capillary: 48 mg/dL — ABNORMAL LOW (ref 70–99)

## 2012-08-09 LAB — CBC
Platelets: 232 10*3/uL (ref 150–400)
RDW: 15.9 % — ABNORMAL HIGH (ref 11.5–15.5)
WBC: 9.7 10*3/uL (ref 4.0–10.5)

## 2012-08-09 LAB — BASIC METABOLIC PANEL
Calcium: 8.3 mg/dL — ABNORMAL LOW (ref 8.4–10.5)
Chloride: 99 mEq/L (ref 96–112)
Creatinine, Ser: 2.18 mg/dL — ABNORMAL HIGH (ref 0.50–1.10)
GFR calc Af Amer: 24 mL/min — ABNORMAL LOW (ref >90)

## 2012-08-09 LAB — CULTURE, BLOOD (ROUTINE X 2)

## 2012-08-09 LAB — PROTIME-INR
INR: 2.62 — ABNORMAL HIGH (ref 0.00–1.49)
Prothrombin Time: 26.7 seconds — ABNORMAL HIGH (ref 11.6–15.2)

## 2012-08-09 MED ORDER — WARFARIN SODIUM 2.5 MG PO TABS
2.5000 mg | ORAL_TABLET | Freq: Once | ORAL | Status: AC
  Administered 2012-08-09: 2.5 mg via ORAL
  Filled 2012-08-09: qty 1

## 2012-08-09 MED ORDER — INSULIN ASPART 100 UNIT/ML ~~LOC~~ SOLN
0.0000 [IU] | Freq: Three times a day (TID) | SUBCUTANEOUS | Status: DC
  Administered 2012-08-09: 1 [IU] via SUBCUTANEOUS
  Administered 2012-08-09 – 2012-08-10 (×2): 2 [IU] via SUBCUTANEOUS
  Administered 2012-08-10: 9 [IU] via SUBCUTANEOUS
  Administered 2012-08-10: 2 [IU] via SUBCUTANEOUS
  Administered 2012-08-11: 5 [IU] via SUBCUTANEOUS

## 2012-08-09 MED ORDER — SODIUM CHLORIDE 0.9 % IJ SOLN
10.0000 mL | INTRAMUSCULAR | Status: DC | PRN
  Administered 2012-08-09 – 2012-08-12 (×7): 10 mL

## 2012-08-09 MED ORDER — BENZONATATE 100 MG PO CAPS
200.0000 mg | ORAL_CAPSULE | Freq: Three times a day (TID) | ORAL | Status: DC
  Administered 2012-08-09 – 2012-08-12 (×10): 200 mg via ORAL
  Filled 2012-08-09 (×14): qty 2

## 2012-08-09 MED ORDER — GLUCOSE 40 % PO GEL
ORAL | Status: AC
  Administered 2012-08-09: 37.5 g
  Filled 2012-08-09: qty 1

## 2012-08-09 MED ORDER — INSULIN ASPART 100 UNIT/ML ~~LOC~~ SOLN
0.0000 [IU] | Freq: Every day | SUBCUTANEOUS | Status: DC
  Administered 2012-08-10: 4 [IU] via SUBCUTANEOUS

## 2012-08-09 MED ORDER — INSULIN GLARGINE 100 UNIT/ML ~~LOC~~ SOLN
16.0000 [IU] | Freq: Two times a day (BID) | SUBCUTANEOUS | Status: DC
  Administered 2012-08-09 – 2012-08-12 (×6): 16 [IU] via SUBCUTANEOUS

## 2012-08-09 NOTE — Progress Notes (Signed)
Subjective: Admitted with 4 days fever, chills, rigors, weakness, FTT, cough without sputum. CXR did show PNA BC + for G Neg Rods. Supratherapeutic INR had worsened and given 2.5 mg Vit K Low CBG again this am. Still weak tire and coughing. Eating poorly  Objective: Vital signs in last 24 hours: Temp:  [97.8 F (36.6 C)-98.9 F (37.2 C)] 98.2 F (36.8 C) (02/06 0619) Pulse Rate:  [73-76] 73  (02/06 0619) Resp:  [18] 18  (02/06 0619) BP: (119-133)/(45-52) 133/47 mmHg (02/06 0619) SpO2:  [94 %-99 %] 97 % (02/06 0619) Weight:  [98.158 kg (216 lb 6.4 oz)] 98.158 kg (216 lb 6.4 oz) (02/06 0619) Weight change: -0.091 kg (-3.2 oz) Last BM Date: 08/05/12  CBG (last 3)   Basename 08/09/12 0651 08/09/12 2952 08/09/12 0622  GLUCAP 78 48* 47*    Intake/Output from previous day:  Intake/Output Summary (Last 24 hours) at 08/09/12 0711 Last data filed at 08/08/12 2130  Gross per 24 hour  Intake 1087.33 ml  Output   1101 ml  Net -13.67 ml   02/05 0701 - 02/06 0700 In: 1087.3 [P.O.:820; I.V.:267.3] Out: 1101 [Urine:1100; Stool:1]   Physical Exam  General appearance: A and O.  Throat: oropharynx clear and cervical LNs - Resp: Basilar distant, o/w clear Cardio: Reg/Pacer/ C/D/I GI: soft, non-tender; bowel sounds normal; no masses,  no organomegaly, Obese Extremities: no clubbing, cyanosis, min E noted B Skin  warm   Lab Results:  [facility] 08/09/12 0506 08/08/12 0855  NA 134* 132*  K 3.8 3.7  CL 99 97  CO2 23 25  GLUCOSE 42* 90  BUN 36* 40*  CREATININE 2.18* 2.42*  CALCIUM 8.3* 8.4  MG -- --  PHOS -- --     Basename 08/07/12 0445  AST 29  ALT 17  ALKPHOS 145*  BILITOT 0.4  PROT 7.0  ALBUMIN 2.2*     Basename 08/09/12 0506 08/08/12 0855  WBC 9.7 8.4  NEUTROABS -- --  HGB 8.3* 7.8*  HCT 25.4* 24.1*  MCV 87.0 86.7  PLT 232 188    Lab Results  Component Value Date   INR 2.62* 08/09/2012   INR 6.50* 08/08/2012   INR 5.76* 08/07/2012    No results found  for this basename: CKTOTAL:3,CKMB:3,CKMBINDEX:3,TROPONINI:3 in the last 72 hours  No results found for this basename: TSH,T4TOTAL,FREET3,T3FREE,THYROIDAB in the last 72 hours  No results found for this basename: VITAMINB12:2,FOLATE:2,FERRITIN:2,TIBC:2,IRON:2,RETICCTPCT:2 in the last 72 hours  Micro Results: Recent Results (from the past 240 hour(s))  CULTURE, BLOOD (ROUTINE X 2)     Status: Normal (Preliminary result)   Collection Time   08/06/12  4:00 PM      Component Value Range Status Comment   Specimen Description BLOOD LEFT ARM   Final    Special Requests BOTTLES DRAWN AEROBIC ONLY 2CC   Final    Culture  Setup Time 08/06/2012 23:48   Final    Culture     Final    Value: GRAM POSITIVE COCCI IN CLUSTERS     Note: Gram Stain Report Called to,Read Back By and Verified With: TARA PARKER@1205  ON [id number] BY [facility]   Report Status PENDING   Incomplete   CULTURE, BLOOD (ROUTINE X 2)     Status: Normal (Preliminary result)   Collection Time   08/06/12  4:10 PM      Component Value Range Status Comment   Specimen Description BLOOD RIGHT HAND   Final    Special Requests BOTTLES DRAWN AEROBIC  ONLY 1CC   Final    Culture  Setup Time 08/06/2012 23:48   Final    Culture     Final    Value: GRAM NEGATIVE RODS     Note: Gram Stain Report Called to,Read Back By and Verified With: [facility] 08/08/2012 5:32AM YIMSU   Report Status PENDING   Incomplete   URINE CULTURE     Status: Normal   Collection Time   08/06/12  7:47 PM      Component Value Range Status Comment   Specimen Description URINE, CLEAN CATCH   Final    Special Requests NONE   Final    Culture  Setup Time 08/06/2012 20:31   Final    Colony Count NO GROWTH   Final    Culture NO GROWTH   Final    Report Status 08/07/2012 FINAL   Final      Studies/Results: No results found.   Medications: Scheduled:    . albuterol  2.5 mg Nebulization BID  . amitriptyline  75 mg Oral QHS  . atorvastatin  40 mg Oral QPM  . cholecalciferol   1,000 Units Oral Daily  . diltiazem  120 mg Oral Daily  . donepezil  5 mg Oral QHS  . insulin aspart  0-15 Units Subcutaneous TID WC  . insulin aspart  0-5 Units Subcutaneous QHS  . insulin glargine  20 Units Subcutaneous BID  . ipratropium  0.5 mg Nebulization BID  . levofloxacin (LEVAQUIN) IV  250 mg Intravenous Q24H  . levothyroxine  100 mcg Oral QAC breakfast  . metoprolol succinate  50 mg Oral Daily  . pantoprazole  40 mg Oral Daily  . vancomycin  1,000 mg Intravenous Q24H  . Warfarin - Pharmacist Dosing Inpatient   Does not apply q1800   Continuous:    . sodium chloride 20 mL/hr at 08/08/12 4782     Assessment/Plan: Active Problems:  Fever  Pneumonia  Weakness  Fever/leukocytosis c PNA seen on 08/07/12 CXR: Increased right lung base opacity consistent with an evolving infiltrate. ?Health Care Acquired - On Broad spectrum Abx and with g-r bacteremia - wean vanco after one more dose.  Flu PCR was (-).  Urine was Dirty but U Cx (-).  BC 1. NTD 2.gram neg rods?? .  Pacer pocket and prior surgical sites look fine.  Sed rate > 140 and CRP 27.8 which are very high. PICC in place.  FOLLOW LABS.  Fever curve is better. ECHO:    - Left ventricle: The cavity size was normal. Wall thickness was normal. Systolic function was mildly reduced. The estimated ejection fraction was in the range of 45% to 50%. Doppler parameters are consistent with abnormal left  ventricular relaxation (grade 1 diastolic dysfunction).                - Aortic valve: Valve mobility was restricted. There was mild stenosis. Valve area: 0.97cm^2(VTI).                - Mitral valve: Calcified annulus. Mild regurgitation.                 - Left atrium: The atrium was moderately dilated.                 - Right atrium: The atrium was moderately dilated.                 - Tricuspid valve: Wide-open regurgitation.                 -  Pulmonary arteries: PA peak pressure: 33mm Hg (S).  Gram Neg Rod Bacteremia and the second  Cx = GPC in clusters - Await speciation and Sensitivities.  Levaquin should be fine for the gnr and Vanco for the gpc.  She is clinically better.  Recheck Cxs and Recheck CXR.  Add tessalon for Cough.  Weakness and FTT - PT/OT/CM.  CAD/History of NQWMI/3vCABG 06/2009 - No current Angina.  Chronic combined systolic and diastolic congestive heart failure c last EF 30%  - now 45-50% great!!  (last reduction in EF may have been tachy mediated)Watch for Vol overload while getting Hydration. Lasix still on hold  HTN - BP fine  ARF on top of CKD 3-4 with baseline Cr @ 1.7 - admitted with Azotemia and Cr 2.8.  Continue very gentle hydration with lasix hold as Cr 2.2 and slowly improving daily.  Watch for vol overload. Medications are renally dosed.  Chronic Anticoagulation for AFib.  S/P Vit K and now INR therapeutic!Marland Kitchen - not bleeding. Coumadin to be restarted per pharm.  Afib S/p Ablation and BiV Pacer - pacer Interrogated Monday and Dr Johney Frame saw pt which was appreciated. Pacer and pocket are fine.  Multifocal Anemia of Chronic Dz = Iron Def Anemia/B12 def and CKDz on Aranesp/Iron/B12 per Dr Arline Asp - Hbg got to 7.8 and now 8.3.  She is Type and screened.  No need for transfusion yet. IgG lambda monoclonal gammopathy of uncertain significance since 11/04 per Dr Arline Asp  Poorly controlled DM2 c Neuropathy/Non-Prolif DM Retinopathy/nephropathy c baseline Cr 1.3-1.8 without microalbuminuria  - Lantus and SSI - having some lows  - so I will back off the lantus a little more and go to sensitive sliding scale.Alvira Philips 08/09/12 0651 08/09/12 0638 08/09/12 0622  GLUCAP 78 48* 47*   ID -  Anti-infectives     Start     Dose/Rate Route Frequency Ordered Stop   08/07/12 1600   vancomycin (VANCOCIN) IVPB 1000 mg/200 mL premix        1,000 mg 200 mL/hr over 60 Minutes Intravenous Every 24 hours 08/07/12 0847     08/07/12 1600   Levofloxacin (LEVAQUIN) IVPB 250 mg        250 mg 50 mL/hr over 60  Minutes Intravenous Every 24 hours 08/07/12 0847     08/06/12 1600   levofloxacin (LEVAQUIN) IVPB 500 mg  Status:  Discontinued        500 mg 66.7 mL/hr over 90 Minutes Intravenous Every 24 hours 08/06/12 1518 08/06/12 1546   08/06/12 1600   vancomycin (VANCOCIN) 1,250 mg in sodium chloride 0.9 % 250 mL IVPB  Status:  Discontinued        1,250 mg 166.7 mL/hr over 90 Minutes Intravenous Every 24 hours 08/06/12 1542 08/07/12 0847   08/06/12 1600   levofloxacin (LEVAQUIN) IVPB 500 mg  Status:  Discontinued        500 mg 100 mL/hr over 60 Minutes Intravenous Every 24 hours 08/06/12 1547 08/07/12 0847   08/06/12 1530   vancomycin (VANCOCIN) IVPB 1000 mg/200 mL premix  Status:  Discontinued     Comments: Pharm to help dose.      1,000 mg 200 mL/hr over 60 Minutes Intravenous Every 24 hours 08/06/12 1518 08/06/12 1529         DVT Prophylaxis - coumadin.    LOS: 3 days   Lynda Capistran M 08/09/2012, 7:11 AM

## 2012-08-09 NOTE — Progress Notes (Signed)
Pharmacy Consult - Vancomycin, Coumadin  Admitted 2/3 with fever, chills, rigors, weakness Chest Xray confirms pneumonia Scr today = 2.18 (improved) Currently afebrile, cultures with GNR x 1, GPC x 1 Clinically better On Coumadin PTA for Afib, INR now therapeutic after being reversed with vitamin K at 2.62  Plan: 1) Coumadin 2.5 mg po x 1 today 2) Continue Vancomycin 1 G iv Q 24 hours 3) Continue Levaquin 250 mg iv Q 24 hours 4) Follow up cultures, INR in AM  Thank you. Okey Regal, PharmD [phone]

## 2012-08-09 NOTE — Progress Notes (Signed)
Pt c/o coughing; pt given PRN Guaifenesin at this time; will cont. To monitor.

## 2012-08-09 NOTE — Evaluation (Signed)
Occupational Therapy Evaluation Patient Details Name: [name] MRN: [id number] DOB: 1933/04/02 Today's Date: 08/09/2012 Time: 4098-1191 OT Time Calculation (min): 16 min  OT Assessment / Plan / Recommendation Clinical Impression  Pt admitted with fever, coughing and chills, diagnosed with PNA. Will benefit from continued OT services to address below problem list in prep for safe return home with 24/7 assist.     OT Assessment  Patient needs continued OT Services    Follow Up Recommendations  No OT follow up;Supervision/Assistance - 24 hour    Barriers to Discharge None    Equipment Recommendations  None recommended by OT    Recommendations for Other Services    Frequency  Min 2X/week    Precautions / Restrictions Precautions Precautions: Fall Restrictions Weight Bearing Restrictions: No   Pertinent Vitals/Pain See vitals    ADL  Grooming: Performed;Brushing hair;Set up Where Assessed - Grooming: Unsupported sitting Upper Body Bathing: Simulated;Set up Where Assessed - Upper Body Bathing: Unsupported sitting Lower Body Bathing: Simulated;Min guard Where Assessed - Lower Body Bathing: Unsupported sit to stand Upper Body Dressing: Simulated;Set up Where Assessed - Upper Body Dressing: Unsupported sit to stand Lower Body Dressing: Performed;Min guard Where Assessed - Lower Body Dressing: Unsupported sit to stand Toilet Transfer: Simulated;Minimal assistance Toilet Transfer Method: Sit to stand Toilet Transfer Equipment:  (bed) Equipment Used: Gait belt;Rolling walker Transfers/Ambulation Related to ADLs: min guard for ambulation by bed ADL Comments: Pt recently returned to bed after sitting up in chair much of day.  Pt willing to take several steps around bed but politely declining sitting up in chair or need to use bathroom.  Generally weak.     OT Diagnosis: Generalized weakness  OT Problem List: Decreased strength;Decreased activity tolerance OT Treatment  Interventions: Self-care/ADL training;Energy conservation;DME and/or AE instruction;Patient/family education;Therapeutic activities   OT Goals Acute Rehab OT Goals OT Goal Formulation: With patient Time For Goal Achievement: 08/16/12 Potential to Achieve Goals: Good ADL Goals Pt Will Perform Grooming: with modified independence;Standing at sink ADL Goal: Grooming - Progress: Goal set today Pt Will Transfer to Toilet: with supervision;Ambulation;with DME;Comfort height toilet ADL Goal: Toilet Transfer - Progress: Goal set today Pt Will Perform Toileting - Clothing Manipulation: with modified independence;Standing ADL Goal: Toileting - Clothing Manipulation - Progress: Goal set today Pt Will Perform Toileting - Hygiene: with modified independence;Standing at 3-in-1/toilet ADL Goal: Toileting - Hygiene - Progress: Goal set today Miscellaneous OT Goals Miscellaneous OT Goal #1: Pt will perform all functional mobility at superivsion level. OT Goal: Miscellaneous Goal #1 - Progress: Goal set today  Visit Information  Last OT Received On: 08/09/12 Assistance Needed: +1    Subjective Data      Prior Functioning     Home Living Lives With: Spouse Available Help at Discharge: Family;Available 24 hours/day Type of Home: House Home Access: Stairs to enter [employer] of Steps: 2 Entrance Stairs-Rails: Right;Left;Can reach both Home Layout: One level Bathroom Shower/Tub: Engineer, manufacturing systems: Standard Bathroom Accessibility: Yes How Accessible: Accessible via walker Home Adaptive Equipment: Tub transfer bench;Bedside commode/3-in-1;Grab bars around toilet;Straight cane;Walker - rolling;Grab bars in shower Prior Function Level of Independence: Independent Able to Take Stairs?: Yes Driving: No Vocation: Retired Musician: No difficulties Dominant Hand: Right         Vision/Perception     Copywriter, advertising Overall Cognitive  Status: Appears within functional limits for tasks assessed/performed Arousal/Alertness: Awake/alert Orientation Level: Appears intact for tasks assessed Behavior During Session: [facility] for tasks performed  Extremity/Trunk Assessment Right Upper Extremity Assessment RUE ROM/Strength/Tone: [facility] [place] for tasks assessed Left Upper Extremity Assessment LUE ROM/Strength/Tone: WFL for tasks assessed     Mobility Bed Mobility Bed Mobility: Supine to Sit;Sitting - Scoot to [place] of Bed;Sit to Supine Supine to Sit: 4: Min assist;HOB elevated (HOB 30 degrees) Sitting - Scoot to Edge of Bed: 7: Independent Sit to Supine: 4: Min guard;HOB elevated (HOB 30 degrees) Details for Bed Mobility Assistance: Pt tried to come straight up  instead of rolling L and was assissted via L elbow to sit Transfers Transfers: Sit to Stand;Stand to Sit Sit to Stand: 4: Min assist;From bed;With upper extremity assist Stand to Sit: 4: Min assist;To bed;With upper extremity assist Details for Transfer Assistance: verbal cueing for safe hand placement. Assist for lift off from bed.     Exercise     Balance Balance Balance Assessed: Yes Static Sitting Balance Static Sitting - Balance Support: Feet supported;No upper extremity supported Static Sitting - Level of Assistance: 7: Independent Static Sitting - Comment/# of Minutes: 2 min sitting EOB Dynamic Standing Balance Dynamic Standing - Balance Support: During functional activity;No upper extremity supported (washing hands after trip to bathroom) Dynamic Standing - Level of Assistance: 5: Stand by assistance   End of Session OT - End of Session Equipment Utilized During Treatment: Gait belt (RW) Activity Tolerance: Patient limited by fatigue Patient left: in bed;with call bell/phone within reach;with family/visitor present  GO    08/09/2012 Cipriano Mile OTR/L Pager [phone] Office [phone]  Cipriano Mile 08/09/2012, 3:24 PM

## 2012-08-09 NOTE — Progress Notes (Signed)
Hypoglycemic Event  CBG: 47  Treatment: 15 GM gel  Symptoms: None  Follow-up CBG: Time:0650 CBG Result:78  Possible Reasons for Event: Inadequate meal intake  Comments/MD notified:Pt blood sugar came to 78    Dorn Hartshorne, Salome Arnt  Remember to initiate Hypoglycemia Order Set & complete

## 2012-08-09 NOTE — Progress Notes (Signed)
Physical Therapy Treatment Patient Details Name: [name] MRN: [id number] DOB: Oct 22, 1932 Today's Date: 08/09/2012 Time: 7829-5621 PT Time Calculation (min): 23 min  PT Assessment / Plan / Recommendation Comments on Treatment Session  pt admitted with s/s consistent with PNA.  Pt continues to show decr activity tolerance and can stil benefit from continued PT after D/C    Follow Up Recommendations  Home health PT;Supervision for mobility/OOB     Does the patient have the potential to tolerate intense rehabilitation     Barriers to Discharge        Equipment Recommendations  None recommended by PT    Recommendations for Other Services    Frequency Min 3X/week   Plan Discharge plan remains appropriate;Frequency remains appropriate    Precautions / Restrictions Precautions Precautions: Fall Restrictions Weight Bearing Restrictions: No   Pertinent Vitals/Pain     Mobility  Bed Mobility Bed Mobility: Supine to Sit;Sitting - Scoot to Edge of Bed Supine to Sit: 4: Min assist Sitting - Scoot to [place] of Bed: 7: Independent Details for Bed Mobility Assistance: Pt tried to come straight up  instead of rolling L and was assissted via L elbow to sit Transfers Transfers: Sit to Stand;Stand to Sit Sit to Stand: 4: Min assist;With upper extremity assist;From bed;From chair/3-in-1 Stand to Sit: 4: Min assist;With upper extremity assist;To chair/3-in-1 Details for Transfer Assistance: vc's for hand placement and transfer safety issues; minimal assist to help her come forward Ambulation/Gait Ambulation/Gait Assistance: 4: Min guard;4: Min assist (with contact) Ambulation Distance (Feet): 190 Feet Assistive device: Rolling walker Ambulation/Gait Assistance Details: although generally steady, pt has an occaisional tendency to list R or wander R with the RW and with fatigue becomes mildly staggery with L foot clumsiness. Gait Pattern: Step-through pattern;Decreased step length -  right;Decreased step length - left;Decreased stride length;Wide base of support Gait velocity: decreased Stairs: No    Exercises     PT Diagnosis:    PT Problem List:   PT Treatment Interventions:     PT Goals Acute Rehab PT Goals Time For Goal Achievement: 08/21/12 Potential to Achieve Goals: Good Pt will go Supine/Side to Sit: with supervision PT Goal: Supine/Side to Sit - Progress: Progressing toward goal Pt will go Sit to Supine/Side: with modified independence;with HOB 0 degrees PT Goal: Sit to Supine/Side - Progress: Progressing toward goal Pt will go Sit to Stand: with supervision PT Goal: Sit to Stand - Progress: Progressing toward goal Pt will go Stand to Sit: with supervision PT Goal: Stand to Sit - Progress: Progressing toward goal Pt will Ambulate: >150 feet;with supervision;with least restrictive assistive device PT Goal: Ambulate - Progress: Progressing toward goal Pt will Go Up / Down Stairs: 1-2 stairs;with min assist;with rail(s) PT Goal: Up/Down Stairs - Progress: Not met  Visit Information  Last PT Received On: 08/09/12 Assistance Needed: +1    Subjective Data  Subjective: I'm just so sore.Marland KitchenMarland KitchenI can't get anything up   Cognition  Cognition Overall Cognitive Status: Appears within functional limits for tasks assessed/performed Arousal/Alertness: Awake/alert Orientation Level: Appears intact for tasks assessed Behavior During Session: [facility] for tasks performed    Balance  Balance Balance Assessed: Yes Dynamic Standing Balance Dynamic Standing - Balance Support: During functional activity;No upper extremity supported (washing hands after trip to bathroom) Dynamic Standing - Level of Assistance: 5: Stand by assistance  End of Session PT - End of Session Activity Tolerance: Patient limited by fatigue;Patient tolerated treatment well Patient left: Other (comment) (to x-ray)  Nurse Communication: Mobility status   GP     Januel Doolan, Eliseo Gum 08/09/2012,  12:07 PM  08/09/2012   Bing, PT [phone] (pager)

## 2012-08-09 NOTE — Progress Notes (Signed)
CRITICAL VALUE ALERT  Critical value received:  Glucose 42  Date of notification:  08/09/12  Time of notification:  0650  Critical value read back:yes  Nurse who received alert:  France Ravens  MD notified (1st page):  Schorr  Time of first page:  0655, MD text paged of pt blood sugar coming up to 78  MD notified (2nd page):  Time of second page:  Responding MD:  MD text paged of pt blood sugar coming up to 78  Time MD responded:  MD text paged of blood sugar coming up, no call back.

## 2012-08-10 LAB — BASIC METABOLIC PANEL
Chloride: 99 mEq/L (ref 96–112)
GFR calc Af Amer: 27 mL/min — ABNORMAL LOW (ref >90)
Potassium: 4 mEq/L (ref 3.5–5.1)
Sodium: 133 mEq/L — ABNORMAL LOW (ref 135–145)

## 2012-08-10 LAB — CULTURE, BLOOD (ROUTINE X 2)

## 2012-08-10 LAB — CBC
HCT: 24.1 % — ABNORMAL LOW (ref 36.0–46.0)
Hemoglobin: 7.8 g/dL — ABNORMAL LOW (ref 12.0–15.0)
WBC: 11.2 10*3/uL — ABNORMAL HIGH (ref 4.0–10.5)

## 2012-08-10 LAB — IRON AND TIBC
Iron: 23 ug/dL — ABNORMAL LOW (ref 42–135)
Saturation Ratios: 10 % — ABNORMAL LOW (ref 20–55)
UIBC: 206 ug/dL (ref 125–400)

## 2012-08-10 LAB — GLUCOSE, CAPILLARY: Glucose-Capillary: 183 mg/dL — ABNORMAL HIGH (ref 70–99)

## 2012-08-10 LAB — PROTIME-INR
INR: 1.58 — ABNORMAL HIGH (ref 0.00–1.49)
Prothrombin Time: 18.4 seconds — ABNORMAL HIGH (ref 11.6–15.2)

## 2012-08-10 MED ORDER — DARBEPOETIN ALFA-POLYSORBATE 200 MCG/0.4ML IJ SOLN
200.0000 ug | Freq: Once | INTRAMUSCULAR | Status: AC
  Administered 2012-08-10: 200 ug via SUBCUTANEOUS
  Filled 2012-08-10: qty 0.4

## 2012-08-10 MED ORDER — ASPIRIN EC 81 MG PO TBEC
81.0000 mg | DELAYED_RELEASE_TABLET | Freq: Every day | ORAL | Status: DC
  Administered 2012-08-10 – 2012-08-12 (×3): 81 mg via ORAL
  Filled 2012-08-10 (×3): qty 1

## 2012-08-10 MED ORDER — FERUMOXYTOL INJECTION 510 MG/17 ML
510.0000 mg | INTRAVENOUS | Status: DC
  Administered 2012-08-10: 510 mg via INTRAVENOUS
  Filled 2012-08-10: qty 17

## 2012-08-10 MED ORDER — FUROSEMIDE 20 MG PO TABS
10.0000 mg | ORAL_TABLET | Freq: Every day | ORAL | Status: DC
  Filled 2012-08-10: qty 0.5

## 2012-08-10 MED ORDER — WARFARIN SODIUM 2.5 MG PO TABS
2.5000 mg | ORAL_TABLET | Freq: Once | ORAL | Status: AC
  Administered 2012-08-10: 2.5 mg via ORAL
  Filled 2012-08-10: qty 1

## 2012-08-10 MED ORDER — PREDNISONE 10 MG PO TABS
10.0000 mg | ORAL_TABLET | Freq: Once | ORAL | Status: AC
  Administered 2012-08-10: 10 mg via ORAL
  Filled 2012-08-10: qty 1

## 2012-08-10 NOTE — Progress Notes (Signed)
ANTICOAGULATION CONSULT NOTE - Follow Up Consult  Pharmacy Consult for Coumadin Indication: atrial fibrillation  Allergies  Allergen Reactions  . Betadine (Povidone Iodine) Itching  . Capoten (Captopril) Other (See Comments)    unknown  . Codone (Hydrocodone) Nausea And Vomiting  . Methadone Nausea And Vomiting  . Penicillins Swelling  . Propoxyphene And Methadone     Intolerance to darvocet    Patient Measurements: Height: 5\' 3"  (160 cm) Weight: 210 lb 15.7 oz (95.7 kg) IBW/kg (Calculated) : 52.4  Heparin Dosing Weight:   Vital Signs: Temp: 99.7 F (37.6 C) (02/07 0552) Temp src: Oral (02/07 0552) BP: 130/52 mmHg (02/07 0552) Pulse Rate: 77  (02/07 0552)  Labs:  Basename 08/10/12 0500 08/09/12 0506 08/08/12 0855  HGB 7.8* 8.3* --  HCT 24.1* 25.4* 24.1*  PLT 259 232 188  APTT -- -- --  LABPROT 18.4* 26.7* 52.4*  INR 1.58* 2.62* 6.50*  HEPARINUNFRC -- -- --  CREATININE 1.97* 2.18* 2.42*  CKTOTAL -- -- --  CKMB -- -- --  TROPONINI -- -- --    Estimated Creatinine Clearance: 26.3 ml/min (by C-G formula based on Cr of 1.97).   Medications:  Scheduled:    . albuterol  2.5 mg Nebulization BID  . amitriptyline  75 mg Oral QHS  . aspirin EC  81 mg Oral Daily  . atorvastatin  40 mg Oral QPM  . benzonatate  200 mg Oral TID  . cholecalciferol  1,000 Units Oral Daily  . darbepoetin (ARANESP) injection - NON-DIALYSIS  200 mcg Subcutaneous Once  . diltiazem  120 mg Oral Daily  . donepezil  5 mg Oral QHS  . furosemide  10 mg Oral Daily  . insulin aspart  0-5 Units Subcutaneous QHS  . insulin aspart  0-9 Units Subcutaneous TID WC  . insulin glargine  16 Units Subcutaneous BID  . ipratropium  0.5 mg Nebulization BID  . levofloxacin (LEVAQUIN) IV  250 mg Intravenous Q24H  . levothyroxine  100 mcg Oral QAC breakfast  . metoprolol succinate  50 mg Oral Daily  . pantoprazole  40 mg Oral Daily  . predniSONE  10 mg Oral Once  . [COMPLETED] warfarin  2.5 mg Oral  ONCE-1800  . warfarin  2.5 mg Oral ONCE-1800  . Warfarin - Pharmacist Dosing Inpatient   Does not apply q1800  . [DISCONTINUED] vancomycin  1,000 mg Intravenous Q24H    Assessment: 79yo female with AFib, on Coumadin 7.5mg  daily except 5mg  on Tue/Thurs pta and admitted with a supratherapeutic INR.  She received Vit K on 2/5 as INR cont'd to increase to 6.5, now down to 1.58 this AM.  No bleeding problems noted.  Hg is essentially stable at 7.8 this AM, having hovered from 7.8-8.2 throughout this admit.    She is to receive Aranesp SQ x 1 today and am awaiting Iron study results for Feraheme dosing per Rx.  Goal of Therapy:  INR 2-3 Monitor platelets by anticoagulation protocol: Yes   Plan:  1.  Coumadin 2.5mg  today 2.  Continue daily INR 3.  F/U Iron studies and need for Feraheme  Marisue Humble, PharmD Clinical Pharmacist Portal System- [facility]

## 2012-08-10 NOTE — Progress Notes (Signed)
Pt given cough med at this time for c/o coughing; will cont. To monitor.

## 2012-08-10 NOTE — Progress Notes (Signed)
Subjective: Admitted with 4 days fever, chills, rigors, weakness, FTT, cough without sputum. CXR showed PNA 1 BC + for G Neg Rods, the other = Coag (-) Staph. Supratherapeutic INR had worsened and given 2.5 mg Vit K and now subtherapeutic. No more Low CBG's. S/P PICC Still weak tired and coughing.  She says she feels terrible from the cough. Eating poorly but improving daily Working with PT/OT Kidney function has slowly improved. As far as anemia - Hbg remaining borderline for a transfusion.   Objective: Vital signs in last 24 hours: Temp:  [98 F (36.7 C)-99.7 F (37.6 C)] 99.7 F (37.6 C) (02/07 0552) Pulse Rate:  [75-80] 77  (02/07 0552) Resp:  [18] 18  (02/07 0552) BP: (117-130)/(50-70) 130/52 mmHg (02/07 0552) SpO2:  [97 %] 97 % (02/07 0552) Weight:  [95.7 kg (210 lb 15.7 oz)] 95.7 kg (210 lb 15.7 oz) (02/07 0552) Weight change: -2.458 kg (-5 lb 6.7 oz) Last BM Date: 08/09/12  CBG (last 3)   Basename 08/10/12 0550 08/09/12 2105 08/09/12 1621  GLUCAP 183* 160* 145*    Intake/Output from previous day:  Intake/Output Summary (Last 24 hours) at 08/10/12 0655 Last data filed at 08/09/12 1821  Gross per 24 hour  Intake 869.33 ml  Output      0 ml  Net 869.33 ml   02/06 0701 - 02/07 0700 In: 869.3 [P.O.:600; I.V.:219.3; IV Piggyback:50] Out: -    Physical Exam  General appearance: A and O.  Throat: oropharynx clear and cervical LNs - Resp: Basilar distant, o/w clear Cardio: Reg/Pacer/ C/D/I GI: soft, non-tender; bowel sounds normal; no masses,  no organomegaly, Obese Extremities: no clubbing, cyanosis, min E noted B Skin  warm   Lab Results:  Basename 08/10/12 0500 08/09/12 0506  NA 133* 134*  K 4.0 3.8  CL 99 99  CO2 24 23  GLUCOSE 184* 42*  BUN 31* 36*  CREATININE 1.97* 2.18*  CALCIUM 8.2* 8.3*  MG -- --  PHOS -- --    No results found for this basename: AST:2,ALT:2,ALKPHOS:2,BILITOT:2,PROT:2,ALBUMIN:2 in the last 72 hours   Basename  08/10/12 0500 08/09/12 0506  WBC 11.2* 9.7  NEUTROABS -- --  HGB 7.8* 8.3*  HCT 24.1* 25.4*  MCV 88.0 87.0  PLT 259 232    Lab Results  Component Value Date   INR 1.58* 08/10/2012   INR 2.62* 08/09/2012   INR 6.50* 08/08/2012    No results found for this basename: CKTOTAL:3,CKMB:3,CKMBINDEX:3,TROPONINI:3 in the last 72 hours  No results found for this basename: TSH,T4TOTAL,FREET3,T3FREE,THYROIDAB in the last 72 hours  No results found for this basename: VITAMINB12:2,FOLATE:2,FERRITIN:2,TIBC:2,IRON:2,RETICCTPCT:2 in the last 72 hours  Micro Results: Recent Results (from the past 240 hour(s))  CULTURE, BLOOD (ROUTINE X 2)     Status: Normal   Collection Time   08/06/12  4:00 PM      Component Value Range Status Comment   Specimen Description BLOOD LEFT ARM   Final    Special Requests BOTTLES DRAWN AEROBIC ONLY 2CC   Final    Culture  Setup Time 08/06/2012 23:48   Final    Culture     Final    Value: STAPHYLOCOCCUS SPECIES (COAGULASE NEGATIVE)     Note: THE SIGNIFICANCE OF ISOLATING THIS ORGANISM FROM A SINGLE SET OF BLOOD CULTURES WHEN MULTIPLE SETS ARE DRAWN IS UNCERTAIN. PLEASE NOTIFY THE MICROBIOLOGY DEPARTMENT WITHIN ONE WEEK IF SPECIATION AND SENSITIVITIES ARE REQUIRED.     Note: Gram Stain Report Called to,Read Back  By and Verified With: TARA PARKER@[email or web address]  ON [id number] BY [facility]   Report Status 08/09/2012 FINAL   Final   CULTURE, BLOOD (ROUTINE X 2)     Status: Normal (Preliminary result)   Collection Time   08/06/12  4:10 PM      Component Value Range Status Comment   Specimen Description BLOOD RIGHT HAND   Final    Special Requests BOTTLES DRAWN AEROBIC ONLY 1CC   Final    Culture  Setup Time 08/06/2012 23:48   Final    Culture     Final    Value: ESCHERICHIA COLI     Note: Gram Stain Report Called to,Read Back By and Verified With: [facility] 08/08/2012 5:32AM YIMSU   Report Status PENDING   Incomplete   URINE CULTURE     Status: Normal   Collection Time   08/06/12  7:47 PM       Component Value Range Status Comment   Specimen Description URINE, CLEAN CATCH   Final    Special Requests NONE   Final    Culture  Setup Time 08/06/2012 20:31   Final    Colony Count NO GROWTH   Final    Culture NO GROWTH   Final    Report Status 08/07/2012 FINAL   Final      Studies/Results: Dg Chest 2 View  08/09/2012  *RADIOLOGY REPORT*  Clinical Data: Follow-up pneumonia  CHEST - 2 VIEW  Comparison: 08/07/2012  Findings: The patient is status post median sternotomy.  Multi lead pacer is in place via a left subclavian approach with lead tips stable in position. A right subclavian CVP is unchanged in position.  Low lung volumes are noted.  Persistent cardiomegaly is seen and is stable in degree.  There has been interval improvement in aeration at the right base with some persistent focal density compatible with improving right lower lobe pneumonia.  No pleural fluid is seen.  Some pulmonary vascular congestion with no signs of overt congestive failure are evident. A small right pleural effusion is again suggested.  No new areas of focal infiltrate are seen  Bony structures appear intact  IMPRESSION: Improving right lower lobe infiltrate.  Persistent small right pleural effusion.   Original Report Authenticated By: Rhodia Albright, M.D.      Medications: Scheduled:    . albuterol  2.5 mg Nebulization BID  . amitriptyline  75 mg Oral QHS  . atorvastatin  40 mg Oral QPM  . benzonatate  200 mg Oral TID  . cholecalciferol  1,000 Units Oral Daily  . diltiazem  120 mg Oral Daily  . donepezil  5 mg Oral QHS  . insulin aspart  0-5 Units Subcutaneous QHS  . insulin aspart  0-9 Units Subcutaneous TID WC  . insulin glargine  16 Units Subcutaneous BID  . ipratropium  0.5 mg Nebulization BID  . levofloxacin (LEVAQUIN) IV  250 mg Intravenous Q24H  . levothyroxine  100 mcg Oral QAC breakfast  . metoprolol succinate  50 mg Oral Daily  . pantoprazole  40 mg Oral Daily  . Warfarin - Pharmacist  Dosing Inpatient   Does not apply q1800   Continuous:    . sodium chloride 20 mL/hr at 08/09/12 1821     Assessment/Plan: Active Problems:  Fever  Pneumonia  Weakness  Improving RLL PNA - No More Fevers.  Cough is still bothering her and she is on good meds. Add one dose prednisone to decrease inflammatory portion of cough.  E Coli  Bacteremia - Levaquin - Await sensitivities.  Anticipate moving to oral Abx and D/cing her soon. GPC turned out to be Coag (-) Staph and contaminate - Vanco D/ced yesterday  Weakness and FTT - PT/OT/CM.  Strength should improve quickly from here.  CAD/History of NQWMI/3vCABG 06/2009/Chronic combined systolic and diastolic congestive heart failure c prior EF 30% - No current Angina and current  EF now 45-50% great!! Watch for Vol overload while getting gentle Hydration. Lasix still on hold but may start back at low dose soon.  ARF on top of CKD 3-4 with baseline Cr @ 1.7 - admitted with Azotemia and Cr 2.8.  Continue very gentle hydration with lasix hold as Cr now down to 1.97 and slowly improving daily.  Watch for vol overload. Medications are renally dosed.  HTN - BP fine  Chronic Anticoagulation for AFib.  S/P Ablation and Pacer.  S/P Vit K and now INR sub-therapeutic. Coumadin per pharm.  Afib S/p Ablation and BiV Pacer - pacer Interrogated Tuesday and Dr Johney Frame saw pt which was appreciated. Pacer and pocket are fine.  Multifocal Anemia of Chronic Dz = Iron Def Anemia/B12 def and CKDz on Aranesp/Iron/B12 per Dr Arline Asp - Hbg got to 7.8 and - 8.3 and now 7.8 again.  Give aranesp and check Iron/TIBC - If iron low also give Feraheme.  Aranesp 200 was her last dose. She is Type and screened.  No need for transfusion yet. IgG lambda monoclonal gammopathy of uncertain significance since 11/04 per Dr Arline Asp  Poorly controlled DM2 c Neuropathy/Non-Prolif DM Retinopathy/nephropathy c baseline Cr 1.3-1.8 without microalbuminuria  - Lantus and SSI - we backed  off the lantus a little and went to sensitive sliding scale.Marland Kitchen CBGs better   [facility] 08/10/12 0550 08/09/12 2105 08/09/12 1621  GLUCAP 183* 160* 145*   DVT Prophylaxis - coumadin. Add ASA back  OK to D/C tele.    LOS: 4 days   Eural Holzschuh M 08/10/2012, 6:55 AM

## 2012-08-10 NOTE — Progress Notes (Signed)
08/10/12    Pharmacy-  Feraheme 1330  79yo female with CKD stage 3-4 and iron deficient anemia.  Iron studies returned with Iron level low at 23.    1.  Feraheme 510mg  IV q72 x 2 doses 2.  Recheck Iron studies 30 days after 2nd dose of Feraheme  Marisue Humble, PharmD Clinical Pharmacist [facility]

## 2012-08-11 LAB — GLUCOSE, CAPILLARY
Glucose-Capillary: 365 mg/dL — ABNORMAL HIGH (ref 70–99)
Glucose-Capillary: 236 mg/dL — ABNORMAL HIGH (ref 70–99)

## 2012-08-11 LAB — CBC
HCT: 27.1 % — ABNORMAL LOW (ref 36.0–46.0)
MCH: 29.3 pg (ref 26.0–34.0)
MCHC: 33.6 g/dL (ref 30.0–36.0)
Platelets: 286 10*3/uL (ref 150–400)
RBC: 2.59 MIL/uL — ABNORMAL LOW (ref 3.87–5.11)
RDW: 15.5 % (ref 11.5–15.5)
WBC: 10.2 10*3/uL (ref 4.0–10.5)

## 2012-08-11 LAB — BASIC METABOLIC PANEL
Calcium: 8.2 mg/dL — ABNORMAL LOW (ref 8.4–10.5)
GFR calc non Af Amer: 29 mL/min — ABNORMAL LOW (ref >90)
Sodium: 135 mEq/L (ref 135–145)

## 2012-08-11 LAB — PROTIME-INR: INR: 1.73 — ABNORMAL HIGH (ref 0.00–1.49)

## 2012-08-11 MED ORDER — ACETAMINOPHEN 325 MG PO TABS
650.0000 mg | ORAL_TABLET | Freq: Once | ORAL | Status: AC
  Administered 2012-08-11: 650 mg via ORAL

## 2012-08-11 MED ORDER — DIPHENHYDRAMINE HCL 25 MG PO CAPS
25.0000 mg | ORAL_CAPSULE | Freq: Once | ORAL | Status: AC
  Administered 2012-08-11: 25 mg via ORAL
  Filled 2012-08-11: qty 1

## 2012-08-11 MED ORDER — INSULIN ASPART 100 UNIT/ML ~~LOC~~ SOLN
0.0000 [IU] | Freq: Every day | SUBCUTANEOUS | Status: DC
  Administered 2012-08-11: 2 [IU] via SUBCUTANEOUS

## 2012-08-11 MED ORDER — FUROSEMIDE 20 MG PO TABS
20.0000 mg | ORAL_TABLET | Freq: Every day | ORAL | Status: DC
  Administered 2012-08-11 – 2012-08-12 (×2): 20 mg via ORAL
  Filled 2012-08-11 (×2): qty 1

## 2012-08-11 MED ORDER — WARFARIN SODIUM 2.5 MG PO TABS
2.5000 mg | ORAL_TABLET | Freq: Once | ORAL | Status: AC
  Administered 2012-08-11: 2.5 mg via ORAL
  Filled 2012-08-11: qty 1

## 2012-08-11 MED ORDER — FUROSEMIDE 10 MG/ML IJ SOLN
20.0000 mg | Freq: Once | INTRAMUSCULAR | Status: AC
Start: 2012-08-11 — End: 2012-08-11
  Administered 2012-08-11: 20 mg via INTRAVENOUS
  Filled 2012-08-11: qty 2

## 2012-08-11 MED ORDER — INSULIN ASPART 100 UNIT/ML ~~LOC~~ SOLN
0.0000 [IU] | Freq: Three times a day (TID) | SUBCUTANEOUS | Status: DC
  Administered 2012-08-11: 5 [IU] via SUBCUTANEOUS
  Administered 2012-08-11: 8 [IU] via SUBCUTANEOUS
  Administered 2012-08-12: 3 [IU] via SUBCUTANEOUS

## 2012-08-11 NOTE — Progress Notes (Signed)
Blood bank informed RN there will be a delay in starting blood because pt had a positive antibody screen.

## 2012-08-11 NOTE — Progress Notes (Signed)
Subjective: Feels a little better.  Cough improved some.  Still very weak, unsteady when ambulating.  Notes weight gain since admission.    Objective: Vital signs in last 24 hours: Temp:  [97.4 F (36.3 C)-98.1 F (36.7 C)] 97.4 F (36.3 C) (02/08 0511) Pulse Rate:  [73-78] 78 (02/08 0511) Resp:  [18] 18 (02/08 0511) BP: (112-117)/(45-58) 115/45 mmHg (02/08 0511) SpO2:  [94 %-95 %] 95 % (02/08 0511) Weight:  [96.2 kg (212 lb 1.3 oz)] 96.2 kg (212 lb 1.3 oz) (02/08 0511) Weight change: 0.5 kg (1 lb 1.6 oz) Last BM Date: 08/09/12  CBG (last 3)   Recent Labs  08/10/12 1621 08/10/12 2105 08/11/12 0609  GLUCAP 352* 365* 292*    Intake/Output from previous day: 02/07 0701 - 02/08 0700 In: 548.3 [P.O.:280; I.V.:218.3; IV Piggyback:50] Out: 850 [Urine:850] Intake/Output this shift:    General appearance: alert and chronically ill Eyes: no scleral icterus Throat: oropharynx moist without erythema Resp: right basilar crackles, minimal cough Cardio: regular rate and rhythm and grade 2/6 SEM over LUSB GI: soft, non-tender; bowel sounds normal; no masses,  no organomegaly Extremities: no clubbing, cyanosis, 1+ bilateral lower extremity edema  Lab Results:  Recent Labs  08/10/12 0500 08/11/12 0545  NA 133* 135  K 4.0 4.1  CL 99 101  CO2 24 25  GLUCOSE 184* 291*  BUN 31* 32*  CREATININE 1.97* 1.63*  CALCIUM 8.2* 8.2*     Recent Labs  08/10/12 0500 08/11/12 0545  WBC 11.2* 10.2  HGB 7.8* 7.6*  HCT 24.1* 23.0*  MCV 88.0 88.8  PLT 259 286   Lab Results  Component Value Date   INR 1.73* 08/11/2012   INR 1.58* 08/10/2012   INR 2.62* 08/09/2012    Recent Labs  08/10/12 0755  FERRITIN 199  TIBC 229*  IRON 23*    Studies/Results: Dg Chest 2 View  08/09/2012  *RADIOLOGY REPORT*  Clinical Data: Follow-up pneumonia  CHEST - 2 VIEW  Comparison: 08/07/2012  Findings: The patient is status post median sternotomy.  Multi lead pacer is in place via a left subclavian  approach with lead tips stable in position. A right subclavian CVP is unchanged in position.  Low lung volumes are noted.  Persistent cardiomegaly is seen and is stable in degree.  There has been interval improvement in aeration at the right base with some persistent focal density compatible with improving right lower lobe pneumonia.  No pleural fluid is seen.  Some pulmonary vascular congestion with no signs of overt congestive failure are evident. A small right pleural effusion is again suggested.  No new areas of focal infiltrate are seen  Bony structures appear intact  IMPRESSION: Improving right lower lobe infiltrate.  Persistent small right pleural effusion.   Original Report Authenticated By: Rhodia Albright, M.D.      Medications: Scheduled: . amitriptyline  75 mg Oral QHS  . aspirin EC  81 mg Oral Daily  . atorvastatin  40 mg Oral QPM  . benzonatate  200 mg Oral TID  . cholecalciferol  1,000 Units Oral Daily  . diltiazem  120 mg Oral Daily  . donepezil  5 mg Oral QHS  . ferumoxytol  510 mg Intravenous Q72H  . furosemide  10 mg Oral Daily  . insulin aspart  0-5 Units Subcutaneous QHS  . insulin aspart  0-9 Units Subcutaneous TID WC  . insulin glargine  16 Units Subcutaneous BID  . levofloxacin (LEVAQUIN) IV  250 mg Intravenous Q24H  .  levothyroxine  100 mcg Oral QAC breakfast  . metoprolol succinate  50 mg Oral Daily  . pantoprazole  40 mg Oral Daily  . warfarin  2.5 mg Oral ONCE-1800  . Warfarin - Pharmacist Dosing Inpatient   Does not apply q1800   Continuous: . sodium chloride 20 mL/hr at 08/11/12 0735    Assessment/Plan: 1. RLL PNA - improving with Levaquin.  Cough better after steroids X 1.  Will hold on additional steroids and use Albuterol as needed and continue incentive spirometry. 2. E Coli Bacteremia -continue IV Levaquin- will transition to po prior to discharge.  GPC turned out to be Coag (-) Staph and contaminate - Vanco D/ced yesterday. 3. Multifocal Anemia of  Chronic Dz = received IV iron and Aranesp.  Slight drop in Hg.  Given frail status and CAD will transfuse 1 unit PRBC.   4. Weakness and FTT - continue PT/OT/CM. Transfusion may help. 5. CAD/History of NQWMI/3vCABG 06/2009/Chronic combined systolic and diastolic congestive heart failure c prior EF 30% -EF45-50%.  Continue medical therapy.  Lasix resumed due to weight gain.  Will increase to 20mg  daily (home dose 20mg  bid) and give extra 20mg  IV after transfusion to prevent volume overload.  6. ARF on top of CKD 3-4 with baseline Cr @ 1.7 - admitted with Azotemia and Cr 2.8. Approaching baseline after hydration. Monitor with resuming diuretics. Medications are renally dosed.  7.  HTN - continue current treatment. 8. Chronic Anticoagulation for AFib. S/P Ablation and Pacer. S/P Vit K and now INR sub-therapeutic. Coumadin per pharm.  9. Afib S/p Ablation and BiV Pacer - pacer Interrogated Tuesday and Dr Johney Frame saw pt which was appreciated. No evidence of pacer pocket infection.  10. Poorly controlled DM2 c Neuropathy/Non-Prolif DM Retinopathy/nephropathy c baseline Cr 1.3-1.8 without microalbuminuria - Lantus and SSI - we recently backed off the lantus a little and went to sensitive sliding scale. CBGs increased slightly yesterday due to steroids.  Change back to moderate scale. 11. Disposition- possible discharge tomorrow if stable/improved after transfusion.     LOS: 5 days   Rini Moffit,W DOUGLAS 08/11/2012, 8:27 AM

## 2012-08-11 NOTE — Progress Notes (Signed)
ANTICOAGULATION CONSULT NOTE - Follow Up Consult  Pharmacy Consult for Coumadin Indication: atrial fibrillation  Allergies  Allergen Reactions  . Betadine (Povidone Iodine) Itching  . Capoten (Captopril) Other (See Comments)    unknown  . Codone (Hydrocodone) Nausea And Vomiting  . Methadone Nausea And Vomiting  . Penicillins Swelling  . Propoxyphene And Methadone     Intolerance to darvocet    Patient Measurements: Height: 5\' 3"  (160 cm) Weight: 212 lb 1.3 oz (96.2 kg) IBW/kg (Calculated) : 52.4 Heparin Dosing Weight:   Vital Signs: Temp: 97.4 F (36.3 C) (02/08 0511) Temp src: Oral (02/08 0511) BP: 115/45 mmHg (02/08 0511) Pulse Rate: 78 (02/08 0511)  Labs:  Recent Labs  08/09/12 0506 08/10/12 0500 08/11/12 0545  HGB 8.3* 7.8* 7.6*  HCT 25.4* 24.1* 23.0*  PLT 232 259 286  LABPROT 26.7* 18.4* 19.7*  INR 2.62* 1.58* 1.73*  CREATININE 2.18* 1.97* 1.63*    Estimated Creatinine Clearance: 31.9 ml/min (by C-G formula based on Cr of 1.63).   Medications:  Scheduled:  . amitriptyline  75 mg Oral QHS  . aspirin EC  81 mg Oral Daily  . atorvastatin  40 mg Oral QPM  . benzonatate  200 mg Oral TID  . cholecalciferol  1,000 Units Oral Daily  . [COMPLETED] darbepoetin (ARANESP) injection - NON-DIALYSIS  200 mcg Subcutaneous Once  . diltiazem  120 mg Oral Daily  . donepezil  5 mg Oral QHS  . ferumoxytol  510 mg Intravenous Q72H  . furosemide  10 mg Oral Daily  . insulin aspart  0-5 Units Subcutaneous QHS  . insulin aspart  0-9 Units Subcutaneous TID WC  . insulin glargine  16 Units Subcutaneous BID  . levofloxacin (LEVAQUIN) IV  250 mg Intravenous Q24H  . levothyroxine  100 mcg Oral QAC breakfast  . metoprolol succinate  50 mg Oral Daily  . pantoprazole  40 mg Oral Daily  . [COMPLETED] predniSONE  10 mg Oral Once  . [COMPLETED] warfarin  2.5 mg Oral ONCE-1800  . Warfarin - Pharmacist Dosing Inpatient   Does not apply q1800  . [DISCONTINUED] albuterol  2.5 mg  Nebulization BID  . [DISCONTINUED] ipratropium  0.5 mg Nebulization BID    Assessment: 79yo female with AFib admitted with supratherapeutic INR on home dose and ultimately required Vit K as INR continued to rise.  Now Coumadin resumed and INR approaching goal.  Hg 7.6 this AM.  No bleeding problems noted.  Goal of Therapy:  INR 2-3 Monitor platelets by anticoagulation protocol: Yes   Plan:  1.  Coumadin 2.5mg  today 2.  F/U INR in AM  Marisue Humble, PharmD Clinical Pharmacist [facility]

## 2012-08-12 DIAGNOSIS — I5042 Chronic combined systolic (congestive) and diastolic (congestive) heart failure: Secondary | ICD-10-CM | POA: Diagnosis present

## 2012-08-12 DIAGNOSIS — E118 Type 2 diabetes mellitus with unspecified complications: Secondary | ICD-10-CM | POA: Diagnosis present

## 2012-08-12 DIAGNOSIS — R7881 Bacteremia: Secondary | ICD-10-CM | POA: Diagnosis present

## 2012-08-12 DIAGNOSIS — Z951 Presence of aortocoronary bypass graft: Secondary | ICD-10-CM

## 2012-08-12 DIAGNOSIS — N189 Chronic kidney disease, unspecified: Secondary | ICD-10-CM | POA: Diagnosis present

## 2012-08-12 DIAGNOSIS — I2510 Atherosclerotic heart disease of native coronary artery without angina pectoris: Secondary | ICD-10-CM | POA: Diagnosis present

## 2012-08-12 DIAGNOSIS — Z950 Presence of cardiac pacemaker: Secondary | ICD-10-CM | POA: Diagnosis present

## 2012-08-12 LAB — BASIC METABOLIC PANEL
BUN: 29 mg/dL — ABNORMAL HIGH (ref 6–23)
GFR calc non Af Amer: 33 mL/min — ABNORMAL LOW (ref >90)
Glucose, Bld: 105 mg/dL — ABNORMAL HIGH (ref 70–99)
Potassium: 3.8 mEq/L (ref 3.5–5.1)

## 2012-08-12 LAB — TYPE AND SCREEN
ABO/RH(D): A POS
Antibody Screen: POSITIVE
Donor AG Type: NEGATIVE
Unit division: 0

## 2012-08-12 LAB — CBC
HCT: 28.5 % — ABNORMAL LOW (ref 36.0–46.0)
Hemoglobin: 9.4 g/dL — ABNORMAL LOW (ref 12.0–15.0)
MCH: 29.2 pg (ref 26.0–34.0)
MCHC: 33 g/dL (ref 30.0–36.0)

## 2012-08-12 LAB — GLUCOSE, CAPILLARY: Glucose-Capillary: 119 mg/dL — ABNORMAL HIGH (ref 70–99)

## 2012-08-12 MED ORDER — LEVOFLOXACIN 500 MG PO TABS: 500.0000 mg | ORAL_TABLET | Freq: Every day | ORAL | Status: AC

## 2012-08-12 MED ORDER — WARFARIN SODIUM 5 MG PO TABS: 5.0000 mg | ORAL_TABLET | Freq: Every day | ORAL | Status: DC

## 2012-08-12 MED ORDER — BENZONATATE 200 MG PO CAPS: 200.0000 mg | ORAL_CAPSULE | Freq: Three times a day (TID) | ORAL | Status: DC | PRN

## 2012-08-12 MED ORDER — FUROSEMIDE 40 MG PO TABS: 20.0000 mg | ORAL_TABLET | Freq: Every day | ORAL | Status: DC

## 2012-08-12 MED ORDER — ALBUTEROL SULFATE HFA 108 (90 BASE) MCG/ACT IN AERS: 2.0000 | INHALATION_SPRAY | Freq: Four times a day (QID) | RESPIRATORY_TRACT | Status: DC | PRN

## 2012-08-12 MED ORDER — GUAIFENESIN-DM 100-10 MG/5ML PO SYRP: 5.0000 mL | ORAL_SOLUTION | ORAL | Status: DC | PRN

## 2012-08-12 MED ORDER — WARFARIN SODIUM 4 MG PO TABS
4.0000 mg | ORAL_TABLET | Freq: Once | ORAL | Status: DC
  Filled 2012-08-12: qty 1

## 2012-08-12 MED ORDER — LEVOFLOXACIN 500 MG PO TABS: 500.0000 mg | ORAL_TABLET | Freq: Every day | ORAL | Status: DC

## 2012-08-12 NOTE — Progress Notes (Signed)
ANTICOAGULATION CONSULT NOTE - Follow Up Consult  Pharmacy Consult for Coumadin Indication: atrial fibrillation  Allergies  Allergen Reactions  . Betadine (Povidone Iodine) Itching  . Capoten (Captopril) Other (See Comments)    unknown  . Codone (Hydrocodone) Nausea And Vomiting  . Methadone Nausea And Vomiting  . Penicillins Swelling  . Propoxyphene And Methadone     Intolerance to darvocet   Labs:  Recent Labs  08/10/12 0500 08/11/12 0545 08/11/12 1655 08/12/12 0505  HGB 7.8* 7.6* 9.1* 9.4*  HCT 24.1* 23.0* 27.1* 28.5*  PLT 259 286 290 333  LABPROT 18.4* 19.7*  --  20.0*  INR 1.58* 1.73*  --  1.77*  CREATININE 1.97* 1.63*  --  1.46*    Estimated Creatinine Clearance: 35.9 ml/min (by C-G formula based on Cr of 1.46).  Assessment: 79yo female with AFib admitted with supratherapeutic INR on home dose and ultimately required Vit K as INR continued to rise.  Now Coumadin resumed and INR approaching goal.  No bleeding problems noted.  Goal of Therapy:  INR 2-3 Monitor platelets by anticoagulation protocol: Yes   Plan:  1.  Coumadin 4 mg today 2.  If discharged today, recommend Coumadin 5 mg daily 3.  F/U INR in AM  Thank you. Okey Regal, PharmD [phone]

## 2012-08-12 NOTE — Discharge Summary (Signed)
DISCHARGE SUMMARY  TALENA NEIRA  MR#: [id number]  DOB:[birth year]  Date of Admission: 08/06/2012 Date of Discharge: 08/12/2012  Attending Physician:SHAW,W DOUGLAS  Patient's HQI:ONGEX,BMWU M, MD  Consults: None indicated  Discharge Diagnoses: Active Problems:  Right Lower Lobe Pneumonia  Bacteremia due to Escherichia coli  Acute on chronic renal failure  Anemia associated with chronic renal failure requiring transfusion  Fever  Generalized Weakness  Atrial fibrillation on anticoagulation with supratherapeutic INR  Hypothyroidism  CAD (coronary artery disease)  CKD (chronic kidney disease) stage 3, GFR 30-59 ml/min  Coronary artery disease with Hx of MI, Hx of CABG  Chronic combined systolic and diastolic congestive heart failure (EF 30%--> 45-50% in 2/14)  Diabetes mellitus type 2 with complications  s/p recent pacemaker placement  Past Medical History  Diagnosis Date  . Coronary artery disease     a. Severe two vessel; s/p CABG;  b. 05/2012 NSTEMI in setting of rapid aflutter;  c. 06/2012 Lexiscan cardiolite: small, mild reversible defect in lateral wall->mild ischemia, low-risk->med Rx.  Marland Kitchen Hypertension   . Type II diabetes mellitus   . Hyperlipidemia   . Diabetic neuropathy   . CVA (cerebral vascular accident) 2004  . Hypothyroidism   . Chronic anemia   . GERD (gastroesophageal reflux disease)   . Arthritis   . Memory loss, short term   . COPD (chronic obstructive pulmonary disease)   . Blood transfusion without reported diagnosis   . Chronic combined systolic and diastolic CHF (congestive heart failure)     a. 07/2012 Echo: EF 30%, Gr II DD, Mild AS/MR, Mod-Sev TR, mild bi-atrial and RV dil, PASP .  . Moderate mitral regurgitation     a. mod by TEE 2012, mild by echo 2013, 07/2012  . Severe tricuspid regurgitation     a. Severe by TEE 2012, mod by echo 2013, mod-sev by echo 07/2012  . Chronic vulvitis 07/1993  . Idiopathic anemia 2006  . Atrial fibrillation      a. Failed amiodarone (prolonged QT), not a candidate for class IC meds due to her CAD. No Multaq due to CHF hx;  b. 07/2012 s/p SJM Anthem RF Bi-V PPM, ser # [id number] and AV node RFCA;  b. chronic coumadin.  . Atrial flutter     Discharge Medications:   Medication List    TAKE these medications       ACETAMINOPHEN PO  Take 2 tablets by mouth every 4 (four) hours as needed. For pain     amitriptyline 25 MG tablet  Commonly known as:  ELAVIL  Take 75 mg by mouth at bedtime.     aspirin EC 81 MG tablet  Take 81 mg by mouth daily.     atorvastatin 40 MG tablet  Commonly known as:  LIPITOR  Take 40 mg by mouth every evening.     benzonatate 200 MG capsule  Commonly known as:  TESSALON  Take 1 capsule (200 mg total) by mouth 3 (three) times daily as needed for cough.     diltiazem 240 MG 24 hr capsule  Commonly known as:  CARDIZEM CD  Take 1 capsule (240 mg total) by mouth daily.     donepezil 5 MG tablet  Commonly known as:  ARICEPT  Take 5 mg by mouth at bedtime.     esomeprazole 40 MG capsule  Commonly known as:  NEXIUM  Take 40 mg by mouth daily before breakfast.     furosemide 40 MG tablet  Commonly known as:  LASIX  Take 0.5 tablets (20 mg total) by mouth daily. 40 mg in the am and 20 mg evening     guaiFENesin-dextromethorphan 100-10 MG/5ML syrup  Commonly known as:  ROBITUSSIN DM  Take 5 mLs by mouth every 4 (four) hours as needed for cough.     ICAPS AREDS FORMULA PO  Take 1 tablet by mouth daily.     insulin glargine 100 UNIT/ML injection  Commonly known as:  LANTUS  Inject 35 Units into the skin 2 (two) times daily.     insulin lispro 100 UNIT/ML injection  Commonly known as:  HUMALOG  Inject 0-20 Units into the skin 3 (three) times daily as needed. Home sliding scale  If blood sugar is <200 take 15 units  If blood sugar is >200 take 20 units  If blood sugar is less than 70, don't take any     levofloxacin 500 MG tablet  Commonly known as:   LEVAQUIN  Take 1 tablet (500 mg total) by mouth daily.     levothyroxine 100 MCG tablet  Commonly known as:  SYNTHROID, LEVOTHROID  Take 100 mcg by mouth daily.     metoprolol succinate 50 MG 24 hr tablet  Commonly known as:  TOPROL-XL  Take 50 mg by mouth daily. Take with or immediately following a meal.     nitroGLYCERIN 0.4 MG SL tablet  Commonly known as:  NITROSTAT  Place 0.4 mg under the tongue every 5 (five) minutes as needed. For chest pain     potassium chloride SA 20 MEQ tablet  Commonly known as:  K-DUR,KLOR-CON  Take 10 mEq by mouth daily.     warfarin 5 MG tablet  Commonly known as:  COUMADIN  Take 1 tablet (5 mg total) by mouth daily. Take 5mg  daily or as directed      Albuterol HFA 2 puffs every 6 hours as needed for wheezing, cough  Hospital Procedures: Dg Chest 2 View  08/09/2012  *RADIOLOGY REPORT*  Clinical Data: Follow-up pneumonia  CHEST - 2 VIEW  Comparison: 08/07/2012  Findings: The patient is status post median sternotomy.  Multi lead pacer is in place via a left subclavian approach with lead tips stable in position. A right subclavian CVP is unchanged in position.  Low lung volumes are noted.  Persistent cardiomegaly is seen and is stable in degree.  There has been interval improvement in aeration at the right base with some persistent focal density compatible with improving right lower lobe pneumonia.  No pleural fluid is seen.  Some pulmonary vascular congestion with no signs of overt congestive failure are evident. A small right pleural effusion is again suggested.  No new areas of focal infiltrate are seen  Bony structures appear intact  IMPRESSION: Improving right lower lobe infiltrate.  Persistent small right pleural effusion.   Original Report Authenticated By: Rhodia Albright, M.D.    X-ray Chest Pa And Lateral   08/07/2012  *RADIOLOGY REPORT*  Clinical Data: Status post pacemaker insertion.  Cough and cold symptoms.  CHEST - 2 VIEW  Comparison:  07/30/2012  Findings: Right lung base opacity has increased from the prior study partly silhouetting the right hemidiaphragm, consistent with pneumonia in the proper clinical setting.  The lungs are otherwise clear.  The cardiac silhouette is mildly enlarged.  Changes from previous cardiac surgery are stable.  The left anterior chest wall sequential pacemaker is also stable with its leads projecting within the right atrium and right ventricle.  IMPRESSION: Increased right  lung base opacity consistent with an evolving infiltrate.   Original Report Authenticated By: Amie Portland, M.D.    Ir Fluoro Guide Cv Line Right  08/06/2012  *RADIOLOGY REPORT*  PICC PLACEMENT WITH ULTRASOUND AND FLUOROSCOPIC  GUIDANCE  Clinical History: Very poor intravenous access, requires IV antibiotics.  Fluoroscopy Time: One minutes.  Procedure:  The right arm was prepped with chlorhexidine, draped in the usual sterile fashion using maximum barrier technique (cap and mask, sterile gown, sterile gloves, large sterile sheet, hand hygiene and cutaneous antiseptic).  Local anesthesia was attained by infiltration with 1% lidocaine.  Ultrasound demonstrated patency of the right brachial vein, and this was documented with an image.  Under real-time ultrasound guidance, this vein was accessed with a 21 gauge micropuncture needle and image documentation was performed.  The needle was exchanged over a guidewire for a peel-away sheath through which a 39 cm 5 [place] dual lumen power injectable PICC was advanced, and positioned with its tip at the lower SVC/right atrial junction. Fluoroscopy during the procedure and fluoro spot radiograph confirms appropriate catheter position.  The catheter was flushed, secured to the skin with Prolene sutures, and covered with a sterile dressing.  Complications:  None.  The patient tolerated the procedure well.  IMPRESSION:  Successful placement of a  right brachial vein dual lumen power PICC with sonographic and  fluoroscopic guidance.  The catheter is ready for use.  Signed,  Sterling Big, MD Vascular & Interventional Radiologist [facility]   Original Report Authenticated By: Malachy Moan, M.D.    Ir US Guide Vasc Access Right  08/06/2012  *RADIOLOGY REPORT*  PICC PLACEMENT WITH ULTRASOUND AND FLUOROSCOPIC  GUIDANCE  Clinical History: Very poor intravenous access, requires IV antibiotics.  Fluoroscopy Time: One minutes.  Procedure:  The right arm was prepped with chlorhexidine, draped in the usual sterile fashion using maximum barrier technique (cap and mask, sterile gown, sterile gloves, large sterile sheet, hand hygiene and cutaneous antiseptic).  Local anesthesia was attained by infiltration with 1% lidocaine.  Ultrasound demonstrated patency of the right brachial vein, and this was documented with an image.  Under real-time ultrasound guidance, this vein was accessed with a 21 gauge micropuncture needle and image documentation was performed.  The needle was exchanged over a guidewire for a peel-away sheath through which a 39 cm 5 [place] dual lumen power injectable PICC was advanced, and positioned with its tip at the lower SVC/right atrial junction. Fluoroscopy during the procedure and fluoro spot radiograph confirms appropriate catheter position.  The catheter was flushed, secured to the skin with Prolene sutures, and covered with a sterile dressing.  Complications:  None.  The patient tolerated the procedure well.  IMPRESSION:  Successful placement of a  right brachial vein dual lumen power PICC with sonographic and fluoroscopic guidance.  The catheter is ready for use.  Signed,  Sterling Big, MD Vascular & Interventional Radiologist [facility]   Original Report Authenticated By: Malachy Moan, M.D.    Echocardiogram (08/08/12)- LV function appears to be low normal to mildly reduced; focal wall motion abnormality cannot be excluded; aortic valve calcified with reduced cusp  excursion; mean gradient of 16 mmHg suggests mild AS; severe TR.  EF estimated at 45-50%.  History of Present Illness: Ms. Christon is a 79 year old African American female with a history of coronary artery disease status post CABG, atrial fibrillation on anticoagulation with recent pacemaker placement (1/14), who initially presented to the office on 1/31 with the complaint of fever,  rigors, and chills. Pacemaker site was evaluated and clear. Chest x-ray urinalysis were unrevealing. The following day, she called the physician on call and was prescribed Bactrim DS. As this was a weekend it was recommended that she go to urgent care or emergency room but she declined. She returned on Monday 2/3 with persistent fevers up to 102, increased shortness of breath, cough with productive cough. Evaluation in the office revealed a Blount blood count of 18,000.  She was admitted for further management.  Hospital Course: Ms. Maxim was admitted to a medical bed. Chest x-ray on admission revealed a right lower lobe infiltrate consistent with pneumonia. She was placed on Levaquin for pneumonia. Blood culture subsequently grew Escherichia coli sensitive to Cipro and gram-positive cocci. She was treated briefly with vancomycin until the cultures returned as coag-negative staph (felt to be a contaminant).  There were no signs of pacemaker pocket infection. Pacemaker interrogation and echocardiogram were performed. Echocardiogram as above showed an a significant improvement in her ejection fraction to 45-50%. Initially, her diuretics were held due to volume depletion and acute on chronic renal failure. Her creatinine on admission was 2.55 and improved to 1.46 on discharge with gentle hydration. She did develop some mild volume edema and weight gain so her Lasix was restarted at a lower dose. Her INR was supratherapeutic on admission at 5.76. She did receive a dose of vitamin K and was placed on lower dose of Coumadin. Her INR on the  day of discharge is 1.77. She developed progressive anemia. Received a dose of Aranesp and IV iron.  Despite this, her hemoglobin dropped to 7.6. She was transfused one unit packed red blood cells with stabilization of her hemoglobin to 9.4 on discharge. With this treatment, the patient feels much better with resolution of her fever and improvement in her cough. Followup chest x-ray shows improvement in her right lower lobe pneumonia.  She remains very weak and will benefit from home health physical therapy and nursing care. She will be going home with her husband who will provide 24-hour assistance.  Day of Discharge Exam BP 116/66  Pulse 75  Temp(Src) 98.2 F (36.8 C) (Oral)  Resp 20  Ht 5\' 3"  (1.6 m)  Wt 97.523 kg (215 lb)  BMI 38.09 kg/m2  SpO2 95%  Physical Exam: General appearance: alert and no distress Eyes: no scleral icterus Throat: oropharynx moist without erythema Resp: Decreased right basilar crackles Cardio: regular rate and rhythm GI: soft, non-tender; bowel sounds normal; no masses,  no organomegaly Extremities: no clubbing, cyanosis or edema  Discharge Labs:  Recent Labs  08/11/12 0545 08/12/12 0505  NA 135 138  K 4.1 3.8  CL 101 103  CO2 25 26  GLUCOSE 291* 105*  BUN 32* 29*  CREATININE 1.63* 1.46*  CALCIUM 8.2* 8.5     Recent Labs  08/11/12 1655 08/12/12 0505  WBC 11.2* 12.5*  HGB 9.1* 9.4*  HCT 27.1* 28.5*  MCV 88.3 88.5  PLT 290 333   Lab Results  Component Value Date   INR 1.77* 08/12/2012   INR 1.73* 08/11/2012   INR 1.58* 08/10/2012   No results found for this basename: TSH, T4TOTAL, FREET3, T3FREE, THYROIDAB,  in the last 72 hours  Recent Labs  08/10/12 0755  FERRITIN 199  TIBC 229*  IRON 23*    Discharge instructions:     Discharge Orders   Future Appointments Provider Department Dept Phone   08/16/2012 8:45 AM Delcie Roch [facility]  MEDICAL ONCOLOGY [phone]   08/16/2012 9:00 AM Samul Dada, MD [facility] MEDICAL ONCOLOGY [phone]   08/16/2012 10:15 AM [facility] Nurse [facility]COL[facility] [phone]   09/14/2012 11:30 AM Mauri [place] [facility] [phone]   09/14/2012 11:45 AM [facility] Nurse [facility]COL[facility] [phone]   10/30/2012 12:15 PM Marinus Maw, MD [facility] Main Office [place]) [phone]   Future Orders Complete By Expires     Diet - low sodium heart healthy  As directed     Discharge instructions  As directed     Comments:      Fall precautions- use walker, walk with assistance initially.  24 hour assistance.  Decrease Coumadin to 5mg  every day.  Check INR within 1 week at [facility].  Call if increased shortness of breath, fever above 101.5, increased weakness.    Increase activity slowly  As directed        Disposition: To home with 24 hour assistance and Home Health PT, RN  Follow-up Appts: Follow-up with Dr. Timothy Lasso at [facility] in 1 week.  Condition on Discharge: Improved/stable  Tests Needing Follow-up: INR checked within one week at [facility]  Signed: SHAW,W DOUGLAS 08/12/2012, 12:11 PM

## 2012-08-12 NOTE — Progress Notes (Signed)
Assessment unchanged. Discussed D/C instructions with pt and family including f/u appts and changes in medicine. Rx given to pt. Verbalized understanding. PICC line removed by IV team RN and bedrest for 30 mins followed. Pt leaving via W/C accompanied by NT.

## 2012-08-12 NOTE — Progress Notes (Signed)
NCM sent notification to [facility] that pt was dc home. Isidoro Donning RN CCM Case Mgmt phone [phone]

## 2012-08-14 ENCOUNTER — Encounter: Payer: Medicare Other | Admitting: Cardiology

## 2012-08-15 ENCOUNTER — Telehealth: Payer: Self-pay | Admitting: Oncology

## 2012-08-15 LAB — CULTURE, BLOOD (ROUTINE X 2): Culture: NO GROWTH

## 2012-08-15 NOTE — Telephone Encounter (Signed)
Pt husband called to r/s 2/13 appts. DM has no availability until 3/27. R/s lb/inj to 2/27 (date per husband) and lb/DM/inj 3/27. Other appts cx'd. Pt husband has new appt d/t's. Message to desk nurse re changes.

## 2012-08-16 ENCOUNTER — Other Ambulatory Visit: Payer: Self-pay | Admitting: Lab

## 2012-08-16 ENCOUNTER — Ambulatory Visit: Payer: Self-pay | Admitting: Oncology

## 2012-08-16 ENCOUNTER — Ambulatory Visit: Payer: Self-pay

## 2012-08-17 ENCOUNTER — Ambulatory Visit: Payer: Self-pay | Admitting: Oncology

## 2012-08-17 ENCOUNTER — Other Ambulatory Visit: Payer: BC Managed Care – PPO | Admitting: Lab

## 2012-08-17 ENCOUNTER — Ambulatory Visit: Payer: BC Managed Care – PPO

## 2012-08-21 ENCOUNTER — Ambulatory Visit (INDEPENDENT_AMBULATORY_CARE_PROVIDER_SITE_OTHER): Payer: Medicare Other | Admitting: *Deleted

## 2012-08-21 DIAGNOSIS — I4891 Unspecified atrial fibrillation: Secondary | ICD-10-CM

## 2012-08-21 DIAGNOSIS — Z7901 Long term (current) use of anticoagulants: Secondary | ICD-10-CM

## 2012-08-24 ENCOUNTER — Ambulatory Visit (INDEPENDENT_AMBULATORY_CARE_PROVIDER_SITE_OTHER): Payer: Medicare Other | Admitting: Nurse Practitioner

## 2012-08-24 ENCOUNTER — Ambulatory Visit (INDEPENDENT_AMBULATORY_CARE_PROVIDER_SITE_OTHER): Payer: Medicare Other | Admitting: *Deleted

## 2012-08-24 ENCOUNTER — Encounter: Payer: Self-pay | Admitting: Nurse Practitioner

## 2012-08-24 VITALS — BP 110/84 | HR 89 | Ht 63.0 in | Wt 203.5 lb

## 2012-08-24 DIAGNOSIS — I5042 Chronic combined systolic (congestive) and diastolic (congestive) heart failure: Secondary | ICD-10-CM

## 2012-08-24 DIAGNOSIS — I4891 Unspecified atrial fibrillation: Secondary | ICD-10-CM

## 2012-08-24 DIAGNOSIS — I509 Heart failure, unspecified: Secondary | ICD-10-CM

## 2012-08-24 DIAGNOSIS — Z950 Presence of cardiac pacemaker: Secondary | ICD-10-CM

## 2012-08-24 DIAGNOSIS — I4892 Unspecified atrial flutter: Secondary | ICD-10-CM

## 2012-08-24 NOTE — Progress Notes (Addendum)
[name] Date of Birth: 08/10/1932 Medical Record #[id number]  History of Present Illness: Ms. Barrantes is seen back today for a follow up visit. She is seen for Dr. [place]il[place]. She has multiple medical issues which are as outlined below. She was admitted last month with atrial fib and CHF. EF was 30%. She is not felt to be a candidate for antiarrhythmics due to her multiple co-morbidities and BiV pacemaker was implanted followed by AV nodal ablation.   She was seen at the end of January for a post hospital visit. Was doing ok at that time. Did have a pacer check. Rate was turned to 80. Two weeks ago, she was subsequently readmitted with a right lower lobe pneumonia and bacteremia with E coli. Discharged 08/12/2012. She was more anemic and required transfusion. She was treated with antibiotics. An echo was done again - EF improved to 45 to 50%. She did have worsening renal failure. Pacemaker pocket remained stable.   She comes in today. She is here with her husband. Still a little weak but doing ok. Complaining mostly of her feet hurting. Her cardiac status seems stable. No chest pain. Not really short of breath. Weights have been stable. Tolerating her medicines. Husband is asking about her pacemaker rate and what her heart rate should be. She has had follow up already with Dr. Timothy Lasso with a CXR and repeat labs.   Current Outpatient Prescriptions on File Prior to Visit  Medication Sig Dispense Refill  . ACETAMINOPHEN PO Take 2 tablets by mouth every 4 (four) hours as needed. For pain      . albuterol (PROVENTIL HFA;VENTOLIN HFA) 108 (90 BASE) MCG/ACT inhaler Inhale 2 puffs into the lungs every 6 (six) hours as needed for wheezing.  1 Inhaler  2  . amitriptyline (ELAVIL) 25 MG tablet Take 75 mg by mouth at bedtime.        Marland Kitchen aspirin EC 81 MG tablet Take 81 mg by mouth daily.      Marland Kitchen atorvastatin (LIPITOR) 40 MG tablet Take 40 mg by mouth every evening.       . benzonatate (TESSALON) 200 MG capsule  Take 1 capsule (200 mg total) by mouth 3 (three) times daily as needed for cough.  90 capsule  1  . diltiazem (CARDIZEM CD) 240 MG 24 hr capsule Take 1 capsule (240 mg total) by mouth daily.  30 capsule  6  . donepezil (ARICEPT) 5 MG tablet Take 5 mg by mouth at bedtime.      Marland Kitchen esomeprazole (NEXIUM) 40 MG capsule Take 40 mg by mouth daily before breakfast.        . guaiFENesin-dextromethorphan (ROBITUSSIN DM) 100-10 MG/5ML syrup Take 5 mLs by mouth every 4 (four) hours as needed for cough.  118 mL    . insulin glargine (LANTUS) 100 UNIT/ML injection Inject 35 Units into the skin 2 (two) times daily.      . insulin lispro (HUMALOG) 100 UNIT/ML injection Inject 0-20 Units into the skin 3 (three) times daily as needed. Home sliding scale If blood sugar is <200 take 15 units If blood sugar is >200 take 20 units If blood sugar is less than 70, don't take any      . levothyroxine (SYNTHROID, LEVOTHROID) 100 MCG tablet Take 100 mcg by mouth daily.      . metoprolol succinate (TOPROL-XL) 50 MG 24 hr tablet Take 50 mg by mouth daily. Take with or immediately following a meal.      .  nitroGLYCERIN (NITROSTAT) 0.4 MG SL tablet Place 0.4 mg under the tongue every 5 (five) minutes as needed. For chest pain      . potassium chloride SA (K-DUR,KLOR-CON) 20 MEQ tablet Take 10 mEq by mouth daily.      Marland Kitchen warfarin (COUMADIN) 5 MG tablet Take 1 tablet (5 mg total) by mouth daily. Take 5mg  daily or as directed  30 tablet  1  . Multiple Vitamins-Minerals (ICAPS AREDS FORMULA PO) Take 1 tablet by mouth daily.      . [DISCONTINUED] enoxaparin (LOVENOX) 100 MG/ML SOLN Inject into the skin every 12 (twelve) hours.        . [DISCONTINUED] simvastatin (ZOCOR) 80 MG tablet Take 80 mg by mouth at bedtime.         No current facility-administered medications on file prior to visit.    Allergies  Allergen Reactions  . Betadine (Povidone Iodine) Itching  . Capoten (Captopril) Other (Alexander Comments)    unknown  . Codone  (Hydrocodone) Nausea And Vomiting  . Methadone Nausea And Vomiting  . Penicillins Swelling  . Propoxyphene And Methadone     Intolerance to darvocet    Past Medical History  Diagnosis Date  . Coronary artery disease     a. Severe two vessel; s/p CABG;  b. 05/2012 NSTEMI in setting of rapid aflutter;  c. 06/2012 Lexiscan cardiolite: small, mild reversible defect in lateral wall->mild ischemia, low-risk->med Rx.  Marland Kitchen Hypertension   . Type II diabetes mellitus   . Hyperlipidemia   . Diabetic neuropathy   . CVA (cerebral vascular accident) 2004  . Hypothyroidism   . Chronic anemia   . GERD (gastroesophageal reflux disease)   . Arthritis   . Memory loss, short term   . COPD (chronic obstructive pulmonary disease)   . Blood transfusion without reported diagnosis   . Chronic combined systolic and diastolic CHF (congestive heart failure)     a. 07/2012 Echo: EF 30%, Gr II DD, Mild AS/MR, Mod-Sev TR, mild bi-atrial and RV dil, PASP .  . Moderate mitral regurgitation     a. mod by TEE 2012, mild by echo 2013, 07/2012  . Severe tricuspid regurgitation     a. Severe by TEE 2012, mod by echo 2013, mod-sev by echo 07/2012  . Chronic vulvitis 07/1993  . Idiopathic anemia 2006  . Atrial fibrillation     a. Failed amiodarone (prolonged QT), not a candidate for class IC meds due to her CAD. No Multaq due to CHF hx;  b. 07/2012 s/p SJM Anthem RF Bi-V PPM, ser # [id number] and AV node RFCA;  b. chronic coumadin.  . Atrial flutter     Past Surgical History  Procedure Laterality Date  . Coronary artery bypass graft  06/2009    X3, LIMA to LAD, SVG to diagonal, SVG to the posterior descending artery.  . Tonsillectomy    . Knee arthroscopy  08/2001  . Transthoracic echocardiogram  11/12/2010     Ejection fraction felt to be around 50%.  Wall thickness was increased in a pattern of mild LVH.  Moderately dilated left atrium.  Mildly dilated right atrium. Right ventricle was mildly dilated with mildly  reduced systolic function  . Cardiac catheterization  06/09/2009    inferior wall hypokinesia with ejection fraction of 55%.  . Abdominal hysterectomy    . Joint replacement    . Tee without cardioversion  05/23/2011    Procedure: TRANSESOPHAGEAL ECHOCARDIOGRAM (TEE);  Surgeon: Lewayne Bunting, MD;  Location:  [facility];  Service: Cardiovascular;  Laterality: N/A;  . Incision and drainage abscess  05/07/2012    Procedure: INCISION AND DRAINAGE ABSCESS;  Surgeon: Ardeth Sportsman, MD;  Location: [facility] OR;  Service: General;  Laterality: N/A;  Groin wound  . Total abdominal hysterectomy  1979  . Breast biopsy  Left  . Bilateral salpingectomy  10/2000  . Breast biopsy  09/18/2009    fibrocystic change  . Coronary artery bypass graft  06/2009    History  Smoking status  . Former Smoker  Smokeless tobacco  . Former Neurosurgeon  . Quit date: 07/04/1972    History  Alcohol Use No    Family History  Problem Relation Age of Onset  . Colon cancer Mother   . Stroke Mother   . Hypertension Mother   . Cancer Mother     breast  . Cancer Father     colon  . Heart attack Father     x2    Review of Systems: The review of systems is per the HPI.  All other systems were reviewed and are negative.  Physical Exam: BP 110/84  Pulse 89  Ht 5\' 3"  (1.6 m)  Wt 203 lb 8 oz (92.307 kg)  BMI 36.06 kg/m2  SpO2 97% Patient is very pleasant and in no acute distress. She looks a little weak and appears chronically ill. Skin is warm and dry. Color is normal.  HEENT is unremarkable. Normocephalic/atraumatic. PERRL. Sclera are nonicteric. Neck is supple. No masses. No JVD. Lungs are clear. Cardiac exam shows a regular rate and rhythm. Abdomen is soft. Extremities are with pedal edema. Gait and ROM are intact. No gross neurologic deficits noted.   LABORATORY DATA: N/A  Lab Results  Component Value Date   WBC 12.5* 08/12/2012   HGB 9.4* 08/12/2012   HCT 28.5* 08/12/2012   PLT 333 08/12/2012   GLUCOSE 105*  08/12/2012   CHOL 160 05/26/2012   TRIG 80 05/26/2012   HDL 29* 05/26/2012   LDLCALC 115* 05/26/2012   ALT 17 08/07/2012   AST 29 08/07/2012   NA 138 08/12/2012   K 3.8 08/12/2012   CL 103 08/12/2012   CREATININE 1.46* 08/12/2012   BUN 29* 08/12/2012   CO2 26 08/12/2012   TSH 0.722 05/26/2012   INR 4.4 08/21/2012   HGBA1C 10.6* 05/26/2012   Study Conclusions  - Left ventricle: The cavity size was normal. Wall thickness was normal. Systolic function was mildly reduced. The estimated ejection fraction was in the range of 45% to 50%. Doppler parameters are consistent with abnormal left ventricular relaxation (grade 1 diastolic dysfunction). - Aortic valve: Valve mobility was restricted. There was mild stenosis. Valve area: 0.97cm^2(VTI). - Mitral valve: Calcified annulus. Mild regurgitation. - Left atrium: The atrium was moderately dilated. - Right atrium: The atrium was moderately dilated. - Tricuspid valve: Wide-open regurgitation. - Pulmonary arteries: PA peak pressure: 33mm Hg (S).    Assessment / Plan:  1. Recent pneumonia with bacteremia with E coli - improving clinically. Plan per Dr. Timothy Lasso.  2. Systolic and diastolic heart failure - looks pretty well compensated.   3. Recent BiV pacemaker implant - will check with the device clinic as to when she should be checked/turned down, etc. I have talked with Baxter Hire here in our office. Looks like she needs to get rechecked and turned down to a rate of 70 today. Seeing Dr. Ladona Ridgel in follow up in April.   4. Atrial fib - s/p BiV pacer  and AV nodal ablation - remains on chronic coumadin  5. HTN - BP looks good.   Overall she is doing ok. We will Alexander her in 6 weeks.   Patient is agreeable to this plan and will call if any problems develop in the interim.   Addendum: Her device check looked like she was in atrial flutter. Dr. Johney Frame has reviewed her last CXR and felt that it was ok (in light of the elevated threshold). Baxter Hire will review the  device check with Dr. Ladona Ridgel.

## 2012-08-24 NOTE — Patient Instructions (Addendum)
Stay on your current medicines.   We will see you back in 6 weeks (with me on day that Dr. Swaziland is here)  Increase activities as tolerated  Patient is agreeable to this plan and will call if any problems develop in the interim.

## 2012-08-28 ENCOUNTER — Ambulatory Visit (INDEPENDENT_AMBULATORY_CARE_PROVIDER_SITE_OTHER): Payer: Medicare Other

## 2012-08-28 DIAGNOSIS — I4891 Unspecified atrial fibrillation: Secondary | ICD-10-CM

## 2012-08-28 DIAGNOSIS — Z7901 Long term (current) use of anticoagulants: Secondary | ICD-10-CM

## 2012-08-30 ENCOUNTER — Other Ambulatory Visit (HOSPITAL_BASED_OUTPATIENT_CLINIC_OR_DEPARTMENT_OTHER): Payer: Medicare Other | Admitting: Lab

## 2012-08-30 ENCOUNTER — Encounter: Payer: Self-pay | Admitting: Internal Medicine

## 2012-08-30 ENCOUNTER — Ambulatory Visit (HOSPITAL_BASED_OUTPATIENT_CLINIC_OR_DEPARTMENT_OTHER): Payer: Medicare Other

## 2012-08-30 VITALS — BP 141/65 | HR 84 | Temp 98.3°F

## 2012-08-30 DIAGNOSIS — D472 Monoclonal gammopathy: Secondary | ICD-10-CM

## 2012-08-30 DIAGNOSIS — D631 Anemia in chronic kidney disease: Secondary | ICD-10-CM

## 2012-08-30 DIAGNOSIS — N189 Chronic kidney disease, unspecified: Secondary | ICD-10-CM

## 2012-08-30 DIAGNOSIS — D539 Nutritional anemia, unspecified: Secondary | ICD-10-CM

## 2012-08-30 DIAGNOSIS — N039 Chronic nephritic syndrome with unspecified morphologic changes: Secondary | ICD-10-CM

## 2012-08-30 LAB — IRON AND TIBC
Iron: 75 ug/dL (ref 42–145)
TIBC: 257 ug/dL (ref 250–470)
UIBC: 182 ug/dL (ref 125–400)

## 2012-08-30 LAB — COMPREHENSIVE METABOLIC PANEL (CC13)
Albumin: 2.7 g/dL — ABNORMAL LOW (ref 3.5–5.0)
BUN: 18.5 mg/dL (ref 7.0–26.0)
CO2: 28 mEq/L (ref 22–29)
Calcium: 8.9 mg/dL (ref 8.4–10.4)
Chloride: 105 mEq/L (ref 98–107)
Glucose: 113 mg/dl — ABNORMAL HIGH (ref 70–99)
Potassium: 4 mEq/L (ref 3.5–5.1)
Total Protein: 7.5 g/dL (ref 6.4–8.3)

## 2012-08-30 LAB — CBC WITH DIFFERENTIAL/PLATELET
Eosinophils Absolute: 0.3 10*3/uL (ref 0.0–0.5)
LYMPH%: 28.7 % (ref 14.0–49.7)
MONO#: 0.7 10*3/uL (ref 0.1–0.9)
NEUT#: 3.1 10*3/uL (ref 1.5–6.5)
Platelets: 220 10*3/uL (ref 145–400)
RBC: 3.36 10*6/uL — ABNORMAL LOW (ref 3.70–5.45)
RDW: 17.1 % — ABNORMAL HIGH (ref 11.2–14.5)
WBC: 5.9 10*3/uL (ref 3.9–10.3)
lymph#: 1.7 10*3/uL (ref 0.9–3.3)

## 2012-08-30 LAB — VITAMIN B12: Vitamin B-12: 1187 pg/mL — ABNORMAL HIGH (ref 211–911)

## 2012-08-30 LAB — PACEMAKER DEVICE OBSERVATION
AL IMPEDENCE PM: 390 Ohm
DEVICE MODEL PM: 2853392
LV LEAD IMPEDENCE PM: 460 Ohm
RV LEAD AMPLITUDE: 5.8 mv
RV LEAD IMPEDENCE PM: 560 Ohm
RV LEAD THRESHOLD: 0.75 V
VENTRICULAR PACING PM: 99

## 2012-08-30 LAB — FERRITIN: Ferritin: 283 ng/mL (ref 10–291)

## 2012-08-30 MED ORDER — DARBEPOETIN ALFA-POLYSORBATE 200 MCG/0.4ML IJ SOLN
200.0000 ug | Freq: Once | INTRAMUSCULAR | Status: AC
  Administered 2012-08-30: 200 ug via SUBCUTANEOUS
  Filled 2012-08-30: qty 0.4

## 2012-09-03 ENCOUNTER — Telehealth: Payer: Self-pay | Admitting: Cardiology

## 2012-09-03 DIAGNOSIS — I509 Heart failure, unspecified: Secondary | ICD-10-CM

## 2012-09-03 NOTE — Telephone Encounter (Signed)
New problem    C/O weight gain 5 lbs over a week .

## 2012-09-03 NOTE — Telephone Encounter (Signed)
Discussed with Dr.McAlhany (DOD) and he ordered to increase Lasix to 40mg  every day and check BMET  On 3/7 and follow up with Norma Fredrickson NP on 3/12. Patient and her husband verbalized understanding. They will call us if she has any more problems.

## 2012-09-03 NOTE — Telephone Encounter (Signed)
Spoke with patient's home health nurse. She states that when she saw her on Friday her feet and legs were puffy and she had gained 5 pounds in 1 week. BP was 150/80 with HR of 72. Her home health nurse is requesting that we increase her dose of Lasix to 40mg  every day Currently taking Lasix with KCL every day. She called patient on the phone today who advised her that she was still having some lower extremity edema but did not weigh today.  Her last day of follow up for [facility] care was actually on Friday. Advised will discuss with DOD and call patient with follow up.

## 2012-09-05 ENCOUNTER — Telehealth: Payer: Self-pay | Admitting: Cardiology

## 2012-09-05 NOTE — Telephone Encounter (Signed)
New problem     Daughter wants to know Did Dr. Swaziland Can the dosage of lasix   .

## 2012-09-05 NOTE — Telephone Encounter (Signed)
Spoke to patient's daughter she wanted to verify patient's lasix dose.Daughter was told lasix increased to 40 mg daily on 09/03/12 by DOD Dr.McAlhany.Home Health nurse called to report patient had gained 5 lbs in 1 week and puffiness in legs and feet.Advised to keep appointment 09/07/12 for bmet and appointment with Norma Fredrickson NP 09/12/12.

## 2012-09-07 ENCOUNTER — Other Ambulatory Visit: Payer: Medicare Other

## 2012-09-11 ENCOUNTER — Ambulatory Visit (INDEPENDENT_AMBULATORY_CARE_PROVIDER_SITE_OTHER): Payer: Medicare Other | Admitting: *Deleted

## 2012-09-11 ENCOUNTER — Other Ambulatory Visit (INDEPENDENT_AMBULATORY_CARE_PROVIDER_SITE_OTHER): Payer: Medicare Other

## 2012-09-11 DIAGNOSIS — I4891 Unspecified atrial fibrillation: Secondary | ICD-10-CM

## 2012-09-11 DIAGNOSIS — Z7901 Long term (current) use of anticoagulants: Secondary | ICD-10-CM

## 2012-09-11 DIAGNOSIS — I509 Heart failure, unspecified: Secondary | ICD-10-CM

## 2012-09-11 LAB — BASIC METABOLIC PANEL
CO2: 26 mEq/L (ref 19–32)
Chloride: 101 mEq/L (ref 96–112)
Creatinine, Ser: 1.6 mg/dL — ABNORMAL HIGH (ref 0.4–1.2)
Potassium: 4 mEq/L (ref 3.5–5.1)

## 2012-09-11 LAB — POCT INR: INR: 1.3

## 2012-09-12 ENCOUNTER — Telehealth: Payer: Self-pay | Admitting: Internal Medicine

## 2012-09-12 ENCOUNTER — Encounter: Payer: Self-pay | Admitting: Nurse Practitioner

## 2012-09-12 ENCOUNTER — Ambulatory Visit (INDEPENDENT_AMBULATORY_CARE_PROVIDER_SITE_OTHER): Payer: Medicare Other | Admitting: Nurse Practitioner

## 2012-09-12 VITALS — BP 138/70 | HR 72 | Ht 63.0 in | Wt 206.1 lb

## 2012-09-12 DIAGNOSIS — I509 Heart failure, unspecified: Secondary | ICD-10-CM

## 2012-09-12 DIAGNOSIS — I5042 Chronic combined systolic (congestive) and diastolic (congestive) heart failure: Secondary | ICD-10-CM

## 2012-09-12 DIAGNOSIS — Z7901 Long term (current) use of anticoagulants: Secondary | ICD-10-CM

## 2012-09-12 MED ORDER — FUROSEMIDE 40 MG PO TABS: 60.0000 mg | ORAL_TABLET | Freq: Every day | ORAL | Status: DC

## 2012-09-12 NOTE — Telephone Encounter (Signed)
Walk in pt Form " [employer]" Dropped Off By pt Sending to [facility]  To See if if they Complete This if Not [name] Send back to Me 09/12/12/KM

## 2012-09-12 NOTE — Patient Instructions (Signed)
Increase the Lasix to 1 1/2 pills a day (60 mg)  Check BMET in one week  See Dr. Swaziland and Dr. Ladona Ridgel next month as planned  Call the [facility] office at [phone] if you have any questions, problems or concerns.

## 2012-09-12 NOTE — Progress Notes (Signed)
[name] Date of Birth: 04/30/1933 Medical Record #[id number]  History of Present Illness: [name] is seen back today for a follow up check. She is seen for Dr. Sw[place]iland. She has multiple medical issues which are as outlined below. She has had atrial fib and CHF. EF is 30%. Not a candidate for antiarrhythmics due to her multiple comorbidities and BiV pacemaker implanted followed by AV nodal ablation earlier this year. This was complicated by a repeat admission for pneumonia and bacteremia. Echo was repeated and had improved with an EF up to 45 to 50%.   Seen 3 weeks ago. Was a little weak but seemed to be improving. Pacer rate was turned down to 70. Has follow up with Dr. Ladona Ridgel in April.   She comes back today. She is here with her husband. This is an early visit. Family called earlier this month to report weight gain of 5 pounds in a week. Lasix was increased. She says she is doing fine. She does not understand why she is here. Says her breathing is fine and she has no swelling but later in the visit tells me how bad her swelling is. I suspect more salt use. She is taking 40 mg of Lasix now. Labs yesterday looked ok. Some chronic renal impairment. Potassium ok. No chest pain.  She denies. Husband is more worried about her sugars. Seeing Dr. Timothy Lasso next week. Very labile glucoses. Not able to use support stockings. Sounds like she does not elevate her legs very much either.    Current Outpatient Prescriptions on File Prior to Visit  Medication Sig Dispense Refill  . ACETAMINOPHEN PO Take 2 tablets by mouth every 4 (four) hours as needed. For pain      . amitriptyline (ELAVIL) 25 MG tablet Take 75 mg by mouth at bedtime.        Marland Kitchen aspirin EC 81 MG tablet Take 81 mg by mouth daily.      Marland Kitchen atorvastatin (LIPITOR) 40 MG tablet Take 40 mg by mouth every evening.       . diltiazem (CARDIZEM CD) 240 MG 24 hr capsule Take 1 capsule (240 mg total) by mouth daily.  30 capsule  6  . donepezil  (ARICEPT) 5 MG tablet Take 5 mg by mouth at bedtime.      Marland Kitchen esomeprazole (NEXIUM) 40 MG capsule Take 40 mg by mouth daily before breakfast.        . furosemide (LASIX) 40 MG tablet Take 1 tablet (40 mg total) by mouth daily.      . insulin glargine (LANTUS) 100 UNIT/ML injection Inject 35 Units into the skin 2 (two) times daily.      . insulin lispro (HUMALOG) 100 UNIT/ML injection Inject 0-20 Units into the skin 3 (three) times daily as needed. Home sliding scale If blood sugar is <200 take 15 units If blood sugar is >200 take 20 units If blood sugar is less than 70, don't take any      . levothyroxine (SYNTHROID, LEVOTHROID) 100 MCG tablet Take 100 mcg by mouth daily.      . metoprolol succinate (TOPROL-XL) 50 MG 24 hr tablet Take 50 mg by mouth daily. Take with or immediately following a meal.      . nitroGLYCERIN (NITROSTAT) 0.4 MG SL tablet Place 0.4 mg under the tongue every 5 (five) minutes as needed. For chest pain      . potassium chloride SA (K-DUR,KLOR-CON) 20 MEQ tablet Take 10 mEq by mouth  daily.      . warfarin (COUMADIN) 5 MG tablet Take 1 tablet (5 mg total) by mouth daily. Take 5mg  daily or as directed  30 tablet  1  . Multiple Vitamins-Minerals (ICAPS AREDS FORMULA PO) Take 1 tablet by mouth daily.      . [DISCONTINUED] enoxaparin (LOVENOX) 100 MG/ML SOLN Inject into the skin every 12 (twelve) hours.        . [DISCONTINUED] simvastatin (ZOCOR) 80 MG tablet Take 80 mg by mouth at bedtime.         No current facility-administered medications on file prior to visit.    Allergies  Allergen Reactions  . Betadine (Povidone Iodine) Itching  . Capoten (Captopril) Other (Alexander Comments)    unknown  . Codone (Hydrocodone) Nausea And Vomiting  . Methadone Nausea And Vomiting  . Penicillins Swelling  . Propoxyphene And Methadone     Intolerance to darvocet    Past Medical History  Diagnosis Date  . Coronary artery disease     a. Severe two vessel; s/p CABG;  b. 05/2012 NSTEMI  in setting of rapid aflutter;  c. 06/2012 Lexiscan cardiolite: small, mild reversible defect in lateral wall->mild ischemia, low-risk->med Rx.  Marland Kitchen Hypertension   . Type II diabetes mellitus   . Hyperlipidemia   . Diabetic neuropathy   . CVA (cerebral vascular accident) 2004  . Hypothyroidism   . Chronic anemia   . GERD (gastroesophageal reflux disease)   . Arthritis   . Memory loss, short term   . COPD (chronic obstructive pulmonary disease)   . Blood transfusion without reported diagnosis   . Chronic combined systolic and diastolic CHF (congestive heart failure)     a. 07/2012 Echo: EF 30%, Gr II DD, Mild AS/MR, Mod-Sev TR, mild bi-atrial and RV dil, PASP .  . Moderate mitral regurgitation     a. mod by TEE 2012, mild by echo 2013, 07/2012  . Severe tricuspid regurgitation     a. Severe by TEE 2012, mod by echo 2013, mod-sev by echo 07/2012  . Chronic vulvitis 07/1993  . Idiopathic anemia 2006  . Atrial fibrillation     a. Failed amiodarone (prolonged QT), not a candidate for class IC meds due to her CAD. No Multaq due to CHF hx;  b. 07/2012 s/p SJM Anthem RF Bi-V PPM, ser # [id number] and AV node RFCA;  b. chronic coumadin.  . Atrial flutter     Past Surgical History  Procedure Laterality Date  . Coronary artery bypass graft  06/2009    X3, LIMA to LAD, SVG to diagonal, SVG to the posterior descending artery.  . Tonsillectomy    . Knee arthroscopy  08/2001  . Transthoracic echocardiogram  11/12/2010     Ejection fraction felt to be around 50%.  Wall thickness was increased in a pattern of mild LVH.  Moderately dilated left atrium.  Mildly dilated right atrium. Right ventricle was mildly dilated with mildly reduced systolic function  . Cardiac catheterization  06/09/2009    inferior wall hypokinesia with ejection fraction of 55%.  . Abdominal hysterectomy    . Joint replacement    . Tee without cardioversion  05/23/2011    Procedure: TRANSESOPHAGEAL ECHOCARDIOGRAM (TEE);  Surgeon:  Lewayne Bunting, MD;  Location: [facility] ENDOSCOPY;  Service: Cardiovascular;  Laterality: N/A;  . Incision and drainage abscess  05/07/2012    Procedure: INCISION AND DRAINAGE ABSCESS;  Surgeon: Ardeth Sportsman, MD;  Location: [facility] OR;  Service: General;  Laterality:  N/A;  Groin wound  . Total abdominal hysterectomy  1979  . Breast biopsy  Left  . Bilateral salpingectomy  10/2000  . Breast biopsy  09/18/2009    fibrocystic change  . Coronary artery bypass graft  06/2009    History  Smoking status  . Former Smoker  Smokeless tobacco  . Former Neurosurgeon  . Quit date: 07/04/1972    History  Alcohol Use No    Family History  Problem Relation Age of Onset  . Colon cancer Mother   . Stroke Mother   . Hypertension Mother   . Cancer Mother     breast  . Cancer Father     colon  . Heart attack Father     x2    Review of Systems: The review of systems is per the HPI.  All other systems were reviewed and are negative.  Physical Exam: BP 138/70  Pulse 72  Ht 5\' 3"  (1.6 m)  Wt 206 lb 1.9 oz (93.495 kg)  BMI 36.52 kg/m2 Patient is very pleasant and in no acute distress. She is a very poor historian. Weight is up 3 pounds. Skin is warm and dry. Color is normal.  HEENT is unremarkable. Normocephalic/atraumatic. PERRL. Sclera are nonicteric. Neck is supple. No masses. No JVD. Lungs are clear. Cardiac exam shows a regular rate and rhythm. Outflow murmur noted. Abdomen is soft. Extremities are quite full and with over 2+ edema. Gait and ROM are intact. No gross neurologic deficits noted.  LABORATORY DATA:  Lab Results  Component Value Date   WBC 5.9 08/30/2012   HGB 10.0* 08/30/2012   HCT 29.6* 08/30/2012   PLT 220 08/30/2012   GLUCOSE 140* 09/11/2012   CHOL 160 05/26/2012   TRIG 80 05/26/2012   HDL 29* 05/26/2012   LDLCALC 115* 05/26/2012   ALT 16 08/30/2012   AST 27 08/30/2012   NA 137 09/11/2012   K 4.0 09/11/2012   CL 101 09/11/2012   CREATININE 1.6* 09/11/2012   BUN 26* 09/11/2012   CO2 26  09/11/2012   TSH 0.722 05/26/2012   INR 1.3 09/11/2012   HGBA1C 10.6* 05/26/2012    Assessment / Plan:  1. Chronic systolic and diastolic heart failure - weight is up a little more. I have increased her Lasix to 60 mg. Recheck BMET with her next INR in less than 10 days. I do suspect salt use.   2. Recent BiV pacemaker implant - to [facility] Dr. Ladona Ridgel in April.   3. Atrial fib - not a candidate for antiarrhythmic therapy - now s/p AV node ablation.   4. Chronic coumadin therapy.   5. Memory disorder - seems worse to me.  She has follow up with EP and Dr. Swaziland next month. Sees Dr. Timothy Lasso next week. No other changes today. Her overall prognosis is quite tenuous.  Patient is agreeable to this plan and will call if any problems develop in the interim.   Rosalio Macadamia, RN, ANP-C  [facility] [address] [place], [place]  [address]

## 2012-09-14 ENCOUNTER — Other Ambulatory Visit: Payer: BC Managed Care – PPO | Admitting: Lab

## 2012-09-14 ENCOUNTER — Ambulatory Visit: Payer: BC Managed Care – PPO

## 2012-09-20 ENCOUNTER — Encounter: Payer: Self-pay | Admitting: Obstetrics & Gynecology

## 2012-09-20 ENCOUNTER — Ambulatory Visit: Payer: Self-pay | Admitting: Obstetrics & Gynecology

## 2012-09-20 ENCOUNTER — Ambulatory Visit (INDEPENDENT_AMBULATORY_CARE_PROVIDER_SITE_OTHER): Payer: 59 | Admitting: Obstetrics & Gynecology

## 2012-09-20 VITALS — BP 138/84 | Ht 63.0 in | Wt 203.4 lb

## 2012-09-20 DIAGNOSIS — Z01419 Encounter for gynecological examination (general) (routine) without abnormal findings: Secondary | ICD-10-CM

## 2012-09-20 NOTE — Progress Notes (Signed)
79 y.o. [id number] MarriedAfrican AmericanF here for annual exam.  Doing really well right now but had a pacemaker placed 2/14 and was then hospitalized for 5 days with pneumona.  Reports she had chest tightness when she went for her evaluation.  She was told immediately that she needed the pacemaker.  She can't believe how much better she feels.  Energy is much better.  Still seeing cardiologist/PA regularly.  She reports next appt is in April.  No LMP recorded. Patient has had a hysterectomy.          Sexually active: no  The current method of family planning is status post hysterectomy.    Exercising: yes  Home exercise routine includes physical therapy post heart surgery-walking.. Smoker:  no  Health Maintenance: Pap:  02/10/06 MMG:  Scheduled April, 2014 Colonoscopy:  2009 BMD:   2009 TDaP:  3/10   reports that she has quit smoking. Her smoking use included Cigarettes. She smoked 0.00 packs per day. She has never used smokeless tobacco. She reports that she does not drink alcohol or use illicit drugs.  Past Medical History  Diagnosis Date  . Coronary artery disease     a. Severe two vessel; s/p CABG;  b. 05/2012 NSTEMI in setting of rapid aflutter;  c. 06/2012 Lexiscan cardiolite: small, mild reversible defect in lateral wall->mild ischemia, low-risk->med Rx.  Marland Kitchen Hypertension   . Type II diabetes mellitus   . Hyperlipidemia   . Diabetic neuropathy   . CVA (cerebral vascular accident) 2004  . Hypothyroidism   . Chronic anemia   . GERD (gastroesophageal reflux disease)   . Arthritis   . Memory loss, short term   . COPD (chronic obstructive pulmonary disease)   . Blood transfusion without reported diagnosis   . Chronic combined systolic and diastolic CHF (congestive heart failure)     a. 07/2012 Echo: EF 30%, Gr II DD, Mild AS/MR, Mod-Sev TR, mild bi-atrial and RV dil, PASP .  . Moderate mitral regurgitation     a. mod by TEE 2012, mild by echo 2013, 07/2012  . Severe tricuspid  regurgitation     a. Severe by TEE 2012, mod by echo 2013, mod-sev by echo 07/2012  . Chronic vulvitis 07/1993  . Idiopathic anemia 2006  . Atrial fibrillation     a. Failed amiodarone (prolonged QT), not a candidate for class IC meds due to her CAD. No Multaq due to CHF hx;  b. 07/2012 s/p SJM Anthem RF Bi-V PPM, ser # [id number] and AV node RFCA;  b. chronic coumadin.  . Atrial flutter     Past Surgical History  Procedure Laterality Date  . Coronary artery bypass graft  06/2009    X3, LIMA to LAD, SVG to diagonal, SVG to the posterior descending artery.  . Tonsillectomy    . Knee arthroscopy  08/2001  . Transthoracic echocardiogram  11/12/2010     Ejection fraction felt to be around 50%.  Wall thickness was increased in a pattern of mild LVH.  Moderately dilated left atrium.  Mildly dilated right atrium. Right ventricle was mildly dilated with mildly reduced systolic function  . Cardiac catheterization  06/09/2009    inferior wall hypokinesia with ejection fraction of 55%.  . Abdominal hysterectomy    . Joint replacement    . Tee without cardioversion  05/23/2011    Procedure: TRANSESOPHAGEAL ECHOCARDIOGRAM (TEE);  Surgeon: Lewayne Bunting, MD;  Location: [facility] ENDOSCOPY;  Service: Cardiovascular;  Laterality: N/A;  . Incision and  drainage abscess  05/07/2012    Procedure: INCISION AND DRAINAGE ABSCESS;  Surgeon: Ardeth Sportsman, MD;  Location: [facility] OR;  Service: General;  Laterality: N/A;  Groin wound  . Total abdominal hysterectomy  1979  . Breast biopsy  Left  . Bilateral salpingectomy  10/2000  . Breast biopsy  09/18/2009    fibrocystic change  . Coronary artery bypass graft  06/2009  . Pacemaker insertion  08/2012    Current Outpatient Prescriptions  Medication Sig Dispense Refill  . ACETAMINOPHEN PO Take 2 tablets by mouth every 4 (four) hours as needed. For pain      . amitriptyline (ELAVIL) 25 MG tablet Take 75 mg by mouth at bedtime.        Marland Kitchen aspirin EC 81 MG tablet Take 81 mg by  mouth daily.      Marland Kitchen atorvastatin (LIPITOR) 40 MG tablet Take 40 mg by mouth every evening.       . diltiazem (CARDIZEM CD) 240 MG 24 hr capsule Take 1 capsule (240 mg total) by mouth daily.  30 capsule  6  . donepezil (ARICEPT) 5 MG tablet Take 5 mg by mouth at bedtime.      Marland Kitchen esomeprazole (NEXIUM) 40 MG capsule Take 40 mg by mouth daily before breakfast.        . furosemide (LASIX) 40 MG tablet Take 1.5 tablets (60 mg total) by mouth daily.  45 tablet  6  . insulin glargine (LANTUS) 100 UNIT/ML injection Inject 35 Units into the skin 2 (two) times daily.      . insulin lispro (HUMALOG) 100 UNIT/ML injection Inject 0-20 Units into the skin 3 (three) times daily as needed. Home sliding scale If blood sugar is <200 take 15 units If blood sugar is >200 take 20 units If blood sugar is less than 70, don't take any      . levothyroxine (SYNTHROID, LEVOTHROID) 100 MCG tablet Take 100 mcg by mouth daily.      . metoprolol succinate (TOPROL-XL) 50 MG 24 hr tablet Take 50 mg by mouth daily. Take with or immediately following a meal.      . Multiple Vitamins-Minerals (EYE VITAMINS PO) Take by mouth daily.      . nitroGLYCERIN (NITROSTAT) 0.4 MG SL tablet Place 0.4 mg under the tongue every 5 (five) minutes as needed. For chest pain      . potassium chloride SA (K-DUR,KLOR-CON) 20 MEQ tablet Take 10 mEq by mouth daily.      Marland Kitchen warfarin (COUMADIN) 5 MG tablet Take 1 tablet (5 mg total) by mouth daily. Take 5mg  daily or as directed  30 tablet  1  . [DISCONTINUED] enoxaparin (LOVENOX) 100 MG/ML SOLN Inject into the skin every 12 (twelve) hours.        . [DISCONTINUED] simvastatin (ZOCOR) 80 MG tablet Take 80 mg by mouth at bedtime.         No current facility-administered medications for this visit.    Family History  Problem Relation Age of Onset  . Colon cancer Mother   . Stroke Mother   . Hypertension Mother   . Cancer Mother     breast  . Cancer Father     colon  . Heart attack Father     x2  .  Renal Disease Sister     dialysis-twin sister  . Renal Disease Sister     renal bypass    ROS:  Pertinent items are noted in HPI.  Otherwise, a  comprehensive ROS was negative.  Exam:   Ht 5\' 3"  (1.6 m)  Wt 203 lb 6.4 oz (92.262 kg)  BMI 36.04 kg/m2  Height: 5\' 3"  (160 cm)  Ht Readings from Last 3 Encounters:  09/20/12 5\' 3"  (1.6 m)  09/12/12 5\' 3"  (1.6 m)  08/24/12 5\' 3"  (1.6 m)    General appearance: alert, cooperative and appears stated age Head: Normocephalic, without obvious abnormality, atraumatic Neck: no adenopathy, supple, symmetrical, trachea midline and thyroid not enlarged, symmetric, no tenderness/mass/nodules Lungs: clear to auscultation bilaterally Breasts: Inspection negative, No nipple retraction or dimpling, No nipple discharge or bleeding, No axillary or supraclavicular adenopathy, Normal to palpation without dominant masses Heart: regular rate and rhythm Abdomen: soft, non-tender; bowel sounds normal; no masses,  no organomegaly, protuberant Extremities: extremities normal, atraumatic, no cyanosis or edema Skin: Skin color, texture, turgor normal. No rashes or lesions Lymph nodes: Cervical, supraclavicular, and axillary nodes normal. No abnormal inguinal nodes palpated Neurologic: Grossly normal   Pelvic: External genitalia:  no lesions              Urethra:  normal appearing urethra with no masses, tenderness or lesions              Bartholins and Skenes: normal                 Vagina: normal appearing vagina with normal color and discharge, no lesions              Cervix: absent              Pap taken: no Bimanual Exam:  Uterus:  uterus absent              Adnexa: no masses or fullness               Rectovaginal: Confirms               Anus:  normal sphincter tone, no lesions  A:  Well Woman with normal exam  P:   mammogram return annually or prn  An After Visit Summary was printed and given to the patient.

## 2012-09-20 NOTE — Patient Instructions (Signed)
Return in one year or follow-up with any new gynecologic issues.

## 2012-09-24 ENCOUNTER — Other Ambulatory Visit (INDEPENDENT_AMBULATORY_CARE_PROVIDER_SITE_OTHER): Payer: Medicare Other

## 2012-09-24 ENCOUNTER — Ambulatory Visit (INDEPENDENT_AMBULATORY_CARE_PROVIDER_SITE_OTHER): Payer: Medicare Other | Admitting: *Deleted

## 2012-09-24 DIAGNOSIS — I5042 Chronic combined systolic (congestive) and diastolic (congestive) heart failure: Secondary | ICD-10-CM

## 2012-09-24 DIAGNOSIS — I4891 Unspecified atrial fibrillation: Secondary | ICD-10-CM

## 2012-09-24 DIAGNOSIS — R0989 Other specified symptoms and signs involving the circulatory and respiratory systems: Secondary | ICD-10-CM

## 2012-09-24 DIAGNOSIS — Z7901 Long term (current) use of anticoagulants: Secondary | ICD-10-CM

## 2012-09-24 DIAGNOSIS — I509 Heart failure, unspecified: Secondary | ICD-10-CM

## 2012-09-24 LAB — BASIC METABOLIC PANEL
CO2: 29 mEq/L (ref 19–32)
Calcium: 8.9 mg/dL (ref 8.4–10.5)
Chloride: 97 mEq/L (ref 96–112)
Glucose, Bld: 175 mg/dL — ABNORMAL HIGH (ref 70–99)
Sodium: 135 mEq/L (ref 135–145)

## 2012-09-24 LAB — POCT INR: INR: 1.4

## 2012-09-24 MED ORDER — WARFARIN SODIUM 5 MG PO TABS: ORAL_TABLET | ORAL | Status: DC

## 2012-09-27 ENCOUNTER — Encounter: Payer: Self-pay | Admitting: Oncology

## 2012-09-27 ENCOUNTER — Ambulatory Visit (HOSPITAL_BASED_OUTPATIENT_CLINIC_OR_DEPARTMENT_OTHER): Payer: Medicare Other | Admitting: Oncology

## 2012-09-27 ENCOUNTER — Ambulatory Visit: Payer: Medicare Other

## 2012-09-27 ENCOUNTER — Other Ambulatory Visit (HOSPITAL_BASED_OUTPATIENT_CLINIC_OR_DEPARTMENT_OTHER): Payer: Medicare Other | Admitting: Lab

## 2012-09-27 VITALS — BP 150/57 | HR 75 | Temp 96.7°F | Resp 20 | Ht 63.0 in | Wt 203.6 lb

## 2012-09-27 DIAGNOSIS — D472 Monoclonal gammopathy: Secondary | ICD-10-CM

## 2012-09-27 DIAGNOSIS — N189 Chronic kidney disease, unspecified: Secondary | ICD-10-CM

## 2012-09-27 DIAGNOSIS — D631 Anemia in chronic kidney disease: Secondary | ICD-10-CM

## 2012-09-27 DIAGNOSIS — D509 Iron deficiency anemia, unspecified: Secondary | ICD-10-CM

## 2012-09-27 DIAGNOSIS — N039 Chronic nephritic syndrome with unspecified morphologic changes: Secondary | ICD-10-CM

## 2012-09-27 LAB — CBC WITH DIFFERENTIAL/PLATELET
Basophils Absolute: 0 10*3/uL (ref 0.0–0.1)
EOS%: 2.7 % (ref 0.0–7.0)
Eosinophils Absolute: 0.2 10*3/uL (ref 0.0–0.5)
HCT: 36.7 % (ref 34.8–46.6)
HGB: 11.7 g/dL (ref 11.6–15.9)
LYMPH%: 29.4 % (ref 14.0–49.7)
MCH: 28.3 pg (ref 25.1–34.0)
MCV: 88.6 fL (ref 79.5–101.0)
MONO%: 10 % (ref 0.0–14.0)
NEUT#: 4.5 10*3/uL (ref 1.5–6.5)
NEUT%: 57.5 % (ref 38.4–76.8)
Platelets: 222 10*3/uL (ref 145–400)
RDW: 16.3 % — ABNORMAL HIGH (ref 11.2–14.5)

## 2012-09-27 NOTE — Progress Notes (Signed)
This office note has been dictated.  #[id number]

## 2012-09-28 NOTE — Progress Notes (Signed)
CC:   [name], MD [name], M.D.   PROBLEM LIST:  1. Anemia felt to be secondary to chronic renal disease dating back to  January 2003. This patient also has a history of iron-deficiency  anemia as well as vitamin B12 deficiency. She has received  intravenous iron in October 2004 and September of 2008. She received IV Feraheme 510 mg on 08/10/2012 while in the hospital. Currently  she is on oral vitamin B12 and Aranesp 200 mcg subcu every 4 weeks for hemoglobin less than 11.  2. IgG lambda monoclonal gammopathy of uncertain significance dating  back to November 2004.  3. Angiodysplasia with history of Hemoccult-positive stools.  4. History of elevated ANA 1:160, atypical speckled dating back to  November 2005.  5. Coronary artery disease status post CABG by Dr. Evelene Croon on  June 11, 2009.  6. Tricuspid insufficiency by TEE in October 2012.  7. History of atrial fibrillation/flutter.  8. Systolic ejection murmur.  9. Renal insufficiency.  10. History of syncope late October 2012.  11. History of acute right pontine stroke 04/09/2003.  12. Diabetes mellitus.  13. Hypertension.  14. Dyslipidemia.  15. Hypothyroidism.  16. Gastroesophageal reflux disease.  17. Osteoarthritis.  18. Right total knee replacement 12/03/2003.  19. Abdominal hernia.  20. Diverticulosis.  21. Admission for GI bleeding felt to be due to diverticulosis in April  2013.  22. Hiatal hernia. 23. Pacemaker placement on 07/18/2012 left infraclavicular area, followed by AV node ablation on 07/19/2012.   MEDICATIONS:  Reviewed and recorded. Current Outpatient Prescriptions  Medication Sig Dispense Refill  . ACETAMINOPHEN PO Take 2 tablets by mouth every 4 (four) hours as needed. For pain      . amitriptyline (ELAVIL) 25 MG tablet Take 75 mg by mouth at bedtime.        Marland Kitchen aspirin EC 81 MG tablet Take 81 mg by mouth daily.      Marland Kitchen atorvastatin (LIPITOR) 40 MG tablet Take 40 mg by mouth every  evening.       . diltiazem (CARDIZEM CD) 240 MG 24 hr capsule Take 1 capsule (240 mg total) by mouth daily.  30 capsule  6  . donepezil (ARICEPT) 5 MG tablet Take 5 mg by mouth at bedtime.      Marland Kitchen esomeprazole (NEXIUM) 40 MG capsule Take 40 mg by mouth daily before breakfast.        . furosemide (LASIX) 40 MG tablet Take 1.5 tablets (60 mg total) by mouth daily.  45 tablet  6  . insulin glargine (LANTUS) 100 UNIT/ML injection Inject 35 Units into the skin 2 (two) times daily.      . insulin lispro (HUMALOG) 100 UNIT/ML injection Inject 0-20 Units into the skin 3 (three) times daily as needed. Home sliding scale If blood sugar is <200 take 15 units If blood sugar is >200 take 20 units If blood sugar is less than 70, don't take any      . levothyroxine (SYNTHROID, LEVOTHROID) 100 MCG tablet Take 100 mcg by mouth daily.      . metoprolol succinate (TOPROL-XL) 50 MG 24 hr tablet Take 50 mg by mouth daily. Take with or immediately following a meal.      . nitroGLYCERIN (NITROSTAT) 0.4 MG SL tablet Place 0.4 mg under the tongue every 5 (five) minutes as needed. For chest pain      . potassium chloride SA (K-DUR,KLOR-CON) 20 MEQ tablet Take 10 mEq by mouth daily.      Marland Kitchen  Multiple Vitamins-Minerals (EYE VITAMINS PO) Take by mouth daily.      Marland Kitchen warfarin (COUMADIN) 5 MG tablet Take as directed by coumadin clinic  40 tablet  3  . [DISCONTINUED] enoxaparin (LOVENOX) 100 MG/ML SOLN Inject into the skin every 12 (twelve) hours.        . [DISCONTINUED] simvastatin (ZOCOR) 80 MG tablet Take 80 mg by mouth at bedtime.         No current facility-administered medications for this visit.     TREATMENT PROGRAM:  Aranesp 200 mcg subcu every 4 weeks for hemoglobin less than 11.  SMOKING HISTORY:  The patient has been essentially a nonsmoker.  HISTORY:  [name] was seen today for followup of her anemia felt to be secondary to chronic renal disease.  The patient has a multitude of medical problems.  She  was last seen by [place] on 02/23/2012.  Ms. Wence has had at least 4 admissions to the hospital since her last visit here on 02/23/2012.  She was admitted from 05/01/2012 through 05/11/2012, 05/25/2012 through 05/30/2012, 07/11/2012 through 07/26/2012, and most recently, 08/06/2012 through 08/12/2012.  Most of these admissions were cardiac-related with a rapid ventricular response to atrial fibrillation and atrial flutter.  Her rate could not be controlled pharmacologically, and the patient had a pacemaker and subsequently an AV node ablation in mid January 2014.  In February the patient was admitted with an E coli bacteremia, possibly a right lower lobe pneumonia.  She did receive Feraheme 510 mg during that hospitalization on 08/10/2012.  The patient has received intermittent Aranesp 200 mcg subcu on 02/23/2012 when her hemoglobin was 10.7, on 03/22/2012 when her hemoglobin was 10.9, on 06/22/2012 when her hemoglobin was 10.5, on 08/10/2012, when the hemoglobin was 7.8, and most recently on 08/30/2012 when the hemoglobin was 10.0.  The patient at this time seems to be doing fairly well and is without any complaints at this time.  In addition to her anemia, we also follow the patient for what appears to be a fairly stable, probably benign IgG lambda monoclonal gammopathy of uncertain significance.  PHYSICAL EXAMINATION:  The patient looks well.  She has lost about 20 pounds since her last visit and now has a weight of 203 pounds 9.6 ounces.  Height 5 feet 3 inches, body surface area 2.03 sq m.  Blood pressure 150/57.  Other vital signs are normal.  There is no scleral icterus.  Mouth and pharynx are benign.  There is no peripheral adenopathy palpable.  Lungs:  Clear to percussion and auscultation. Cardiac:  Irregular rhythm with systolic ejection murmur.  There is a pacemaker in the left infraclavicular area.  Abdomen with the patient sitting is obese, nontender with no organomegaly or  masses palpable.  An umbilical hernia has been noted in the past.  She has chronic edema of the legs with stasis changes.  Legs are quite tight and nonpitting.  She is wearing compressive stockings.  Neurologic:  Normal.  LABORATORY DATA:  CBC from 09/27/2012, today:  Mcweeney count 7.8, ANC 4.5, hemoglobin 11.7, hematocrit 36.7, platelets 222,000.  Chemistries from 08/30/2012 notable for a BUN of 18, creatinine 1.4, albumin 2.7, LDH 193, ferritin 283, iron saturation 29%.  Vitamin B12 level 1187.  IgG 1850, IgA 292, IgM 127.  IMAGING STUDIES:  1. Two-view chest x-ray on 04/06/2011 showed cardiomegaly, small  effusions and questionable mild congestion.  2. CT scan of the head without IV contrast on 04/06/2011 showed  atrophy and chronic ischemic  Boss matter disease without acute  intracranial abnormality. There was unchanged small left corona  radiata lacunar infarct. 3. A CT scan of the pelvis with IV contrast on 05/03/2012 showed focal slight cellulitis of the subcutaneous fat of the anterior aspect of the proximal right thigh.  No abscess or adenopathy.  Air was seen in the soft tissues of the anterior aspect of both thighs, probably related to subcutaneous injections.  There were multiple tiny stones in the gallbladder and some minimal arthritic changes of both hips. 4. Chest x-ray, 2 view, from 08/09/2012 showed an improving right lower lobe infiltrate and persistent small right pleural effusion.  There was a multilead pacemaker in place via the left subclavian approach with lead tips stable in position.  A right subclavian CVP was noted.  There was persistent cardiomegaly, which was stable.   PROCEDURES: The patient underwent upper endoscopy and colonoscopy on  10/24/2011. She had a 5 cm hiatal hernia, also diverticulosis, but no  active bleeding was noted.   IMPRESSION/PLAN:  Mrs. Crammer has had an eventful several months.  She seems to be markedly improved at this point in  time.  As noted, she did receive IV Feraheme 510 mg on 08/10/2012 while she was hospitalized. She has been receiving Aranesp 200 mcg subcu as needed for a hemoglobin less than 11.  Today her hemoglobin is 11.7, and she does not need Aranesp.  She will continue to have CBCs every 4 weeks and have Aranesp as indicated.  The patient's most recent ferritin on 08/30/2012 was 283.  We will plan to see the patient again in 6 months, at which time we will check CBC, chemistries, iron studies, and quantitative immunoglobulins. We will give the patient supplemental IV iron as indicated.  Extensive review of the patient's previous admissions over the past 6 months was carried out during this visit.    ______________________________ Samul Dada, M.D. DSM/MEDQ  D:  09/27/2012  T:  09/28/2012  Job:  [id number]

## 2012-10-03 ENCOUNTER — Telehealth: Payer: Self-pay | Admitting: Oncology

## 2012-10-03 NOTE — Telephone Encounter (Signed)
S/w the pt and she is aware of her lab and injection appt on 10/29/2012@10 :30am. Pt aware to pick up the rest of appt schedules when she comes in to the office.

## 2012-10-05 ENCOUNTER — Ambulatory Visit: Payer: Medicare Other | Admitting: Nurse Practitioner

## 2012-10-08 ENCOUNTER — Telehealth: Payer: Self-pay | Admitting: Internal Medicine

## 2012-10-08 ENCOUNTER — Ambulatory Visit (INDEPENDENT_AMBULATORY_CARE_PROVIDER_SITE_OTHER): Payer: Medicare Other | Admitting: Pharmacist

## 2012-10-08 DIAGNOSIS — I4891 Unspecified atrial fibrillation: Secondary | ICD-10-CM

## 2012-10-08 DIAGNOSIS — Z7901 Long term (current) use of anticoagulants: Secondary | ICD-10-CM

## 2012-10-08 LAB — POCT INR: INR: 1.6

## 2012-10-08 NOTE — Telephone Encounter (Signed)
Physicians Statement Received back [facility] does not Complete will send to [facility], I am Also mailing  Check ( made to [facility] )  back to Pt ( per Pt Ok)  10/08/12/km

## 2012-10-12 ENCOUNTER — Other Ambulatory Visit (HOSPITAL_COMMUNITY): Payer: Self-pay | Admitting: Physician Assistant

## 2012-10-17 ENCOUNTER — Ambulatory Visit (INDEPENDENT_AMBULATORY_CARE_PROVIDER_SITE_OTHER): Payer: Medicare Other | Admitting: *Deleted

## 2012-10-17 DIAGNOSIS — Z7901 Long term (current) use of anticoagulants: Secondary | ICD-10-CM

## 2012-10-17 DIAGNOSIS — I4891 Unspecified atrial fibrillation: Secondary | ICD-10-CM

## 2012-10-26 ENCOUNTER — Ambulatory Visit (INDEPENDENT_AMBULATORY_CARE_PROVIDER_SITE_OTHER): Payer: Medicare Other | Admitting: *Deleted

## 2012-10-26 DIAGNOSIS — I4891 Unspecified atrial fibrillation: Secondary | ICD-10-CM

## 2012-10-26 DIAGNOSIS — Z7901 Long term (current) use of anticoagulants: Secondary | ICD-10-CM

## 2012-10-29 ENCOUNTER — Ambulatory Visit (INDEPENDENT_AMBULATORY_CARE_PROVIDER_SITE_OTHER): Payer: Medicare Other | Admitting: Cardiology

## 2012-10-29 ENCOUNTER — Other Ambulatory Visit (HOSPITAL_BASED_OUTPATIENT_CLINIC_OR_DEPARTMENT_OTHER): Payer: Medicare Other | Admitting: Lab

## 2012-10-29 ENCOUNTER — Ambulatory Visit (HOSPITAL_BASED_OUTPATIENT_CLINIC_OR_DEPARTMENT_OTHER): Payer: Medicare Other

## 2012-10-29 ENCOUNTER — Encounter: Payer: Self-pay | Admitting: Cardiology

## 2012-10-29 VITALS — BP 134/64 | HR 75 | Ht 63.0 in | Wt 208.8 lb

## 2012-10-29 VITALS — BP 142/65 | HR 79 | Temp 98.0°F

## 2012-10-29 DIAGNOSIS — Z7901 Long term (current) use of anticoagulants: Secondary | ICD-10-CM

## 2012-10-29 DIAGNOSIS — I4891 Unspecified atrial fibrillation: Secondary | ICD-10-CM

## 2012-10-29 DIAGNOSIS — N189 Chronic kidney disease, unspecified: Secondary | ICD-10-CM

## 2012-10-29 DIAGNOSIS — I509 Heart failure, unspecified: Secondary | ICD-10-CM

## 2012-10-29 DIAGNOSIS — I5022 Chronic systolic (congestive) heart failure: Secondary | ICD-10-CM

## 2012-10-29 DIAGNOSIS — I2510 Atherosclerotic heart disease of native coronary artery without angina pectoris: Secondary | ICD-10-CM

## 2012-10-29 DIAGNOSIS — D631 Anemia in chronic kidney disease: Secondary | ICD-10-CM

## 2012-10-29 LAB — CBC WITH DIFFERENTIAL/PLATELET
Basophils Absolute: 0 10*3/uL (ref 0.0–0.1)
Eosinophils Absolute: 0.2 10*3/uL (ref 0.0–0.5)
HCT: 32.6 % — ABNORMAL LOW (ref 34.8–46.6)
HGB: 10.5 g/dL — ABNORMAL LOW (ref 11.6–15.9)
LYMPH%: 34.3 % (ref 14.0–49.7)
MCV: 88.3 fL (ref 79.5–101.0)
MONO#: 0.9 10*3/uL (ref 0.1–0.9)
MONO%: 13.2 % (ref 0.0–14.0)
NEUT#: 3.3 10*3/uL (ref 1.5–6.5)
Platelets: 200 10*3/uL (ref 145–400)
RBC: 3.69 10*6/uL — ABNORMAL LOW (ref 3.70–5.45)
WBC: 6.8 10*3/uL (ref 3.9–10.3)

## 2012-10-29 MED ORDER — DARBEPOETIN ALFA-POLYSORBATE 200 MCG/0.4ML IJ SOLN
200.0000 ug | Freq: Once | INTRAMUSCULAR | Status: AC
  Administered 2012-10-29: 200 ug via SUBCUTANEOUS
  Filled 2012-10-29: qty 0.4

## 2012-10-29 NOTE — Progress Notes (Signed)
[name] Date of Birth: 1932-07-07 Medical Record #[id number]  History of Present Illness: Ms. Landeck is seen back today for a follow up visit.  She has had atrial fib and CHF. EF was down to 30%. Not a candidate for antiarrhythmics due to her multiple comorbidities/intolerances. A BiV pacemaker was implanted followed by AV nodal ablation in December 2013. This was complicated by a repeat admission for pneumonia and bacteremia. Echo was repeated in February 2014 and had improved with an EF up to 45 to 50%.  Since her last visit she has had a very stable course. She denies any palpitations or tachycardia. Her energy level is okay. She denies any shortness of breath or chest pain. She does get tired when she walks. She is interested in getting back to the gym in a [employer] program. She denies any increase in edema and her weight has been stable.   Current Outpatient Prescriptions on File Prior to Visit  Medication Sig Dispense Refill  . ACETAMINOPHEN PO Take 2 tablets by mouth every 4 (four) hours as needed. For pain      . amitriptyline (ELAVIL) 25 MG tablet Take 75 mg by mouth at bedtime.        Marland Kitchen aspirin EC 81 MG tablet Take 81 mg by mouth daily.      Marland Kitchen atorvastatin (LIPITOR) 40 MG tablet Take 40 mg by mouth every evening.       . diltiazem (CARDIZEM CD) 240 MG 24 hr capsule Take 1 capsule (240 mg total) by mouth daily.  30 capsule  6  . donepezil (ARICEPT) 5 MG tablet Take 5 mg by mouth at bedtime.      Marland Kitchen esomeprazole (NEXIUM) 40 MG capsule Take 40 mg by mouth daily before breakfast.        . furosemide (LASIX) 40 MG tablet Take 1.5 tablets (60 mg total) by mouth daily.  45 tablet  6  . insulin glargine (LANTUS) 100 UNIT/ML injection Inject 35 Units into the skin 2 (two) times daily.      . insulin lispro (HUMALOG) 100 UNIT/ML injection Inject 0-20 Units into the skin 3 (three) times daily as needed. Home sliding scale If blood sugar is <200 take 15 units If blood sugar is  >200 take 20 units If blood sugar is less than 70, don't take any      . levothyroxine (SYNTHROID, LEVOTHROID) 100 MCG tablet Take 100 mcg by mouth daily.      . metoprolol succinate (TOPROL-XL) 50 MG 24 hr tablet Take 50 mg by mouth daily. Take with or immediately following a meal.      . Multiple Vitamins-Minerals (EYE VITAMINS PO) Take by mouth daily.      . nitroGLYCERIN (NITROSTAT) 0.4 MG SL tablet Place 0.4 mg under the tongue every 5 (five) minutes as needed. For chest pain      . potassium chloride SA (K-DUR,KLOR-CON) 20 MEQ tablet Take 10 mEq by mouth daily.      Marland Kitchen warfarin (COUMADIN) 5 MG tablet Take as directed by coumadin clinic  40 tablet  3  . [DISCONTINUED] enoxaparin (LOVENOX) 100 MG/ML SOLN Inject into the skin every 12 (twelve) hours.        . [DISCONTINUED] simvastatin (ZOCOR) 80 MG tablet Take 80 mg by mouth at bedtime.         No current facility-administered medications on file prior to visit.    Allergies  Allergen Reactions  . Betadine (Povidone Iodine) Itching  .  Capoten (Captopril) Other (See Comments)    unknown  . Codone (Hydrocodone) Nausea And Vomiting  . Methadone Nausea And Vomiting  . Penicillins Swelling  . Propoxyphene And Methadone     Intolerance to darvocet    Past Medical History  Diagnosis Date  . Coronary artery disease     a. Severe two vessel; s/p CABG;  b. 05/2012 NSTEMI in setting of rapid aflutter;  c. 06/2012 Lexiscan cardiolite: small, mild reversible defect in lateral wall->mild ischemia, low-risk->med Rx.  Marland Kitchen Hypertension   . Type II diabetes mellitus   . Hyperlipidemia   . Diabetic neuropathy   . CVA (cerebral vascular accident) 2004  . Hypothyroidism   . Chronic anemia   . GERD (gastroesophageal reflux disease)   . Arthritis   . Memory loss, short term   . COPD (chronic obstructive pulmonary disease)   . Blood transfusion without reported diagnosis   . Chronic combined systolic and diastolic CHF (congestive heart failure)      a. 07/2012 Echo: EF 30%, Gr II DD, Mild AS/MR, Mod-Sev TR, mild bi-atrial and RV dil, PASP .  . Moderate mitral regurgitation     a. mod by TEE 2012, mild by echo 2013, 07/2012  . Severe tricuspid regurgitation     a. Severe by TEE 2012, mod by echo 2013, mod-sev by echo 07/2012  . Chronic vulvitis 07/1993  . Idiopathic anemia 2006  . Atrial fibrillation     a. Failed amiodarone (prolonged QT), not a candidate for class IC meds due to her CAD. No Multaq due to CHF hx;  b. 07/2012 s/p SJM Anthem RF Bi-V PPM, ser # [id number] and AV node RFCA;  b. chronic coumadin.  . Atrial flutter     Past Surgical History  Procedure Laterality Date  . Coronary artery bypass graft  06/2009    X3, LIMA to LAD, SVG to diagonal, SVG to the posterior descending artery.  . Tonsillectomy    . Knee arthroscopy  08/2001  . Transthoracic echocardiogram  11/12/2010     Ejection fraction felt to be around 50%.  Wall thickness was increased in a pattern of mild LVH.  Moderately dilated left atrium.  Mildly dilated right atrium. Right ventricle was mildly dilated with mildly reduced systolic function  . Cardiac catheterization  06/09/2009    inferior wall hypokinesia with ejection fraction of 55%.  . Abdominal hysterectomy    . Joint replacement    . Tee without cardioversion  05/23/2011    Procedure: TRANSESOPHAGEAL ECHOCARDIOGRAM (TEE);  Surgeon: Lewayne Bunting, MD;  Location: [facility] ENDOSCOPY;  Service: Cardiovascular;  Laterality: N/A;  . Incision and drainage abscess  05/07/2012    Procedure: INCISION AND DRAINAGE ABSCESS;  Surgeon: Ardeth Sportsman, MD;  Location: [facility] OR;  Service: General;  Laterality: N/A;  Groin wound  . Total abdominal hysterectomy  1979  . Breast biopsy  Left  . Bilateral salpingectomy  10/2000  . Breast biopsy  09/18/2009    fibrocystic change  . Coronary artery bypass graft  06/2009  . Pacemaker insertion  08/2012    History  Smoking status  . Former Smoker  . Types: Cigarettes   Smokeless tobacco  . Never Used    History  Alcohol Use No    Family History  Problem Relation Age of Onset  . Colon cancer Mother   . Stroke Mother   . Hypertension Mother   . Cancer Mother     breast  . Cancer Father  colon  . Heart attack Father     x2  . Renal Disease Sister     dialysis-twin sister  . Renal Disease Sister     renal bypass    Review of Systems: The review of systems is per the HPI.  She has chronic anemia and received an Aranesp injection today. All other systems were reviewed and are negative.  Physical Exam: BP 134/64  Pulse 75  Ht 5\' 3"  (1.6 m)  Wt 208 lb 12.8 oz (94.711 kg)  BMI 37 kg/m2  SpO2 96% Patient is very pleasant and in no acute distress. She has memory loss. Weight is up 2 pounds. Skin is warm and dry. Color is normal.  HEENT is unremarkable. Normocephalic/atraumatic. PERRL. Sclera are nonicteric. Neck is supple. No masses. No JVD. Lungs are clear. Cardiac exam shows a regular rate and rhythm. There is a grade 2/6 systolic murmur heard at the left sternal border.. Abdomen is soft. Extremities are quite full and with 1- 2+ edema. Gait and ROM are intact. No gross neurologic deficits noted.  LABORATORY DATA:  Lab Results  Component Value Date   WBC 6.8 10/29/2012   HGB 10.5* 10/29/2012   HCT 32.6* 10/29/2012   PLT 200 10/29/2012   GLUCOSE 175* 09/24/2012   CHOL 160 05/26/2012   TRIG 80 05/26/2012   HDL 29* 05/26/2012   LDLCALC 115* 05/26/2012   ALT 16 08/30/2012   AST 27 08/30/2012   NA 135 09/24/2012   K 3.7 09/24/2012   CL 97 09/24/2012   CREATININE 1.5* 09/24/2012   BUN 26* 09/24/2012   CO2 29 09/24/2012   TSH 0.722 05/26/2012   INR 2.4 10/26/2012   HGBA1C 10.6* 05/26/2012    Assessment / Plan:  1. Chronic systolic and diastolic heart failure - I stressed the importance of sodium restriction. We'll continue her current Lasix dose 60 mg per day.  2. S/p BiV pacemaker implant with AV node ablation - to see Dr. Ladona Ridgel tomorrow  in the device clinic.   3. Atrial fib - not a candidate for antiarrhythmic therapy - now s/p AV node ablation. Clinically she has had a significant improvement since her AV node ablation.  4. Chronic coumadin therapy. INR was 2.4 this week.  5. Memory loss.  6. CAD status post CABG. No anginal symptoms.

## 2012-10-29 NOTE — Patient Instructions (Signed)
Continue your current therapy  Restrict your salt intake.  I will see you again in 3 months.

## 2012-10-30 ENCOUNTER — Encounter: Payer: Self-pay | Admitting: Internal Medicine

## 2012-10-30 ENCOUNTER — Ambulatory Visit (INDEPENDENT_AMBULATORY_CARE_PROVIDER_SITE_OTHER): Payer: Medicare Other | Admitting: Internal Medicine

## 2012-10-30 VITALS — BP 102/72 | Ht 63.0 in | Wt 209.0 lb

## 2012-10-30 DIAGNOSIS — I5043 Acute on chronic combined systolic (congestive) and diastolic (congestive) heart failure: Secondary | ICD-10-CM

## 2012-10-30 DIAGNOSIS — I509 Heart failure, unspecified: Secondary | ICD-10-CM

## 2012-10-30 DIAGNOSIS — Z950 Presence of cardiac pacemaker: Secondary | ICD-10-CM

## 2012-10-30 DIAGNOSIS — I5042 Chronic combined systolic (congestive) and diastolic (congestive) heart failure: Secondary | ICD-10-CM

## 2012-10-30 DIAGNOSIS — I498 Other specified cardiac arrhythmias: Secondary | ICD-10-CM

## 2012-10-30 DIAGNOSIS — I471 Supraventricular tachycardia: Secondary | ICD-10-CM

## 2012-10-30 DIAGNOSIS — I4891 Unspecified atrial fibrillation: Secondary | ICD-10-CM

## 2012-10-30 LAB — PACEMAKER DEVICE OBSERVATION
ATRIAL PACING PM: 59
BAMS-0001: 140 {beats}/min
BATTERY VOLTAGE: 2.9027 V
DEVICE MODEL PM: 2853392
LV LEAD THRESHOLD: 2.5 V

## 2012-10-30 NOTE — Patient Instructions (Addendum)
Your physician recommends that you schedule a follow-up appointment in: 3 months with Dr Taylor  

## 2012-10-30 NOTE — Progress Notes (Signed)
HPI [name] returns today for followup. She is a 79 year old woman with paroxysmal atrial fibrillation and an uncontrolled ventricular rate, status post dual-chamber pacemaker insertion, status post AV node ablation. In the interim she has been stable. She has dyspnea with exertion. She does not experience palpitations. She has severe tricuspid regurgitation at baseline. The patient underwent biventricular pacemaker insertion in January of this year. Allergies  Allergen Reactions  . Betadine (Povidone Iodine) Itching  . Capoten (Captopril) Other (See Comments)    unknown  . Codone (Hydrocodone) Nausea And Vomiting  . Methadone Nausea And Vomiting  . Penicillins Swelling  . Propoxyphene And Methadone     Intolerance to darvocet     Current Outpatient Prescriptions  Medication Sig Dispense Refill  . ACETAMINOPHEN PO Take 2 tablets by mouth every 4 (four) hours as needed. For pain      . amitriptyline (ELAVIL) 25 MG tablet Take 75 mg by mouth at bedtime.        Marland Kitchen aspirin EC 81 MG tablet Take 81 mg by mouth daily.      Marland Kitchen atorvastatin (LIPITOR) 40 MG tablet Take 40 mg by mouth every evening.       . diltiazem (CARDIZEM CD) 240 MG 24 hr capsule Take 1 capsule (240 mg total) by mouth daily.  30 capsule  6  . donepezil (ARICEPT) 5 MG tablet Take 5 mg by mouth at bedtime.      Marland Kitchen esomeprazole (NEXIUM) 40 MG capsule Take 40 mg by mouth daily before breakfast.        . furosemide (LASIX) 40 MG tablet Take 1.5 tablets (60 mg total) by mouth daily.  45 tablet  6  . insulin glargine (LANTUS) 100 UNIT/ML injection Inject 35 Units into the skin 2 (two) times daily.      . insulin lispro (HUMALOG) 100 UNIT/ML injection Inject 0-20 Units into the skin 3 (three) times daily as needed. Home sliding scale If blood sugar is <200 take 15 units If blood sugar is >200 take 20 units If blood sugar is less than 70, don't take any      . levothyroxine (SYNTHROID, LEVOTHROID) 100 MCG tablet Take 100 mcg by mouth  daily.      . metoprolol succinate (TOPROL-XL) 50 MG 24 hr tablet Take 50 mg by mouth daily. Take with or immediately following a meal.      . Multiple Vitamins-Minerals (EYE VITAMINS PO) Take by mouth daily.      . nitroGLYCERIN (NITROSTAT) 0.4 MG SL tablet Place 0.4 mg under the tongue every 5 (five) minutes as needed. For chest pain      . potassium chloride SA (K-DUR,KLOR-CON) 20 MEQ tablet Take 10 mEq by mouth daily.      Marland Kitchen warfarin (COUMADIN) 5 MG tablet Take as directed by coumadin clinic  40 tablet  3  . [DISCONTINUED] enoxaparin (LOVENOX) 100 MG/ML SOLN Inject into the skin every 12 (twelve) hours.        . [DISCONTINUED] simvastatin (ZOCOR) 80 MG tablet Take 80 mg by mouth at bedtime.         No current facility-administered medications for this visit.     Past Medical History  Diagnosis Date  . Coronary artery disease     a. Severe two vessel; s/p CABG;  b. 05/2012 NSTEMI in setting of rapid aflutter;  c. 06/2012 Lexiscan cardiolite: small, mild reversible defect in lateral wall->mild ischemia, low-risk->med Rx.  Marland Kitchen Hypertension   . Type II diabetes mellitus   .  Hyperlipidemia   . Diabetic neuropathy   . CVA (cerebral vascular accident) 2004  . Hypothyroidism   . Chronic anemia   . GERD (gastroesophageal reflux disease)   . Arthritis   . Memory loss, short term   . COPD (chronic obstructive pulmonary disease)   . Blood transfusion without reported diagnosis   . Chronic combined systolic and diastolic CHF (congestive heart failure)     a. 07/2012 Echo: EF 30%, Gr II DD, Mild AS/MR, Mod-Sev TR, mild bi-atrial and RV dil, PASP .  . Moderate mitral regurgitation     a. mod by TEE 2012, mild by echo 2013, 07/2012  . Severe tricuspid regurgitation     a. Severe by TEE 2012, mod by echo 2013, mod-sev by echo 07/2012  . Chronic vulvitis 07/1993  . Idiopathic anemia 2006  . Atrial fibrillation     a. Failed amiodarone (prolonged QT), not a candidate for class IC meds due to  her CAD. No Multaq due to CHF hx;  b. 07/2012 s/p SJM Anthem RF Bi-V PPM, ser # [id number] and AV node RFCA;  b. chronic coumadin.  . Atrial flutter     ROS:   All systems reviewed and negative except as noted in the HPI.   Past Surgical History  Procedure Laterality Date  . Coronary artery bypass graft  06/2009    X3, LIMA to LAD, SVG to diagonal, SVG to the posterior descending artery.  . Tonsillectomy    . Knee arthroscopy  08/2001  . Transthoracic echocardiogram  11/12/2010     Ejection fraction felt to be around 50%.  Wall thickness was increased in a pattern of mild LVH.  Moderately dilated left atrium.  Mildly dilated right atrium. Right ventricle was mildly dilated with mildly reduced systolic function  . Cardiac catheterization  06/09/2009    inferior wall hypokinesia with ejection fraction of 55%.  . Abdominal hysterectomy    . Joint replacement    . Tee without cardioversion  05/23/2011    Procedure: TRANSESOPHAGEAL ECHOCARDIOGRAM (TEE);  Surgeon: Lewayne Bunting, MD;  Location: [facility] ENDOSCOPY;  Service: Cardiovascular;  Laterality: N/A;  . Incision and drainage abscess  05/07/2012    Procedure: INCISION AND DRAINAGE ABSCESS;  Surgeon: Ardeth Sportsman, MD;  Location: [facility] OR;  Service: General;  Laterality: N/A;  Groin wound  . Total abdominal hysterectomy  1979  . Breast biopsy  Left  . Bilateral salpingectomy  10/2000  . Breast biopsy  09/18/2009    fibrocystic change  . Coronary artery bypass graft  06/2009  . Pacemaker insertion  08/2012     Family History  Problem Relation Age of Onset  . Colon cancer Mother   . Stroke Mother   . Hypertension Mother   . Cancer Mother     breast  . Cancer Father     colon  . Heart attack Father     x2  . Renal Disease Sister     dialysis-twin sister  . Renal Disease Sister     renal bypass     History   Social History  . Marital Status: Married    Spouse Name: N/A    Number of Children: 2  . Years of Education: N/A    Occupational History  . teacher    Social History Main Topics  . Smoking status: Former Smoker    Types: Cigarettes  . Smokeless tobacco: Never Used  . Alcohol Use: No  . Drug Use: No  .  Sexually Active: No     Comment: hysterectomy   Other Topics Concern  . Not on file   Social History Narrative   Lives in [place] with spouse     BP 102/72  Ht 5\' 3"  (1.6 m)  Wt 209 lb (94.802 kg)  BMI 37.03 kg/m2  Physical Exam:  Well appearing 79 year old woman,NAD HEENT: Unremarkable Neck:  8 cm JVD, no thyromegally Lungs:  Clear with rare scattered rales. HEART:  Regular tachycardia rhythm, no murmurs, no rubs, no clicks Abd:  soft, positive bowel sounds, no organomegally, no rebound, no guarding Ext:  2 plus pulses, no edema, no cyanosis, no clubbing Skin:  No rashes no nodules Neuro:  CN II through XII intact, motor grossly intact   DEVICE  Normal device function.  See PaceArt for details.   Assess/Plan:

## 2012-10-30 NOTE — Assessment & Plan Note (Signed)
Her [employer]. Jude biventricular pacemaker is working normally. Her left ventricular pacing threshold is a bit elevated. We'll plan to recheck in several months.

## 2012-10-30 NOTE — Assessment & Plan Note (Signed)
Her heart failure symptoms are class II. She will continue her current medical therapy, and maintain a low-sodium diet.

## 2012-10-30 NOTE — Assessment & Plan Note (Signed)
Interrogation of her pacemaker today demonstrates atrial tachycardia at 125 beats per minute. She has undergone anti-tachycardic pacing and we have converted her back to sinus rhythm at 70 beats per minute. This was carried out in the patient's room with no complications.

## 2012-11-13 ENCOUNTER — Ambulatory Visit (INDEPENDENT_AMBULATORY_CARE_PROVIDER_SITE_OTHER): Payer: Medicare Other | Admitting: *Deleted

## 2012-11-13 DIAGNOSIS — Z7901 Long term (current) use of anticoagulants: Secondary | ICD-10-CM

## 2012-11-13 DIAGNOSIS — I4891 Unspecified atrial fibrillation: Secondary | ICD-10-CM

## 2012-11-27 ENCOUNTER — Ambulatory Visit (HOSPITAL_BASED_OUTPATIENT_CLINIC_OR_DEPARTMENT_OTHER): Payer: Medicare Other

## 2012-11-27 ENCOUNTER — Other Ambulatory Visit (HOSPITAL_BASED_OUTPATIENT_CLINIC_OR_DEPARTMENT_OTHER): Payer: Medicare Other

## 2012-11-27 VITALS — BP 140/60 | HR 82 | Temp 97.5°F

## 2012-11-27 DIAGNOSIS — N189 Chronic kidney disease, unspecified: Secondary | ICD-10-CM

## 2012-11-27 DIAGNOSIS — N039 Chronic nephritic syndrome with unspecified morphologic changes: Secondary | ICD-10-CM

## 2012-11-27 DIAGNOSIS — D631 Anemia in chronic kidney disease: Secondary | ICD-10-CM

## 2012-11-27 LAB — CBC WITH DIFFERENTIAL/PLATELET
BASO%: 0.6 % (ref 0.0–2.0)
EOS%: 2.3 % (ref 0.0–7.0)
MCH: 28.7 pg (ref 25.1–34.0)
MCHC: 32.1 g/dL (ref 31.5–36.0)
MONO#: 1 10*3/uL — ABNORMAL HIGH (ref 0.1–0.9)
RBC: 3.59 10*6/uL — ABNORMAL LOW (ref 3.70–5.45)
RDW: 15.4 % — ABNORMAL HIGH (ref 11.2–14.5)
WBC: 8.4 10*3/uL (ref 3.9–10.3)
lymph#: 2.4 10*3/uL (ref 0.9–3.3)
nRBC: 0 % (ref 0–0)

## 2012-11-27 MED ORDER — DARBEPOETIN ALFA-POLYSORBATE 200 MCG/0.4ML IJ SOLN
200.0000 ug | Freq: Once | INTRAMUSCULAR | Status: AC
  Administered 2012-11-27: 200 ug via SUBCUTANEOUS
  Filled 2012-11-27: qty 0.4

## 2012-11-27 MED ORDER — FERUMOXYTOL INJECTION 510 MG/17 ML
510.0000 mg | Freq: Once | INTRAVENOUS | Status: DC
  Filled 2012-11-27: qty 17

## 2012-11-27 NOTE — Progress Notes (Signed)
Feraheme released in error.  Not given

## 2012-12-04 ENCOUNTER — Ambulatory Visit (INDEPENDENT_AMBULATORY_CARE_PROVIDER_SITE_OTHER): Payer: Medicare Other | Admitting: *Deleted

## 2012-12-04 DIAGNOSIS — Z7901 Long term (current) use of anticoagulants: Secondary | ICD-10-CM

## 2012-12-04 DIAGNOSIS — I4891 Unspecified atrial fibrillation: Secondary | ICD-10-CM

## 2012-12-04 LAB — POCT INR: INR: 1.9

## 2012-12-19 ENCOUNTER — Encounter: Payer: Self-pay | Admitting: Internal Medicine

## 2012-12-21 ENCOUNTER — Other Ambulatory Visit: Payer: Self-pay

## 2012-12-21 DIAGNOSIS — D631 Anemia in chronic kidney disease: Secondary | ICD-10-CM

## 2012-12-24 ENCOUNTER — Other Ambulatory Visit (HOSPITAL_BASED_OUTPATIENT_CLINIC_OR_DEPARTMENT_OTHER): Payer: Medicare Other | Admitting: Lab

## 2012-12-24 ENCOUNTER — Ambulatory Visit (HOSPITAL_BASED_OUTPATIENT_CLINIC_OR_DEPARTMENT_OTHER): Payer: Medicare Other

## 2012-12-24 VITALS — BP 132/61 | HR 91 | Temp 97.6°F

## 2012-12-24 DIAGNOSIS — N189 Chronic kidney disease, unspecified: Secondary | ICD-10-CM

## 2012-12-24 DIAGNOSIS — D631 Anemia in chronic kidney disease: Secondary | ICD-10-CM

## 2012-12-24 DIAGNOSIS — N039 Chronic nephritic syndrome with unspecified morphologic changes: Secondary | ICD-10-CM

## 2012-12-24 LAB — CBC WITH DIFFERENTIAL/PLATELET
BASO%: 0.4 % (ref 0.0–2.0)
LYMPH%: 26.9 % (ref 14.0–49.7)
MCHC: 32.6 g/dL (ref 31.5–36.0)
MONO#: 0.9 10*3/uL (ref 0.1–0.9)
Platelets: 246 10*3/uL (ref 145–400)
RBC: 3.23 10*6/uL — ABNORMAL LOW (ref 3.70–5.45)
RDW: 15.2 % — ABNORMAL HIGH (ref 11.2–14.5)
WBC: 6.9 10*3/uL (ref 3.9–10.3)

## 2012-12-24 MED ORDER — DARBEPOETIN ALFA-POLYSORBATE 200 MCG/0.4ML IJ SOLN
200.0000 ug | Freq: Once | INTRAMUSCULAR | Status: AC
  Administered 2012-12-24: 200 ug via SUBCUTANEOUS
  Filled 2012-12-24: qty 0.4

## 2012-12-25 ENCOUNTER — Ambulatory Visit (INDEPENDENT_AMBULATORY_CARE_PROVIDER_SITE_OTHER): Payer: Medicare Other | Admitting: *Deleted

## 2012-12-25 DIAGNOSIS — Z7901 Long term (current) use of anticoagulants: Secondary | ICD-10-CM

## 2012-12-25 DIAGNOSIS — I4891 Unspecified atrial fibrillation: Secondary | ICD-10-CM

## 2013-01-08 ENCOUNTER — Ambulatory Visit (INDEPENDENT_AMBULATORY_CARE_PROVIDER_SITE_OTHER): Payer: Medicare Other | Admitting: *Deleted

## 2013-01-08 DIAGNOSIS — I4891 Unspecified atrial fibrillation: Secondary | ICD-10-CM

## 2013-01-08 DIAGNOSIS — Z7901 Long term (current) use of anticoagulants: Secondary | ICD-10-CM

## 2013-01-14 ENCOUNTER — Other Ambulatory Visit: Payer: Self-pay | Admitting: *Deleted

## 2013-01-14 MED ORDER — WARFARIN SODIUM 5 MG PO TABS: ORAL_TABLET | ORAL | Status: DC

## 2013-01-16 ENCOUNTER — Encounter (HOSPITAL_COMMUNITY): Payer: Self-pay | Admitting: Emergency Medicine

## 2013-01-16 ENCOUNTER — Telehealth: Payer: Self-pay | Admitting: Cardiology

## 2013-01-16 ENCOUNTER — Observation Stay (HOSPITAL_COMMUNITY)
Admission: EM | Admit: 2013-01-16 | Discharge: 2013-01-20 | Disposition: A | Discharge status: Home / Self Care | Payer: Medicare Other | Attending: Internal Medicine | Admitting: Internal Medicine

## 2013-01-16 ENCOUNTER — Emergency Department (HOSPITAL_COMMUNITY): Payer: Medicare Other

## 2013-01-16 DIAGNOSIS — K219 Gastro-esophageal reflux disease without esophagitis: Secondary | ICD-10-CM | POA: Insufficient documentation

## 2013-01-16 DIAGNOSIS — N183 Chronic kidney disease, stage 3 unspecified: Secondary | ICD-10-CM

## 2013-01-16 DIAGNOSIS — E119 Type 2 diabetes mellitus without complications: Secondary | ICD-10-CM

## 2013-01-16 DIAGNOSIS — R0989 Other specified symptoms and signs involving the circulatory and respiratory systems: Secondary | ICD-10-CM | POA: Insufficient documentation

## 2013-01-16 DIAGNOSIS — D131 Benign neoplasm of stomach: Secondary | ICD-10-CM

## 2013-01-16 DIAGNOSIS — I509 Heart failure, unspecified: Secondary | ICD-10-CM | POA: Insufficient documentation

## 2013-01-16 DIAGNOSIS — E1149 Type 2 diabetes mellitus with other diabetic neurological complication: Secondary | ICD-10-CM | POA: Insufficient documentation

## 2013-01-16 DIAGNOSIS — I5043 Acute on chronic combined systolic (congestive) and diastolic (congestive) heart failure: Secondary | ICD-10-CM

## 2013-01-16 DIAGNOSIS — Z7901 Long term (current) use of anticoagulants: Secondary | ICD-10-CM

## 2013-01-16 DIAGNOSIS — K5730 Diverticulosis of large intestine without perforation or abscess without bleeding: Secondary | ICD-10-CM

## 2013-01-16 DIAGNOSIS — Z951 Presence of aortocoronary bypass graft: Secondary | ICD-10-CM

## 2013-01-16 DIAGNOSIS — J90 Pleural effusion, not elsewhere classified: Secondary | ICD-10-CM

## 2013-01-16 DIAGNOSIS — D1330 Benign neoplasm of unspecified part of small intestine: Secondary | ICD-10-CM | POA: Insufficient documentation

## 2013-01-16 DIAGNOSIS — I429 Cardiomyopathy, unspecified: Secondary | ICD-10-CM

## 2013-01-16 DIAGNOSIS — Z8673 Personal history of transient ischemic attack (TIA), and cerebral infarction without residual deficits: Secondary | ICD-10-CM | POA: Insufficient documentation

## 2013-01-16 DIAGNOSIS — K922 Gastrointestinal hemorrhage, unspecified: Secondary | ICD-10-CM

## 2013-01-16 DIAGNOSIS — J4489 Other specified chronic obstructive pulmonary disease: Secondary | ICD-10-CM | POA: Insufficient documentation

## 2013-01-16 DIAGNOSIS — I2510 Atherosclerotic heart disease of native coronary artery without angina pectoris: Secondary | ICD-10-CM

## 2013-01-16 DIAGNOSIS — D132 Benign neoplasm of duodenum: Secondary | ICD-10-CM

## 2013-01-16 DIAGNOSIS — R5381 Other malaise: Secondary | ICD-10-CM

## 2013-01-16 DIAGNOSIS — J189 Pneumonia, unspecified organism: Secondary | ICD-10-CM

## 2013-01-16 DIAGNOSIS — Z794 Long term (current) use of insulin: Secondary | ICD-10-CM | POA: Insufficient documentation

## 2013-01-16 DIAGNOSIS — L02415 Cutaneous abscess of right lower limb: Secondary | ICD-10-CM

## 2013-01-16 DIAGNOSIS — R7881 Bacteremia: Secondary | ICD-10-CM

## 2013-01-16 DIAGNOSIS — R509 Fever, unspecified: Secondary | ICD-10-CM

## 2013-01-16 DIAGNOSIS — K921 Melena: Secondary | ICD-10-CM | POA: Insufficient documentation

## 2013-01-16 DIAGNOSIS — J811 Chronic pulmonary edema: Secondary | ICD-10-CM

## 2013-01-16 DIAGNOSIS — I5189 Other ill-defined heart diseases: Secondary | ICD-10-CM

## 2013-01-16 DIAGNOSIS — I471 Supraventricular tachycardia: Secondary | ICD-10-CM

## 2013-01-16 DIAGNOSIS — D472 Monoclonal gammopathy: Secondary | ICD-10-CM

## 2013-01-16 DIAGNOSIS — R0609 Other forms of dyspnea: Secondary | ICD-10-CM | POA: Diagnosis present

## 2013-01-16 DIAGNOSIS — I4892 Unspecified atrial flutter: Secondary | ICD-10-CM

## 2013-01-16 DIAGNOSIS — Z950 Presence of cardiac pacemaker: Secondary | ICD-10-CM

## 2013-01-16 DIAGNOSIS — J449 Chronic obstructive pulmonary disease, unspecified: Secondary | ICD-10-CM | POA: Insufficient documentation

## 2013-01-16 DIAGNOSIS — D649 Anemia, unspecified: Principal | ICD-10-CM

## 2013-01-16 DIAGNOSIS — D631 Anemia in chronic kidney disease: Secondary | ICD-10-CM

## 2013-01-16 DIAGNOSIS — Z800 Family history of malignant neoplasm of digestive organs: Secondary | ICD-10-CM | POA: Insufficient documentation

## 2013-01-16 DIAGNOSIS — I4719 Other supraventricular tachycardia: Secondary | ICD-10-CM

## 2013-01-16 DIAGNOSIS — I4891 Unspecified atrial fibrillation: Secondary | ICD-10-CM

## 2013-01-16 DIAGNOSIS — Z79899 Other long term (current) drug therapy: Secondary | ICD-10-CM | POA: Insufficient documentation

## 2013-01-16 DIAGNOSIS — I5042 Chronic combined systolic (congestive) and diastolic (congestive) heart failure: Secondary | ICD-10-CM

## 2013-01-16 DIAGNOSIS — I50810 Right heart failure, unspecified: Secondary | ICD-10-CM

## 2013-01-16 DIAGNOSIS — E785 Hyperlipidemia, unspecified: Secondary | ICD-10-CM | POA: Insufficient documentation

## 2013-01-16 DIAGNOSIS — R531 Weakness: Secondary | ICD-10-CM

## 2013-01-16 DIAGNOSIS — I5022 Chronic systolic (congestive) heart failure: Secondary | ICD-10-CM

## 2013-01-16 DIAGNOSIS — E1142 Type 2 diabetes mellitus with diabetic polyneuropathy: Secondary | ICD-10-CM | POA: Insufficient documentation

## 2013-01-16 DIAGNOSIS — E118 Type 2 diabetes mellitus with unspecified complications: Secondary | ICD-10-CM

## 2013-01-16 DIAGNOSIS — N189 Chronic kidney disease, unspecified: Secondary | ICD-10-CM

## 2013-01-16 DIAGNOSIS — J9600 Acute respiratory failure, unspecified whether with hypoxia or hypercapnia: Secondary | ICD-10-CM

## 2013-01-16 DIAGNOSIS — I129 Hypertensive chronic kidney disease with stage 1 through stage 4 chronic kidney disease, or unspecified chronic kidney disease: Secondary | ICD-10-CM | POA: Insufficient documentation

## 2013-01-16 DIAGNOSIS — E039 Hypothyroidism, unspecified: Secondary | ICD-10-CM

## 2013-01-16 DIAGNOSIS — R0789 Other chest pain: Secondary | ICD-10-CM | POA: Insufficient documentation

## 2013-01-16 HISTORY — DX: Gastrointestinal hemorrhage, unspecified: K92.2

## 2013-01-16 HISTORY — DX: Presence of cardiac pacemaker: Z95.0

## 2013-01-16 LAB — BASIC METABOLIC PANEL
BUN: 31 mg/dL — ABNORMAL HIGH (ref 6–23)
CO2: 27 mEq/L (ref 19–32)
Calcium: 8.6 mg/dL (ref 8.4–10.5)
Chloride: 98 mEq/L (ref 96–112)
Creatinine, Ser: 1.57 mg/dL — ABNORMAL HIGH (ref 0.50–1.10)
GFR calc Af Amer: 35 mL/min — ABNORMAL LOW (ref >90)
GFR calc non Af Amer: 30 mL/min — ABNORMAL LOW (ref >90)
Glucose, Bld: 293 mg/dL — ABNORMAL HIGH (ref 70–99)
Potassium: 3.8 mEq/L (ref 3.5–5.1)
Sodium: 135 mEq/L (ref 135–145)

## 2013-01-16 LAB — RETICULOCYTES
RBC.: 2.73 MIL/uL — ABNORMAL LOW (ref 3.87–5.11)
Retic Count, Absolute: 106.5 10*3/uL (ref 19.0–186.0)
Retic Ct Pct: 3.9 % — ABNORMAL HIGH (ref 0.4–3.1)

## 2013-01-16 LAB — CBC WITH DIFFERENTIAL/PLATELET
Basophils Absolute: 0 10*3/uL (ref 0.0–0.1)
Basophils Relative: 0 % (ref 0–1)
Eosinophils Absolute: 0.1 10*3/uL (ref 0.0–0.7)
Eosinophils Relative: 2 % (ref 0–5)
HCT: 25 % — ABNORMAL LOW (ref 36.0–46.0)
Hemoglobin: 8 g/dL — ABNORMAL LOW (ref 12.0–15.0)
Lymphocytes Relative: 22 % (ref 12–46)
Lymphs Abs: 1.6 10*3/uL (ref 0.7–4.0)
MCH: 29.3 pg (ref 26.0–34.0)
MCHC: 32 g/dL (ref 30.0–36.0)
MCV: 91.6 fL (ref 78.0–100.0)
Monocytes Absolute: 1 10*3/uL (ref 0.1–1.0)
Monocytes Relative: 14 % — ABNORMAL HIGH (ref 3–12)
Neutro Abs: 4.3 10*3/uL (ref 1.7–7.7)
Neutrophils Relative %: 62 % (ref 43–77)
Platelets: 225 10*3/uL (ref 150–400)
RBC: 2.73 MIL/uL — ABNORMAL LOW (ref 3.87–5.11)
RDW: 16.3 % — ABNORMAL HIGH (ref 11.5–15.5)
WBC: 7 10*3/uL (ref 4.0–10.5)

## 2013-01-16 LAB — TROPONIN I: Troponin I: <0.3 ng/mL (ref <0.30)

## 2013-01-16 LAB — PROTIME-INR
INR: 1.59 — ABNORMAL HIGH (ref 0.00–1.49)
Prothrombin Time: 18.5 seconds — ABNORMAL HIGH (ref 11.6–15.2)

## 2013-01-16 LAB — PRO B NATRIURETIC PEPTIDE: Pro B Natriuretic peptide (BNP): 1432 pg/mL — ABNORMAL HIGH (ref 0–450)

## 2013-01-16 MED ORDER — INSULIN GLARGINE 100 UNIT/ML ~~LOC~~ SOLN
35.0000 [IU] | Freq: Two times a day (BID) | SUBCUTANEOUS | Status: DC
  Administered 2013-01-17 – 2013-01-20 (×8): 35 [IU] via SUBCUTANEOUS
  Filled 2013-01-16 (×9): qty 0.35

## 2013-01-16 MED ORDER — FUROSEMIDE 20 MG PO TABS
60.0000 mg | ORAL_TABLET | Freq: Every morning | ORAL | Status: DC
  Administered 2013-01-17 – 2013-01-20 (×4): 60 mg via ORAL
  Filled 2013-01-16 (×5): qty 1

## 2013-01-16 MED ORDER — PANTOPRAZOLE SODIUM 40 MG PO TBEC
40.0000 mg | DELAYED_RELEASE_TABLET | Freq: Every day | ORAL | Status: DC
  Administered 2013-01-17 – 2013-01-20 (×4): 40 mg via ORAL
  Filled 2013-01-16 (×4): qty 1

## 2013-01-16 MED ORDER — INSULIN ASPART 100 UNIT/ML ~~LOC~~ SOLN
0.0000 [IU] | Freq: Every day | SUBCUTANEOUS | Status: DC
  Administered 2013-01-17 – 2013-01-19 (×3): 3 [IU] via SUBCUTANEOUS

## 2013-01-16 MED ORDER — DILTIAZEM HCL ER COATED BEADS 240 MG PO CP24
240.0000 mg | ORAL_CAPSULE | Freq: Every day | ORAL | Status: DC
  Administered 2013-01-17 – 2013-01-20 (×4): 240 mg via ORAL
  Filled 2013-01-16 (×4): qty 1

## 2013-01-16 MED ORDER — POTASSIUM CHLORIDE CRYS ER 10 MEQ PO TBCR
10.0000 meq | EXTENDED_RELEASE_TABLET | Freq: Every morning | ORAL | Status: DC
  Administered 2013-01-17 – 2013-01-20 (×4): 10 meq via ORAL
  Filled 2013-01-16 (×5): qty 1

## 2013-01-16 MED ORDER — LEVOTHYROXINE SODIUM 100 MCG PO TABS
100.0000 ug | ORAL_TABLET | Freq: Every day | ORAL | Status: DC
  Administered 2013-01-17 – 2013-01-20 (×4): 100 ug via ORAL
  Filled 2013-01-16 (×5): qty 1

## 2013-01-16 MED ORDER — NITROGLYCERIN 0.4 MG SL SUBL: 0.4000 mg | SUBLINGUAL_TABLET | SUBLINGUAL | Status: DC | PRN

## 2013-01-16 MED ORDER — METOPROLOL SUCCINATE ER 50 MG PO TB24
50.0000 mg | ORAL_TABLET | Freq: Every morning | ORAL | Status: DC
Start: 2013-01-17 — End: 2013-01-20
  Administered 2013-01-17 – 2013-01-20 (×4): 50 mg via ORAL
  Filled 2013-01-16 (×4): qty 1

## 2013-01-16 MED ORDER — INSULIN ASPART 100 UNIT/ML ~~LOC~~ SOLN
0.0000 [IU] | Freq: Three times a day (TID) | SUBCUTANEOUS | Status: DC
  Administered 2013-01-17: 5 [IU] via SUBCUTANEOUS
  Administered 2013-01-17: 1 [IU] via SUBCUTANEOUS
  Administered 2013-01-18: 3 [IU] via SUBCUTANEOUS
  Administered 2013-01-18: 2 [IU] via SUBCUTANEOUS
  Administered 2013-01-19: 5 [IU] via SUBCUTANEOUS
  Administered 2013-01-20: 2 [IU] via SUBCUTANEOUS

## 2013-01-16 MED ORDER — SODIUM CHLORIDE 0.9 % IJ SOLN
3.0000 mL | Freq: Two times a day (BID) | INTRAMUSCULAR | Status: DC
  Administered 2013-01-17 – 2013-01-20 (×8): 3 mL via INTRAVENOUS

## 2013-01-16 MED ORDER — DONEPEZIL HCL 5 MG PO TABS
5.0000 mg | ORAL_TABLET | Freq: Every day | ORAL | Status: DC
  Administered 2013-01-17 – 2013-01-19 (×4): 5 mg via ORAL
  Filled 2013-01-16 (×5): qty 1

## 2013-01-16 MED ORDER — ASPIRIN EC 81 MG PO TBEC
81.0000 mg | DELAYED_RELEASE_TABLET | Freq: Every morning | ORAL | Status: DC
  Administered 2013-01-17 – 2013-01-20 (×4): 81 mg via ORAL
  Filled 2013-01-16 (×4): qty 1

## 2013-01-16 MED ORDER — ATORVASTATIN CALCIUM 40 MG PO TABS
40.0000 mg | ORAL_TABLET | Freq: Every evening | ORAL | Status: DC
  Administered 2013-01-17 – 2013-01-19 (×4): 40 mg via ORAL
  Filled 2013-01-16 (×5): qty 1

## 2013-01-16 MED ORDER — EZETIMIBE 10 MG PO TABS
10.0000 mg | ORAL_TABLET | Freq: Every evening | ORAL | Status: DC
  Administered 2013-01-17 – 2013-01-19 (×4): 10 mg via ORAL
  Filled 2013-01-16 (×5): qty 1

## 2013-01-16 MED ORDER — AMITRIPTYLINE HCL 75 MG PO TABS
75.0000 mg | ORAL_TABLET | Freq: Every day | ORAL | Status: DC
  Administered 2013-01-17 – 2013-01-19 (×4): 75 mg via ORAL
  Filled 2013-01-16 (×5): qty 1

## 2013-01-16 NOTE — ED Notes (Signed)
Patient transported to X-ray 

## 2013-01-16 NOTE — Telephone Encounter (Signed)
Returned call to patient's husband.He stated wife started having chest pain and rapid heart beat last night.Stated she is having chest pain now and rapid heart beat.Advised she needs to go to [facility] ER.Husband upset wants her seen in office.Husband was told it is our office policy we do not see active chest pain in office,better for patient to go to ER.

## 2013-01-16 NOTE — H&P (Signed)
[facility] History and Physical  [name] NFA:[id number] DOB: 02/12/1933 DOA: 01/16/2013  Referring physician: Dr Juleen China. PCP: [name], MD  Specialists: Dr Thomasene Lot  Chief Complaint: shortness of breath and chest tightness last 2 weeks.   HPI: [name] is a 79 y.o. female with prior h/o hypertension, DM, chronic systolic and diastolic heart failure, ischemic cardiomyopathy, COPD, CVA, GERD, atrial fibrillation on coumadin, follows with DR Thomasene Lot, came in for worsening DOE, and chest tightness for the last few weeks. No syncope, or palpitations at rest. Reports palpitations with DOE. No fever or cough. Pedal edema the same from before. No change in medications recently, no change in diet. No headache or nausea or vomiting. On arrival to ed, her probnp is better than the previous one. Her CXR does not show pulmonary edema or congestion. She doesn't appear to be in heart failure. Her labs revealed hemoglobin of 8. Drop from 10. She reports occasional blood in the stools. She has a h/o hemorrhoids. She is being admitted to medical service for worsening sob.  Review of Systems: The patient denies anorexia, fever, weight loss,, vision loss, decreased hearing, hoarseness,syncope,  balance deficits, hemoptysis, abdominal pain, melena, hematochezia, severe indigestion/heartburn, hematuria, incontinence, genital sores, muscle weakness, suspicious skin lesions, transient blindness, difficulty walking, depression, unusual weight change, abnormal bleeding, enlarged lymph nodes, angioedema, and breast masses.    Past Medical History  Diagnosis Date  . Coronary artery disease     a. Severe two vessel; s/p CABG;  b. 05/2012 NSTEMI in setting of rapid aflutter;  c. 06/2012 Lexiscan cardiolite: small, mild reversible defect in lateral wall->mild ischemia, low-risk->med Rx.  Marland Kitchen Hypertension   . Type II diabetes mellitus   . Hyperlipidemia   . Diabetic neuropathy   . CVA (cerebral  vascular accident) 2004  . Hypothyroidism   . Chronic anemia   . GERD (gastroesophageal reflux disease)   . Arthritis   . Memory loss, short term   . COPD (chronic obstructive pulmonary disease)   . Blood transfusion without reported diagnosis   . Chronic combined systolic and diastolic CHF (congestive heart failure)     a. 07/2012 Echo: EF 30%, Gr II DD, Mild AS/MR, Mod-Sev TR, mild bi-atrial and RV dil, PASP .  . Moderate mitral regurgitation     a. mod by TEE 2012, mild by echo 2013, 07/2012  . Severe tricuspid regurgitation     a. Severe by TEE 2012, mod by echo 2013, mod-sev by echo 07/2012  . Chronic vulvitis 07/1993  . Idiopathic anemia 2006  . Atrial fibrillation     a. Failed amiodarone (prolonged QT), not a candidate for class IC meds due to her CAD. No Multaq due to CHF hx;  b. 07/2012 s/p SJM Anthem RF Bi-V PPM, ser # [id number] and AV node RFCA;  b. chronic coumadin.  . Atrial flutter   . Pacemaker    Past Surgical History  Procedure Laterality Date  . Coronary artery bypass graft  06/2009    X3, LIMA to LAD, SVG to diagonal, SVG to the posterior descending artery.  . Tonsillectomy    . Knee arthroscopy  08/2001  . Transthoracic echocardiogram  11/12/2010     Ejection fraction felt to be around 50%.  Wall thickness was increased in a pattern of mild LVH.  Moderately dilated left atrium.  Mildly dilated right atrium. Right ventricle was mildly dilated with mildly reduced systolic function  . Cardiac catheterization  06/09/2009    inferior wall  hypokinesia with ejection fraction of 55%.  . Abdominal hysterectomy    . Joint replacement    . Tee without cardioversion  05/23/2011    Procedure: TRANSESOPHAGEAL ECHOCARDIOGRAM (TEE);  Surgeon: Lewayne Bunting, MD;  Location: [facility] ENDOSCOPY;  Service: Cardiovascular;  Laterality: N/A;  . Incision and drainage abscess  05/07/2012    Procedure: INCISION AND DRAINAGE ABSCESS;  Surgeon: Ardeth Sportsman, MD;  Location: [facility] OR;  Service:  General;  Laterality: N/A;  Groin wound  . Total abdominal hysterectomy  1979  . Breast biopsy  Left  . Bilateral salpingectomy  10/2000  . Breast biopsy  09/18/2009    fibrocystic change  . Coronary artery bypass graft  06/2009  . Pacemaker insertion  08/2012   Social History:  reports that she has quit smoking. Her smoking use included Cigarettes. She smoked 0.00 packs per day. She has never used smokeless tobacco. She reports that she does not drink alcohol or use illicit drugs.  where does patient live--home,  Allergies  Allergen Reactions  . Betadine (Povidone Iodine) Itching  . Capoten (Captopril) Other (See Comments)    unknown  . Codone (Hydrocodone) Nausea And Vomiting  . Methadone Nausea And Vomiting  . Penicillins Swelling  . Propoxyphene And Methadone     Intolerance to darvocet    Family History  Problem Relation Age of Onset  . Colon cancer Mother   . Stroke Mother   . Hypertension Mother   . Cancer Mother     breast  . Cancer Father     colon  . Heart attack Father     x2  . Renal Disease Sister     dialysis-twin sister  . Renal Disease Sister     renal bypass    Prior to Admission medications   Medication Sig Start Date End Date Taking? Authorizing Provider  amitriptyline (ELAVIL) 25 MG tablet Take 75 mg by mouth at bedtime.     Yes Historical Provider, MD  aspirin EC 81 MG tablet Take 81 mg by mouth every morning.    Yes Historical Provider, MD  atorvastatin (LIPITOR) 40 MG tablet Take 40 mg by mouth every evening.    Yes Historical Provider, MD  diltiazem (CARDIZEM CD) 240 MG 24 hr capsule Take 1 capsule (240 mg total) by mouth daily. 05/30/12  Yes Ok Anis, NP  donepezil (ARICEPT) 5 MG tablet Take 5 mg by mouth at bedtime.   Yes Historical Provider, MD  esomeprazole (NEXIUM) 40 MG capsule Take 40 mg by mouth daily before breakfast.     Yes Historical Provider, MD  ezetimibe (ZETIA) 10 MG tablet Take 10 mg by mouth every evening.    Yes  Historical Provider, MD  furosemide (LASIX) 40 MG tablet Take 60 mg by mouth every morning.   Yes Historical Provider, MD  insulin glargine (LANTUS) 100 UNIT/ML injection Inject 35 Units into the skin 2 (two) times daily.   Yes Historical Provider, MD  insulin lispro (HUMALOG) 100 UNIT/ML injection Inject 0-20 Units into the skin 2 (two) times daily as needed for high blood sugar. Home sliding scale If blood sugar is <200 take 15 units If blood sugar is >200 take 20 units If blood sugar is less than 70, don't take any   Yes Historical Provider, MD  levothyroxine (SYNTHROID, LEVOTHROID) 100 MCG tablet Take 100 mcg by mouth every morning.    Yes Historical Provider, MD  metoprolol succinate (TOPROL-XL) 50 MG 24 hr tablet Take 50 mg  by mouth every morning. Take with or immediately following a meal. 05/30/12  Yes Ok Anis, NP  Multiple Vitamins-Minerals (EYE VITAMINS PO) Take 1 tablet by mouth 2 (two) times daily.    Yes Historical Provider, MD  nitroGLYCERIN (NITROSTAT) 0.4 MG SL tablet Place 0.4 mg under the tongue every 5 (five) minutes as needed. For chest pain   Yes Historical Provider, MD  potassium chloride SA (K-DUR,KLOR-CON) 20 MEQ tablet Take 10 mEq by mouth every morning.    Yes Historical Provider, MD  warfarin (COUMADIN) 5 MG tablet Take 7.5-10 mg by mouth every evening. Wednesday and Saturday take 2 tablets. Sunday, Monday, Tuesday, Thursday and Friday take 1.5 tabs.   Yes Historical Provider, MD   Physical Exam: Filed Vitals:   01/16/13 1610 01/16/13 1951  BP: 143/66 137/57  Pulse: 90 91  Temp: 98.2 F (36.8 C)   TempSrc: Oral   Resp: 20 21  SpO2: 96% 100%    Constitutional: Vital signs reviewed.  Patient is a well-developed and well-nourished  in no acute distress and cooperative with exam. Alert and oriented x3.  Head: Normocephalic and atraumatic Mouth: no erythema or exudates, MMM Eyes: PERRL, EOMI, conjunctivae normal, No scleral icterus.  Neck: Supple,  Trachea midline normal ROM, No JVD, mass, thyromegaly, or carotid bruit present.  Cardiovascular: RRR, S1 normal, S2 normal, no MRG, pulses symmetric and intact bilaterally Pulmonary/Chest: normal respiratory effort, CTAB, no wheezes, rales, or rhonchi Abdominal: Soft. Non-tender, non-distended, bowel sounds are normal, no masses, organomegaly, or guarding present.  Musculoskeletal: No joint deformities, erythema, or stiffness, ROM full and no nontender Neurological: A&O x3, Strength is normal and symmetric bilaterally, cranial nerve II-XII are grossly intact, no focal motor deficit,   Skin: Warm, dry and intact. No rash, cyanosis, or clubbing.  Psychiatric: Normal mood and affect. speech and behavior is normal. Labs on Admission:  Basic Metabolic Panel:  Recent Labs Lab 01/16/13 1715  NA 135  K 3.8  CL 98  CO2 27  GLUCOSE 293*  BUN 31*  CREATININE 1.57*  CALCIUM 8.6   Liver Function Tests: No results found for this basename: AST, ALT, ALKPHOS, BILITOT, PROT, ALBUMIN,  in the last 168 hours No results found for this basename: LIPASE, AMYLASE,  in the last 168 hours No results found for this basename: AMMONIA,  in the last 168 hours CBC:  Recent Labs Lab 01/16/13 1715  WBC 7.0  NEUTROABS 4.3  HGB 8.0*  HCT 25.0*  MCV 91.6  PLT 225   Cardiac Enzymes:  Recent Labs Lab 01/16/13 1715  TROPONINI <0.30    BNP (last 3 results)  Recent Labs  07/20/12 0410 08/08/12 0855 01/16/13 1742  PROBNP 2508.0* 4623.0* 1432.0*   CBG: No results found for this basename: GLUCAP,  in the last 168 hours  Radiological Exams on Admission: Dg Chest 2 View  01/16/2013   *RADIOLOGY REPORT*  Clinical Data: Chest pain and weakness  CHEST - 2 VIEW  Comparison: 08/09/2012  Findings: There is a left chest wall pacer device with leads in the right atrial appendage, coronary sinus and right ventricle.  Previous median sternotomy and CABG procedure.  There is mild cardiac enlargement.  No  pleural effusion or edema identified.  No airspace consolidation.  IMPRESSION:  1.  No acute cardiopulmonary abnormalities.   Original Report Authenticated By: Signa Kell, M.D.    EKG: pending.  Assessment/Plan Active Problems:   1.  DOE/chest tightness:  - admit to step down - differential, possibly  from anemia vs ACS. Currently not in failure.  - serial troponins and repeat EKG.  - last echo in 2/14 showed EF of 45% with diastolic dysfunction.  - anemia work up, with stool for occult blood, anemia panel .  - her last colonoscopy was 5 years ago by Dr Lottie Mussel.   2. Chronic systolic and diastolic heart failrue - appears to be compensated.  - resume lasix at home dose.   3. COPD: CURRENTLY not wheezing.  - resume nebs as needed.   4. Diabetes mellitus: Hgba1c,  SSI Resume home insulin.   5. CKD stage 3 : at baseline.   6. Atrial fibrillation:  - rate controlled.  -on coumadin.  - INR sub therapeutic.   7. DVT prophylaxis.   Code Status: full code Family Communication: family at bedside Disposition Plan: pending PT evaluation.   Time spent: 60 min  Lunden Stieber [facility] Pager [phone]  If 7PM-7AM, please contact night-coverage [email or web address] Password [facility] 01/16/2013, 8:42 PM

## 2013-01-16 NOTE — ED Notes (Signed)
Pt complains of "chest heaviness x 1 month off and on" Pt reports that the heaviness increases with walking.

## 2013-01-16 NOTE — Telephone Encounter (Signed)
New Prob     C/o upper chest pain, pt feels full, heart rate is fast, & pt feels bad all over. Husband would like pt seen right away.

## 2013-01-16 NOTE — Telephone Encounter (Signed)
Husband called back into the office requested the pt be seen immediately.  Instructed pt should be evaluated at the ED

## 2013-01-16 NOTE — ED Provider Notes (Signed)
History     80yf with CP and SOB. Significant cardiac hx including CAD, Afib, CHF, s/p BiV pacemaker was implanted followed by AV nodal ablation in December 2013. Dyspnea and chest tightness with minimal activity worsening over the past several days. Just walking from one room in house to another will bring on symptoms. Tightness across anterior chest. No radiation. No nausea or diaphoresis. Has had palpitations with these episodes. Feels like heart is beating fast.  Improves with rest. Denies orthopnea. Chronic LE edema, w/o acute change. No fever or chills. No cough. Reports compliance with medications. No recent medications changes aside from addition of zetia about a month ago.      CSN: [id number] Arrival date & time 01/16/13  1604  First MD Initiated Contact with Patient 01/16/13 1607     Chief Complaint  Patient presents with  . Chest Pain   (Consider location/radiation/quality/duration/timing/severity/associated sxs/prior Treatment) HPI Past Medical History  Diagnosis Date  . Coronary artery disease     a. Severe two vessel; s/p CABG;  b. 05/2012 NSTEMI in setting of rapid aflutter;  c. 06/2012 Lexiscan cardiolite: small, mild reversible defect in lateral wall->mild ischemia, low-risk->med Rx.  Marland Kitchen Hypertension   . Type II diabetes mellitus   . Hyperlipidemia   . Diabetic neuropathy   . CVA (cerebral vascular accident) 2004  . Hypothyroidism   . Chronic anemia   . GERD (gastroesophageal reflux disease)   . Arthritis   . Memory loss, short term   . COPD (chronic obstructive pulmonary disease)   . Blood transfusion without reported diagnosis   . Chronic combined systolic and diastolic CHF (congestive heart failure)     a. 07/2012 Echo: EF 30%, Gr II DD, Mild AS/MR, Mod-Sev TR, mild bi-atrial and RV dil, PASP .  . Moderate mitral regurgitation     a. mod by TEE 2012, mild by echo 2013, 07/2012  . Severe tricuspid regurgitation     a. Severe by TEE 2012, mod by echo  2013, mod-sev by echo 07/2012  . Chronic vulvitis 07/1993  . Idiopathic anemia 2006  . Atrial fibrillation     a. Failed amiodarone (prolonged QT), not a candidate for class IC meds due to her CAD. No Multaq due to CHF hx;  b. 07/2012 s/p SJM Anthem RF Bi-V PPM, ser # [id number] and AV node RFCA;  b. chronic coumadin.  . Atrial flutter    Past Surgical History  Procedure Laterality Date  . Coronary artery bypass graft  06/2009    X3, LIMA to LAD, SVG to diagonal, SVG to the posterior descending artery.  . Tonsillectomy    . Knee arthroscopy  08/2001  . Transthoracic echocardiogram  11/12/2010     Ejection fraction felt to be around 50%.  Wall thickness was increased in a pattern of mild LVH.  Moderately dilated left atrium.  Mildly dilated right atrium. Right ventricle was mildly dilated with mildly reduced systolic function  . Cardiac catheterization  06/09/2009    inferior wall hypokinesia with ejection fraction of 55%.  . Abdominal hysterectomy    . Joint replacement    . Tee without cardioversion  05/23/2011    Procedure: TRANSESOPHAGEAL ECHOCARDIOGRAM (TEE);  Surgeon: Lewayne Bunting, MD;  Location: [facility] ENDOSCOPY;  Service: Cardiovascular;  Laterality: N/A;  . Incision and drainage abscess  05/07/2012    Procedure: INCISION AND DRAINAGE ABSCESS;  Surgeon: Ardeth Sportsman, MD;  Location: [facility] OR;  Service: General;  Laterality: N/A;  Groin wound  .  Total abdominal hysterectomy  1979  . Breast biopsy  Left  . Bilateral salpingectomy  10/2000  . Breast biopsy  09/18/2009    fibrocystic change  . Coronary artery bypass graft  06/2009  . Pacemaker insertion  08/2012   Family History  Problem Relation Age of Onset  . Colon cancer Mother   . Stroke Mother   . Hypertension Mother   . Cancer Mother     breast  . Cancer Father     colon  . Heart attack Father     x2  . Renal Disease Sister     dialysis-twin sister  . Renal Disease Sister     renal bypass   History  Substance Use Topics   . Smoking status: Former Smoker    Types: Cigarettes  . Smokeless tobacco: Never Used  . Alcohol Use: No   OB History   Grav Para Term Preterm Abortions TAB SAB Ect Mult Living   2 2 2       2      Review of Systems  All systems reviewed and negative, other than as noted in HPI.   Allergies  Betadine; Capoten; Codone; Methadone; Penicillins; and Propoxyphene and methadone  Home Medications   Current Outpatient Rx  Name  Route  Sig  Dispense  Refill  . ACETAMINOPHEN PO   Oral   Take 2 tablets by mouth every 4 (four) hours as needed. For pain         . amitriptyline (ELAVIL) 25 MG tablet   Oral   Take 75 mg by mouth at bedtime.           Marland Kitchen aspirin EC 81 MG tablet   Oral   Take 81 mg by mouth daily.         Marland Kitchen atorvastatin (LIPITOR) 40 MG tablet   Oral   Take 40 mg by mouth every evening.          . diltiazem (CARDIZEM CD) 240 MG 24 hr capsule   Oral   Take 1 capsule (240 mg total) by mouth daily.   30 capsule   6   . donepezil (ARICEPT) 5 MG tablet   Oral   Take 5 mg by mouth at bedtime.         Marland Kitchen esomeprazole (NEXIUM) 40 MG capsule   Oral   Take 40 mg by mouth daily before breakfast.           . ezetimibe (ZETIA) 10 MG tablet   Oral   Take 10 mg by mouth daily.         . furosemide (LASIX) 40 MG tablet   Oral   Take 1.5 tablets (60 mg total) by mouth daily.   45 tablet   6   . insulin glargine (LANTUS) 100 UNIT/ML injection   Subcutaneous   Inject 35 Units into the skin 2 (two) times daily.         . insulin lispro (HUMALOG) 100 UNIT/ML injection   Subcutaneous   Inject 0-20 Units into the skin 3 (three) times daily as needed. Home sliding scale If blood sugar is <200 take 15 units If blood sugar is >200 take 20 units If blood sugar is less than 70, don't take any         . levothyroxine (SYNTHROID, LEVOTHROID) 100 MCG tablet   Oral   Take 100 mcg by mouth daily.         . metoprolol succinate (TOPROL-XL) 50 MG  24 hr  tablet   Oral   Take 50 mg by mouth daily. Take with or immediately following a meal.         . Multiple Vitamins-Minerals (EYE VITAMINS PO)   Oral   Take by mouth daily.         . nitroGLYCERIN (NITROSTAT) 0.4 MG SL tablet   Sublingual   Place 0.4 mg under the tongue every 5 (five) minutes as needed. For chest pain         . potassium chloride SA (K-DUR,KLOR-CON) 20 MEQ tablet   Oral   Take 10 mEq by mouth daily.         Marland Kitchen warfarin (COUMADIN) 5 MG tablet      Take as directed by coumadin clinic   50 tablet   3     50 tabs is 30 day supply    There were no vitals taken for this visit. Physical Exam  Nursing note and vitals reviewed. Constitutional: She appears well-developed and well-nourished. No distress.  HENT:  Head: Normocephalic and atraumatic.  Eyes: Conjunctivae are normal. Right eye exhibits no discharge. Left eye exhibits no discharge.  Neck: Neck supple.  Cardiovascular: Normal rate and regular rhythm.  Exam reveals no gallop and no friction rub.   Murmur heard. Systolic murmur  Pulmonary/Chest: Effort normal and breath sounds normal. No respiratory distress.  Abdominal: Soft. She exhibits no distension. There is no tenderness.  Musculoskeletal: She exhibits edema. She exhibits no tenderness.  Neurological: She is alert.  Skin: Skin is warm and dry.  Psychiatric: She has a normal mood and affect. Her behavior is normal. Thought content normal.    ED Course  Procedures (including critical care time) Labs Reviewed  PROTIME-INR - Abnormal; Notable for the following:    Prothrombin Time 18.5 (*)    INR 1.59 (*)    All other components within normal limits  CBC WITH DIFFERENTIAL - Abnormal; Notable for the following:    RBC 2.73 (*)    Hemoglobin 8.0 (*)    HCT 25.0 (*)    RDW 16.3 (*)    Monocytes Relative 14 (*)    All other components within normal limits  BASIC METABOLIC PANEL - Abnormal; Notable for the following:    Glucose, Bld 293 (*)     BUN 31 (*)    Creatinine, Ser 1.57 (*)    GFR calc non Af Amer 30 (*)    GFR calc Af Amer 35 (*)    All other components within normal limits  PRO B NATRIURETIC PEPTIDE - Abnormal; Notable for the following:    Pro B Natriuretic peptide (BNP) 1432.0 (*)    All other components within normal limits  RETICULOCYTES - Abnormal; Notable for the following:    Retic Ct Pct 3.9 (*)    RBC. 2.73 (*)    All other components within normal limits  GLUCOSE, CAPILLARY - Abnormal; Notable for the following:    Glucose-Capillary 314 (*)    All other components within normal limits  TROPONIN I  TROPONIN I  OCCULT BLOOD X 1 CARD TO LAB, STOOL  HEMOGLOBIN A1C  VITAMIN B12  FOLATE  IRON AND TIBC  FERRITIN  BASIC METABOLIC PANEL  CBC  TROPONIN I  TROPONIN I  HEMOGLOBIN AND HEMATOCRIT, BLOOD   EKG:  Rhythm: paced Rate: 90 ST segments: NS ST changes  Dg Chest 2 View  01/16/2013   *RADIOLOGY REPORT*  Clinical Data: Chest pain and weakness  CHEST -  2 VIEW  Comparison: 08/09/2012  Findings: There is a left chest wall pacer device with leads in the right atrial appendage, coronary sinus and right ventricle.  Previous median sternotomy and CABG procedure.  There is mild cardiac enlargement.  No pleural effusion or edema identified.  No airspace consolidation.  IMPRESSION:  1.  No acute cardiopulmonary abnormalities.   Original Report Authenticated By: Signa Kell, M.D.   1. DOE (dyspnea on exertion)   2. Atrial fibrillation   3. Chronic anemia   4. Chronic anticoagulation   5. Diabetes mellitus     MDM  80yF with primarily exertional dyspnea but also chest tightness. Likely multifactorial with numerous comorbidities. EKG paced. Trop normal x1. CXR w/o acute abnormality. Hemoglobin 8.0. Chronic anemia but baseline seems to be closer to 10. Santa Lighter, MD 01/17/13 [phone]

## 2013-01-17 ENCOUNTER — Encounter (HOSPITAL_COMMUNITY): Payer: Self-pay | Admitting: Physician Assistant

## 2013-01-17 ENCOUNTER — Inpatient Hospital Stay (HOSPITAL_COMMUNITY): Payer: Medicare Other

## 2013-01-17 DIAGNOSIS — R609 Edema, unspecified: Secondary | ICD-10-CM

## 2013-01-17 DIAGNOSIS — I2510 Atherosclerotic heart disease of native coronary artery without angina pectoris: Secondary | ICD-10-CM

## 2013-01-17 LAB — CBC
MCH: 30.1 pg (ref 26.0–34.0)
MCV: 90.7 fL (ref 78.0–100.0)
Platelets: 194 10*3/uL (ref 150–400)
RDW: 16.1 % — ABNORMAL HIGH (ref 11.5–15.5)
WBC: 7.2 10*3/uL (ref 4.0–10.5)

## 2013-01-17 LAB — PREPARE RBC (CROSSMATCH)

## 2013-01-17 LAB — HEMOGLOBIN A1C: Mean Plasma Glucose: 183 mg/dL — ABNORMAL HIGH (ref <117)

## 2013-01-17 LAB — IRON AND TIBC
Saturation Ratios: 21 % (ref 20–55)
TIBC: 297 ug/dL (ref 250–470)

## 2013-01-17 LAB — BASIC METABOLIC PANEL
BUN: 32 mg/dL — ABNORMAL HIGH (ref 6–23)
Calcium: 8.6 mg/dL (ref 8.4–10.5)
Chloride: 101 mEq/L (ref 96–112)
Potassium: 3.3 mEq/L — ABNORMAL LOW (ref 3.5–5.1)
Sodium: 139 mEq/L (ref 135–145)

## 2013-01-17 LAB — GLUCOSE, CAPILLARY
Glucose-Capillary: 92 mg/dL (ref 70–99)
Glucose-Capillary: 127 mg/dL — ABNORMAL HIGH (ref 70–99)
Glucose-Capillary: 279 mg/dL — ABNORMAL HIGH (ref 70–99)
Glucose-Capillary: 266 mg/dL — ABNORMAL HIGH (ref 70–99)

## 2013-01-17 LAB — VITAMIN B12: Vitamin B-12: 672 pg/mL (ref 211–911)

## 2013-01-17 LAB — FOLATE: Folate: >20 ng/mL

## 2013-01-17 MED ORDER — FUROSEMIDE 10 MG/ML IJ SOLN
20.0000 mg | Freq: Once | INTRAMUSCULAR | Status: AC
  Administered 2013-01-17: 20 mg via INTRAVENOUS

## 2013-01-17 MED ORDER — TECHNETIUM TO 99M ALBUMIN AGGREGATED
4.8000 | Freq: Once | INTRAVENOUS | Status: AC | PRN
  Administered 2013-01-17: 5 via INTRAVENOUS

## 2013-01-17 MED ORDER — WARFARIN SODIUM 2.5 MG PO TABS
12.5000 mg | ORAL_TABLET | Freq: Once | ORAL | Status: AC
  Administered 2013-01-17: 12.5 mg via ORAL
  Filled 2013-01-17: qty 1

## 2013-01-17 MED ORDER — TECHNETIUM TC 99M DIETHYLENETRIAME-PENTAACETIC ACID
43.7000 | Freq: Once | INTRAVENOUS | Status: AC | PRN
  Administered 2013-01-17: 43.7 via INTRAVENOUS

## 2013-01-17 MED ORDER — WARFARIN - PHARMACIST DOSING INPATIENT: Freq: Every day | Status: DC

## 2013-01-17 NOTE — Progress Notes (Signed)
PT Cancellation Note  Patient Details Name: [name] MRN: [id number] DOB: 18-Jul-1932   Cancelled Treatment:    Reason Eval/Treat Not Completed: Medical issues which prohibited therapy Per chart review, plan to transfuse 1 unit PRBC and arrange V/Q scan as well as LE venous dopplers.      Lavana Huckeba,KATHrine E 01/17/2013, 11:47 AM Zenovia Jarred, PT, DPT 01/17/2013 Pager: [phone]

## 2013-01-17 NOTE — Progress Notes (Signed)
[facility] PROGRESS NOTE  CALEY CIARAMITARO WUJ:[id number] DOB: 1933-05-16 DOA: 01/16/2013 PCP: [name], MD  Assessment/Plan: Active Problems:   Atrial fibrillation   Chronic anticoagulation   CKD (chronic kidney disease) stage 3, GFR 30-59 ml/min   Diabetes mellitus   Coronary artery disease   DOE (dyspnea on exertion)    1. Dyspnea/Chest tightness: Patient presented with progressive SOBOE/dereased effort tolerance and fatigue over approximately 1 month, now worse, associated with chest discomfort. Cardiac enzymes remained unelevated, and she is currently in SR. CXR is devoid of acute findings. Chest is clinically clear to auscultation. She does have a moderate anemia, so this is the likely culprit, although as her INR is sub-therapeutic at 1. 59, PE is a possibility. We shall transfuse 1 unit PRBC and arrange V/Q scan as well as LE venous dopplers. Cardiology service will be consulted, given her complex cardiac history. Of note, last echo in 08/2012 showed EF of 45% with diastolic dysfunction. Will defer to cardiology as to whether this needs repeating.  2. Anemia: patient has a known history of chronic anemia, possibly due to chronic disease, as well as known hemorrhoids (she has recently noted small amounts of blood in stools). Anemia work up is in progress. Apparently, her last colonoscopy was 5 years ago by Dr Lottie Mussel. As discussed in #1, we shall transfuse 1 unit PRBC.  3. Chronic systolic/diastolic heart failure: Clinically, patient does not appear to be in overt CHF decompensation, although Pro BNP is elevated at 1432. Continued on pre-admission dose of Lasix.  4. COPD: Stable/MNo clinical exacerbation. On prn Nebs.  5. Diabetes mellitus: This is insulin-requiring type 2. HBA1C is 8.0. Appeared uncontrolled at presentation, based on random blood glucose of 314. Now on diet, Lantus/SSI, with satisfactory control.  5. CKD stage 3: Baseline creatinine is 1.5-1.6 (in 09/2012)  Appears at baseline. Following renal indices.  6. Atrial fibrillation: In SR/Rate-controlled on beta-blocker/Cardizem CD, which we have continued. Coumadin per Pharmacy.  7. Hypothyroidism: continued on thyroxine replacement therapy. Check TSH.    Code Status: Full Code.  Family Communication:  Disposition Plan: To be determined.    Brief narrative: 79 y.o. female with prior h/o hypertension, dyslipidemia, DM with neuropathy, Hypothyroidism, chronic anemia, chronic systolic/diastolic heart failure, CAD/ischemic cardiomyopathy, s/P CABG, Mod MR/Severe TR, COPD, CVA, GERD, atrial fibrillation/Flutter, s/p PPM 08/2012, on coumadin, follows with Dr [place], came in for worsening DOE, and chest tightness for the last few weeks. No syncope, or palpitations at rest. Reports palpitations with DOE. No fever or cough. Pedal edema the same from before. No change in medications recently, no change in diet. No headache or nausea or vomiting. On arrival to ED, her ProBNP is better than the previous one. Her CXR does not show pulmonary edema or congestion. She doesn't appear to be in heart failure. Her labs revealed hemoglobin of 8.0 Drop from 10.0 She reports occasional blood in the stools. She has a h/o hemorrhoids. Admitted to medical service for further management.   Consultants:  N/A.   Procedures:  CXR.   Antibiotics:  N/A.   HPI/Subjective: No new issues.   Objective: Vital signs in last 24 hours: Temp:  [97.9 F (36.6 C)-98.4 F (36.9 C)] 98.3 F (36.8 C) (07/17 0441) Pulse Rate:  [90-91] 91 (07/17 0441) Resp:  [16-21] 16 (07/17 0441) BP: (123-143)/(57-66) 123/63 mmHg (07/17 0441) SpO2:  [96 %-100 %] 96 % (07/17 0441) Weight:  [96.1 kg (211 lb 13.8 oz)] 96.1 kg (211 lb 13.8 oz) (07/16  2228) Weight change:  Last BM Date: 01/17/13  Intake/Output from previous day:   Total I/O In: 240 [P.O.:240] Out: 400 [Urine:400]   Physical Exam: General: Comfortable, alert, communicative,  fully oriented, not short of breath at rest., but appears short f breath on minimal exertion.  HEENT:  Moderate clinical pallor, no jaundice, no conjunctival injection or discharge. NECK:  Supple, JVP not seen, no carotid bruits, no palpable lymphadenopathy, no palpable goiter. CHEST:  Clinically clear to auscultation, no wheezes, no crackles. HEART:  Sounds 1 and 2 heard, normal, regular, systolic murmur at base/apex. . ABDOMEN:  Obese, soft, non-tender, no palpable organomegaly, no palpable masses, normal bowel sounds. GENITALIA:  Not examined. LOWER EXTREMITIES:  Moderate pitting edema, palpable peripheral pulses. MUSCULOSKELETAL SYSTEM:  Generalized osteoarthritic changes, otherwise, normal. CENTRAL NERVOUS SYSTEM:  No focal neurologic deficit on gross examination.  Lab Results:  Recent Labs  01/16/13 1715 01/17/13 0432  WBC 7.0 7.2  HGB 8.0* 7.8*  HCT 25.0* 23.5*  PLT 225 194    Recent Labs  01/16/13 1715 01/17/13 0432  NA 135 139  K 3.8 3.3*  CL 98 101  CO2 27 29  GLUCOSE 293* 144*  BUN 31* 32*  CREATININE 1.57* 1.66*  CALCIUM 8.6 8.6   No results found for this or any previous visit (from the past 240 hour(s)).   Studies/Results: Dg Chest 2 View  01/16/2013   *RADIOLOGY REPORT*  Clinical Data: Chest pain and weakness  CHEST - 2 VIEW  Comparison: 08/09/2012  Findings: There is a left chest wall pacer device with leads in the right atrial appendage, coronary sinus and right ventricle.  Previous median sternotomy and CABG procedure.  There is mild cardiac enlargement.  No pleural effusion or edema identified.  No airspace consolidation.  IMPRESSION:  1.  No acute cardiopulmonary abnormalities.   Original Report Authenticated By: Signa Kell, M.D.    Medications: Scheduled Meds: . amitriptyline  75 mg Oral QHS  . aspirin EC  81 mg Oral q morning - 10a  . atorvastatin  40 mg Oral QPM  . diltiazem  240 mg Oral Daily  . donepezil  5 mg Oral QHS  . ezetimibe  10 mg  Oral QPM  . furosemide  60 mg Oral q morning - 10a  . insulin aspart  0-5 Units Subcutaneous QHS  . insulin aspart  0-9 Units Subcutaneous TID WC  . insulin glargine  35 Units Subcutaneous BID  . levothyroxine  100 mcg Oral QAC breakfast  . metoprolol succinate  50 mg Oral q morning - 10a  . pantoprazole  40 mg Oral Daily  . potassium chloride SA  10 mEq Oral q morning - 10a  . sodium chloride  3 mL Intravenous Q12H   Continuous Infusions:  PRN Meds:.nitroGLYCERIN    LOS: 1 day   Starleen Trussell,CHRISTOPHER  [facility] Pager [phone]. If 8PM-8AM, please contact night-coverage at [email or web address], password [facility] 01/17/2013, 9:20 AM  LOS: 1 day

## 2013-01-17 NOTE — Consult Note (Signed)
CARDIOLOGY CONSULT NOTE    Patient ID: [name] MRN: [id number] DOB/AGE: 1932/09/11 79 y.o.  Admit date: 01/16/2013 Referring Physician:  Brien Few Primary Physician: [name], MD Primary Cardiologist:  [place] Reason for Consultation: Dyspnea  Active Problems:   Atrial fibrillation   Chronic anticoagulation   CKD (chronic kidney disease) stage 3, GFR 30-59 ml/min   Diabetes mellitus   Coronary artery disease   DOE (dyspnea on exertion)   HPI:   79 yo admitted by primary service for dyspnea. Found to be markedly anemic. She has had atrial fib and CHF. EF was down to 30%. Not a candidate for antiarrhythmics due to her multiple comorbidities/intolerances. A BiV pacemaker was implanted followed by AV nodal ablation in December 2013. This was complicated by a repeat admission for pneumonia and bacteremia. Echo was repeated in February 2014 and had improved with an EF up to 45 to 50%. BNP was 1432.  R/O  She is in radiology now getting V/Q scan  CXR with NAD and no CHF.  Weight has been stable. She denies PND orthopnea or more LE edema  Compliant with meds including lasix she takes 60 mg daily.  No cough or fever. No pleuritic pain.  Denies SSCP and troponins negative     @ROS @ All other systems reviewed and negative except as noted above  Past Medical History  Diagnosis Date  . Coronary artery disease     a. Severe two vessel; s/p CABG (LIMA-LAD, SVG-diag, SVG-PDA);  b. 05/2012 NSTEMI in setting of rapid aflutter;  c. 06/2012 Lexiscan cardiolite: small, mild reversible defect in lateral wall->mild ischemia, low-risk->med Rx.  Marland Kitchen Hypertension   . Type II diabetes mellitus   . Hyperlipidemia   . Diabetic neuropathy   . CVA (cerebral vascular accident) 2004  . Hypothyroidism   . Chronic anemia   . GERD (gastroesophageal reflux disease)   . Arthritis   . Memory loss, short term   . COPD (chronic obstructive pulmonary disease)   . Blood transfusion without reported diagnosis     . Chronic combined systolic and diastolic CHF (congestive heart failure)     a. 07/2012 Echo: EF 30%, Gr II DD, Mild AS/MR, Mod-Sev TR, mild bi-atrial and RV dil, PASP .  . Moderate mitral regurgitation     a. mod by TEE 2012, mild by echo 2013, 07/2012  . Severe tricuspid regurgitation     a. Severe by TEE 2012, mod by echo 2013, mod-sev by echo 07/2012  . Chronic vulvitis 07/1993  . Idiopathic anemia 2006  . Atrial fibrillation     a. Failed amiodarone (prolonged QT), not a candidate for class IC meds due to her CAD. No Multaq due to CHF hx;  b. 07/2012 s/p SJM Anthem RF Bi-V PPM, ser # [id number] and AV node RFCA;  c. chronic coumadin.  . Atrial flutter   . Pacemaker   . GI bleed     a. 10/2011 - Dr Elnoria Howard did scope which showed hiatal hernia, colon revealed diverticula (diverticular bleed), internal/ext hemorrhoids.    Family History  Problem Relation Age of Onset  . Colon cancer Mother   . Stroke Mother   . Hypertension Mother   . Cancer Mother     breast  . Cancer Father     colon  . Heart attack Father     x2  . Renal Disease Sister     dialysis-twin sister  . Renal Disease Sister     renal bypass  History   Social History  . Marital Status: Married    Spouse Name: N/A    Number of Children: 2  . Years of Education: N/A   Occupational History  . teacher    Social History Main Topics  . Smoking status: Former Smoker    Types: Cigarettes  . Smokeless tobacco: Never Used  . Alcohol Use: No  . Drug Use: No  . Sexually Active: No     Comment: hysterectomy   Other Topics Concern  . Not on file   Social History Narrative   Lives in [place] with spouse    Past Surgical History  Procedure Laterality Date  . Coronary artery bypass graft  06/2009    X3, LIMA to LAD, SVG to diagonal, SVG to the posterior descending artery.  . Tonsillectomy    . Knee arthroscopy  08/2001  . Transthoracic echocardiogram  11/12/2010     Ejection fraction felt to be around 50%.   Wall thickness was increased in a pattern of mild LVH.  Moderately dilated left atrium.  Mildly dilated right atrium. Right ventricle was mildly dilated with mildly reduced systolic function  . Cardiac catheterization  06/09/2009    inferior wall hypokinesia with ejection fraction of 55%.  . Abdominal hysterectomy    . Joint replacement    . Tee without cardioversion  05/23/2011    Procedure: TRANSESOPHAGEAL ECHOCARDIOGRAM (TEE);  Surgeon: Lewayne Bunting, MD;  Location: [facility] ENDOSCOPY;  Service: Cardiovascular;  Laterality: N/A;  . Incision and drainage abscess  05/07/2012    Procedure: INCISION AND DRAINAGE ABSCESS;  Surgeon: Ardeth Sportsman, MD;  Location: [facility] OR;  Service: General;  Laterality: N/A;  Groin wound  . Total abdominal hysterectomy  1979  . Breast biopsy  Left  . Bilateral salpingectomy  10/2000  . Breast biopsy  09/18/2009    fibrocystic change  . Coronary artery bypass graft  06/2009  . Pacemaker insertion  08/2012     . amitriptyline  75 mg Oral QHS  . aspirin EC  81 mg Oral q morning - 10a  . atorvastatin  40 mg Oral QPM  . diltiazem  240 mg Oral Daily  . donepezil  5 mg Oral QHS  . ezetimibe  10 mg Oral QPM  . furosemide  20 mg Intravenous Once  . furosemide  60 mg Oral q morning - 10a  . insulin aspart  0-5 Units Subcutaneous QHS  . insulin aspart  0-9 Units Subcutaneous TID WC  . insulin glargine  35 Units Subcutaneous BID  . levothyroxine  100 mcg Oral QAC breakfast  . metoprolol succinate  50 mg Oral q morning - 10a  . pantoprazole  40 mg Oral Daily  . potassium chloride SA  10 mEq Oral q morning - 10a  . sodium chloride  3 mL Intravenous Q12H  . warfarin  12.5 mg Oral Once  . Warfarin - Pharmacist Dosing Inpatient   Does not apply q1800      Physical Exam: Blood pressure 123/63, pulse 91, temperature 98.3 F (36.8 C), temperature source Oral, resp. rate 16, height 5\' 3"  (1.6 m), weight 211 lb 13.8 oz (96.1 kg), SpO2 96.00%.  Affect appropriate Healthy:   appears stated age HEENT: normal Neck supple with no adenopathy JVP normal no bruits no thyromegaly Lungs clear with no wheezing and good diaphragmatic motion Heart:  S1/S2 systolic murmur, no rub, gallop or click PMI normal Abdomen: benighn, BS positve, no tenderness, no AAA no bruit.  No HSM or HJR Distal pulses intact with no bruits No edema Neuro non-focal Skin warm and dry No muscular weakness   Labs:   Lab Results  Component Value Date   WBC 7.2 01/17/2013   HGB 7.8* 01/17/2013   HCT 23.5* 01/17/2013   MCV 90.7 01/17/2013   PLT 194 01/17/2013    Recent Labs Lab 01/17/13 0432  NA 139  K 3.3*  CL 101  CO2 29  BUN 32*  CREATININE 1.66*  CALCIUM 8.6  GLUCOSE 144*   Lab Results  Component Value Date   CKTOTAL 169 11/12/2010   CKMB 3.3 11/12/2010   TROPONINI <0.30 01/17/2013    Lab Results  Component Value Date   CHOL 160 05/26/2012   CHOL  Value: 173 (NOTE) ATP III Classification:      < 200        mg/dL        Desirable     161 - 239     mg/dL        Borderline High     >= 240        mg/dL        High  03/09/453   Lab Results  Component Value Date   HDL 29* 05/26/2012   HDL 38* 06/08/2009   Lab Results  Component Value Date   LDLCALC 115* 05/26/2012   LDLCALC  Value: 116 (NOTE)  Total Cholesterol/HDL Ratio:CHD Risk                       Coronary Heart Disease Risk Table                                       Men       Women         1/2 Average Risk              3.4        3.3             Average Risk              5.0         4.4         2 X Average Risk              9.6        7.1         3 X Average Risk             23.4       11.0 Use the calculated Patient Ratio above and the CHD Risk table  to determine the patient's CHD Risk. ATP III Classification (LDL):      < 100         mg/dL         Optimal     098 - 129     mg/dL         Near or Above Optimal     130 - 159     mg/dL         Borderline High     160 - 189     mg/dL         High      > 119        mg/dL          Very High * 14/01/8294  Lab Results  Component Value Date   TRIG 80 05/26/2012   TRIG 95 06/08/2009   Lab Results  Component Value Date   CHOLHDL 5.5 05/26/2012   CHOLHDL 4.6 06/08/2009   No results found for this basename: LDLDIRECT      Radiology: Dg Chest 2 View  01/16/2013   *RADIOLOGY REPORT*  Clinical Data: Chest pain and weakness  CHEST - 2 VIEW  Comparison: 08/09/2012  Findings: There is a left chest wall pacer device with leads in the right atrial appendage, coronary sinus and right ventricle.  Previous median sternotomy and CABG procedure.  There is mild cardiac enlargement.  No pleural effusion or edema identified.  No airspace consolidation.  IMPRESSION:  1.  No acute cardiopulmonary abnormalities.   Original Report Authenticated By: Signa Kell, M.D.    EKG:  V pacing   ASSESSMENT AND PLAN:  Dyspnea:  Not likely from exacerbation of CHF which would be more diastolic.  Continue current dose of lasix and give additional if blood transfusion needed.  Suspect anemia plays more of a role V/Q scan pending but low likelyhood on coumadin before admission SSS/AFib:  guaic stools hold anticoagulation if GI procedure anticipated  Chol:  Continue statin  Pacer:  Normal function on recent office visit with GT Can consider increasing LR limit   Signed: Charlton Haws 01/17/2013, 1:42 PM

## 2013-01-17 NOTE — Progress Notes (Addendum)
ANTICOAGULATION CONSULT NOTE - Initial Consult  Pharmacy Consult for Coumadin Indication: atrial fibrillation and CVA  Allergies  Allergen Reactions  . Betadine (Povidone Iodine) Itching  . Capoten (Captopril) Other (See Comments)    unknown  . Codone (Hydrocodone) Nausea And Vomiting  . Methadone Nausea And Vomiting  . Penicillins Swelling  . Propoxyphene And Methadone     Intolerance to darvocet    Patient Measurements: Height: 5\' 3"  (160 cm) Weight: 211 lb 13.8 oz (96.1 kg) IBW/kg (Calculated) : 52.4  Vital Signs: Temp: 98.3 F (36.8 C) (07/17 0441) Temp src: Oral (07/17 0441) BP: 123/63 mmHg (07/17 0441) Pulse Rate: 91 (07/17 0441)  Labs:  Recent Labs  01/16/13 1715 01/16/13 2320 01/17/13 0432 01/17/13 1025  HGB 8.0*  --  7.8*  --   HCT 25.0*  --  23.5*  --   PLT 225  --  194  --   LABPROT 18.5*  --   --   --   INR 1.59*  --   --   --   CREATININE 1.57*  --  1.66*  --   TROPONINI <0.30 <0.30 <0.30 <0.30    Estimated Creatinine Clearance: 30.8 ml/min (by C-G formula based on Cr of 1.66).   Medical History: Past Medical History  Diagnosis Date  . Coronary artery disease     a. Severe two vessel; s/p CABG;  b. 05/2012 NSTEMI in setting of rapid aflutter;  c. 06/2012 Lexiscan cardiolite: small, mild reversible defect in lateral wall->mild ischemia, low-risk->med Rx.  Marland Kitchen Hypertension   . Type II diabetes mellitus   . Hyperlipidemia   . Diabetic neuropathy   . CVA (cerebral vascular accident) 2004  . Hypothyroidism   . Chronic anemia   . GERD (gastroesophageal reflux disease)   . Arthritis   . Memory loss, short term   . COPD (chronic obstructive pulmonary disease)   . Blood transfusion without reported diagnosis   . Chronic combined systolic and diastolic CHF (congestive heart failure)     a. 07/2012 Echo: EF 30%, Gr II DD, Mild AS/MR, Mod-Sev TR, mild bi-atrial and RV dil, PASP .  . Moderate mitral regurgitation     a. mod by TEE 2012, mild by  echo 2013, 07/2012  . Severe tricuspid regurgitation     a. Severe by TEE 2012, mod by echo 2013, mod-sev by echo 07/2012  . Chronic vulvitis 07/1993  . Idiopathic anemia 2006  . Atrial fibrillation     a. Failed amiodarone (prolonged QT), not a candidate for class IC meds due to her CAD. No Multaq due to CHF hx;  b. 07/2012 s/p SJM Anthem RF Bi-V PPM, ser # [id number] and AV node RFCA;  b. chronic coumadin.  . Atrial flutter   . Pacemaker     Assessment: 73 yof presented 7/16 with c/o dyspnea and chest tightness. Patient was on Coumadin PTA for h/o afib and CVA. INR 1.59 on admit, last Coumadin dose 7/15. Per patient and [facility] note 7/8, patient was on Coumadin 7.5mg  daily except 10mg  on Wed/Sat and INR has been therapeutic. Admitting MD wrote Coumadin per pharmacy but did not order consult.   Today, Ggb 7.8, no active bleeding noted -  transfusing 1 unit of PRBCs. Since INR is subtherapeutic, MD is concerning for PE/DVT. Pharmacy paged Dr. Brien Few and received telephone order to dose Coumadin.   INR was 1.59 yesterday, patient had multiple blood draws today already and is ready for PRBCs now so will  not recheck PT/INR again  Goal of Therapy:  INR 2-3 Monitor platelets by anticoagulation protocol: Yes   Plan:   Coumadin 12.5mg  po x 1 to be given now instead of 1800 as usual  Daily PT/INR  F/u doppler and V/Q scan to r/o new VTe  Geoffry Paradise, PharmD, BCPS Pager: [phone] 11:38 AM Pharmacy #: [id number]

## 2013-01-17 NOTE — Progress Notes (Signed)
VASCULAR LAB PRELIMINARY  PRELIMINARY  PRELIMINARY  PRELIMINARY  Bilateral lower extremity venous duplex completed.    Preliminary report:  Bilateral:  No evidence of DVT, superficial thrombosis, or Baker's Cyst.   Lianna Sitzmann, RVS 01/17/2013, 2:15 PM

## 2013-01-17 NOTE — Plan of Care (Signed)
Problem: Consults Goal: General Medical Patient Education See Patient Education Module for specific education. Outcome: Progressing Pt instructed on when to call for the nurse or tech, medications that she will be getting,and  fall safety plan. Goal: Nutrition Consult-if indicated Outcome: Progressing Pt instructed that her diet will be low sodium along with decreased in carbs because of her elevated BNP and diabetes.

## 2013-01-18 LAB — CBC
Hemoglobin: 8.9 g/dL — ABNORMAL LOW (ref 12.0–15.0)
MCH: 29.1 pg (ref 26.0–34.0)
Platelets: 216 10*3/uL (ref 150–400)
RBC: 3.06 MIL/uL — ABNORMAL LOW (ref 3.87–5.11)
WBC: 6.9 10*3/uL (ref 4.0–10.5)

## 2013-01-18 LAB — PROTIME-INR
INR: 1.47 (ref 0.00–1.49)
Prothrombin Time: 17.4 seconds — ABNORMAL HIGH (ref 11.6–15.2)

## 2013-01-18 LAB — BASIC METABOLIC PANEL
CO2: 29 mEq/L (ref 19–32)
Calcium: 8.9 mg/dL (ref 8.4–10.5)
Chloride: 101 mEq/L (ref 96–112)
Glucose, Bld: 146 mg/dL — ABNORMAL HIGH (ref 70–99)
Sodium: 139 mEq/L (ref 135–145)

## 2013-01-18 LAB — OCCULT BLOOD X 1 CARD TO LAB, STOOL: Fecal Occult Bld: POSITIVE — AB

## 2013-01-18 LAB — PRO B NATRIURETIC PEPTIDE: Pro B Natriuretic peptide (BNP): 992.6 pg/mL — ABNORMAL HIGH (ref 0–450)

## 2013-01-18 LAB — GLUCOSE, CAPILLARY
Glucose-Capillary: 116 mg/dL — ABNORMAL HIGH (ref 70–99)
Glucose-Capillary: 184 mg/dL — ABNORMAL HIGH (ref 70–99)

## 2013-01-18 MED ORDER — WARFARIN SODIUM 2.5 MG PO TABS
12.5000 mg | ORAL_TABLET | Freq: Once | ORAL | Status: DC
  Filled 2013-01-18: qty 1

## 2013-01-18 MED ORDER — SODIUM CHLORIDE 0.9 % IV SOLN
INTRAVENOUS | Status: DC
  Administered 2013-01-18: 16:00:00 via INTRAVENOUS

## 2013-01-18 MED ORDER — POTASSIUM CHLORIDE CRYS ER 10 MEQ PO TBCR
10.0000 meq | EXTENDED_RELEASE_TABLET | Freq: Once | ORAL | Status: AC
  Administered 2013-01-18: 10 meq via ORAL
  Filled 2013-01-18: qty 1

## 2013-01-18 MED ORDER — PEG 3350-KCL-NA BICARB-NACL 420 G PO SOLR
4000.0000 mL | Freq: Once | ORAL | Status: AC
  Administered 2013-01-18: 4000 mL via ORAL

## 2013-01-18 NOTE — Consult Note (Addendum)
UNASSIGNED PATIENT  Reason for Consult: Anemia and Hematochezia Referring Physician: [facility]  [name] HPI: This is a 80 year female with a history of GI bleed admitted for complaints of SOB.  She has a history of CHF and atrial fibrillation, however, Cardiology does not feel that her SOB was secondary to a cardiac issue.  She was negative for any evidence of PE.  Over the course of this hospitalization she was noted to have a mild drop in her HGB.  Her baseline tends to be in the 10 range, but it has fluctuated in the past from the 7-11 range.  Recently she reports some hematochezia.  She last evaluated by myself in 08/2011 for complaints of hematochezia when she was admitted as an unassigned patient.  Her INR at that time was 2.1.  She was identified to have diverticula and it was felt that she had a diverticular bleeding.  Prior to that time Dr. Medoff worked her up for anemia, but the results are not available.  Past Medical History  Diagnosis Date  . Coronary artery disease     a. Severe two vessel; s/p CABG (LIMA-LAD, SVG-diag, SVG-PDA);  b. 05/2012 NSTEMI in setting of rapid aflutter;  c. 06/2012 Lexiscan cardiolite: small, mild reversible defect in lateral wall->mild ischemia, low-risk->med Rx.  . Hypertension   . Type II diabetes mellitus   . Hyperlipidemia   . Diabetic neuropathy   . CVA (cerebral vascular accident) 2004  . Hypothyroidism   . Chronic anemia   . GERD (gastroesophageal reflux disease)   . Arthritis   . Memory loss, short term   . COPD (chronic obstructive pulmonary disease)   . Blood transfusion without reported diagnosis   . Chronic combined systolic and diastolic CHF (congestive heart failure)     a. 07/2012 Echo: EF 30%, Gr II DD, Mild AS/MR, Mod-Sev TR, mild bi-atrial and RV dil, PASP 34mmHg.  . Moderate mitral regurgitation     a. mod by TEE 2012, mild by echo 2013, 07/2012  . Severe tricuspid regurgitation     a. Severe by TEE 2012, mod by  echo 2013, mod-sev by echo 07/2012  . Chronic vulvitis 07/1993  . Idiopathic anemia 2006  . Atrial fibrillation     a. Failed amiodarone (prolonged QT), not a candidate for class IC meds due to her CAD. No Multaq due to CHF hx;  b. 07/2012 s/p SJM Anthem RF Bi-V PPM, ser # [id number] and AV node RFCA;  c. chronic coumadin.  . Atrial flutter   . Pacemaker   . GI bleed     a. 10/2011 - Dr Frederika Hukill did scope which showed hiatal hernia, colon revealed diverticula (diverticular bleed), internal/ext hemorrhoids.    Past Surgical History  Procedure Laterality Date  . Coronary artery bypass graft  06/2009    X3, LIMA to LAD, SVG to diagonal, SVG to the posterior descending artery.  . Tonsillectomy    . Knee arthroscopy  08/2001  . Transthoracic echocardiogram  11/12/2010     Ejection fraction felt to be around 50%.  Wall thickness was increased in a pattern of mild LVH.  Moderately dilated left atrium.  Mildly dilated right atrium. Right ventricle was mildly dilated with mildly reduced systolic function  . Cardiac catheterization  06/09/2009    inferior wall hypokinesia with ejection fraction of 55%.  . Abdominal hysterectomy    . Joint replacement    . Tee without cardioversion  05/23/2011    Procedure: TRANSESOPHAGEAL   ECHOCARDIOGRAM (TEE);  Surgeon: Brian S Crenshaw, MD;  Location: [facility];  Service: Cardiovascular;  Laterality: N/A;  . Incision and drainage abscess  05/07/2012    Procedure: INCISION AND DRAINAGE ABSCESS;  Surgeon: Steven C. Gross, MD;  Location: [facility] OR;  Service: General;  Laterality: N/A;  Groin wound  . Total abdominal hysterectomy  1979  . Breast biopsy  Left  . Bilateral salpingectomy  10/2000  . Breast biopsy  09/18/2009    fibrocystic change  . Coronary artery bypass graft  06/2009  . Pacemaker insertion  08/2012    Family History  Problem Relation Age of Onset  . Colon cancer Mother   . Stroke Mother   . Hypertension Mother   . Cancer Mother     breast  . Cancer Father      colon  . Heart attack Father     x2  . Renal Disease Sister     dialysis-twin sister  . Renal Disease Sister     renal bypass    Social History:  reports that she has quit smoking. Her smoking use included Cigarettes. She smoked 0.00 packs per day. She has never used smokeless tobacco. She reports that she does not drink alcohol or use illicit drugs.  Allergies:  Allergies  Allergen Reactions  . Betadine (Povidone Iodine) Itching  . Capoten (Captopril) Other (See Comments)    unknown  . Codone (Hydrocodone) Nausea And Vomiting  . Methadone Nausea And Vomiting  . Penicillins Swelling  . Propoxyphene And Methadone     Intolerance to darvocet    Medications:  Scheduled: . amitriptyline  75 mg Oral QHS  . aspirin EC  81 mg Oral q morning - 10a  . atorvastatin  40 mg Oral QPM  . diltiazem  240 mg Oral Daily  . donepezil  5 mg Oral QHS  . ezetimibe  10 mg Oral QPM  . furosemide  60 mg Oral q morning - 10a  . insulin aspart  0-5 Units Subcutaneous QHS  . insulin aspart  0-9 Units Subcutaneous TID WC  . insulin glargine  35 Units Subcutaneous BID  . levothyroxine  100 mcg Oral QAC breakfast  . metoprolol succinate  50 mg Oral q morning - 10a  . pantoprazole  40 mg Oral Daily  . potassium chloride SA  10 mEq Oral q morning - 10a  . potassium chloride  10 mEq Oral Once  . sodium chloride  3 mL Intravenous Q12H  . warfarin  12.5 mg Oral ONCE-1800  . Warfarin - Pharmacist Dosing Inpatient   Does not apply q1800   Continuous:   Results for orders placed during the hospital encounter of 01/16/13 (from the past 24 hour(s))  GLUCOSE, CAPILLARY     Status: Abnormal   Collection Time    01/17/13  5:11 PM      Result Value Range   Glucose-Capillary 279 (*) 70 - 99 mg/dL  GLUCOSE, CAPILLARY     Status: Abnormal   Collection Time    01/17/13  9:48 PM      Result Value Range   Glucose-Capillary 266 (*) 70 - 99 mg/dL  CBC     Status: Abnormal   Collection Time    01/18/13   4:55 AM      Result Value Range   WBC 6.9  4.0 - 10.5 K/uL   RBC 3.06 (*) 3.87 - 5.11 MIL/uL   Hemoglobin 8.9 (*) 12.0 - 15.0 g/dL   HCT 27.6 (*)   36.0 - 46.0 %   MCV 90.2  78.0 - 100.0 fL   MCH 29.1  26.0 - 34.0 pg   MCHC 32.2  30.0 - 36.0 g/dL   RDW 16.3 (*) 11.5 - 15.5 %   Platelets 216  150 - 400 K/uL  BASIC METABOLIC PANEL     Status: Abnormal   Collection Time    01/18/13  4:55 AM      Result Value Range   Sodium 139  135 - 145 mEq/L   Potassium 3.3 (*) 3.5 - 5.1 mEq/L   Chloride 101  96 - 112 mEq/L   CO2 29  19 - 32 mEq/L   Glucose, Bld 146 (*) 70 - 99 mg/dL   BUN 33 (*) 6 - 23 mg/dL   Creatinine, Ser 1.78 (*) 0.50 - 1.10 mg/dL   Calcium 8.9  8.4 - 10.5 mg/dL   GFR calc non Af Amer 26 (*) >90 mL/min   GFR calc Af Amer 30 (*) >90 mL/min  PRO B NATRIURETIC PEPTIDE     Status: Abnormal   Collection Time    01/18/13  4:55 AM      Result Value Range   Pro B Natriuretic peptide (BNP) 992.6 (*) 0 - 450 pg/mL  PROTIME-INR     Status: Abnormal   Collection Time    01/18/13  4:55 AM      Result Value Range   Prothrombin Time 17.4 (*) 11.6 - 15.2 seconds   INR 1.47  0.00 - 1.49  GLUCOSE, CAPILLARY     Status: Abnormal   Collection Time    01/18/13  7:47 AM      Result Value Range   Glucose-Capillary 116 (*) 70 - 99 mg/dL   Comment 1 Notify RN    OCCULT BLOOD X 1 CARD TO LAB, STOOL     Status: Abnormal   Collection Time    01/18/13  9:45 AM      Result Value Range   Fecal Occult Bld POSITIVE (*) NEGATIVE  GLUCOSE, CAPILLARY     Status: Abnormal   Collection Time    01/18/13 11:57 AM      Result Value Range   Glucose-Capillary 161 (*) 70 - 99 mg/dL   Comment 1 Notify RN       Dg Chest 2 View  01/16/2013   *RADIOLOGY REPORT*  Clinical Data: Chest pain and weakness  CHEST - 2 VIEW  Comparison: 08/09/2012  Findings: There is a left chest wall pacer device with leads in the right atrial appendage, coronary sinus and right ventricle.  Previous median sternotomy and CABG  procedure.  There is mild cardiac enlargement.  No pleural effusion or edema identified.  No airspace consolidation.  IMPRESSION:  1.  No acute cardiopulmonary abnormalities.   Original Report Authenticated By: Taylor Stroud, M.D.   Nm Pulmonary Perf And Vent  01/17/2013   *RADIOLOGY REPORT*  Clinical Data:  Dyspnea on exersion , short of breath  NUCLEAR MEDICINE VENTILATION - PERFUSION LUNG SCAN  Technique:  Ventilation images were obtained in multiple projections using inhaled aerosol technetium 99 M DTPA.  Perfusion images were obtained in multiple projections after intravenous injection of Tc-99m MAA.  Radiopharmaceuticals:  43.7mCi Tc-99m DTPA aerosol and 4.8 mCi Tc- 99m MAA.  Comparison: None of  Findings:  Ventilation:   No focal ventilation defect.  Perfusion:   No wedge shaped peripheral perfusion defects to suggest acute pulmonary embolism  IMPRESSION:  No evidence of pulmonary embolism.     Original Report Authenticated By: Stewart Edmunds, M.D.    ROS:  As stated above in the HPI otherwise negative.  Blood pressure 132/61, pulse 91, temperature 98.4 F (36.9 C), temperature source Oral, resp. rate 16, height 5' 3" (1.6 m), weight 207 lb 14.3 oz (94.3 kg), SpO2 97.00%.    PE: Gen: NAD, Alert and Oriented HEENT:  [place]/AT, EOMI Neck: Supple, no LAD Lungs: CTA Bilaterally CV: RRR without M/G/R ABM: Soft, NTND, +BS Ext: No C/C/E  Assessment/Plan: 1) Anemia. 2) Hematochezia - Minimal.   No evidence of any significant bleeding at this time.  She feels much better after the blood transfusion.  I am not certain about the source of her anemia. Her hemoccult is pending at this time.    Plan: 1) Follow HGB. 2) Transfuse as necessary. 3) Supportive care at this time.  If the anemia should worsen, a repeat endoscopic examination may be warranted. 4) Await hemoccult.   Shawntay Prest D 01/18/2013, 1:34 PM   The patient is heme positive.  With the findings, I think it will be prudent to  reevaluate her with an EGD/Colonoscopy. 

## 2013-01-18 NOTE — Progress Notes (Signed)
Inpatient Diabetes Program Recommendations  AACE/ADA: New Consensus Statement on Inpatient Glycemic Control (2013)  Target Ranges:  Prepandial:   less than 140 mg/dL      Peak postprandial:   less than 180 mg/dL (1-2 hours)      Critically ill patients:  140 - 180 mg/dL   Reason for Visit: Hyperglycemia  Results for TERRINA, DOCTER (MRN [id number]) as of 01/18/2013 13:28  Ref. Range 01/17/2013 12:55 01/17/2013 17:11 01/17/2013 21:48 01/18/2013 07:47 01/18/2013 11:57  Glucose-Capillary Latest Range: 70-99 mg/dL 409 (H) 811 (H) 914 (H) 116 (H) 161 (H)    Inpatient Diabetes Program Recommendations Insulin - Meal Coverage: Add Novolog 4 units tidwc for meal coverage insulin if pt eats >50% meal HgbA1C: Doubtful HgbA1C of 8% is accurate since H/H is so low.  Note: Eating 100%.  Will continue to follow.  Thank you. Ailene Ards, RD, LDN, CDE Inpatient Diabetes Coordinator [phone]

## 2013-01-18 NOTE — Progress Notes (Signed)
Pharmacist Heart Failure Core Measure Documentation  Assessment: [name] has an EF documented as 30%  On ECHO 1/12 and EF of 45-50% on ECHO 2/514.   Rationale: Heart failure patients with left ventricular systolic dysfunction (LVSD) and an EF < 40% should be prescribed an angiotensin converting enzyme inhibitor (ACEI) or angiotensin receptor blocker (ARB) at discharge unless a contraindication is documented in the medical record.  This patient is not currently on an ACEI or ARB for HF.  This note is being placed in the record in order to provide documentation that a contraindication to the use of these agents is present for this encounter.  ACE Inhibitor or Angiotensin Receptor Blocker is contraindicated (specify all that apply)  []   ACEI allergy AND ARB allergy []   Angioedema []   Moderate or severe aortic stenosis []   Hyperkalemia []   Hypotension []   Renal artery stenosis [x]   Worsening renal function, preexisting renal disease or dysfunction   Geoffry Paradise Thi 01/18/2013 10:56 AM

## 2013-01-18 NOTE — Progress Notes (Signed)
SUBJECTIVE:  She feels much better after transfusion.  Breathing improved.    PHYSICAL EXAM Filed Vitals:   01/17/13 1700 01/17/13 1750 01/17/13 2144 01/18/13 0600  BP: 133/61 129/54 134/61 132/61  Pulse: 90 89 90 91  Temp: 97.8 F (36.6 C) 97.8 F (36.6 C) 98.5 F (36.9 C) 98.4 F (36.9 C)  TempSrc: Oral Oral Oral Oral  Resp: 16 16 16 16   Height:      Weight:    207 lb 14.3 oz (94.3 kg)  SpO2:  96% 97% 97%   General:  No distress Lungs:  Clear Heart:  RRR Abdomen:  Positive bowel sounds, no rebound no guarding\ Extremities:  Tense edema with chronic changes.  LABS:  Results for orders placed during the hospital encounter of 01/16/13 (from the past 24 hour(s))  GLUCOSE, CAPILLARY     Status: None   Collection Time    01/17/13  7:57 AM      Result Value Range   Glucose-Capillary 92  70 - 99 mg/dL  TROPONIN I     Status: None   Collection Time    01/17/13 10:25 AM      Result Value Range   Troponin I <0.30  <0.30 ng/mL  TYPE AND SCREEN     Status: None   Collection Time    01/17/13 11:10 AM      Result Value Range   ABO/RH(D) A POS     Antibody Screen NEG     Sample Expiration 01/20/2013     PT AG Type NEGATIVE FOR KELL ANTIGEN     Unit Number [id number]     Blood Component Type RED CELLS,LR     Unit division 00     Status of Unit ALLOCATED     Donor AG Type NEGATIVE FOR KELL ANTIGEN     Transfusion Status OK TO TRANSFUSE     Crossmatch Result COMPATIBLE     Unit Number [id number]     Blood Component Type RED CELLS,LR     Unit division 00     Status of Unit ISSUED,FINAL     Donor AG Type NEGATIVE FOR KELL ANTIGEN     Transfusion Status OK TO TRANSFUSE     Crossmatch Result COMPATIBLE    PREPARE RBC (CROSSMATCH)     Status: None   Collection Time    01/17/13 11:10 AM      Result Value Range   Order Confirmation ORDER PROCESSED BY BLOOD BANK    GLUCOSE, CAPILLARY     Status: Abnormal   Collection Time    01/17/13 12:55 PM      Result Value  Range   Glucose-Capillary 127 (*) 70 - 99 mg/dL  GLUCOSE, CAPILLARY     Status: Abnormal   Collection Time    01/17/13 12:55 PM      Result Value Range   Glucose-Capillary 127 (*) 70 - 99 mg/dL  GLUCOSE, CAPILLARY     Status: Abnormal   Collection Time    01/17/13  5:11 PM      Result Value Range   Glucose-Capillary 279 (*) 70 - 99 mg/dL  GLUCOSE, CAPILLARY     Status: Abnormal   Collection Time    01/17/13  9:48 PM      Result Value Range   Glucose-Capillary 266 (*) 70 - 99 mg/dL  CBC     Status: Abnormal   Collection Time    01/18/13  4:55 AM      Result Value  Range   WBC 6.9  4.0 - 10.5 K/uL   RBC 3.06 (*) 3.87 - 5.11 MIL/uL   Hemoglobin 8.9 (*) 12.0 - 15.0 g/dL   HCT 16.1 (*) 09.6 - 04.5 %   MCV 90.2  78.0 - 100.0 fL   MCH 29.1  26.0 - 34.0 pg   MCHC 32.2  30.0 - 36.0 g/dL   RDW 40.9 (*) 81.1 - 91.4 %   Platelets 216  150 - 400 K/uL  BASIC METABOLIC PANEL     Status: Abnormal   Collection Time    01/18/13  4:55 AM      Result Value Range   Sodium 139  135 - 145 mEq/L   Potassium 3.3 (*) 3.5 - 5.1 mEq/L   Chloride 101  96 - 112 mEq/L   CO2 29  19 - 32 mEq/L   Glucose, Bld 146 (*) 70 - 99 mg/dL   BUN 33 (*) 6 - 23 mg/dL   Creatinine, Ser 7.82 (*) 0.50 - 1.10 mg/dL   Calcium 8.9  8.4 - 95.6 mg/dL   GFR calc non Af Amer 26 (*) >90 mL/min   GFR calc Af Amer 30 (*) >90 mL/min  PRO B NATRIURETIC PEPTIDE     Status: Abnormal   Collection Time    01/18/13  4:55 AM      Result Value Range   Pro B Natriuretic peptide (BNP) 992.6 (*) 0 - 450 pg/mL  PROTIME-INR     Status: Abnormal   Collection Time    01/18/13  4:55 AM      Result Value Range   Prothrombin Time 17.4 (*) 11.6 - 15.2 seconds   INR 1.47  0.00 - 1.49    Intake/Output Summary (Last 24 hours) at 01/18/13 0734 Last data filed at 01/18/13 0500  Gross per 24 hour  Intake    970 ml  Output   1800 ml  Net   -830 ml    ASSESSMENT AND PLAN:  Atrial fibrillation:  Paced rhythm on telemetry.  Warfarin  continues.  CKD (chronic kidney disease) stage 3, GFR 30-59 ml/min:  Creat up slightly.   DOE (dyspnea on exertion):  No evidence of DVT.  VQ no evidence of PE.  Felt to be multifactorial with anemia playing a significant role.  No change in therapy. Continue current Lasix. No further cardiac work up is planned.   EDEMA:  We discussed conservative treatment of this.      [name] 01/18/2013 7:34 AM

## 2013-01-18 NOTE — Progress Notes (Signed)
ANTICOAGULATION CONSULT NOTE - Follow UP  Pharmacy Consult for Coumadin Indication: atrial fibrillation and CVA  Allergies  Allergen Reactions  . Betadine (Povidone Iodine) Itching  . Capoten (Captopril) Other (See Comments)    unknown  . Codone (Hydrocodone) Nausea And Vomiting  . Methadone Nausea And Vomiting  . Penicillins Swelling  . Propoxyphene And Methadone     Intolerance to darvocet   Labs:  Recent Labs  01/16/13 1715 01/16/13 2320 01/17/13 0432 01/17/13 1025 01/18/13 0455  HGB 8.0*  --  7.8*  --  8.9*  HCT 25.0*  --  23.5*  --  27.6*  PLT 225  --  194  --  216  LABPROT 18.5*  --   --   --  17.4*  INR 1.59*  --   --   --  1.47  CREATININE 1.57*  --  1.66*  --  1.78*  TROPONINI <0.30 <0.30 <0.30 <0.30  --     Estimated Creatinine Clearance: 28.5 ml/min (by C-G formula based on Cr of 1.78).   Assessment: [name] presented 7/16 with c/o dyspnea and chest tightness. Patient was on Coumadin PTA for h/o afib and CVA. INR 1.59 on admit, last Coumadin dose 7/15. Per patient and [facility] note 7/8, patient was on Coumadin 7.5mg  daily except 10mg  on Wed/Sat and INR has been therapeutic. Coumadin consult not ordered to be resume until 7/17, patient missed 7/16 PM dose of Coumadin   Bilateral LE venous duplex negative for DVT, V/Q scan with no evidence of PE. INR 1.47 today, Scr trending up to 1.78, Hgb better s/p transfusion yesterday, plts okay, no reports of bleeding.   Goal of Therapy:  INR 2-3 Monitor platelets by anticoagulation protocol: Yes   Plan:   Repeat Coumadin 12.5mg  po x 1 tonight   Daily PT/INR  Pharmacy will f/u  Geoffry Paradise, PharmD, BCPS Pager: [phone] 8:34 AM Pharmacy #: [id number]

## 2013-01-18 NOTE — Evaluation (Signed)
Physical Therapy Evaluation Patient Details Name: [name] MRN: [id number] DOB: Apr 03, 1933 Today's Date: 01/18/2013 Time: 1215-1227 PT Time Calculation (min): 12 min  PT Assessment / Plan / Recommendation History of Present Illness  79 yo admitted by primary service for dyspnea. Found to be markedly anemic. She has had atrial fib and CHF. EF was down to 30%. Not a candidate for antiarrhythmics due to her multiple comorbidities/intolerances. A BiV pacemaker was implanted followed by AV nodal ablation in December 2013. This was complicated by a repeat admission for pneumonia and bacteremia. Echo was repeated in February 2014 and had improved with an EF up to 45 to 50%.  Clinical Impression  No further need for PT, one time eval completed; Pt's dopplers were negative for DVTs and VQ scan neg for PE; She does not feel dyspneic today with amb; Sats 97% on RA with amb;     PT Assessment  Patent does not need any further PT services    Follow Up Recommendations  No PT follow up    Does the patient have the potential to tolerate intense rehabilitation      Barriers to Discharge        Equipment Recommendations       Recommendations for Other Services     Frequency      Precautions / Restrictions Precautions Precautions: None   Pertinent Vitals/Pain       Mobility  Transfers Transfers: Sit to Stand;Stand to Sit Sit to Stand: 6: Modified independent (Device/Increase time) Stand to Sit: 6: Modified independent (Device/Increase time) Ambulation/Gait Ambulation/Gait Assistance: 7: Independent Ambulation Distance (Feet): 280 Feet Assistive device: None Gait Pattern: Within Functional Limits    Exercises     PT Diagnosis:    PT Problem List:   PT Treatment Interventions:       PT Goals(Current goals can be found in the care plan section)    Visit Information  Last PT Received On: 01/18/13 Assistance Needed: +1 History of Present Illness: 79 yo admitted by  primary service for dyspnea. Found to be markedly anemic. She has had atrial fib and CHF. EF was down to 30%. Not a candidate for antiarrhythmics due to her multiple comorbidities/intolerances. A BiV pacemaker was implanted followed by AV nodal ablation in December 2013. This was complicated by a repeat admission for pneumonia and bacteremia. Echo was repeated in February 2014 and had improved with an EF up to 45 to 50%.       Prior Functioning  Home Living Family/patient expects to be discharged to:: Private residence Living Arrangements: Spouse/significant other Available Help at Discharge: Family;Available 24 hours/day Type of Home: House Home Access: Stairs to enter [employer] of Steps: 2 Entrance Stairs-Rails: Right;Left;Can reach both Home Layout: One level Prior Function Level of Independence: Independent Communication Communication: No difficulties    Cognition  Cognition Arousal/Alertness: Awake/alert Behavior During Therapy: WFL for tasks assessed/performed Overall Cognitive Status: Within Functional Limits for tasks assessed    Extremity/Trunk Assessment Upper Extremity Assessment Upper Extremity Assessment: Overall WFL for tasks assessed Lower Extremity Assessment Lower Extremity Assessment: Overall WFL for tasks assessed   Balance Static Standing Balance Static Standing - Balance Support: During functional activity Static Standing - Level of Assistance: 7: Independent  End of Session PT - End of Session Activity Tolerance: Patient tolerated treatment well Patient left: in bed Nurse Communication: Mobility status  GP     [facility] 01/18/2013, 12:56 PM

## 2013-01-18 NOTE — Progress Notes (Signed)
[facility] PROGRESS NOTE  [name] ZOX:[id number] DOB: 1932/11/13 DOA: 01/16/2013 PCP: [name], MD  Assessment/Plan: Active Problems:   Atrial fibrillation   Chronic anticoagulation   CKD (chronic kidney disease) stage 3, GFR 30-59 ml/min   Diabetes mellitus   Coronary artery disease   DOE (dyspnea on exertion)    1. Dyspnea/Chest tightness: Patient presented with progressive SOBOE/dereased effort tolerance and fatigue over approximately 1 month, now worse, associated with chest discomfort. Cardiac enzymes remained unelevated, and she is currently in SR. CXR is devoid of acute findings. Chest is clinically clear to auscultation. She did have a moderate anemia, so this is the likely culprit, although as her INR was sub-therapeutic at 1. 59, PE was a possibility. V/Q scan as well as LE venous dopplers were negative for VTE. Dr Charlton Haws provided cardiology consultation. and patient was subsequently seen by Dr Rollene Rotunda. Consensus is that anemia is indeed the etiology, and no further cardiac work up is indicated. Of note, last echo in 08/2012 showed EF of 45% with diastolic dysfunction. Patient is now asymptomatic, following blood transfusion.  2. Anemia: patient has a known history of chronic anemia, possibly due to chronic disease, as well as known hemorrhoids (she has recently noted small amounts of blood in stools). Anemia work up is unremarkable and FOBT is positive. Apparently, her last colonoscopy was 5 years ago by Dr Lottie Mussel. Have consulted Dr Elnoria Howard, GI, for recommendations. HB on 01/17/13, was 7.8, and bumped to 8.9 following transfusion of 1 unit PRBC. .  3. Chronic systolic/diastolic heart failure: Clinically, patient did not appear to be in overt CHF decompensation (see #1 above), although ProBNP was elevated at 1432. Continued on pre-admission dose of Lasix. ProBNP is 992 today.   4. COPD: Stable/MNo clinical exacerbation. On prn Nebs.  5. Diabetes mellitus:  This is insulin-requiring type 2. HBA1C is 8.0. Appeared uncontrolled at presentation, based on random blood glucose of 314. Now on diet, Lantus/SSI, with satisfactory control.  5. CKD stage 3: Baseline creatinine is 1.5-1.6 (in 09/2012) Appears at baseline. Following renal indices.  6. Atrial fibrillation: In SR/Rate-controlled on beta-blocker/Cardizem CD, which we have continued. Coumadin per Pharmacy.  7. Hypothyroidism: continued on thyroxine replacement therapy. TSHis pending.     Code Status: Full Code.  Family Communication:  Disposition Plan: To be determined.    Brief narrative: 79 y.o. female with prior h/o hypertension, dyslipidemia, DM with neuropathy, Hypothyroidism, chronic anemia, chronic systolic/diastolic heart failure, CAD/ischemic cardiomyopathy, s/P CABG, Mod MR/Severe TR, COPD, CVA, GERD, atrial fibrillation/Flutter, s/p PPM 08/2012, on coumadin, follows with Dr [place], came in for worsening DOE, and chest tightness for the last few weeks. No syncope, or palpitations at rest. Reports palpitations with DOE. No fever or cough. Pedal edema the same from before. No change in medications recently, no change in diet. No headache or nausea or vomiting. On arrival to ED, her ProBNP is better than the previous one. Her CXR does not show pulmonary edema or congestion. She doesn't appear to be in heart failure. Her labs revealed hemoglobin of 8.0 Drop from 10.0 She reports occasional blood in the stools. She has a h/o hemorrhoids. Admitted to medical service for further management.   Consultants:  N/A.   Procedures:  CXR.   Antibiotics:  N/A.   HPI/Subjective: Asymptomatic.   Objective: Vital signs in last 24 hours: Temp:  [97.5 F (36.4 C)-98.5 F (36.9 C)] 98.4 F (36.9 C) (07/18 0600) Pulse Rate:  [89-91] 91 (07/18 0600)  Resp:  [16] 16 (07/18 0600) BP: (124-134)/(54-61) 132/61 mmHg (07/18 0600) SpO2:  [96 %-97 %] 97 % (07/18 0600) Weight:  [94.3 kg (207 lb 14.3 oz)]  94.3 kg (207 lb 14.3 oz) (07/18 0600) Weight change: -1.8 kg (-3 lb 15.5 oz) Last BM Date: 01/17/13  Intake/Output from previous day: 07/17 0701 - 07/18 0700 In: 970 [P.O.:720; I.V.:250] Out: 1800 [Urine:1800] Total I/O In: 240 [P.O.:240] Out: 301 [Urine:300; Stool:1]   Physical Exam: General: Comfortable, alert, communicative, fully oriented, not short of breath at rest., but appears short f breath on minimal exertion.  HEENT:  Moderate clinical pallor, no jaundice, no conjunctival injection or discharge. NECK:  Supple, JVP not seen, no carotid bruits, no palpable lymphadenopathy, no palpable goiter. CHEST:  Clinically clear to auscultation, no wheezes, no crackles. HEART:  Sounds 1 and 2 heard, normal, regular, systolic murmur at base/apex. . ABDOMEN:  Obese, soft, non-tender, no palpable organomegaly, no palpable masses, normal bowel sounds. GENITALIA:  Not examined. LOWER EXTREMITIES:  Moderate pitting edema, palpable peripheral pulses. MUSCULOSKELETAL SYSTEM:  Generalized osteoarthritic changes, otherwise, normal. CENTRAL NERVOUS SYSTEM:  No focal neurologic deficit on gross examination.  Lab Results:  Recent Labs  01/17/13 0432 01/18/13 0455  WBC 7.2 6.9  HGB 7.8* 8.9*  HCT 23.5* 27.6*  PLT 194 216    Recent Labs  01/17/13 0432 01/18/13 0455  NA 139 139  K 3.3* 3.3*  CL 101 101  CO2 29 29  GLUCOSE 144* 146*  BUN 32* 33*  CREATININE 1.66* 1.78*  CALCIUM 8.6 8.9   No results found for this or any previous visit (from the past 240 hour(s)).   Studies/Results: Dg Chest 2 View  01/16/2013   *RADIOLOGY REPORT*  Clinical Data: Chest pain and weakness  CHEST - 2 VIEW  Comparison: 08/09/2012  Findings: There is a left chest wall pacer device with leads in the right atrial appendage, coronary sinus and right ventricle.  Previous median sternotomy and CABG procedure.  There is mild cardiac enlargement.  No pleural effusion or edema identified.  No airspace  consolidation.  IMPRESSION:  1.  No acute cardiopulmonary abnormalities.   Original Report Authenticated By: Signa Kell, M.D.   Nm Pulmonary Perf And Vent  01/17/2013   *RADIOLOGY REPORT*  Clinical Data:  Dyspnea on exersion , short of breath  NUCLEAR MEDICINE VENTILATION - PERFUSION LUNG SCAN  Technique:  Ventilation images were obtained in multiple projections using inhaled aerosol technetium 99 M DTPA.  Perfusion images were obtained in multiple projections after intravenous injection of Tc-19m MAA.  Radiopharmaceuticals:  43. Tc-29m DTPA aerosol and 4.8 mCi Tc- 81m MAA.  Comparison: None of  Findings:  Ventilation:   No focal ventilation defect.  Perfusion:   No wedge shaped peripheral perfusion defects to suggest acute pulmonary embolism  IMPRESSION:  No evidence of pulmonary embolism.   Original Report Authenticated By: Genevive Bi, M.D.    Medications: Scheduled Meds: . amitriptyline  75 mg Oral QHS  . aspirin EC  81 mg Oral q morning - 10a  . atorvastatin  40 mg Oral QPM  . diltiazem  240 mg Oral Daily  . donepezil  5 mg Oral QHS  . ezetimibe  10 mg Oral QPM  . furosemide  60 mg Oral q morning - 10a  . insulin aspart  0-5 Units Subcutaneous QHS  . insulin aspart  0-9 Units Subcutaneous TID WC  . insulin glargine  35 Units Subcutaneous BID  . levothyroxine  100 mcg  Oral QAC breakfast  . metoprolol succinate  50 mg Oral q morning - 10a  . pantoprazole  40 mg Oral Daily  . potassium chloride SA  10 mEq Oral q morning - 10a  . sodium chloride  3 mL Intravenous Q12H  . warfarin  12.5 mg Oral ONCE-1800  . Warfarin - Pharmacist Dosing Inpatient   Does not apply q1800   Continuous Infusions:  PRN Meds:.nitroGLYCERIN    LOS: 2 days   Tymier Lindholm,CHRISTOPHER  [facility] Pager [phone]. If 8PM-8AM, please contact night-coverage at [email or web address], password [facility] 01/18/2013, 12:00 PM  LOS: 2 days

## 2013-01-19 ENCOUNTER — Encounter (HOSPITAL_COMMUNITY): Payer: Self-pay | Admitting: *Deleted

## 2013-01-19 ENCOUNTER — Encounter (HOSPITAL_COMMUNITY)
Admission: EM | Disposition: A | Discharge status: Home / Self Care | Payer: Self-pay | Source: Home / Self Care | Attending: Emergency Medicine

## 2013-01-19 DIAGNOSIS — R0989 Other specified symptoms and signs involving the circulatory and respiratory systems: Secondary | ICD-10-CM

## 2013-01-19 DIAGNOSIS — R0609 Other forms of dyspnea: Secondary | ICD-10-CM

## 2013-01-19 DIAGNOSIS — D131 Benign neoplasm of stomach: Secondary | ICD-10-CM

## 2013-01-19 DIAGNOSIS — D132 Benign neoplasm of duodenum: Secondary | ICD-10-CM

## 2013-01-19 DIAGNOSIS — N179 Acute kidney failure, unspecified: Secondary | ICD-10-CM

## 2013-01-19 DIAGNOSIS — K5730 Diverticulosis of large intestine without perforation or abscess without bleeding: Secondary | ICD-10-CM

## 2013-01-19 DIAGNOSIS — K922 Gastrointestinal hemorrhage, unspecified: Secondary | ICD-10-CM

## 2013-01-19 DIAGNOSIS — N189 Chronic kidney disease, unspecified: Secondary | ICD-10-CM

## 2013-01-19 HISTORY — PX: COLONOSCOPY: SHX5424

## 2013-01-19 HISTORY — PX: ESOPHAGOGASTRODUODENOSCOPY: SHX5428

## 2013-01-19 LAB — BASIC METABOLIC PANEL
CO2: 29 mEq/L (ref 19–32)
Glucose, Bld: 98 mg/dL (ref 70–99)
Potassium: 3.2 mEq/L — ABNORMAL LOW (ref 3.5–5.1)
Sodium: 138 mEq/L (ref 135–145)

## 2013-01-19 LAB — GLUCOSE, CAPILLARY
Glucose-Capillary: 98 mg/dL (ref 70–99)
Glucose-Capillary: 279 mg/dL — ABNORMAL HIGH (ref 70–99)
Glucose-Capillary: 259 mg/dL — ABNORMAL HIGH (ref 70–99)

## 2013-01-19 LAB — CBC
Hemoglobin: 9.2 g/dL — ABNORMAL LOW (ref 12.0–15.0)
MCH: 29.3 pg (ref 26.0–34.0)
MCV: 90.8 fL (ref 78.0–100.0)
RBC: 3.14 MIL/uL — ABNORMAL LOW (ref 3.87–5.11)

## 2013-01-19 LAB — TSH: TSH: 1.257 u[IU]/mL (ref 0.350–4.500)

## 2013-01-19 SURGERY — COLONOSCOPY
Anesthesia: Moderate Sedation

## 2013-01-19 MED ORDER — MIDAZOLAM HCL 10 MG/2ML IJ SOLN
INTRAMUSCULAR | Status: DC | PRN
  Administered 2013-01-19: 2 mg via INTRAVENOUS
  Administered 2013-01-19: 1 mg via INTRAVENOUS
  Administered 2013-01-19: 2 mg via INTRAVENOUS

## 2013-01-19 MED ORDER — FENTANYL CITRATE 0.05 MG/ML IJ SOLN
INTRAMUSCULAR | Status: DC | PRN
  Administered 2013-01-19 (×4): 25 ug via INTRAVENOUS

## 2013-01-19 MED ORDER — POTASSIUM CHLORIDE CRYS ER 20 MEQ PO TBCR
40.0000 meq | EXTENDED_RELEASE_TABLET | Freq: Once | ORAL | Status: AC
  Administered 2013-01-19: 40 meq via ORAL
  Filled 2013-01-19: qty 2

## 2013-01-19 MED ORDER — DIPHENHYDRAMINE HCL 50 MG/ML IJ SOLN
INTRAMUSCULAR | Status: DC | PRN
  Administered 2013-01-19: 12.5 mg via INTRAVENOUS
  Administered 2013-01-19: 25 mg via INTRAVENOUS

## 2013-01-19 MED ORDER — WARFARIN SODIUM 2.5 MG PO TABS
12.5000 mg | ORAL_TABLET | Freq: Once | ORAL | Status: AC
  Administered 2013-01-19: 12.5 mg via ORAL
  Filled 2013-01-19: qty 1

## 2013-01-19 MED ORDER — PHENOL 1.4 % MT LIQD
1.0000 | OROMUCOSAL | Status: DC | PRN
  Filled 2013-01-19 (×2): qty 177

## 2013-01-19 NOTE — Interval H&P Note (Signed)
History and Physical Interval Note:  01/19/2013 9:34 AM  [name]  has presented today for surgery, with the diagnosis of Heme positive stool and recurrent anemia  The various methods of treatment have been discussed with the patient and family. After consideration of risks, benefits and other options for treatment, the patient has consented to  Procedure(s): COLONOSCOPY (N/A) ESOPHAGOGASTRODUODENOSCOPY (EGD) (N/A) as a surgical intervention .  The patient's history has been reviewed, patient examined, no change in status, stable for surgery.  I have reviewed the patient's chart and labs.  Questions were answered to the patient's satisfaction.    The recent H&P (dated *01/18/13**) was reviewed, the patient was examined and there is no change in the patients condition since that H&P was completed.   [name]  01/19/2013, 9:34 AM    [name]

## 2013-01-19 NOTE — OR Nursing (Signed)
Pt replaced her jewelry and was transported back to 1426 via WC.

## 2013-01-19 NOTE — Progress Notes (Signed)
Subjective:  She is going for colonoscopy this morning. She is improved in her breathing. She denies chest pain or severe shortness of breath at the present time.  Objective:  Vital Signs in the last 24 hours: BP 142/70  Pulse 91  Temp(Src) 97.7 F (36.5 C) (Oral)  Resp 16  Ht 5\' 3"  (1.6 m)  Wt 94.394 kg (208 lb 1.6 oz)  BMI 36.87 kg/m2  SpO2 100%  Physical Exam: Pleasant [name] female in no acute distress Lungs:  Clear Cardiac:  Regular rhythm, normal S1 and S2, no S3, neck veins elevated with V waves of tricuspid regurgitation present, 1-2/6 murmur Abdomen:  Soft, nontender, no masses Extremities:  1-2+ edema   Intake/Output from previous day: 07/18 0701 - 07/19 0700 In: 1080 [P.O.:1080] Out: 2251 [Urine:2250; Stool:1] Weight Filed Weights   01/16/13 2228 01/18/13 0600 01/19/13 0453  Weight: 96.1 kg (211 lb 13.8 oz) 94.3 kg (207 lb 14.3 oz) 94.394 kg (208 lb 1.6 oz)    Lab Results: Basic Metabolic Panel:  Recent Labs  16/10/96 0432 01/18/13 0455  NA 139 139  K 3.3* 3.3*  CL 101 101  CO2 29 29  GLUCOSE 144* 146*  BUN 32* 33*  CREATININE 1.66* 1.78*    CBC:  Recent Labs  01/16/13 1715  01/18/13 0455 01/19/13 0426  WBC 7.0  < > 6.9 8.0  NEUTROABS 4.3  --   --   --   HGB 8.0*  < > 8.9* 9.2*  HCT 25.0*  < > 27.6* 28.5*  MCV 91.6  < > 90.2 90.8  PLT 225  < > 216 249  < > = values in this interval not displayed.  BNP    Component Value Date/Time   PROBNP 992.6* 01/18/2013 0455    PROTIME: Lab Results  Component Value Date   INR 1.54* 01/19/2013   INR 1.47 01/18/2013   INR 1.59* 01/16/2013    Telemetry: Atrial fibrillation  Assessment/Plan:  1. Dyspnea likely due to anemia which is better 2. History of atrial fibrillation 3. Coronary artery disease 4. Combined systolic and diastolic heart failure with moderate to severe tricuspid regurgitation   recommendations:  No further cardiac workup is necessary at this time. She does have tricuspid  regurgitation and will need to continue her current medications and try to correct the anemia. Await colonoscopy results.   Darden Palmer  MD [facility]  01/19/2013, 8:41 AM

## 2013-01-19 NOTE — Op Note (Signed)
[facility] [address] [place], [address]   ENDOSCOPY PROCEDURE REPORT  PATIENT: Natasha, Alexander  MR#: [id number] BIRTHDATE: 12-Dec-1932 , 80  yrs. old GENDER: Female ENDOSCOPIST: Louis Meckel, MD REFERRED BY:  Creola Corn, M.D. PROCEDURE DATE:  01/19/2013 PROCEDURE:  EGD w/ biopsy ASA CLASS:     Class II INDICATIONS:  Anemia.  heme-positive stool MEDICATIONS: There was residual sedation effect present from prior procedure, These medications were titrated to patient response per physician's verbal order, Fentanyl 25 mcg IV, and Benadryl 12.5 mg IV TOPICAL ANESTHETIC:  DESCRIPTION OF PROCEDURE: After the risks benefits and alternatives of the procedure were thoroughly explained, informed consent was obtained.  The Pentax Gastroscope [id number] endoscope was introduced through the mouth and advanced to the third portion of the duodenum. Without limitations.  The instrument was slowly withdrawn as the mucosa was fully examined.      At the apex of the duodenum there is a 2 mm sessile polyp.  Despite multiple attempts I was unable to biopsy the polyp because of difficulty with positioning of the scope. In the gastric cardia there was a friable 3 mm polyp.  Biopsies were taken.  There was a minimal amount of blood at the tip of the polyp. The remainder of the upper endoscopy exam was otherwise normal. Retroflexed views revealed no abnormalities.     The scope was then withdrawn from the patient and the procedure completed.  COMPLICATIONS: There were no complications.  ENDOSCOPIC IMPRESSION: 1.  friable gastric polyp 2.  nonbleeding duodenal polyp  The gastric polyp could be a source for Hemoccult-positive stool. It is doubtful that it is causing a significant anemia.  RECOMMENDATIONS: Await biopsy results REPEAT EXAM:  eSigned:  Louis Meckel, MD 01/19/2013 10:25 AM   CC:  PATIENT NAME:  Natasha, Alexander MR#: [id number]

## 2013-01-19 NOTE — Progress Notes (Addendum)
[facility] PROGRESS NOTE  MAISLEY HAINSWORTH ZOX:[id number] DOB: [birth year] DOA: 01/16/2013 PCP: [name], MD  Assessment/Plan: Active Problems:   Atrial fibrillation   Chronic anticoagulation   CKD (chronic kidney disease) stage 3, GFR 30-59 ml/min   Diabetes mellitus   Coronary artery disease   DOE (dyspnea on exertion)   Benign neoplasm of stomach   Adenomatous duodenal polyp   Diverticulosis of colon (without mention of hemorrhage)    1. Dyspnea/Chest tightness: Patient presented with progressive SOBOE/dereased effort tolerance and fatigue over approximately 1 month, now worse, associated with chest discomfort. Cardiac enzymes remained unelevated, and she is currently in SR. CXR is devoid of acute findings. Chest is clinically clear to auscultation. She did have a moderate anemia, so this is the likely culprit, although as her INR was sub-therapeutic at 1. 59, PE was a possibility. V/Q scan as well as LE venous dopplers were negative for VTE. Dr Charlton Haws provided cardiology consultation. and patient was subsequently seen by Dr Rollene Rotunda. Consensus is that anemia is indeed the etiology, and no further cardiac work up is indicated. Of note, last echo in 08/2012 showed EF of 45% with diastolic dysfunction. Patient is now asymptomatic, following blood transfusion.  2. Anemia/Gastroduodenal Polyps: Patient has a known history of chronic anemia, possibly due to chronic disease, as well as known hemorrhoids (she has recently noted small amounts of blood in stools). Anemia work up was unremarkable and FOBT was positive. Apparently, her last colonoscopy was 5 years ago by Dr Lottie Mussel. HB on 01/17/13, was 7.8, bumped to 8.9 following transfusion of 1 unit PRBC, and has remained stable/reasonable, since. Dr Jeani Hawking provided initial GI consultation, and Dr [name] performed EGD/Colonoscopy on 01/19/13, which revealed diverticulosis, but otherwise normal colon, as well as a  slightly friable gastric polyp with blood at its tip and a nonbleeding duodenal polyp. Biopsy of gastric lesion was done, and pathology is pending.  3. Chronic systolic/diastolic heart failure: Clinically, patient did not appear to be in overt CHF decompensation (see #1 above), although ProBNP was elevated at 1432. Continued on pre-admission dose of Lasix. ProBNP is 992 today.   4. COPD: Stable/No clinical exacerbation. On prn Nebs.  5. Diabetes mellitus: This is insulin-requiring type 2. HBA1C is 8.0. Appeared uncontrolled at presentation, based on random blood glucose of 314. Now on diet, Lantus/SSI, with satisfactory control.  5. CKD stage 3: Baseline creatinine is 1.5-1.6 (in 09/2012) Appears at baseline. Following renal indices.  6. Atrial fibrillation: In SR/Rate-controlled on beta-blocker/Cardizem CD, which we have continued. Coumadin per Pharmacy. Temporarily held for endoscopy. GI has okayed recommencement today.  7. Hypothyroidism: continued on thyroxine replacement therapy.     Code Status: Full Code.  Family Communication:  Disposition Plan: To be determined.    Brief narrative: 79 y.o. female with prior h/o hypertension, dyslipidemia, DM with neuropathy, Hypothyroidism, chronic anemia, chronic systolic/diastolic heart failure, CAD/ischemic cardiomyopathy, s/P CABG, Mod MR/Severe TR, COPD, CVA, GERD, atrial fibrillation/Flutter, s/p PPM 08/2012, on coumadin, follows with Dr [place], came in for worsening DOE, and chest tightness for the last few weeks. No syncope, or palpitations at rest. Reports palpitations with DOE. No fever or cough. Pedal edema the same from before. No change in medications recently, no change in diet. No headache or nausea or vomiting. On arrival to ED, her ProBNP is better than the previous one. Her CXR does not show pulmonary edema or congestion. She doesn't appear to be in heart failure. Her labs revealed hemoglobin  of 8.0 Drop from 10.0 She reports occasional blood  in the stools. She has a h/o hemorrhoids. Admitted to medical service for further management.   Consultants:  N/A.   Procedures:  CXR.   Antibiotics:  N/A.   HPI/Subjective: No new issues.   Objective: Vital signs in last 24 hours: Temp:  [97.2 F (36.2 C)-99.2 F (37.3 C)] 99.2 F (37.3 C) (07/19 1101) Pulse Rate:  [89-94] 89 (07/19 1417) Resp:  [8-90] 16 (07/19 1417) BP: (111-144)/(30-86) 111/30 mmHg (07/19 1417) SpO2:  [94 %-100 %] 98 % (07/19 1417) Weight:  [94.394 kg (208 lb 1.6 oz)] 94.394 kg (208 lb 1.6 oz) (07/19 0453) Weight change: 0.094 kg (3.3 oz) Last BM Date: 01/19/13  Intake/Output from previous day: 07/18 0701 - 07/19 0700 In: 1080 [P.O.:1080] Out: 2251 [Urine:2250; Stool:1] Total I/O In: 120 [P.O.:120] Out: -    Physical Exam: General: Comfortable, alert, communicative, fully oriented, not short of breath at rest., but appears short f breath on minimal exertion.  HEENT:  Moderate clinical pallor, no jaundice, no conjunctival injection or discharge. NECK:  Supple, JVP not seen, no carotid bruits, no palpable lymphadenopathy, no palpable goiter. CHEST:  Clinically clear to auscultation, no wheezes, no crackles. HEART:  Sounds 1 and 2 heard, normal, regular, systolic murmur at base/apex. . ABDOMEN:  Obese, soft, non-tender, no palpable organomegaly, no palpable masses, normal bowel sounds. GENITALIA:  Not examined. LOWER EXTREMITIES:  Moderate pitting edema, palpable peripheral pulses. MUSCULOSKELETAL SYSTEM:  Generalized osteoarthritic changes, otherwise, normal. CENTRAL NERVOUS SYSTEM:  No focal neurologic deficit on gross examination.  Lab Results:  Recent Labs  01/18/13 0455 01/19/13 0426  WBC 6.9 8.0  HGB 8.9* 9.2*  HCT 27.6* 28.5*  PLT 216 249    Recent Labs  01/18/13 0455 01/19/13 0426  NA 139 138  K 3.3* 3.2*  CL 101 99  CO2 29 29  GLUCOSE 146* 98  BUN 33* 29*  CREATININE 1.78* 1.59*  CALCIUM 8.9 8.9   No results  found for this or any previous visit (from the past 240 hour(s)).   Studies/Results: No results found.  Medications: Scheduled Meds: . amitriptyline  75 mg Oral QHS  . aspirin EC  81 mg Oral q morning - 10a  . atorvastatin  40 mg Oral QPM  . diltiazem  240 mg Oral Daily  . donepezil  5 mg Oral QHS  . ezetimibe  10 mg Oral QPM  . furosemide  60 mg Oral q morning - 10a  . insulin aspart  0-5 Units Subcutaneous QHS  . insulin aspart  0-9 Units Subcutaneous TID WC  . insulin glargine  35 Units Subcutaneous BID  . levothyroxine  100 mcg Oral QAC breakfast  . metoprolol succinate  50 mg Oral q morning - 10a  . pantoprazole  40 mg Oral Daily  . potassium chloride SA  10 mEq Oral q morning - 10a  . potassium chloride  40 mEq Oral Once  . sodium chloride  3 mL Intravenous Q12H  . Warfarin - Pharmacist Dosing Inpatient   Does not apply q1800   Continuous Infusions:  PRN Meds:.nitroGLYCERIN    LOS: 3 days   Holy B[place]field,CHRISTOPHER  [facility] Pager [phone]. If 8PM-8AM, please contact night-coverage at [email or web address], password [facility] 01/19/2013, 3:17 PM  LOS: 3 days

## 2013-01-19 NOTE — H&P (View-Only) (Signed)
UNASSIGNED PATIENT  Reason for Consult: Anemia and Hematochezia Referring Physician: [facility]  [name] HPI: This is a 79 year female with a history of GI bleed admitted for complaints of SOB.  She has a history of CHF and atrial fibrillation, however, Cardiology does not feel that her SOB was secondary to a cardiac issue.  She was negative for any evidence of PE.  Over the course of this hospitalization she was noted to have a mild drop in her HGB.  Her baseline tends to be in the 10 range, but it has fluctuated in the past from the 7-11 range.  Recently she reports some hematochezia.  She last evaluated by myself in 08/2011 for complaints of hematochezia when she was admitted as an unassigned patient.  Her INR at that time was 2.1.  She was identified to have diverticula and it was felt that she had a diverticular bleeding.  Prior to that time Dr. Kinnie Scales worked her up for anemia, but the results are not available.  Past Medical History  Diagnosis Date  . Coronary artery disease     a. Severe two vessel; s/p CABG (LIMA-LAD, SVG-diag, SVG-PDA);  b. 05/2012 NSTEMI in setting of rapid aflutter;  c. 06/2012 Lexiscan cardiolite: small, mild reversible defect in lateral wall->mild ischemia, low-risk->med Rx.  Marland Kitchen Hypertension   . Type II diabetes mellitus   . Hyperlipidemia   . Diabetic neuropathy   . CVA (cerebral vascular accident) 2004  . Hypothyroidism   . Chronic anemia   . GERD (gastroesophageal reflux disease)   . Arthritis   . Memory loss, short term   . COPD (chronic obstructive pulmonary disease)   . Blood transfusion without reported diagnosis   . Chronic combined systolic and diastolic CHF (congestive heart failure)     a. 07/2012 Echo: EF 30%, Gr II DD, Mild AS/MR, Mod-Sev TR, mild bi-atrial and RV dil, PASP .  . Moderate mitral regurgitation     a. mod by TEE 2012, mild by echo 2013, 07/2012  . Severe tricuspid regurgitation     a. Severe by TEE 2012, mod by  echo 2013, mod-sev by echo 07/2012  . Chronic vulvitis 07/1993  . Idiopathic anemia 2006  . Atrial fibrillation     a. Failed amiodarone (prolonged QT), not a candidate for class IC meds due to her CAD. No Multaq due to CHF hx;  b. 07/2012 s/p SJM Anthem RF Bi-V PPM, ser # [id number] and AV node RFCA;  c. chronic coumadin.  . Atrial flutter   . Pacemaker   . GI bleed     a. 10/2011 - Dr Elnoria Howard did scope which showed hiatal hernia, colon revealed diverticula (diverticular bleed), internal/ext hemorrhoids.    Past Surgical History  Procedure Laterality Date  . Coronary artery bypass graft  06/2009    X3, LIMA to LAD, SVG to diagonal, SVG to the posterior descending artery.  . Tonsillectomy    . Knee arthroscopy  08/2001  . Transthoracic echocardiogram  11/12/2010     Ejection fraction felt to be around 50%.  Wall thickness was increased in a pattern of mild LVH.  Moderately dilated left atrium.  Mildly dilated right atrium. Right ventricle was mildly dilated with mildly reduced systolic function  . Cardiac catheterization  06/09/2009    inferior wall hypokinesia with ejection fraction of 55%.  . Abdominal hysterectomy    . Joint replacement    . Tee without cardioversion  05/23/2011    Procedure: TRANSESOPHAGEAL  ECHOCARDIOGRAM (TEE);  Surgeon: Lewayne Bunting, MD;  Location: [facility] ENDOSCOPY;  Service: Cardiovascular;  Laterality: N/A;  . Incision and drainage abscess  05/07/2012    Procedure: INCISION AND DRAINAGE ABSCESS;  Surgeon: Ardeth Sportsman, MD;  Location: [facility] OR;  Service: General;  Laterality: N/A;  Groin wound  . Total abdominal hysterectomy  1979  . Breast biopsy  Left  . Bilateral salpingectomy  10/2000  . Breast biopsy  09/18/2009    fibrocystic change  . Coronary artery bypass graft  06/2009  . Pacemaker insertion  08/2012    Family History  Problem Relation Age of Onset  . Colon cancer Mother   . Stroke Mother   . Hypertension Mother   . Cancer Mother     breast  . Cancer Father      colon  . Heart attack Father     x2  . Renal Disease Sister     dialysis-twin sister  . Renal Disease Sister     renal bypass    Social History:  reports that she has quit smoking. Her smoking use included Cigarettes. She smoked 0.00 packs per day. She has never used smokeless tobacco. She reports that she does not drink alcohol or use illicit drugs.  Allergies:  Allergies  Allergen Reactions  . Betadine (Povidone Iodine) Itching  . Capoten (Captopril) Other (See Comments)    unknown  . Codone (Hydrocodone) Nausea And Vomiting  . Methadone Nausea And Vomiting  . Penicillins Swelling  . Propoxyphene And Methadone     Intolerance to darvocet    Medications:  Scheduled: . amitriptyline  75 mg Oral QHS  . aspirin EC  81 mg Oral q morning - 10a  . atorvastatin  40 mg Oral QPM  . diltiazem  240 mg Oral Daily  . donepezil  5 mg Oral QHS  . ezetimibe  10 mg Oral QPM  . furosemide  60 mg Oral q morning - 10a  . insulin aspart  0-5 Units Subcutaneous QHS  . insulin aspart  0-9 Units Subcutaneous TID WC  . insulin glargine  35 Units Subcutaneous BID  . levothyroxine  100 mcg Oral QAC breakfast  . metoprolol succinate  50 mg Oral q morning - 10a  . pantoprazole  40 mg Oral Daily  . potassium chloride SA  10 mEq Oral q morning - 10a  . potassium chloride  10 mEq Oral Once  . sodium chloride  3 mL Intravenous Q12H  . warfarin  12.5 mg Oral ONCE-1800  . Warfarin - Pharmacist Dosing Inpatient   Does not apply q1800   Continuous:   Results for orders placed during the hospital encounter of 01/16/13 (from the past 24 hour(s))  GLUCOSE, CAPILLARY     Status: Abnormal   Collection Time    01/17/13  5:11 PM      Result Value Range   Glucose-Capillary 279 (*) 70 - 99 mg/dL  GLUCOSE, CAPILLARY     Status: Abnormal   Collection Time    01/17/13  9:48 PM      Result Value Range   Glucose-Capillary 266 (*) 70 - 99 mg/dL  CBC     Status: Abnormal   Collection Time    01/18/13   4:55 AM      Result Value Range   WBC 6.9  4.0 - 10.5 K/uL   RBC 3.06 (*) 3.87 - 5.11 MIL/uL   Hemoglobin 8.9 (*) 12.0 - 15.0 g/dL   HCT 16.1 (*)  36.0 - 46.0 %   MCV 90.2  78.0 - 100.0 fL   MCH 29.1  26.0 - 34.0 pg   MCHC 32.2  30.0 - 36.0 g/dL   RDW 04.5 (*) 40.9 - 81.1 %   Platelets 216  150 - 400 K/uL  BASIC METABOLIC PANEL     Status: Abnormal   Collection Time    01/18/13  4:55 AM      Result Value Range   Sodium 139  135 - 145 mEq/L   Potassium 3.3 (*) 3.5 - 5.1 mEq/L   Chloride 101  96 - 112 mEq/L   CO2 29  19 - 32 mEq/L   Glucose, Bld 146 (*) 70 - 99 mg/dL   BUN 33 (*) 6 - 23 mg/dL   Creatinine, Ser 9.14 (*) 0.50 - 1.10 mg/dL   Calcium 8.9  8.4 - 78.2 mg/dL   GFR calc non Af Amer 26 (*) >90 mL/min   GFR calc Af Amer 30 (*) >90 mL/min  PRO B NATRIURETIC PEPTIDE     Status: Abnormal   Collection Time    01/18/13  4:55 AM      Result Value Range   Pro B Natriuretic peptide (BNP) 992.6 (*) 0 - 450 pg/mL  PROTIME-INR     Status: Abnormal   Collection Time    01/18/13  4:55 AM      Result Value Range   Prothrombin Time 17.4 (*) 11.6 - 15.2 seconds   INR 1.47  0.00 - 1.49  GLUCOSE, CAPILLARY     Status: Abnormal   Collection Time    01/18/13  7:47 AM      Result Value Range   Glucose-Capillary 116 (*) 70 - 99 mg/dL   Comment 1 Notify RN    OCCULT BLOOD X 1 CARD TO LAB, STOOL     Status: Abnormal   Collection Time    01/18/13  9:45 AM      Result Value Range   Fecal Occult Bld POSITIVE (*) NEGATIVE  GLUCOSE, CAPILLARY     Status: Abnormal   Collection Time    01/18/13 11:57 AM      Result Value Range   Glucose-Capillary 161 (*) 70 - 99 mg/dL   Comment 1 Notify RN       Dg Chest 2 View  01/16/2013   *RADIOLOGY REPORT*  Clinical Data: Chest pain and weakness  CHEST - 2 VIEW  Comparison: 08/09/2012  Findings: There is a left chest wall pacer device with leads in the right atrial appendage, coronary sinus and right ventricle.  Previous median sternotomy and CABG  procedure.  There is mild cardiac enlargement.  No pleural effusion or edema identified.  No airspace consolidation.  IMPRESSION:  1.  No acute cardiopulmonary abnormalities.   Original Report Authenticated By: Signa Kell, M.D.   Nm Pulmonary Perf And Vent  01/17/2013   *RADIOLOGY REPORT*  Clinical Data:  Dyspnea on exersion , short of breath  NUCLEAR MEDICINE VENTILATION - PERFUSION LUNG SCAN  Technique:  Ventilation images were obtained in multiple projections using inhaled aerosol technetium 99 M DTPA.  Perfusion images were obtained in multiple projections after intravenous injection of Tc-24m MAA.  Radiopharmaceuticals:  43. Tc-39m DTPA aerosol and 4.8 mCi Tc- 89m MAA.  Comparison: None of  Findings:  Ventilation:   No focal ventilation defect.  Perfusion:   No wedge shaped peripheral perfusion defects to suggest acute pulmonary embolism  IMPRESSION:  No evidence of pulmonary embolism.  Original Report Authenticated By: Genevive Bi, M.D.    ROS:  As stated above in the HPI otherwise negative.  Blood pressure 132/61, pulse 91, temperature 98.4 F (36.9 C), temperature source Oral, resp. rate 16, height 5\' 3"  (1.6 m), weight 207 lb 14.3 oz (94.3 kg), SpO2 97.00%.    PE: Gen: NAD, Alert and Oriented HEENT:  [place]/AT, EOMI Neck: Supple, no LAD Lungs: CTA Bilaterally CV: RRR without M/G/R ABM: Soft, NTND, +BS Ext: No C/C/E  Assessment/Plan: 1) Anemia. 2) Hematochezia - Minimal.   No evidence of any significant bleeding at this time.  She feels much better after the blood transfusion.  I am not certain about the source of her anemia. Her hemoccult is pending at this time.    Plan: 1) Follow HGB. 2) Transfuse as necessary. 3) Supportive care at this time.  If the anemia should worsen, a repeat endoscopic examination may be warranted. 4) Await hemoccult.   Crescencio Jozwiak D 01/18/2013, 1:34 PM   The patient is heme positive.  With the findings, I think it will be prudent to  reevaluate her with an EGD/Colonoscopy.

## 2013-01-19 NOTE — Discharge Summary (Signed)
Physician Discharge Summary  [name]:[id number] DOB: [birth year] DOA: 01/16/2013  PCP: [name], MD  Admit date: 01/16/2013 Discharge date: 01/20/2013  Time spent: 40 minutes  Recommendations for Outpatient Follow-up:  1. Follow up with primary MD.  2. Follow up with primary Cardiologist.  3. Follow up with Dr [name], gastroenterologist.  Discharge Diagnoses:  Active Problems:   Atrial fibrillation   Chronic anticoagulation   CKD (chronic kidney disease) stage 3, GFR 30-59 ml/min   Diabetes mellitus   Coronary artery disease   DOE (dyspnea on exertion)   Benign neoplasm of stomach   Adenomatous duodenal polyp   Diverticulosis of colon (without mention of hemorrhage)   Discharge Condition: Satisfactory.   Diet recommendation: Heart-Healthy/Carbohydrate-modified.   Filed Weights   01/18/13 0600 01/19/13 0453 01/20/13 0410  Weight: 94.3 kg (207 lb 14.3 oz) 94.394 kg (208 lb 1.6 oz) 93.9 kg (207 lb 0.2 oz)    History of present illness:  79 y.o. female with prior h/o hypertension, dyslipidemia, DM with neuropathy, Hypothyroidism, chronic anemia, chronic systolic/diastolic heart failure, CAD/ischemic cardiomyopathy, s/P CABG, Mod MR/Severe TR, COPD, CVA, GERD, atrial fibrillation/Flutter, s/p PPM 08/2012, on coumadin, follows with Dr Sw[place], came in for worsening DOE, and chest tightness for the last few weeks. No syncope, or palpitations at rest. Reports palpitations with DOE. No fever or cough. Pedal edema the same from before. No change in medications recently, no change in diet. No headache or nausea or vomiting. On arrival to ED, her ProBNP is better than the previous one. Her CXR does not show pulmonary edema or congestion. She doesn't appear to be in heart failure. Her labs revealed hemoglobin of 8.0 Drop from 10.0 She reports occasional blood in the stools. She has a h/o hemorrhoids. Admitted to medical service for further management.   Hospital Course:   1. Dyspnea/Chest tightness: Patient presented with progressive SOBOE/decreased effort tolerance and fatigue over approximately 1 month, now worse, associated with chest discomfort. Cardiac enzymes remained unelevated, she was in SR, CXR was devoid of acute findings and chest was clinically clear to auscultation. As her INR was sub-therapeutic at 1. 59, PE was considered a possibility, but V/Q scan as well as LE venous dopplers were negative for VTE. She did have a moderate anemia, so this is the likely culprit. Dr Charlton Haws provided cardiology consultation and patient was subsequently seen by Dr Rollene Rotunda. Consensus is that anemia is indeed the etiology, and no further cardiac work up is indicated. Of note, last echo in 08/2012 showed EF of 45% with diastolic dysfunction. Patient is now asymptomatic, following blood transfusion.  2. Anemia/Gastroduodenal Polyps: Patient has a known history of chronic anemia, possibly due to chronic disease, as well as prior history of  hemorrhoids (she has recently noted small amounts of blood in stools). Anemia work up was unremarkable and FOBT was positive. Apparently, her last colonoscopy was 5 years ago by Dr Lottie Mussel. HB on 01/17/13, was 7.8, bumped to 8.9 following transfusion of 1 unit PRBC, and remained stable/reasonable, since. Dr Jeani Hawking provided initial GI consultation, and Dr [name] performed EGD/Colonoscopy on 01/19/13, which revealed diverticulosis, but otherwise normal colon, as well as a slightly friable gastric polyp with blood at its tip and a nonbleeding duodenal polyp. Biopsy of gastric lesion was done, and pathology is pending. Per Dr Arlyce Dice, if pathology reveals an adenomatous histology, endoscopic removal will be indicated.  3. Chronic systolic/diastolic heart failure: Clinically, patient did not appear to be in  overt CHF decompensation (see #1 above), although ProBNP was elevated at 1432. Continued on pre-admission dose of Lasix, and as of  01/18/13, ProBNP was 992.  4. COPD: Stable/No clinical exacerbation. On prn Nebs.  5. Diabetes mellitus: This is insulin-requiring type 2. HBA1C is 8.0. Appeared uncontrolled at presentation, based on random blood glucose of 314. Managed with diet, Lantus/SSI during hospitalization, with satisfactory control.  5. CKD stage 3: Baseline creatinine is 1.5-1.6 (in 09/2012). Appears at baseline.  6. Atrial fibrillation: In SR/Rate-controlled on beta-blocker/Cardizem CD, which we continued. Coumadin per Pharmacy. Temporarily held for endoscopy. GI okayed recommencement following endoscopy on 01/19/13.  7. Hypothyroidism: Continued on pre-admission thyroxine replacement therapy. TSH was 1.257.    Procedures:  See Below.   EGD/Colonoscopy on 01/19/13.   Consultations:  Dr Jeani Hawking, GI.  Dr [name], GI.   Discharge Exam: Filed Vitals:   01/19/13 1152 01/19/13 1417 01/19/13 2120 01/20/13 0410  BP:  111/30 125/62 137/58  Pulse: 92 89 91 91  Temp:   98 F (36.7 C) 98.3 F (36.8 C)  TempSrc:  Oral Oral Oral  Resp:  16 16 18   Height:      Weight:    93.9 kg (207 lb 0.2 oz)  SpO2:  98% 99% 100%    General: Comfortable, alert, communicative, fully oriented, not short of breath at rest., but appears short f breath on minimal exertion.  HEENT: Moderate clinical pallor, no jaundice, no conjunctival injection or discharge.  NECK: Supple, JVP not seen, no carotid bruits, no palpable lymphadenopathy, no palpable goiter.  CHEST: Clinically clear to auscultation, no wheezes, no crackles.  HEART: Sounds 1 and 2 heard, normal, regular, systolic murmur at base/apex. .  ABDOMEN: Obese, soft, non-tender, no palpable organomegaly, no palpable masses, normal bowel sounds.  GENITALIA: Not examined.  LOWER EXTREMITIES: Moderate pitting edema, palpable peripheral pulses.  MUSCULOSKELETAL SYSTEM: Generalized osteoarthritic changes, otherwise, normal.  CENTRAL NERVOUS SYSTEM: No focal neurologic  deficit on gross examination.  Discharge Instructions      Discharge Orders   Future Appointments Provider Department Dept Phone   01/28/2013 10:30 AM Krista Blue [facility] [phone]   01/28/2013 11:00 AM [facility] Inj Nurse [place] [facility] [phone]   01/29/2013 1:30 PM [facility] [phone]   01/31/2013 1:30 PM Peter M Sw[place], MD [facility] Main Office [place]) [phone]   02/01/2013 12:30 PM Marinus Maw, MD [facility] [place]) [phone]   02/25/2013 10:30 AM Krista Blue [facility] ON[facility] [phone]   02/25/2013 11:00 AM [facility] In[facility] Nurse [place] [facility] [phone]   03/29/2013 9:00 AM Dava Najjar [facility] [phone]   03/29/2013 10:00 AM [facility] Covering Provider [address] [place] [facility] [phone]   12/19/2013 12:45 PM Annamaria Boots, MD [facility] [phone]   Future Orders Complete By Expires     Diet - low sodium heart healthy  As directed     Diet Carb Modified  As directed     Increase activity slowly  As directed         Medication List         amitriptyline 25 MG tablet  Commonly known as:  ELAVIL  Take 75 mg by mouth at bedtime.     aspirin EC 81 MG tablet  Take 81 mg by mouth every morning.  atorvastatin 40 MG tablet  Commonly known as:  LIPITOR  Take 40 mg by mouth every evening.     diltiazem 240 MG 24 hr capsule  Commonly known as:  CARDIZEM CD  Take 1 capsule (240 mg total) by mouth daily.     donepezil 5 MG tablet  Commonly known as:  ARICEPT  Take 5 mg by mouth at bedtime.     esomeprazole 40 MG capsule  Commonly known as:  NEXIUM  Take 40 mg by mouth daily before breakfast.     EYE VITAMINS PO  Take 1 tablet by mouth 2 (two) times daily.      ezetimibe 10 MG tablet  Commonly known as:  ZETIA  Take 10 mg by mouth every evening.     furosemide 40 MG tablet  Commonly known as:  LASIX  Take 60 mg by mouth every morning.     insulin glargine 100 UNIT/ML injection  Commonly known as:  LANTUS  Inject 35 Units into the skin 2 (two) times daily.     insulin lispro 100 UNIT/ML injection  Commonly known as:  HUMALOG  - Inject 0-20 Units into the skin 2 (two) times daily as needed for high blood sugar. Home sliding scale  - If blood sugar is <200 take 15 units  - If blood sugar is >200 take 20 units  - If blood sugar is less than 70, don't take any     levothyroxine 100 MCG tablet  Commonly known as:  SYNTHROID, LEVOTHROID  Take 100 mcg by mouth every morning.     metoprolol succinate 50 MG 24 hr tablet  Commonly known as:  TOPROL-XL  Take 50 mg by mouth every morning. Take with or immediately following a meal.     nitroGLYCERIN 0.4 MG SL tablet  Commonly known as:  NITROSTAT  Place 0.4 mg under the tongue every 5 (five) minutes as needed. For chest pain     potassium chloride SA 20 MEQ tablet  Commonly known as:  K-DUR,KLOR-CON  Take 10 mEq by mouth every morning.     warfarin 5 MG tablet  Commonly known as:  COUMADIN  Take 7.5-10 mg by mouth every evening. Wednesday and Saturday take 2 tablets. Sunday, Monday, Tuesday, Thursday and Friday take 1.5 tabs.       Allergies  Allergen Reactions  . Betadine (Povidone Iodine) Itching  . Capoten (Captopril) Other (See Comments)    unknown  . Codone (Hydrocodone) Nausea And Vomiting  . Methadone Nausea And Vomiting  . Penicillins Swelling  . Propoxyphene And Methadone     Intolerance to darvocet   Follow-up Information   Schedule an appointment as soon as possible for a visit with [name], MD.   Contact information:   [address] [facility] [place] [address] [phone]       Schedule an appointment as soon as possible  for a visit with Peter Swazil[place], MD.   Contact information:   [address] [place] [address] [phone]       Call [name], MD.   Contact information:   [address] [place] [address] [phone]        The results of significant diagnostics from this hospitalization (including imaging, microbiology, ancillary and laboratory) are listed below for reference.    Significant Diagnostic Studies: Dg Chest 2 View  01/16/2013   *RADIOLOGY REPORT*  Clinical Data: Chest pain and weakness  CHEST - 2  VIEW  Comparison: 08/09/2012  Findings: There is a left chest wall pacer device with leads in the right atrial appendage, coronary sinus and right ventricle.  Previous median sternotomy and CABG procedure.  There is mild cardiac enlargement.  No pleural effusion or edema identified.  No airspace consolidation.  IMPRESSION:  1.  No acute cardiopulmonary abnormalities.   Original Report Authenticated By: Signa Kell, M.D.   Nm Pulmonary Perf And Vent  01/17/2013   *RADIOLOGY REPORT*  Clinical Data:  Dyspnea on exersion , short of breath  NUCLEAR MEDICINE VENTILATION - PERFUSION LUNG SCAN  Technique:  Ventilation images were obtained in multiple projections using inhaled aerosol technetium 99 M DTPA.  Perfusion images were obtained in multiple projections after intravenous injection of Tc-35m MAA.  Radiopharmaceuticals:  43. Tc-5m DTPA aerosol and 4.8 mCi Tc- 35m MAA.  Comparison: None of  Findings:  Ventilation:   No focal ventilation defect.  Perfusion:   No wedge shaped peripheral perfusion defects to suggest acute pulmonary embolism  IMPRESSION:  No evidence of pulmonary embolism.   Original Report Authenticated By: Genevive Bi, M.D.    Microbiology: No results found for this or any previous visit (from the past 240 hour(s)).   Labs: Basic Metabolic Panel:  Recent Labs Lab 01/16/13 1715 01/17/13 0432 01/18/13 0455 01/19/13 0426 01/20/13 0443  NA  135 139 139 138 137  K 3.8 3.3* 3.3* 3.2* 3.5  CL 98 101 101 99 100  CO2 27 29 29 29 26   GLUCOSE 293* 144* 146* 98 124*  BUN 31* 32* 33* 29* 24*  CREATININE 1.57* 1.66* 1.78* 1.59* 1.63*  CALCIUM 8.6 8.6 8.9 8.9 8.9   Liver Function Tests: No results found for this basename: AST, ALT, ALKPHOS, BILITOT, PROT, ALBUMIN,  in the last 168 hours No results found for this basename: LIPASE, AMYLASE,  in the last 168 hours No results found for this basename: AMMONIA,  in the last 168 hours CBC:  Recent Labs Lab 01/16/13 1715 01/17/13 0432 01/18/13 0455 01/19/13 0426 01/20/13 0443  WBC 7.0 7.2 6.9 8.0 6.7  NEUTROABS 4.3  --   --   --   --   HGB 8.0* 7.8* 8.9* 9.2* 8.8*  HCT 25.0* 23.5* 27.6* 28.5* 27.7*  MCV 91.6 90.7 90.2 90.8 91.1  PLT 225 194 216 249 236   Cardiac Enzymes:  Recent Labs Lab 01/16/13 1715 01/16/13 2320 01/17/13 0432 01/17/13 1025  TROPONINI <0.30 <0.30 <0.30 <0.30   BNP: BNP (last 3 results)  Recent Labs  01/16/13 1742 01/18/13 0455 01/19/13 0426  PROBNP 1432.0* 992.6* 811.7*   CBG:  Recent Labs Lab 01/19/13 0729 01/19/13 1139 01/19/13 1614 01/19/13 2058 01/20/13 0737  GLUCAP 98 100* 279* 259* 99       Signed:  Maleiah Dula,CHRISTOPHER  [facility] 01/20/2013, 10:53 AM

## 2013-01-19 NOTE — Progress Notes (Signed)
Colonoscopy demonstrated diffuse diverticulosis. There was no fresh or old blood. At endoscopy there was a slightly friable gastric polyp with blood at its tip. She also had a nonbleeding duodenal polyp.  The gastric polyp could be a source for Hemoccult-positive stool. I think it is unlikely etiology for her anemia.  Recommendations #1 check biopsy results; if adenomatous she should have the polyp removed endoscopically

## 2013-01-19 NOTE — Progress Notes (Signed)
ANTICOAGULATION CONSULT NOTE - Follow UP  Pharmacy Consult for Coumadin Indication: Afib and CVA  Allergies  Allergen Reactions  . Betadine (Povidone Iodine) Itching  . Capoten (Captopril) Other (See Comments)    unknown  . Codone (Hydrocodone) Nausea And Vomiting  . Methadone Nausea And Vomiting  . Penicillins Swelling  . Propoxyphene And Methadone     Intolerance to darvocet   Labs:  Recent Labs  01/16/13 1715 01/16/13 2320 01/17/13 0432 01/17/13 1025 01/18/13 0455 01/19/13 0426 01/19/13 0500  HGB 8.0*  --  7.8*  --  8.9* 9.2*  --   HCT 25.0*  --  23.5*  --  27.6* 28.5*  --   PLT 225  --  194  --  216 249  --   LABPROT 18.5*  --   --   --  17.4*  --  18.1*  INR 1.59*  --   --   --  1.47  --  1.54*  CREATININE 1.57*  --  1.66*  --  1.78* 1.59*  --   TROPONINI <0.30 <0.30 <0.30 <0.30  --   --   --     Estimated Creatinine Clearance: 31.9 ml/min (by C-G formula based on Cr of 1.59).   Assessment: 79 yo F presented 7/16 with c/o dyspnea and chest tightness. Patient was on Coumadin PTA for h/o afib and CVA. INR 1.59 on admit, last Coumadin dose 7/15. Per patient and [facility] note 7/8, patient was on Coumadin 7.5mg  daily except 10mg  on Wed/Sat and INR has been therapeutic. Coumadin consult not ordered to be resume until 7/17, patient missed 7/16 PM dose of Coumadin. Warfarin dose was held 7/18pm also in anticipation of EGD today.  Bilateral LE venous duplex negative for DVT, V/Q scan with no evidence of PE.  INR trending up today  Patient is now s/p EGD - ok to resume warfarin dosing tonight per Dr. Brien Few (who spoke with GI)  Goal of Therapy:  INR 2-3 Monitor platelets by anticoagulation protocol: Yes   Plan:   Repeat Coumadin 12.5mg  po x 1 tonight   Daily PT/INR  Pharmacy will f/u  Darrol Angel, PharmD Pager: [phone] 01/19/2013 3:49 PM

## 2013-01-19 NOTE — Op Note (Signed)
[facility] [address] [place], [address]   COLONOSCOPY PROCEDURE REPORT  PATIENT: [name], [name]  MR#: [id number] BIRTHDATE: 06/28/1933 , 80  yrs. old GENDER: Female ENDOSCOPIST: Louis Meckel, MD REFERRED NG:EXBM Timothy Lasso, M.D. PROCEDURE DATE:  01/19/2013 PROCEDURE:   Colonoscopy, diagnostic ASA CLASS:   Class III INDICATIONS:heme-positive stool. MEDICATIONS: These medications were titrated to patient response per physician's verbal order, Versed 5 mg IV, Fentanyl 75 mcg IV, and Benadryl 25 mg IV  DESCRIPTION OF PROCEDURE:   After the risks benefits and alternatives of the procedure were thoroughly explained, informed consent was obtained.  A digital rectal exam revealed no abnormalities of the rectum.   The Pentax Adult Colon [id number] endoscope was introduced through the anus and advanced to the cecum, which was identified by the ileocecal valve. No adverse events experienced.   Limited by poor preparation. There was a significant amount of retained liquid stool. The quality of the prep was Miralax fair  The instrument was then slowly withdrawn as the colon was fully examined.      COLON FINDINGS: Severe diverticulosis was noted in the sigmoid colon and descending colon.   Moderate diverticulosis was noted in the transverse colon.   The colon mucosa was otherwise normal. There was no fresh or old blood.  Retroflexed views revealed no abnormalities. The time to cecum=  .  Withdrawal time=8 minutes 0 seconds.  The scope was withdrawn and the procedure completed. COMPLICATIONS: There were no complications.  ENDOSCOPIC IMPRESSION: 1.   Severe diverticulosis was noted in the sigmoid colon and descending colon 2.   Moderate diverticulosis was noted in the transverse colon 3.   The colon mucosa was otherwise normal  RECOMMENDATIONS: EGD  eSigned:  Louis Meckel, MD 01/19/2013 10:18 AM   cc:

## 2013-01-20 LAB — PROTIME-INR
INR: 1.53 — ABNORMAL HIGH (ref 0.00–1.49)
Prothrombin Time: 18 seconds — ABNORMAL HIGH (ref 11.6–15.2)

## 2013-01-20 LAB — CBC
Platelets: 236 10*3/uL (ref 150–400)
RDW: 16 % — ABNORMAL HIGH (ref 11.5–15.5)
WBC: 6.7 10*3/uL (ref 4.0–10.5)

## 2013-01-20 LAB — BASIC METABOLIC PANEL
Chloride: 100 mEq/L (ref 96–112)
GFR calc Af Amer: 34 mL/min — ABNORMAL LOW (ref >90)
GFR calc non Af Amer: 29 mL/min — ABNORMAL LOW (ref >90)
Potassium: 3.5 mEq/L (ref 3.5–5.1)

## 2013-01-20 LAB — GLUCOSE, CAPILLARY: Glucose-Capillary: 155 mg/dL — ABNORMAL HIGH (ref 70–99)

## 2013-01-20 MED ORDER — WARFARIN SODIUM 10 MG PO TABS
10.0000 mg | ORAL_TABLET | Freq: Once | ORAL | Status: DC
  Filled 2013-01-20: qty 1

## 2013-01-20 NOTE — Progress Notes (Signed)
ANTICOAGULATION CONSULT NOTE - Follow UP  Pharmacy Consult for Coumadin Indication: Afib and CVA  Allergies  Allergen Reactions  . Betadine (Povidone Iodine) Itching  . Capoten (Captopril) Other (See Comments)    unknown  . Codone (Hydrocodone) Nausea And Vomiting  . Methadone Nausea And Vomiting  . Penicillins Swelling  . Propoxyphene And Methadone     Intolerance to darvocet   Labs:  Recent Labs  01/17/13 1025  01/18/13 0455 01/19/13 0426 01/19/13 0500 01/20/13 0443  HGB  --   < > 8.9* 9.2*  --  8.8*  HCT  --   --  27.6* 28.5*  --  27.7*  PLT  --   --  216 249  --  236  LABPROT  --   --  17.4*  --  18.1* 18.0*  INR  --   --  1.47  --  1.54* 1.53*  CREATININE  --   --  1.78* 1.59*  --  1.63*  TROPONINI <0.30  --   --   --   --   --   < > = values in this interval not displayed.  Estimated Creatinine Clearance: 31 ml/min (by C-G formula based on Cr of 1.63).   Assessment: 79 yo F presented 7/16 with c/o dyspnea and chest tightness. Patient was on Coumadin PTA for h/o afib and CVA. INR 1.59 on admit, last Coumadin dose 7/15. Per patient and [facility] note 7/8, patient was on Coumadin 7.5mg  daily except 10mg  on Wed/Sat and INR has been therapeutic. Coumadin consult not ordered to be resume until 7/17, patient missed 7/16 PM dose of Coumadin. Warfarin dose was held 7/18pm also in anticipation of EGD. Warfarin dosing resumed 7/19pm.  Bilateral LE venous duplex negative for DVT, V/Q scan with no evidence of PE.  INR continues to trend up today  INR may take slightly longer to get to therapeutic range due to missed doses on 7/16 and 7/18  Goal of Therapy:  INR 2-3 Monitor platelets by anticoagulation protocol: Yes   Plan:   Coumadin 10mg  po x 1 tonight   Daily PT/INR  Pharmacy will f/u  Darrol Angel, PharmD Pager: [phone] 01/20/2013 7:30 AM

## 2013-01-20 NOTE — Progress Notes (Signed)
  [facility] Progress Note   Subjective  *No GI complaints**   Objective  Vital signs in last 24 hours: Temp:  [98 F (36.7 C)-98.3 F (36.8 C)] 98.3 F (36.8 C) (07/20 0410) Pulse Rate:  [89-91] 91 (07/20 0410) Resp:  [16-18] 18 (07/20 0410) BP: (111-137)/(30-62) 137/58 mmHg (07/20 0410) SpO2:  [98 %-100 %] 100 % (07/20 0410) Weight:  [207 lb 0.2 oz (93.9 kg)] 207 lb 0.2 oz (93.9 kg) (07/20 0410) Last BM Date: 01/19/13 General:   Alert,  Well-developed,  Tomb female in NAD . Psych:  Alert and cooperative. Normal mood and affect.  Intake/Output from previous day: 07/19 0701 - 07/20 0700 In: 960 [P.O.:960] Out: 700 [Urine:700] Intake/Output this shift: Total I/O In: 240 [P.O.:240] Out: -   Lab Results:  Recent Labs  01/18/13 0455 01/19/13 0426 01/20/13 0443  WBC 6.9 8.0 6.7  HGB 8.9* 9.2* 8.8*  HCT 27.6* 28.5* 27.7*  PLT 216 249 236   BMET  Recent Labs  01/18/13 0455 01/19/13 0426 01/20/13 0443  NA 139 138 137  K 3.3* 3.2* 3.5  CL 101 99 100  CO2 29 29 26   GLUCOSE 146* 98 124*  BUN 33* 29* 24*  CREATININE 1.78* 1.59* 1.63*  CALCIUM 8.9 8.9 8.9   LFT No results found for this basename: PROT, ALBUMIN, AST, ALT, ALKPHOS, BILITOT, BILIDIR, IBILI,  in the last 72 hours PT/INR  Recent Labs  01/19/13 0500 01/20/13 0443  LABPROT 18.1* 18.0*  INR 1.54* 1.53*   Hepatitis Panel No results found for this basename: HEPBSAG, HCVAB, HEPAIGM, HEPBIGM,  in the last 72 hours  Studies/Results: No results found.    Assessment & Plan  *1.  Anemia. Etiology is probably multifactorial. She does have a friable gastric polyp which could account for heme-positive stool although I doubt is responsible for her anemia. Await biopsy results to determine whether the polyp should be removed. If adenomatous I would proceed with polypectomy.  Patient will followup with Dr. Jeani Hawking ** Active Problems:   Atrial fibrillation   Chronic anticoagulation  CKD (chronic kidney disease) stage 3, GFR 30-59 ml/min   Diabetes mellitus   Coronary artery disease   DOE (dyspnea on exertion)   Benign neoplasm of stomach   Adenomatous duodenal polyp   Diverticulosis of colon (without mention of hemorrhage)     LOS: 4 days   [name]  01/20/2013, 1:04 PM

## 2013-01-20 NOTE — Progress Notes (Signed)
Subjective:  Tolerated colonoscopy well yesterday with findings of diverticulitis. No chest pain or shortness of breath at the present time and might go home.  Objective:  Vital Signs in the last 24 hours: BP 137/58  Pulse 91  Temp(Src) 98.3 F (36.8 C) (Oral)  Resp 18  Ht 5\' 3"  (1.6 m)  Wt 93.9 kg (207 lb 0.2 oz)  BMI 36.68 kg/m2  SpO2 100%  Physical Exam: Pleasant [name] female in no acute distress Lungs:  Clear Cardiac:  Regular rhythm, normal S1 and S2, no S3, neck veins elevated with V waves of tricuspid regurgitation present, 1-2/6 murmur Abdomen:  Soft, nontender, no masses Extremities: 1+ edema   Intake/Output from previous day: 07/19 0701 - 07/20 0700 In: 960 [P.O.:960] Out: 700 [Urine:700] Weight Filed Weights   01/18/13 0600 01/19/13 0453 01/20/13 0410  Weight: 94.3 kg (207 lb 14.3 oz) 94.394 kg (208 lb 1.6 oz) 93.9 kg (207 lb 0.2 oz)    Lab Results: Basic Metabolic Panel:  Recent Labs  13/24/40 0426 01/20/13 0443  NA 138 137  K 3.2* 3.5  CL 99 100  CO2 29 26  GLUCOSE 98 124*  BUN 29* 24*  CREATININE 1.59* 1.63*    CBC:  Recent Labs  01/19/13 0426 01/20/13 0443  WBC 8.0 6.7  HGB 9.2* 8.8*  HCT 28.5* 27.7*  MCV 90.8 91.1  PLT 249 236    BNP    Component Value Date/Time   PROBNP 811.7* 01/19/2013 0426    PROTIME: Lab Results  Component Value Date   INR 1.53* 01/20/2013   INR 1.54* 01/19/2013   INR 1.47 01/18/2013    Telemetry: Atrial fibrillation  Assessment/Plan:  1. Dyspnea likely due to anemia which is better 2. History of atrial fibrillation 3. Coronary artery disease 4. Combined systolic and diastolic heart failure with moderate to severe tricuspid regurgitation   recommendations:  Her dyspnea has improved with correction of the anemia. He is reasonable to be discharged from a cardiac viewpoint and should followup with cardiology in the next month or so.  Darden Palmer  MD [facility]  01/20/2013, 8:39  AM

## 2013-01-21 ENCOUNTER — Encounter (HOSPITAL_COMMUNITY): Payer: Self-pay | Admitting: Gastroenterology

## 2013-01-21 LAB — TYPE AND SCREEN
ABO/RH(D): A POS
Antibody Screen: NEGATIVE
Donor AG Type: NEGATIVE
Donor AG Type: NEGATIVE
PT AG Type: NEGATIVE

## 2013-01-22 ENCOUNTER — Telehealth: Payer: Self-pay | Admitting: Gastroenterology

## 2013-01-22 NOTE — Telephone Encounter (Signed)
Pt was recently in the hospital and had some procedures with Dr. Arlyce Dice. Pt actually has GI history with Dr. Elnoria Howard. Should pt have follow-up OV with Dr. Elnoria Howard? Please advise.

## 2013-01-22 NOTE — Telephone Encounter (Signed)
Spoke with pts husband and let him know to call Dr. Haywood Pao office for OV.

## 2013-01-22 NOTE — Telephone Encounter (Signed)
She is Dr. Haywood Pao patient and should follow up with him

## 2013-01-25 ENCOUNTER — Telehealth: Payer: Self-pay | Admitting: *Deleted

## 2013-01-25 NOTE — Telephone Encounter (Signed)
Dr Arlyce Dice, pt had ECL at [facility] on 01/19/13 and results of path are back. Do you want pt seen in office? Thanks.

## 2013-01-28 ENCOUNTER — Ambulatory Visit (HOSPITAL_BASED_OUTPATIENT_CLINIC_OR_DEPARTMENT_OTHER): Payer: Medicare Other

## 2013-01-28 ENCOUNTER — Other Ambulatory Visit (HOSPITAL_BASED_OUTPATIENT_CLINIC_OR_DEPARTMENT_OTHER): Payer: Medicare Other | Admitting: Lab

## 2013-01-28 VITALS — BP 140/80 | HR 84 | Temp 98.5°F

## 2013-01-28 DIAGNOSIS — N189 Chronic kidney disease, unspecified: Secondary | ICD-10-CM

## 2013-01-28 DIAGNOSIS — D631 Anemia in chronic kidney disease: Secondary | ICD-10-CM

## 2013-01-28 DIAGNOSIS — N039 Chronic nephritic syndrome with unspecified morphologic changes: Secondary | ICD-10-CM

## 2013-01-28 LAB — CBC WITH DIFFERENTIAL/PLATELET
Basophils Absolute: 0 10*3/uL (ref 0.0–0.1)
EOS%: 2.3 % (ref 0.0–7.0)
HGB: 8.8 g/dL — ABNORMAL LOW (ref 11.6–15.9)
LYMPH%: 23.3 % (ref 14.0–49.7)
MCH: 28.6 pg (ref 25.1–34.0)
MCV: 91.6 fL (ref 79.5–101.0)
MONO%: 14.2 % — ABNORMAL HIGH (ref 0.0–14.0)
Platelets: 229 10*3/uL (ref 145–400)
RDW: 15.7 % — ABNORMAL HIGH (ref 11.2–14.5)

## 2013-01-28 MED ORDER — DARBEPOETIN ALFA-POLYSORBATE 200 MCG/0.4ML IJ SOLN
200.0000 ug | Freq: Once | INTRAMUSCULAR | Status: AC
  Administered 2013-01-28: 200 ug via SUBCUTANEOUS
  Filled 2013-01-28: qty 0.4

## 2013-01-28 NOTE — Telephone Encounter (Signed)
She is a pt of Dr. Elnoria Howard

## 2013-01-28 NOTE — Telephone Encounter (Signed)
Noted  

## 2013-01-29 ENCOUNTER — Ambulatory Visit (INDEPENDENT_AMBULATORY_CARE_PROVIDER_SITE_OTHER): Payer: Medicare Other | Admitting: *Deleted

## 2013-01-29 DIAGNOSIS — Z7901 Long term (current) use of anticoagulants: Secondary | ICD-10-CM

## 2013-01-29 DIAGNOSIS — I4891 Unspecified atrial fibrillation: Secondary | ICD-10-CM

## 2013-01-31 ENCOUNTER — Encounter: Payer: Self-pay | Admitting: Cardiology

## 2013-01-31 ENCOUNTER — Ambulatory Visit (INDEPENDENT_AMBULATORY_CARE_PROVIDER_SITE_OTHER): Payer: Medicare Other | Admitting: Cardiology

## 2013-01-31 VITALS — BP 138/70 | HR 82 | Ht 63.0 in | Wt 217.0 lb

## 2013-01-31 DIAGNOSIS — Z7901 Long term (current) use of anticoagulants: Secondary | ICD-10-CM

## 2013-01-31 DIAGNOSIS — I5022 Chronic systolic (congestive) heart failure: Secondary | ICD-10-CM

## 2013-01-31 DIAGNOSIS — I4891 Unspecified atrial fibrillation: Secondary | ICD-10-CM

## 2013-01-31 DIAGNOSIS — I2510 Atherosclerotic heart disease of native coronary artery without angina pectoris: Secondary | ICD-10-CM

## 2013-01-31 DIAGNOSIS — I509 Heart failure, unspecified: Secondary | ICD-10-CM

## 2013-01-31 NOTE — Progress Notes (Signed)
[name] Date of Birth: 1932/08/07 Medical Record #[id number]  History of Present Illness: [name] is seen back today for a follow up visit.  She has had atrial fib and CHF. EF was down to 30%. Not a candidate for antiarrhythmics due to her multiple comorbidities/intolerances. A BiV pacemaker was implanted followed by AV nodal ablation in December 2013.  Echo was repeated in February 2014 and had improved with an EF up to 45 to 50%.  She was recently hospitalized from July 16 2 July 20. She presented with symptoms of marked fatigue and dyspnea on exertion. She was found to be anemic with hemoglobin down to 7.8. She had a heme positive stool. She was transfused one unit of packed cells. GI performed endoscopy and she was found to have diverticulosis with otherwise normal colon. There was a gastric polyp and duodenal polyp. Biopsy of the gastric lesion was hypertrophic. Repeat hemoglobin 3 days ago was 8.8. She states she is still very fatigued. 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 She does have a nonproductive cough. During her hospital stay her cardiac status was stable. Chest x-ray was clear. She had a VQ scan and lower extremity Dopplers that were negative for the VTE. She currently denies any chest pain or increased edema.   Current Outpatient Prescriptions on File Prior to Visit  Medication Sig Dispense Refill  . amitriptyline (ELAVIL) 25 MG tablet Take 75 mg by mouth at bedtime.        Marland Kitchen atorvastatin (LIPITOR) 40 MG tablet Take 40 mg by mouth every evening.       . diltiazem (CARDIZEM CD) 240 MG 24 hr capsule Take 1 capsule (240 mg total) by mouth daily.  30 capsule  6  . donepezil (ARICEPT) 5 MG tablet Take 5 mg by mouth at bedtime.      Marland Kitchen esomeprazole (NEXIUM) 40 MG capsule Take 40 mg by mouth daily before breakfast.        . ezetimibe (ZETIA) 10 MG tablet Take 10 mg by mouth every evening.       . furosemide (LASIX) 40 MG tablet Take 60 mg by mouth every morning.      . insulin glargine  (LANTUS) 100 UNIT/ML injection Inject 35 Units into the skin 2 (two) times daily.      . insulin lispro (HUMALOG) 100 UNIT/ML injection Inject 0-20 Units into the skin 2 (two) times daily as needed for high blood sugar. Home sliding scale If blood sugar is <200 take 15 units If blood sugar is >200 take 20 units If blood sugar is less than 70, don't take any      . levothyroxine (SYNTHROID, LEVOTHROID) 100 MCG tablet Take 100 mcg by mouth every morning.       . metoprolol succinate (TOPROL-XL) 50 MG 24 hr tablet Take 50 mg by mouth every morning. Take with or immediately following a meal.      . Multiple Vitamins-Minerals (EYE VITAMINS PO) Take 1 tablet by mouth 2 (two) times daily.       . nitroGLYCERIN (NITROSTAT) 0.4 MG SL tablet Place 0.4 mg under the tongue every 5 (five) minutes as needed. For chest pain      . potassium chloride SA (K-DUR,KLOR-CON) 20 MEQ tablet Take 10 mEq by mouth every morning.       . warfarin (COUMADIN) 5 MG tablet Take 7.5-10 mg by mouth every evening. Wednesday and Saturday take 2 tablets. Sunday, Monday, Tuesday, Thursday and Friday take 1.5 tabs.      . [  DISCONTINUED] enoxaparin (LOVENOX) 100 MG/ML SOLN Inject into the skin every 12 (twelve) hours.        . [DISCONTINUED] simvastatin (ZOCOR) 80 MG tablet Take 80 mg by mouth at bedtime.         No current facility-administered medications on file prior to visit.    Allergies  Allergen Reactions  . Betadine (Povidone Iodine) Itching  . Capoten (Captopril) Other (Alexander Comments)    unknown  . Codone (Hydrocodone) Nausea And Vomiting  . Methadone Nausea And Vomiting  . Penicillins Swelling  . Propoxyphene And Methadone     Intolerance to darvocet    Past Medical History  Diagnosis Date  . Coronary artery disease     a. Severe two vessel; s/p CABG (LIMA-LAD, SVG-diag, SVG-PDA);  b. 05/2012 NSTEMI in setting of rapid aflutter;  c. 06/2012 Lexiscan cardiolite: small, mild reversible defect in lateral wall->mild  ischemia, low-risk->med Rx.  Marland Kitchen Hypertension   . Type II diabetes mellitus   . Hyperlipidemia   . Diabetic neuropathy   . CVA (cerebral vascular accident) 2004  . Hypothyroidism   . Chronic anemia   . GERD (gastroesophageal reflux disease)   . Arthritis   . Memory loss, short term   . COPD (chronic obstructive pulmonary disease)   . Blood transfusion without reported diagnosis   . Chronic combined systolic and diastolic CHF (congestive heart failure)     a. 07/2012 Echo: EF 30%, Gr II DD, Mild AS/MR, Mod-Sev TR, mild bi-atrial and RV dil, PASP .  . Moderate mitral regurgitation     a. mod by TEE 2012, mild by echo 2013, 07/2012  . Severe tricuspid regurgitation     a. Severe by TEE 2012, mod by echo 2013, mod-sev by echo 07/2012  . Chronic vulvitis 07/1993  . Idiopathic anemia 2006  . Atrial fibrillation     a. Failed amiodarone (prolonged QT), not a candidate for class IC meds due to her CAD. No Multaq due to CHF hx;  b. 07/2012 s/p SJM Anthem RF Bi-V PPM, ser # [id number] and AV node RFCA;  c. chronic coumadin.  . Atrial flutter   . Pacemaker   . GI bleed     a. 10/2011 - Dr Elnoria Howard did scope which showed hiatal hernia, colon revealed diverticula (diverticular bleed), internal/ext hemorrhoids.    Past Surgical History  Procedure Laterality Date  . Coronary artery bypass graft  06/2009    X3, LIMA to LAD, SVG to diagonal, SVG to the posterior descending artery.  . Tonsillectomy    . Knee arthroscopy  08/2001  . Transthoracic echocardiogram  11/12/2010     Ejection fraction felt to be around 50%.  Wall thickness was increased in a pattern of mild LVH.  Moderately dilated left atrium.  Mildly dilated right atrium. Right ventricle was mildly dilated with mildly reduced systolic function  . Cardiac catheterization  06/09/2009    inferior wall hypokinesia with ejection fraction of 55%.  . Abdominal hysterectomy    . Joint replacement    . Tee without cardioversion  05/23/2011     Procedure: TRANSESOPHAGEAL ECHOCARDIOGRAM (TEE);  Surgeon: Lewayne Bunting, MD;  Location: [facility] ENDOSCOPY;  Service: Cardiovascular;  Laterality: N/A;  . Incision and drainage abscess  05/07/2012    Procedure: INCISION AND DRAINAGE ABSCESS;  Surgeon: Ardeth Sportsman, MD;  Location: [facility] OR;  Service: General;  Laterality: N/A;  Groin wound  . Total abdominal hysterectomy  1979  . Breast biopsy  Left  .  Bilateral salpingectomy  10/2000  . Breast biopsy  09/18/2009    fibrocystic change  . Coronary artery bypass graft  06/2009  . Pacemaker insertion  08/2012  . Colonoscopy N/A 01/19/2013    Procedure: COLONOSCOPY;  Surgeon: Louis Meckel, MD;  Location: [facility] ENDOSCOPY;  Service: Endoscopy;  Laterality: N/A;  . Esophagogastroduodenoscopy N/A 01/19/2013    Procedure: ESOPHAGOGASTRODUODENOSCOPY (EGD);  Surgeon: Louis Meckel, MD;  Location: [facility] END[facility]COPY;  Service: Endoscopy;  Laterality: N/A;    History  Smoking status  . Former Smoker  . Types: Cigarettes  Smokeless tobacco  . Never Used    History  Alcohol Use No    Family History  Problem Relation Age of Onset  . Colon cancer Mother   . Stroke Mother   . Hypertension Mother   . Cancer Mother     breast  . Cancer Father     colon  . Heart attack Father     x2  . Renal Disease Sister     dialysis-twin sister  . Renal Disease Sister     renal bypass    Review of Systems: The review of systems is per the HPI.  She has chronic anemia and received an Aranesp injection today. All other systems were reviewed and are negative.  Physical Exam: BP 138/70  Pulse 82  Ht 5\' 3"  (1.6 m)  Wt 217 lb (98.431 kg)  BMI 38.45 kg/m2  SpO2 96% Patient is  pleasant and in no acute distress. She has memory loss. Skin is warm and dry. Color is normal.  HEENT is unremarkable. Normocephalic/atraumatic. PERRL. Sclera are nonicteric. Neck is supple. No masses. No JVD. Lungs are clear. Cardiac exam shows a regular rate and rhythm. There is a grade 2/6  systolic murmur heard at the left sternal border.. Abdomen is soft. Extremities are quite full and with 1+ brawny edema. Gait and ROM are intact. No gross neurologic deficits noted.  LABORATORY DATA:  Lab Results  Component Value Date   WBC 8.2 01/28/2013   HGB 8.8* 01/28/2013   HCT 28.2* 01/28/2013   PLT 229 01/28/2013   GLUCOSE 124* 01/20/2013   CHOL 160 05/26/2012   TRIG 80 05/26/2012   HDL 29* 05/26/2012   LDLCALC 115* 05/26/2012   ALT 16 08/30/2012   AST 27 08/30/2012   NA 137 01/20/2013   K 3.5 01/20/2013   CL 100 01/20/2013   CREATININE 1.63* 01/20/2013   BUN 24* 01/20/2013   CO2 26 01/20/2013   TSH 1.257 01/18/2013   INR 2.6 01/29/2013   HGBA1C 8.0* 01/16/2013    Assessment / Plan:  1. Chronic systolic and diastolic heart failure -volume status is stable. I stressed the importance of sodium restriction. We'll continue her current Lasix dose 60 mg per day.  2. S/p BiV pacemaker implant with AV node ablation - to [facility] Dr. Ladona Ridgel tomorrow in the device clinic.   3. Atrial fib - not a candidate for antiarrhythmic therapy - now s/p AV node ablation. Clinically she has had a significant improvement since her AV node ablation.  4. Chronic coumadin therapy. INR was 2.6 this week.  5. Memory loss.  6. CAD status post CABG. No anginal symptoms.  7. Anemia with heme positive stool. I recommended stopping her aspirin at this point. Hemoglobin is close to her baseline now.

## 2013-01-31 NOTE — Patient Instructions (Signed)
Stop ASA   Continue your other therapy  I will see you in 3 months.

## 2013-02-01 ENCOUNTER — Encounter: Payer: Self-pay | Admitting: Internal Medicine

## 2013-02-01 ENCOUNTER — Ambulatory Visit (INDEPENDENT_AMBULATORY_CARE_PROVIDER_SITE_OTHER): Payer: Medicare Other | Admitting: Internal Medicine

## 2013-02-01 VITALS — BP 146/66 | HR 82 | Ht 62.0 in | Wt 219.0 lb

## 2013-02-01 DIAGNOSIS — Z950 Presence of cardiac pacemaker: Secondary | ICD-10-CM

## 2013-02-01 DIAGNOSIS — R0602 Shortness of breath: Secondary | ICD-10-CM

## 2013-02-01 DIAGNOSIS — I509 Heart failure, unspecified: Secondary | ICD-10-CM

## 2013-02-01 DIAGNOSIS — I4891 Unspecified atrial fibrillation: Secondary | ICD-10-CM

## 2013-02-01 DIAGNOSIS — I5042 Chronic combined systolic (congestive) and diastolic (congestive) heart failure: Secondary | ICD-10-CM

## 2013-02-01 LAB — PACEMAKER DEVICE OBSERVATION
AL IMPEDENCE PM: 350 Ohm
ATRIAL PACING PM: 40
BAMS-0001: 140 {beats}/min
BAMS-0003: 90 {beats}/min
BATTERY VOLTAGE: 2.9027 V
LV LEAD THRESHOLD: 1.75 V
RV LEAD AMPLITUDE: 4.1 mv
VENTRICULAR PACING PM: 99

## 2013-02-01 NOTE — Progress Notes (Signed)
HPI [name] returns today for followup. She is a 79 year old woman with a history of atrial fibrillation, rapid ventricular response, status post AV node ablation, status post biventricular pacemaker insertion. Her ejection fraction improved from 30% to 45% after biventricular pacing based on her echo in February 2014. In the interim, she has had fatigue, weakness, and shortness of breath. She also has occasional chest discomfort though it is nonexertional. She denies frank syncope. She has chronic peripheral edema. She denies dietary indiscretion. She has developed heme positive anemia and has undergone evaluation which is demonstrated polyps but no other obvious cause of her anemia. Finally, the patient notes weight gain. Allergies  Allergen Reactions  . Betadine (Povidone Iodine) Itching  . Capoten (Captopril) Other (See Comments)    unknown  . Codone (Hydrocodone) Nausea And Vomiting  . Methadone Nausea And Vomiting  . Penicillins Swelling  . Propoxyphene And Methadone     Intolerance to darvocet     Current Outpatient Prescriptions  Medication Sig Dispense Refill  . amitriptyline (ELAVIL) 25 MG tablet Take 75 mg by mouth at bedtime.        Marland Kitchen atorvastatin (LIPITOR) 40 MG tablet Take 40 mg by mouth every evening.       . diltiazem (CARDIZEM CD) 240 MG 24 hr capsule Take 1 capsule (240 mg total) by mouth daily.  30 capsule  6  . donepezil (ARICEPT) 5 MG tablet Take 5 mg by mouth at bedtime.      Marland Kitchen esomeprazole (NEXIUM) 40 MG capsule Take 40 mg by mouth daily before breakfast.        . ezetimibe (ZETIA) 10 MG tablet Take 10 mg by mouth every evening.       . furosemide (LASIX) 40 MG tablet Take 60 mg by mouth every morning.      . insulin glargine (LANTUS) 100 UNIT/ML injection Inject 35 Units into the skin 2 (two) times daily.      . insulin lispro (HUMALOG) 100 UNIT/ML injection Inject 0-20 Units into the skin 2 (two) times daily as needed for high blood sugar. Home sliding scale If  blood sugar is <200 take 15 units If blood sugar is >200 take 20 units If blood sugar is less than 70, don't take any      . levothyroxine (SYNTHROID, LEVOTHROID) 100 MCG tablet Take 100 mcg by mouth every morning.       . metoprolol succinate (TOPROL-XL) 50 MG 24 hr tablet Take 50 mg by mouth every morning. Take with or immediately following a meal.      . Multiple Vitamins-Minerals (EYE VITAMINS PO) Take 1 tablet by mouth 2 (two) times daily.       . nitroGLYCERIN (NITROSTAT) 0.4 MG SL tablet Place 0.4 mg under the tongue every 5 (five) minutes as needed. For chest pain      . potassium chloride SA (K-DUR,KLOR-CON) 20 MEQ tablet Take 10 mEq by mouth every morning.       . warfarin (COUMADIN) 5 MG tablet Take 7.5-10 mg by mouth every evening. Wednesday and Saturday take 2 tablets. Sunday, Monday, Tuesday, Thursday and Friday take 1.5 tabs.      . [DISCONTINUED] enoxaparin (LOVENOX) 100 MG/ML SOLN Inject into the skin every 12 (twelve) hours.        . [DISCONTINUED] simvastatin (ZOCOR) 80 MG tablet Take 80 mg by mouth at bedtime.         No current facility-administered medications for this visit.     Past  Medical History  Diagnosis Date  . Coronary artery disease     a. Severe two vessel; s/p CABG (LIMA-LAD, SVG-diag, SVG-PDA);  b. 05/2012 NSTEMI in setting of rapid aflutter;  c. 06/2012 Lexiscan cardiolite: small, mild reversible defect in lateral wall->mild ischemia, low-risk->med Rx.  Marland Kitchen Hypertension   . Type II diabetes mellitus   . Hyperlipidemia   . Diabetic neuropathy   . CVA (cerebral vascular accident) 2004  . Hypothyroidism   . Chronic anemia   . GERD (gastroesophageal reflux disease)   . Arthritis   . Memory loss, short term   . COPD (chronic obstructive pulmonary disease)   . Blood transfusion without reported diagnosis   . Chronic combined systolic and diastolic CHF (congestive heart failure)     a. 07/2012 Echo: EF 30%, Gr II DD, Mild AS/MR, Mod-Sev TR, mild bi-atrial  and RV dil, PASP .  . Moderate mitral regurgitation     a. mod by TEE 2012, mild by echo 2013, 07/2012  . Severe tricuspid regurgitation     a. Severe by TEE 2012, mod by echo 2013, mod-sev by echo 07/2012  . Chronic vulvitis 07/1993  . Idiopathic anemia 2006  . Atrial fibrillation     a. Failed amiodarone (prolonged QT), not a candidate for class IC meds due to her CAD. No Multaq due to CHF hx;  b. 07/2012 s/p SJM Anthem RF Bi-V PPM, ser # [id number] and AV node RFCA;  c. chronic coumadin.  . Atrial flutter   . Pacemaker   . GI bleed     a. 10/2011 - Dr Elnoria Howard did scope which showed hiatal hernia, colon revealed diverticula (diverticular bleed), internal/ext hemorrhoids.    ROS:   All systems reviewed and negative except as noted in the HPI.   Past Surgical History  Procedure Laterality Date  . Coronary artery bypass graft  06/2009    X3, LIMA to LAD, SVG to diagonal, SVG to the posterior descending artery.  . Tonsillectomy    . Knee arthroscopy  08/2001  . Transthoracic echocardiogram  11/12/2010     Ejection fraction felt to be around 50%.  Wall thickness was increased in a pattern of mild LVH.  Moderately dilated left atrium.  Mildly dilated right atrium. Right ventricle was mildly dilated with mildly reduced systolic function  . Cardiac catheterization  06/09/2009    inferior wall hypokinesia with ejection fraction of 55%.  . Abdominal hysterectomy    . Joint replacement    . Tee without cardioversion  05/23/2011    Procedure: TRANSESOPHAGEAL ECHOCARDIOGRAM (TEE);  Surgeon: Lewayne Bunting, MD;  Location: [facility] ENDOSCOPY;  Service: Cardiovascular;  Laterality: N/A;  . Incision and drainage abscess  05/07/2012    Procedure: INCISION AND DRAINAGE ABSCESS;  Surgeon: Ardeth Sportsman, MD;  Location: [facility] OR;  Service: General;  Laterality: N/A;  Groin wound  . Total abdominal hysterectomy  1979  . Breast biopsy  Left  . Bilateral salpingectomy  10/2000  . Breast biopsy  09/18/2009     fibrocystic change  . Coronary artery bypass graft  06/2009  . Pacemaker insertion  08/2012  . Colonoscopy N/A 01/19/2013    Procedure: COLONOSCOPY;  Surgeon: Louis Meckel, MD;  Location: [facility] ENDOSCOPY;  Service: Endoscopy;  Laterality: N/A;  . Esophagogastroduodenoscopy N/A 01/19/2013    Procedure: ESOPHAGOGASTRODUODENOSCOPY (EGD);  Surgeon: Louis Meckel, MD;  Location: [facility] ENDOSCOPY;  Service: Endoscopy;  Laterality: N/A;     Family History  Problem Relation Age of  Onset  . Colon cancer Mother   . Stroke Mother   . Hypertension Mother   . Cancer Mother     breast  . Cancer Father     colon  . Heart attack Father     x2  . Renal Disease Sister     dialysis-twin sister  . Renal Disease Sister     renal bypass     History   Social History  . Marital Status: Married    Spouse Name: N/A    Number of Children: 2  . Years of Education: N/A   Occupational History  . teacher    Social History Main Topics  . Smoking status: Former Smoker    Types: Cigarettes  . Smokeless tobacco: Never Used  . Alcohol Use: No  . Drug Use: No  . Sexually Active: No     Comment: hysterectomy   Other Topics Concern  . Not on file   Social History Narrative   Lives in [place] with spouse     BP 146/66  Pulse 82  Ht 5\' 2"  (1.575 m)  Wt 219 lb (99.338 kg)  BMI 40.05 kg/m2  Physical Exam:  Obese and chronically ill appearing 79 year old woman,  NAD HEENT: Unremarkable Neck:  7 cm JVD, no thyromegally Lungs:  Clear with no wheezes, rales, or rhonchi. HEART:  Regular rate rhythm, no murmurs, no rubs, no clicks Abd:  soft, positive bowel sounds, no organomegally, no rebound, no guarding Ext:  2 plus pulses, 2+ woody edema, no cyanosis, no clubbing Skin:  No rashes no nodules Neuro:  CN II through XII intact, motor grossly intact   DEVICE  Normal device function.  See PaceArt for details.   Assess/Plan:

## 2013-02-01 NOTE — Assessment & Plan Note (Signed)
The patient's heart failure symptoms are class III. The etiology is still unclear as to why she is worsened. Her last 2-D echo in February demonstrated improvement in her left ventricular function. Chest x-ray done recently shows an enlarged cardiac silhouette. I've recommended the patient undergo repeat 2-D echo to evaluate her dyspnea and enlarged cardiac silhouette. I am concerned about pericardial effusion although she has no clinical evidence of cardiac tamponade on exam today.

## 2013-02-01 NOTE — Assessment & Plan Note (Signed)
Her ventricular rate is now well controlled. She will continue with chronic anticoagulation.

## 2013-02-01 NOTE — Patient Instructions (Signed)
Your physician wants you to follow-up in: 07/2013 with Dr Court Joy will receive a reminder letter in the mail two months in advance. If you don't receive a letter, please call our office to schedule the follow-up appointment.   Your physician has requested that you have an echocardiogram. Echocardiography is a painless test that uses sound waves to create images of your heart. It provides your doctor with information about the size and shape of your heart and how well your heart's chambers and valves are working. This procedure takes approximately one hour. There are no restrictions for this procedure.

## 2013-02-01 NOTE — Assessment & Plan Note (Signed)
Her [employer] device is working normally. We'll plan to recheck in several months.

## 2013-02-06 ENCOUNTER — Other Ambulatory Visit: Payer: Self-pay

## 2013-02-08 ENCOUNTER — Telehealth: Payer: Self-pay | Admitting: Cardiology

## 2013-02-08 MED ORDER — DILTIAZEM HCL ER COATED BEADS 240 MG PO CP24: 240.0000 mg | ORAL_CAPSULE | Freq: Every day | ORAL | Status: DC

## 2013-02-08 NOTE — Telephone Encounter (Signed)
Returned call to patient's daughter she stated her mother needs refill on diltiazem.Refill sent to pharmacy.

## 2013-02-08 NOTE — Telephone Encounter (Signed)
New Problem   script refill// [facility] has called several times with no response// has asked dtr to follow up.. Please assist

## 2013-02-13 ENCOUNTER — Ambulatory Visit (INDEPENDENT_AMBULATORY_CARE_PROVIDER_SITE_OTHER): Payer: Medicare Other | Admitting: *Deleted

## 2013-02-13 ENCOUNTER — Ambulatory Visit (HOSPITAL_COMMUNITY): Payer: Medicare Other | Attending: Cardiovascular Disease | Admitting: Radiology

## 2013-02-13 DIAGNOSIS — Z7901 Long term (current) use of anticoagulants: Secondary | ICD-10-CM

## 2013-02-13 DIAGNOSIS — R0609 Other forms of dyspnea: Secondary | ICD-10-CM | POA: Insufficient documentation

## 2013-02-13 DIAGNOSIS — R0602 Shortness of breath: Secondary | ICD-10-CM

## 2013-02-13 DIAGNOSIS — Z8673 Personal history of transient ischemic attack (TIA), and cerebral infarction without residual deficits: Secondary | ICD-10-CM | POA: Insufficient documentation

## 2013-02-13 DIAGNOSIS — E669 Obesity, unspecified: Secondary | ICD-10-CM | POA: Insufficient documentation

## 2013-02-13 DIAGNOSIS — I509 Heart failure, unspecified: Secondary | ICD-10-CM | POA: Insufficient documentation

## 2013-02-13 DIAGNOSIS — I4891 Unspecified atrial fibrillation: Secondary | ICD-10-CM

## 2013-02-13 DIAGNOSIS — I428 Other cardiomyopathies: Secondary | ICD-10-CM | POA: Insufficient documentation

## 2013-02-13 DIAGNOSIS — I2510 Atherosclerotic heart disease of native coronary artery without angina pectoris: Secondary | ICD-10-CM | POA: Insufficient documentation

## 2013-02-13 DIAGNOSIS — R0989 Other specified symptoms and signs involving the circulatory and respiratory systems: Secondary | ICD-10-CM | POA: Insufficient documentation

## 2013-02-13 DIAGNOSIS — R5381 Other malaise: Secondary | ICD-10-CM | POA: Insufficient documentation

## 2013-02-13 DIAGNOSIS — E119 Type 2 diabetes mellitus without complications: Secondary | ICD-10-CM | POA: Insufficient documentation

## 2013-02-13 LAB — POCT INR: INR: 2

## 2013-02-13 NOTE — Progress Notes (Addendum)
Echocardiogram performed. Report faxed  

## 2013-02-25 ENCOUNTER — Other Ambulatory Visit (HOSPITAL_BASED_OUTPATIENT_CLINIC_OR_DEPARTMENT_OTHER): Payer: Medicare Other | Admitting: Lab

## 2013-02-25 ENCOUNTER — Ambulatory Visit (HOSPITAL_BASED_OUTPATIENT_CLINIC_OR_DEPARTMENT_OTHER): Payer: Medicare Other

## 2013-02-25 DIAGNOSIS — N189 Chronic kidney disease, unspecified: Secondary | ICD-10-CM

## 2013-02-25 DIAGNOSIS — D631 Anemia in chronic kidney disease: Secondary | ICD-10-CM

## 2013-02-25 LAB — CBC WITH DIFFERENTIAL/PLATELET
BASO%: 0.4 % (ref 0.0–2.0)
EOS%: 1.4 % (ref 0.0–7.0)
HCT: 28.7 % — ABNORMAL LOW (ref 34.8–46.6)
LYMPH%: 23.2 % (ref 14.0–49.7)
MCH: 28 pg (ref 25.1–34.0)
MCHC: 31.7 g/dL (ref 31.5–36.0)
NEUT%: 62.7 % (ref 38.4–76.8)
Platelets: 226 10*3/uL (ref 145–400)
RBC: 3.25 10*6/uL — ABNORMAL LOW (ref 3.70–5.45)
lymph#: 1.7 10*3/uL (ref 0.9–3.3)

## 2013-02-25 MED ORDER — DARBEPOETIN ALFA-POLYSORBATE 200 MCG/0.4ML IJ SOLN
200.0000 ug | Freq: Once | INTRAMUSCULAR | Status: AC
  Administered 2013-02-25: 200 ug via SUBCUTANEOUS
  Filled 2013-02-25: qty 0.4

## 2013-03-06 ENCOUNTER — Ambulatory Visit (INDEPENDENT_AMBULATORY_CARE_PROVIDER_SITE_OTHER): Payer: Medicare Other | Admitting: *Deleted

## 2013-03-06 DIAGNOSIS — I4891 Unspecified atrial fibrillation: Secondary | ICD-10-CM

## 2013-03-06 DIAGNOSIS — Z7901 Long term (current) use of anticoagulants: Secondary | ICD-10-CM

## 2013-03-25 ENCOUNTER — Other Ambulatory Visit: Payer: Medicare Other | Admitting: Lab

## 2013-03-25 ENCOUNTER — Ambulatory Visit: Payer: Medicare Other

## 2013-03-27 ENCOUNTER — Ambulatory Visit (INDEPENDENT_AMBULATORY_CARE_PROVIDER_SITE_OTHER): Payer: Medicare Other | Admitting: *Deleted

## 2013-03-27 DIAGNOSIS — I4891 Unspecified atrial fibrillation: Secondary | ICD-10-CM

## 2013-03-27 DIAGNOSIS — Z7901 Long term (current) use of anticoagulants: Secondary | ICD-10-CM

## 2013-03-28 ENCOUNTER — Other Ambulatory Visit: Payer: Self-pay | Admitting: Medical Oncology

## 2013-03-28 DIAGNOSIS — D472 Monoclonal gammopathy: Secondary | ICD-10-CM

## 2013-03-28 DIAGNOSIS — N189 Chronic kidney disease, unspecified: Secondary | ICD-10-CM

## 2013-03-29 ENCOUNTER — Ambulatory Visit (HOSPITAL_BASED_OUTPATIENT_CLINIC_OR_DEPARTMENT_OTHER): Payer: Medicare Other | Admitting: Internal Medicine

## 2013-03-29 ENCOUNTER — Telehealth: Payer: Self-pay | Admitting: Internal Medicine

## 2013-03-29 ENCOUNTER — Other Ambulatory Visit (HOSPITAL_BASED_OUTPATIENT_CLINIC_OR_DEPARTMENT_OTHER): Payer: Medicare Other | Admitting: Lab

## 2013-03-29 ENCOUNTER — Encounter: Payer: Self-pay | Admitting: Internal Medicine

## 2013-03-29 VITALS — BP 155/79 | HR 90 | Temp 96.8°F | Resp 20 | Ht 62.0 in | Wt 215.6 lb

## 2013-03-29 DIAGNOSIS — D631 Anemia in chronic kidney disease: Secondary | ICD-10-CM

## 2013-03-29 DIAGNOSIS — D472 Monoclonal gammopathy: Secondary | ICD-10-CM

## 2013-03-29 DIAGNOSIS — N189 Chronic kidney disease, unspecified: Secondary | ICD-10-CM

## 2013-03-29 DIAGNOSIS — N183 Chronic kidney disease, stage 3 unspecified: Secondary | ICD-10-CM

## 2013-03-29 LAB — CBC WITH DIFFERENTIAL/PLATELET
BASO%: 0.9 % (ref 0.0–2.0)
Basophils Absolute: 0.1 10*3/uL (ref 0.0–0.1)
EOS%: 1.9 % (ref 0.0–7.0)
HCT: 32.7 % — ABNORMAL LOW (ref 34.8–46.6)
HGB: 10.8 g/dL — ABNORMAL LOW (ref 11.6–15.9)
MCH: 28.3 pg (ref 25.1–34.0)
MCHC: 33.1 g/dL (ref 31.5–36.0)
MCV: 85.3 fL (ref 79.5–101.0)
MONO#: 1 10*3/uL — ABNORMAL HIGH (ref 0.1–0.9)
MONO%: 13.4 % (ref 0.0–14.0)
NEUT#: 3.8 10*3/uL (ref 1.5–6.5)
NEUT%: 53 % (ref 38.4–76.8)
RBC: 3.83 10*6/uL (ref 3.70–5.45)
WBC: 7.1 10*3/uL (ref 3.9–10.3)
lymph#: 2.2 10*3/uL (ref 0.9–3.3)

## 2013-03-29 LAB — COMPREHENSIVE METABOLIC PANEL (CC13)
ALT: 21 U/L (ref 0–55)
AST: 27 U/L (ref 5–34)
Alkaline Phosphatase: 104 U/L (ref 40–150)
BUN: 30.2 mg/dL — ABNORMAL HIGH (ref 7.0–26.0)
CO2: 26 mEq/L (ref 22–29)
Creatinine: 1.7 mg/dL — ABNORMAL HIGH (ref 0.6–1.1)
Total Bilirubin: 0.36 mg/dL (ref 0.20–1.20)

## 2013-03-29 LAB — IRON AND TIBC CHCC
%SAT: 21 % (ref 21–57)
Iron: 66 ug/dL (ref 41–142)
TIBC: 315 ug/dL (ref 236–444)
UIBC: 249 ug/dL (ref 120–384)

## 2013-03-29 LAB — IGG, IGA, IGM
IgA: 297 mg/dL (ref 69–380)
IgG (Immunoglobin G), Serum: 1950 mg/dL — ABNORMAL HIGH (ref 690–1700)

## 2013-03-29 MED ORDER — DARBEPOETIN ALFA-POLYSORBATE 200 MCG/0.4ML IJ SOLN
200.0000 ug | Freq: Once | INTRAMUSCULAR | Status: AC
  Administered 2013-03-29: 200 ug via SUBCUTANEOUS
  Filled 2013-03-29: qty 0.4

## 2013-03-29 NOTE — Patient Instructions (Addendum)
Kidney Disease, Adult  The kidneys are two organs that lie on either side of the spine between the middle of the back and the front of the abdomen. The kidneys:    Remove wastes and extra water from the blood.    Produce important hormones. These regulate blood pressure, help keep bones strong, and help create red blood cells.    Balance the fluids and chemicals in the blood and tissues.  Kidney disease occurs when the kidneys are damaged. Kidney damage may be sudden (acute) or develop over a long period (chronic). A small amount of damage may not cause problems, but a large amount of damage may make it difficult or impossible for the kidneys to work the way they should. Early detection and treatment of kidney disease may prevent kidney damage from becoming permanent or getting worse. Some kidney diseases are curable, but most are not. Many people with kidney disease are able to control the disease and live a normal life.   TYPES OF KIDNEY DISEASE   Acute kidney injury.Acute kidney injury occurs when there is sudden damage to the kidneys.   Chronic kidney disease. Chronic kidney disease occurs when the kidneys are damaged over a long period.   End-stage kidney disease. End-stage kidney disease occurs when the kidneys are so damaged that they stop working. In end-stage kidney disease, the kidneys cannot get better.  CAUSES  Any condition, disease, or event that damages the kidneys may cause kidney disease.  Acute kidney injury.   A problem with blood flow to the kidneys. This may be caused by:    Blood loss.    Heart disease.    Severe burns.    Liver disease.   Direct damage to the kidneys. This may be caused by:   Some medicines.    A kidney infection.    Poisoning or consuming toxic substances.    A surgical wound.    A blow to the kidney area.    A problem with urine flow. This may be caused by:    Cancer.    Kidney stones.    An enlarged prostate.  Chronic kidney disease. The  most common causes of chronic kidney disease are diabetes and high blood pressure (hypertension). Chronic kidney disease may also be caused by:    Diseases that cause the filtering units of the kidneys to become inflamed.    Diseases that affect the immune system.    Genetic diseases.    Medicines that damage the kidneys, such as anti-inflammatory medicines.   Poisoning or exposure to toxic substances.    A reoccurring kidney or urinary infection.    A problem with urine flow. This may be caused by:   Cancer.    Kidney stones.    An enlarged prostate in males.  End-stage kidney disease. This kidney disease usually occurs when a chronic kidney disease gets worse. It may also occur after acute kidney injury.   SYMPTOMS    Swelling (edema) of the legs, ankles, or feet.    Tiredness (lethargy).    Nausea or vomiting.    Confusion.    Problems with urination, such as:    Painful or burning feeling during urination.    Decreased urine production.   Bloody urine.    Frequent urination, especially at night.   Hypertension.   Muscle twitches and cramps.    Shortness of breath.    Persistent itchiness.    Loss of appetite.   Metallic taste in the   disease may be detected and diagnosed by tests, including blood, urine, imaging, or kidney biopsy tests.  TREATMENT  Acute kidney injury. Treatment of acute kidney injury varies depending on the cause and severity of the kidney damage. In mild cases, no treatment may be needed. The kidneys may heal on their own. If acute kidney injury is more severe, your caregiver will treat the cause of the kidney damage, help the kidneys heal, and  prevent complications from occurring. Severe cases may require a procedure to remove toxic wastes from the body (dialysis) or surgery to repair kidney damage. Surgery may involve:   Repair of a torn kidney.   Removal of an obstruction.  Most of the time, you will need to stay overnight at the hospital.  Chronic kidney disease. Most chronic kidney diseases cannot be cured. Treatment usually involves relieving symptoms and preventing or slowing the progression of the disease. Treatment may include:   A special diet. You may need to avoid alcohol and foods that:   Have added salt.   Are high in potassium.   Are high in protein.   Medicines. These may:   Lower blood pressure.   Relieve anemia.   Relieve swelling.   Protect the bones.  End-stage kidney disease. End-stage kidney disease is life-threatening and must be treated immediately. There are two treatments for end-stage kidney disease:   Dialysis.   Receiving a new kidney (kidney transplant). Both of these treatments have serious risks and consequences. In addition to having dialysis or a kidney transplant, you may need to take medicines to control hypertension and cholesterol and to decrease phosphorus levels in your blood. LENGTH OF ILLNESS  Acute kidney injury.The length of this disease varies greatly from person to person. Exactly how long it lasts depends on the cause of the kidney damage. Acute kidney injury may develop into chronic kidney disease or end-stage kidney disease.  Chronic kidney disease. This disease usually lasts a lifetime. Chronic kidney disease may worsen over time to become end-stage kidney disease. The time it takes for end-stage kidney disease to develop varies from person to person.  End-stage kidney disease. This disease lasts until a kidney transplant is performed. PREVENTION  Kidney disease can sometimes be prevented. If you have diabetes, hypertension, or any other condition that may  lead to kidney disease, you should try to prevent kidney disease with:   An appropriate diet.  Medicine.  Lifestyle changes. FOR MORE INFORMATION  American Association of Kidney Patients: ResidentialShow.is  National Kidney Foundation: www.kidney.org  American Kidney Fund: FightingMatch.com.ee  [facility]: www.lifeoptions.org and www.kidneyschool.org  Document Released: 06/20/2005 Document Revised: 06/06/2012 Document Reviewed: 02/17/2012 [facility] Patient Information 2014 [place], [place]. Iron Deficiency Anemia There are many types of anemia. Iron deficiency anemia is the most common. Iron deficiency anemia is a decrease in the number of red blood cells caused by too little iron. Without enough iron, your body does not produce enough hemoglobin. Hemoglobin is a substance in red blood cells that carries oxygen to the body's tissues. Iron deficiency anemia may leave you tired and short of breath. CAUSES   Lack of iron in the diet.  This may be seen in infants and children, because there is little iron in milk.  This may be seen in adults who do not eat enough iron-rich foods.  This may be seen in pregnant or breastfeeding women who do not take iron supplements. There is a much higher need for iron intake at these times.  Poor  absorption of iron, as seen with intestinal disorders.  Intestinal bleeding.  Heavy periods. SYMPTOMS  Mild anemia may not be noticeable. Symptoms may include:  Fatigue.  Headache.  Pale skin.  Weakness.  Shortness of breath.  Dizziness.  Cold hands and feet.  Fast or irregular heartbeat. DIAGNOSIS  Diagnosis requires a thorough evaluation and physical exam by your caregiver.  Blood tests are generally used to confirm iron deficiency anemia.  Additional tests may be done to find the underlying cause of your anemia. These may include:  Testing for blood in the stool (fecal occult blood test).  A procedure to see inside the  colon and rectum (colonoscopy).  A procedure to see inside the esophagus and stomach (endoscopy). TREATMENT   Correcting the cause of the iron deficiency is the first step.  Medicines, such as oral contraceptives, can make heavy menstrual flows lighter.  Antibiotics and other medicines can be used to treat peptic ulcers.  Surgery may be needed to remove a bleeding polyp, tumor, or fibroid.  Often, iron supplements (ferrous sulfate) are taken.  For the best iron absorption, take these supplements with an empty stomach.  You may need to take the supplements with food if you cannot tolerate them on an empty stomach. Vitamin C improves the absorption of iron. Your caregiver may recommend taking your iron tablets with a glass of orange juice or vitamin C supplement.  Milk and antacids should not be taken at the same time as iron supplements. They may interfere with the absorption of iron.  Iron supplements can cause constipation. A stool softener is often recommended.  Pregnant and breastfeeding women will need to take extra iron, because their normal diet usually will not provide the required amount.  Patients who cannot tolerate iron by mouth can take it through a vein (intravenously) or by an injection into the muscle. HOME CARE INSTRUCTIONS   Ask your dietitian for help with diet questions.  Take iron and vitamins as directed by your caregiver.  Eat a diet rich in iron. Eat liver, lean beef, whole-grain bread, eggs, dried fruit, and dark green leafy vegetables. SEEK IMMEDIATE MEDICAL CARE IF:   You have a fainting episode. Do not drive yourself. Call your local emergency services (911 in [place] if no other help is available.  You have chest pain, nausea, or vomiting.  You develop severe or increased shortness of breath with activities.  You develop weakness or increased thirst.  You have a rapid heartbeat.  You develop unexplained sweating or become lightheaded when getting  up from a chair or bed. MAKE SURE YOU:   Understand these instructions.  Will watch your condition.  Will get help right away if you are not doing well or get worse. Document Released: 06/17/2000 Document Revised: 09/12/2011 Document Reviewed: 10/27/2009 [facility] Patient Information 2014 [place], [place]. Anemia, Frequently Asked Questions WHAT ARE THE SYMPTOMS OF ANEMIA?  Headache.  Difficulty thinking.  Fatigue.  Shortness of breath.  Weakness.  Rapid heartbeat. AT WHAT POINT ARE PEOPLE CONSIDERED ANEMIC?  This varies with gender and age.   Both hemoglobin (Hgb) and hematocrit values are used to define anemia. These lab values are obtained from a complete blood count (CBC) test. This is performed at a caregiver's office.  The normal range of hemoglobin values for adult men is 14.0 g/dL to 40.9 g/dL. For nonpregnant women, values are 12.3 g/dL to 81.1 g/dL.  The World Health Organization defines anemia as less than 12 g/dL for nonpregnant women and  less than 13 g/dL for men.  For adult males, the average normal hematocrit is 46%, and the range is 40% to 52%.  For adult females, the average normal hematocrit is 41%, and the range is 35% to 47%.  Values that fall below the lower limits can be a sign of anemia and should have further checking (evaluation). GROUPS OF PEOPLE WHO ARE AT RISK FOR DEVELOPING ANEMIA INCLUDE:   Infants who are breastfed or taking a formula that is not fortified with iron.  Children going through a rapid growth spurt. The iron available can not keep up with the needs for a red cell mass which must grow with the child.  Women in childbearing years. They need iron because of blood loss during menstruation.  Pregnant women. The growing fetus creates a high demand for iron.  People with ongoing gastrointestinal blood loss are at risk of developing iron deficiency.  Individuals with leukemia or cancer who must receive chemotherapy or radiation to  treat their disease. The drugs or radiation used to treat these diseases often decreases the bone marrow's ability to make cells of all classes. This includes red blood cells, Buck blood cells, and platelets.  Individuals with chronic inflammatory conditions such as rheumatoid arthritis or chronic infections.  The elderly. ARE SOME TYPES OF ANEMIA INHERITED?   Yes, some types of anemia are due to inherited or genetic defects.  Sickle cell anemia. This occurs most often in people of African, African American, and Mediterranean descent.  Thalassemia (or Cooley's anemia). This type is found in people of Mediterranean and Southeast Asian descent. These types of anemia are common.  [place]. This is rare. CAN CERTAIN MEDICATIONS CAUSE A PERSON TO BECOME ANEMIC?  Yes. For example, drugs to fight cancer (chemotherapeutic agents) often cause anemia. These drugs can slow the bone marrow's ability to make red blood cells. If there are not enough red blood cells, the body does not get enough oxygen. WHAT HEMATOCRIT LEVEL IS REQUIRED TO DONATE BLOOD?  The lower limit of an acceptable hematocrit for blood donors is 38%. If you have a low hematocrit value, you should schedule an appointment with your caregiver. ARE BLOOD TRANSFUSIONS COMMONLY USED TO CORRECT ANEMIA, AND ARE THEY DANGEROUS?  They are used to treat anemia as a last resort. Your caregiver will find the cause of the anemia and correct it if possible. Most blood transfusions are given because of excessive bleeding at the time of surgery, with trauma, or because of bone marrow suppression in patients with cancer or leukemia on chemotherapy. Blood transfusions are safer than ever before. We also know that blood transfusions affect the immune system and may increase certain risks. There is also a concern for human error. In 1/16,000 transfusions, a patient receives a transfusion of blood that is not matched with his or her blood type.  WHAT IS IRON  DEFICIENCY ANEMIA AND CAN I CORRECT IT BY CHANGING MY DIET?  Iron is an essential part of hemoglobin. Without enough hemoglobin, anemia develops and the body does not get the right amount of oxygen. Iron deficiency anemia develops after the body has had a low level of iron for a long time. This is either caused by blood loss, not taking in or absorbing enough iron, or increased demands for iron (like pregnancy or rapid growth).  Foods from animal origin such as beef, chicken, and pork, are good sources of iron. Be sure to have one of these foods at each meal. Vitamin C helps  your body absorb iron. Foods rich in Vitamin C include citrus, bell pepper, strawberries, spinach and cantaloupe. In some cases, iron supplements may be needed in order to correct the iron deficiency. In the case of poor absorption, extra iron may have to be given directly into the vein through a needle (intravenously). I HAVE BEEN DIAGNOSED WITH IRON DEFICIENCY ANEMIA AND MY CAREGIVER PRESCRIBED IRON SUPPLEMENTS. HOW LONG WILL IT TAKE FOR MY BLOOD TO BECOME NORMAL?  It depends on the degree of anemia at the beginning of treatment. Most people with mild to moderate iron deficiency, anemia will correct the anemia over a period of 2 to 3 months. But after the anemia is corrected, the iron stored by the body is still low. Caregivers often suggest an additional 6 months of oral iron therapy once the anemia has been reversed. This will help prevent the iron deficiency anemia from quickly happening again. Non-anemic adult males should take iron supplements only under the direction of a doctor, too much iron can cause liver damage.  MY HEMOGLOBIN IS 9 G/DL AND I AM SCHEDULED FOR SURGERY. SHOULD I POSTPONE THE SURGERY?  If you have Hgb of 9, you should discuss this with your caregiver right away. Many patients with similar hemoglobin levels have had surgery without problems. If minimal blood loss is expected for a minor procedure, no treatment may  be necessary.  If a greater blood loss is expected for more extensive procedures, you should ask your caregiver about being treated with erythropoietin and iron. This is to accelerate the recovery of your hemoglobin to a normal level before surgery. An anemic patient who undergoes high-blood-loss surgery has a greater risk of surgical complications and need for a blood transfusion, which also carries some risk.  I HAVE BEEN TOLD THAT HEAVY MENSTRUAL PERIODS CAUSE ANEMIA. IS THERE ANYTHING I CAN DO TO PREVENT THE ANEMIA?  Anemia that results from heavy periods is usually due to iron deficiency. You can try to meet the increased demands for iron caused by the heavy monthly blood loss by increasing the intake of iron-rich foods. Iron supplements may be required. Discuss your concerns with your caregiver. WHAT CAUSES ANEMIA DURING PREGNANCY?  Pregnancy places major demands on the body. The mother must meet the needs of both her body and her growing baby. The body needs enough iron and folate to make the right amount of red blood cells. To prevent anemia while pregnant, the mother should stay in close contact with her caregiver.  Be sure to eat a diet that has foods rich in iron and folate like liver and dark green leafy vegetables. Folate plays an important role in the normal development of a baby's spinal cord. Folate can help prevent serious disorders like spina bifida. If your diet does not provide adequate nutrients, you may want to talk with your caregiver about nutritional supplements.  WHAT IS THE RELATIONSHIP BETWEEN FIBROID TUMORS AND ANEMIA IN WOMEN?  The relationship is usually caused by the increased menstrual blood loss caused by fibroids. Good iron intake may be required to prevent iron deficiency anemia from developing.  Document Released: 01/27/2004 Document Revised: 09/12/2011 Document Reviewed: 07/13/2010 [facility] Patient Information 2014 [place], [place]. Darbepoetin Alfa injection What is  this medicine? DARBEPOETIN ALFA (dar be POE e tin AL fa) helps your body make more red blood cells. It is used to treat anemia caused by chronic kidney failure and chemotherapy. This medicine may be used for other purposes; ask your health care provider or  pharmacist if you have questions. What should I tell my health care provider before I take this medicine? They need to know if you have any of these conditions: -blood clotting disorders or history of blood clots -cancer patient not on chemotherapy -cystic fibrosis -heart disease, such as angina, heart failure, or a history of a heart attack -hemoglobin level of 12 g/dL or greater -high blood pressure -low levels of folate, iron, or vitamin B12 -seizures -an unusual or allergic reaction to darbepoetin, erythropoietin, albumin, hamster proteins, latex, other medicines, foods, dyes, or preservatives -pregnant or trying to get pregnant -breast-feeding How should I use this medicine? This medicine is for injection into a vein or under the skin. It is usually given by a health care professional in a hospital or clinic setting. If you get this medicine at home, you will be taught how to prepare and give this medicine. Do not shake the solution before you withdraw a dose. Use exactly as directed. Take your medicine at regular intervals. Do not take your medicine more often than directed. It is important that you put your used needles and syringes in a special sharps container. Do not put them in a trash can. If you do not have a sharps container, call your pharmacist or healthcare provider to get one. Talk to your pediatrician regarding the use of this medicine in children. While this medicine may be used in children as young as 1 year for selected conditions, precautions do apply. Overdosage: If you think you have taken too much of this medicine contact a poison control center or emergency room at once. NOTE: This medicine is only for you. Do not  share this medicine with others. What if I miss a dose? If you miss a dose, take it as soon as you can. If it is almost time for your next dose, take only that dose. Do not take double or extra doses. What may interact with this medicine? Do not take this medicine with any of the following medications: -epoetin alfa This list may not describe all possible interactions. Give your health care provider a list of all the medicines, herbs, non-prescription drugs, or dietary supplements you use. Also tell them if you smoke, drink alcohol, or use illegal drugs. Some items may interact with your medicine. What should I watch for while using this medicine? Visit your prescriber or health care professional for regular checks on your progress and for the needed blood tests and blood pressure measurements. It is especially important for the doctor to make sure your hemoglobin level is in the desired range, to limit the risk of potential side effects and to give you the best benefit. Keep all appointments for any recommended tests. Check your blood pressure as directed. Ask your doctor what your blood pressure should be and when you should contact him or her. As your body makes more red blood cells, you may need to take iron, folic acid, or vitamin B supplements. Ask your doctor or health care provider which products are right for you. If you have kidney disease continue dietary restrictions, even though this medication can make you feel better. Talk with your doctor or health care professional about the foods you eat and the vitamins that you take. What side effects may I notice from receiving this medicine? Side effects that you should report to your doctor or health care professional as soon as possible: -allergic reactions like skin rash, itching or hives, swelling of the face, lips, or tongue -  breathing problems -changes in vision -chest pain -confusion, trouble speaking or understanding -feeling faint or  lightheaded, falls -high blood pressure -muscle aches or pains -pain, swelling, warmth in the leg -rapid weight gain -severe headaches -sudden numbness or weakness of the face, arm or leg -trouble walking, dizziness, loss of balance or coordination -seizures (convulsions) -swelling of the ankles, feet, hands -unusually weak or tired Side effects that usually do not require medical attention (report to your doctor or health care professional if they continue or are bothersome): -diarrhea -fever, chills (flu-like symptoms) -headaches -nausea, vomiting -redness, stinging, or swelling at site where injected This list may not describe all possible side effects. Call your doctor for medical advice about side effects. You may report side effects to FDA at 1-800-FDA-1088. Where should I keep my medicine? Keep out of the reach of children. Store in a refrigerator between 2 and 8 degrees C (36 and 46 degrees F). Do not freeze. Do not shake. Throw away any unused portion if using a single-dose vial. Throw away any unused medicine after the expiration date. NOTE: This sheet is a summary. It may not cover all possible information. If you have questions about this medicine, talk to your doctor, pharmacist, or health care provider.  2013, [employer] Standard. (06/03/2008 10:23:57 AM)

## 2013-03-29 NOTE — Telephone Encounter (Signed)
Gave pt appt for lab and MD for March  2015 °

## 2013-03-30 ENCOUNTER — Encounter: Payer: Self-pay | Admitting: Internal Medicine

## 2013-03-30 NOTE — Progress Notes (Signed)
[facility] OFFICE PROGRESS NOTE  [name], MD [address] [facility], [place]. [place] [address]  DIAGNOSIS: MGUS (monoclonal gammopathy of unknown significance) - Plan: CBC with Differential, Comprehensive metabolic panel, IgG, IgA, IgM  CKD (chronic kidney disease) stage 3, GFR 30-59 ml/min - Plan: CBC with Differential, Comprehensive metabolic panel, Iron and TIBC, Ferritin  Anemia associated with chronic renal failure - Plan: ARANESP TREATMENT CONDITION, SCHEDULING COMMUNICATION INJECTION, CBC with Differential, darbepoetin (ARANESP) injection 200 mcg, SCHEDULING COMMUNICATION LAB  Chief Complaint  Patient presents with  . Anemia associated with chronic renal failure  . IgG lambda MGUS    CURRENT THERAPY:  Aranesp 200 mcg subcutaneous prn for a hemoglobin less than 11.   INTERVAL HISTORY: [name] 79 y.o. female with a history of IgG lambda MGUS, Anemia associated with chronic renal failure who presents for follow up.   She was last seen by Dr. Arline Asp on 09/27/2012.  She reports that she saw her primary care physician (Dr Timothy Lasso) 3 weeks ago and she is scheduled to se her cardiologist next week.  She reports feeling fine overall.  She denies any recent hospitalizations or emergency room visits.  She has received her flu vaccine. Her last feraheme 510 mg was received in August 10, 2012.  She continues aranesp 200 mcg subcutaneous  Every month for a hemogloblin less than 11.  She denies fevers of chills or acute shortness of breath.  She also denies chest pain or dyspnea on exertion.   MEDICAL HISTORY: Past Medical History  Diagnosis Date  . Coronary artery disease     a. Severe two vessel; s/p CABG (LIMA-LAD, SVG-diag, SVG-PDA);  b. 05/2012 NSTEMI in setting of rapid aflutter;  c. 06/2012 Lexiscan cardiolite: small, mild reversible defect in lateral wall->mild ischemia, low-risk->med Rx.  Marland Kitchen Hypertension   . Type II diabetes mellitus    . Hyperlipidemia   . Diabetic neuropathy   . CVA (cerebral vascular accident) 2004  . Hypothyroidism   . Chronic anemia   . GERD (gastroesophageal reflux disease)   . Arthritis   . Memory loss, short term   . COPD (chronic obstructive pulmonary disease)   . Blood transfusion without reported diagnosis   . Chronic combined systolic and diastolic CHF (congestive heart failure)     a. 07/2012 Echo: EF 30%, Gr II DD, Mild AS/MR, Mod-Sev TR, mild bi-atrial and RV dil, PASP .  . Moderate mitral regurgitation     a. mod by TEE 2012, mild by echo 2013, 07/2012  . Severe tricuspid regurgitation     a. Severe by TEE 2012, mod by echo 2013, mod-sev by echo 07/2012  . Chronic vulvitis 07/1993  . Idiopathic anemia 2006  . Atrial fibrillation     a. Failed amiodarone (prolonged QT), not a candidate for class IC meds due to her CAD. No Multaq due to CHF hx;  b. 07/2012 s/p SJM Anthem RF Bi-V PPM, ser # [id number] and AV node RFCA;  c. chronic coumadin.  . Atrial flutter   . Pacemaker   . GI bleed     a. 10/2011 - Dr Elnoria Howard did scope which showed hiatal hernia, colon revealed diverticula (diverticular bleed), internal/ext hemorrhoids.    INTERIM HISTORY: has Atrial fibrillation; Hypothyroidism; Chronic anticoagulation; CAD (coronary artery disease); Diastolic dysfunction; Anemia associated with chronic renal failure; Atrial tachycardia; Right heart failure; MGUS (monoclonal gammopathy of unknown significance); GI bleed; CKD (chronic kidney disease) stage 3, GFR 30-59 ml/min; Abscess of  right thigh s/p I&D 05/07/2012; Chronic systolic CHF (congestive heart failure); Atrial flutter; Long term (current) use of anticoagulants; Acute on chronic combined systolic and diastolic congestive heart failure, NYHA class 4; Diabetes mellitus; Chronic anemia; Cardiomyopathy-cause unknown ? rate related v ischemic; Acute respiratory failure; Pulmonary edema; Pleural effusion; Atrial fibrillation with RVR; Physical  deconditioning; Fever; Pneumonia; Weakness; Bacteremia due to Escherichia coli; Coronary artery disease; Hx of CABG; Chronic combined systolic and diastolic congestive heart failure; Diabetes mellitus type 2 with complications; Acute on chronic renal failure; Pacemaker; DOE (dyspnea on exertion); Benign neoplasm of stomach; Adenomatous duodenal polyp; and Diverticulosis of colon (without mention of hemorrhage) on her problem list.    ALLERGIES:  is allergic to betadine; capoten; codone; methadone; penicillins; and propoxyphene and methadone.  MEDICATIONS: has a current medication list which includes the following prescription(s): amitriptyline, atorvastatin, diltiazem, donepezil, esomeprazole, ezetimibe, furosemide, insulin glargine, insulin lispro, levothyroxine, metoprolol succinate, multiple vitamins-minerals, nitroglycerin, potassium chloride sa, and warfarin.  SURGICAL HISTORY:  Past Surgical History  Procedure Laterality Date  . Coronary artery bypass graft  06/2009    X3, LIMA to LAD, SVG to diagonal, SVG to the posterior descending artery.  . Tonsillectomy    . Knee arthroscopy  08/2001  . Transthoracic echocardiogram  11/12/2010     Ejection fraction felt to be around 50%.  Wall thickness was increased in a pattern of mild LVH.  Moderately dilated left atrium.  Mildly dilated right atrium. Right ventricle was mildly dilated with mildly reduced systolic function  . Cardiac catheterization  06/09/2009    inferior wall hypokinesia with ejection fraction of 55%.  . Abdominal hysterectomy    . Joint replacement    . Tee without cardioversion  05/23/2011    Procedure: TRANSESOPHAGEAL ECHOCARDIOGRAM (TEE);  Surgeon: Lewayne Bunting, MD;  Location: [facility] ENDOSCOPY;  Service: Cardiovascular;  Laterality: N/A;  . Incision and drainage abscess  05/07/2012    Procedure: INCISION AND DRAINAGE ABSCESS;  Surgeon: Ardeth Sportsman, MD;  Location: [facility] OR;  Service: General;  Laterality: N/A;  Groin wound  .  Total abdominal hysterectomy  1979  . Breast biopsy  Left  . Bilateral salpingectomy  10/2000  . Breast biopsy  09/18/2009    fibrocystic change  . Coronary artery bypass graft  06/2009  . Pacemaker insertion  08/2012  . Colonoscopy N/A 01/19/2013    Procedure: COLONOSCOPY;  Surgeon: Louis Meckel, MD;  Location: [facility] ENDOSCOPY;  Service: Endoscopy;  Laterality: N/A;  . Esophagogastroduodenoscopy N/A 01/19/2013    Procedure: ESOPHAGOGASTRODUODENOSCOPY (EGD);  Surgeon: Louis Meckel, MD;  Location: [facility] ENDOSCOPY;  Service: Endoscopy;  Laterality: N/A;    REVIEW OF SYSTEMS:   Constitutional: Denies fevers, chills or abnormal weight loss Eyes: Denies blurriness of vision Ears, nose, mouth, throat, and face: Denies mucositis or sore throat Respiratory: Denies cough, dyspnea or wheezes Cardiovascular: Denies palpitation, chest discomfort or lower extremity swelling Gastrointestinal:  Denies nausea, heartburn or change in bowel habits Skin: Denies abnormal skin rashes Lymphatics: Denies new lymphadenopathy or easy bruising Neurological:Denies numbness, tingling or new weaknesses Behavioral/Psych: Mood is stable, no new changes  All other systems were reviewed with the patient and are negative.  PHYSICAL EXAMINATION: ECOG PERFORMANCE STATUS: 0 - Asymptomatic  Blood pressure 155/79, pulse 90, temperature 96.8 F (36 C), resp. rate 20, height 5\' 2"  (1.575 m), weight 215 lb 9.6 oz (97.796 kg).  GENERAL:alert, no distress and comfortable, mildly obese SKIN: skin color, texture, turgor are normal, no rashes or significant lesions  EYES: normal, Conjunctiva are pink and non-injected, sclera clear OROPHARYNX:no exudate, no erythema and lips, buccal mucosa, and tongue normal  NECK: supple, thyroid normal size, non-tender, without nodularity LYMPH:  no palpable lymphadenopathy in the cervical, axillary or supraclavicular LUNGS: clear to auscultation and percussion with normal breathing  effort HEART: Irregular Irregular with pacemaker in the left infraclavicular area.  Chronic lower extremity edema wtth stasis changes.  ABDOMEN:abdomen soft, non-tender and normal bowel sounds Musculoskeletal:no cyanosis of digits and no clubbing  NEURO: alert & oriented x 3 with fluent speech, no focal motor/sensory deficits  LABORATORY DATA: Results for orders placed in visit on 03/29/13 (from the past 48 hour(s))  CBC WITH DIFFERENTIAL     Status: Abnormal   Collection Time    03/29/13  9:06 AM      Result Value Range   WBC 7.1  3.9 - 10.3 10e3/uL   NEUT# 3.8  1.5 - 6.5 10e3/uL   HGB 10.8 (*) 11.6 - 15.9 g/dL   HCT 16.1 (*) 09.6 - 04.5 %   Platelets 238  145 - 400 10e3/uL   MCV 85.3  79.5 - 101.0 fL   MCH 28.3  25.1 - 34.0 pg   MCHC 33.1  31.5 - 36.0 g/dL   RBC 4.09  8.11 - 9.14 10e6/uL   RDW 15.9 (*) 11.2 - 14.5 %   lymph# 2.2  0.9 - 3.3 10e3/uL   MONO# 1.0 (*) 0.1 - 0.9 10e3/uL   Eosinophils Absolute 0.1  0.0 - 0.5 10e3/uL   Basophils Absolute 0.1  0.0 - 0.1 10e3/uL   NEUT% 53.0  38.4 - 76.8 %   LYMPH% 30.8  14.0 - 49.7 %   MONO% 13.4  0.0 - 14.0 %   EOS% 1.9  0.0 - 7.0 %   BASO% 0.9  0.0 - 2.0 %  FERRITIN [facility]     Status: None   Collection Time    03/29/13  9:06 AM      Result Value Range   Ferritin 27  9 - 269 ng/ml  IRON AND TIBC [facility]     Status: None   Collection Time    03/29/13  9:06 AM      Result Value Range   Iron 66  41 - 142 ug/dL   TIBC 782  956 - 213 ug/dL   UIBC 086  578 - 469 ug/dL   %SAT 21  21 - 57 %  COMPREHENSIVE METABOLIC PANEL (CC13)     Status: Abnormal   Collection Time    03/29/13  9:07 AM      Result Value Range   Sodium 139  136 - 145 mEq/L   Potassium 4.0  3.5 - 5.1 mEq/L   Chloride 104  98 - 109 mEq/L   CO2 26  22 - 29 mEq/L   Glucose 250 (*) 70 - 140 mg/dl   BUN 62.9 (*) 7.0 - 52.8 mg/dL   Creatinine 1.7 (*) 0.6 - 1.1 mg/dL   Total Bilirubin 4.13  0.20 - 1.20 mg/dL   Alkaline Phosphatase 104  40 - 150 U/L   AST 27  5 - 34 U/L    ALT 21  0 - 55 U/L   Total Protein 8.1  6.4 - 8.3 g/dL   Albumin 3.0 (*) 3.5 - 5.0 g/dL   Calcium 9.1  8.4 - 24.4 mg/dL  IGG, IGA, IGM     Status: Abnormal   Collection Time    03/29/13  9:07 AM  Result Value Range   IgG (Immunoglobin G), Serum 1950 (*) 690 - 1700 mg/dL   IgA 409  69 - 811 mg/dL   IgM, Serum 914  52 - 322 mg/dL  LACTATE DEHYDROGENASE (CC13)     Status: None   Collection Time    03/29/13  9:07 AM      Result Value Range   LDH 239  125 - 245 U/L      RADIOGRAPHIC STUDIES: No results found.  ASSESSMENT/PLAN: [name] 79 y.o. female with a history of MGUS (monoclonal gammopathy of unknown significance) - Plan: CBC with Differential, Comprehensive metabolic panel, IgG, IgA, IgM  CKD (chronic kidney disease) stage 3, GFR 30-59 ml/min - Plan: CBC with Differential, Comprehensive metabolic panel, Iron and TIBC, Ferritin  Anemia associated with chronic renal failure - Plan: ARANESP TREATMENT CONDITION, SCHEDULING COMMUNICATION INJECTION, CBC with Differential, darbepoetin (ARANESP) injection 200 mcg, SCHEDULING COMMUNICATION LAB  Today, her hemoglobin is 10.8 so she will receive her aranesp 200 mcg subcutaneous shot.   She will continue to have CBCs every 4 weeks and have aranesp as indicated.  Her ferritin is low at 27 today.  We will schedule a 510 mg feraheme infusion for her with her next monthly CBC and repeat her ferritin on her next visit.  Her history is also notable for angiodyplasia and diverticulosis resulting in heme positive stool.   Her IgG levels remains stable. She denies any symptoms including bone pain.  All questions were answered. The patient knows to call the clinic with any problems, questions or concerns. We can certainly Alexander the patient much sooner if necessary.  She was provided an after visit summary and handouts on anemia of chronic disease and kidney disease.   I spent 15 minutes counseling the patient face to face. The total time  spent in the appointment was 30 minutes.    Ibrohim Simmers, MD 03/30/2013 8:17 PM

## 2013-04-02 ENCOUNTER — Telehealth: Payer: Self-pay | Admitting: Internal Medicine

## 2013-04-02 NOTE — Telephone Encounter (Signed)
lmonvm for pt re add on appt for 10/1 @ 2:45pm. Other appts remain the same and pt asked to call back to r/s if she cannot keep appt d/t.

## 2013-04-03 ENCOUNTER — Other Ambulatory Visit: Payer: Self-pay | Admitting: Medical Oncology

## 2013-04-03 ENCOUNTER — Ambulatory Visit (HOSPITAL_BASED_OUTPATIENT_CLINIC_OR_DEPARTMENT_OTHER): Payer: Medicare Other

## 2013-04-03 ENCOUNTER — Other Ambulatory Visit: Payer: Self-pay | Admitting: Internal Medicine

## 2013-04-03 VITALS — BP 119/70 | HR 91 | Temp 97.7°F | Resp 18

## 2013-04-03 DIAGNOSIS — D472 Monoclonal gammopathy: Secondary | ICD-10-CM

## 2013-04-03 DIAGNOSIS — D631 Anemia in chronic kidney disease: Secondary | ICD-10-CM

## 2013-04-03 DIAGNOSIS — N189 Chronic kidney disease, unspecified: Secondary | ICD-10-CM

## 2013-04-03 DIAGNOSIS — N179 Acute kidney failure, unspecified: Secondary | ICD-10-CM

## 2013-04-03 DIAGNOSIS — D649 Anemia, unspecified: Secondary | ICD-10-CM

## 2013-04-03 MED ORDER — FERUMOXYTOL INJECTION 510 MG/17 ML
510.0000 mg | Freq: Once | INTRAVENOUS | Status: AC
  Administered 2013-04-03: 510 mg via INTRAVENOUS
  Filled 2013-04-03: qty 17

## 2013-04-03 MED ORDER — SODIUM CHLORIDE 0.9 % IJ SOLN
10.0000 mL | Freq: Once | INTRAMUSCULAR | Status: DC
  Filled 2013-04-03: qty 10

## 2013-04-03 MED ORDER — SODIUM CHLORIDE 0.9 % IV SOLN
Freq: Once | INTRAVENOUS | Status: AC
  Administered 2013-04-03: 16:00:00 via INTRAVENOUS

## 2013-04-03 NOTE — Patient Instructions (Signed)
Ferumoxytol injection What is this medicine? FERUMOXYTOL is an iron complex. Iron is used to make healthy red blood cells, which carry oxygen and nutrients throughout the body. This medicine is used to treat iron deficiency anemia in people with chronic kidney disease. This medicine may be used for other purposes; ask your health care provider or pharmacist if you have questions. What should I tell my health care provider before I take this medicine? They need to know if you have any of these conditions: -anemia not caused by low iron levels -high levels of iron in the blood -magnetic resonance imaging (MRI) test scheduled -an unusual or allergic reaction to iron, other medicines, foods, dyes, or preservatives -pregnant or trying to get pregnant -breast-feeding How should I use this medicine? This medicine is for infusion into a vein. It is given by a health care professional in a hospital or clinic setting. Talk to your pediatrician regarding the use of this medicine in children. Special care may be needed. Overdosage: If you think you've taken too much of this medicine contact a poison control center or emergency room at once. Overdosage: If you think you have taken too much of this medicine contact a poison control center or emergency room at once. NOTE: This medicine is only for you. Do not share this medicine with others. What if I miss a dose? It is important not to miss your dose. Call your doctor or health care professional if you are unable to keep an appointment. What may interact with this medicine? This medicine may interact with the following medications: -other iron products This list may not describe all possible interactions. Give your health care provider a list of all the medicines, herbs, non-prescription drugs, or dietary supplements you use. Also tell them if you smoke, drink alcohol, or use illegal drugs. Some items may interact with your medicine. What should I watch  for while using this medicine? Visit your doctor or healthcare professional regularly. Tell your doctor or healthcare professional if your symptoms do not start to get better or if they get worse. You may need blood work done while you are taking this medicine. You may need to follow a special diet. Talk to your doctor. Foods that contain iron include: whole grains/cereals, dried fruits, beans, or peas, leafy green vegetables, and organ meats (liver, kidney). What side effects may I notice from receiving this medicine? Side effects that you should report to your doctor or health care professional as soon as possible: -allergic reactions like skin rash, itching or hives, swelling of the face, lips, or tongue -breathing problems -changes in blood pressure -feeling faint or lightheaded, falls -fever or chills -flushing, sweating, or hot feelings -swelling of the ankles or feet Side effects that usually do not require medical attention (Report these to your doctor or health care professional if they continue or are bothersome.): -diarrhea -headache -nausea, vomiting -stomach pain This list may not describe all possible side effects. Call your doctor for medical advice about side effects. You may report side effects to FDA at 1-800-FDA-1088. Where should I keep my medicine? This drug is given in a hospital or clinic and will not be stored at home. NOTE: This sheet is a summary. It may not cover all possible information. If you have questions about this medicine, talk to your doctor, pharmacist, or health care provider.  2013, [employer]. (03/12/2008 9:48:25 PM)  

## 2013-04-09 ENCOUNTER — Other Ambulatory Visit: Payer: Self-pay | Admitting: *Deleted

## 2013-04-09 ENCOUNTER — Telehealth: Payer: Self-pay | Admitting: Internal Medicine

## 2013-04-09 DIAGNOSIS — I509 Heart failure, unspecified: Secondary | ICD-10-CM

## 2013-04-09 DIAGNOSIS — I4891 Unspecified atrial fibrillation: Secondary | ICD-10-CM

## 2013-04-09 NOTE — Telephone Encounter (Signed)
error 

## 2013-04-10 ENCOUNTER — Encounter: Payer: Self-pay | Admitting: Cardiology

## 2013-04-10 ENCOUNTER — Ambulatory Visit (INDEPENDENT_AMBULATORY_CARE_PROVIDER_SITE_OTHER): Payer: Medicare Other | Admitting: Cardiology

## 2013-04-10 ENCOUNTER — Ambulatory Visit (INDEPENDENT_AMBULATORY_CARE_PROVIDER_SITE_OTHER): Payer: Medicare Other | Admitting: *Deleted

## 2013-04-10 VITALS — BP 138/68 | HR 90 | Ht 62.0 in | Wt 217.0 lb

## 2013-04-10 DIAGNOSIS — Z7901 Long term (current) use of anticoagulants: Secondary | ICD-10-CM

## 2013-04-10 DIAGNOSIS — I509 Heart failure, unspecified: Secondary | ICD-10-CM

## 2013-04-10 DIAGNOSIS — Z951 Presence of aortocoronary bypass graft: Secondary | ICD-10-CM

## 2013-04-10 DIAGNOSIS — I5042 Chronic combined systolic (congestive) and diastolic (congestive) heart failure: Secondary | ICD-10-CM

## 2013-04-10 DIAGNOSIS — Z950 Presence of cardiac pacemaker: Secondary | ICD-10-CM

## 2013-04-10 DIAGNOSIS — I4891 Unspecified atrial fibrillation: Secondary | ICD-10-CM

## 2013-04-10 DIAGNOSIS — I2510 Atherosclerotic heart disease of native coronary artery without angina pectoris: Secondary | ICD-10-CM

## 2013-04-10 LAB — POCT INR: INR: 1.7

## 2013-04-10 NOTE — Progress Notes (Signed)
[name] Date of Birth: 06/03/1933 Medical Record #[id number]  History of Present Illness: [name] is seen back today for a follow up visit.  She has had atrial fib and CHF. EF was down to 30%. Not a candidate for antiarrhythmics due to her multiple comorbidities/intolerances. A BiV pacemaker was implanted followed by AV nodal ablation in December 2013.  Echo was repeated in February 2014 and had improved with an EF up to 45 to 50%. She continues to do very well since her AV node ablation. Her edema has been stable. She denies any increase in dyspnea or chest pain. She has received periodic iron infusions for chronic anemia and also receives Aranesp injections.   Current Outpatient Prescriptions on File Prior to Visit  Medication Sig Dispense Refill  . amitriptyline (ELAVIL) 25 MG tablet Take 75 mg by mouth at bedtime.        Marland Kitchen atorvastatin (LIPITOR) 40 MG tablet Take 40 mg by mouth every evening.       . diltiazem (CARDIZEM CD) 240 MG 24 hr capsule Take 1 capsule (240 mg total) by mouth daily.  30 capsule  11  . donepezil (ARICEPT) 5 MG tablet Take 5 mg by mouth at bedtime.      Marland Kitchen esomeprazole (NEXIUM) 40 MG capsule Take 40 mg by mouth daily before breakfast.        . ezetimibe (ZETIA) 10 MG tablet Take 10 mg by mouth every evening.       . furosemide (LASIX) 40 MG tablet Take 60 mg by mouth every morning.      . insulin glargine (LANTUS) 100 UNIT/ML injection Inject 35 Units into the skin 2 (two) times daily.      . insulin lispro (HUMALOG) 100 UNIT/ML injection Inject 0-20 Units into the skin 2 (two) times daily as needed for high blood sugar. Home sliding scale If blood sugar is <200 take 15 units If blood sugar is >200 take 20 units If blood sugar is less than 70, don't take any      . levothyroxine (SYNTHROID, LEVOTHROID) 100 MCG tablet Take 100 mcg by mouth every morning.       . metoprolol succinate (TOPROL-XL) 50 MG 24 hr tablet Take 50 mg by mouth every morning. Take with  or immediately following a meal.      . Multiple Vitamins-Minerals (EYE VITAMINS PO) Take 1 tablet by mouth 2 (two) times daily.       . nitroGLYCERIN (NITROSTAT) 0.4 MG SL tablet Place 0.4 mg under the tongue every 5 (five) minutes as needed. For chest pain      . potassium chloride SA (K-DUR,KLOR-CON) 20 MEQ tablet Take 10 mEq by mouth every morning.       . warfarin (COUMADIN) 5 MG tablet Take 7.5-10 mg by mouth every evening. Wednesday and Saturday take 2 tablets. Sunday, Monday, Tuesday, Thursday and Friday take 1.5 tabs.      . [DISCONTINUED] enoxaparin (LOVENOX) 100 MG/ML SOLN Inject into the skin every 12 (twelve) hours.        . [DISCONTINUED] simvastatin (ZOCOR) 80 MG tablet Take 80 mg by mouth at bedtime.         No current facility-administered medications on file prior to visit.    Allergies  Allergen Reactions  . Betadine [Povidone Iodine] Itching  . Capoten [Captopril] Other (Alexander Comments)    unknown  . Codone [Hydrocodone] Nausea And Vomiting  . Methadone Nausea And Vomiting  . Penicillins Swelling  .  Propoxyphene And Methadone     Intolerance to darvocet    Past Medical History  Diagnosis Date  . Coronary artery disease     a. Severe two vessel; s/p CABG (LIMA-LAD, SVG-diag, SVG-PDA);  b. 05/2012 NSTEMI in setting of rapid aflutter;  c. 06/2012 Lexiscan cardiolite: small, mild reversible defect in lateral wall->mild ischemia, low-risk->med Rx.  Marland Kitchen Hypertension   . Type II diabetes mellitus   . Hyperlipidemia   . Diabetic neuropathy   . CVA (cerebral vascular accident) 2004  . Hypothyroidism   . Chronic anemia   . GERD (gastroesophageal reflux disease)   . Arthritis   . Memory loss, short term   . COPD (chronic obstructive pulmonary disease)   . Blood transfusion without reported diagnosis   . Chronic combined systolic and diastolic CHF (congestive heart failure)     a. 07/2012 Echo: EF 30%, Gr II DD, Mild AS/MR, Mod-Sev TR, mild bi-atrial and RV dil, PASP  .  . Moderate mitral regurgitation     a. mod by TEE 2012, mild by echo 2013, 07/2012  . Severe tricuspid regurgitation     a. Severe by TEE 2012, mod by echo 2013, mod-sev by echo 07/2012  . Chronic vulvitis 07/1993  . Idiopathic anemia 2006  . Atrial fibrillation     a. Failed amiodarone (prolonged QT), not a candidate for class IC meds due to her CAD. No Multaq due to CHF hx;  b. 07/2012 s/p SJM Anthem RF Bi-V PPM, ser # [id number] and AV node RFCA;  c. chronic coumadin.  . Atrial flutter   . Pacemaker   . GI bleed     a. 10/2011 - Dr Elnoria Howard did scope which showed hiatal hernia, colon revealed diverticula (diverticular bleed), internal/ext hemorrhoids.    Past Surgical History  Procedure Laterality Date  . Coronary artery bypass graft  06/2009    X3, LIMA to LAD, SVG to diagonal, SVG to the posterior descending artery.  . Tonsillectomy    . Knee arthroscopy  08/2001  . Transthoracic echocardiogram  11/12/2010     Ejection fraction felt to be around 50%.  Wall thickness was increased in a pattern of mild LVH.  Moderately dilated left atrium.  Mildly dilated right atrium. Right ventricle was mildly dilated with mildly reduced systolic function  . Cardiac catheterization  06/09/2009    inferior wall hypokinesia with ejection fraction of 55%.  . Abdominal hysterectomy    . Joint replacement    . Tee without cardioversion  05/23/2011    Procedure: TRANSESOPHAGEAL ECHOCARDIOGRAM (TEE);  Surgeon: Lewayne Bunting, MD;  Location: [facility] ENDOSCOPY;  Service: Cardiovascular;  Laterality: N/A;  . Incision and drainage abscess  05/07/2012    Procedure: INCISION AND DRAINAGE ABSCESS;  Surgeon: Ardeth Sportsman, MD;  Location: [facility] OR;  Service: General;  Laterality: N/A;  Groin wound  . Total abdominal hysterectomy  1979  . Breast biopsy  Left  . Bilateral salpingectomy  10/2000  . Breast biopsy  09/18/2009    fibrocystic change  . Coronary artery bypass graft  06/2009  . Pacemaker insertion  08/2012  .  Colonoscopy N/A 01/19/2013    Procedure: COLONOSCOPY;  Surgeon: Louis Meckel, MD;  Location: [facility] ENDOSCOPY;  Service: Endoscopy;  Laterality: N/A;  . Esophagogastroduodenoscopy N/A 01/19/2013    Procedure: ESOPHAGOGASTRODUODENOSCOPY (EGD);  Surgeon: Louis Meckel, MD;  Location: [facility] ENDOSCOPY;  Service: Endoscopy;  Laterality: N/A;    History  Smoking status  . Former Smoker  .  Types: Cigarettes  Smokeless tobacco  . Never Used    History  Alcohol Use No    Family History  Problem Relation Age of Onset  . Colon cancer Mother   . Stroke Mother   . Hypertension Mother   . Cancer Mother     breast  . Cancer Father     colon  . Heart attack Father     x2  . Renal Disease Sister     dialysis-twin sister  . Renal Disease Sister     renal bypass    Review of Systems: The review of systems is per the HPI.   All other systems were reviewed and are negative.  Physical Exam: BP 138/68  Pulse 90  Ht 5\' 2"  (1.575 m)  Wt 217 lb (98.431 kg)  BMI 39.68 kg/m2 Patient is  pleasant and in no acute distress. She has memory loss. Skin is warm and dry. Color is normal.  HEENT is unremarkable. Normocephalic/atraumatic. PERRL. Sclera are nonicteric. Neck is supple. No masses. No JVD. Lungs are clear. Cardiac exam shows a regular rate and rhythm. There is a grade 2/6 systolic murmur heard at the left sternal border.. Abdomen is soft. Extremities are quite full and with 1+ brawny edema. Gait and ROM are intact. No gross neurologic deficits noted.  LABORATORY DATA:  Lab Results  Component Value Date   WBC 7.1 03/29/2013   HGB 10.8* 03/29/2013   HCT 32.7* 03/29/2013   PLT 238 03/29/2013   GLUCOSE 250* 03/29/2013   CHOL 160 05/26/2012   TRIG 80 05/26/2012   HDL 29* 05/26/2012   LDLCALC 115* 05/26/2012   ALT 21 03/29/2013   AST 27 03/29/2013   NA 139 03/29/2013   K 4.0 03/29/2013   CL 100 01/20/2013   CREATININE 1.7* 03/29/2013   BUN 30.2* 03/29/2013   CO2 26 03/29/2013   TSH 1.257 01/18/2013    INR 1.7 04/10/2013   HGBA1C 8.0* 01/16/2013    Assessment / Plan:  1. Chronic systolic and diastolic heart failure -volume status is stable. I stressed the importance of sodium restriction. We'll continue her current Lasix dose 60 mg per day. She is scheduled for followup echocardiogram in December.  2. S/p BiV pacemaker implant with AV node ablation -continue followup in the device clinic.  3. Atrial fib - not a candidate for antiarrhythmic therapy - now s/p AV node ablation. Clinically she has had a significant improvement since her AV node ablation.  4. Chronic coumadin therapy. INR was 1.7 today and her dose was adjusted.  5. Memory loss.  6. CAD status post CABG. No anginal symptoms.  7. Anemia with heme positive stool. I recommended stopping her aspirin at this point. Hemoglobin is close to her baseline now.

## 2013-04-10 NOTE — Patient Instructions (Signed)
Continue your current therapy  Restrict your salt intake  I will see you in 6 months.   

## 2013-04-14 IMAGING — CR DG CHEST 1V PORT
1 series · 1 of 1 positions shown · non-contrast
Comparison: 05/04/2012

CLINICAL DATA: Tachycardia

PORTABLE CHEST - 1 VIEW

[AP]
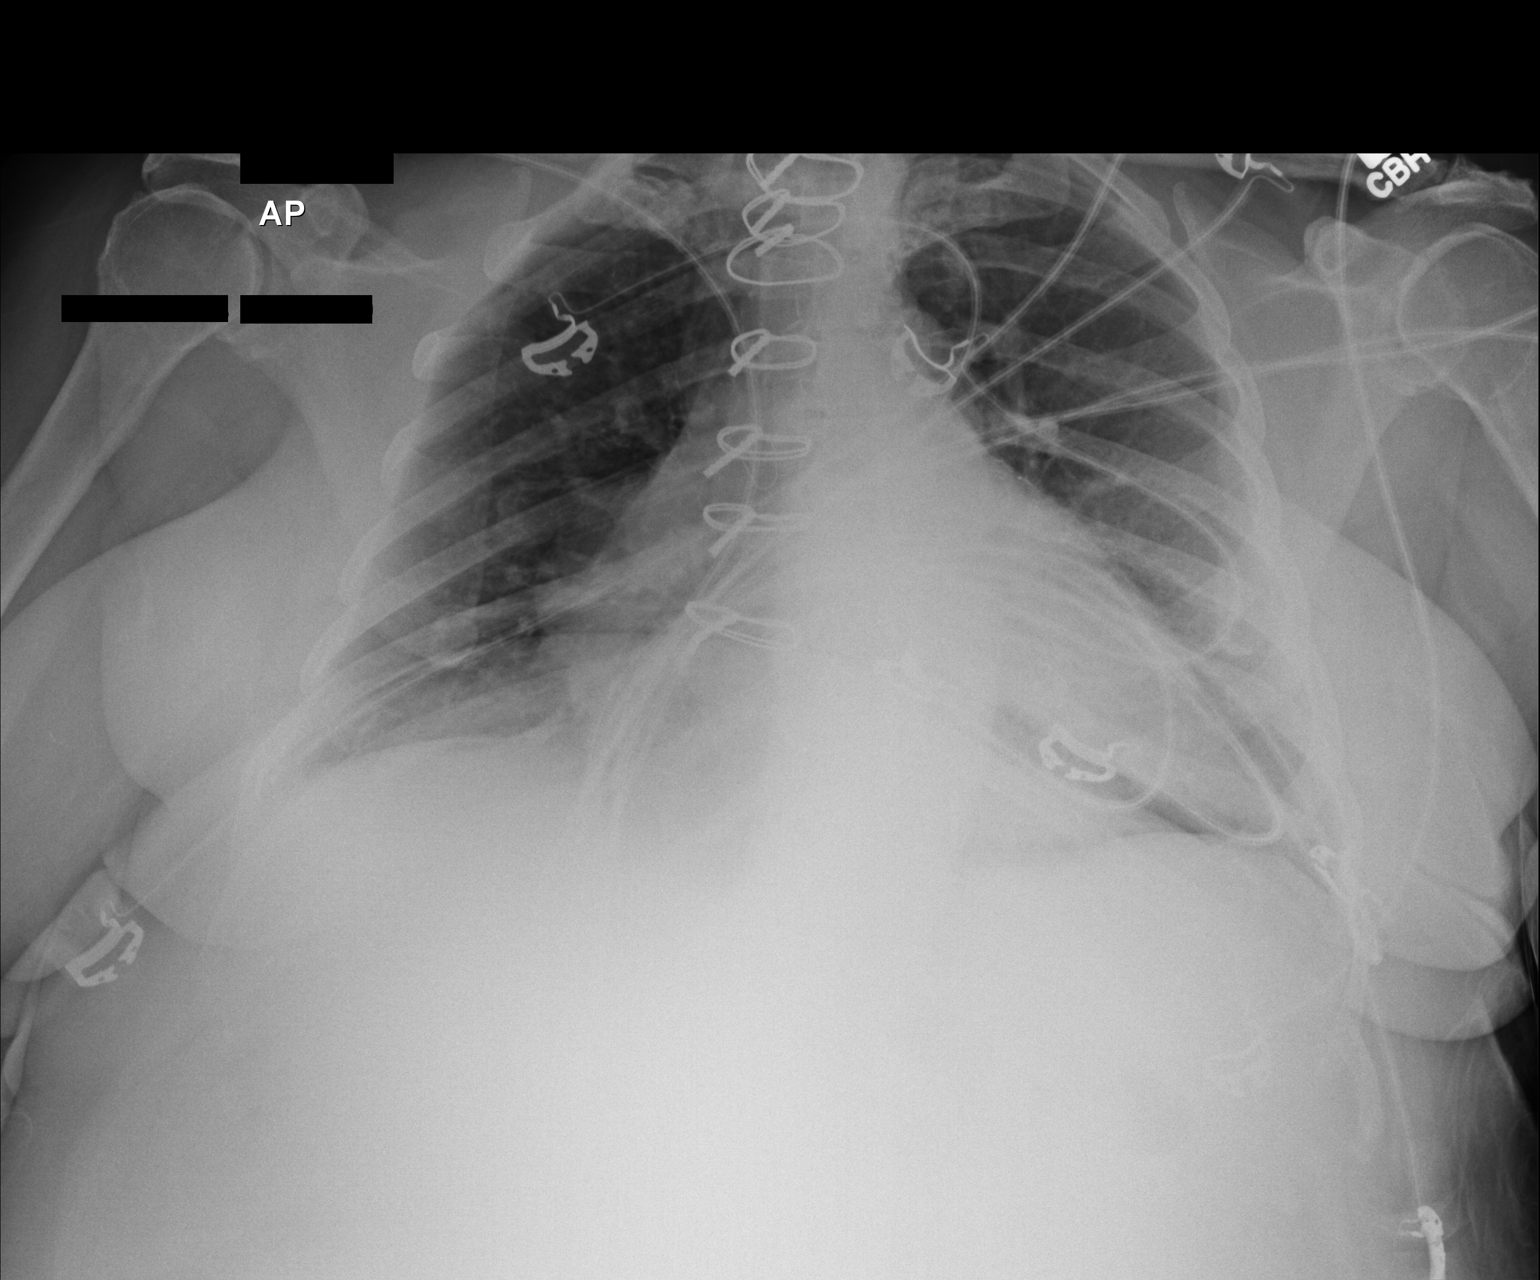

[1 of 1 positions shown; findings below may reference images not displayed]

FINDINGS: Previous median sternotomy.  Stable cardiomegaly.  Low
lung volumes with crowding of perihilar and bibasilar
bronchovascular structures.  No confluent airspace infiltrate.  No
effusion.
IMPRESSION: Stable cardiomegaly

## 2013-04-19 ENCOUNTER — Other Ambulatory Visit: Payer: Self-pay

## 2013-04-19 MED ORDER — FUROSEMIDE 40 MG PO TABS: 60.0000 mg | ORAL_TABLET | Freq: Every morning | ORAL | Status: DC

## 2013-04-22 ENCOUNTER — Ambulatory Visit (INDEPENDENT_AMBULATORY_CARE_PROVIDER_SITE_OTHER): Payer: Medicare Other | Admitting: General Practice

## 2013-04-22 DIAGNOSIS — Z7901 Long term (current) use of anticoagulants: Secondary | ICD-10-CM

## 2013-04-22 DIAGNOSIS — I4891 Unspecified atrial fibrillation: Secondary | ICD-10-CM

## 2013-04-22 LAB — POCT INR: INR: 2.2

## 2013-05-03 ENCOUNTER — Ambulatory Visit: Payer: Medicare Other | Admitting: Cardiology

## 2013-05-06 ENCOUNTER — Ambulatory Visit (INDEPENDENT_AMBULATORY_CARE_PROVIDER_SITE_OTHER): Payer: Medicare Other | Admitting: *Deleted

## 2013-05-06 DIAGNOSIS — I4891 Unspecified atrial fibrillation: Secondary | ICD-10-CM

## 2013-05-06 DIAGNOSIS — Z7901 Long term (current) use of anticoagulants: Secondary | ICD-10-CM

## 2013-05-06 LAB — POCT INR: INR: 2.2

## 2013-05-09 ENCOUNTER — Other Ambulatory Visit: Payer: Self-pay

## 2013-05-10 ENCOUNTER — Encounter: Payer: Self-pay | Admitting: Cardiology

## 2013-05-21 ENCOUNTER — Telehealth: Payer: Self-pay | Admitting: Internal Medicine

## 2013-05-21 ENCOUNTER — Other Ambulatory Visit: Payer: Self-pay | Admitting: Internal Medicine

## 2013-05-21 DIAGNOSIS — D649 Anemia, unspecified: Secondary | ICD-10-CM

## 2013-05-21 NOTE — Telephone Encounter (Signed)
Patient will receive iron infusion today.  She is aware.   Follow up later this week with me.

## 2013-05-21 NOTE — Telephone Encounter (Signed)
s.w. pt and advised on 11.20.14 appts...pt ok and aware

## 2013-05-23 ENCOUNTER — Encounter: Payer: Self-pay | Admitting: Internal Medicine

## 2013-05-23 ENCOUNTER — Ambulatory Visit (HOSPITAL_BASED_OUTPATIENT_CLINIC_OR_DEPARTMENT_OTHER): Payer: Medicare Other

## 2013-05-23 ENCOUNTER — Ambulatory Visit (HOSPITAL_BASED_OUTPATIENT_CLINIC_OR_DEPARTMENT_OTHER): Payer: Medicare Other | Admitting: Internal Medicine

## 2013-05-23 ENCOUNTER — Telehealth: Payer: Self-pay | Admitting: Internal Medicine

## 2013-05-23 ENCOUNTER — Other Ambulatory Visit (HOSPITAL_BASED_OUTPATIENT_CLINIC_OR_DEPARTMENT_OTHER): Payer: Medicare Other | Admitting: Lab

## 2013-05-23 VITALS — BP 181/75 | HR 90 | Temp 97.9°F | Resp 18 | Ht 62.0 in | Wt 218.5 lb

## 2013-05-23 DIAGNOSIS — D472 Monoclonal gammopathy: Secondary | ICD-10-CM

## 2013-05-23 DIAGNOSIS — D631 Anemia in chronic kidney disease: Secondary | ICD-10-CM

## 2013-05-23 DIAGNOSIS — N179 Acute kidney failure, unspecified: Secondary | ICD-10-CM

## 2013-05-23 DIAGNOSIS — D649 Anemia, unspecified: Secondary | ICD-10-CM

## 2013-05-23 DIAGNOSIS — N189 Chronic kidney disease, unspecified: Secondary | ICD-10-CM

## 2013-05-23 LAB — COMPREHENSIVE METABOLIC PANEL (CC13)
ALT: 17 U/L (ref 0–55)
AST: 27 U/L (ref 5–34)
Albumin: 3 g/dL — ABNORMAL LOW (ref 3.5–5.0)
BUN: 21.2 mg/dL (ref 7.0–26.0)
Calcium: 9.4 mg/dL (ref 8.4–10.4)
Chloride: 105 mEq/L (ref 98–109)
Glucose: 118 mg/dl (ref 70–140)
Potassium: 4.2 mEq/L (ref 3.5–5.1)
Sodium: 140 mEq/L (ref 136–145)
Total Protein: 7.9 g/dL (ref 6.4–8.3)

## 2013-05-23 LAB — CBC WITH DIFFERENTIAL/PLATELET
BASO%: 1.1 % (ref 0.0–2.0)
Basophils Absolute: 0.1 10*3/uL (ref 0.0–0.1)
EOS%: 1.5 % (ref 0.0–7.0)
HGB: 10.4 g/dL — ABNORMAL LOW (ref 11.6–15.9)
LYMPH%: 20.1 % (ref 14.0–49.7)
MCH: 28.3 pg (ref 25.1–34.0)
MCV: 87.7 fL (ref 79.5–101.0)
RBC: 3.69 10*6/uL — ABNORMAL LOW (ref 3.70–5.45)
RDW: 17.8 % — ABNORMAL HIGH (ref 11.2–14.5)
lymph#: 1.9 10*3/uL (ref 0.9–3.3)

## 2013-05-23 MED ORDER — SODIUM CHLORIDE 0.9 % IV SOLN
1020.0000 mg | Freq: Once | INTRAVENOUS | Status: AC
  Administered 2013-05-23: 1020 mg via INTRAVENOUS
  Filled 2013-05-23: qty 34

## 2013-05-23 MED ORDER — DARBEPOETIN ALFA-POLYSORBATE 200 MCG/0.4ML IJ SOLN
200.0000 ug | Freq: Once | INTRAMUSCULAR | Status: AC
  Administered 2013-05-23: 200 ug via SUBCUTANEOUS
  Filled 2013-05-23: qty 0.4

## 2013-05-23 MED ORDER — SODIUM CHLORIDE 0.9 % IV SOLN
Freq: Once | INTRAVENOUS | Status: AC
  Administered 2013-05-23: 15:00:00 via INTRAVENOUS

## 2013-05-23 NOTE — Patient Instructions (Signed)
Ferumoxytol injection (Feraheme) What is this medicine? FERUMOXYTOL is an iron complex. Iron is used to make healthy red blood cells, which carry oxygen and nutrients throughout the body. This medicine is used to treat iron deficiency anemia in people with chronic kidney disease. This medicine may be used for other purposes; ask your health care provider or pharmacist if you have questions. COMMON BRAND NAME(S): Feraheme  What should I tell my health care provider before I take this medicine? They need to know if you have any of these conditions: -anemia not caused by low iron levels -high levels of iron in the blood -magnetic resonance imaging (MRI) test scheduled -an unusual or allergic reaction to iron, other medicines, foods, dyes, or preservatives -pregnant or trying to get pregnant -breast-feeding How should I use this medicine? This medicine is for injection into a vein. It is given by a health care professional in a hospital or clinic setting. Talk to your pediatrician regarding the use of this medicine in children. Special care may be needed. Overdosage: If you think you've taken too much of this medicine contact a poison control center or emergency room at once. Overdosage: If you think you have taken too much of this medicine contact a poison control center or emergency room at once. NOTE: This medicine is only for you. Do not share this medicine with others. What if I miss a dose? It is important not to miss your dose. Call your doctor or health care professional if you are unable to keep an appointment. What may interact with this medicine? This medicine may interact with the following medications: -other iron products This list may not describe all possible interactions. Give your health care provider a list of all the medicines, herbs, non-prescription drugs, or dietary supplements you use. Also tell them if you smoke, drink alcohol, or use illegal drugs. Some items may  interact with your medicine. What should I watch for while using this medicine? Visit your doctor or healthcare professional regularly. Tell your doctor or healthcare professional if your symptoms do not start to get better or if they get worse. You may need blood work done while you are taking this medicine. You may need to follow a special diet. Talk to your doctor. Foods that contain iron include: whole grains/cereals, dried fruits, beans, or peas, leafy green vegetables, and organ meats (liver, kidney). What side effects may I notice from receiving this medicine? Side effects that you should report to your doctor or health care professional as soon as possible: -allergic reactions like skin rash, itching or hives, swelling of the face, lips, or tongue -breathing problems -changes in blood pressure -feeling faint or lightheaded, falls -fever or chills -flushing, sweating, or hot feelings -swelling of the ankles or feet Side effects that usually do not require medical attention (Report these to your doctor or health care professional if they continue or are bothersome.): -diarrhea -headache -nausea, vomiting -stomach pain This list may not describe all possible side effects. Call your doctor for medical advice about side effects. You may report side effects to FDA at 1-800-FDA-1088. Where should I keep my medicine? This drug is given in a hospital or clinic and will not be stored at home. NOTE: This sheet is a summary. It may not cover all possible information. If you have questions about this medicine, talk to your doctor, pharmacist, or health care provider.  2014, Elsevier/Gold Standard. (2012-02-03 15:23:36)  Darbepoetin Alfa injection (Arenesp) What is this medicine? DARBEPOETIN ALFA (dar  be POE e tin AL fa) helps your body make more red blood cells. It is used to treat anemia caused by chronic kidney failure and chemotherapy. This medicine may be used for other purposes; ask your  health care provider or pharmacist if you have questions. COMMON BRAND NAME(S): Aranesp What should I tell my health care provider before I take this medicine? They need to know if you have any of these conditions: -blood clotting disorders or history of blood clots -cancer patient not on chemotherapy -cystic fibrosis -heart disease, such as angina, heart failure, or a history of a heart attack -hemoglobin level of 12 g/dL or greater -high blood pressure -low levels of folate, iron, or vitamin B12 -seizures -an unusual or allergic reaction to darbepoetin, erythropoietin, albumin, hamster proteins, latex, other medicines, foods, dyes, or preservatives -pregnant or trying to get pregnant -breast-feeding How should I use this medicine? This medicine is for injection into a vein or under the skin. It is usually given by a health care professional in a hospital or clinic setting. If you get this medicine at home, you will be taught how to prepare and give this medicine. Do not shake the solution before you withdraw a dose. Use exactly as directed. Take your medicine at regular intervals. Do not take your medicine more often than directed. It is important that you put your used needles and syringes in a special sharps container. Do not put them in a trash can. If you do not have a sharps container, call your pharmacist or healthcare provider to get one. Talk to your pediatrician regarding the use of this medicine in children. While this medicine may be used in children as young as 1 year for selected conditions, precautions do apply. Overdosage: If you think you have taken too much of this medicine contact a poison control center or emergency room at once. NOTE: This medicine is only for you. Do not share this medicine with others. What if I miss a dose? If you miss a dose, take it as soon as you can. If it is almost time for your next dose, take only that dose. Do not take double or extra  doses. What may interact with this medicine? Do not take this medicine with any of the following medications: -epoetin alfa This list may not describe all possible interactions. Give your health care provider a list of all the medicines, herbs, non-prescription drugs, or dietary supplements you use. Also tell them if you smoke, drink alcohol, or use illegal drugs. Some items may interact with your medicine. What should I watch for while using this medicine? Visit your prescriber or health care professional for regular checks on your progress and for the needed blood tests and blood pressure measurements. It is especially important for the doctor to make sure your hemoglobin level is in the desired range, to limit the risk of potential side effects and to give you the best benefit. Keep all appointments for any recommended tests. Check your blood pressure as directed. Ask your doctor what your blood pressure should be and when you should contact him or her. As your body makes more red blood cells, you may need to take iron, folic acid, or vitamin B supplements. Ask your doctor or health care provider which products are right for you. If you have kidney disease continue dietary restrictions, even though this medication can make you feel better. Talk with your doctor or health care professional about the foods you eat and the  vitamins that you take. What side effects may I notice from receiving this medicine? Side effects that you should report to your doctor or health care professional as soon as possible: -allergic reactions like skin rash, itching or hives, swelling of the face, lips, or tongue -breathing problems -changes in vision -chest pain -confusion, trouble speaking or understanding -feeling faint or lightheaded, falls -high blood pressure -muscle aches or pains -pain, swelling, warmth in the leg -rapid weight gain -severe headaches -sudden numbness or weakness of the face, arm or  leg -trouble walking, dizziness, loss of balance or coordination -seizures (convulsions) -swelling of the ankles, feet, hands -unusually weak or tired Side effects that usually do not require medical attention (report to your doctor or health care professional if they continue or are bothersome): -diarrhea -fever, chills (flu-like symptoms) -headaches -nausea, vomiting -redness, stinging, or swelling at site where injected This list may not describe all possible side effects. Call your doctor for medical advice about side effects. You may report side effects to FDA at 1-800-FDA-1088. Where should I keep my medicine? Keep out of the reach of children. Store in a refrigerator between 2 and 8 degrees C (36 and 46 degrees F). Do not freeze. Do not shake. Throw away any unused portion if using a single-dose vial. Throw away any unused medicine after the expiration date. NOTE: This sheet is a summary. It may not cover all possible information. If you have questions about this medicine, talk to your doctor, pharmacist, or health care provider.  2014, [employer]vier/Gold Standard. (2008-06-03 10:23:57)

## 2013-05-23 NOTE — Telephone Encounter (Signed)
Gave pt appt for lab,injections and MD on December 2014

## 2013-05-26 NOTE — Progress Notes (Signed)
[facility] OFFICE PROGRESS NOTE  [name], MD [address] [facility], [place]. [place] [address]  DIAGNOSIS: MGUS (monoclonal gammopathy of unknown significance)  Acute on chronic renal failure - Plan: CBC with Differential, CBC with Differential in 1 month, Comprehensive metabolic panel, Ferritin, Iron and TIBC  Chief Complaint  Patient presents with  . Anemia associated with chronic renal failure    CURRENT THERAPY: Aranesp 200 mcg subcutaneous prn for a hemoglobin less than 11.   INTERVAL HISTORY: [name] 79 y.o. female with a history of IgG lambda MGUS, Anemia associated with chronic renal failure who presents for follow up. She was last seen by me on 03/29/2013. She reports that she saw her primary care physician (Dr Timothy Lasso) a few months ago and she saw her cardiologist one month ago. She reports feeling fine overall. She denies any recent hospitalizations or emergency room visits. She has received her flu vaccine. Her last feraheme 510 mg was received in August 10, 2012. She continues aranesp 200 mcg subcutaneous every month for a hemogloblin less than 11. She denies fevers of chills or acute shortness of breath. She also denies chest pain or dyspnea on exertion.  MEDICAL HISTORY: Past Medical History  Diagnosis Date  . Coronary artery disease     a. Severe two vessel; s/p CABG (LIMA-LAD, SVG-diag, SVG-PDA);  b. 05/2012 NSTEMI in setting of rapid aflutter;  c. 06/2012 Lexiscan cardiolite: small, mild reversible defect in lateral wall->mild ischemia, low-risk->med Rx.  Marland Kitchen Hypertension   . Type II diabetes mellitus   . Hyperlipidemia   . Diabetic neuropathy   . CVA (cerebral vascular accident) 2004  . Hypothyroidism   . Chronic anemia   . GERD (gastroesophageal reflux disease)   . Arthritis   . Memory loss, short term   . COPD (chronic obstructive pulmonary disease)   . Blood transfusion without reported diagnosis   . Chronic  combined systolic and diastolic CHF (congestive heart failure)     a. 07/2012 Echo: EF 30%, Gr II DD, Mild AS/MR, Mod-Sev TR, mild bi-atrial and RV dil, PASP .  . Moderate mitral regurgitation     a. mod by TEE 2012, mild by echo 2013, 07/2012  . Severe tricuspid regurgitation     a. Severe by TEE 2012, mod by echo 2013, mod-sev by echo 07/2012  . Chronic vulvitis 07/1993  . Idiopathic anemia 2006  . Atrial fibrillation     a. Failed amiodarone (prolonged QT), not a candidate for class IC meds due to her CAD. No Multaq due to CHF hx;  b. 07/2012 s/p SJM Anthem RF Bi-V PPM, ser # [id number] and AV node RFCA;  c. chronic coumadin.  . Atrial flutter   . Pacemaker   . GI bleed     a. 10/2011 - Dr Elnoria Howard did scope which showed hiatal hernia, colon revealed diverticula (diverticular bleed), internal/ext hemorrhoids.    INTERIM HISTORY: has Atrial fibrillation; Hypothyroidism; Chronic anticoagulation; CAD (coronary artery disease); Diastolic dysfunction; Anemia associated with chronic renal failure; Atrial tachycardia; Right heart failure; MGUS (monoclonal gammopathy of unknown significance); GI bleed; CKD (chronic kidney disease) stage 3, GFR 30-59 ml/min; Abscess of right thigh s/p I&D 05/07/2012; Chronic systolic CHF (congestive heart failure); Atrial flutter; Long term (current) use of anticoagulants; Acute on chronic combined systolic and diastolic congestive heart failure, NYHA class 4; Diabetes mellitus; Chronic anemia; Cardiomyopathy-cause unknown ? rate related v ischemic; Acute respiratory failure; Pulmonary edema; Pleural effusion; Atrial fibrillation with RVR;  Physical deconditioning; Fever; Pneumonia; Weakness; Bacteremia due to Escherichia coli; Coronary artery disease; Hx of CABG; Chronic combined systolic and diastolic congestive heart failure; Diabetes mellitus type 2 with complications; Acute on chronic renal failure; Pacemaker; DOE (dyspnea on exertion); Benign neoplasm of stomach; Adenomatous  duodenal polyp; and Diverticulosis of colon (without mention of hemorrhage) on her problem list.    ALLERGIES:  is allergic to betadine; capoten; codone; methadone; penicillins; and propoxyphene and methadone.  MEDICATIONS: has a current medication list which includes the following prescription(s): amitriptyline, atorvastatin, diltiazem, donepezil, esomeprazole, ezetimibe, furosemide, insulin glargine, insulin lispro, levothyroxine, metoprolol succinate, multiple vitamins-minerals, nitroglycerin, potassium chloride sa, and warfarin.  SURGICAL HISTORY:  Past Surgical History  Procedure Laterality Date  . Coronary artery bypass graft  06/2009    X3, LIMA to LAD, SVG to diagonal, SVG to the posterior descending artery.  . Tonsillectomy    . Knee arthroscopy  08/2001  . Transthoracic echocardiogram  11/12/2010     Ejection fraction felt to be around 50%.  Wall thickness was increased in a pattern of mild LVH.  Moderately dilated left atrium.  Mildly dilated right atrium. Right ventricle was mildly dilated with mildly reduced systolic function  . Cardiac catheterization  06/09/2009    inferior wall hypokinesia with ejection fraction of 55%.  . Abdominal hysterectomy    . Joint replacement    . Tee without cardioversion  05/23/2011    Procedure: TRANSESOPHAGEAL ECHOCARDIOGRAM (TEE);  Surgeon: Lewayne Bunting, MD;  Location: [facility] ENDOSCOPY;  Service: Cardiovascular;  Laterality: N/A;  . Incision and drainage abscess  05/07/2012    Procedure: INCISION AND DRAINAGE ABSCESS;  Surgeon: Ardeth Sportsman, MD;  Location: [facility] OR;  Service: General;  Laterality: N/A;  Groin wound  . Total abdominal hysterectomy  1979  . Breast biopsy  Left  . Bilateral salpingectomy  10/2000  . Breast biopsy  09/18/2009    fibrocystic change  . Coronary artery bypass graft  06/2009  . Pacemaker insertion  08/2012  . Colonoscopy N/A 01/19/2013    Procedure: COLONOSCOPY;  Surgeon: Louis Meckel, MD;  Location: [facility] ENDOSCOPY;   Service: Endoscopy;  Laterality: N/A;  . Esophagogastroduodenoscopy N/A 01/19/2013    Procedure: ESOPHAGOGASTRODUODENOSCOPY (EGD);  Surgeon: Louis Meckel, MD;  Location: [facility] ENDOSCOPY;  Service: Endoscopy;  Laterality: N/A;    REVIEW OF SYSTEMS:   Constitutional: Denies fevers, chills or abnormal weight loss Eyes: Denies blurriness of vision Ears, nose, mouth, throat, and face: Denies mucositis or sore throat Respiratory: Denies cough, dyspnea or wheezes Cardiovascular: Denies palpitation, chest discomfort or lower extremity swelling Gastrointestinal:  Denies nausea, heartburn or change in bowel habits Skin: Denies abnormal skin rashes Lymphatics: Denies new lymphadenopathy or easy bruising Neurological:Denies numbness, tingling or new weaknesses Behavioral/Psych: Mood is stable, no new changes  All other systems were reviewed with the patient and are negative.  PHYSICAL EXAMINATION: ECOG PERFORMANCE STATUS: 0 - Asymptomatic  Blood pressure 181/75, pulse 90, temperature 97.9 F (36.6 C), temperature source Oral, resp. rate 18, height 5\' 2"  (1.575 m), weight 218 lb 8 oz (99.111 kg), SpO2 98.00%.  GENERAL:alert, no distress and comfortable SKIN: skin color, texture, turgor are normal, no rashes or significant lesions EYES: normal, Conjunctiva are pink and non-injected, sclera clear OROPHARYNX:no exudate, no erythema and lips, buccal mucosa, and tongue normal  NECK: supple, thyroid normal size, non-tender, without nodularity LYMPH:  no palpable lymphadenopathy in the cervical, axillary or supraclavicular LUNGS: clear to auscultation and percussion with normal breathing effort HEART:  Irregular Irregular with pacemaker in the left infraclavicular area. Chronic lower extremity edema wtth stasis changes.  ABDOMEN:abdomen soft, non-tender and normal bowel sounds Musculoskeletal:no cyanosis of digits and no clubbing  NEURO: alert & oriented x 3 with fluent speech, no focal motor/sensory  deficits   Labs:  Lab Results  Component Value Date   WBC 9.4 05/23/2013   HGB 10.4* 05/23/2013   HCT 32.3* 05/23/2013   MCV 87.7 05/23/2013   PLT 270 05/23/2013   NEUTROABS 6.3 05/23/2013      Chemistry      Component Value Date/Time   NA 140 05/23/2013 1300   NA 137 01/20/2013 0443   K 4.2 05/23/2013 1300   K 3.5 01/20/2013 0443   CL 100 01/20/2013 0443   CL 105 08/30/2012 1108   CO2 26 05/23/2013 1300   CO2 26 01/20/2013 0443   BUN 21.2 05/23/2013 1300   BUN 24* 01/20/2013 0443   CREATININE 1.5* 05/23/2013 1300   CREATININE 1.63* 01/20/2013 0443   CREATININE 1.68* 05/16/2011 1352      Component Value Date/Time   CALCIUM 9.4 05/23/2013 1300   CALCIUM 8.9 01/20/2013 0443   ALKPHOS 123 05/23/2013 1300   ALKPHOS 145* 08/07/2012 0445   AST 27 05/23/2013 1300   AST 29 08/07/2012 0445   ALT 17 05/23/2013 1300   ALT 17 08/07/2012 0445   BILITOT 0.35 05/23/2013 1300   BILITOT 0.4 08/07/2012 0445     Basic Metabolic Panel:  Recent Labs Lab 05/23/13 1300  NA 140  K 4.2  CO2 26  GLUCOSE 118  BUN 21.2  CREATININE 1.5*  CALCIUM 9.4   GFR Estimated Creatinine Clearance: 34 ml/min (by C-G formula based on Cr of 1.5). Liver Function Tests:  Recent Labs Lab 05/23/13 1300  AST 27  ALT 17  ALKPHOS 123  BILITOT 0.35  PROT 7.9  ALBUMIN 3.0*   CBC:  Recent Labs Lab 05/23/13 1300  WBC 9.4  NEUTROABS 6.3  HGB 10.4*  HCT 32.3*  MCV 87.7  PLT 270    Recent Labs  05/23/13 1126  FERRITIN 127  TIBC 1235*  IRON 1217*   Studies:  No results found.   RADIOGRAPHIC STUDIES: No results found.  ASSESSMENT: [name] 79 y.o. female with a history of MGUS (monoclonal gammopathy of unknown significance)  Acute on chronic renal failure - Plan: CBC with Differential, CBC with Differential in 1 month, Comprehensive metabolic panel, Ferritin, Iron and TIBC   PLAN:  1. Anemia of chronic renal failure.  --Today, her hemoglobin is 10.4 so she will receive her  aranesp 200 mcg subcutaneous shot. She will continue to have CBCs every 4 weeks and have aranesp as indicated.  Her history is also notable for angiodyplasia and diverticulosis resulting in heme positive stool.   2. MGUS. Her IgG levels remains stable. She denies any symptoms including bone pain  All questions were answered. The patient knows to call the clinic with any problems, questions or concerns. We can certainly see the patient much sooner if necessary.  I spent 15 minutes counseling the patient face to face. The total time spent in the appointment was 25 minutes.    Dian Laprade, MD 05/26/2013 12:18 AM

## 2013-05-27 ENCOUNTER — Other Ambulatory Visit: Payer: Self-pay | Admitting: *Deleted

## 2013-05-27 MED ORDER — WARFARIN SODIUM 5 MG PO TABS: ORAL_TABLET | ORAL | Status: DC

## 2013-06-03 ENCOUNTER — Other Ambulatory Visit: Payer: Self-pay | Admitting: *Deleted

## 2013-06-03 ENCOUNTER — Ambulatory Visit (INDEPENDENT_AMBULATORY_CARE_PROVIDER_SITE_OTHER): Payer: Medicare Other | Admitting: General Practice

## 2013-06-03 DIAGNOSIS — Z7901 Long term (current) use of anticoagulants: Secondary | ICD-10-CM

## 2013-06-03 DIAGNOSIS — I4891 Unspecified atrial fibrillation: Secondary | ICD-10-CM

## 2013-06-03 MED ORDER — METOPROLOL SUCCINATE ER 50 MG PO TB24: 50.0000 mg | ORAL_TABLET | Freq: Every morning | ORAL | Status: DC

## 2013-06-04 ENCOUNTER — Other Ambulatory Visit (HOSPITAL_COMMUNITY): Payer: Self-pay | Admitting: Internal Medicine

## 2013-06-04 ENCOUNTER — Encounter (HOSPITAL_COMMUNITY): Admission: RE | Admit: 2013-06-04 | Payer: Medicare Other | Source: Ambulatory Visit

## 2013-06-06 ENCOUNTER — Other Ambulatory Visit (HOSPITAL_BASED_OUTPATIENT_CLINIC_OR_DEPARTMENT_OTHER): Payer: Medicare Other | Admitting: Lab

## 2013-06-06 ENCOUNTER — Ambulatory Visit (HOSPITAL_BASED_OUTPATIENT_CLINIC_OR_DEPARTMENT_OTHER): Payer: Medicare Other

## 2013-06-06 VITALS — BP 145/68 | HR 91 | Temp 98.4°F

## 2013-06-06 DIAGNOSIS — N179 Acute kidney failure, unspecified: Secondary | ICD-10-CM

## 2013-06-06 DIAGNOSIS — D631 Anemia in chronic kidney disease: Secondary | ICD-10-CM

## 2013-06-06 DIAGNOSIS — N189 Chronic kidney disease, unspecified: Secondary | ICD-10-CM

## 2013-06-06 DIAGNOSIS — D472 Monoclonal gammopathy: Secondary | ICD-10-CM

## 2013-06-06 LAB — CBC WITH DIFFERENTIAL/PLATELET
BASO%: 0.4 % (ref 0.0–2.0)
Basophils Absolute: 0 10*3/uL (ref 0.0–0.1)
EOS%: 2.1 % (ref 0.0–7.0)
HCT: 33.6 % — ABNORMAL LOW (ref 34.8–46.6)
LYMPH%: 25.8 % (ref 14.0–49.7)
MCH: 29.2 pg (ref 25.1–34.0)
MCHC: 31.8 g/dL (ref 31.5–36.0)
MCV: 91.6 fL (ref 79.5–101.0)
MONO%: 14.5 % — ABNORMAL HIGH (ref 0.0–14.0)
NEUT#: 4.1 10*3/uL (ref 1.5–6.5)
NEUT%: 57.2 % (ref 38.4–76.8)
Platelets: 167 10*3/uL (ref 145–400)
RBC: 3.67 10*6/uL — ABNORMAL LOW (ref 3.70–5.45)

## 2013-06-06 MED ORDER — DARBEPOETIN ALFA-POLYSORBATE 200 MCG/0.4ML IJ SOLN
200.0000 ug | Freq: Once | INTRAMUSCULAR | Status: AC
  Administered 2013-06-06: 200 ug via SUBCUTANEOUS
  Filled 2013-06-06: qty 0.4

## 2013-06-11 ENCOUNTER — Encounter: Payer: Self-pay | Admitting: Internal Medicine

## 2013-06-11 ENCOUNTER — Ambulatory Visit (HOSPITAL_COMMUNITY): Payer: Medicare Other | Attending: Cardiology | Admitting: Radiology

## 2013-06-11 ENCOUNTER — Ambulatory Visit (INDEPENDENT_AMBULATORY_CARE_PROVIDER_SITE_OTHER): Payer: Medicare Other | Admitting: Internal Medicine

## 2013-06-11 ENCOUNTER — Encounter: Payer: Self-pay | Admitting: Cardiology

## 2013-06-11 VITALS — BP 142/78 | HR 91 | Ht 63.0 in | Wt 221.0 lb

## 2013-06-11 DIAGNOSIS — I079 Rheumatic tricuspid valve disease, unspecified: Secondary | ICD-10-CM | POA: Insufficient documentation

## 2013-06-11 DIAGNOSIS — E669 Obesity, unspecified: Secondary | ICD-10-CM | POA: Insufficient documentation

## 2013-06-11 DIAGNOSIS — R0602 Shortness of breath: Secondary | ICD-10-CM

## 2013-06-11 DIAGNOSIS — I359 Nonrheumatic aortic valve disorder, unspecified: Secondary | ICD-10-CM | POA: Insufficient documentation

## 2013-06-11 DIAGNOSIS — I5042 Chronic combined systolic (congestive) and diastolic (congestive) heart failure: Secondary | ICD-10-CM

## 2013-06-11 DIAGNOSIS — I5043 Acute on chronic combined systolic (congestive) and diastolic (congestive) heart failure: Secondary | ICD-10-CM

## 2013-06-11 DIAGNOSIS — I4891 Unspecified atrial fibrillation: Secondary | ICD-10-CM

## 2013-06-11 DIAGNOSIS — I4892 Unspecified atrial flutter: Secondary | ICD-10-CM

## 2013-06-11 DIAGNOSIS — Z950 Presence of cardiac pacemaker: Secondary | ICD-10-CM

## 2013-06-11 DIAGNOSIS — I509 Heart failure, unspecified: Secondary | ICD-10-CM

## 2013-06-11 DIAGNOSIS — I059 Rheumatic mitral valve disease, unspecified: Secondary | ICD-10-CM | POA: Insufficient documentation

## 2013-06-11 DIAGNOSIS — Z6841 Body Mass Index (BMI) 40.0 and over, adult: Secondary | ICD-10-CM | POA: Insufficient documentation

## 2013-06-11 LAB — MDC_IDC_ENUM_SESS_TYPE_INCLINIC
Battery Remaining Longevity: 24 mo
Battery Voltage: 2.9 V
Brady Statistic RV Percent Paced: 99.67 %
Date Time Interrogation Session: 20141209153718
Implantable Pulse Generator Model: 3210
Implantable Pulse Generator Serial Number: 2853392
Lead Channel Impedance Value: 387.5 Ohm
Lead Channel Impedance Value: 512.5 Ohm
Lead Channel Pacing Threshold Amplitude: 1 V
Lead Channel Pacing Threshold Amplitude: 2 V
Lead Channel Sensing Intrinsic Amplitude: 3.1 mV
Lead Channel Setting Pacing Amplitude: 2 V
Lead Channel Setting Pacing Amplitude: 2.5 V
Lead Channel Setting Pacing Amplitude: 3 V
Lead Channel Setting Pacing Pulse Width: 0.4 ms
Lead Channel Setting Pacing Pulse Width: 1 ms

## 2013-06-11 MED ORDER — AMIODARONE HCL 200 MG PO TABS: 100.0000 mg | ORAL_TABLET | Freq: Every day | ORAL | Status: DC

## 2013-06-11 NOTE — Assessment & Plan Note (Signed)
Her Medtronic biventricular pacemaker is working normally. Her left ventricular threshold is slightly elevated. We'll follow.

## 2013-06-11 NOTE — Assessment & Plan Note (Signed)
Her symptoms have worsened lately. She has a 2-D echo ordered. She will continue her current medications. I've recommended that she start low-dose amiodarone and we'll give her 100 mg daily. My hope is that a low dose of amiodarone will help reduce the amount of atrial fibrillation she is feeling. We will follow her QT closely. I'll see her back in a couple of months. We'll plan her of your echo.

## 2013-06-11 NOTE — Patient Instructions (Signed)
Your physician recommends that you schedule a follow-up appointment in: 3 months with Dr. Taylor   Your physician has recommended you make the following change in your medication:  1) Start Amiodarone 100 mg daily     

## 2013-06-11 NOTE — Progress Notes (Signed)
Echocardiogram performed.  

## 2013-06-11 NOTE — Progress Notes (Signed)
HPI [name] returns today for followup. She is a patient of Dr. Elvis Coil. She has a history of tachycardia mediated cardiomyopathy, and was thought to have chronic atrial fibrillation. She underwent AV node ablation and biventricular pacemaker insertion one year ago. Her ejection fraction improved from 30% to 45%. Her heart her symptoms also improved. Over the last few months, she has had some increasing dyspnea and worsening peripheral edema. Interestingly enough, review of her histograms of her pacemaker demonstrates that she in fact goes back into sinus rhythm for months at a time. Over the last 3 months however she has been in atrial fibrillation. Today she had no AV conduction but her histograms suggest that at times she does conduct. The patient has not had syncope. She admits to some dietary indiscretion.  Previous attempts to maintain her in sinus rhythm were unsuccessful. The patient was treated with amiodarone with good report however she had prolongation of the QT interval. I do not know her dosing at that time. Allergies  Allergen Reactions  . Betadine [Povidone Iodine] Itching  . Capoten [Captopril] Other (See Comments)    unknown  . Codone [Hydrocodone] Nausea And Vomiting  . Methadone Nausea And Vomiting  . Penicillins Swelling  . Propoxyphene And Methadone     Intolerance to darvocet     Current Outpatient Prescriptions  Medication Sig Dispense Refill  . amitriptyline (ELAVIL) 25 MG tablet Take 75 mg by mouth at bedtime.        Marland Kitchen atorvastatin (LIPITOR) 40 MG tablet Take 40 mg by mouth every evening.       . diltiazem (CARDIZEM CD) 240 MG 24 hr capsule Take 1 capsule (240 mg total) by mouth daily.  30 capsule  11  . donepezil (ARICEPT) 5 MG tablet Take 5 mg by mouth at bedtime.      Marland Kitchen esomeprazole (NEXIUM) 40 MG capsule Take 40 mg by mouth daily before breakfast.        . ezetimibe (ZETIA) 10 MG tablet Take 10 mg by mouth every evening.       . furosemide (LASIX) 40  MG tablet Take 1.5 tablets (60 mg total) by mouth every morning.  30 tablet  6  . insulin glargine (LANTUS) 100 UNIT/ML injection Inject 35 Units into the skin 2 (two) times daily.      . insulin lispro (HUMALOG) 100 UNIT/ML injection Inject 0-20 Units into the skin 2 (two) times daily as needed for high blood sugar. Home sliding scale If blood sugar is <200 take 15 units If blood sugar is >200 take 20 units If blood sugar is less than 70, don't take any      . levothyroxine (SYNTHROID, LEVOTHROID) 100 MCG tablet Take 100 mcg by mouth every morning.       . metoprolol succinate (TOPROL-XL) 50 MG 24 hr tablet Take 1 tablet (50 mg total) by mouth every morning. Take with or immediately following a meal.  30 tablet  3  . Multiple Vitamins-Minerals (EYE VITAMINS PO) Take 1 tablet by mouth 2 (two) times daily.       . nitroGLYCERIN (NITROSTAT) 0.4 MG SL tablet Place 0.4 mg under the tongue every 5 (five) minutes as needed. For chest pain      . potassium chloride SA (K-DUR,KLOR-CON) 20 MEQ tablet Take 10 mEq by mouth every morning.       . warfarin (COUMADIN) 5 MG tablet Take as directed by coumadin clinic  70 tablet  3  .  amiodarone (PACERONE) 200 MG tablet Take 0.5 tablets (100 mg total) by mouth daily.  45 tablet  3  . [DISCONTINUED] enoxaparin (LOVENOX) 100 MG/ML SOLN Inject into the skin every 12 (twelve) hours.        . [DISCONTINUED] simvastatin (ZOCOR) 80 MG tablet Take 80 mg by mouth at bedtime.         No current facility-administered medications for this visit.     Past Medical History  Diagnosis Date  . Coronary artery disease     a. Severe two vessel; s/p CABG (LIMA-LAD, SVG-diag, SVG-PDA);  b. 05/2012 NSTEMI in setting of rapid aflutter;  c. 06/2012 Lexiscan cardiolite: small, mild reversible defect in lateral wall->mild ischemia, low-risk->med Rx.  Marland Kitchen Hypertension   . Type II diabetes mellitus   . Hyperlipidemia   . Diabetic neuropathy   . CVA (cerebral vascular accident) 2004    . Hypothyroidism   . Chronic anemia   . GERD (gastroesophageal reflux disease)   . Arthritis   . Memory loss, short term   . COPD (chronic obstructive pulmonary disease)   . Blood transfusion without reported diagnosis   . Chronic combined systolic and diastolic CHF (congestive heart failure)     a. 07/2012 Echo: EF 30%, Gr II DD, Mild AS/MR, Mod-Sev TR, mild bi-atrial and RV dil, PASP .  . Moderate mitral regurgitation     a. mod by TEE 2012, mild by echo 2013, 07/2012  . Severe tricuspid regurgitation     a. Severe by TEE 2012, mod by echo 2013, mod-sev by echo 07/2012  . Chronic vulvitis 07/1993  . Idiopathic anemia 2006  . Atrial fibrillation     a. Failed amiodarone (prolonged QT), not a candidate for class IC meds due to her CAD. No Multaq due to CHF hx;  b. 07/2012 s/p SJM Anthem RF Bi-V PPM, ser # [id number] and AV node RFCA;  c. chronic coumadin.  . Atrial flutter   . Pacemaker   . GI bleed     a. 10/2011 - Dr Elnoria Howard did scope which showed hiatal hernia, colon revealed diverticula (diverticular bleed), internal/ext hemorrhoids.    ROS:   All systems reviewed and negative except as noted in the HPI.   Past Surgical History  Procedure Laterality Date  . Coronary artery bypass graft  06/2009    X3, LIMA to LAD, SVG to diagonal, SVG to the posterior descending artery.  . Tonsillectomy    . Knee arthroscopy  08/2001  . Transthoracic echocardiogram  11/12/2010     Ejection fraction felt to be around 50%.  Wall thickness was increased in a pattern of mild LVH.  Moderately dilated left atrium.  Mildly dilated right atrium. Right ventricle was mildly dilated with mildly reduced systolic function  . Cardiac catheterization  06/09/2009    inferior wall hypokinesia with ejection fraction of 55%.  . Abdominal hysterectomy    . Joint replacement    . Tee without cardioversion  05/23/2011    Procedure: TRANSESOPHAGEAL ECHOCARDIOGRAM (TEE);  Surgeon: Lewayne Bunting, MD;  Location: [facility]  ENDOSCOPY;  Service: Cardiovascular;  Laterality: N/A;  . Incision and drainage abscess  05/07/2012    Procedure: INCISION AND DRAINAGE ABSCESS;  Surgeon: Ardeth Sportsman, MD;  Location: [facility] OR;  Service: General;  Laterality: N/A;  Groin wound  . Total abdominal hysterectomy  1979  . Breast biopsy  Left  . Bilateral salpingectomy  10/2000  . Breast biopsy  09/18/2009    fibrocystic change  .  Coronary artery bypass graft  06/2009  . Pacemaker insertion  08/2012  . Colonoscopy N/A 01/19/2013    Procedure: COLONOSCOPY;  Surgeon: Louis Meckel, MD;  Location: [facility] ENDOSCOPY;  Service: Endoscopy;  Laterality: N/A;  . Esophagogastroduodenoscopy N/A 01/19/2013    Procedure: ESOPHAGOGASTRODUODENOSCOPY (EGD);  Surgeon: Louis Meckel, MD;  Location: [facility] ENDOSCOPY;  Service: Endoscopy;  Laterality: N/A;     Family History  Problem Relation Age of Onset  . Colon cancer Mother   . Stroke Mother   . Hypertension Mother   . Cancer Mother     breast  . Cancer Father     colon  . Heart attack Father     x2  . Renal Disease Sister     dialysis-twin sister  . Renal Disease Sister     renal bypass     History   Social History  . Marital Status: Married    Spouse Name: N/A    Number of Children: 2  . Years of Education: N/A   Occupational History  . teacher    Social History Main Topics  . Smoking status: Former Smoker    Types: Cigarettes  . Smokeless tobacco: Never Used  . Alcohol Use: No  . Drug Use: No  . Sexual Activity: No     Comment: hysterectomy   Other Topics Concern  . Not on file   Social History Narrative   Lives in [place] with spouse     BP 142/78  Pulse 91  Ht 5\' 3"  (1.6 m)  Wt 221 lb (100.245 kg)  BMI 39.16 kg/m2  Physical Exam:  Well appearing elderly woman, NAD HEENT: Unremarkable Neck:  No JVD, no thyromegally Back:  No CVA tenderness Lungs:  Clear with no wheezes, rales, or rhonchi. HEART:  Regular rate rhythm, no murmurs, no rubs, no  clicks Abd:  soft, positive bowel sounds, no organomegally, no rebound, no guarding Ext:  2 plus pulses, 2+ peripheral edema, no cyanosis, no clubbing Skin:  No rashes no nodules Neuro:  CN II through XII intact, motor grossly intact   DEVICE  Normal device function.  See PaceArt for details.   Assess/Plan:

## 2013-06-20 ENCOUNTER — Ambulatory Visit (HOSPITAL_BASED_OUTPATIENT_CLINIC_OR_DEPARTMENT_OTHER): Payer: Medicare Other | Admitting: Internal Medicine

## 2013-06-20 ENCOUNTER — Other Ambulatory Visit (HOSPITAL_BASED_OUTPATIENT_CLINIC_OR_DEPARTMENT_OTHER): Payer: Medicare Other

## 2013-06-20 DIAGNOSIS — D472 Monoclonal gammopathy: Secondary | ICD-10-CM

## 2013-06-20 DIAGNOSIS — N189 Chronic kidney disease, unspecified: Secondary | ICD-10-CM

## 2013-06-20 DIAGNOSIS — Z7901 Long term (current) use of anticoagulants: Secondary | ICD-10-CM

## 2013-06-20 DIAGNOSIS — N179 Acute kidney failure, unspecified: Secondary | ICD-10-CM

## 2013-06-20 DIAGNOSIS — E119 Type 2 diabetes mellitus without complications: Secondary | ICD-10-CM

## 2013-06-20 DIAGNOSIS — I5042 Chronic combined systolic (congestive) and diastolic (congestive) heart failure: Secondary | ICD-10-CM

## 2013-06-20 DIAGNOSIS — I4891 Unspecified atrial fibrillation: Secondary | ICD-10-CM

## 2013-06-20 DIAGNOSIS — I509 Heart failure, unspecified: Secondary | ICD-10-CM

## 2013-06-20 DIAGNOSIS — Z950 Presence of cardiac pacemaker: Secondary | ICD-10-CM

## 2013-06-20 DIAGNOSIS — E118 Type 2 diabetes mellitus with unspecified complications: Secondary | ICD-10-CM

## 2013-06-20 DIAGNOSIS — D649 Anemia, unspecified: Secondary | ICD-10-CM

## 2013-06-20 LAB — COMPREHENSIVE METABOLIC PANEL (CC13)
ALT: 21 U/L (ref 0–55)
AST: 27 U/L (ref 5–34)
Albumin: 3.4 g/dL — ABNORMAL LOW (ref 3.5–5.0)
Alkaline Phosphatase: 117 U/L (ref 40–150)
Anion Gap: 10 mEq/L (ref 3–11)
CO2: 28 mEq/L (ref 22–29)
Calcium: 9.3 mg/dL (ref 8.4–10.4)
Chloride: 102 mEq/L (ref 98–109)
Creatinine: 1.8 mg/dL — ABNORMAL HIGH (ref 0.6–1.1)
Potassium: 3.9 mEq/L (ref 3.5–5.1)
Sodium: 140 mEq/L (ref 136–145)
Total Protein: 7.9 g/dL (ref 6.4–8.3)

## 2013-06-20 LAB — CBC WITH DIFFERENTIAL/PLATELET
BASO%: 1.1 % (ref 0.0–2.0)
EOS%: 2.2 % (ref 0.0–7.0)
HCT: 33 % — ABNORMAL LOW (ref 34.8–46.6)
MCH: 30.6 pg (ref 25.1–34.0)
MCHC: 33.7 g/dL (ref 31.5–36.0)
MONO#: 1 10*3/uL — ABNORMAL HIGH (ref 0.1–0.9)
RBC: 3.64 10*6/uL — ABNORMAL LOW (ref 3.70–5.45)
RDW: 20.2 % — ABNORMAL HIGH (ref 11.2–14.5)
WBC: 6.9 10*3/uL (ref 3.9–10.3)
lymph#: 1.7 10*3/uL (ref 0.9–3.3)

## 2013-06-20 LAB — IRON AND TIBC CHCC
%SAT: 25 % (ref 21–57)
TIBC: 248 ug/dL (ref 236–444)

## 2013-06-20 NOTE — Progress Notes (Signed)
[facility] OFFICE PROGRESS NOTE  [name], MD [address] [facility], [place]. [place] [address]  DIAGNOSIS: MGUS (monoclonal gammopathy of unknown significance) - Plan: CBC with Differential, Comprehensive metabolic panel (Cmet) - [facility], Lactate dehydrogenase (LDH) - [facility], Ferritin, Iron and TIBC  Acute on chronic renal failure - Plan: CBC with Differential, Comprehensive metabolic panel (Cmet) - [facility], Lactate dehydrogenase (LDH) - [facility], Ferritin, Iron and TIBC  Diabetes mellitus type 2 with complications  Long term (current) use of anticoagulants  Chronic anemia  Atrial fibrillation with RVR  DOE (dyspnea on exertion)  Pacemaker  Chronic combined systolic and diastolic congestive heart failure  Chief Complaint  Patient presents with  . Anemia associated with chronic renal failure    CURRENT THERAPY: Aranesp 200 mcg subcutaneous prn for a hemoglobin less than 11.   INTERVAL HISTORY: [name] 79 y.o. female with a history of IgG lambda MGUS, Anemia associated with chronic renal failure who presents for follow up. She was last seen by me on 05/23/2013. She reports that she saw her primary care physician (Dr Timothy Lasso) a few months ago and she saw her cardiologist one month ago. Recently her pacemaker was interrogated on 06/11/2013 (report in EPIC).  She reports dsypnea with exertion and occasional palpitations.  During her visit with cardiology, she was started on amiodarone.  She reports feeling fine overall. She denies any recent hospitalizations or emergency room visits. She has received her flu vaccine. Her last feraheme 510 mg was received in August 10, 2012. She continues aranesp 200 mcg subcutaneous every month for a hemogloblin less than 11. She denies fevers of chills or acute shortness of breath. She also denies chest pain.  MEDICAL HISTORY: Past Medical History  Diagnosis Date  . Coronary artery disease     a. Severe two  vessel; s/p CABG (LIMA-LAD, SVG-diag, SVG-PDA);  b. 05/2012 NSTEMI in setting of rapid aflutter;  c. 06/2012 Lexiscan cardiolite: small, mild reversible defect in lateral wall->mild ischemia, low-risk->med Rx.  Marland Kitchen Hypertension   . Type II diabetes mellitus   . Hyperlipidemia   . Diabetic neuropathy   . CVA (cerebral vascular accident) 2004  . Hypothyroidism   . Chronic anemia   . GERD (gastroesophageal reflux disease)   . Arthritis   . Memory loss, short term   . COPD (chronic obstructive pulmonary disease)   . Blood transfusion without reported diagnosis   . Chronic combined systolic and diastolic CHF (congestive heart failure)     a. 07/2012 Echo: EF 30%, Gr II DD, Mild AS/MR, Mod-Sev TR, mild bi-atrial and RV dil, PASP .  . Moderate mitral regurgitation     a. mod by TEE 2012, mild by echo 2013, 07/2012  . Severe tricuspid regurgitation     a. Severe by TEE 2012, mod by echo 2013, mod-sev by echo 07/2012  . Chronic vulvitis 07/1993  . Idiopathic anemia 2006  . Atrial fibrillation     a. Failed amiodarone (prolonged QT), not a candidate for class IC meds due to her CAD. No Multaq due to CHF hx;  b. 07/2012 s/p SJM Anthem RF Bi-V PPM, ser # [id number] and AV node RFCA;  c. chronic coumadin.  . Atrial flutter   . Pacemaker   . GI bleed     a. 10/2011 - Dr Elnoria Howard did scope which showed hiatal hernia, colon revealed diverticula (diverticular bleed), internal/ext hemorrhoids.    INTERIM HISTORY: has Atrial fibrillation; Hypothyroidism; Chronic anticoagulation; CAD (coronary artery  disease); Diastolic dysfunction; Anemia associated with chronic renal failure; Atrial tachycardia; Right heart failure; MGUS (monoclonal gammopathy of unknown significance); GI bleed; CKD (chronic kidney disease) stage 3, GFR 30-59 ml/min; Abscess of right thigh s/p I&D 05/07/2012; Chronic systolic CHF (congestive heart failure); Atrial flutter; Long term (current) use of anticoagulants; Acute on chronic combined  systolic and diastolic congestive heart failure, NYHA class 4; Diabetes mellitus; Chronic anemia; Cardiomyopathy-cause unknown ? rate related v ischemic; Acute respiratory failure; Pulmonary edema; Pleural effusion; Atrial fibrillation with RVR; Physical deconditioning; Fever; Pneumonia; Weakness; Bacteremia due to Escherichia coli; Coronary artery disease; Hx of CABG; Chronic combined systolic and diastolic congestive heart failure; Diabetes mellitus type 2 with complications; Acute on chronic renal failure; Pacemaker; DOE (dyspnea on exertion); Benign neoplasm of stomach; Adenomatous duodenal polyp; and Diverticulosis of colon (without mention of hemorrhage) on her problem list.    ALLERGIES:  is allergic to betadine; capoten; codone; methadone; penicillins; and propoxyphene and methadone.  MEDICATIONS: has a current medication list which includes the following prescription(s): amiodarone, amitriptyline, atorvastatin, diltiazem, donepezil, esomeprazole, ezetimibe, furosemide, insulin glargine, insulin lispro, levothyroxine, metoprolol succinate, multiple vitamins-minerals, nitroglycerin, potassium chloride sa, and warfarin.  SURGICAL HISTORY:  Past Surgical History  Procedure Laterality Date  . Coronary artery bypass graft  06/2009    X3, LIMA to LAD, SVG to diagonal, SVG to the posterior descending artery.  . Tonsillectomy    . Knee arthroscopy  08/2001  . Transthoracic echocardiogram  11/12/2010     Ejection fraction felt to be around 50%.  Wall thickness was increased in a pattern of mild LVH.  Moderately dilated left atrium.  Mildly dilated right atrium. Right ventricle was mildly dilated with mildly reduced systolic function  . Cardiac catheterization  06/09/2009    inferior wall hypokinesia with ejection fraction of 55%.  . Abdominal hysterectomy    . Joint replacement    . Tee without cardioversion  05/23/2011    Procedure: TRANSESOPHAGEAL ECHOCARDIOGRAM (TEE);  Surgeon: Lewayne Bunting,  MD;  Location: [facility] ENDOSCOPY;  Service: Cardiovascular;  Laterality: N/A;  . Incision and drainage abscess  05/07/2012    Procedure: INCISION AND DRAINAGE ABSCESS;  Surgeon: Ardeth Sportsman, MD;  Location: [facility] OR;  Service: General;  Laterality: N/A;  Groin wound  . Total abdominal hysterectomy  1979  . Breast biopsy  Left  . Bilateral salpingectomy  10/2000  . Breast biopsy  09/18/2009    fibrocystic change  . Coronary artery bypass graft  06/2009  . Pacemaker insertion  08/2012  . Colonoscopy N/A 01/19/2013    Procedure: COLONOSCOPY;  Surgeon: Louis Meckel, MD;  Location: [facility] ENDOSCOPY;  Service: Endoscopy;  Laterality: N/A;  . Esophagogastroduodenoscopy N/A 01/19/2013    Procedure: ESOPHAGOGASTRODUODENOSCOPY (EGD);  Surgeon: Louis Meckel, MD;  Location: [facility];  Service: Endoscopy;  Laterality: N/A;    REVIEW OF SYSTEMS:   Constitutional: Denies fevers, chills or abnormal weight loss Eyes: Denies blurriness of vision Ears, nose, mouth, throat, and face: Denies mucositis or sore throat Respiratory: Denies cough, dyspnea or wheezes Cardiovascular: Denies palpitation, chest discomfort or lower extremity swelling Gastrointestinal:  Denies nausea, heartburn or change in bowel habits Skin: Denies abnormal skin rashes Lymphatics: Denies new lymphadenopathy or easy bruising Neurological:Denies numbness, tingling or new weaknesses Behavioral/Psych: Mood is stable, no new changes  All other systems were reviewed with the patient and are negative.  PHYSICAL EXAMINATION: ECOG PERFORMANCE STATUS: 0 - Asymptomatic  There were no vitals taken for this visit.  GENERAL:alert, no  distress and comfortable SKIN: skin color, texture, turgor are normal, no rashes or significant lesions EYES: normal, Conjunctiva are pink and non-injected, sclera clear OROPHARYNX:no exudate, no erythema and lips, buccal mucosa, and tongue normal  NECK: supple, thyroid normal size, non-tender, without  nodularity LYMPH:  no palpable lymphadenopathy in the cervical, axillary or supraclavicular LUNGS: clear to auscultation and percussion with normal breathing effort HEART:  RR with pacemaker in the left infraclavicular area. Chronic lower extremity brawny edema with stasis changes.  ABDOMEN:abdomen soft, non-tender and normal bowel sounds Musculoskeletal:no cyanosis of digits and no clubbing  NEURO: alert & oriented x 3 with fluent speech, no focal motor/sensory deficits  Labs:  Lab Results  Component Value Date   WBC 6.9 06/20/2013   HGB 11.1* 06/20/2013   HCT 33.0* 06/20/2013   MCV 90.6 06/20/2013   PLT 202 06/20/2013   NEUTROABS 4.0 06/20/2013      Chemistry      Component Value Date/Time   NA 140 06/20/2013 0927   NA 137 01/20/2013 0443   K 3.9 06/20/2013 0927   K 3.5 01/20/2013 0443   CL 100 01/20/2013 0443   CL 105 08/30/2012 1108   CO2 28 06/20/2013 0927   CO2 26 01/20/2013 0443   BUN 31.5* 06/20/2013 0927   BUN 24* 01/20/2013 0443   CREATININE 1.8* 06/20/2013 0927   CREATININE 1.63* 01/20/2013 0443   CREATININE 1.68* 05/16/2011 1352      Component Value Date/Time   CALCIUM 9.3 06/20/2013 0927   CALCIUM 8.9 01/20/2013 0443   ALKPHOS 117 06/20/2013 0927   ALKPHOS 145* 08/07/2012 0445   AST 27 06/20/2013 0927   AST 29 08/07/2012 0445   ALT 21 06/20/2013 0927   ALT 17 08/07/2012 0445   BILITOT 0.48 06/20/2013 0927   BILITOT 0.4 08/07/2012 0445     Basic Metabolic Panel:  Recent Labs Lab 06/20/13 0927  NA 140  K 3.9  CO2 28  GLUCOSE 157*  BUN 31.5*  CREATININE 1.8*  CALCIUM 9.3   GFR The CrCl is unknown because both a height and weight (above a minimum accepted value) are required for this calculation. Liver Function Tests:  Recent Labs Lab 06/20/13 0927  AST 27  ALT 21  ALKPHOS 117  BILITOT 0.48  PROT 7.9  ALBUMIN 3.4*   CBC:  Recent Labs Lab 06/20/13 0927  WBC 6.9  NEUTROABS 4.0  HGB 11.1*  HCT 33.0*  MCV 90.6  PLT 202    Recent Labs   06/20/13 0927  FERRITIN 453*  TIBC 248  IRON 61   Studies:  No results found.   RADIOGRAPHIC STUDIES: No results found.  ASSESSMENT: [name] 79 y.o. female with a history of MGUS (monoclonal gammopathy of unknown significance) - Plan: CBC with Differential, Comprehensive metabolic panel (Cmet) - [facility], Lactate dehydrogenase (LDH) - [facility], Ferritin, Iron and TIBC  Acute on chronic renal failure - Plan: CBC with Differential, Comprehensive metabolic panel (Cmet) - [facility], Lactate dehydrogenase (LDH) - [facility], Ferritin, Iron and TIBC  Diabetes mellitus type 2 with complications  Long term (current) use of anticoagulants  Chronic anemia  Atrial fibrillation with RVR  DOE (dyspnea on exertion)  Pacemaker  Chronic combined systolic and diastolic congestive heart failure   PLAN:  1. Anemia of chronic renal failure.  --She continues to do well.  --Today, her hemoglobin is 11.1 so she will not receive her aranesp 200 mcg subcutaneous shot. She will continue to have CBCs every 4 weeks and  have aranesp as indicated.  Her history is also notable for angiodyplasia and diverticulosis resulting in heme positive stool.   2. Afib/CHF/Pacer --Followed by cardiology.  Her medtronic biventricular pacemaker was working normally on 12/09.  She was started on amiodarone 100 mg daily.   3. CKDz. --Her creatinine is 1.8 today.    4. MGUS. Her IgG levels remains stable. She denies any symptoms including bone pain  5. Follow-up.   --patient will return to clinic in one month for CBC, CMP and iron studies.   She had a colonoscopy  With Dr. Barbette Hair. Kaplan on 01/19/2013.    All questions were answered. The patient knows to call the clinic with any problems, questions or concerns. We can certainly see the patient much sooner if necessary.  I spent 10 minutes counseling the patient face to face. The total time spent in the appointment was 15 minutes.    Sie Formisano, MD 06/20/2013 11:25  AM

## 2013-06-21 ENCOUNTER — Telehealth: Payer: Self-pay | Admitting: Internal Medicine

## 2013-07-15 ENCOUNTER — Ambulatory Visit (INDEPENDENT_AMBULATORY_CARE_PROVIDER_SITE_OTHER): Payer: Medicare Other | Admitting: *Deleted

## 2013-07-15 DIAGNOSIS — I4891 Unspecified atrial fibrillation: Secondary | ICD-10-CM

## 2013-07-15 DIAGNOSIS — Z7901 Long term (current) use of anticoagulants: Secondary | ICD-10-CM

## 2013-07-15 LAB — POCT INR: INR: 2.5

## 2013-07-23 ENCOUNTER — Telehealth: Payer: Self-pay | Admitting: Internal Medicine

## 2013-07-23 ENCOUNTER — Encounter: Payer: Self-pay | Admitting: Internal Medicine

## 2013-07-23 ENCOUNTER — Ambulatory Visit (HOSPITAL_COMMUNITY)
Admission: RE | Admit: 2013-07-23 | Discharge: 2013-07-23 | Disposition: A | Discharge status: Home / Self Care | Payer: Medicare Other | Source: Ambulatory Visit | Attending: Internal Medicine | Admitting: Internal Medicine

## 2013-07-23 ENCOUNTER — Ambulatory Visit (HOSPITAL_BASED_OUTPATIENT_CLINIC_OR_DEPARTMENT_OTHER): Payer: Medicare Other | Admitting: Internal Medicine

## 2013-07-23 ENCOUNTER — Other Ambulatory Visit (HOSPITAL_BASED_OUTPATIENT_CLINIC_OR_DEPARTMENT_OTHER): Payer: Medicare Other

## 2013-07-23 ENCOUNTER — Ambulatory Visit: Payer: Medicare Other

## 2013-07-23 VITALS — BP 157/63 | HR 70 | Temp 97.6°F | Resp 18 | Ht 63.0 in | Wt 228.2 lb

## 2013-07-23 DIAGNOSIS — N179 Acute kidney failure, unspecified: Secondary | ICD-10-CM

## 2013-07-23 DIAGNOSIS — D631 Anemia in chronic kidney disease: Secondary | ICD-10-CM

## 2013-07-23 DIAGNOSIS — I4891 Unspecified atrial fibrillation: Secondary | ICD-10-CM

## 2013-07-23 DIAGNOSIS — D649 Anemia, unspecified: Secondary | ICD-10-CM

## 2013-07-23 DIAGNOSIS — N039 Chronic nephritic syndrome with unspecified morphologic changes: Secondary | ICD-10-CM

## 2013-07-23 DIAGNOSIS — N189 Chronic kidney disease, unspecified: Secondary | ICD-10-CM

## 2013-07-23 DIAGNOSIS — D472 Monoclonal gammopathy: Secondary | ICD-10-CM

## 2013-07-23 DIAGNOSIS — I2510 Atherosclerotic heart disease of native coronary artery without angina pectoris: Secondary | ICD-10-CM

## 2013-07-23 DIAGNOSIS — I5043 Acute on chronic combined systolic (congestive) and diastolic (congestive) heart failure: Secondary | ICD-10-CM

## 2013-07-23 DIAGNOSIS — I509 Heart failure, unspecified: Secondary | ICD-10-CM

## 2013-07-23 DIAGNOSIS — R0609 Other forms of dyspnea: Secondary | ICD-10-CM

## 2013-07-23 LAB — CBC WITH DIFFERENTIAL/PLATELET
BASO%: 0.7 % (ref 0.0–2.0)
BASOS ABS: 0 10*3/uL (ref 0.0–0.1)
EOS ABS: 0.2 10*3/uL (ref 0.0–0.5)
EOS%: 2.4 % (ref 0.0–7.0)
HCT: 28.8 % — ABNORMAL LOW (ref 34.8–46.6)
HEMOGLOBIN: 9.3 g/dL — AB (ref 11.6–15.9)
LYMPH%: 20 % (ref 14.0–49.7)
MCH: 30.3 pg (ref 25.1–34.0)
MCHC: 32.4 g/dL (ref 31.5–36.0)
MCV: 93.3 fL (ref 79.5–101.0)
MONO#: 0.8 10*3/uL (ref 0.1–0.9)
MONO%: 12.4 % (ref 0.0–14.0)
NEUT%: 64.5 % (ref 38.4–76.8)
NEUTROS ABS: 4.1 10*3/uL (ref 1.5–6.5)
Platelets: 166 10*3/uL (ref 145–400)
RBC: 3.08 10*6/uL — ABNORMAL LOW (ref 3.70–5.45)
RDW: 17.1 % — AB (ref 11.2–14.5)
WBC: 6.3 10*3/uL (ref 3.9–10.3)
lymph#: 1.3 10*3/uL (ref 0.9–3.3)

## 2013-07-23 LAB — COMPREHENSIVE METABOLIC PANEL (CC13)
ALT: 22 U/L (ref 0–55)
ANION GAP: 10 meq/L (ref 3–11)
AST: 24 U/L (ref 5–34)
Albumin: 3.3 g/dL — ABNORMAL LOW (ref 3.5–5.0)
Alkaline Phosphatase: 108 U/L (ref 40–150)
BILIRUBIN TOTAL: 0.4 mg/dL (ref 0.20–1.20)
BUN: 28.5 mg/dL — ABNORMAL HIGH (ref 7.0–26.0)
CALCIUM: 9 mg/dL (ref 8.4–10.4)
CO2: 27 meq/L (ref 22–29)
Chloride: 106 mEq/L (ref 98–109)
Creatinine: 1.7 mg/dL — ABNORMAL HIGH (ref 0.6–1.1)
Glucose: 109 mg/dl (ref 70–140)
Potassium: 3.9 mEq/L (ref 3.5–5.1)
SODIUM: 142 meq/L (ref 136–145)
Total Protein: 7.8 g/dL (ref 6.4–8.3)

## 2013-07-23 LAB — IRON AND TIBC CHCC
%SAT: 23 % (ref 21–57)
Iron: 60 ug/dL (ref 41–142)
TIBC: 254 ug/dL (ref 236–444)
UIBC: 194 ug/dL (ref 120–384)

## 2013-07-23 LAB — FERRITIN CHCC: Ferritin: 317 ng/ml — ABNORMAL HIGH (ref 9–269)

## 2013-07-23 LAB — LACTATE DEHYDROGENASE (CC13): LDH: 247 U/L — ABNORMAL HIGH (ref 125–245)

## 2013-07-23 MED ORDER — DARBEPOETIN ALFA-POLYSORBATE 200 MCG/0.4ML IJ SOLN
200.0000 ug | Freq: Once | INTRAMUSCULAR | Status: AC
  Administered 2013-07-23: 200 ug via SUBCUTANEOUS
  Filled 2013-07-23: qty 0.4

## 2013-07-23 NOTE — Patient Instructions (Signed)
Blood Transfusion Information WHAT IS A BLOOD TRANSFUSION? A transfusion is the replacement of blood or some of its parts. Blood is made up of multiple cells which provide different functions.  Red blood cells carry oxygen and are used for blood loss replacement.  Skora blood cells fight against infection.  Platelets control bleeding.  Plasma helps clot blood.  Other blood products are available for specialized needs, such as hemophilia or other clotting disorders. BEFORE THE TRANSFUSION  Who gives blood for transfusions?   You may be able to donate blood to be used at a later date on yourself (autologous donation).  Relatives can be asked to donate blood. This is generally not any safer than if you have received blood from a stranger. The same precautions are taken to ensure safety when a relative's blood is donated.  Healthy volunteers who are fully evaluated to make sure their blood is safe. This is blood bank blood. Transfusion therapy is the safest it has ever been in the practice of medicine. Before blood is taken from a donor, a complete history is taken to make sure that person has no history of diseases nor engages in risky social behavior (examples are intravenous drug use or sexual activity with multiple partners). The donor's travel history is screened to minimize risk of transmitting infections, such as malaria. The donated blood is tested for signs of infectious diseases, such as HIV and hepatitis. The blood is then tested to be sure it is compatible with you in order to minimize the chance of a transfusion reaction. If you or a relative donates blood, this is often done in anticipation of surgery and is not appropriate for emergency situations. It takes many days to process the donated blood. RISKS AND COMPLICATIONS Although transfusion therapy is very safe and saves many lives, the main dangers of transfusion include:   Getting an infectious disease.  Developing a  transfusion reaction. This is an allergic reaction to something in the blood you were given. Every precaution is taken to prevent this. The decision to have a blood transfusion has been considered carefully by your caregiver before blood is given. Blood is not given unless the benefits outweigh the risks. AFTER THE TRANSFUSION  Right after receiving a blood transfusion, you will usually feel much better and more energetic. This is especially true if your red blood cells have gotten low (anemic). The transfusion raises the level of the red blood cells which carry oxygen, and this usually causes an energy increase.  The nurse administering the transfusion will monitor you carefully for complications. HOME CARE INSTRUCTIONS  No special instructions are needed after a transfusion. You may find your energy is better. Speak with your caregiver about any limitations on activity for underlying diseases you may have. SEEK MEDICAL CARE IF:   Your condition is not improving after your transfusion.  You develop redness or irritation at the intravenous (IV) site. SEEK IMMEDIATE MEDICAL CARE IF:  Any of the following symptoms occur over the next 12 hours:  Shaking chills.  You have a temperature by mouth above 102 F (38.9 C), not controlled by medicine.  Chest, back, or muscle pain.  People around you feel you are not acting correctly or are confused.  Shortness of breath or difficulty breathing.  Dizziness and fainting.  You get a rash or develop hives.  You have a decrease in urine output.  Your urine turns a dark color or changes to pink, red, or brown. Any of the following   symptoms occur over the next 10 days:  You have a temperature by mouth above 102 F (38.9 C), not controlled by medicine.  Shortness of breath.  Weakness after normal activity.  The Eisenbeis part of the eye turns yellow (jaundice).  You have a decrease in the amount of urine or are urinating less often.  Your  urine turns a dark color or changes to pink, red, or brown. Document Released: 06/17/2000 Document Revised: 09/12/2011 Document Reviewed: 02/04/2008 [employer] Patient Information 2014 [employer].  

## 2013-07-23 NOTE — Telephone Encounter (Signed)
pt sent back to lab and given appt schedule for january thru march. due to other appts pt PRBC's scheduled for friday - ok per desk nurse. per desk nurse pt can still be type/crossed today.

## 2013-07-25 NOTE — Progress Notes (Signed)
[facility] OFFICE PROGRESS NOTE  [name], MD [address] [facility], [place]. [place] [address]  DIAGNOSIS: MGUS (monoclonal gammopathy of unknown significance)  Acute on chronic combined systolic and diastolic congestive heart failure, NYHA class 4  Atrial fibrillation with RVR - Plan: CBC with Differential, Comprehensive metabolic panel (Cmet) - [facility], Lactate dehydrogenase (LDH) - [facility], CBC with Differential, Care order/instruction, 0.9 %  sodium chloride infusion, sodium chloride 0.9 % injection 10 mL, heparin lock flush 100 unit/mL, heparin lock flush 100 unit/mL, sodium chloride 0.9 % injection 3 mL, Transfuse RBC, acetaminophen (TYLENOL) tablet 650 mg, diphenhydrAMINE (BENADRYL) capsule 25 mg, CANCELED: Type and screen, CANCELED: Prepare RBC  CAD (coronary artery disease) - Plan: CBC with Differential, Comprehensive metabolic panel (Cmet) - [facility], Lactate dehydrogenase (LDH) - [facility], CBC with Differential, Care order/instruction, 0.9 %  sodium chloride infusion, sodium chloride 0.9 % injection 10 mL, heparin lock flush 100 unit/mL, heparin lock flush 100 unit/mL, sodium chloride 0.9 % injection 3 mL, Transfuse RBC, acetaminophen (TYLENOL) tablet 650 mg, diphenhydrAMINE (BENADRYL) capsule 25 mg, CANCELED: Type and screen, CANCELED: Prepare RBC  DOE (dyspnea on exertion)  Symptomatic anemia - Plan: Care order/instruction, 0.9 %  sodium chloride infusion, sodium chloride 0.9 % injection 10 mL, heparin lock flush 100 unit/mL, heparin lock flush 100 unit/mL, sodium chloride 0.9 % injection 3 mL, Transfuse RBC, acetaminophen (TYLENOL) tablet 650 mg, diphenhydrAMINE (BENADRYL) capsule 25 mg, CANCELED: Type and screen, CANCELED: Prepare RBC  Anemia associated with chronic renal failure - Plan: ARANESP TREATMENT CONDITION, SCHEDULING COMMUNICATION INJECTION, darbepoetin (ARANESP) injection 200 mcg, SCHEDULING COMMUNICATION LAB, CANCELED: CBC with  Differential  Chief Complaint  Patient presents with  . MGUS (monoclonal gammopathy of unknown significance)    CURRENT THERAPY: Aranesp 200 mcg subcutaneous prn for a hemoglobin less than 11.   INTERVAL HISTORY: [name] 80 y.o. female with a history of IgG lambda MGUS, Anemia associated with chronic renal failure who presents for follow up. She was last seen by me on 06/20/2013. She reports that she saw her primary care physician (Dr Timothy Lasso) a few months ago and she saw her cardiologist two months ago. Recently her pacemaker was interrogated on 06/11/2013 (report in EPIC).  She reports worsening dyspnea with exertion and more frequent palpitations.  During her last visit with cardiology, she was started on amiodarone.  She reports feeling very tired and unable to walk long distances. She denies any recent hospitalizations or emergency room visits. She has received her flu vaccine. Her last feraheme 510 mg was received in August 10, 2012. She continues aranesp 200 mcg subcutaneous every month for a hemogloblin less than 11. She denies fevers of chills or acute shortness of breath. She also denies chest pain.  MEDICAL HISTORY: Past Medical History  Diagnosis Date  . Coronary artery disease     a. Severe two vessel; s/p CABG (LIMA-LAD, SVG-diag, SVG-PDA);  b. 05/2012 NSTEMI in setting of rapid aflutter;  c. 06/2012 Lexiscan cardiolite: small, mild reversible defect in lateral wall->mild ischemia, low-risk->med Rx.  Marland Kitchen Hypertension   . Type II diabetes mellitus   . Hyperlipidemia   . Diabetic neuropathy   . CVA (cerebral vascular accident) 2004  . Hypothyroidism   . Chronic anemia   . GERD (gastroesophageal reflux disease)   . Arthritis   . Memory loss, short term   . COPD (chronic obstructive pulmonary disease)   . Blood transfusion without reported diagnosis   . Chronic combined systolic and diastolic  CHF (congestive heart failure)     a. 07/2012 Echo: EF 30%, Gr II DD, Mild  AS/MR, Mod-Sev TR, mild bi-atrial and RV dil, PASP 64mmHg.  . Moderate mitral regurgitation     a. mod by TEE 2012, mild by echo 2013, 07/2012  . Severe tricuspid regurgitation     a. Severe by TEE 2012, mod by echo 2013, mod-sev by echo 07/2012  . Chronic vulvitis 07/1993  . Idiopathic anemia 2006  . Atrial fibrillation     a. Failed amiodarone (prolonged QT), not a candidate for class IC meds due to her CAD. No Multaq due to CHF hx;  b. 07/2012 s/p SJM Anthem RF Bi-V PPM, ser # [id number] and AV node RFCA;  c. chronic coumadin.  . Atrial flutter   . Pacemaker   . GI bleed     a. 10/2011 - Dr Benson Norway did scope which showed hiatal hernia, colon revealed diverticula (diverticular bleed), internal/ext hemorrhoids.    INTERIM HISTORY: has Atrial fibrillation; Hypothyroidism; Chronic anticoagulation; CAD (coronary artery disease); Diastolic dysfunction; Anemia associated with chronic renal failure; Atrial tachycardia; Right heart failure; MGUS (monoclonal gammopathy of unknown significance); GI bleed; CKD (chronic kidney disease) stage 3, GFR 30-59 ml/min; Abscess of right thigh s/p I&D 08/10/2534; Chronic systolic CHF (congestive heart failure); Atrial flutter; Long term (current) use of anticoagulants; Acute on chronic combined systolic and diastolic congestive heart failure, NYHA class 4; Diabetes mellitus; Chronic anemia; Cardiomyopathy-cause unknown ? rate related v ischemic; Acute respiratory failure; Pulmonary edema; Pleural effusion; Atrial fibrillation with RVR; Physical deconditioning; Fever; Pneumonia; Weakness; Bacteremia due to Escherichia coli; Coronary artery disease; Hx of CABG; Chronic combined systolic and diastolic congestive heart failure; Diabetes mellitus type 2 with complications; Acute on chronic renal failure; Pacemaker; DOE (dyspnea on exertion); Benign neoplasm of stomach; Adenomatous duodenal polyp; Diverticulosis of colon (without mention of hemorrhage); and Symptomatic anemia on her  problem list.    ALLERGIES:  is allergic to betadine; capoten; codone; methadone; penicillins; and propoxyphene and methadone.  MEDICATIONS: has a current medication list which includes the following prescription(s): amiodarone, amitriptyline, atorvastatin, diltiazem, donepezil, esomeprazole, ezetimibe, furosemide, insulin glargine, insulin lispro, levothyroxine, metoprolol succinate, multiple vitamins-minerals, nitroglycerin, potassium chloride sa, and warfarin.  SURGICAL HISTORY:  Past Surgical History  Procedure Laterality Date  . Coronary artery bypass graft  06/2009    X3, LIMA to LAD, SVG to diagonal, SVG to the posterior descending artery.  . Tonsillectomy    . Knee arthroscopy  08/2001  . Transthoracic echocardiogram  11/12/2010     Ejection fraction felt to be around 50%.  Wall thickness was increased in a pattern of mild LVH.  Moderately dilated left atrium.  Mildly dilated right atrium. Right ventricle was mildly dilated with mildly reduced systolic function  . Cardiac catheterization  06/09/2009    inferior wall hypokinesia with ejection fraction of 55%.  . Abdominal hysterectomy    . Joint replacement    . Tee without cardioversion  05/23/2011    Procedure: TRANSESOPHAGEAL ECHOCARDIOGRAM (TEE);  Surgeon: Lelon Perla, MD;  Location: [place];  Service: Cardiovascular;  Laterality: N/A;  . Incision and drainage abscess  05/07/2012    Procedure: INCISION AND DRAINAGE ABSCESS;  Surgeon: Adin Hector, MD;  Location: [place];  Service: General;  Laterality: N/A;  Groin wound  . Total abdominal hysterectomy  1979  . Breast biopsy  Left  . Bilateral salpingectomy  10/2000  . Breast biopsy  09/18/2009    fibrocystic change  . Coronary  artery bypass graft  06/2009  . Pacemaker insertion  08/2012  . Colonoscopy N/A 01/19/2013    Procedure: COLONOSCOPY;  Surgeon: Inda Castle, MD;  Location: [facility];  Service: Endoscopy;  Laterality: N/A;  . Esophagogastroduodenoscopy N/A  01/19/2013    Procedure: ESOPHAGOGASTRODUODENOSCOPY (EGD);  Surgeon: Inda Castle, MD;  Location: [facility];  Service: Endoscopy;  Laterality: N/A;    REVIEW OF SYSTEMS:   Constitutional: Denies fevers, chills or abnormal weight loss Eyes: Denies blurriness of vision Ears, nose, mouth, throat, and face: Denies mucositis or sore throat Respiratory: Denies cough, dyspnea or wheezes Cardiovascular: Denies palpitation, chest discomfort or lower extremity swelling Gastrointestinal:  Denies nausea, heartburn or change in bowel habits Skin: Denies abnormal skin rashes Lymphatics: Denies new lymphadenopathy or easy bruising Neurological:Denies numbness, tingling or new weaknesses Behavioral/Psych: Mood is stable, no new changes  All other systems were reviewed with the patient and are negative.  PHYSICAL EXAMINATION: ECOG PERFORMANCE STATUS: 1 - Symptomatic but completely ambulatory  Blood pressure 157/63, pulse 70, temperature 97.6 F (36.4 C), temperature source Oral, resp. rate 18, height 5\' 3"  (1.6 m), weight 228 lb 3.2 oz (103.511 kg), SpO2 97.00%.  GENERAL:alert, no distress and comfortable; moderately obese SKIN: skin color, texture, turgor are normal, no rashes or significant lesions EYES: normal, Conjunctiva are pink and non-injected, sclera clear OROPHARYNX:no exudate, no erythema and lips, buccal mucosa, and tongue normal  NECK: supple, thyroid normal size, non-tender, without nodularity LYMPH:  no palpable lymphadenopathy in the cervical, axillary or supraclavicular LUNGS: clear to auscultation and percussion with normal breathing effort HEART:  RR with pacemaker in the left infraclavicular area. Chronic lower extremity brawny edema with stasis changes.  ABDOMEN:abdomen soft, non-tender and normal bowel sounds Musculoskeletal:no cyanosis of digits and no clubbing  NEURO: alert & oriented x 3 with fluent speech, no focal motor/sensory deficits  Labs:  Lab Results   Component Value Date   WBC 6.3 07/23/2013   HGB 9.3* 07/23/2013   HCT 28.8* 07/23/2013   MCV 93.3 07/23/2013   PLT 166 07/23/2013   NEUTROABS 4.1 07/23/2013      Chemistry      Component Value Date/Time   NA 142 07/23/2013 1313   NA 137 01/20/2013 0443   K 3.9 07/23/2013 1313   K 3.5 01/20/2013 0443   CL 100 01/20/2013 0443   CL 105 08/30/2012 1108   CO2 27 07/23/2013 1313   CO2 26 01/20/2013 0443   BUN 28.5* 07/23/2013 1313   BUN 24* 01/20/2013 0443   CREATININE 1.7* 07/23/2013 1313   CREATININE 1.63* 01/20/2013 0443   CREATININE 1.68* 05/16/2011 1352      Component Value Date/Time   CALCIUM 9.0 07/23/2013 1313   CALCIUM 8.9 01/20/2013 0443   ALKPHOS 108 07/23/2013 1313   ALKPHOS 145* 08/07/2012 0445   AST 24 07/23/2013 1313   AST 29 08/07/2012 0445   ALT 22 07/23/2013 1313   ALT 17 08/07/2012 0445   BILITOT 0.40 07/23/2013 1313   BILITOT 0.4 08/07/2012 0445     Basic Metabolic Panel:  Recent Labs Lab 07/23/13 1313  NA 142  K 3.9  CO2 27  GLUCOSE 109  BUN 28.5*  CREATININE 1.7*  CALCIUM 9.0   GFR Estimated Creatinine Clearance: 31.3 ml/min (by C-G formula based on Cr of 1.7). Liver Function Tests:  Recent Labs Lab 07/23/13 1313  AST 24  ALT 22  ALKPHOS 108  BILITOT 0.40  PROT 7.8  ALBUMIN 3.3*   CBC:  Recent Labs Lab 07/23/13 1314  WBC 6.3  NEUTROABS 4.1  HGB 9.3*  HCT 28.8*  MCV 93.3  PLT 166    Recent Labs  07/23/13 1314  FERRITIN 317*  TIBC 254  IRON 60   Studies:  No results found.   RADIOGRAPHIC STUDIES: No results found.  ASSESSMENT: [name] 80 y.o. female with a history of MGUS (monoclonal gammopathy of unknown significance)  Acute on chronic combined systolic and diastolic congestive heart failure, NYHA class 4  Atrial fibrillation with RVR - Plan: CBC with Differential, Comprehensive metabolic panel (Cmet) - [facility], Lactate dehydrogenase (LDH) - [facility], CBC with Differential, Care order/instruction, 0.9 %  sodium chloride infusion,  sodium chloride 0.9 % injection 10 mL, heparin lock flush 100 unit/mL, heparin lock flush 100 unit/mL, sodium chloride 0.9 % injection 3 mL, Transfuse RBC, acetaminophen (TYLENOL) tablet 650 mg, diphenhydrAMINE (BENADRYL) capsule 25 mg, CANCELED: Type and screen, CANCELED: Prepare RBC  CAD (coronary artery disease) - Plan: CBC with Differential, Comprehensive metabolic panel (Cmet) - [facility], Lactate dehydrogenase (LDH) - [facility], CBC with Differential, Care order/instruction, 0.9 %  sodium chloride infusion, sodium chloride 0.9 % injection 10 mL, heparin lock flush 100 unit/mL, heparin lock flush 100 unit/mL, sodium chloride 0.9 % injection 3 mL, Transfuse RBC, acetaminophen (TYLENOL) tablet 650 mg, diphenhydrAMINE (BENADRYL) capsule 25 mg, CANCELED: Type and screen, CANCELED: Prepare RBC  DOE (dyspnea on exertion)  Symptomatic anemia - Plan: Care order/instruction, 0.9 %  sodium chloride infusion, sodium chloride 0.9 % injection 10 mL, heparin lock flush 100 unit/mL, heparin lock flush 100 unit/mL, sodium chloride 0.9 % injection 3 mL, Transfuse RBC, acetaminophen (TYLENOL) tablet 650 mg, diphenhydrAMINE (BENADRYL) capsule 25 mg, CANCELED: Type and screen, CANCELED: Prepare RBC  Anemia associated with chronic renal failure - Plan: ARANESP TREATMENT CONDITION, SCHEDULING COMMUNICATION INJECTION, darbepoetin (ARANESP) injection 200 mcg, SCHEDULING COMMUNICATION LAB, CANCELED: CBC with Differential   PLAN:  1. Anemia of chronic renal failure.  --She is much more symptomatic.   Marland Kitchen  --Today, her hemoglobin is 9.3 down from 11.1 so she will  receive her aranesp 200 mcg subcutaneous shot. She will continue to have CBCs every 4 weeks and have aranesp as indicated.  Her history is also notable for angiodyplasia and diverticulosis resulting in heme positive stool.  Given her symptoms of anemia and prior history of chest pain and a long admission to hospital when her hemoglobin was less than 8.5, we will set up for  one unit of packed RBCs.  2. Afib/CHF/Pacer --Followed by cardiology.  Her medtronic biventricular pacemaker was working normally on 12/09.  She was started on amiodarone 100 mg daily.  We will discuss rather she needs increased dosing secondary to more cardiac symptoms.   3. CKDz. --Her creatinine is 1.7 today.    4. MGUS. Her IgG levels remains stable. She denies any symptoms including bone pain  5. Follow-up.   --patient will return to clinic in one month for CBC, CMP and iron studies.   She had a colonoscopy  With Dr. Sandy Salaam. Kaplan on 01/19/2013.    All questions were answered. The patient knows to call the clinic with any problems, questions or concerns. We can certainly see the patient much sooner if necessary.  I spent 15 minutes counseling the patient face to face. The total time spent in the appointment was215 minutes.    Addiel Mccardle, MD 07/25/2013 5:20 AM

## 2013-07-26 ENCOUNTER — Ambulatory Visit (HOSPITAL_BASED_OUTPATIENT_CLINIC_OR_DEPARTMENT_OTHER): Payer: Medicare Other

## 2013-07-26 VITALS — BP 150/69 | HR 72 | Temp 97.9°F | Resp 18

## 2013-07-26 DIAGNOSIS — D649 Anemia, unspecified: Secondary | ICD-10-CM

## 2013-07-26 DIAGNOSIS — I4891 Unspecified atrial fibrillation: Secondary | ICD-10-CM

## 2013-07-26 DIAGNOSIS — I2510 Atherosclerotic heart disease of native coronary artery without angina pectoris: Secondary | ICD-10-CM

## 2013-07-26 MED ORDER — ACETAMINOPHEN 325 MG PO TABS
650.0000 mg | ORAL_TABLET | Freq: Once | ORAL | Status: AC
  Administered 2013-07-26: 650 mg via ORAL

## 2013-07-26 MED ORDER — ACETAMINOPHEN 325 MG PO TABS
ORAL_TABLET | ORAL | Status: AC
  Filled 2013-07-26: qty 2

## 2013-07-26 MED ORDER — DIPHENHYDRAMINE HCL 25 MG PO CAPS
ORAL_CAPSULE | ORAL | Status: AC
  Filled 2013-07-26: qty 1

## 2013-07-26 MED ORDER — SODIUM CHLORIDE 0.9 % IV SOLN
250.0000 mL | Freq: Once | INTRAVENOUS | Status: AC
Start: 2013-07-26 — End: 2013-07-26
  Administered 2013-07-26: 250 mL via INTRAVENOUS

## 2013-07-26 MED ORDER — DIPHENHYDRAMINE HCL 25 MG PO CAPS
25.0000 mg | ORAL_CAPSULE | Freq: Once | ORAL | Status: AC
  Administered 2013-07-26: 25 mg via ORAL

## 2013-07-26 NOTE — Patient Instructions (Signed)
Blood Transfusion  A blood transfusion replaces your blood or some of its parts. Blood is replaced when you have lost blood because of surgery, an accident, or for severe blood conditions like anemia. You can donate blood to be used on yourself if you have a planned surgery. If you lose blood during that surgery, your own blood can be given back to you. Any blood given to you is checked to make sure it matches your blood type. Your temperature, blood pressure, and heart rate (vital signs) will be checked often.  GET HELP RIGHT AWAY IF:   You feel sick to your stomach (nauseous) or throw up (vomit).  You have watery poop (diarrhea).  You have shortness of breath or trouble breathing.  You have blood in your pee (urine) or have dark colored pee.  You have chest pain or tightness.  Your eyes or skin turn yellow (jaundice).  You have a temperature by mouth above 102 F (38.9 C), not controlled by medicine.  You start to shake and have chills.  You develop a a red rash (hives) or feel itchy.  You develop lightheadedness or feel confused.  You develop back, joint, or muscle pain.  You do not feel hungry (lost appetite).  You feel tired, restless, or nervous.  You develop belly (abdominal) cramps. Document Released: 09/16/2008 Document Revised: 09/12/2011 Document Reviewed: 09/16/2008 [employer] Patient Information 2014 [employer].  

## 2013-07-28 LAB — TYPE AND SCREEN
ABO/RH(D): A POS
ANTIBODY SCREEN: NEGATIVE
DONOR AG TYPE: NEGATIVE
Unit division: 0

## 2013-07-30 ENCOUNTER — Other Ambulatory Visit: Payer: Self-pay

## 2013-07-30 MED ORDER — POTASSIUM CHLORIDE CRYS ER 20 MEQ PO TBCR: 20.0000 meq | EXTENDED_RELEASE_TABLET | Freq: Every morning | ORAL | Status: DC

## 2013-08-06 ENCOUNTER — Other Ambulatory Visit (HOSPITAL_BASED_OUTPATIENT_CLINIC_OR_DEPARTMENT_OTHER): Payer: Medicare Other

## 2013-08-06 DIAGNOSIS — I2510 Atherosclerotic heart disease of native coronary artery without angina pectoris: Secondary | ICD-10-CM

## 2013-08-06 DIAGNOSIS — I4891 Unspecified atrial fibrillation: Secondary | ICD-10-CM

## 2013-08-06 LAB — CBC WITH DIFFERENTIAL/PLATELET
BASO%: 0.4 % (ref 0.0–2.0)
Basophils Absolute: 0 10*3/uL (ref 0.0–0.1)
EOS%: 2.1 % (ref 0.0–7.0)
Eosinophils Absolute: 0.2 10*3/uL (ref 0.0–0.5)
HCT: 31.2 % — ABNORMAL LOW (ref 34.8–46.6)
HGB: 9.9 g/dL — ABNORMAL LOW (ref 11.6–15.9)
LYMPH#: 1.6 10*3/uL (ref 0.9–3.3)
LYMPH%: 20.5 % (ref 14.0–49.7)
MCH: 30 pg (ref 25.1–34.0)
MCHC: 31.7 g/dL (ref 31.5–36.0)
MCV: 94.5 fL (ref 79.5–101.0)
MONO#: 1 10*3/uL — ABNORMAL HIGH (ref 0.1–0.9)
MONO%: 12.2 % (ref 0.0–14.0)
NEUT%: 64.8 % (ref 38.4–76.8)
NEUTROS ABS: 5.1 10*3/uL (ref 1.5–6.5)
NRBC: 0 % (ref 0–0)
Platelets: 166 10*3/uL (ref 145–400)
RBC: 3.3 10*6/uL — AB (ref 3.70–5.45)
RDW: 16.1 % — AB (ref 11.2–14.5)
WBC: 7.8 10*3/uL (ref 3.9–10.3)

## 2013-08-07 ENCOUNTER — Encounter: Payer: Self-pay | Admitting: Internal Medicine

## 2013-08-07 ENCOUNTER — Ambulatory Visit (INDEPENDENT_AMBULATORY_CARE_PROVIDER_SITE_OTHER): Payer: Medicare Other | Admitting: Pharmacist

## 2013-08-07 ENCOUNTER — Ambulatory Visit (INDEPENDENT_AMBULATORY_CARE_PROVIDER_SITE_OTHER): Payer: Medicare Other | Admitting: Internal Medicine

## 2013-08-07 VITALS — BP 152/77 | HR 74 | Ht 63.0 in | Wt 228.0 lb

## 2013-08-07 DIAGNOSIS — Z7901 Long term (current) use of anticoagulants: Secondary | ICD-10-CM

## 2013-08-07 DIAGNOSIS — I4891 Unspecified atrial fibrillation: Secondary | ICD-10-CM

## 2013-08-07 DIAGNOSIS — Z950 Presence of cardiac pacemaker: Secondary | ICD-10-CM

## 2013-08-07 DIAGNOSIS — Z5181 Encounter for therapeutic drug level monitoring: Secondary | ICD-10-CM

## 2013-08-07 DIAGNOSIS — I509 Heart failure, unspecified: Secondary | ICD-10-CM

## 2013-08-07 DIAGNOSIS — I4892 Unspecified atrial flutter: Secondary | ICD-10-CM

## 2013-08-07 DIAGNOSIS — I5042 Chronic combined systolic (congestive) and diastolic (congestive) heart failure: Secondary | ICD-10-CM

## 2013-08-07 DIAGNOSIS — I5022 Chronic systolic (congestive) heart failure: Secondary | ICD-10-CM

## 2013-08-07 LAB — POCT INR: INR: 3.3

## 2013-08-07 NOTE — Assessment & Plan Note (Signed)
Her symptoms are class 2B. Will continue her current meds. I have asked her to maintain a low sodium diet.

## 2013-08-07 NOTE — Assessment & Plan Note (Signed)
She has maintained NSR over the past month. She will continue low dose amio.

## 2013-08-07 NOTE — Progress Notes (Signed)
HPI [name] returns today for followup. She is a patient of Dr. Doug Sou. She has a history of tachycardia mediated cardiomyopathy, and was thought to have chronic atrial fibrillation. She underwent AV node ablation and biventricular pacemaker insertion one year ago. Her ejection fraction improved from 30% to 45%. Her heart her symptoms also improved. Over the last few months, she has had some increasing dyspnea and worsening peripheral edema. Interestingly enough, review of her histograms of her pacemaker demonstrates that she in fact goes back into sinus rhythm for months at a time. Over the last 3 months however she has been in atrial fibrillation. The patient has not had syncope. She admits to some dietary indiscretion.  Previous attempts to maintain her in sinus rhythm were unsuccessful. I restarted the patient on amiodarone 2 months ago. She has tolerated this nicely.  Allergies  Allergen Reactions  . Betadine [Povidone Iodine] Itching  . Capoten [Captopril] Other (See Comments)    unknown  . Codone [Hydrocodone] Nausea And Vomiting  . Methadone Nausea And Vomiting  . Penicillins Swelling  . Propoxyphene And Methadone     Intolerance to darvocet     Current Outpatient Prescriptions  Medication Sig Dispense Refill  . amiodarone (PACERONE) 200 MG tablet Take 0.5 tablets (100 mg total) by mouth daily.  45 tablet  3  . amitriptyline (ELAVIL) 25 MG tablet Take 75 mg by mouth at bedtime.        Marland Kitchen atorvastatin (LIPITOR) 40 MG tablet Take 40 mg by mouth every evening.       . diltiazem (CARDIZEM CD) 240 MG 24 hr capsule Take 1 capsule (240 mg total) by mouth daily.  30 capsule  11  . donepezil (ARICEPT) 5 MG tablet Take 5 mg by mouth at bedtime.      Marland Kitchen esomeprazole (NEXIUM) 40 MG capsule Take 40 mg by mouth daily before breakfast.        . ezetimibe (ZETIA) 10 MG tablet Take 10 mg by mouth every evening.       . furosemide (LASIX) 40 MG tablet Take 1.5 tablets (60 mg total) by  mouth every morning.  30 tablet  6  . insulin glargine (LANTUS) 100 UNIT/ML injection Inject 35 Units into the skin 2 (two) times daily.      . insulin lispro (HUMALOG) 100 UNIT/ML injection Inject 0-20 Units into the skin 2 (two) times daily as needed for high blood sugar. Home sliding scale If blood sugar is <200 take 15 units If blood sugar is >200 take 20 units If blood sugar is less than 70, don't take any      . levothyroxine (SYNTHROID, LEVOTHROID) 100 MCG tablet Take 100 mcg by mouth every morning.       . metoprolol succinate (TOPROL-XL) 50 MG 24 hr tablet Take 1 tablet (50 mg total) by mouth every morning. Take with or immediately following a meal.  30 tablet  3  . Multiple Vitamins-Minerals (EYE VITAMINS PO) Take 1 tablet by mouth 2 (two) times daily.       . nitroGLYCERIN (NITROSTAT) 0.4 MG SL tablet Place 0.4 mg under the tongue every 5 (five) minutes as needed. For chest pain      . potassium chloride SA (K-DUR,KLOR-CON) 20 MEQ tablet Take 1 tablet (20 mEq total) by mouth every morning.  30 tablet  3  . warfarin (COUMADIN) 5 MG tablet Take as directed by coumadin clinic  70 tablet  3  . [DISCONTINUED]  enoxaparin (LOVENOX) 100 MG/ML SOLN Inject into the skin every 12 (twelve) hours.        . [DISCONTINUED] simvastatin (ZOCOR) 80 MG tablet Take 80 mg by mouth at bedtime.         No current facility-administered medications for this visit.     Past Medical History  Diagnosis Date  . Coronary artery disease     a. Severe two vessel; s/p CABG (LIMA-LAD, SVG-diag, SVG-PDA);  b. 05/2012 NSTEMI in setting of rapid aflutter;  c. 06/2012 Lexiscan cardiolite: small, mild reversible defect in lateral wall->mild ischemia, low-risk->med Rx.  Marland Kitchen Hypertension   . Type II diabetes mellitus   . Hyperlipidemia   . Diabetic neuropathy   . CVA (cerebral vascular accident) 2004  . Hypothyroidism   . Chronic anemia   . GERD (gastroesophageal reflux disease)   . Arthritis   . Memory loss, short  term   . COPD (chronic obstructive pulmonary disease)   . Blood transfusion without reported diagnosis   . Chronic combined systolic and diastolic CHF (congestive heart failure)     a. 07/2012 Echo: EF 30%, Gr II DD, Mild AS/MR, Mod-Sev TR, mild bi-atrial and RV dil, PASP 70mmHg.  . Moderate mitral regurgitation     a. mod by TEE 2012, mild by echo 2013, 07/2012  . Severe tricuspid regurgitation     a. Severe by TEE 2012, mod by echo 2013, mod-sev by echo 07/2012  . Chronic vulvitis 07/1993  . Idiopathic anemia 2006  . Atrial fibrillation     a. Failed amiodarone (prolonged QT), not a candidate for class IC meds due to her CAD. No Multaq due to CHF hx;  b. 07/2012 s/p SJM Anthem RF Bi-V PPM, ser # [id number] and AV node RFCA;  c. chronic coumadin.  . Atrial flutter   . Pacemaker   . GI bleed     a. 10/2011 - Dr Benson Norway did scope which showed hiatal hernia, colon revealed diverticula (diverticular bleed), internal/ext hemorrhoids.    ROS:   All systems reviewed and negative except as noted in the HPI.   Past Surgical History  Procedure Laterality Date  . Coronary artery bypass graft  06/2009    X3, LIMA to LAD, SVG to diagonal, SVG to the posterior descending artery.  . Tonsillectomy    . Knee arthroscopy  08/2001  . Transthoracic echocardiogram  11/12/2010     Ejection fraction felt to be around 50%.  Wall thickness was increased in a pattern of mild LVH.  Moderately dilated left atrium.  Mildly dilated right atrium. Right ventricle was mildly dilated with mildly reduced systolic function  . Cardiac catheterization  06/09/2009    inferior wall hypokinesia with ejection fraction of 55%.  . Abdominal hysterectomy    . Joint replacement    . Tee without cardioversion  05/23/2011    Procedure: TRANSESOPHAGEAL ECHOCARDIOGRAM (TEE);  Surgeon: Lelon Perla, MD;  Location: [facility];  Service: Cardiovascular;  Laterality: N/A;  . Incision and drainage abscess  05/07/2012    Procedure: INCISION  AND DRAINAGE ABSCESS;  Surgeon: Adin Hector, MD;  Location: [facility];  Service: General;  Laterality: N/A;  Groin wound  . Total abdominal hysterectomy  1979  . Breast biopsy  Left  . Bilateral salpingectomy  10/2000  . Breast biopsy  09/18/2009    fibrocystic change  . Coronary artery bypass graft  06/2009  . Pacemaker insertion  08/2012  . Colonoscopy N/A 01/19/2013    Procedure: COLONOSCOPY;  Surgeon: Inda Castle, MD;  Location: [facility];  Service: Endoscopy;  Laterality: N/A;  . Esophagogastroduodenoscopy N/A 01/19/2013    Procedure: ESOPHAGOGASTRODUODENOSCOPY (EGD);  Surgeon: Inda Castle, MD;  Location: [facility];  Service: Endoscopy;  Laterality: N/A;     Family History  Problem Relation Age of Onset  . Colon cancer Mother   . Stroke Mother   . Hypertension Mother   . Cancer Mother     breast  . Cancer Father     colon  . Heart attack Father     x2  . Renal Disease Sister     dialysis-twin sister  . Renal Disease Sister     renal bypass     History   Social History  . Marital Status: Married    Spouse Name: N/A    Number of Children: 2  . Years of Education: N/A   Occupational History  . teacher    Social History Main Topics  . Smoking status: Former Smoker    Types: Cigarettes  . Smokeless tobacco: Never Used  . Alcohol Use: No  . Drug Use: No  . Sexual Activity: No     Comment: hysterectomy   Other Topics Concern  . Not on file   Social History Narrative   Lives in [place] with spouse     BP 152/77  Pulse 74  Ht 5\' 3"  (1.6 m)  Wt 228 lb (103.42 kg)  BMI 40.40 kg/m2  Physical Exam:  Well appearing elderly woman, NAD HEENT: Unremarkable Neck:  No JVD, no thyromegally Back:  No CVA tenderness Lungs:  Clear with no wheezes, rales, or rhonchi. HEART:  Regular rate rhythm, no murmurs, no rubs, no clicks Abd:  soft, positive bowel sounds, no organomegally, no rebound, no guarding Ext:  2 plus pulses, 2+ peripheral edema, no  cyanosis, no clubbing Skin:  No rashes no nodules Neuro:  CN II through XII intact, motor grossly intact   DEVICE  Normal device function.  See PaceArt for details.   Assess/Plan:

## 2013-08-07 NOTE — Patient Instructions (Addendum)
Your physician recommends that you continue on your current medications as directed. Please refer to the Current Medication list given to you today.  Remote monitoring is used to monitor your Pacemaker of ICD from home. This monitoring reduces the number of office visits required to check your device to one time per year. It allows [place] to keep an eye on the functioning of your device to ensure it is working properly. You are scheduled for a device check from home on 11/08/13. You may send your transmission at any time that day. If you have a wireless device, the transmission will be sent automatically. After your physician reviews your transmission, you will receive a postcard with your next transmission date.  Your physician wants you to follow-up in: 6 months with Dr. Lovena Le. You will receive a reminder letter in the mail two months in advance. If you don't receive a letter, please call our office to schedule the follow-up appointment.

## 2013-08-08 LAB — MDC_IDC_ENUM_SESS_TYPE_INCLINIC
Battery Remaining Longevity: 24 mo
Battery Voltage: 2.9 V
Brady Statistic RA Percent Paced: 65 %
Brady Statistic RV Percent Paced: 99.96 %
Date Time Interrogation Session: 20150204194357
Implantable Pulse Generator Model: 3210
Implantable Pulse Generator Serial Number: 2853392
Lead Channel Impedance Value: 362.5 Ohm
Lead Channel Impedance Value: 462.5 Ohm
Lead Channel Pacing Threshold Amplitude: 1 V
Lead Channel Pacing Threshold Amplitude: 1 V
Lead Channel Pacing Threshold Amplitude: 1 V
Lead Channel Pacing Threshold Amplitude: 1 V
Lead Channel Pacing Threshold Amplitude: 1.75 V
Lead Channel Pacing Threshold Amplitude: 1.75 V
Lead Channel Pacing Threshold Pulse Width: 0.4 ms
Lead Channel Pacing Threshold Pulse Width: 0.4 ms
Lead Channel Pacing Threshold Pulse Width: 1 ms
Lead Channel Sensing Intrinsic Amplitude: 0.7 mV
Lead Channel Sensing Intrinsic Amplitude: 4 mV
Lead Channel Setting Sensing Sensitivity: 1.5 mV
MDC IDC MSMT LEADCHNL LV PACING THRESHOLD PULSEWIDTH: 1 ms
MDC IDC MSMT LEADCHNL RA PACING THRESHOLD PULSEWIDTH: 0.4 ms
MDC IDC MSMT LEADCHNL RV IMPEDANCE VALUE: 450 Ohm
MDC IDC MSMT LEADCHNL RV PACING THRESHOLD PULSEWIDTH: 0.4 ms
MDC IDC SET LEADCHNL LV PACING AMPLITUDE: 3 V
MDC IDC SET LEADCHNL LV PACING PULSEWIDTH: 1 ms
MDC IDC SET LEADCHNL RA PACING AMPLITUDE: 2 V
MDC IDC SET LEADCHNL RV PACING AMPLITUDE: 2.5 V
MDC IDC SET LEADCHNL RV PACING PULSEWIDTH: 0.4 ms

## 2013-08-19 ENCOUNTER — Telehealth: Payer: Self-pay | Admitting: Internal Medicine

## 2013-08-19 NOTE — Telephone Encounter (Signed)
pt husband called and r/s 2/17 appts to 2/23 due to pending weather.

## 2013-08-20 ENCOUNTER — Ambulatory Visit: Payer: Medicare Other

## 2013-08-20 ENCOUNTER — Other Ambulatory Visit: Payer: Medicare Other

## 2013-08-21 ENCOUNTER — Ambulatory Visit (INDEPENDENT_AMBULATORY_CARE_PROVIDER_SITE_OTHER): Payer: Medicare Other | Admitting: *Deleted

## 2013-08-21 DIAGNOSIS — I4891 Unspecified atrial fibrillation: Secondary | ICD-10-CM

## 2013-08-21 DIAGNOSIS — Z5181 Encounter for therapeutic drug level monitoring: Secondary | ICD-10-CM

## 2013-08-21 DIAGNOSIS — Z7901 Long term (current) use of anticoagulants: Secondary | ICD-10-CM

## 2013-08-21 LAB — POCT INR: INR: 1.7

## 2013-08-26 ENCOUNTER — Other Ambulatory Visit (HOSPITAL_BASED_OUTPATIENT_CLINIC_OR_DEPARTMENT_OTHER): Payer: Medicare Other

## 2013-08-26 ENCOUNTER — Ambulatory Visit (HOSPITAL_BASED_OUTPATIENT_CLINIC_OR_DEPARTMENT_OTHER): Payer: Medicare Other | Admitting: Internal Medicine

## 2013-08-26 ENCOUNTER — Encounter: Payer: Self-pay | Admitting: Internal Medicine

## 2013-08-26 VITALS — BP 141/67 | HR 77 | Temp 97.7°F | Resp 18 | Ht 63.0 in | Wt 224.5 lb

## 2013-08-26 DIAGNOSIS — D631 Anemia in chronic kidney disease: Secondary | ICD-10-CM

## 2013-08-26 DIAGNOSIS — I4891 Unspecified atrial fibrillation: Secondary | ICD-10-CM

## 2013-08-26 DIAGNOSIS — D472 Monoclonal gammopathy: Secondary | ICD-10-CM

## 2013-08-26 DIAGNOSIS — N189 Chronic kidney disease, unspecified: Secondary | ICD-10-CM

## 2013-08-26 DIAGNOSIS — N039 Chronic nephritic syndrome with unspecified morphologic changes: Secondary | ICD-10-CM

## 2013-08-26 DIAGNOSIS — I2510 Atherosclerotic heart disease of native coronary artery without angina pectoris: Secondary | ICD-10-CM

## 2013-08-26 LAB — COMPREHENSIVE METABOLIC PANEL (CC13)
ALBUMIN: 3.4 g/dL — AB (ref 3.5–5.0)
ALK PHOS: 115 U/L (ref 40–150)
ALT: 23 U/L (ref 0–55)
AST: 29 U/L (ref 5–34)
Anion Gap: 10 mEq/L (ref 3–11)
BUN: 25.9 mg/dL (ref 7.0–26.0)
CALCIUM: 9.2 mg/dL (ref 8.4–10.4)
CO2: 23 mEq/L (ref 22–29)
Chloride: 107 mEq/L (ref 98–109)
Creatinine: 1.7 mg/dL — ABNORMAL HIGH (ref 0.6–1.1)
Glucose: 229 mg/dl — ABNORMAL HIGH (ref 70–140)
POTASSIUM: 4.2 meq/L (ref 3.5–5.1)
Sodium: 140 mEq/L (ref 136–145)
Total Bilirubin: 0.46 mg/dL (ref 0.20–1.20)
Total Protein: 8 g/dL (ref 6.4–8.3)

## 2013-08-26 LAB — CBC WITH DIFFERENTIAL/PLATELET
BASO%: 0.9 % (ref 0.0–2.0)
Basophils Absolute: 0.1 10*3/uL (ref 0.0–0.1)
EOS ABS: 0.1 10*3/uL (ref 0.0–0.5)
EOS%: 1.8 % (ref 0.0–7.0)
HCT: 30.2 % — ABNORMAL LOW (ref 34.8–46.6)
HGB: 9.7 g/dL — ABNORMAL LOW (ref 11.6–15.9)
LYMPH%: 21.2 % (ref 14.0–49.7)
MCH: 29.6 pg (ref 25.1–34.0)
MCHC: 32 g/dL (ref 31.5–36.0)
MCV: 92.5 fL (ref 79.5–101.0)
MONO#: 0.9 10*3/uL (ref 0.1–0.9)
MONO%: 12.6 % (ref 0.0–14.0)
NEUT%: 63.5 % (ref 38.4–76.8)
NEUTROS ABS: 4.6 10*3/uL (ref 1.5–6.5)
PLATELETS: 165 10*3/uL (ref 145–400)
RBC: 3.26 10*6/uL — ABNORMAL LOW (ref 3.70–5.45)
RDW: 15.8 % — AB (ref 11.2–14.5)
WBC: 7.2 10*3/uL (ref 3.9–10.3)
lymph#: 1.5 10*3/uL (ref 0.9–3.3)

## 2013-08-26 LAB — LACTATE DEHYDROGENASE (CC13): LDH: 257 U/L — ABNORMAL HIGH (ref 125–245)

## 2013-08-26 MED ORDER — DARBEPOETIN ALFA-POLYSORBATE 200 MCG/0.4ML IJ SOLN
200.0000 ug | Freq: Once | INTRAMUSCULAR | Status: AC
  Administered 2013-08-26: 200 ug via SUBCUTANEOUS
  Filled 2013-08-26: qty 0.4

## 2013-08-27 NOTE — Progress Notes (Signed)
[facility] OFFICE PROGRESS NOTE  [name], MD [place], [place]. [place] [address]  DIAGNOSIS: MGUS (monoclonal gammopathy of unknown significance) - Plan: CBC with Differential, Basic metabolic panel (Bmet) - [facility]  Anemia associated with chronic renal failure - Plan: ARANESP TREATMENT CONDITION, SCHEDULING COMMUNICATION INJECTION, darbepoetin (ARANESP) injection 200 mcg, SCHEDULING COMMUNICATION LAB  Chief Complaint  Patient presents with  . MGUS (monoclonal gammopathy of unknown significance)    CURRENT THERAPY: Aranesp 200 mcg subcutaneous prn for a hemoglobin less than 11.   INTERVAL HISTORY: [name] 80 y.o. female with a history of IgG lambda MGUS, Anemia associated with chronic renal failure who presents for follow up. She was last seen by me on 07/23/2013. She reports that she saw her cardiology with review of her pacemaker.  She was in normal sinus rhythm the majority of time and was instructed to continue low dose amio.  She reports some blood on the tissue paper.  She denies any recent hospitalizations or emergency room visits. She has received her flu vaccine. Her last feraheme 510 mg was received in August 10, 2012. She continues aranesp 200 mcg subcutaneous every month for a hemogloblin less than 11. She denies fevers of chills or acute shortness of breath. She also denies chest pain.  MEDICAL HISTORY: Past Medical History  Diagnosis Date  . Coronary artery disease     a. Severe two vessel; s/p CABG (LIMA-LAD, SVG-diag, SVG-PDA);  b. 05/2012 NSTEMI in setting of rapid aflutter;  c. 06/2012 Lexiscan cardiolite: small, mild reversible defect in lateral wall->mild ischemia, low-risk->med Rx.  Marland Kitchen Hypertension   . Type II diabetes mellitus   . Hyperlipidemia   . Diabetic neuropathy   . CVA (cerebral vascular accident) 2004  . Hypothyroidism   . Chronic anemia   . GERD (gastroesophageal reflux disease)   .  Arthritis   . Memory loss, short term   . COPD (chronic obstructive pulmonary disease)   . Blood transfusion without reported diagnosis   . Chronic combined systolic and diastolic CHF (congestive heart failure)     a. 07/2012 Echo: EF 30%, Gr II DD, Mild AS/MR, Mod-Sev TR, mild bi-atrial and RV dil, PASP 65mmHg.  . Moderate mitral regurgitation     a. mod by TEE 2012, mild by echo 2013, 07/2012  . Severe tricuspid regurgitation     a. Severe by TEE 2012, mod by echo 2013, mod-sev by echo 07/2012  . Chronic vulvitis 07/1993  . Idiopathic anemia 2006  . Atrial fibrillation     a. Failed amiodarone (prolonged QT), not a candidate for class IC meds due to her CAD. No Multaq due to CHF hx;  b. 07/2012 s/p SJM Anthem RF Bi-V PPM, ser # [id number] and AV node RFCA;  c. chronic coumadin.  . Atrial flutter   . Pacemaker   . GI bleed     a. 10/2011 - Dr Benson Norway did scope which showed hiatal hernia, colon revealed diverticula (diverticular bleed), internal/ext hemorrhoids.    INTERIM HISTORY: has Atrial fibrillation; Hypothyroidism; Chronic anticoagulation; CAD (coronary artery disease); Diastolic dysfunction; Anemia associated with chronic renal failure; Atrial tachycardia; Right heart failure; MGUS (monoclonal gammopathy of unknown significance); GI bleed; CKD (chronic kidney disease) stage 3, GFR 30-59 ml/min; Abscess of right thigh s/p I&D 53/12/6438; Chronic systolic CHF (congestive heart failure); Atrial flutter; Long term (current) use of anticoagulants; Acute on chronic combined systolic and diastolic congestive heart failure, NYHA class 4; Diabetes mellitus; Chronic  anemia; Cardiomyopathy-cause unknown ? rate related v ischemic; Acute respiratory failure; Pulmonary edema; Pleural effusion; Atrial fibrillation with RVR; Physical deconditioning; Fever; Pneumonia; Weakness; Bacteremia due to Escherichia coli; Coronary artery disease; Hx of CABG; Chronic combined systolic and diastolic congestive heart failure;  Diabetes mellitus type 2 with complications; Acute on chronic renal failure; Pacemaker; DOE (dyspnea on exertion); Benign neoplasm of stomach; Adenomatous duodenal polyp; Diverticulosis of colon (without mention of hemorrhage); Symptomatic anemia; and Encounter for therapeutic drug monitoring on her problem list.    ALLERGIES:  is allergic to betadine; capoten; codone; methadone; penicillins; and propoxyphene and methadone.  MEDICATIONS: has a current medication list which includes the following prescription(s): amiodarone, amitriptyline, atorvastatin, diltiazem, donepezil, esomeprazole, ezetimibe, furosemide, insulin glargine, insulin lispro, levothyroxine, metoprolol succinate, multiple vitamins-minerals, nitroglycerin, potassium chloride sa, and warfarin.  SURGICAL HISTORY:  Past Surgical History  Procedure Laterality Date  . Coronary artery bypass graft  06/2009    X3, LIMA to LAD, SVG to diagonal, SVG to the posterior descending artery.  . Tonsillectomy    . Knee arthroscopy  08/2001  . Transthoracic echocardiogram  11/12/2010     Ejection fraction felt to be around 50%.  Wall thickness was increased in a pattern of mild LVH.  Moderately dilated left atrium.  Mildly dilated right atrium. Right ventricle was mildly dilated with mildly reduced systolic function  . Cardiac catheterization  06/09/2009    inferior wall hypokinesia with ejection fraction of 55%.  . Abdominal hysterectomy    . Joint replacement    . Tee without cardioversion  05/23/2011    Procedure: TRANSESOPHAGEAL ECHOCARDIOGRAM (TEE);  Surgeon: Lelon Perla, MD;  Location: [facility];  Service: Cardiovascular;  Laterality: N/A;  . Incision and drainage abscess  05/07/2012    Procedure: INCISION AND DRAINAGE ABSCESS;  Surgeon: Adin Hector, MD;  Location: [facility];  Service: General;  Laterality: N/A;  Groin wound  . Total abdominal hysterectomy  1979  . Breast biopsy  Left  . Bilateral salpingectomy  10/2000  . Breast  biopsy  09/18/2009    fibrocystic change  . Coronary artery bypass graft  06/2009  . Pacemaker insertion  08/2012  . Colonoscopy N/A 01/19/2013    Procedure: COLONOSCOPY;  Surgeon: Inda Castle, MD;  Location: [facility];  Service: Endoscopy;  Laterality: N/A;  . Esophagogastroduodenoscopy N/A 01/19/2013    Procedure: ESOPHAGOGASTRODUODENOSCOPY (EGD);  Surgeon: Inda Castle, MD;  Location: [facility];  Service: Endoscopy;  Laterality: N/A;    REVIEW OF SYSTEMS:   Constitutional: Denies fevers, chills or abnormal weight loss Eyes: Denies blurriness of vision Ears, nose, mouth, throat, and face: Denies mucositis or sore throat Respiratory: Denies cough, dyspnea or wheezes Cardiovascular: Denies palpitation, chest discomfort or lower extremity swelling Gastrointestinal:  Denies nausea, heartburn or change in bowel habits Skin: Denies abnormal skin rashes Lymphatics: Denies new lymphadenopathy or easy bruising Neurological:Denies numbness, tingling or new weaknesses Behavioral/Psych: Mood is stable, no new changes  All other systems were reviewed with the patient and are negative.  PHYSICAL EXAMINATION: ECOG PERFORMANCE STATUS: 1 - Symptomatic but completely ambulatory  Blood pressure 141/67, pulse 77, temperature 97.7 F (36.5 C), temperature source Oral, resp. rate 18, height 5\' 3"  (1.6 m), weight 224 lb 8 oz (101.833 kg), SpO2 99.00%.  GENERAL:alert, no distress and comfortable; moderately obese SKIN: skin color, texture, turgor are normal, no rashes or significant lesions EYES: normal, Conjunctiva are pink and non-injected, sclera clear OROPHARYNX:no exudate, no erythema and lips, buccal mucosa, and tongue  normal  NECK: supple, thyroid normal size, non-tender, without nodularity LYMPH:  no palpable lymphadenopathy in the cervical, axillary or supraclavicular LUNGS: clear to auscultation and percussion with normal breathing effort HEART:  RR with pacemaker in the left  infraclavicular area. Chronic lower extremity brawny edema with stasis changes.  ABDOMEN:abdomen soft, non-tender and normal bowel sounds Musculoskeletal:no cyanosis of digits and no clubbing  NEURO: alert & oriented x 3 with fluent speech, no focal motor/sensory deficits  Labs:  Lab Results  Component Value Date   WBC 7.2 08/26/2013   HGB 9.7* 08/26/2013   HCT 30.2* 08/26/2013   MCV 92.5 08/26/2013   PLT 165 08/26/2013   NEUTROABS 4.6 08/26/2013      Chemistry      Component Value Date/Time   NA 140 08/26/2013 1506   NA 137 01/20/2013 0443   K 4.2 08/26/2013 1506   K 3.5 01/20/2013 0443   CL 100 01/20/2013 0443   CL 105 08/30/2012 1108   CO2 23 08/26/2013 1506   CO2 26 01/20/2013 0443   BUN 25.9 08/26/2013 1506   BUN 24* 01/20/2013 0443   CREATININE 1.7* 08/26/2013 1506   CREATININE 1.63* 01/20/2013 0443   CREATININE 1.68* 05/16/2011 1352      Component Value Date/Time   CALCIUM 9.2 08/26/2013 1506   CALCIUM 8.9 01/20/2013 0443   ALKPHOS 115 08/26/2013 1506   ALKPHOS 145* 08/07/2012 0445   AST 29 08/26/2013 1506   AST 29 08/07/2012 0445   ALT 23 08/26/2013 1506   ALT 17 08/07/2012 0445   BILITOT 0.46 08/26/2013 1506   BILITOT 0.4 08/07/2012 0445     Basic Metabolic Panel:  Recent Labs Lab 08/26/13 1506  NA 140  K 4.2  CO2 23  GLUCOSE 229*  BUN 25.9  CREATININE 1.7*  CALCIUM 9.2   GFR Estimated Creatinine Clearance: 31.1 ml/min (by C-G formula based on Cr of 1.7). Liver Function Tests:  Recent Labs Lab 08/26/13 1506  AST 29  ALT 23  ALKPHOS 115  BILITOT 0.46  PROT 8.0  ALBUMIN 3.4*   CBC:  Recent Labs Lab 08/26/13 1506  WBC 7.2  NEUTROABS 4.6  HGB 9.7*  HCT 30.2*  MCV 92.5  PLT 165   Studies:  No results found.   RADIOGRAPHIC STUDIES: No results found.  ASSESSMENT: [name] 80 y.o. female with a history of MGUS (monoclonal gammopathy of unknown significance) - Plan: CBC with Differential, Basic metabolic panel (Bmet) - [facility]  Anemia associated  with chronic renal failure - Plan: ARANESP TREATMENT CONDITION, SCHEDULING COMMUNICATION INJECTION, darbepoetin (ARANESP) injection 200 mcg, SCHEDULING COMMUNICATION LAB   PLAN:  1. Anemia of chronic renal failure.  --She feels at her baseline.   --Today, her hemoglobin is 9.7 up from 9.3 so she will  receive her aranesp 200 mcg subcutaneous shot. She will continue to have CBCs every 4 weeks and have aranesp as indicated.  Her history is also notable for angiodyplasia and diverticulosis resulting in heme positive stool.    2. Afib/CHF/Pacer --Followed by cardiology.  Her medtronic biventricular pacemaker was working normally on 12/09.  She was started on amiodarone 100 mg daily and will continue at that dose.  3. CKDz. --Her creatinine is 1.7 today.    4. MGUS. --Her IgG levels remains stable. She denies any symptoms including bone pain.  5. Follow-up.   --patient will return to clinic in one month for CBC, CMP and iron studies.   She had a colonoscopy  With  Dr. Sandy Salaam. Kaplan on 01/19/2013.  If blood on tissue paper worsens, we will refer back to Dr. Deatra Ina.   All questions were answered. The patient knows to call the clinic with any problems, questions or concerns. We can certainly see the patient much sooner if necessary.  I spent 15 minutes counseling the patient face to face. The total time spent in the appointment was 25 minutes.    Jerry Clyne, MD 08/27/2013 6:14 AM

## 2013-09-05 ENCOUNTER — Ambulatory Visit (INDEPENDENT_AMBULATORY_CARE_PROVIDER_SITE_OTHER): Payer: Medicare Other

## 2013-09-05 DIAGNOSIS — Z5181 Encounter for therapeutic drug level monitoring: Secondary | ICD-10-CM

## 2013-09-05 DIAGNOSIS — I4891 Unspecified atrial fibrillation: Secondary | ICD-10-CM

## 2013-09-05 DIAGNOSIS — Z7901 Long term (current) use of anticoagulants: Secondary | ICD-10-CM

## 2013-09-05 LAB — POCT INR: INR: 1.7

## 2013-09-18 ENCOUNTER — Ambulatory Visit (INDEPENDENT_AMBULATORY_CARE_PROVIDER_SITE_OTHER): Payer: 59 | Admitting: Podiatry

## 2013-09-18 ENCOUNTER — Encounter: Payer: Self-pay | Admitting: Podiatry

## 2013-09-18 ENCOUNTER — Encounter: Payer: Medicare Other | Admitting: Internal Medicine

## 2013-09-18 VITALS — BP 152/74 | HR 70 | Resp 16 | Ht 62.0 in | Wt 210.0 lb

## 2013-09-18 DIAGNOSIS — E1049 Type 1 diabetes mellitus with other diabetic neurological complication: Secondary | ICD-10-CM

## 2013-09-18 DIAGNOSIS — B351 Tinea unguium: Secondary | ICD-10-CM

## 2013-09-18 NOTE — Patient Instructions (Signed)
Diabetes and Foot Care Diabetes may cause you to have problems because of poor blood supply (circulation) to your feet and legs. This may cause the skin on your feet to become thinner, break easier, and heal more slowly. Your skin may become dry, and the skin may peel and crack. You may also have nerve damage in your legs and feet causing decreased feeling in them. You may not notice minor injuries to your feet that could lead to infections or more serious problems. Taking care of your feet is one of the most important things you can do for yourself.  HOME CARE INSTRUCTIONS  Wear shoes at all times, even in the house. Do not go barefoot. Bare feet are easily injured.  Check your feet daily for blisters, cuts, and redness. If you cannot see the bottom of your feet, use a mirror or ask someone for help.  Wash your feet with warm water (do not use hot water) and mild soap. Then pat your feet and the areas between your toes until they are completely dry. Do not soak your feet as this can dry your skin.  Apply a moisturizing lotion or petroleum jelly (that does not contain alcohol and is unscented) to the skin on your feet and to dry, brittle toenails. Do not apply lotion between your toes.  Trim your toenails straight across. Do not dig under them or around the cuticle. File the edges of your nails with an emery board or nail file.  Do not cut corns or calluses or try to remove them with medicine.  Wear clean socks or stockings every day. Make sure they are not too tight. Do not wear knee-high stockings since they may decrease blood flow to your legs.  Wear shoes that fit properly and have enough cushioning. To break in new shoes, wear them for just a few hours a day. This prevents you from injuring your feet. Always look in your shoes before you put them on to be sure there are no objects inside.  Do not cross your legs. This may decrease the blood flow to your feet.  If you find a minor scrape,  cut, or break in the skin on your feet, keep it and the skin around it clean and dry. These areas may be cleansed with mild soap and water. Do not cleanse the area with peroxide, alcohol, or iodine.  When you remove an adhesive bandage, be sure not to damage the skin around it.  If you have a wound, look at it several times a day to make sure it is healing.  Do not use heating pads or hot water bottles. They may burn your skin. If you have lost feeling in your feet or legs, you may not know it is happening until it is too late.  Make sure your health care provider performs a complete foot exam at least annually or more often if you have foot problems. Report any cuts, sores, or bruises to your health care provider immediately. SEEK MEDICAL CARE IF:   You have an injury that is not healing.  You have cuts or breaks in the skin.  You have an ingrown nail.  You notice redness on your legs or feet.  You feel burning or tingling in your legs or feet.  You have pain or cramps in your legs and feet.  Your legs or feet are numb.  Your feet always feel cold. SEEK IMMEDIATE MEDICAL CARE IF:   There is increasing redness,   swelling, or pain in or around a wound.  There is a red line that goes up your leg.  Pus is coming from a wound.  You develop a fever or as directed by your health care provider.  You notice a bad smell coming from an ulcer or wound. Document Released: 06/17/2000 Document Revised: 02/20/2013 Document Reviewed: 11/27/2012 [employer] Patient Information 2014 [employer].  

## 2013-09-18 NOTE — Progress Notes (Signed)
   Subjective:    Patient ID: [name], female    DOB: 1932/07/12, 80 y.o.   MRN: [id number]  HPI Comments: "I need the toenails cut"  Patient states she needs her toenails trimmed and wonders about the discoloration on medial ankles.  The last visit in our office for this patient was 01/13/2011.   Review of Systems  Cardiovascular: Positive for palpitations.  Genitourinary: Positive for frequency and hematuria.  Skin: Positive for color change.       Change in nails Thick scars   Hematological: Bruises/bleeds easily.  Psychiatric/Behavioral: Positive for confusion.  All other systems reviewed and are negative.       Objective:   Physical Exam  This patient appears to be orientated x3  Vascular: DP and PT pulses are 2/4 bilaterally  Neurological: Sensation to 10 g monofilament wire intact 0/5 bilaterally. Vibratory sensation nonreactive bilaterally.  Dermatological: Brawny type edema lower extremity and ankles with hyper pigmentation noted bilaterally. All 10 toenails are hypertrophic, elongated, discolored, incurvated.  Musculoskeletal HAV deformity left noted        Assessment & Plan:   Assessment: Diabetic peripheral neuropathy Loss of protective sensation bilaterally Onychomycoses with extreme neglect x10 HAV deformity left  Plan: Nails x10 are debrided without any bleeding. Diabetic footcare information provided to patient at discharge. Reappoint at three-month intervals for exam and nail debridement.

## 2013-09-19 ENCOUNTER — Encounter: Payer: Self-pay | Admitting: Podiatry

## 2013-09-19 ENCOUNTER — Ambulatory Visit (INDEPENDENT_AMBULATORY_CARE_PROVIDER_SITE_OTHER): Payer: Medicare Other | Admitting: Pharmacist

## 2013-09-19 DIAGNOSIS — Z5181 Encounter for therapeutic drug level monitoring: Secondary | ICD-10-CM

## 2013-09-19 DIAGNOSIS — Z7901 Long term (current) use of anticoagulants: Secondary | ICD-10-CM

## 2013-09-19 DIAGNOSIS — I4891 Unspecified atrial fibrillation: Secondary | ICD-10-CM

## 2013-09-19 LAB — POCT INR: INR: 3.1

## 2013-09-27 ENCOUNTER — Other Ambulatory Visit: Payer: Medicare Other

## 2013-09-27 ENCOUNTER — Ambulatory Visit: Payer: Medicare Other

## 2013-09-27 ENCOUNTER — Ambulatory Visit (HOSPITAL_BASED_OUTPATIENT_CLINIC_OR_DEPARTMENT_OTHER): Payer: Medicare Other

## 2013-09-27 ENCOUNTER — Telehealth: Payer: Self-pay | Admitting: Internal Medicine

## 2013-09-27 VITALS — BP 157/69 | HR 72 | Temp 98.1°F

## 2013-09-27 DIAGNOSIS — N189 Chronic kidney disease, unspecified: Secondary | ICD-10-CM

## 2013-09-27 DIAGNOSIS — D631 Anemia in chronic kidney disease: Secondary | ICD-10-CM

## 2013-09-27 DIAGNOSIS — N039 Chronic nephritic syndrome with unspecified morphologic changes: Secondary | ICD-10-CM

## 2013-09-27 DIAGNOSIS — N183 Chronic kidney disease, stage 3 unspecified: Secondary | ICD-10-CM

## 2013-09-27 DIAGNOSIS — D472 Monoclonal gammopathy: Secondary | ICD-10-CM

## 2013-09-27 LAB — COMPREHENSIVE METABOLIC PANEL (CC13)
ALBUMIN: 3.2 g/dL — AB (ref 3.5–5.0)
ALK PHOS: 125 U/L (ref 40–150)
ALT: 23 U/L (ref 0–55)
AST: 29 U/L (ref 5–34)
Anion Gap: 10 mEq/L (ref 3–11)
BUN: 28.4 mg/dL — ABNORMAL HIGH (ref 7.0–26.0)
CO2: 21 meq/L — AB (ref 22–29)
Calcium: 9 mg/dL (ref 8.4–10.4)
Chloride: 107 mEq/L (ref 98–109)
Creatinine: 1.5 mg/dL — ABNORMAL HIGH (ref 0.6–1.1)
Glucose: 143 mg/dl — ABNORMAL HIGH (ref 70–140)
POTASSIUM: 4.4 meq/L (ref 3.5–5.1)
SODIUM: 139 meq/L (ref 136–145)
TOTAL PROTEIN: 7.9 g/dL (ref 6.4–8.3)
Total Bilirubin: 0.34 mg/dL (ref 0.20–1.20)

## 2013-09-27 LAB — CBC WITH DIFFERENTIAL/PLATELET
BASO%: 0.6 % (ref 0.0–2.0)
Basophils Absolute: 0 10*3/uL (ref 0.0–0.1)
EOS ABS: 0.1 10*3/uL (ref 0.0–0.5)
EOS%: 2.1 % (ref 0.0–7.0)
HCT: 29 % — ABNORMAL LOW (ref 34.8–46.6)
HGB: 9.4 g/dL — ABNORMAL LOW (ref 11.6–15.9)
LYMPH%: 18.4 % (ref 14.0–49.7)
MCH: 29.3 pg (ref 25.1–34.0)
MCHC: 32.5 g/dL (ref 31.5–36.0)
MCV: 90.3 fL (ref 79.5–101.0)
MONO#: 1 10*3/uL — AB (ref 0.1–0.9)
MONO%: 15.8 % — AB (ref 0.0–14.0)
NEUT#: 3.8 10*3/uL (ref 1.5–6.5)
NEUT%: 63.1 % (ref 38.4–76.8)
Platelets: 148 10*3/uL (ref 145–400)
RBC: 3.22 10*6/uL — AB (ref 3.70–5.45)
RDW: 15.1 % — AB (ref 11.2–14.5)
WBC: 6.1 10*3/uL (ref 3.9–10.3)
lymph#: 1.1 10*3/uL (ref 0.9–3.3)

## 2013-09-27 MED ORDER — DARBEPOETIN ALFA-POLYSORBATE 200 MCG/0.4ML IJ SOLN
200.0000 ug | Freq: Once | INTRAMUSCULAR | Status: AC
  Administered 2013-09-27: 200 ug via SUBCUTANEOUS
  Filled 2013-09-27: qty 0.4

## 2013-09-27 NOTE — Telephone Encounter (Signed)
PT CAME IN AT [facility] TIME. PER DESK NURSE PT SENT FOR LB/INJ AND GIVEN F/U APPT FOR 4/10.

## 2013-09-28 LAB — IGG, IGA, IGM
IGA: 273 mg/dL (ref 69–380)
IGM, SERUM: 140 mg/dL (ref 52–322)
IgG (Immunoglobin G), Serum: 1990 mg/dL — ABNORMAL HIGH (ref 690–1700)

## 2013-09-30 LAB — IRON AND TIBC CHCC
%SAT: 23 % (ref 21–57)
Iron: 55 ug/dL (ref 41–142)
TIBC: 240 ug/dL (ref 236–444)
UIBC: 185 ug/dL (ref 120–384)

## 2013-09-30 LAB — FERRITIN CHCC: FERRITIN: 141 ng/mL (ref 9–269)

## 2013-10-02 ENCOUNTER — Other Ambulatory Visit: Payer: Self-pay

## 2013-10-02 MED ORDER — METOPROLOL SUCCINATE ER 50 MG PO TB24: 50.0000 mg | ORAL_TABLET | Freq: Every morning | ORAL | Status: DC

## 2013-10-02 MED ORDER — FUROSEMIDE 40 MG PO TABS: 60.0000 mg | ORAL_TABLET | Freq: Every morning | ORAL | Status: DC

## 2013-10-03 ENCOUNTER — Ambulatory Visit (INDEPENDENT_AMBULATORY_CARE_PROVIDER_SITE_OTHER): Payer: Medicare Other | Admitting: *Deleted

## 2013-10-03 DIAGNOSIS — Z7901 Long term (current) use of anticoagulants: Secondary | ICD-10-CM

## 2013-10-03 DIAGNOSIS — Z5181 Encounter for therapeutic drug level monitoring: Secondary | ICD-10-CM

## 2013-10-03 DIAGNOSIS — I4891 Unspecified atrial fibrillation: Secondary | ICD-10-CM

## 2013-10-03 LAB — POCT INR: INR: 1.3

## 2013-10-11 ENCOUNTER — Encounter: Payer: Self-pay | Admitting: Internal Medicine

## 2013-10-11 ENCOUNTER — Ambulatory Visit (HOSPITAL_BASED_OUTPATIENT_CLINIC_OR_DEPARTMENT_OTHER): Payer: Medicare Other | Admitting: Internal Medicine

## 2013-10-11 ENCOUNTER — Telehealth: Payer: Self-pay | Admitting: Internal Medicine

## 2013-10-11 ENCOUNTER — Ambulatory Visit (HOSPITAL_BASED_OUTPATIENT_CLINIC_OR_DEPARTMENT_OTHER): Payer: Medicare Other

## 2013-10-11 VITALS — BP 136/62 | HR 73 | Temp 100.0°F | Resp 18 | Ht 62.0 in | Wt 230.6 lb

## 2013-10-11 DIAGNOSIS — N189 Chronic kidney disease, unspecified: Secondary | ICD-10-CM

## 2013-10-11 DIAGNOSIS — K5730 Diverticulosis of large intestine without perforation or abscess without bleeding: Secondary | ICD-10-CM

## 2013-10-11 DIAGNOSIS — D649 Anemia, unspecified: Secondary | ICD-10-CM

## 2013-10-11 DIAGNOSIS — D472 Monoclonal gammopathy: Secondary | ICD-10-CM

## 2013-10-11 DIAGNOSIS — N039 Chronic nephritic syndrome with unspecified morphologic changes: Secondary | ICD-10-CM

## 2013-10-11 DIAGNOSIS — D631 Anemia in chronic kidney disease: Secondary | ICD-10-CM

## 2013-10-11 DIAGNOSIS — Z7901 Long term (current) use of anticoagulants: Secondary | ICD-10-CM

## 2013-10-11 LAB — CBC WITH DIFFERENTIAL/PLATELET
BASO%: 0.3 % (ref 0.0–2.0)
Basophils Absolute: 0 10*3/uL (ref 0.0–0.1)
EOS%: 2 % (ref 0.0–7.0)
Eosinophils Absolute: 0.2 10*3/uL (ref 0.0–0.5)
HCT: 27.1 % — ABNORMAL LOW (ref 34.8–46.6)
HEMOGLOBIN: 8.7 g/dL — AB (ref 11.6–15.9)
LYMPH%: 15.9 % (ref 14.0–49.7)
MCH: 29.3 pg (ref 25.1–34.0)
MCHC: 32 g/dL (ref 31.5–36.0)
MCV: 91.6 fL (ref 79.5–101.0)
MONO#: 1.2 10*3/uL — ABNORMAL HIGH (ref 0.1–0.9)
MONO%: 14 % (ref 0.0–14.0)
NEUT#: 5.8 10*3/uL (ref 1.5–6.5)
NEUT%: 67.8 % (ref 38.4–76.8)
Platelets: 171 10*3/uL (ref 145–400)
RBC: 2.96 10*6/uL — AB (ref 3.70–5.45)
RDW: 16.6 % — AB (ref 11.2–14.5)
WBC: 8.5 10*3/uL (ref 3.9–10.3)
lymph#: 1.4 10*3/uL (ref 0.9–3.3)

## 2013-10-11 NOTE — Telephone Encounter (Signed)
Gave pt appt for lab ,md and injections for APril 2015 , pt sent to labs today

## 2013-10-13 NOTE — Progress Notes (Signed)
[facility] OFFICE PROGRESS NOTE  [name], MD [place], [place]. [place] [address]  DIAGNOSIS: Symptomatic anemia - Plan: CBC with Differential, CBC with Differential, Comprehensive metabolic panel (Cmet) - [facility]  MGUS (monoclonal gammopathy of unknown significance)  Chronic anticoagulation - Plan: CBC with Differential, CBC with Differential, Comprehensive metabolic panel (Cmet) - [facility]  Diverticulosis of colon (without mention of hemorrhage)  Chief Complaint  Patient presents with  . MGUS (monoclonal gammopathy of unknown significance)    CURRENT THERAPY: Aranesp 200 mcg subcutaneous prn for a hemoglobin less than 11.   INTERVAL HISTORY: [name] 80 y.o. female with a history of IgG lambda MGUS, Anemia associated with chronic renal failure who presents for follow up. She was last seen by me on 08/26/2013. Today, she is accompanied by her husband.  She denies any recent hospitalizations or emergency room visits. She reports some rectal bleeding for which she attributes to her hemorrhoids.  She was instructed to hold her anticoagulation in the setting of her bleeding.  She reports resolution but now endorses worsening fatigue with ambulation from room to room within her house. Her last feraheme 510 mg was received in August 10, 2012. She continues aranesp 200 mcg subcutaneous every month for a hemogloblin less than 11. She denies fevers of chills or acute shortness of breath. She also denies chest pain.  MEDICAL HISTORY: Past Medical History  Diagnosis Date  . Coronary artery disease     a. Severe two vessel; s/p CABG (LIMA-LAD, SVG-diag, SVG-PDA);  b. 05/2012 NSTEMI in setting of rapid aflutter;  c. 06/2012 Lexiscan cardiolite: small, mild reversible defect in lateral wall->mild ischemia, low-risk->med Rx.  Marland Kitchen Hypertension   . Type II diabetes mellitus   . Hyperlipidemia   . Diabetic neuropathy   . CVA (cerebral vascular  accident) 2004  . Hypothyroidism   . Chronic anemia   . GERD (gastroesophageal reflux disease)   . Arthritis   . Memory loss, short term   . COPD (chronic obstructive pulmonary disease)   . Blood transfusion without reported diagnosis   . Chronic combined systolic and diastolic CHF (congestive heart failure)     a. 07/2012 Echo: EF 30%, Gr II DD, Mild AS/MR, Mod-Sev TR, mild bi-atrial and RV dil, PASP 41mmHg.  . Moderate mitral regurgitation     a. mod by TEE 2012, mild by echo 2013, 07/2012  . Severe tricuspid regurgitation     a. Severe by TEE 2012, mod by echo 2013, mod-sev by echo 07/2012  . Chronic vulvitis 07/1993  . Idiopathic anemia 2006  . Atrial fibrillation     a. Failed amiodarone (prolonged QT), not a candidate for class IC meds due to her CAD. No Multaq due to CHF hx;  b. 07/2012 s/p SJM Anthem RF Bi-V PPM, ser # [id number] and AV node RFCA;  c. chronic coumadin.  . Atrial flutter   . Pacemaker   . GI bleed     a. 10/2011 - Dr Benson Norway did scope which showed hiatal hernia, colon revealed diverticula (diverticular bleed), internal/ext hemorrhoids.    INTERIM HISTORY: has Atrial fibrillation; Hypothyroidism; Chronic anticoagulation; CAD (coronary artery disease); Diastolic dysfunction; Anemia associated with chronic renal failure; Atrial tachycardia; Right heart failure; MGUS (monoclonal gammopathy of unknown significance); GI bleed; CKD (chronic kidney disease) stage 3, GFR 30-59 ml/min; Abscess of right thigh s/p I&D 76/01/2093; Chronic systolic CHF (congestive heart failure); Atrial flutter; Long term (current) use of anticoagulants; Acute on chronic  combined systolic and diastolic congestive heart failure, NYHA class 4; Diabetes mellitus; Chronic anemia; Cardiomyopathy-cause unknown ? rate related v ischemic; Acute respiratory failure; Pulmonary edema; Pleural effusion; Atrial fibrillation with RVR; Physical deconditioning; Fever; Pneumonia; Weakness; Bacteremia due to Escherichia coli;  Coronary artery disease; Hx of CABG; Chronic combined systolic and diastolic congestive heart failure; Diabetes mellitus type 2 with complications; Acute on chronic renal failure; Pacemaker; DOE (dyspnea on exertion); Benign neoplasm of stomach; Adenomatous duodenal polyp; Diverticulosis of colon (without mention of hemorrhage); Symptomatic anemia; and Encounter for therapeutic drug monitoring on her problem list.    ALLERGIES:  is allergic to betadine; capoten; codone; methadone; penicillins; and propoxyphene and methadone.  MEDICATIONS: has a current medication list which includes the following prescription(s): amiodarone, amitriptyline, atorvastatin, diltiazem, donepezil, esomeprazole, ezetimibe, furosemide, insulin glargine, insulin lispro, levothyroxine, metoprolol succinate, multiple vitamins-minerals, nitroglycerin, potassium chloride sa, and warfarin.  SURGICAL HISTORY:  Past Surgical History  Procedure Laterality Date  . Coronary artery bypass graft  06/2009    X3, LIMA to LAD, SVG to diagonal, SVG to the posterior descending artery.  . Tonsillectomy    . Knee arthroscopy  08/2001  . Transthoracic echocardiogram  11/12/2010     Ejection fraction felt to be around 50%.  Wall thickness was increased in a pattern of mild LVH.  Moderately dilated left atrium.  Mildly dilated right atrium. Right ventricle was mildly dilated with mildly reduced systolic function  . Cardiac catheterization  06/09/2009    inferior wall hypokinesia with ejection fraction of 55%.  . Abdominal hysterectomy    . Joint replacement    . Tee without cardioversion  05/23/2011    Procedure: TRANSESOPHAGEAL ECHOCARDIOGRAM (TEE);  Surgeon: Lelon Perla, MD;  Location: [facility];  Service: Cardiovascular;  Laterality: N/A;  . Incision and drainage abscess  05/07/2012    Procedure: INCISION AND DRAINAGE ABSCESS;  Surgeon: Adin Hector, MD;  Location: [facility];  Service: General;  Laterality: N/A;  Groin wound  . Total  abdominal hysterectomy  1979  . Breast biopsy  Left  . Bilateral salpingectomy  10/2000  . Breast biopsy  09/18/2009    fibrocystic change  . Coronary artery bypass graft  06/2009  . Pacemaker insertion  08/2012  . Colonoscopy N/A 01/19/2013    Procedure: COLONOSCOPY;  Surgeon: Inda Castle, MD;  Location: [facility];  Service: Endoscopy;  Laterality: N/A;  . Esophagogastroduodenoscopy N/A 01/19/2013    Procedure: ESOPHAGOGASTRODUODENOSCOPY (EGD);  Surgeon: Inda Castle, MD;  Location: [facility];  Service: Endoscopy;  Laterality: N/A;    REVIEW OF SYSTEMS:   Constitutional: Denies fevers, chills or abnormal weight loss Eyes: Denies blurriness of vision Ears, nose, mouth, throat, and face: Denies mucositis or sore throat Respiratory: Denies cough, dyspnea or wheezes Cardiovascular: Denies palpitation, chest discomfort or lower extremity swelling Gastrointestinal:  Denies nausea, heartburn or change in bowel habits Skin: Denies abnormal skin rashes Lymphatics: Denies new lymphadenopathy or easy bruising Neurological:Denies numbness, tingling or new weaknesses Behavioral/Psych: Mood is stable, no new changes  All other systems were reviewed with the patient and are negative.  PHYSICAL EXAMINATION: ECOG PERFORMANCE STATUS: 1 - Symptomatic but completely ambulatory  Blood pressure 136/62, pulse 73, temperature 100 F (37.8 C), temperature source Oral, resp. rate 18, height 5\' 2"  (1.575 m), weight 230 lb 9.6 oz (104.599 kg).  GENERAL:alert, no distress and comfortable; moderately obese SKIN: skin color, texture, turgor are normal, no rashes or significant lesions EYES: normal, Conjunctiva are pink and non-injected, sclera  clear OROPHARYNX:no exudate, no erythema and lips, buccal mucosa, and tongue normal  NECK: supple, thyroid normal size, non-tender, without nodularity LYMPH:  no palpable lymphadenopathy in the cervical, axillary or supraclavicular LUNGS: clear to auscultation  and percussion with normal breathing effort HEART:  RR with pacemaker in the left infraclavicular area. Chronic lower extremity brawny edema with stasis changes.  ABDOMEN:abdomen soft, non-tender and normal bowel sounds Musculoskeletal:no cyanosis of digits and no clubbing  NEURO: alert & oriented x 3 with fluent speech, no focal motor/sensory deficits  Labs:  Lab Results  Component Value Date   WBC 8.5 10/11/2013   HGB 8.7* 10/11/2013   HCT 27.1* 10/11/2013   MCV 91.6 10/11/2013   PLT 171 10/11/2013   NEUTROABS 5.8 10/11/2013      Chemistry      Component Value Date/Time   NA 139 09/27/2013 1535   NA 137 01/20/2013 0443   K 4.4 09/27/2013 1535   K 3.5 01/20/2013 0443   CL 100 01/20/2013 0443   CL 105 08/30/2012 1108   CO2 21* 09/27/2013 1535   CO2 26 01/20/2013 0443   BUN 28.4* 09/27/2013 1535   BUN 24* 01/20/2013 0443   CREATININE 1.5* 09/27/2013 1535   CREATININE 1.63* 01/20/2013 0443   CREATININE 1.68* 05/16/2011 1352      Component Value Date/Time   CALCIUM 9.0 09/27/2013 1535   CALCIUM 8.9 01/20/2013 0443   ALKPHOS 125 09/27/2013 1535   ALKPHOS 145* 08/07/2012 0445   AST 29 09/27/2013 1535   AST 29 08/07/2012 0445   ALT 23 09/27/2013 1535   ALT 17 08/07/2012 0445   BILITOT 0.34 09/27/2013 1535   BILITOT 0.4 08/07/2012 0445     CBC:  Recent Labs Lab 10/11/13 1342  WBC 8.5  NEUTROABS 5.8  HGB 8.7*  HCT 27.1*  MCV 91.6  PLT 171   Studies:  No results found.   RADIOGRAPHIC STUDIES: No results found.  ASSESSMENT: [name] 80 y.o. female with a history of Symptomatic anemia - Plan: CBC with Differential, CBC with Differential, Comprehensive metabolic panel (Cmet) - [facility]  MGUS (monoclonal gammopathy of unknown significance)  Chronic anticoagulation - Plan: CBC with Differential, CBC with Differential, Comprehensive metabolic panel (Cmet) - [facility]  Diverticulosis of colon (without mention of hemorrhage)   PLAN:  1. Anemia of chronic renal failure.  --She feels more  symptomatic.   --Today, her hemoglobin is 8.7 down from  9.7 so she will obtain a blood transfusion on 04/15. She received her aranesp 200 mcg subcutaneous shot on 04/03. She will continue to have CBCs every 4 weeks and have aranesp as indicated.  Her history is also notable for angiodyplasia and diverticulosis and hemorrhoids resulting in heme positive stool.    2. Afib/CHF/Pacer --Followed by cardiology.  Her medtronic biventricular pacemaker was working normally on 12/09.  She was started on amiodarone 100 mg daily and will continue at that dose.  3. CKDz. --Her creatinine is 1.5 today.    4. MGUS. --Her IgG levels remains stable. She denies any symptoms including bone pain.  5. Follow-up.   --patient will return to clinic in several weeks for CBC, CMP and iron studies.   She had a colonoscopy  With Dr. Sandy Salaam. Kaplan on 01/19/2013.  If blood on tissue paper worsens, we will refer back to Dr. Deatra Ina.   All questions were answered. The patient knows to call the clinic with any problems, questions or concerns. We can certainly see the patient much  sooner if necessary.  I spent 15 minutes counseling the patient face to face. The total time spent in the appointment was 25 minutes.    Concha Norway, MD 10/13/2013 7:15 PM

## 2013-10-14 ENCOUNTER — Ambulatory Visit (INDEPENDENT_AMBULATORY_CARE_PROVIDER_SITE_OTHER): Payer: Medicare Other

## 2013-10-14 ENCOUNTER — Telehealth: Payer: Self-pay | Admitting: Internal Medicine

## 2013-10-14 ENCOUNTER — Telehealth: Payer: Self-pay | Admitting: *Deleted

## 2013-10-14 DIAGNOSIS — I4891 Unspecified atrial fibrillation: Secondary | ICD-10-CM

## 2013-10-14 DIAGNOSIS — Z5181 Encounter for therapeutic drug level monitoring: Secondary | ICD-10-CM

## 2013-10-14 DIAGNOSIS — Z7901 Long term (current) use of anticoagulants: Secondary | ICD-10-CM

## 2013-10-14 LAB — POCT INR: INR: 1.4

## 2013-10-14 NOTE — Telephone Encounter (Signed)
per pof to sch blood trans-per MW sch 4/15-cld & spoke w/pt to adv of time & date-pt understood

## 2013-10-14 NOTE — Telephone Encounter (Signed)
Per staff message and POF I have scheduled appts.  JMW  

## 2013-10-15 ENCOUNTER — Ambulatory Visit (HOSPITAL_COMMUNITY)
Admission: RE | Admit: 2013-10-15 | Discharge: 2013-10-15 | Disposition: A | Discharge status: Home / Self Care | Payer: Medicare Other | Source: Ambulatory Visit | Attending: Internal Medicine | Admitting: Internal Medicine

## 2013-10-15 DIAGNOSIS — D649 Anemia, unspecified: Secondary | ICD-10-CM | POA: Insufficient documentation

## 2013-10-15 DIAGNOSIS — K5730 Diverticulosis of large intestine without perforation or abscess without bleeding: Secondary | ICD-10-CM

## 2013-10-15 DIAGNOSIS — Z7901 Long term (current) use of anticoagulants: Secondary | ICD-10-CM | POA: Insufficient documentation

## 2013-10-16 ENCOUNTER — Ambulatory Visit: Payer: Medicare Other

## 2013-10-16 ENCOUNTER — Ambulatory Visit (HOSPITAL_BASED_OUTPATIENT_CLINIC_OR_DEPARTMENT_OTHER): Payer: Medicare Other

## 2013-10-16 VITALS — BP 137/73 | HR 72 | Temp 97.7°F | Resp 18

## 2013-10-16 DIAGNOSIS — D649 Anemia, unspecified: Secondary | ICD-10-CM

## 2013-10-16 DIAGNOSIS — Z7901 Long term (current) use of anticoagulants: Secondary | ICD-10-CM

## 2013-10-16 DIAGNOSIS — I4891 Unspecified atrial fibrillation: Secondary | ICD-10-CM

## 2013-10-16 DIAGNOSIS — K5730 Diverticulosis of large intestine without perforation or abscess without bleeding: Secondary | ICD-10-CM

## 2013-10-16 LAB — PREPARE RBC (CROSSMATCH)

## 2013-10-16 LAB — PROTHROMBIN TIME
INR: 1.49 (ref <1.50)
PROTHROMBIN TIME: 17.6 s — AB (ref 11.6–15.2)

## 2013-10-16 MED ORDER — DIPHENHYDRAMINE HCL 25 MG PO CAPS
25.0000 mg | ORAL_CAPSULE | Freq: Once | ORAL | Status: AC
  Administered 2013-10-16: 25 mg via ORAL

## 2013-10-16 MED ORDER — ACETAMINOPHEN 325 MG PO TABS
650.0000 mg | ORAL_TABLET | Freq: Once | ORAL | Status: AC
  Administered 2013-10-16: 650 mg via ORAL

## 2013-10-16 MED ORDER — DIPHENHYDRAMINE HCL 25 MG PO CAPS
ORAL_CAPSULE | ORAL | Status: AC
  Filled 2013-10-16: qty 1

## 2013-10-16 MED ORDER — SODIUM CHLORIDE 0.9 % IJ SOLN
3.0000 mL | INTRAMUSCULAR | Status: DC | PRN
  Filled 2013-10-16: qty 10

## 2013-10-16 MED ORDER — SODIUM CHLORIDE 0.9 % IV SOLN
250.0000 mL | Freq: Once | INTRAVENOUS | Status: AC
  Administered 2013-10-16: 250 mL via INTRAVENOUS

## 2013-10-16 MED ORDER — ACETAMINOPHEN 325 MG PO TABS
ORAL_TABLET | ORAL | Status: AC
  Filled 2013-10-16: qty 2

## 2013-10-16 NOTE — Patient Instructions (Signed)
Blood Transfusion Information WHAT IS A BLOOD TRANSFUSION? A transfusion is the replacement of blood or some of its parts. Blood is made up of multiple cells which provide different functions.  Red blood cells carry oxygen and are used for blood loss replacement.  Boulier blood cells fight against infection.  Platelets control bleeding.  Plasma helps clot blood.  Other blood products are available for specialized needs, such as hemophilia or other clotting disorders. BEFORE THE TRANSFUSION  Who gives blood for transfusions?   You may be able to donate blood to be used at a later date on yourself (autologous donation).  Relatives can be asked to donate blood. This is generally not any safer than if you have received blood from a stranger. The same precautions are taken to ensure safety when a relative's blood is donated.  Healthy volunteers who are fully evaluated to make sure their blood is safe. This is blood bank blood. Transfusion therapy is the safest it has ever been in the practice of medicine. Before blood is taken from a donor, a complete history is taken to make sure that person has no history of diseases nor engages in risky social behavior (examples are intravenous drug use or sexual activity with multiple partners). The donor's travel history is screened to minimize risk of transmitting infections, such as malaria. The donated blood is tested for signs of infectious diseases, such as HIV and hepatitis. The blood is then tested to be sure it is compatible with you in order to minimize the chance of a transfusion reaction. If you or a relative donates blood, this is often done in anticipation of surgery and is not appropriate for emergency situations. It takes many days to process the donated blood. RISKS AND COMPLICATIONS Although transfusion therapy is very safe and saves many lives, the main dangers of transfusion include:   Getting an infectious disease.  Developing a  transfusion reaction. This is an allergic reaction to something in the blood you were given. Every precaution is taken to prevent this. The decision to have a blood transfusion has been considered carefully by your caregiver before blood is given. Blood is not given unless the benefits outweigh the risks. AFTER THE TRANSFUSION  Right after receiving a blood transfusion, you will usually feel much better and more energetic. This is especially true if your red blood cells have gotten low (anemic). The transfusion raises the level of the red blood cells which carry oxygen, and this usually causes an energy increase.  The nurse administering the transfusion will monitor you carefully for complications. HOME CARE INSTRUCTIONS  No special instructions are needed after a transfusion. You may find your energy is better. Speak with your caregiver about any limitations on activity for underlying diseases you may have. SEEK MEDICAL CARE IF:   Your condition is not improving after your transfusion.  You develop redness or irritation at the intravenous (IV) site. SEEK IMMEDIATE MEDICAL CARE IF:  Any of the following symptoms occur over the next 12 hours:  Shaking chills.  You have a temperature by mouth above 102 F (38.9 C), not controlled by medicine.  Chest, back, or muscle pain.  People around you feel you are not acting correctly or are confused.  Shortness of breath or difficulty breathing.  Dizziness and fainting.  You get a rash or develop hives.  You have a decrease in urine output.  Your urine turns a dark color or changes to pink, red, or brown. Any of the following   symptoms occur over the next 10 days:  You have a temperature by mouth above 102 F (38.9 C), not controlled by medicine.  Shortness of breath.  Weakness after normal activity.  The Biehn part of the eye turns yellow (jaundice).  You have a decrease in the amount of urine or are urinating less often.  Your  urine turns a dark color or changes to pink, red, or brown. Document Released: 06/17/2000 Document Revised: 09/12/2011 Document Reviewed: 02/04/2008 [employer] Patient Information 2014 [employer].  

## 2013-10-16 NOTE — Progress Notes (Signed)
Patient has a blood vessel that has burst in her left eye.  She is completely asymptomatic from this.  Discussed with Dr. Juliann Mule.  Will draw PT and check this.  Dr. Juliann Mule advised patient to see her opthalmologist.  Patient does have one and she will go see them.

## 2013-10-17 LAB — TYPE AND SCREEN
ABO/RH(D): A POS
Antibody Screen: NEGATIVE
DONOR AG TYPE: NEGATIVE
UNIT DIVISION: 0

## 2013-10-23 ENCOUNTER — Encounter: Payer: Self-pay | Admitting: Cardiology

## 2013-10-23 ENCOUNTER — Ambulatory Visit (INDEPENDENT_AMBULATORY_CARE_PROVIDER_SITE_OTHER): Payer: Medicare Other | Admitting: *Deleted

## 2013-10-23 ENCOUNTER — Ambulatory Visit (INDEPENDENT_AMBULATORY_CARE_PROVIDER_SITE_OTHER): Payer: Medicare Other | Admitting: Cardiology

## 2013-10-23 ENCOUNTER — Telehealth: Payer: Self-pay | Admitting: Cardiology

## 2013-10-23 VITALS — BP 152/74 | HR 80 | Ht 63.0 in | Wt 232.0 lb

## 2013-10-23 DIAGNOSIS — N183 Chronic kidney disease, stage 3 unspecified: Secondary | ICD-10-CM

## 2013-10-23 DIAGNOSIS — I509 Heart failure, unspecified: Secondary | ICD-10-CM

## 2013-10-23 DIAGNOSIS — I4891 Unspecified atrial fibrillation: Secondary | ICD-10-CM

## 2013-10-23 DIAGNOSIS — I2510 Atherosclerotic heart disease of native coronary artery without angina pectoris: Secondary | ICD-10-CM

## 2013-10-23 DIAGNOSIS — Z5181 Encounter for therapeutic drug level monitoring: Secondary | ICD-10-CM

## 2013-10-23 DIAGNOSIS — I4892 Unspecified atrial flutter: Secondary | ICD-10-CM

## 2013-10-23 DIAGNOSIS — Z7901 Long term (current) use of anticoagulants: Secondary | ICD-10-CM

## 2013-10-23 DIAGNOSIS — I5043 Acute on chronic combined systolic (congestive) and diastolic (congestive) heart failure: Secondary | ICD-10-CM

## 2013-10-23 DIAGNOSIS — D472 Monoclonal gammopathy: Secondary | ICD-10-CM

## 2013-10-23 LAB — POCT INR: INR: 1.6

## 2013-10-23 MED ORDER — FUROSEMIDE 40 MG PO TABS: 60.0000 mg | ORAL_TABLET | Freq: Two times a day (BID) | ORAL | Status: DC

## 2013-10-23 NOTE — Patient Instructions (Signed)
Adjust coumadin dose as recommended.  Increase furosemide to 60 mg daily.  Continue your other therapy  I will see you in 2 months.

## 2013-10-23 NOTE — Telephone Encounter (Signed)
Spoke to patient's daughter Milagros Loll she helps mother with her medications,at today's visit with Dr.Jordan he increase lasix to 60 mg twice a day.F/U appointment scheduled with Dr.Jordan in 2 months 12/27/13 at 3:30 PM.Advised to call sooner if needed.

## 2013-10-23 NOTE — Telephone Encounter (Signed)
New Message:  Pt's daughter called to state her mom has become emotional and cries a lot.. She also states with exertion her mom becomes SOB.. Even when just walking room to room.. Also, states her mom is having rectal bleeding.. She states another doctor is taking care of that and it is due to hemorrhoids...  Pt's daughter states she will not be at her moms appt today and just wanted to make Dr. Martinique aware... Also states her mom had a transfusion last week.Marland KitchenMarland Kitchen

## 2013-10-23 NOTE — Telephone Encounter (Signed)
Returned call to patient's daughter Milagros Loll she stated she will not be able to come with mother to appointment with Dr.Jordan this afternoon.Stated she wants Dr.Jordan to be aware of mother condition.Stated she would like to know Dr.Jordan's recommendations.

## 2013-10-23 NOTE — Progress Notes (Signed)
[name] Date of Birth: [birth year] Medical Record #[id number]  History of Present Illness: [name] is seen back today for a follow up visit.  She has had atrial fib with RVR and CHF. EF was down to 30%. Afib could not be regulated. Not a candidate for antiarrhythmics due to her multiple comorbidities/intolerances. A BiV pacemaker was implanted followed by AV nodal ablation in December 2013.  Echo was repeated in February 2014 and had improved with an EF up to 45 to 50%. Most recent Echo in 12/14 showed an EF of 40-45%. She complains today of worsening dyspnea on exertion. She feels her legs and abdomen are tight. She has gained 4 lbs since Feb. And 11 lbs since last July. She has chronic anemia and has been on Aranesp and iron infusions. Required recent blood transfusion. She was place on amiodarone by Dr. Lovena Le to see if she could maintain sinus rhythm.    Current Outpatient Prescriptions on File Prior to Visit  Medication Sig Dispense Refill  . amiodarone (PACERONE) 200 MG tablet Take 0.5 tablets (100 mg total) by mouth daily.  45 tablet  3  . amitriptyline (ELAVIL) 25 MG tablet Take 75 mg by mouth at bedtime.        Marland Kitchen atorvastatin (LIPITOR) 40 MG tablet Take 40 mg by mouth every evening.       . diltiazem (CARDIZEM CD) 240 MG 24 hr capsule Take 1 capsule (240 mg total) by mouth daily.  30 capsule  11  . donepezil (ARICEPT) 5 MG tablet Take 5 mg by mouth at bedtime.      Marland Kitchen esomeprazole (NEXIUM) 40 MG capsule Take 40 mg by mouth daily before breakfast.        . ezetimibe (ZETIA) 10 MG tablet Take 10 mg by mouth every evening.       . insulin glargine (LANTUS) 100 UNIT/ML injection Inject 35 Units into the skin 2 (two) times daily.      . insulin lispro (HUMALOG) 100 UNIT/ML injection Inject 0-20 Units into the skin 2 (two) times daily as needed for high blood sugar. Home sliding scale If blood sugar is <200 take 15 units If blood sugar is >200 take 20 units If blood sugar is less  than 70, don't take any      . levothyroxine (SYNTHROID, LEVOTHROID) 100 MCG tablet Take 100 mcg by mouth every morning.       . metoprolol succinate (TOPROL-XL) 50 MG 24 hr tablet Take 1 tablet (50 mg total) by mouth every morning.  30 tablet  6  . Multiple Vitamins-Minerals (EYE VITAMINS PO) Take 1 tablet by mouth 2 (two) times daily.       . nitroGLYCERIN (NITROSTAT) 0.4 MG SL tablet Place 0.4 mg under the tongue every 5 (five) minutes as needed. For chest pain      . potassium chloride SA (K-DUR,KLOR-CON) 20 MEQ tablet Take 1 tablet (20 mEq total) by mouth every morning.  30 tablet  3  . warfarin (COUMADIN) 5 MG tablet Take as directed by coumadin clinic  70 tablet  3  . [DISCONTINUED] enoxaparin (LOVENOX) 100 MG/ML SOLN Inject into the skin every 12 (twelve) hours.        . [DISCONTINUED] simvastatin (ZOCOR) 80 MG tablet Take 80 mg by mouth at bedtime.         No current facility-administered medications on file prior to visit.    Allergies  Allergen Reactions  . Betadine [Povidone Iodine] Itching  .  Capoten [Captopril] Other (See Comments)    unknown  . Codone [Hydrocodone] Nausea And Vomiting  . Methadone Nausea And Vomiting  . Penicillins Swelling  . Propoxyphene And Methadone     Intolerance to darvocet    Past Medical History  Diagnosis Date  . Coronary artery disease     a. Severe two vessel; s/p CABG (LIMA-LAD, SVG-diag, SVG-PDA);  b. 05/2012 NSTEMI in setting of rapid aflutter;  c. 06/2012 Lexiscan cardiolite: small, mild reversible defect in lateral wall->mild ischemia, low-risk->med Rx.  Marland Kitchen Hypertension   . Type II diabetes mellitus   . Hyperlipidemia   . Diabetic neuropathy   . CVA (cerebral vascular accident) 2004  . Hypothyroidism   . Chronic anemia   . GERD (gastroesophageal reflux disease)   . Arthritis   . Memory loss, short term   . COPD (chronic obstructive pulmonary disease)   . Blood transfusion without reported diagnosis   . Chronic combined systolic  and diastolic CHF (congestive heart failure)     a. 07/2012 Echo: EF 30%, Gr II DD, Mild AS/MR, Mod-Sev TR, mild bi-atrial and RV dil, PASP 20mmHg.  . Moderate mitral regurgitation     a. mod by TEE 2012, mild by echo 2013, 07/2012  . Severe tricuspid regurgitation     a. Severe by TEE 2012, mod by echo 2013, mod-sev by echo 07/2012  . Chronic vulvitis 07/1993  . Idiopathic anemia 2006  . Atrial fibrillation     a. Failed amiodarone (prolonged QT), not a candidate for class IC meds due to her CAD. No Multaq due to CHF hx;  b. 07/2012 s/p SJM Anthem RF Bi-V PPM, ser # [id number] and AV node RFCA;  c. chronic coumadin.  . Atrial flutter   . Pacemaker   . GI bleed     a. 10/2011 - Dr Benson Norway did scope which showed hiatal hernia, colon revealed diverticula (diverticular bleed), internal/ext hemorrhoids.    Past Surgical History  Procedure Laterality Date  . Coronary artery bypass graft  06/2009    X3, LIMA to LAD, SVG to diagonal, SVG to the posterior descending artery.  . Tonsillectomy    . Knee arthroscopy  08/2001  . Transthoracic echocardiogram  11/12/2010     Ejection fraction felt to be around 50%.  Wall thickness was increased in a pattern of mild LVH.  Moderately dilated left atrium.  Mildly dilated right atrium. Right ventricle was mildly dilated with mildly reduced systolic function  . Cardiac catheterization  06/09/2009    inferior wall hypokinesia with ejection fraction of 55%.  . Abdominal hysterectomy    . Joint replacement    . Tee without cardioversion  05/23/2011    Procedure: TRANSESOPHAGEAL ECHOCARDIOGRAM (TEE);  Surgeon: Lelon Perla, MD;  Location: [facility];  Service: Cardiovascular;  Laterality: N/A;  . Incision and drainage abscess  05/07/2012    Procedure: INCISION AND DRAINAGE ABSCESS;  Surgeon: Adin Hector, MD;  Location: [facility];  Service: General;  Laterality: N/A;  Groin wound  . Total abdominal hysterectomy  1979  . Breast biopsy  Left  . Bilateral salpingectomy   10/2000  . Breast biopsy  09/18/2009    fibrocystic change  . Coronary artery bypass graft  06/2009  . Pacemaker insertion  08/2012  . Colonoscopy N/A 01/19/2013    Procedure: COLONOSCOPY;  Surgeon: Inda Castle, MD;  Location: [facility];  Service: Endoscopy;  Laterality: N/A;  . Esophagogastroduodenoscopy N/A 01/19/2013    Procedure: ESOPHAGOGASTRODUODENOSCOPY (EGD);  Surgeon: Inda Castle, MD;  Location: [facility];  Service: Endoscopy;  Laterality: N/A;    History  Smoking status  . Former Smoker  . Types: Cigarettes  Smokeless tobacco  . Never Used    History  Alcohol Use No    Family History  Problem Relation Age of Onset  . Colon cancer Mother   . Stroke Mother   . Hypertension Mother   . Cancer Mother     breast  . Cancer Father     colon  . Heart attack Father     x2  . Renal Disease Sister     dialysis-twin sister  . Renal Disease Sister     renal bypass    Review of Systems: The review of systems is per the HPI.   All other systems were reviewed and are negative.  Physical Exam: BP 152/74  Pulse 80  Ht 5\' 3"  (1.6 m)  Wt 232 lb (105.235 kg)  BMI 41.11 kg/m2 Patient is  pleasant and in no acute distress. She has memory loss. Skin is warm and dry. Color is normal.  HEENT is unremarkable. Normocephalic/atraumatic. PERRL. Sclera are nonicteric. Neck is supple. No masses. No JVD. Lungs are clear. Cardiac exam shows a regular rate and rhythm. There is a grade 2/6 systolic murmur heard at the left sternal border.. Abdomen is soft. Extremities are quite full and with 2+  brawny edema. Gait and ROM are intact. No gross neurologic deficits noted.  LABORATORY DATA:  Lab Results  Component Value Date   WBC 8.5 10/11/2013   HGB 8.7* 10/11/2013   HCT 27.1* 10/11/2013   PLT 171 10/11/2013   GLUCOSE 143* 09/27/2013   CHOL 160 05/26/2012   TRIG 80 05/26/2012   HDL 29* 05/26/2012   LDLCALC 115* 05/26/2012   ALT 23 09/27/2013   AST 29 09/27/2013   NA 139  09/27/2013   K 4.4 09/27/2013   CL 100 01/20/2013   CREATININE 1.5* 09/27/2013   BUN 28.4* 09/27/2013   CO2 21* 09/27/2013   TSH 1.257 01/18/2013   INR 1.6 10/23/2013   HGBA1C 8.0* 01/16/2013    Assessment / Plan:  1. Acute on Chronic systolic and diastolic heart failure -volume is increased with increased edema and weight gain. Symptoms exacerbated by anemia. I stressed the importance of sodium restriction but I fear she has little insight. I recommend increasing Lasix to 60 mg bid.   2. S/p BiV pacemaker implant with AV node ablation. 99% biV paced.  3. Atrial fib -  now s/p AV node ablation. Now on amiodarone to see if this will reduce her time in AFib. Will follow up with Dr. Lovena Le. If no significant improvement I would stop amiodarone given risk of side effects. If is is working we will continue.  4. Chronic coumadin therapy. INR was 1.6 today. Dose adjusted.  5. Memory loss.  6. CAD status post CABG. No anginal symptoms.  7. Anemia- chronic. Followed by hematology.

## 2013-10-28 ENCOUNTER — Ambulatory Visit (HOSPITAL_BASED_OUTPATIENT_CLINIC_OR_DEPARTMENT_OTHER): Payer: Medicare Other | Admitting: Internal Medicine

## 2013-10-28 ENCOUNTER — Other Ambulatory Visit (HOSPITAL_BASED_OUTPATIENT_CLINIC_OR_DEPARTMENT_OTHER): Payer: Medicare Other

## 2013-10-28 ENCOUNTER — Encounter: Payer: Self-pay | Admitting: Internal Medicine

## 2013-10-28 ENCOUNTER — Telehealth: Payer: Self-pay | Admitting: Internal Medicine

## 2013-10-28 ENCOUNTER — Ambulatory Visit (HOSPITAL_BASED_OUTPATIENT_CLINIC_OR_DEPARTMENT_OTHER): Payer: Medicare Other

## 2013-10-28 VITALS — BP 168/69 | HR 70 | Temp 97.9°F | Resp 20 | Ht 63.0 in | Wt 229.8 lb

## 2013-10-28 DIAGNOSIS — N189 Chronic kidney disease, unspecified: Secondary | ICD-10-CM

## 2013-10-28 DIAGNOSIS — D472 Monoclonal gammopathy: Secondary | ICD-10-CM

## 2013-10-28 DIAGNOSIS — N179 Acute kidney failure, unspecified: Secondary | ICD-10-CM

## 2013-10-28 DIAGNOSIS — Z7901 Long term (current) use of anticoagulants: Secondary | ICD-10-CM

## 2013-10-28 DIAGNOSIS — N039 Chronic nephritic syndrome with unspecified morphologic changes: Secondary | ICD-10-CM

## 2013-10-28 DIAGNOSIS — D631 Anemia in chronic kidney disease: Secondary | ICD-10-CM

## 2013-10-28 DIAGNOSIS — D649 Anemia, unspecified: Secondary | ICD-10-CM

## 2013-10-28 DIAGNOSIS — I4891 Unspecified atrial fibrillation: Secondary | ICD-10-CM

## 2013-10-28 LAB — COMPREHENSIVE METABOLIC PANEL (CC13)
ALK PHOS: 117 U/L (ref 40–150)
ALT: 19 U/L (ref 0–55)
AST: 31 U/L (ref 5–34)
Albumin: 3.3 g/dL — ABNORMAL LOW (ref 3.5–5.0)
Anion Gap: 10 mEq/L (ref 3–11)
BILIRUBIN TOTAL: 0.4 mg/dL (ref 0.20–1.20)
BUN: 30.7 mg/dL — AB (ref 7.0–26.0)
CO2: 28 mEq/L (ref 22–29)
CREATININE: 1.8 mg/dL — AB (ref 0.6–1.1)
Calcium: 9.2 mg/dL (ref 8.4–10.4)
Chloride: 102 mEq/L (ref 98–109)
Glucose: 215 mg/dl — ABNORMAL HIGH (ref 70–140)
Potassium: 4.2 mEq/L (ref 3.5–5.1)
Sodium: 140 mEq/L (ref 136–145)
Total Protein: 8.1 g/dL (ref 6.4–8.3)

## 2013-10-28 LAB — CBC WITH DIFFERENTIAL/PLATELET
BASO%: 0.5 % (ref 0.0–2.0)
BASOS ABS: 0 10*3/uL (ref 0.0–0.1)
EOS%: 2.1 % (ref 0.0–7.0)
Eosinophils Absolute: 0.1 10*3/uL (ref 0.0–0.5)
HEMATOCRIT: 30.2 % — AB (ref 34.8–46.6)
HEMOGLOBIN: 9.5 g/dL — AB (ref 11.6–15.9)
LYMPH%: 17.5 % (ref 14.0–49.7)
MCH: 28.5 pg (ref 25.1–34.0)
MCHC: 31.5 g/dL (ref 31.5–36.0)
MCV: 90.7 fL (ref 79.5–101.0)
MONO#: 1 10*3/uL — ABNORMAL HIGH (ref 0.1–0.9)
MONO%: 15.2 % — ABNORMAL HIGH (ref 0.0–14.0)
NEUT#: 4.3 10*3/uL (ref 1.5–6.5)
NEUT%: 64.7 % (ref 38.4–76.8)
Platelets: 183 10*3/uL (ref 145–400)
RBC: 3.33 10*6/uL — ABNORMAL LOW (ref 3.70–5.45)
RDW: 15.7 % — ABNORMAL HIGH (ref 11.2–14.5)
WBC: 6.6 10*3/uL (ref 3.9–10.3)
lymph#: 1.2 10*3/uL (ref 0.9–3.3)

## 2013-10-28 MED ORDER — DARBEPOETIN ALFA-POLYSORBATE 200 MCG/0.4ML IJ SOLN
200.0000 ug | Freq: Once | INTRAMUSCULAR | Status: AC
  Administered 2013-10-28: 200 ug via SUBCUTANEOUS
  Filled 2013-10-28: qty 0.4

## 2013-10-28 NOTE — Telephone Encounter (Signed)
GAve pt appt for lab and MD for May 2015

## 2013-10-28 NOTE — Patient Instructions (Signed)
Anemia, Nonspecific Anemia is a condition in which the concentration of red blood cells or hemoglobin in the blood is below normal. Hemoglobin is a substance in red blood cells that carries oxygen to the tissues of the body. Anemia results in not enough oxygen reaching these tissues.  CAUSES  Common causes of anemia include:   Excessive bleeding. Bleeding may be internal or external. This includes excessive bleeding from periods (in women) or from the intestine.   Poor nutrition.   Chronic kidney, thyroid, and liver disease.  Bone marrow disorders that decrease red blood cell production.  Cancer and treatments for cancer.  HIV, AIDS, and their treatments.  Spleen problems that increase red blood cell destruction.  Blood disorders.  Excess destruction of red blood cells due to infection, medicines, and autoimmune disorders. SIGNS AND SYMPTOMS   Minor weakness.   Dizziness.   Headache.  Palpitations.   Shortness of breath, especially with exercise.   Paleness.  Cold sensitivity.  Indigestion.  Nausea.  Difficulty sleeping.  Difficulty concentrating. Symptoms may occur suddenly or they may develop slowly.  DIAGNOSIS  Additional blood tests are often needed. These help your health care provider determine the best treatment. Your health care provider will check your stool for blood and look for other causes of blood loss.  TREATMENT  Treatment varies depending on the cause of the anemia. Treatment can include:   Supplements of iron, vitamin U23, or folic acid.   Hormone medicines.   A blood transfusion. This may be needed if blood loss is severe.   Hospitalization. This may be needed if there is significant continual blood loss.   Dietary changes.  Spleen removal. HOME CARE INSTRUCTIONS Keep all follow-up appointments. It often takes many weeks to correct anemia, and having your health care provider check on your condition and your response to  treatment is very important. SEEK IMMEDIATE MEDICAL CARE IF:   You develop extreme weakness, shortness of breath, or chest pain.   You become dizzy or have trouble concentrating.  You develop heavy vaginal bleeding.   You develop a rash.   You have bloody or black, tarry stools.   You faint.   You vomit up blood.   You vomit repeatedly.   You have abdominal pain.  You have a fever or persistent symptoms for more than 2 3 days.   You have a fever and your symptoms suddenly get worse.   You are dehydrated.  MAKE SURE YOU:  Understand these instructions.  Will watch your condition.  Will get help right away if you are not doing well or get worse. Document Released: 07/28/2004 Document Revised: 02/20/2013 Document Reviewed: 12/14/2012 [facility] Patient Information 2014 [place]. Darbepoetin Alfa injection What is this medicine? DARBEPOETIN ALFA (dar be POE e tin AL fa) helps your body make more red blood cells. It is used to treat anemia caused by chronic kidney failure and chemotherapy. This medicine may be used for other purposes; ask your health care provider or pharmacist if you have questions. COMMON BRAND NAME(S): Aranesp What should I tell my health care provider before I take this medicine? They need to know if you have any of these conditions: -blood clotting disorders or history of blood clots -cancer patient not on chemotherapy -cystic fibrosis -heart disease, such as angina, heart failure, or a history of a heart attack -hemoglobin level of 12 g/dL or greater -high blood pressure -low levels of folate, iron, or vitamin B12 -seizures -an unusual or allergic reaction  to darbepoetin, erythropoietin, albumin, hamster proteins, latex, other medicines, foods, dyes, or preservatives -pregnant or trying to get pregnant -breast-feeding How should I use this medicine? This medicine is for injection into a vein or under the skin. It is usually given  by a health care professional in a hospital or clinic setting. If you get this medicine at home, you will be taught how to prepare and give this medicine. Do not shake the solution before you withdraw a dose. Use exactly as directed. Take your medicine at regular intervals. Do not take your medicine more often than directed. It is important that you put your used needles and syringes in a special sharps container. Do not put them in a trash can. If you do not have a sharps container, call your pharmacist or healthcare provider to get one. Talk to your pediatrician regarding the use of this medicine in children. While this medicine may be used in children as young as 1 year for selected conditions, precautions do apply. Overdosage: If you think you have taken too much of this medicine contact a poison control center or emergency room at once. NOTE: This medicine is only for you. Do not share this medicine with others. What if I miss a dose? If you miss a dose, take it as soon as you can. If it is almost time for your next dose, take only that dose. Do not take double or extra doses. What may interact with this medicine? Do not take this medicine with any of the following medications: -epoetin alfa This list may not describe all possible interactions. Give your health care provider a list of all the medicines, herbs, non-prescription drugs, or dietary supplements you use. Also tell them if you smoke, drink alcohol, or use illegal drugs. Some items may interact with your medicine. What should I watch for while using this medicine? Visit your prescriber or health care professional for regular checks on your progress and for the needed blood tests and blood pressure measurements. It is especially important for the doctor to make sure your hemoglobin level is in the desired range, to limit the risk of potential side effects and to give you the best benefit. Keep all appointments for any recommended tests.  Check your blood pressure as directed. Ask your doctor what your blood pressure should be and when you should contact him or her. As your body makes more red blood cells, you may need to take iron, folic acid, or vitamin B supplements. Ask your doctor or health care provider which products are right for you. If you have kidney disease continue dietary restrictions, even though this medication can make you feel better. Talk with your doctor or health care professional about the foods you eat and the vitamins that you take. What side effects may I notice from receiving this medicine? Side effects that you should report to your doctor or health care professional as soon as possible: -allergic reactions like skin rash, itching or hives, swelling of the face, lips, or tongue -breathing problems -changes in vision -chest pain -confusion, trouble speaking or understanding -feeling faint or lightheaded, falls -high blood pressure -muscle aches or pains -pain, swelling, warmth in the leg -rapid weight gain -severe headaches -sudden numbness or weakness of the face, arm or leg -trouble walking, dizziness, loss of balance or coordination -seizures (convulsions) -swelling of the ankles, feet, hands -unusually weak or tired Side effects that usually do not require medical attention (report to your doctor or health care  professional if they continue or are bothersome): -diarrhea -fever, chills (flu-like symptoms) -headaches -nausea, vomiting -redness, stinging, or swelling at site where injected This list may not describe all possible side effects. Call your doctor for medical advice about side effects. You may report side effects to FDA at 1-800-FDA-1088. Where should I keep my medicine? Keep out of the reach of children. Store in a refrigerator between 2 and 8 degrees C (36 and 46 degrees F). Do not freeze. Do not shake. Throw away any unused portion if using a single-dose vial. Throw away any  unused medicine after the expiration date. NOTE: This sheet is a summary. It may not cover all possible information. If you have questions about this medicine, talk to your doctor, pharmacist, or health care provider.  2014, [employer]. (2008-06-03 10:23:57)

## 2013-10-28 NOTE — Patient Instructions (Signed)
Darbepoetin Alfa injection What is this medicine? DARBEPOETIN ALFA (dar be POE e tin AL fa) helps your body make more red blood cells. It is used to treat anemia caused by chronic kidney failure and chemotherapy. This medicine may be used for other purposes; ask your health care provider or pharmacist if you have questions. COMMON BRAND NAME(S): Aranesp What should I tell my health care provider before I take this medicine? They need to know if you have any of these conditions: -blood clotting disorders or history of blood clots -cancer patient not on chemotherapy -cystic fibrosis -heart disease, such as angina, heart failure, or a history of a heart attack -hemoglobin level of 12 g/dL or greater -high blood pressure -low levels of folate, iron, or vitamin B12 -seizures -an unusual or allergic reaction to darbepoetin, erythropoietin, albumin, hamster proteins, latex, other medicines, foods, dyes, or preservatives -pregnant or trying to get pregnant -breast-feeding How should I use this medicine? This medicine is for injection into a vein or under the skin. It is usually given by a health care professional in a hospital or clinic setting. If you get this medicine at home, you will be taught how to prepare and give this medicine. Do not shake the solution before you withdraw a dose. Use exactly as directed. Take your medicine at regular intervals. Do not take your medicine more often than directed. It is important that you put your used needles and syringes in a special sharps container. Do not put them in a trash can. If you do not have a sharps container, call your pharmacist or healthcare provider to get one. Talk to your pediatrician regarding the use of this medicine in children. While this medicine may be used in children as young as 1 year for selected conditions, precautions do apply. Overdosage: If you think you have taken too much of this medicine contact a poison control center or  emergency room at once. NOTE: This medicine is only for you. Do not share this medicine with others. What if I miss a dose? If you miss a dose, take it as soon as you can. If it is almost time for your next dose, take only that dose. Do not take double or extra doses. What may interact with this medicine? Do not take this medicine with any of the following medications: -epoetin alfa This list may not describe all possible interactions. Give your health care provider a list of all the medicines, herbs, non-prescription drugs, or dietary supplements you use. Also tell them if you smoke, drink alcohol, or use illegal drugs. Some items may interact with your medicine. What should I watch for while using this medicine? Visit your prescriber or health care professional for regular checks on your progress and for the needed blood tests and blood pressure measurements. It is especially important for the doctor to make sure your hemoglobin level is in the desired range, to limit the risk of potential side effects and to give you the best benefit. Keep all appointments for any recommended tests. Check your blood pressure as directed. Ask your doctor what your blood pressure should be and when you should contact him or her. As your body makes more red blood cells, you may need to take iron, folic acid, or vitamin B supplements. Ask your doctor or health care provider which products are right for you. If you have kidney disease continue dietary restrictions, even though this medication can make you feel better. Talk with your doctor or health   care professional about the foods you eat and the vitamins that you take. What side effects may I notice from receiving this medicine? Side effects that you should report to your doctor or health care professional as soon as possible: -allergic reactions like skin rash, itching or hives, swelling of the face, lips, or tongue -breathing problems -changes in vision -chest  pain -confusion, trouble speaking or understanding -feeling faint or lightheaded, falls -high blood pressure -muscle aches or pains -pain, swelling, warmth in the leg -rapid weight gain -severe headaches -sudden numbness or weakness of the face, arm or leg -trouble walking, dizziness, loss of balance or coordination -seizures (convulsions) -swelling of the ankles, feet, hands -unusually weak or tired Side effects that usually do not require medical attention (report to your doctor or health care professional if they continue or are bothersome): -diarrhea -fever, chills (flu-like symptoms) -headaches -nausea, vomiting -redness, stinging, or swelling at site where injected This list may not describe all possible side effects. Call your doctor for medical advice about side effects. You may report side effects to FDA at 1-800-FDA-1088. Where should I keep my medicine? Keep out of the reach of children. Store in a refrigerator between 2 and 8 degrees C (36 and 46 degrees F). Do not freeze. Do not shake. Throw away any unused portion if using a single-dose vial. Throw away any unused medicine after the expiration date. NOTE: This sheet is a summary. It may not cover all possible information. If you have questions about this medicine, talk to your doctor, pharmacist, or health care provider.  2014, [employer]. (2008-06-03 10:23:57)  

## 2013-10-29 NOTE — Progress Notes (Signed)
[facility] OFFICE PROGRESS NOTE  [name], MD [place], [place]. [place] [address]  DIAGNOSIS: Chronic anemia - Plan: CBC with Differential, Comprehensive metabolic panel (Cmet) - [facility], Lactate dehydrogenase (LDH) - [facility]  Acute on chronic renal failure  Anemia associated with chronic renal failure  Atrial fibrillation with RVR  Chief Complaint  Patient presents with  . Anemia associated with chronic renal failure    CURRENT THERAPY: Aranesp 200 mcg subcutaneous prn for a hemoglobin less than 11.   INTERVAL HISTORY: [name] 80 y.o. female with a history of IgG lambda MGUS, Anemia associated with chronic renal failure who presents for follow up. She was last seen by me on 10/02/2013. Today, she is accompanied by her daughter.  She denies any recent hospitalizations or emergency room visits. She denies rectal bleeding. She is compliant to anticoagulation.  She continues aranesp 200 mcg subcutaneous every month for a hemogloblin less than 11. She denies fevers of chills or acute shortness of breath. She also denies chest pain.  MEDICAL HISTORY: Past Medical History  Diagnosis Date  . Coronary artery disease     a. Severe two vessel; s/p CABG (LIMA-LAD, SVG-diag, SVG-PDA);  b. 05/2012 NSTEMI in setting of rapid aflutter;  c. 06/2012 Lexiscan cardiolite: small, mild reversible defect in lateral wall->mild ischemia, low-risk->med Rx.  Marland Kitchen Hypertension   . Type II diabetes mellitus   . Hyperlipidemia   . Diabetic neuropathy   . CVA (cerebral vascular accident) 2004  . Hypothyroidism   . Chronic anemia   . GERD (gastroesophageal reflux disease)   . Arthritis   . Memory loss, short term   . COPD (chronic obstructive pulmonary disease)   . Blood transfusion without reported diagnosis   . Chronic combined systolic and diastolic CHF (congestive heart failure)     a. 07/2012 Echo: EF 30%, Gr II DD, Mild AS/MR, Mod-Sev TR, mild  bi-atrial and RV dil, PASP 39mmHg.  . Moderate mitral regurgitation     a. mod by TEE 2012, mild by echo 2013, 07/2012  . Severe tricuspid regurgitation     a. Severe by TEE 2012, mod by echo 2013, mod-sev by echo 07/2012  . Chronic vulvitis 07/1993  . Idiopathic anemia 2006  . Atrial fibrillation     a. Failed amiodarone (prolonged QT), not a candidate for class IC meds due to her CAD. No Multaq due to CHF hx;  b. 07/2012 s/p SJM Anthem RF Bi-V PPM, ser # [id number] and AV node RFCA;  c. chronic coumadin.  . Atrial flutter   . Pacemaker   . GI bleed     a. 10/2011 - Dr Benson Norway did scope which showed hiatal hernia, colon revealed diverticula (diverticular bleed), internal/ext hemorrhoids.    INTERIM HISTORY: has Atrial fibrillation; Hypothyroidism; Chronic anticoagulation; CAD (coronary artery disease); Diastolic dysfunction; Anemia associated with chronic renal failure; Atrial tachycardia; Right heart failure; MGUS (monoclonal gammopathy of unknown significance); GI bleed; CKD (chronic kidney disease) stage 3, GFR 30-59 ml/min; Abscess of right thigh s/p I&D 30/02/6577; Chronic systolic CHF (congestive heart failure); Atrial flutter; Long term (current) use of anticoagulants; Acute on chronic combined systolic and diastolic congestive heart failure, NYHA class 4; Diabetes mellitus; Chronic anemia; Cardiomyopathy-cause unknown ? rate related v ischemic; Acute respiratory failure; Pulmonary edema; Pleural effusion; Atrial fibrillation with RVR; Physical deconditioning; Fever; Pneumonia; Weakness; Bacteremia due to Escherichia coli; Coronary artery disease; Hx of CABG; Chronic combined systolic and diastolic congestive heart failure; Diabetes  mellitus type 2 with complications; Acute on chronic renal failure; Pacemaker; DOE (dyspnea on exertion); Benign neoplasm of stomach; Adenomatous duodenal polyp; Diverticulosis of colon (without mention of hemorrhage); Symptomatic anemia; and Encounter for therapeutic drug  monitoring on her problem list.    ALLERGIES:  is allergic to betadine; capoten; codone; methadone; penicillins; and propoxyphene and methadone.  MEDICATIONS: has a current medication list which includes the following prescription(s): amiodarone, amitriptyline, atorvastatin, diltiazem, donepezil, esomeprazole, ezetimibe, furosemide, insulin glargine, insulin lispro, levothyroxine, metoprolol succinate, multiple vitamins-minerals, nitroglycerin, potassium chloride sa, and warfarin.  SURGICAL HISTORY:  Past Surgical History  Procedure Laterality Date  . Coronary artery bypass graft  06/2009    X3, LIMA to LAD, SVG to diagonal, SVG to the posterior descending artery.  . Tonsillectomy    . Knee arthroscopy  08/2001  . Transthoracic echocardiogram  11/12/2010     Ejection fraction felt to be around 50%.  Wall thickness was increased in a pattern of mild LVH.  Moderately dilated left atrium.  Mildly dilated right atrium. Right ventricle was mildly dilated with mildly reduced systolic function  . Cardiac catheterization  06/09/2009    inferior wall hypokinesia with ejection fraction of 55%.  . Abdominal hysterectomy    . Joint replacement    . Tee without cardioversion  05/23/2011    Procedure: TRANSESOPHAGEAL ECHOCARDIOGRAM (TEE);  Surgeon: Lelon Perla, MD;  Location: [facility];  Service: Cardiovascular;  Laterality: N/A;  . Incision and drainage abscess  05/07/2012    Procedure: INCISION AND DRAINAGE ABSCESS;  Surgeon: Adin Hector, MD;  Location: [facility];  Service: General;  Laterality: N/A;  Groin wound  . Total abdominal hysterectomy  1979  . Breast biopsy  Left  . Bilateral salpingectomy  10/2000  . Breast biopsy  09/18/2009    fibrocystic change  . Coronary artery bypass graft  06/2009  . Pacemaker insertion  08/2012  . Colonoscopy N/A 01/19/2013    Procedure: COLONOSCOPY;  Surgeon: Inda Castle, MD;  Location: [facility];  Service: Endoscopy;  Laterality: N/A;  .  Esophagogastroduodenoscopy N/A 01/19/2013    Procedure: ESOPHAGOGASTRODUODENOSCOPY (EGD);  Surgeon: Inda Castle, MD;  Location: [facility];  Service: Endoscopy;  Laterality: N/A;    REVIEW OF SYSTEMS:   Constitutional: Denies fevers, chills or abnormal weight loss Eyes: Denies blurriness of vision Ears, nose, mouth, throat, and face: Denies mucositis or sore throat Respiratory: Denies cough, dyspnea or wheezes Cardiovascular: Denies palpitation, chest discomfort or lower extremity swelling Gastrointestinal:  Denies nausea, heartburn or change in bowel habits Skin: Denies abnormal skin rashes Lymphatics: Denies new lymphadenopathy or easy bruising Neurological:Denies numbness, tingling or new weaknesses Behavioral/Psych: Mood is stable, no new changes  All other systems were reviewed with the patient and are negative.  PHYSICAL EXAMINATION: ECOG PERFORMANCE STATUS: 1 - Symptomatic but completely ambulatory  Blood pressure 168/69, pulse 70, temperature 97.9 F (36.6 C), temperature source Oral, resp. rate 20, height 5\' 3"  (1.6 m), weight 229 lb 12.8 oz (104.237 kg), SpO2 100.00%.  GENERAL:alert, no distress and comfortable; moderately obese SKIN: skin color, texture, turgor are normal, no rashes or significant lesions EYES: normal, Conjunctiva are pink and non-injected, sclera clear OROPHARYNX:no exudate, no erythema and lips, buccal mucosa, and tongue normal  NECK: supple, thyroid normal size, non-tender, without nodularity LYMPH:  no palpable lymphadenopathy in the cervical, axillary or supraclavicular LUNGS: clear to auscultation and percussion with normal breathing effort HEART:  RR with pacemaker in the left infraclavicular area. Chronic lower extremity  brawny edema with stasis changes.  ABDOMEN:abdomen soft, non-tender and normal bowel sounds Musculoskeletal:no cyanosis of digits and no clubbing  NEURO: alert & oriented x 3 with fluent speech, no focal motor/sensory  deficits  Labs:  Lab Results  Component Value Date   WBC 6.6 10/28/2013   HGB 9.5* 10/28/2013   HCT 30.2* 10/28/2013   MCV 90.7 10/28/2013   PLT 183 10/28/2013   NEUTROABS 4.3 10/28/2013      Chemistry      Component Value Date/Time   NA 140 10/28/2013 1539   NA 137 01/20/2013 0443   K 4.2 10/28/2013 1539   K 3.5 01/20/2013 0443   CL 100 01/20/2013 0443   CL 105 08/30/2012 1108   CO2 28 10/28/2013 1539   CO2 26 01/20/2013 0443   BUN 30.7* 10/28/2013 1539   BUN 24* 01/20/2013 0443   CREATININE 1.8* 10/28/2013 1539   CREATININE 1.63* 01/20/2013 0443   CREATININE 1.68* 05/16/2011 1352      Component Value Date/Time   CALCIUM 9.2 10/28/2013 1539   CALCIUM 8.9 01/20/2013 0443   ALKPHOS 117 10/28/2013 1539   ALKPHOS 145* 08/07/2012 0445   AST 31 10/28/2013 1539   AST 29 08/07/2012 0445   ALT 19 10/28/2013 1539   ALT 17 08/07/2012 0445   BILITOT 0.40 10/28/2013 1539   BILITOT 0.4 08/07/2012 0445     CBC:  Recent Labs Lab 10/28/13 1538  WBC 6.6  NEUTROABS 4.3  HGB 9.5*  HCT 30.2*  MCV 90.7  PLT 183   Studies:  No results found.   RADIOGRAPHIC STUDIES: No results found.  ASSESSMENT: [name] 80 y.o. female with a history of Chronic anemia - Plan: CBC with Differential, Comprehensive metabolic panel (Cmet) - [facility], Lactate dehydrogenase (LDH) - [facility]  Acute on chronic renal failure  Anemia associated with chronic renal failure  Atrial fibrillation with RVR   PLAN:  1. Anemia of chronic renal failure.  --She feels less symptomatic.  She received one unit of 4/15.  --Today, her hemoglobin is 9.5 up from  8.7. She received her aranesp 200 mcg subcutaneous shot on 04/03 and on today.  We will increase it to 300 mcg monthly. She will continue to have CBCs every 4 weeks and have aranesp as indicated.  Her history is also notable for angiodyplasia and diverticulosis and hemorrhoids resulting in heme positive stool.    2. Afib/CHF/Pacer --Followed by cardiology.  Her medtronic  biventricular pacemaker was working normally on 12/09.  She was started on amiodarone 100 mg daily and will continue at that dose.  3. CKDz. --Her creatinine is 1.8 today.    4. MGUS. --Her IgG levels remains stable. She denies any symptoms including bone pain.  5. Follow-up.   --patient will return to clinic in several weeks for CBC, CMP and iron studies.   She had a colonoscopy  With Dr. Sandy Salaam. Kaplan on 01/19/2013.   All questions were answered. The patient knows to call the clinic with any problems, questions or concerns. We can certainly see the patient much sooner if necessary.  I spent 15 minutes counseling the patient face to face. The total time spent in the appointment was 25 minutes.    Concha Norway, MD 10/29/2013 5:29 AM

## 2013-11-01 ENCOUNTER — Ambulatory Visit (INDEPENDENT_AMBULATORY_CARE_PROVIDER_SITE_OTHER): Payer: Medicare Other | Admitting: *Deleted

## 2013-11-01 DIAGNOSIS — I4891 Unspecified atrial fibrillation: Secondary | ICD-10-CM

## 2013-11-01 DIAGNOSIS — Z7901 Long term (current) use of anticoagulants: Secondary | ICD-10-CM

## 2013-11-01 DIAGNOSIS — Z5181 Encounter for therapeutic drug level monitoring: Secondary | ICD-10-CM

## 2013-11-01 LAB — POCT INR: INR: 1.9

## 2013-11-07 ENCOUNTER — Encounter: Payer: Medicare Other | Admitting: *Deleted

## 2013-11-18 ENCOUNTER — Encounter: Payer: Self-pay | Admitting: Internal Medicine

## 2013-11-18 ENCOUNTER — Ambulatory Visit (INDEPENDENT_AMBULATORY_CARE_PROVIDER_SITE_OTHER): Payer: Medicare Other | Admitting: Pharmacist

## 2013-11-18 ENCOUNTER — Ambulatory Visit (INDEPENDENT_AMBULATORY_CARE_PROVIDER_SITE_OTHER): Payer: Medicare Other | Admitting: Internal Medicine

## 2013-11-18 ENCOUNTER — Other Ambulatory Visit: Payer: Self-pay | Admitting: *Deleted

## 2013-11-18 VITALS — BP 131/64 | HR 70 | Ht 63.0 in | Wt 219.0 lb

## 2013-11-18 DIAGNOSIS — I4891 Unspecified atrial fibrillation: Secondary | ICD-10-CM

## 2013-11-18 DIAGNOSIS — I4892 Unspecified atrial flutter: Secondary | ICD-10-CM

## 2013-11-18 DIAGNOSIS — Z5181 Encounter for therapeutic drug level monitoring: Secondary | ICD-10-CM

## 2013-11-18 DIAGNOSIS — Z7901 Long term (current) use of anticoagulants: Secondary | ICD-10-CM

## 2013-11-18 DIAGNOSIS — I471 Supraventricular tachycardia: Secondary | ICD-10-CM

## 2013-11-18 DIAGNOSIS — I4719 Other supraventricular tachycardia: Secondary | ICD-10-CM

## 2013-11-18 DIAGNOSIS — I5043 Acute on chronic combined systolic (congestive) and diastolic (congestive) heart failure: Secondary | ICD-10-CM

## 2013-11-18 DIAGNOSIS — I5042 Chronic combined systolic (congestive) and diastolic (congestive) heart failure: Secondary | ICD-10-CM

## 2013-11-18 DIAGNOSIS — I498 Other specified cardiac arrhythmias: Secondary | ICD-10-CM

## 2013-11-18 DIAGNOSIS — I509 Heart failure, unspecified: Secondary | ICD-10-CM

## 2013-11-18 DIAGNOSIS — I5022 Chronic systolic (congestive) heart failure: Secondary | ICD-10-CM

## 2013-11-18 LAB — MDC_IDC_ENUM_SESS_TYPE_INCLINIC
Brady Statistic RA Percent Paced: 96 %
Brady Statistic RV Percent Paced: 99.97 %
Date Time Interrogation Session: 20150518182402
Implantable Pulse Generator Model: 3210
Lead Channel Impedance Value: 487.5 Ohm
Lead Channel Impedance Value: 550 Ohm
Lead Channel Pacing Threshold Amplitude: 1 V
Lead Channel Pacing Threshold Amplitude: 1.25 V
Lead Channel Pacing Threshold Amplitude: 1.25 V
Lead Channel Pacing Threshold Amplitude: 1.75 V
Lead Channel Pacing Threshold Pulse Width: 0.4 ms
Lead Channel Pacing Threshold Pulse Width: 0.4 ms
Lead Channel Pacing Threshold Pulse Width: 1 ms
Lead Channel Pacing Threshold Pulse Width: 1 ms
Lead Channel Sensing Intrinsic Amplitude: 0.5 mV
Lead Channel Setting Pacing Amplitude: 2 V
Lead Channel Setting Pacing Amplitude: 2.75 V
Lead Channel Setting Pacing Pulse Width: 1 ms
MDC IDC MSMT BATTERY REMAINING LONGEVITY: 33.6 mo
MDC IDC MSMT BATTERY VOLTAGE: 2.9 V
MDC IDC MSMT LEADCHNL LV PACING THRESHOLD AMPLITUDE: 1.75 V
MDC IDC MSMT LEADCHNL RA IMPEDANCE VALUE: 412.5 Ohm
MDC IDC MSMT LEADCHNL RA PACING THRESHOLD AMPLITUDE: 1 V
MDC IDC MSMT LEADCHNL RA PACING THRESHOLD PULSEWIDTH: 0.4 ms
MDC IDC MSMT LEADCHNL RV PACING THRESHOLD PULSEWIDTH: 0.4 ms
MDC IDC MSMT LEADCHNL RV SENSING INTR AMPL: 3.6 mV
MDC IDC PG SERIAL: 2853392
MDC IDC SET LEADCHNL RV PACING AMPLITUDE: 2.5 V
MDC IDC SET LEADCHNL RV PACING PULSEWIDTH: 0.4 ms
MDC IDC SET LEADCHNL RV SENSING SENSITIVITY: 1.5 mV

## 2013-11-18 LAB — POCT INR: INR: 1.9

## 2013-11-18 MED ORDER — WARFARIN SODIUM 5 MG PO TABS: ORAL_TABLET | ORAL | Status: DC

## 2013-11-18 NOTE — Assessment & Plan Note (Signed)
She has maintained NSR on low dose amiodarone. She will continue her current meds.

## 2013-11-18 NOTE — Progress Notes (Signed)
HPI [name] returns today for followup. She is a patient of Dr. Doug Sou. She has a history of tachycardia mediated cardiomyopathy, and was thought to have chronic atrial fibrillation. She underwent AV node ablation and biventricular pacemaker insertion one year ago. Her ejection fraction improved from 30% to 45%. Her heart her symptoms also improved. Over the last few months, she has had some increasing dyspnea and worsening peripheral edema. Interestingly enough, review of her histograms of her pacemaker demonstrates that she in fact goes back into sinus rhythm for months at a time. Over the last 3 months however she has been in NSR. The patient has not had syncope. She admits to some dietary indiscretion. r daughter who is with her today states that she eats way too much salt.  I restarted the patient on amiodarone 5 months ago. She has tolerated this nicely. She has maintained NSR and feels better despite her diet and medication indiscretion. Allergies  Allergen Reactions  . Betadine [Povidone Iodine] Itching  . Capoten [Captopril] Other (See Comments)    unknown  . Codone [Hydrocodone] Nausea And Vomiting  . Methadone Nausea And Vomiting  . Penicillins Swelling  . Propoxyphene And Methadone     Intolerance to darvocet     Current Outpatient Prescriptions  Medication Sig Dispense Refill  . amiodarone (PACERONE) 200 MG tablet Take 0.5 tablets (100 mg total) by mouth daily.  45 tablet  3  . amitriptyline (ELAVIL) 25 MG tablet Take 75 mg by mouth at bedtime.        Marland Kitchen atorvastatin (LIPITOR) 40 MG tablet Take 40 mg by mouth every evening.       . diltiazem (CARDIZEM CD) 240 MG 24 hr capsule Take 1 capsule (240 mg total) by mouth daily.  30 capsule  11  . donepezil (ARICEPT) 5 MG tablet Take 5 mg by mouth at bedtime.      Marland Kitchen esomeprazole (NEXIUM) 40 MG capsule Take 40 mg by mouth daily before breakfast.        . ezetimibe (ZETIA) 10 MG tablet Take 10 mg by mouth every evening.        . furosemide (LASIX) 40 MG tablet Take 1.5 tablets (60 mg total) by mouth 2 (two) times daily.  90 tablet  6  . insulin glargine (LANTUS) 100 UNIT/ML injection Inject 45 Units into the skin 2 (two) times daily.       . insulin lispro (HUMALOG) 100 UNIT/ML injection Inject 0-20 Units into the skin 2 (two) times daily as needed for high blood sugar. Home sliding scale If blood sugar is <200 take 15 units If blood sugar is >200 take 20 units If blood sugar is less than 70, don't take any      . levothyroxine (SYNTHROID, LEVOTHROID) 100 MCG tablet Take 100 mcg by mouth every morning.       . metoprolol succinate (TOPROL-XL) 50 MG 24 hr tablet Take 1 tablet (50 mg total) by mouth every morning.  30 tablet  6  . nitroGLYCERIN (NITROSTAT) 0.4 MG SL tablet Place 0.4 mg under the tongue every 5 (five) minutes as needed. For chest pain      . potassium chloride SA (K-DUR,KLOR-CON) 20 MEQ tablet Take 1 tablet (20 mEq total) by mouth every morning.  30 tablet  3  . warfarin (COUMADIN) 5 MG tablet Take as directed by coumadin clinic  70 tablet  3  . [DISCONTINUED] enoxaparin (LOVENOX) 100 MG/ML SOLN Inject into the skin  every 12 (twelve) hours.        . [DISCONTINUED] simvastatin (ZOCOR) 80 MG tablet Take 80 mg by mouth at bedtime.         No current facility-administered medications for this visit.     Past Medical History  Diagnosis Date  . Coronary artery disease     a. Severe two vessel; s/p CABG (LIMA-LAD, SVG-diag, SVG-PDA);  b. 05/2012 NSTEMI in setting of rapid aflutter;  c. 06/2012 Lexiscan cardiolite: small, mild reversible defect in lateral wall->mild ischemia, low-risk->med Rx.  Marland Kitchen Hypertension   . Type II diabetes mellitus   . Hyperlipidemia   . Diabetic neuropathy   . CVA (cerebral vascular accident) 2004  . Hypothyroidism   . Chronic anemia   . GERD (gastroesophageal reflux disease)   . Arthritis   . Memory loss, short term   . COPD (chronic obstructive pulmonary disease)   .  Blood transfusion without reported diagnosis   . Chronic combined systolic and diastolic CHF (congestive heart failure)     a. 07/2012 Echo: EF 30%, Gr II DD, Mild AS/MR, Mod-Sev TR, mild bi-atrial and RV dil, PASP 89mmHg.  . Moderate mitral regurgitation     a. mod by TEE 2012, mild by echo 2013, 07/2012  . Severe tricuspid regurgitation     a. Severe by TEE 2012, mod by echo 2013, mod-sev by echo 07/2012  . Chronic vulvitis 07/1993  . Idiopathic anemia 2006  . Atrial fibrillation     a. Failed amiodarone (prolonged QT), not a candidate for class IC meds due to her CAD. No Multaq due to CHF hx;  b. 07/2012 s/p SJM Anthem RF Bi-V PPM, ser # [id number] and AV node RFCA;  c. chronic coumadin.  . Atrial flutter   . Pacemaker   . GI bleed     a. 10/2011 - Dr Benson Norway did scope which showed hiatal hernia, colon revealed diverticula (diverticular bleed), internal/ext hemorrhoids.    ROS:   All systems reviewed and negative except as noted in the HPI.   Past Surgical History  Procedure Laterality Date  . Coronary artery bypass graft  06/2009    X3, LIMA to LAD, SVG to diagonal, SVG to the posterior descending artery.  . Tonsillectomy    . Knee arthroscopy  08/2001  . Transthoracic echocardiogram  11/12/2010     Ejection fraction felt to be around 50%.  Wall thickness was increased in a pattern of mild LVH.  Moderately dilated left atrium.  Mildly dilated right atrium. Right ventricle was mildly dilated with mildly reduced systolic function  . Cardiac catheterization  06/09/2009    inferior wall hypokinesia with ejection fraction of 55%.  . Abdominal hysterectomy    . Joint replacement    . Tee without cardioversion  05/23/2011    Procedure: TRANSESOPHAGEAL ECHOCARDIOGRAM (TEE);  Surgeon: Lelon Perla, MD;  Location: [facility];  Service: Cardiovascular;  Laterality: N/A;  . Incision and drainage abscess  05/07/2012    Procedure: INCISION AND DRAINAGE ABSCESS;  Surgeon: Adin Hector, MD;   Location: [facility];  Service: General;  Laterality: N/A;  Groin wound  . Total abdominal hysterectomy  1979  . Breast biopsy  Left  . Bilateral salpingectomy  10/2000  . Breast biopsy  09/18/2009    fibrocystic change  . Coronary artery bypass graft  06/2009  . Pacemaker insertion  08/2012  . Colonoscopy N/A 01/19/2013    Procedure: COLONOSCOPY;  Surgeon: Inda Castle, MD;  Location: [facility];  Service: Endoscopy;  Laterality: N/A;  . Esophagogastroduodenoscopy N/A 01/19/2013    Procedure: ESOPHAGOGASTRODUODENOSCOPY (EGD);  Surgeon: Inda Castle, MD;  Location: [facility];  Service: Endoscopy;  Laterality: N/A;     Family History  Problem Relation Age of Onset  . Colon cancer Mother   . Stroke Mother   . Hypertension Mother   . Cancer Mother     breast  . Cancer Father     colon  . Heart attack Father     x2  . Renal Disease Sister     dialysis-twin sister  . Renal Disease Sister     renal bypass     History   Social History  . Marital Status: Married    Spouse Name: N/A    Number of Children: 2  . Years of Education: N/A   Occupational History  . teacher    Social History Main Topics  . Smoking status: Former Smoker    Types: Cigarettes  . Smokeless tobacco: Never Used  . Alcohol Use: No  . Drug Use: No  . Sexual Activity: No     Comment: hysterectomy   Other Topics Concern  . Not on file   Social History Narrative   Lives in [place] with spouse     BP 131/64  Pulse 70  Ht 5\' 3"  (1.6 m)  Wt 219 lb (99.338 kg)  BMI 38.80 kg/m2  Physical Exam:  Well appearing elderly woman, NAD HEENT: Unremarkable Neck:  No JVD, no thyromegally Back:  No CVA tenderness Lungs:  Clear with no wheezes or rhonchi. Scattered basilar rales. HEART:  Regular rate rhythm, with a soft systolic murmur, no rubs, no clicks Abd:  soft, positive bowel sounds, no organomegally, no rebound, no guarding Ext:  2 plus pulses, 2+ peripheral edema, no cyanosis, no  clubbing Skin:  No rashes no nodules Neuro:  CN II through XII intact, motor grossly intact   DEVICE  Normal device function.  See PaceArt for details.   Assess/Plan:

## 2013-11-18 NOTE — Assessment & Plan Note (Signed)
His heart failure is class 2 despite her severe dietary indiscretion. I have counseled the patient and her daughter to stop eating country ham, and bacon.

## 2013-11-18 NOTE — Patient Instructions (Signed)
Remote monitoring is used to monitor your pacemaker from home. This monitoring reduces the number of office visits required to check your device to one time per year. It allows [place] to keep an eye on the functioning of your device to ensure it is working properly. You are scheduled for a device check from home on 02-17-2014. You may send your transmission at any time that day. If you have a wireless device, the transmission will be sent automatically. After your physician reviews your transmission, you will receive a postcard with your next transmission date.  Your physician recommends that you schedule a follow-up appointment in: 12 months with Dr.Taylor

## 2013-11-19 ENCOUNTER — Encounter: Payer: Self-pay | Admitting: Internal Medicine

## 2013-11-27 ENCOUNTER — Other Ambulatory Visit (HOSPITAL_BASED_OUTPATIENT_CLINIC_OR_DEPARTMENT_OTHER): Payer: Medicare Other

## 2013-11-27 ENCOUNTER — Telehealth: Payer: Self-pay | Admitting: Internal Medicine

## 2013-11-27 ENCOUNTER — Ambulatory Visit (HOSPITAL_BASED_OUTPATIENT_CLINIC_OR_DEPARTMENT_OTHER): Payer: Medicare Other | Admitting: Internal Medicine

## 2013-11-27 VITALS — BP 123/51 | HR 72 | Temp 97.6°F | Resp 20 | Ht 63.0 in | Wt 219.6 lb

## 2013-11-27 DIAGNOSIS — D472 Monoclonal gammopathy: Secondary | ICD-10-CM

## 2013-11-27 DIAGNOSIS — D649 Anemia, unspecified: Secondary | ICD-10-CM

## 2013-11-27 DIAGNOSIS — D631 Anemia in chronic kidney disease: Secondary | ICD-10-CM

## 2013-11-27 DIAGNOSIS — N189 Chronic kidney disease, unspecified: Secondary | ICD-10-CM

## 2013-11-27 DIAGNOSIS — N039 Chronic nephritic syndrome with unspecified morphologic changes: Secondary | ICD-10-CM

## 2013-11-27 LAB — CBC WITH DIFFERENTIAL/PLATELET
BASO%: 0.3 % (ref 0.0–2.0)
Basophils Absolute: 0 10*3/uL (ref 0.0–0.1)
EOS%: 2.3 % (ref 0.0–7.0)
Eosinophils Absolute: 0.1 10*3/uL (ref 0.0–0.5)
HEMATOCRIT: 29.6 % — AB (ref 34.8–46.6)
HGB: 9.4 g/dL — ABNORMAL LOW (ref 11.6–15.9)
LYMPH%: 20 % (ref 14.0–49.7)
MCH: 27.9 pg (ref 25.1–34.0)
MCHC: 31.8 g/dL (ref 31.5–36.0)
MCV: 87.8 fL (ref 79.5–101.0)
MONO#: 1 10*3/uL — ABNORMAL HIGH (ref 0.1–0.9)
MONO%: 15.9 % — AB (ref 0.0–14.0)
NEUT#: 3.7 10*3/uL (ref 1.5–6.5)
NEUT%: 61.5 % (ref 38.4–76.8)
Platelets: 195 10*3/uL (ref 145–400)
RBC: 3.37 10*6/uL — ABNORMAL LOW (ref 3.70–5.45)
RDW: 15.5 % — ABNORMAL HIGH (ref 11.2–14.5)
WBC: 6.1 10*3/uL (ref 3.9–10.3)
lymph#: 1.2 10*3/uL (ref 0.9–3.3)

## 2013-11-27 LAB — LACTATE DEHYDROGENASE (CC13): LDH: 254 U/L — AB (ref 125–245)

## 2013-11-27 LAB — COMPREHENSIVE METABOLIC PANEL (CC13)
ALT: 19 U/L (ref 0–55)
AST: 28 U/L (ref 5–34)
Albumin: 3.1 g/dL — ABNORMAL LOW (ref 3.5–5.0)
Alkaline Phosphatase: 114 U/L (ref 40–150)
Anion Gap: 11 mEq/L (ref 3–11)
BILIRUBIN TOTAL: 0.33 mg/dL (ref 0.20–1.20)
BUN: 42.5 mg/dL — ABNORMAL HIGH (ref 7.0–26.0)
CO2: 27 mEq/L (ref 22–29)
CREATININE: 2.1 mg/dL — AB (ref 0.6–1.1)
Calcium: 8.9 mg/dL (ref 8.4–10.4)
Chloride: 102 mEq/L (ref 98–109)
Glucose: 209 mg/dl — ABNORMAL HIGH (ref 70–140)
Potassium: 3.8 mEq/L (ref 3.5–5.1)
Sodium: 141 mEq/L (ref 136–145)
Total Protein: 7.8 g/dL (ref 6.4–8.3)

## 2013-11-27 MED ORDER — DARBEPOETIN ALFA-POLYSORBATE 300 MCG/0.6ML IJ SOLN
300.0000 ug | Freq: Once | INTRAMUSCULAR | Status: AC
  Administered 2013-11-27: 300 ug via SUBCUTANEOUS
  Filled 2013-11-27: qty 0.6

## 2013-11-27 NOTE — Progress Notes (Signed)
[facility] OFFICE PROGRESS NOTE  [name], MD [place], [place]. [place] [address]  DIAGNOSIS: Anemia associated with chronic renal failure  Chief Complaint  Patient presents with  . Follow-up    CURRENT THERAPY: Aranesp 200 mcg subcutaneous prn for a hemoglobin less than 11.  Increased to 300 mg subcutaneous this visit.   INTERVAL HISTORY: [name] 80 y.o. female with a history of IgG lambda MGUS, Anemia associated with chronic renal failure who presents for follow up. She was last seen by me on 10/28/2013. Today, she is accompanied by her daughter.  She had a regular pacemaker check.  She reports that cardiology stated it looked good. She denies any abnormal heart rhythms. She denies any recent hospitalizations or emergency room visits. She denies rectal bleeding. She is compliant to anticoagulation.  She continues aranesp 200 mcg subcutaneous every month for a hemogloblin less than 11. She denies fevers of chills or acute shortness of breath. She also denies chest pain.  MEDICAL HISTORY: Past Medical History  Diagnosis Date  . Coronary artery disease     a. Severe two vessel; s/p CABG (LIMA-LAD, SVG-diag, SVG-PDA);  b. 05/2012 NSTEMI in setting of rapid aflutter;  c. 06/2012 Lexiscan cardiolite: small, mild reversible defect in lateral wall->mild ischemia, low-risk->med Rx.  Marland Kitchen Hypertension   . Type II diabetes mellitus   . Hyperlipidemia   . Diabetic neuropathy   . CVA (cerebral vascular accident) 2004  . Hypothyroidism   . Chronic anemia   . GERD (gastroesophageal reflux disease)   . Arthritis   . Memory loss, short term   . COPD (chronic obstructive pulmonary disease)   . Blood transfusion without reported diagnosis   . Chronic combined systolic and diastolic CHF (congestive heart failure)     a. 07/2012 Echo: EF 30%, Gr II DD, Mild AS/MR, Mod-Sev TR, mild bi-atrial and RV dil, PASP 61mmHg.  . Moderate mitral  regurgitation     a. mod by TEE 2012, mild by echo 2013, 07/2012  . Severe tricuspid regurgitation     a. Severe by TEE 2012, mod by echo 2013, mod-sev by echo 07/2012  . Chronic vulvitis 07/1993  . Idiopathic anemia 2006  . Atrial fibrillation     a. Failed amiodarone (prolonged QT), not a candidate for class IC meds due to her CAD. No Multaq due to CHF hx;  b. 07/2012 s/p SJM Anthem RF Bi-V PPM, ser # [id number] and AV node RFCA;  c. chronic coumadin.  . Atrial flutter   . Pacemaker   . GI bleed     a. 10/2011 - Dr Benson Norway did scope which showed hiatal hernia, colon revealed diverticula (diverticular bleed), internal/ext hemorrhoids.    INTERIM HISTORY: has Atrial fibrillation; Hypothyroidism; Chronic anticoagulation; CAD (coronary artery disease); Diastolic dysfunction; Anemia associated with chronic renal failure; Atrial tachycardia; Right heart failure; MGUS (monoclonal gammopathy of unknown significance); GI bleed; CKD (chronic kidney disease) stage 3, GFR 30-59 ml/min; Abscess of right thigh s/p I&D 46/03/6294; Chronic systolic CHF (congestive heart failure); Atrial flutter; Long term (current) use of anticoagulants; Acute on chronic combined systolic and diastolic congestive heart failure, NYHA class 4; Diabetes mellitus; Chronic anemia; Cardiomyopathy-cause unknown ? rate related v ischemic; Acute respiratory failure; Pulmonary edema; Pleural effusion; Atrial fibrillation with RVR; Physical deconditioning; Fever; Pneumonia; Weakness; Bacteremia due to Escherichia coli; Coronary artery disease; Hx of CABG; Chronic combined systolic and diastolic congestive heart failure; Diabetes mellitus type 2 with complications; Acute  on chronic renal failure; Pacemaker; DOE (dyspnea on exertion); Benign neoplasm of stomach; Adenomatous duodenal polyp; Diverticulosis of colon (without mention of hemorrhage); Symptomatic anemia; and Encounter for therapeutic drug monitoring on her problem list.    ALLERGIES:  is  allergic to betadine; capoten; codone; methadone; penicillins; and propoxyphene and methadone.  MEDICATIONS: has a current medication list which includes the following prescription(s): amiodarone, amitriptyline, atorvastatin, diltiazem, donepezil, esomeprazole, ezetimibe, furosemide, insulin glargine, insulin lispro, levothyroxine, metoprolol succinate, nitroglycerin, potassium chloride sa, and warfarin.  SURGICAL HISTORY:  Past Surgical History  Procedure Laterality Date  . Coronary artery bypass graft  06/2009    X3, LIMA to LAD, SVG to diagonal, SVG to the posterior descending artery.  . Tonsillectomy    . Knee arthroscopy  08/2001  . Transthoracic echocardiogram  11/12/2010     Ejection fraction felt to be around 50%.  Wall thickness was increased in a pattern of mild LVH.  Moderately dilated left atrium.  Mildly dilated right atrium. Right ventricle was mildly dilated with mildly reduced systolic function  . Cardiac catheterization  06/09/2009    inferior wall hypokinesia with ejection fraction of 55%.  . Abdominal hysterectomy    . Joint replacement    . Tee without cardioversion  05/23/2011    Procedure: TRANSESOPHAGEAL ECHOCARDIOGRAM (TEE);  Surgeon: Lelon Perla, MD;  Location: [facility];  Service: Cardiovascular;  Laterality: N/A;  . Incision and drainage abscess  05/07/2012    Procedure: INCISION AND DRAINAGE ABSCESS;  Surgeon: Adin Hector, MD;  Location: [facility];  Service: General;  Laterality: N/A;  Groin wound  . Total abdominal hysterectomy  1979  . Breast biopsy  Left  . Bilateral salpingectomy  10/2000  . Breast biopsy  09/18/2009    fibrocystic change  . Coronary artery bypass graft  06/2009  . Pacemaker insertion  08/2012  . Colonoscopy N/A 01/19/2013    Procedure: COLONOSCOPY;  Surgeon: Inda Castle, MD;  Location: [facility];  Service: Endoscopy;  Laterality: N/A;  . Esophagogastroduodenoscopy N/A 01/19/2013    Procedure: ESOPHAGOGASTRODUODENOSCOPY (EGD);   Surgeon: Inda Castle, MD;  Location: [facility];  Service: Endoscopy;  Laterality: N/A;    REVIEW OF SYSTEMS:   Constitutional: Denies fevers, chills or abnormal weight loss Eyes: Denies blurriness of vision Ears, nose, mouth, throat, and face: Denies mucositis or sore throat Respiratory: Denies cough, dyspnea or wheezes Cardiovascular: Denies palpitation, chest discomfort or lower extremity swelling Gastrointestinal:  Denies nausea, heartburn or change in bowel habits Skin: Denies abnormal skin rashes Lymphatics: Denies new lymphadenopathy or easy bruising Neurological:Denies numbness, tingling or new weaknesses Behavioral/Psych: Mood is stable, no new changes  All other systems were reviewed with the patient and are negative.  PHYSICAL EXAMINATION: ECOG PERFORMANCE STATUS: 1 - Symptomatic but completely ambulatory  Blood pressure 123/51, pulse 72, temperature 97.6 F (36.4 C), temperature source Oral, resp. rate 20, height 5\' 3"  (1.6 m), weight 219 lb 9.6 oz (99.61 kg), SpO2 100.00%.  GENERAL:alert, no distress and comfortable; moderately obese SKIN: skin color, texture, turgor are normal, no rashes or significant lesions EYES: normal, Conjunctiva are pink and non-injected, sclera clear OROPHARYNX:no exudate, no erythema and lips, buccal mucosa, and tongue normal  NECK: supple, thyroid normal size, non-tender, without nodularity LYMPH:  no palpable lymphadenopathy in the cervical, axillary or supraclavicular LUNGS: clear to auscultation and percussion with normal breathing effort HEART:  RR with pacemaker in the left infraclavicular area. Chronic lower extremity brawny edema with stasis changes.  ABDOMEN:abdomen soft,  non-tender and normal bowel sounds Musculoskeletal:no cyanosis of digits and no clubbing  NEURO: alert & oriented x 3 with fluent speech, no focal motor/sensory deficits  Labs:  Lab Results  Component Value Date   WBC 6.1 11/27/2013   HGB 9.4* 11/27/2013    HCT 29.6* 11/27/2013   MCV 87.8 11/27/2013   PLT 195 11/27/2013   NEUTROABS 3.7 11/27/2013      Chemistry      Component Value Date/Time   NA 141 11/27/2013 1254   NA 137 01/20/2013 0443   K 3.8 11/27/2013 1254   K 3.5 01/20/2013 0443   CL 100 01/20/2013 0443   CL 105 08/30/2012 1108   CO2 27 11/27/2013 1254   CO2 26 01/20/2013 0443   BUN 42.5* 11/27/2013 1254   BUN 24* 01/20/2013 0443   CREATININE 2.1* 11/27/2013 1254   CREATININE 1.63* 01/20/2013 0443   CREATININE 1.68* 05/16/2011 1352      Component Value Date/Time   CALCIUM 8.9 11/27/2013 1254   CALCIUM 8.9 01/20/2013 0443   ALKPHOS 114 11/27/2013 1254   ALKPHOS 145* 08/07/2012 0445   AST 28 11/27/2013 1254   AST 29 08/07/2012 0445   ALT 19 11/27/2013 1254   ALT 17 08/07/2012 0445   BILITOT 0.33 11/27/2013 1254   BILITOT 0.4 08/07/2012 0445     CBC:  Recent Labs Lab 11/27/13 1254  WBC 6.1  NEUTROABS 3.7  HGB 9.4*  HCT 29.6*  MCV 87.8  PLT 195   Studies:  No results found.   RADIOGRAPHIC STUDIES: No results found.  ASSESSMENT: Mickelle Goupil Janczak 80 y.o. female with a history of Anemia associated with chronic renal failure   PLAN:  1. Anemia of chronic renal failure.  --She feels less symptomatic.  She received one unit of 4/15.  Her hemoglobin is 9.4 today stable from 9.5 last visit.  She received her aranesp 200 mcg subcutaneous shot on 04/27 and will receive 300 mcg on today. It was increased to 300 mcg monthly due to increasing blood transfusions requirements. She will continue to have CBCs every 4 weeks and have aranesp as indicated.  Her history is also notable for angiodyplasia and diverticulosis and hemorrhoids resulting in heme positive stool.    2. Afib/CHF/Pacer --Followed by cardiology.  Her medtronic biventricular pacemaker was working normally on 12/09 and recently checked 05/18.  She was started on amiodarone 100 mg daily and will continue at that dose.  3. CKDz. --Her creatinine is 2.1 up from 1.8 last visit.   Creatinine clearance is 24.5 mL/min.    4. MGUS. --Her IgG levels remains stable. She denies any symptoms including bone pain.  5. Follow-up.   --Patient will return to clinic in 4 weeks for CBC, CMP and iron studies.   She had a colonoscopy with Dr. Sandy Salaam. [facility] on 01/19/2013.   All questions were answered. The patient knows to call the clinic with any problems, questions or concerns. We can certainly see the patient much sooner if necessary.  I spent 15 minutes counseling the patient face to face. The total time spent in the appointment was 25 minutes.    Concha Norway, MD 11/27/2013 1:55 PM

## 2013-11-27 NOTE — Telephone Encounter (Signed)
gv dtr appt schedule for june

## 2013-11-29 ENCOUNTER — Other Ambulatory Visit: Payer: Self-pay | Admitting: Cardiology

## 2013-12-02 ENCOUNTER — Ambulatory Visit (INDEPENDENT_AMBULATORY_CARE_PROVIDER_SITE_OTHER): Payer: Medicare Other | Admitting: *Deleted

## 2013-12-02 DIAGNOSIS — Z5181 Encounter for therapeutic drug level monitoring: Secondary | ICD-10-CM

## 2013-12-02 DIAGNOSIS — I4891 Unspecified atrial fibrillation: Secondary | ICD-10-CM

## 2013-12-02 DIAGNOSIS — Z7901 Long term (current) use of anticoagulants: Secondary | ICD-10-CM

## 2013-12-02 LAB — POCT INR: INR: 2.9

## 2013-12-06 IMAGING — CR DG CHEST 2V
2 series · 2 of 2 positions shown · non-contrast
Comparison: 08/09/2012

CLINICAL DATA: Chest pain and weakness

CHEST - 2 VIEW

[w chest lat]
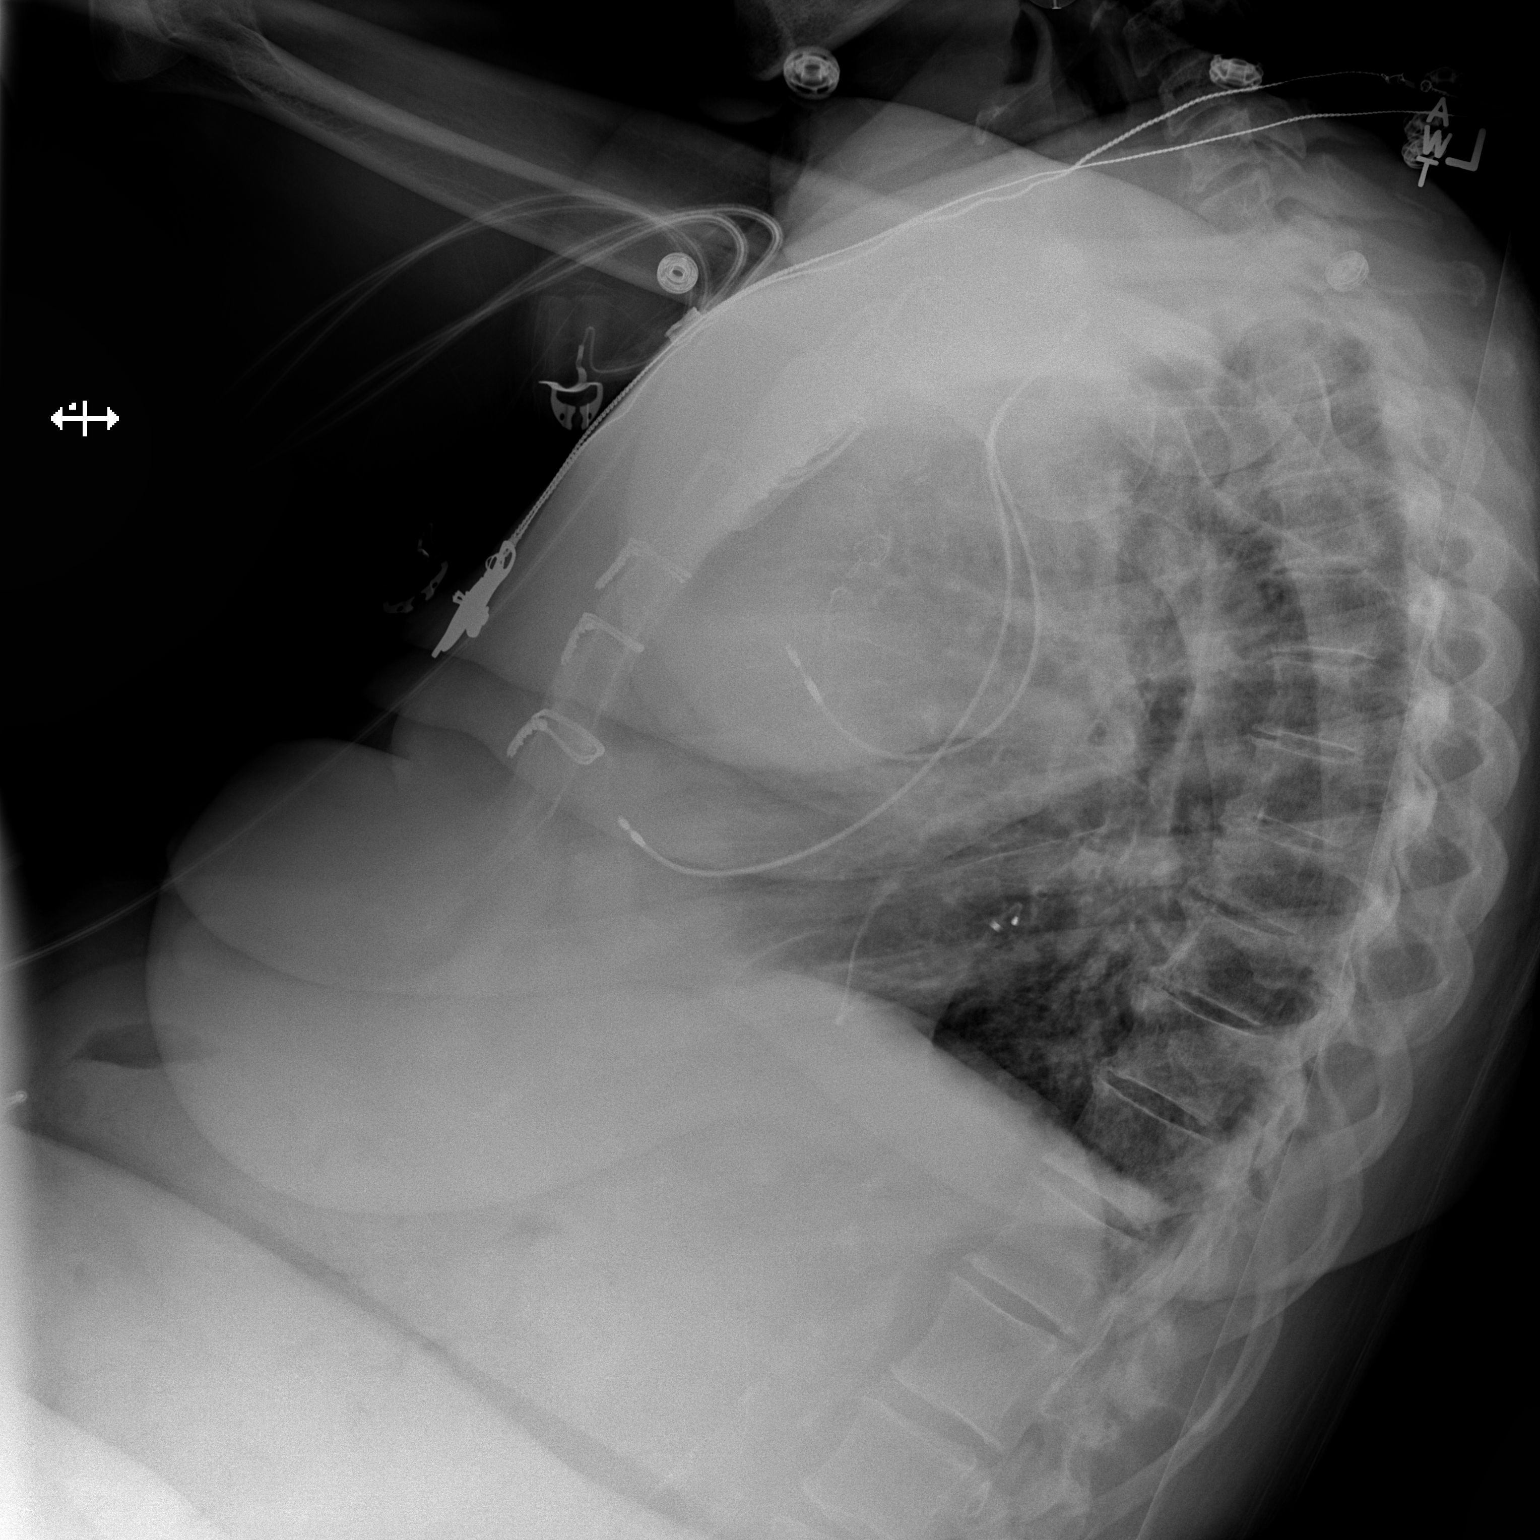

[x chest ap]
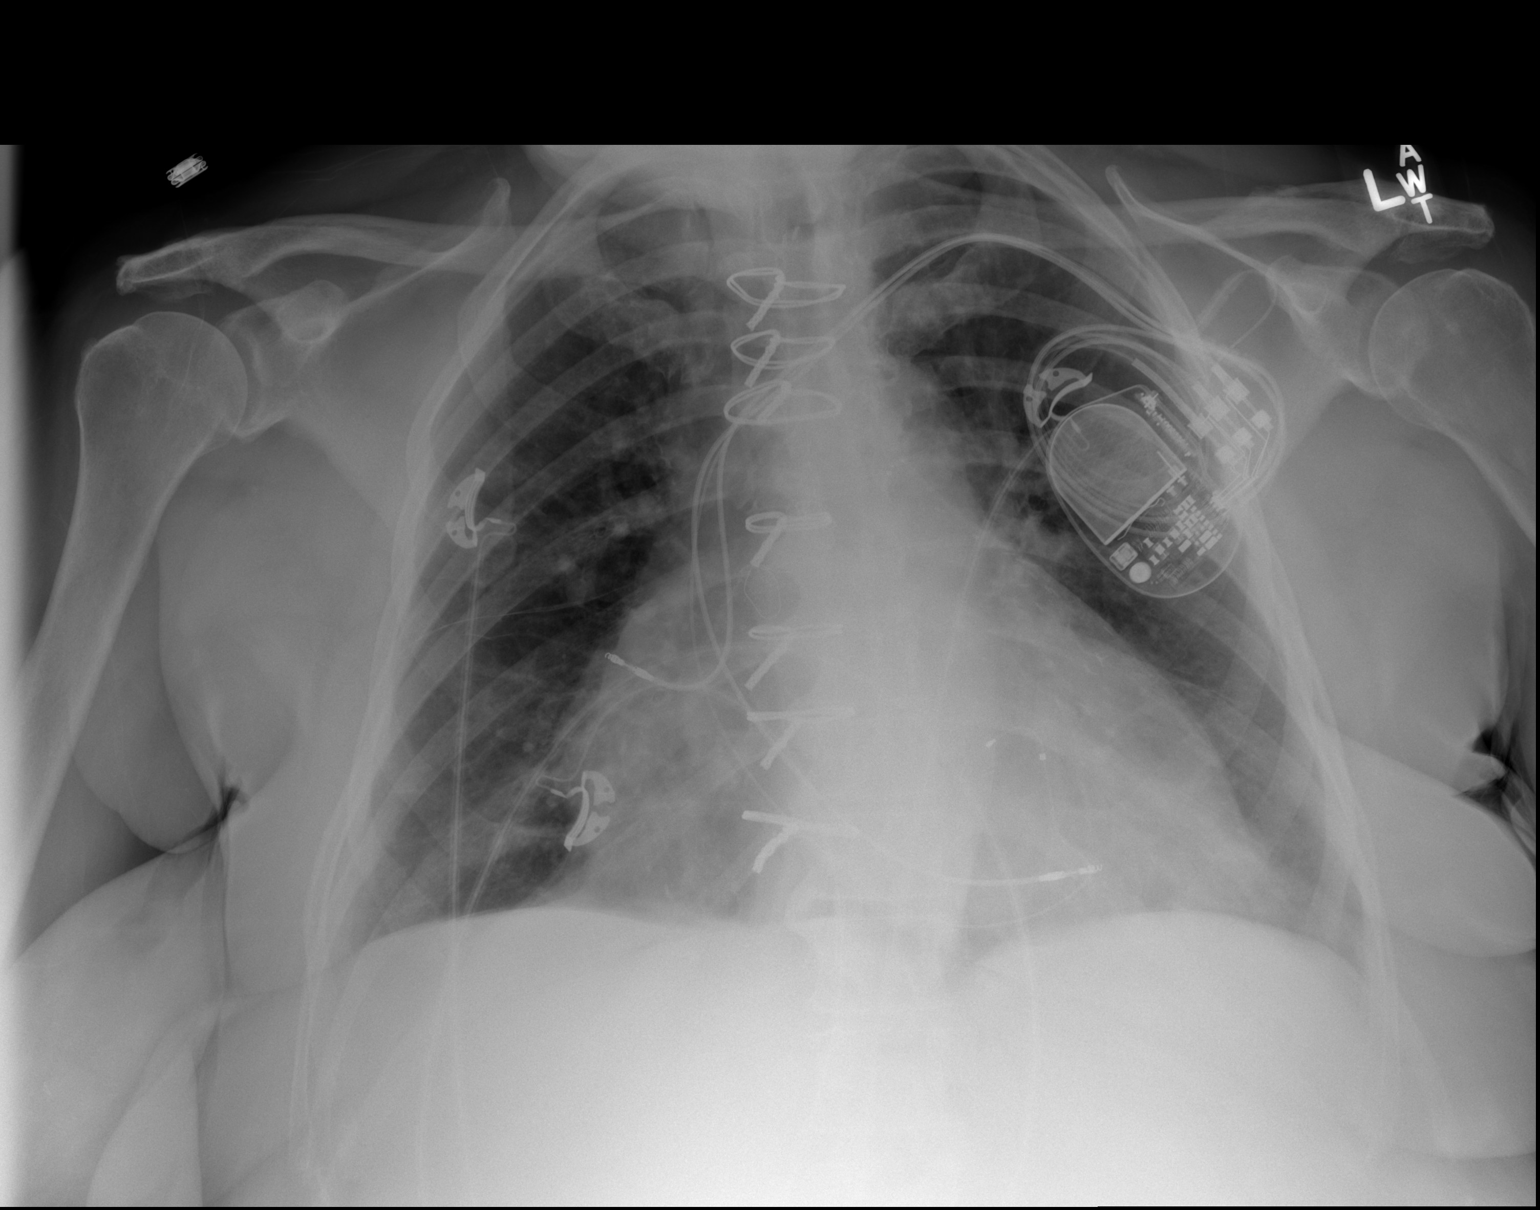

[2 of 2 positions shown; findings below may reference images not displayed]

FINDINGS: There is a left chest wall pacer device with leads in the
right atrial appendage, coronary sinus and right ventricle.

Previous median sternotomy and CABG procedure.

There is mild cardiac enlargement.  No pleural effusion or edema
identified.  No airspace consolidation.
IMPRESSION: 1.  No acute cardiopulmonary abnormalities.

## 2013-12-17 ENCOUNTER — Ambulatory Visit (INDEPENDENT_AMBULATORY_CARE_PROVIDER_SITE_OTHER): Payer: Medicare Other

## 2013-12-17 DIAGNOSIS — Z5181 Encounter for therapeutic drug level monitoring: Secondary | ICD-10-CM

## 2013-12-17 DIAGNOSIS — Z7901 Long term (current) use of anticoagulants: Secondary | ICD-10-CM

## 2013-12-17 DIAGNOSIS — I4891 Unspecified atrial fibrillation: Secondary | ICD-10-CM

## 2013-12-17 LAB — POCT INR: INR: 1.8

## 2013-12-19 ENCOUNTER — Ambulatory Visit: Payer: 59 | Admitting: Obstetrics & Gynecology

## 2013-12-23 ENCOUNTER — Ambulatory Visit: Payer: 59 | Admitting: Podiatry

## 2013-12-23 ENCOUNTER — Ambulatory Visit (INDEPENDENT_AMBULATORY_CARE_PROVIDER_SITE_OTHER): Payer: 59 | Admitting: Podiatry

## 2013-12-23 ENCOUNTER — Encounter: Payer: Self-pay | Admitting: Podiatry

## 2013-12-23 VITALS — BP 167/95 | HR 90 | Resp 19 | Ht 63.0 in | Wt 211.0 lb

## 2013-12-23 DIAGNOSIS — B351 Tinea unguium: Secondary | ICD-10-CM

## 2013-12-23 DIAGNOSIS — E1049 Type 1 diabetes mellitus with other diabetic neurological complication: Secondary | ICD-10-CM

## 2013-12-24 NOTE — Progress Notes (Signed)
Patient ID: [name], female   DOB: [birth year], 80 y.o.   MRN: [id number]  Subjective: This patient presents requesting nail debridement  Objective: The toenails are elongated, discolored, incurvated and hypertrophic x10  Assessment: Onychomycoses x10 Previous noted diabetic peripheral neuropathy Previously noted edema bilaterally  Plan: Nails x10 are debrided without a bleeding  Reappoint at three-month intervals

## 2013-12-26 ENCOUNTER — Telehealth: Payer: Self-pay | Admitting: Internal Medicine

## 2013-12-26 ENCOUNTER — Ambulatory Visit (HOSPITAL_BASED_OUTPATIENT_CLINIC_OR_DEPARTMENT_OTHER): Payer: Medicare Other | Admitting: Internal Medicine

## 2013-12-26 ENCOUNTER — Encounter: Payer: Self-pay | Admitting: Internal Medicine

## 2013-12-26 ENCOUNTER — Ambulatory Visit (HOSPITAL_BASED_OUTPATIENT_CLINIC_OR_DEPARTMENT_OTHER): Payer: Medicare Other

## 2013-12-26 ENCOUNTER — Other Ambulatory Visit (HOSPITAL_BASED_OUTPATIENT_CLINIC_OR_DEPARTMENT_OTHER): Payer: Medicare Other

## 2013-12-26 VITALS — BP 145/71 | HR 96 | Temp 97.6°F | Resp 19 | Ht 63.0 in | Wt 218.4 lb

## 2013-12-26 DIAGNOSIS — N189 Chronic kidney disease, unspecified: Secondary | ICD-10-CM

## 2013-12-26 DIAGNOSIS — D472 Monoclonal gammopathy: Secondary | ICD-10-CM

## 2013-12-26 DIAGNOSIS — I4891 Unspecified atrial fibrillation: Secondary | ICD-10-CM

## 2013-12-26 DIAGNOSIS — I5043 Acute on chronic combined systolic (congestive) and diastolic (congestive) heart failure: Secondary | ICD-10-CM

## 2013-12-26 DIAGNOSIS — D631 Anemia in chronic kidney disease: Secondary | ICD-10-CM

## 2013-12-26 DIAGNOSIS — D649 Anemia, unspecified: Secondary | ICD-10-CM

## 2013-12-26 DIAGNOSIS — N183 Chronic kidney disease, stage 3 (moderate): Secondary | ICD-10-CM

## 2013-12-26 DIAGNOSIS — N039 Chronic nephritic syndrome with unspecified morphologic changes: Secondary | ICD-10-CM

## 2013-12-26 LAB — COMPREHENSIVE METABOLIC PANEL (CC13)
ALBUMIN: 3.1 g/dL — AB (ref 3.5–5.0)
ALK PHOS: 97 U/L (ref 40–150)
ALT: 34 U/L (ref 0–55)
AST: 26 U/L (ref 5–34)
Anion Gap: 10 mEq/L (ref 3–11)
BUN: 51.5 mg/dL — ABNORMAL HIGH (ref 7.0–26.0)
CO2: 25 mEq/L (ref 22–29)
Calcium: 8.3 mg/dL — ABNORMAL LOW (ref 8.4–10.4)
Chloride: 99 mEq/L (ref 98–109)
Creatinine: 2.4 mg/dL — ABNORMAL HIGH (ref 0.6–1.1)
GLUCOSE: 342 mg/dL — AB (ref 70–140)
POTASSIUM: 4.1 meq/L (ref 3.5–5.1)
SODIUM: 134 meq/L — AB (ref 136–145)
TOTAL PROTEIN: 7.5 g/dL (ref 6.4–8.3)
Total Bilirubin: 0.37 mg/dL (ref 0.20–1.20)

## 2013-12-26 LAB — CBC WITH DIFFERENTIAL/PLATELET
BASO%: 0.5 % (ref 0.0–2.0)
Basophils Absolute: 0.1 10*3/uL (ref 0.0–0.1)
EOS ABS: 0.1 10*3/uL (ref 0.0–0.5)
EOS%: 1.2 % (ref 0.0–7.0)
HCT: 27.4 % — ABNORMAL LOW (ref 34.8–46.6)
HGB: 8.9 g/dL — ABNORMAL LOW (ref 11.6–15.9)
LYMPH%: 12.1 % — ABNORMAL LOW (ref 14.0–49.7)
MCH: 28.6 pg (ref 25.1–34.0)
MCHC: 32.4 g/dL (ref 31.5–36.0)
MCV: 88.3 fL (ref 79.5–101.0)
MONO#: 1.4 10*3/uL — AB (ref 0.1–0.9)
MONO%: 14.3 % — ABNORMAL HIGH (ref 0.0–14.0)
NEUT%: 71.9 % (ref 38.4–76.8)
NEUTROS ABS: 6.9 10*3/uL — AB (ref 1.5–6.5)
Platelets: 254 10*3/uL (ref 145–400)
RBC: 3.1 10*6/uL — AB (ref 3.70–5.45)
RDW: 17.7 % — ABNORMAL HIGH (ref 11.2–14.5)
WBC: 9.6 10*3/uL (ref 3.9–10.3)
lymph#: 1.2 10*3/uL (ref 0.9–3.3)

## 2013-12-26 LAB — LACTATE DEHYDROGENASE (CC13): LDH: 266 U/L — AB (ref 125–245)

## 2013-12-26 MED ORDER — DARBEPOETIN ALFA-POLYSORBATE 200 MCG/0.4ML IJ SOLN: 200.0000 ug | Freq: Once | INTRAMUSCULAR | Status: DC

## 2013-12-26 MED ORDER — DARBEPOETIN ALFA-POLYSORBATE 200 MCG/0.4ML IJ SOLN
200.0000 ug | Freq: Once | INTRAMUSCULAR | Status: AC
  Administered 2013-12-26: 200 ug via SUBCUTANEOUS
  Filled 2013-12-26: qty 0.4

## 2013-12-26 NOTE — Telephone Encounter (Signed)
gv and printed appt sched and avs for pt for July and Aug °

## 2013-12-26 NOTE — Progress Notes (Signed)
[facility] PROGRESS NOTE  [name], MD [place] [address]  DIAGNOSIS: MGUS (monoclonal gammopathy of unknown significance)  Acute on chronic combined systolic and diastolic congestive heart failure, NYHA class 4  Chronic anemia - Plan: CBC with Differential, CBC with Differential, Comprehensive metabolic panel (Cmet) - [facility], Lactate dehydrogenase (LDH) - [facility]  Chief Complaint  Patient presents with  . Anemia associated with chronic renal failure    CURRENT THERAPY: Aranesp 200 mcg subcutaneous prn for a hemoglobin less than 11.  Increased to 300 mg subcutaneous this visit on 11/27/2013.   INTERVAL HISTORY: [name] 80 y.o. female with a history of IgG lambda MGUS, Anemia associated with chronic renal failure who presents for follow up. She was last seen by me on 11/27/2013. She reports having a cortizone shot in her left knee with improvement in her symptoms.  She reports baseline dyspnea.  She also saw Dr. Virgina Jock and cardiology without any changes in her medications.    She denies any recent hospitalizations or emergency room visits. She denies rectal bleeding. She is compliant to anticoagulation.  She continues aranesp 300 mcg subcutaneous every month for a hemogloblin less than 11. She denies fevers of chills or acute shortness of breath. She also denies chest pain.  MEDICAL HISTORY: Past Medical History  Diagnosis Date  . Coronary artery disease     a. Severe two vessel; s/p CABG (LIMA-LAD, SVG-diag, SVG-PDA);  b. 05/2012 NSTEMI in setting of rapid aflutter;  c. 06/2012 Lexiscan cardiolite: small, mild reversible defect in lateral wall->mild ischemia, low-risk->med Rx.  Marland Kitchen Hypertension   . Type II diabetes mellitus   . Hyperlipidemia   . Diabetic neuropathy   . CVA (cerebral vascular accident) 2004  . Hypothyroidism   . Chronic anemia   . GERD (gastroesophageal reflux disease)   . Arthritis   . Memory loss, short term   .  COPD (chronic obstructive pulmonary disease)   . Blood transfusion without reported diagnosis   . Chronic combined systolic and diastolic CHF (congestive heart failure)     a. 07/2012 Echo: EF 30%, Gr II DD, Mild AS/MR, Mod-Sev TR, mild bi-atrial and RV dil, PASP 22mmHg.  . Moderate mitral regurgitation     a. mod by TEE 2012, mild by echo 2013, 07/2012  . Severe tricuspid regurgitation     a. Severe by TEE 2012, mod by echo 2013, mod-sev by echo 07/2012  . Chronic vulvitis 07/1993  . Idiopathic anemia 2006  . Atrial fibrillation     a. Failed amiodarone (prolonged QT), not a candidate for class IC meds due to her CAD. No Multaq due to CHF hx;  b. 07/2012 s/p SJM Anthem RF Bi-V PPM, ser # [id number] and AV node RFCA;  c. chronic coumadin.  . Atrial flutter   . Pacemaker   . GI bleed     a. 10/2011 - Dr Benson Norway did scope which showed hiatal hernia, colon revealed diverticula (diverticular bleed), internal/ext hemorrhoids.    INTERIM HISTORY: has Atrial fibrillation; Hypothyroidism; Chronic anticoagulation; CAD (coronary artery disease); Diastolic dysfunction; Anemia associated with chronic renal failure; Atrial tachycardia; Right heart failure; MGUS (monoclonal gammopathy of unknown significance); GI bleed; CKD (chronic kidney disease) stage 3, GFR 30-59 ml/min; Abscess of right thigh s/p I&D 25/12/3891; Chronic systolic CHF (congestive heart failure); Atrial flutter; Long term (current) use of anticoagulants; Acute on chronic combined systolic and diastolic congestive heart failure, NYHA class 4; Diabetes mellitus; Chronic anemia; Cardiomyopathy-cause unknown ?  rate related v ischemic; Acute respiratory failure; Pulmonary edema; Pleural effusion; Atrial fibrillation with RVR; Physical deconditioning; Fever; Pneumonia; Weakness; Bacteremia due to Escherichia coli; Coronary artery disease; Hx of CABG; Chronic combined systolic and diastolic congestive heart failure; Diabetes mellitus type 2 with complications;  Acute on chronic renal failure; Pacemaker; DOE (dyspnea on exertion); Benign neoplasm of stomach; Adenomatous duodenal polyp; Diverticulosis of colon (without mention of hemorrhage); Symptomatic anemia; and Encounter for therapeutic drug monitoring on her problem list.    ALLERGIES:  is allergic to betadine; capoten; codone; methadone; penicillins; and propoxyphene and methadone.  MEDICATIONS: has a current medication list which includes the following prescription(s): amiodarone, amitriptyline, atorvastatin, diltiazem, donepezil, esomeprazole, ezetimibe, furosemide, insulin glargine, insulin lispro, levothyroxine, metoprolol succinate, nitroglycerin, potassium chloride sa, and warfarin.  SURGICAL HISTORY:  Past Surgical History  Procedure Laterality Date  . Coronary artery bypass graft  06/2009    X3, LIMA to LAD, SVG to diagonal, SVG to the posterior descending artery.  . Tonsillectomy    . Knee arthroscopy  08/2001  . Transthoracic echocardiogram  11/12/2010     Ejection fraction felt to be around 50%.  Wall thickness was increased in a pattern of mild LVH.  Moderately dilated left atrium.  Mildly dilated right atrium. Right ventricle was mildly dilated with mildly reduced systolic function  . Cardiac catheterization  06/09/2009    inferior wall hypokinesia with ejection fraction of 55%.  . Abdominal hysterectomy    . Joint replacement    . Tee without cardioversion  05/23/2011    Procedure: TRANSESOPHAGEAL ECHOCARDIOGRAM (TEE);  Surgeon: Lelon Perla, MD;  Location: [facility];  Service: Cardiovascular;  Laterality: N/A;  . Incision and drainage abscess  05/07/2012    Procedure: INCISION AND DRAINAGE ABSCESS;  Surgeon: Adin Hector, MD;  Location: [facility];  Service: General;  Laterality: N/A;  Groin wound  . Total abdominal hysterectomy  1979  . Breast biopsy  Left  . Bilateral salpingectomy  10/2000  . Breast biopsy  09/18/2009    fibrocystic change  . Coronary artery bypass graft   06/2009  . Pacemaker insertion  08/2012  . Colonoscopy N/A 01/19/2013    Procedure: COLONOSCOPY;  Surgeon: Inda Castle, MD;  Location: [facility];  Service: Endoscopy;  Laterality: N/A;  . Esophagogastroduodenoscopy N/A 01/19/2013    Procedure: ESOPHAGOGASTRODUODENOSCOPY (EGD);  Surgeon: Inda Castle, MD;  Location: [facility];  Service: Endoscopy;  Laterality: N/A;    REVIEW OF SYSTEMS:   Constitutional: Denies fevers, chills or abnormal weight loss Eyes: Denies blurriness of vision Ears, nose, mouth, throat, and face: Denies mucositis or sore throat Respiratory: Denies cough, dyspnea or wheezes Cardiovascular: Denies palpitation, chest discomfort or lower extremity swelling Gastrointestinal:  Denies nausea, heartburn or change in bowel habits Skin: Denies abnormal skin rashes Lymphatics: Denies new lymphadenopathy or easy bruising Neurological:Denies numbness, tingling or new weaknesses Behavioral/Psych: Mood is stable, no new changes  All other systems were reviewed with the patient and are negative.  PHYSICAL EXAMINATION: ECOG PERFORMANCE STATUS: 1 - Symptomatic but completely ambulatory  Blood pressure 145/71, pulse 96, temperature 97.6 F (36.4 C), temperature source Oral, resp. rate 19, height 5\' 3"  (1.6 m), weight 218 lb 6.4 oz (99.066 kg), SpO2 96.00%. (weight was 229 lb on 10/28/2013)  GENERAL:alert, no distress and comfortable; moderately obese SKIN: skin color, texture, turgor are normal, no rashes or significant lesions EYES: normal, Conjunctiva are pink and non-injected, sclera clear OROPHARYNX:no exudate, no erythema and lips, buccal mucosa, and tongue  normal  NECK: supple, thyroid normal size, non-tender, without nodularity LYMPH:  no palpable lymphadenopathy in the cervical, axillary or supraclavicular LUNGS: clear to auscultation and percussion with normal breathing effort HEART:  RR with pacemaker in the left infraclavicular area. Chronic lower extremity  brawny edema with stasis changes.  ABDOMEN:abdomen soft, non-tender and normal bowel sounds Musculoskeletal:no cyanosis of digits and no clubbing  NEURO: alert & oriented x 3 with fluent speech, no focal motor/sensory deficits  Labs:  Lab Results  Component Value Date   WBC 9.6 12/26/2013   HGB 8.9* 12/26/2013   HCT 27.4* 12/26/2013   MCV 88.3 12/26/2013   PLT 254 12/26/2013   NEUTROABS 6.9* 12/26/2013      Chemistry      Component Value Date/Time   NA 134* 12/26/2013 1321   NA 137 01/20/2013 0443   K 4.1 12/26/2013 1321   K 3.5 01/20/2013 0443   CL 100 01/20/2013 0443   CL 105 08/30/2012 1108   CO2 25 12/26/2013 1321   CO2 26 01/20/2013 0443   BUN 51.5* 12/26/2013 1321   BUN 24* 01/20/2013 0443   CREATININE 2.4* 12/26/2013 1321   CREATININE 1.63* 01/20/2013 0443   CREATININE 1.68* 05/16/2011 1352      Component Value Date/Time   CALCIUM 8.3* 12/26/2013 1321   CALCIUM 8.9 01/20/2013 0443   ALKPHOS 97 12/26/2013 1321   ALKPHOS 145* 08/07/2012 0445   AST 26 12/26/2013 1321   AST 29 08/07/2012 0445   ALT 34 12/26/2013 1321   ALT 17 08/07/2012 0445   BILITOT 0.37 12/26/2013 1321   BILITOT 0.4 08/07/2012 0445     CBC:  Recent Labs Lab 12/26/13 1321  WBC 9.6  NEUTROABS 6.9*  HGB 8.9*  HCT 27.4*  MCV 88.3  PLT 254   Studies:  No results found.   RADIOGRAPHIC STUDIES: No results found.  ASSESSMENT: Kayton Ripp Alexander 80 y.o. female with a history of MGUS (monoclonal gammopathy of unknown significance)  Acute on chronic combined systolic and diastolic congestive heart failure, NYHA class 4  Chronic anemia - Plan: CBC with Differential, CBC with Differential, Comprehensive metabolic panel (Cmet) - [facility], Lactate dehydrogenase (LDH) - [facility]   PLAN:  1. Anemia of chronic renal failure.  --She feels tired in general but denies chest discomfort.  Her hemoglobin is 8.9 today down from 9.4 last visit.  She received her aranesp 200 mcg subcutaneous shot on 04/27 and  received 300 mcg on last  visit.  We will start giving her aranesp 200 mcg every two weeks starting today due to increasing blood transfusions requirements. She will continue to have CBCs every 2 weeks and have aranesp as indicated.  Her history is also notable for angiodyplasia and diverticulosis and hemorrhoids resulting in heme positive stool.    2. Afib/CHF/Pacer --Followed by cardiology.  Her medtronic biventricular pacemaker was working normally on 12/09 and recently checked 05/18.  She was started on amiodarone 100 mg daily and will continue at that dose.  3. CKDz. --Her creatinine is 2.4 up  2.1 up from 1.8 last visit.  Creatinine clearance is 21.3 mL/min.  I suspect that she is being over diuresis as evident by her 12 lb weight lost.  I will forward to her cardiology team for further evaluation.   4. MGUS. --Her IgG levels remains stable. She denies any symptoms including bone pain.  5. Follow-up.   --Patient will return to clinic in 2 weeks for CBC, CMP and iron studies.  She had a colonoscopy with Dr. Sandy Salaam. [facility] on 01/19/2013.   All questions were answered. The patient knows to call the clinic with any problems, questions or concerns. We can certainly see the patient much sooner if necessary.  I spent 15 minutes counseling the patient face to face. The total time spent in the appointment was 25 minutes.    CHISM, DAVID, MD 12/26/2013 3:48 PM

## 2013-12-27 ENCOUNTER — Ambulatory Visit (INDEPENDENT_AMBULATORY_CARE_PROVIDER_SITE_OTHER): Payer: Medicare Other | Admitting: Cardiology

## 2013-12-27 ENCOUNTER — Encounter: Payer: Self-pay | Admitting: Cardiology

## 2013-12-27 ENCOUNTER — Ambulatory Visit (INDEPENDENT_AMBULATORY_CARE_PROVIDER_SITE_OTHER): Payer: Medicare Other

## 2013-12-27 VITALS — BP 148/52 | HR 70 | Ht 63.0 in | Wt 208.0 lb

## 2013-12-27 DIAGNOSIS — I480 Paroxysmal atrial fibrillation: Secondary | ICD-10-CM

## 2013-12-27 DIAGNOSIS — N183 Chronic kidney disease, stage 3 unspecified: Secondary | ICD-10-CM

## 2013-12-27 DIAGNOSIS — I5022 Chronic systolic (congestive) heart failure: Secondary | ICD-10-CM

## 2013-12-27 DIAGNOSIS — I509 Heart failure, unspecified: Secondary | ICD-10-CM

## 2013-12-27 DIAGNOSIS — Z5181 Encounter for therapeutic drug level monitoring: Secondary | ICD-10-CM

## 2013-12-27 DIAGNOSIS — I4891 Unspecified atrial fibrillation: Secondary | ICD-10-CM

## 2013-12-27 DIAGNOSIS — Z7901 Long term (current) use of anticoagulants: Secondary | ICD-10-CM

## 2013-12-27 DIAGNOSIS — I25810 Atherosclerosis of coronary artery bypass graft(s) without angina pectoris: Secondary | ICD-10-CM

## 2013-12-27 LAB — POCT INR: INR: 1.8

## 2013-12-27 NOTE — Patient Instructions (Signed)
Reduce Lasix to 40 mg twice a day.  I will schedule you a follow up in one month.

## 2013-12-27 NOTE — Progress Notes (Signed)
[name] Date of Birth: 12-Jan-1933 Medical Record #[id number]  History of Present Illness: [name] is seen back today for a follow up visit.  She has had atrial fib with RVR and CHF. EF was down to 30%. Afib could not be regulated. Not a candidate for antiarrhythmics due to her multiple comorbidities/intolerances. A BiV pacemaker was implanted followed by AV nodal ablation in December 2013.  Echo was repeated in February 2014 and had improved with an EF up to 45 to 50%. Most recent Echo in 12/14 showed an EF of 40-45%. When seen in April she had significant volume overload. We increased her lasix to 60 mg bid. Since then she has lost 24 lbs. Her edema is markedly better. She still complains of feeling tired all the time. She has some dyspnea. She complains of having a sinking feeling with weakness. She was seen recently by Dr. Juliann Mule and is receiving Aranesp for anemia. She was noted to have increased creatinine.    Current Outpatient Prescriptions on File Prior to Visit  Medication Sig Dispense Refill  . amiodarone (PACERONE) 200 MG tablet Take 0.5 tablets (100 mg total) by mouth daily.  45 tablet  3  . amitriptyline (ELAVIL) 25 MG tablet Take 75 mg by mouth at bedtime.        Marland Kitchen atorvastatin (LIPITOR) 40 MG tablet Take 40 mg by mouth every evening.       . diltiazem (CARDIZEM CD) 240 MG 24 hr capsule Take 1 capsule (240 mg total) by mouth daily.  30 capsule  11  . donepezil (ARICEPT) 5 MG tablet Take 5 mg by mouth at bedtime.      Marland Kitchen esomeprazole (NEXIUM) 40 MG capsule Take 40 mg by mouth daily before breakfast.        . ezetimibe (ZETIA) 10 MG tablet Take 10 mg by mouth every evening.       . furosemide (LASIX) 40 MG tablet Take 1.5 tablets (60 mg total) by mouth 2 (two) times daily.  90 tablet  6  . insulin glargine (LANTUS) 100 UNIT/ML injection Inject 45 Units into the skin 2 (two) times daily.       . insulin lispro (HUMALOG) 100 UNIT/ML injection Inject 0-20 Units into the skin 2  (two) times daily as needed for high blood sugar. Home sliding scale If blood sugar is <200 take 15 units If blood sugar is >200 take 20 units If blood sugar is less than 70, don't take any      . levothyroxine (SYNTHROID, LEVOTHROID) 100 MCG tablet Take 100 mcg by mouth every morning.       . metoprolol succinate (TOPROL-XL) 50 MG 24 hr tablet Take 1 tablet (50 mg total) by mouth every morning.  30 tablet  6  . nitroGLYCERIN (NITROSTAT) 0.4 MG SL tablet Place 0.4 mg under the tongue every 5 (five) minutes as needed. For chest pain      . potassium chloride SA (K-DUR,KLOR-CON) 20 MEQ tablet take 1 tablet by mouth every morning  30 tablet  3  . warfarin (COUMADIN) 5 MG tablet 2 tablets everyday except 1.5 tablets on Sundays, Tuesdays and Thursdays or as directed by coumadin clinic  70 tablet  3  . [DISCONTINUED] enoxaparin (LOVENOX) 100 MG/ML SOLN Inject into the skin every 12 (twelve) hours.        . [DISCONTINUED] simvastatin (ZOCOR) 80 MG tablet Take 80 mg by mouth at bedtime.         No  current facility-administered medications on file prior to visit.    Allergies  Allergen Reactions  . Betadine [Povidone Iodine] Itching  . Capoten [Captopril] Other (See Comments)    unknown  . Codone [Hydrocodone] Nausea And Vomiting  . Methadone Nausea And Vomiting  . Penicillins Swelling  . Propoxyphene And Methadone     Intolerance to darvocet    Past Medical History  Diagnosis Date  . Coronary artery disease     a. Severe two vessel; s/p CABG (LIMA-LAD, SVG-diag, SVG-PDA);  b. 05/2012 NSTEMI in setting of rapid aflutter;  c. 06/2012 Lexiscan cardiolite: small, mild reversible defect in lateral wall->mild ischemia, low-risk->med Rx.  Marland Kitchen Hypertension   . Type II diabetes mellitus   . Hyperlipidemia   . Diabetic neuropathy   . CVA (cerebral vascular accident) 2004  . Hypothyroidism   . Chronic anemia   . GERD (gastroesophageal reflux disease)   . Arthritis   . Memory loss, short term   .  COPD (chronic obstructive pulmonary disease)   . Blood transfusion without reported diagnosis   . Chronic combined systolic and diastolic CHF (congestive heart failure)     a. 07/2012 Echo: EF 30%, Gr II DD, Mild AS/MR, Mod-Sev TR, mild bi-atrial and RV dil, PASP 72mmHg.  . Moderate mitral regurgitation     a. mod by TEE 2012, mild by echo 2013, 07/2012  . Severe tricuspid regurgitation     a. Severe by TEE 2012, mod by echo 2013, mod-sev by echo 07/2012  . Chronic vulvitis 07/1993  . Idiopathic anemia 2006  . Atrial fibrillation     a. Failed amiodarone (prolonged QT), not a candidate for class IC meds due to her CAD. No Multaq due to CHF hx;  b. 07/2012 s/p SJM Anthem RF Bi-V PPM, ser # [id number] and AV node RFCA;  c. chronic coumadin.  . Atrial flutter   . Pacemaker   . GI bleed     a. 10/2011 - Dr Benson Norway did scope which showed hiatal hernia, colon revealed diverticula (diverticular bleed), internal/ext hemorrhoids.    Past Surgical History  Procedure Laterality Date  . Coronary artery bypass graft  06/2009    X3, LIMA to LAD, SVG to diagonal, SVG to the posterior descending artery.  . Tonsillectomy    . Knee arthroscopy  08/2001  . Transthoracic echocardiogram  11/12/2010     Ejection fraction felt to be around 50%.  Wall thickness was increased in a pattern of mild LVH.  Moderately dilated left atrium.  Mildly dilated right atrium. Right ventricle was mildly dilated with mildly reduced systolic function  . Cardiac catheterization  06/09/2009    inferior wall hypokinesia with ejection fraction of 55%.  . Abdominal hysterectomy    . Joint replacement    . Tee without cardioversion  05/23/2011    Procedure: TRANSESOPHAGEAL ECHOCARDIOGRAM (TEE);  Surgeon: Lelon Perla, MD;  Location: [place];  Service: Cardiovascular;  Laterality: N/A;  . Incision and drainage abscess  05/07/2012    Procedure: INCISION AND DRAINAGE ABSCESS;  Surgeon: Adin Hector, MD;  Location: [place];  Service:  General;  Laterality: N/A;  Groin wound  . Total abdominal hysterectomy  1979  . Breast biopsy  Left  . Bilateral salpingectomy  10/2000  . Breast biopsy  09/18/2009    fibrocystic change  . Coronary artery bypass graft  06/2009  . Pacemaker insertion  08/2012  . Colonoscopy N/A 01/19/2013    Procedure: COLONOSCOPY;  Surgeon: Inda Castle,  MD;  Location: [facility];  Service: Endoscopy;  Laterality: N/A;  . Esophagogastroduodenoscopy N/A 01/19/2013    Procedure: ESOPHAGOGASTRODUODENOSCOPY (EGD);  Surgeon: Inda Castle, MD;  Location: [facility];  Service: Endoscopy;  Laterality: N/A;    History  Smoking status  . Former Smoker  . Types: Cigarettes  Smokeless tobacco  . Never Used    History  Alcohol Use No    Family History  Problem Relation Age of Onset  . Colon cancer Mother   . Stroke Mother   . Hypertension Mother   . Cancer Mother     breast  . Cancer Father     colon  . Heart attack Father     x2  . Renal Disease Sister     dialysis-twin sister  . Renal Disease Sister     renal bypass    Review of Systems: The review of systems is per the HPI.   All other systems were reviewed and are negative.  Physical Exam: BP 148/52  Pulse 70  Ht 5\' 3"  (1.6 m)  Wt 208 lb (94.348 kg)  BMI 36.85 kg/m2 Patient is  pleasant and in no acute distress. She has memory loss. Skin is warm and dry. Color is normal.  HEENT is unremarkable. Normocephalic/atraumatic. PERRL. Sclera are nonicteric. Neck is supple. No masses. No JVD. Lungs are clear. Cardiac exam shows a regular rate and rhythm. There is a grade 2/6 systolic murmur heard at the left sternal border.. Abdomen is soft. Extremities are without edema but with chronic skin thickening. Gait and ROM are intact. No gross neurologic deficits noted.  LABORATORY DATA:  Lab Results  Component Value Date   WBC 9.6 12/26/2013   HGB 8.9* 12/26/2013   HCT 27.4* 12/26/2013   PLT 254 12/26/2013   GLUCOSE 342* 12/26/2013   CHOL  160 05/26/2012   TRIG 80 05/26/2012   HDL 29* 05/26/2012   LDLCALC 115* 05/26/2012   ALT 34 12/26/2013   AST 26 12/26/2013   NA 134* 12/26/2013   K 4.1 12/26/2013   CL 100 01/20/2013   CREATININE 2.4* 12/26/2013   BUN 51.5* 12/26/2013   CO2 25 12/26/2013   TSH 1.257 01/18/2013   INR 1.8 12/27/2013   HGBA1C 8.0* 01/16/2013    Assessment / Plan:  1.  Chronic systolic and diastolic heart failure -markedly improved with increased diuretics. I think she is dry now. Will reduce lasix to 40 mg bid.  Continue sodium restriction.  2. S/p BiV pacemaker implant with AV node ablation. 99% biV paced.  3. Atrial fib -  now s/p AV node ablation. Now on amiodarone to see if this will reduce her time in AFib. Will follow up with Dr. Lovena Le.   4. Chronic coumadin therapy. INR was 1.8 today. Dose adjusted.  5. Memory loss.  6. CAD status post CABG. No anginal symptoms.  7. Anemia- chronic. Followed by hematology. On Aranesp.

## 2013-12-29 ENCOUNTER — Emergency Department (HOSPITAL_COMMUNITY)
Admission: EM | Admit: 2013-12-29 | Discharge: 2013-12-29 | Disposition: A | Discharge status: Home / Self Care | Payer: Medicare Other | Attending: Emergency Medicine | Admitting: Emergency Medicine

## 2013-12-29 ENCOUNTER — Encounter (HOSPITAL_COMMUNITY): Payer: Self-pay | Admitting: Emergency Medicine

## 2013-12-29 DIAGNOSIS — E1142 Type 2 diabetes mellitus with diabetic polyneuropathy: Secondary | ICD-10-CM | POA: Insufficient documentation

## 2013-12-29 DIAGNOSIS — Z880 Allergy status to penicillin: Secondary | ICD-10-CM | POA: Insufficient documentation

## 2013-12-29 DIAGNOSIS — E039 Hypothyroidism, unspecified: Secondary | ICD-10-CM | POA: Insufficient documentation

## 2013-12-29 DIAGNOSIS — Z794 Long term (current) use of insulin: Secondary | ICD-10-CM | POA: Insufficient documentation

## 2013-12-29 DIAGNOSIS — E1149 Type 2 diabetes mellitus with other diabetic neurological complication: Secondary | ICD-10-CM | POA: Insufficient documentation

## 2013-12-29 DIAGNOSIS — M79642 Pain in left hand: Secondary | ICD-10-CM

## 2013-12-29 DIAGNOSIS — I4892 Unspecified atrial flutter: Secondary | ICD-10-CM | POA: Insufficient documentation

## 2013-12-29 DIAGNOSIS — Z8742 Personal history of other diseases of the female genital tract: Secondary | ICD-10-CM | POA: Insufficient documentation

## 2013-12-29 DIAGNOSIS — Z8673 Personal history of transient ischemic attack (TIA), and cerebral infarction without residual deficits: Secondary | ICD-10-CM | POA: Insufficient documentation

## 2013-12-29 DIAGNOSIS — Z862 Personal history of diseases of the blood and blood-forming organs and certain disorders involving the immune mechanism: Secondary | ICD-10-CM | POA: Insufficient documentation

## 2013-12-29 DIAGNOSIS — M79641 Pain in right hand: Secondary | ICD-10-CM

## 2013-12-29 DIAGNOSIS — M129 Arthropathy, unspecified: Secondary | ICD-10-CM | POA: Insufficient documentation

## 2013-12-29 DIAGNOSIS — K219 Gastro-esophageal reflux disease without esophagitis: Secondary | ICD-10-CM | POA: Insufficient documentation

## 2013-12-29 DIAGNOSIS — Z87891 Personal history of nicotine dependence: Secondary | ICD-10-CM | POA: Insufficient documentation

## 2013-12-29 DIAGNOSIS — J4489 Other specified chronic obstructive pulmonary disease: Secondary | ICD-10-CM | POA: Insufficient documentation

## 2013-12-29 DIAGNOSIS — Z950 Presence of cardiac pacemaker: Secondary | ICD-10-CM | POA: Insufficient documentation

## 2013-12-29 DIAGNOSIS — I10 Essential (primary) hypertension: Secondary | ICD-10-CM | POA: Insufficient documentation

## 2013-12-29 DIAGNOSIS — I2510 Atherosclerotic heart disease of native coronary artery without angina pectoris: Secondary | ICD-10-CM | POA: Insufficient documentation

## 2013-12-29 DIAGNOSIS — I4891 Unspecified atrial fibrillation: Secondary | ICD-10-CM | POA: Insufficient documentation

## 2013-12-29 DIAGNOSIS — J449 Chronic obstructive pulmonary disease, unspecified: Secondary | ICD-10-CM | POA: Insufficient documentation

## 2013-12-29 DIAGNOSIS — Z7901 Long term (current) use of anticoagulants: Secondary | ICD-10-CM | POA: Insufficient documentation

## 2013-12-29 DIAGNOSIS — I059 Rheumatic mitral valve disease, unspecified: Secondary | ICD-10-CM | POA: Insufficient documentation

## 2013-12-29 DIAGNOSIS — E785 Hyperlipidemia, unspecified: Secondary | ICD-10-CM | POA: Insufficient documentation

## 2013-12-29 DIAGNOSIS — I5042 Chronic combined systolic (congestive) and diastolic (congestive) heart failure: Secondary | ICD-10-CM | POA: Insufficient documentation

## 2013-12-29 DIAGNOSIS — Z79899 Other long term (current) drug therapy: Secondary | ICD-10-CM | POA: Insufficient documentation

## 2013-12-29 DIAGNOSIS — M79609 Pain in unspecified limb: Secondary | ICD-10-CM | POA: Insufficient documentation

## 2013-12-29 DIAGNOSIS — Z951 Presence of aortocoronary bypass graft: Secondary | ICD-10-CM | POA: Insufficient documentation

## 2013-12-29 LAB — I-STAT CHEM 8, ED
BUN: 39 mg/dL — ABNORMAL HIGH (ref 6–23)
CALCIUM ION: 1.08 mmol/L — AB (ref 1.13–1.30)
Chloride: 97 mEq/L (ref 96–112)
Creatinine, Ser: 2 mg/dL — ABNORMAL HIGH (ref 0.50–1.10)
GLUCOSE: 116 mg/dL — AB (ref 70–99)
HCT: 33 % — ABNORMAL LOW (ref 36.0–46.0)
HEMOGLOBIN: 11.2 g/dL — AB (ref 12.0–15.0)
Potassium: 4 mEq/L (ref 3.7–5.3)
Sodium: 138 mEq/L (ref 137–147)
TCO2: 26 mmol/L (ref 0–100)

## 2013-12-29 NOTE — ED Notes (Signed)
Pt states she has had this cramping in her hands and feet on and off "for a while."

## 2013-12-29 NOTE — ED Provider Notes (Signed)
CSN: [id number]     Arrival date & time 12/29/13  1104 History   First MD Initiated Contact with Patient 12/29/13 1132     Chief Complaint  Patient presents with  . Hand Pain    bilateral     (Consider location/radiation/quality/duration/timing/severity/associated sxs/prior Treatment) HPI  [name] is a 80 y.o. female with multiple medical comorbidities complaining of bilateral hand cramping onset 2 days ago. Patient denies numbness, weakness. She states that she's had pain in her hands in the past but never this bad. She has not been taking any pain medications at home. Daughter states pain was severe this morning and her mother was crying, daughter gave her mother orange juice for the potassium. Patient states that pain is now 0 of 10. States that she recently had pedal edema and her Lasix was increased. She is also taking potassium supplementation. Patient denies chest pain, shortness of breath, headache, dysarthria, ataxia, abdominal pain, she is eating and drinking normally with normal bowel movements and urination.   Past Medical History  Diagnosis Date  . Coronary artery disease     a. Severe two vessel; s/p CABG (LIMA-LAD, SVG-diag, SVG-PDA);  b. 05/2012 NSTEMI in setting of rapid aflutter;  c. 06/2012 Lexiscan cardiolite: small, mild reversible defect in lateral wall->mild ischemia, low-risk->med Rx.  Marland Kitchen Hypertension   . Type II diabetes mellitus   . Hyperlipidemia   . Diabetic neuropathy   . CVA (cerebral vascular accident) 2004  . Hypothyroidism   . Chronic anemia   . GERD (gastroesophageal reflux disease)   . Arthritis   . Memory loss, short term   . COPD (chronic obstructive pulmonary disease)   . Blood transfusion without reported diagnosis   . Chronic combined systolic and diastolic CHF (congestive heart failure)     a. 07/2012 Echo: EF 30%, Gr II DD, Mild AS/MR, Mod-Sev TR, mild bi-atrial and RV dil, PASP 37mmHg.  . Moderate mitral regurgitation     a. mod by  TEE 2012, mild by echo 2013, 07/2012  . Severe tricuspid regurgitation     a. Severe by TEE 2012, mod by echo 2013, mod-sev by echo 07/2012  . Chronic vulvitis 07/1993  . Idiopathic anemia 2006  . Atrial fibrillation     a. Failed amiodarone (prolonged QT), not a candidate for class IC meds due to her CAD. No Multaq due to CHF hx;  b. 07/2012 s/p SJM Anthem RF Bi-V PPM, ser # [id number] and AV node RFCA;  c. chronic coumadin.  . Atrial flutter   . Pacemaker   . GI bleed     a. 10/2011 - Dr Benson Norway did scope which showed hiatal hernia, colon revealed diverticula (diverticular bleed), internal/ext hemorrhoids.   Past Surgical History  Procedure Laterality Date  . Coronary artery bypass graft  06/2009    X3, LIMA to LAD, SVG to diagonal, SVG to the posterior descending artery.  . Tonsillectomy    . Knee arthroscopy  08/2001  . Transthoracic echocardiogram  11/12/2010     Ejection fraction felt to be around 50%.  Wall thickness was increased in a pattern of mild LVH.  Moderately dilated left atrium.  Mildly dilated right atrium. Right ventricle was mildly dilated with mildly reduced systolic function  . Cardiac catheterization  06/09/2009    inferior wall hypokinesia with ejection fraction of 55%.  . Abdominal hysterectomy    . Joint replacement    . Tee without cardioversion  05/23/2011    Procedure: TRANSESOPHAGEAL ECHOCARDIOGRAM (  TEE);  Surgeon: Lelon Perla, MD;  Location: [facility];  Service: Cardiovascular;  Laterality: N/A;  . Incision and drainage abscess  05/07/2012    Procedure: INCISION AND DRAINAGE ABSCESS;  Surgeon: Adin Hector, MD;  Location: [place];  Service: General;  Laterality: N/A;  Groin wound  . Total abdominal hysterectomy  1979  . Breast biopsy  Left  . Bilateral salpingectomy  10/2000  . Breast biopsy  09/18/2009    fibrocystic change  . Coronary artery bypass graft  06/2009  . Pacemaker insertion  08/2012  . Colonoscopy N/A 01/19/2013    Procedure: COLONOSCOPY;   Surgeon: Inda Castle, MD;  Location: [facility];  Service: Endoscopy;  Laterality: N/A;  . Esophagogastroduodenoscopy N/A 01/19/2013    Procedure: ESOPHAGOGASTRODUODENOSCOPY (EGD);  Surgeon: Inda Castle, MD;  Location: [facility];  Service: Endoscopy;  Laterality: N/A;   Family History  Problem Relation Age of Onset  . Colon cancer Mother   . Stroke Mother   . Hypertension Mother   . Cancer Mother     breast  . Cancer Father     colon  . Heart attack Father     x2  . Renal Disease Sister     dialysis-twin sister  . Renal Disease Sister     renal bypass   History  Substance Use Topics  . Smoking status: Former Smoker    Types: Cigarettes  . Smokeless tobacco: Never Used  . Alcohol Use: No   OB History   Grav Para Term Preterm Abortions TAB SAB Ect Mult Living   2 2 2       2      Review of Systems  10 systems reviewed and found to be negative, except as noted in the HPI.   Allergies  Betadine; Capoten; Codone; Methadone; Penicillins; and Propoxyphene and methadone  Home Medications   Prior to Admission medications   Medication Sig Start Date End Date Taking? Authorizing Provider  amiodarone (PACERONE) 200 MG tablet Take 0.5 tablets (100 mg total) by mouth daily. 06/11/13   Evans Lance, MD  amitriptyline (ELAVIL) 25 MG tablet Take 75 mg by mouth at bedtime.      Historical Provider, MD  atorvastatin (LIPITOR) 40 MG tablet Take 40 mg by mouth every evening.     Historical Provider, MD  diltiazem (CARDIZEM CD) 240 MG 24 hr capsule Take 1 capsule (240 mg total) by mouth daily. 02/08/13   Peter M Martinique, MD  donepezil (ARICEPT) 5 MG tablet Take 5 mg by mouth at bedtime.    Historical Provider, MD  esomeprazole (NEXIUM) 40 MG capsule Take 40 mg by mouth daily before breakfast.      Historical Provider, MD  ezetimibe (ZETIA) 10 MG tablet Take 10 mg by mouth every evening.     Historical Provider, MD  furosemide (LASIX) 40 MG tablet Take 1.5 tablets (60 mg total)  by mouth 2 (two) times daily. 10/23/13   Peter M Martinique, MD  insulin glargine (LANTUS) 100 UNIT/ML injection Inject 45 Units into the skin 2 (two) times daily.     Historical Provider, MD  insulin lispro (HUMALOG) 100 UNIT/ML injection Inject 0-20 Units into the skin 2 (two) times daily as needed for high blood sugar. Home sliding scale If blood sugar is <200 take 15 units If blood sugar is >200 take 20 units If blood sugar is less than 70, don't take any    Historical Provider, MD  levothyroxine (SYNTHROID, LEVOTHROID) 100  MCG tablet Take 100 mcg by mouth every morning.     Historical Provider, MD  metoprolol succinate (TOPROL-XL) 50 MG 24 hr tablet Take 1 tablet (50 mg total) by mouth every morning. 10/02/13   Peter M Martinique, MD  nitroGLYCERIN (NITROSTAT) 0.4 MG SL tablet Place 0.4 mg under the tongue every 5 (five) minutes as needed. For chest pain    Historical Provider, MD  potassium chloride SA (K-DUR,KLOR-CON) 20 MEQ tablet take 1 tablet by mouth every morning    Peter M Martinique, MD  warfarin (COUMADIN) 5 MG tablet 2 tablets everyday except 1.5 tablets on Sundays, Tuesdays and Thursdays or as directed by coumadin clinic 11/18/13   Peter M Martinique, MD   BP 141/54  Pulse 70  Temp(Src) 99.1 F (37.3 C) (Oral)  Resp 18  SpO2 97% Physical Exam  Nursing note and vitals reviewed. Constitutional: She is oriented to person, place, and time. She appears well-developed and well-nourished. No distress.  HENT:  Head: Normocephalic and atraumatic.  Mouth/Throat: Oropharynx is clear and moist.  Eyes: Conjunctivae and EOM are normal. Pupils are equal, round, and reactive to light.  Neck: Normal range of motion.  Cardiovascular: Normal rate, regular rhythm and intact distal pulses.   Pulmonary/Chest: Effort normal and breath sounds normal. No stridor. No respiratory distress. She has no wheezes. She has no rales. She exhibits no tenderness.  Abdominal: Soft. Bowel sounds are normal. She exhibits no  distension and no mass. There is no tenderness. There is no rebound and no guarding.  Musculoskeletal: Normal range of motion.  No swelling to bilateral hands, and no point tenderness to palpation. Excellent range of motion and strength is 5 out of 5 bilaterally. Pulses 2+ bilaterally.  Neurological: She is alert and oriented to person, place, and time.  Skin: Skin is warm.  Psychiatric: She has a normal mood and affect.    ED Course  Procedures (including critical care time) Labs Review Labs Reviewed  I-STAT CHEM 8, ED - Abnormal; Notable for the following:    BUN 39 (*)    Creatinine, Ser 2.00 (*)    Glucose, Bld 116 (*)    Calcium, Ion 1.08 (*)    Hemoglobin 11.2 (*)    HCT 33.0 (*)    All other components within normal limits    Imaging Review No results found.   EKG Interpretation None      MDM   Final diagnoses:  Bilateral hand pain    Filed Vitals:   12/29/13 1117  BP: 141/54  Pulse: 70  Temp: 99.1 F (37.3 C)  TempSrc: Oral  Resp: 18  SpO2: 97%    [name] is a 80 y.o. female presenting with bilateral hand pain and cramping worsening over the course of 2 days. Pain is now resolved. Neuro exam is nonfocal. I-STAT with no electrolyte abnormality. Advised patient to take Tylenol for pain control he needed. Ice patient's daughter that this may cause abnormalities with Coumadin to let her primary care physician noted she is taking Tylenol. The patient and daughter verbalize their understanding.  Evaluation does not show pathology that would require ongoing emergent intervention or inpatient treatment. Pt is hemodynamically stable and mentating appropriately. Discussed findings and plan with patient/guardian, who agrees with care plan. All questions answered. Return precautions discussed and outpatient follow up given.       Monico Blitz, PA-C 12/29/13 1237

## 2013-12-29 NOTE — ED Notes (Signed)
Patient is alert and oriented x3.  She is complaining of cramping in both hands that started Yesterday.  Patient family states that the patient has been having a cramping issue for about  A year, but today is the worst that it has been.  Currently she rates her pain 8 of 10.

## 2013-12-29 NOTE — Discharge Instructions (Signed)
Take acetaminophen (Tylenol) up to 650 mg (this is normally 2 over-the-counter pills) up every 6 hours as needed. Do not drink alcohol. Make sure your other medications do not contain acetaminophen (Read the labels!)  Please follow with your primary care doctor in the next 2 days for a check-up. They must obtain records for further management.   Do not hesitate to return to the Emergency Department for any new, worsening or concerning symptoms.    Musculoskeletal Pain Musculoskeletal pain is muscle and boney aches and pains. These pains can occur in any part of the body. Your caregiver may treat you without knowing the cause of the pain. They may treat you if blood or urine tests, X-rays, and other tests were normal.  CAUSES There is often not a definite cause or reason for these pains. These pains may be caused by a type of germ (virus). The discomfort may also come from overuse. Overuse includes working out too hard when your body is not fit. Boney aches also come from weather changes. Bone is sensitive to atmospheric pressure changes. HOME CARE INSTRUCTIONS   Ask when your test results will be ready. Make sure you get your test results.  Only take over-the-counter or prescription medicines for pain, discomfort, or fever as directed by your caregiver. If you were given medications for your condition, do not drive, operate machinery or power tools, or sign legal documents for 24 hours. Do not drink alcohol. Do not take sleeping pills or other medications that may interfere with treatment.  Continue all activities unless the activities cause more pain. When the pain lessens, slowly resume normal activities. Gradually increase the intensity and duration of the activities or exercise.  During periods of severe pain, bed rest may be helpful. Lay or sit in any position that is comfortable.  Putting ice on the injured area.  Put ice in a bag.  Place a towel between your skin and the bag.  Leave  the ice on for 15 to 20 minutes, 3 to 4 times a day.  Follow up with your caregiver for continued problems and no reason can be found for the pain. If the pain becomes worse or does not go away, it may be necessary to repeat tests or do additional testing. Your caregiver may need to look further for a possible cause. SEEK IMMEDIATE MEDICAL CARE IF:  You have pain that is getting worse and is not relieved by medications.  You develop chest pain that is associated with shortness or breath, sweating, feeling sick to your stomach (nauseous), or throw up (vomit).  Your pain becomes localized to the abdomen.  You develop any new symptoms that seem different or that concern you. MAKE SURE YOU:   Understand these instructions.  Will watch your condition.  Will get help right away if you are not doing well or get worse. Document Released: 06/20/2005 Document Revised: 09/12/2011 Document Reviewed: 02/22/2013 [facility] Patient Information 2015 [place], [place]. This information is not intended to replace advice given to you by your health care provider. Make sure you discuss any questions you have with your health care provider.

## 2013-12-31 NOTE — ED Provider Notes (Signed)
Medical screening examination/treatment/procedure(s) were performed by non-physician practitioner and as supervising physician I was immediately available for consultation/collaboration.   EKG Interpretation None        Fredia Sorrow, MD 12/31/13 [phone]

## 2014-01-09 ENCOUNTER — Ambulatory Visit (HOSPITAL_BASED_OUTPATIENT_CLINIC_OR_DEPARTMENT_OTHER): Payer: Medicare Other

## 2014-01-09 ENCOUNTER — Other Ambulatory Visit (HOSPITAL_BASED_OUTPATIENT_CLINIC_OR_DEPARTMENT_OTHER): Payer: Medicare Other

## 2014-01-09 VITALS — BP 147/63 | HR 95 | Temp 98.2°F

## 2014-01-09 DIAGNOSIS — N189 Chronic kidney disease, unspecified: Secondary | ICD-10-CM

## 2014-01-09 DIAGNOSIS — N183 Chronic kidney disease, stage 3 (moderate): Secondary | ICD-10-CM

## 2014-01-09 DIAGNOSIS — N039 Chronic nephritic syndrome with unspecified morphologic changes: Secondary | ICD-10-CM

## 2014-01-09 DIAGNOSIS — D649 Anemia, unspecified: Secondary | ICD-10-CM

## 2014-01-09 DIAGNOSIS — D631 Anemia in chronic kidney disease: Secondary | ICD-10-CM

## 2014-01-09 LAB — CBC WITH DIFFERENTIAL/PLATELET
BASO%: 0.5 % (ref 0.0–2.0)
Basophils Absolute: 0 10*3/uL (ref 0.0–0.1)
EOS%: 2.2 % (ref 0.0–7.0)
Eosinophils Absolute: 0.1 10*3/uL (ref 0.0–0.5)
HCT: 26.6 % — ABNORMAL LOW (ref 34.8–46.6)
HGB: 8.3 g/dL — ABNORMAL LOW (ref 11.6–15.9)
LYMPH%: 23.6 % (ref 14.0–49.7)
MCH: 27.4 pg (ref 25.1–34.0)
MCHC: 31.2 g/dL — ABNORMAL LOW (ref 31.5–36.0)
MCV: 87.8 fL (ref 79.5–101.0)
MONO#: 1.1 10*3/uL — AB (ref 0.1–0.9)
MONO%: 16.7 % — ABNORMAL HIGH (ref 0.0–14.0)
NEUT#: 3.7 10*3/uL (ref 1.5–6.5)
NEUT%: 57 % (ref 38.4–76.8)
NRBC: 0 % (ref 0–0)
Platelets: 147 10*3/uL (ref 145–400)
RBC: 3.03 10*6/uL — AB (ref 3.70–5.45)
RDW: 17.4 % — AB (ref 11.2–14.5)
WBC: 6.4 10*3/uL (ref 3.9–10.3)
lymph#: 1.5 10*3/uL (ref 0.9–3.3)

## 2014-01-09 MED ORDER — DARBEPOETIN ALFA-POLYSORBATE 200 MCG/0.4ML IJ SOLN
200.0000 ug | Freq: Once | INTRAMUSCULAR | Status: AC
  Administered 2014-01-09: 200 ug via SUBCUTANEOUS
  Filled 2014-01-09: qty 0.4

## 2014-01-13 ENCOUNTER — Telehealth: Payer: Self-pay | Admitting: Cardiology

## 2014-01-13 NOTE — Telephone Encounter (Signed)
Returned call to patient's husband he stated wife has been having chest pain and sob for the past couple of days.Stated no chest pain at present.Stated chest is sore.Stated she has sob just walking from room to room.Stated both lower legs are swollen.Appointment scheduled with Dr.Jordan 714/15 at 9:30 am.Advised to go to ER if needed.  Received call from [place] in triage from our [facility] office she stated Device Tech will be at [employer] 01/14/14 and she will interrogate patient's device tomorrow.

## 2014-01-13 NOTE — Telephone Encounter (Signed)
I spoke with the patient's husband. He reports that the patient has had intermittent CP with activity, SOB, and weakness that is new over the last few days. The patient gets SOB going from the bathroom to the bed. Her CP does not radiate. She has a persistent and dry cough for about 2 weeks. The patient has been taking injections for anemia and her last one was 01/09/14. The patient and her husband are concerned that something is going on with the device she has. She has a-fib, which is not new for her. They will try to send a manual transmission today for review and if no new findings there, I will forward this message to [facility], with Dr. Martinique for review.

## 2014-01-13 NOTE — Telephone Encounter (Signed)
New problem:      Pt's husband called to report pt can not sleep pass two nights, is having SOB and CP.   Pt's husband called to see if she can be seen.

## 2014-01-14 ENCOUNTER — Ambulatory Visit (INDEPENDENT_AMBULATORY_CARE_PROVIDER_SITE_OTHER): Payer: Medicare Other | Admitting: *Deleted

## 2014-01-14 ENCOUNTER — Ambulatory Visit (INDEPENDENT_AMBULATORY_CARE_PROVIDER_SITE_OTHER): Payer: Medicare Other

## 2014-01-14 ENCOUNTER — Ambulatory Visit (INDEPENDENT_AMBULATORY_CARE_PROVIDER_SITE_OTHER): Payer: Medicare Other | Admitting: Cardiology

## 2014-01-14 ENCOUNTER — Encounter: Payer: Self-pay | Admitting: Cardiology

## 2014-01-14 VITALS — BP 132/60 | HR 94 | Ht 63.0 in | Wt 233.3 lb

## 2014-01-14 DIAGNOSIS — R079 Chest pain, unspecified: Secondary | ICD-10-CM

## 2014-01-14 DIAGNOSIS — Z7901 Long term (current) use of anticoagulants: Secondary | ICD-10-CM

## 2014-01-14 DIAGNOSIS — I509 Heart failure, unspecified: Secondary | ICD-10-CM

## 2014-01-14 DIAGNOSIS — N183 Chronic kidney disease, stage 3 unspecified: Secondary | ICD-10-CM

## 2014-01-14 DIAGNOSIS — I4891 Unspecified atrial fibrillation: Secondary | ICD-10-CM

## 2014-01-14 DIAGNOSIS — I25718 Atherosclerosis of autologous vein coronary artery bypass graft(s) with other forms of angina pectoris: Secondary | ICD-10-CM

## 2014-01-14 DIAGNOSIS — I5043 Acute on chronic combined systolic (congestive) and diastolic (congestive) heart failure: Secondary | ICD-10-CM

## 2014-01-14 DIAGNOSIS — I5042 Chronic combined systolic (congestive) and diastolic (congestive) heart failure: Secondary | ICD-10-CM

## 2014-01-14 DIAGNOSIS — I209 Angina pectoris, unspecified: Secondary | ICD-10-CM

## 2014-01-14 DIAGNOSIS — Z5181 Encounter for therapeutic drug level monitoring: Secondary | ICD-10-CM

## 2014-01-14 DIAGNOSIS — I482 Chronic atrial fibrillation, unspecified: Secondary | ICD-10-CM

## 2014-01-14 DIAGNOSIS — I25810 Atherosclerosis of coronary artery bypass graft(s) without angina pectoris: Secondary | ICD-10-CM

## 2014-01-14 LAB — POCT INR: INR: 3.1

## 2014-01-14 MED ORDER — FUROSEMIDE 40 MG PO TABS: 80.0000 mg | ORAL_TABLET | Freq: Two times a day (BID) | ORAL | Status: DC

## 2014-01-14 NOTE — Patient Instructions (Signed)
Increase your lasix (furosemide) to 80 mg twice a day.  Weigh yourself daily  Avoid salt  We will see you in one week.  If your symptoms worsen then go to the hospital.

## 2014-01-14 NOTE — Progress Notes (Signed)
[name] Date of Birth: 12/31/1932 Medical Record #[id number]  History of Present Illness: [name] is seen back today for a work in visit.  She has had atrial fib with RVR and CHF. EF was down to 30%. Afib could not be regulated. Not a candidate for antiarrhythmics due to her multiple comorbidities/intolerances. A BiV pacemaker was implanted followed by AV nodal ablation in December 2013.  Echo was repeated in February 2014 and had improved with an EF up to 45 to 50%. Most recent Echo in 12/14 showed an EF of 40-45%. When seen in April she had significant volume overload. We increased her lasix to 60 mg bid. Since  lost 24 lbs. Her edema was markedly better. We reduced her lasix back to 40 mg bid on June 26. Since then she has marked increase in edema and has gained 25 lbs. Complains of increased SOB, orthopnea, and chest pressure. Can't do anything without becoming SOB. Also has a nonproductive cough. Does not weigh daily. Denies increased sodium intake.    Current Outpatient Prescriptions on File Prior to Visit  Medication Sig Dispense Refill  . amiodarone (PACERONE) 200 MG tablet Take 0.5 tablets (100 mg total) by mouth daily.  45 tablet  3  . amitriptyline (ELAVIL) 25 MG tablet Take 75 mg by mouth at bedtime.        Marland Kitchen atorvastatin (LIPITOR) 40 MG tablet Take 40 mg by mouth every evening.       . diltiazem (CARDIZEM CD) 240 MG 24 hr capsule Take 1 capsule (240 mg total) by mouth daily.  30 capsule  11  . donepezil (ARICEPT) 5 MG tablet Take 5 mg by mouth at bedtime.      Marland Kitchen esomeprazole (NEXIUM) 40 MG capsule Take 40 mg by mouth daily before breakfast.        . ezetimibe (ZETIA) 10 MG tablet Take 10 mg by mouth every evening.       . insulin glargine (LANTUS) 100 UNIT/ML injection Inject 45 Units into the skin 2 (two) times daily.       . insulin lispro (HUMALOG) 100 UNIT/ML injection Inject 0-20 Units into the skin 2 (two) times daily as needed for high blood sugar. Home sliding  scale If blood sugar is <200 take 15 units If blood sugar is >200 take 20 units If blood sugar is less than 70, don't take any      . levothyroxine (SYNTHROID, LEVOTHROID) 100 MCG tablet Take 100 mcg by mouth every morning.       . metoprolol succinate (TOPROL-XL) 50 MG 24 hr tablet Take 1 tablet (50 mg total) by mouth every morning.  30 tablet  6  . nitroGLYCERIN (NITROSTAT) 0.4 MG SL tablet Place 0.4 mg under the tongue every 5 (five) minutes as needed. For chest pain      . potassium chloride SA (K-DUR,KLOR-CON) 20 MEQ tablet take 1 tablet by mouth every morning  30 tablet  3  . warfarin (COUMADIN) 5 MG tablet 2 tablets everyday except 1.5 tablets on Sundays, Tuesdays and Thursdays or as directed by coumadin clinic  70 tablet  3  . [DISCONTINUED] enoxaparin (LOVENOX) 100 MG/ML SOLN Inject into the skin every 12 (twelve) hours.        . [DISCONTINUED] simvastatin (ZOCOR) 80 MG tablet Take 80 mg by mouth at bedtime.         No current facility-administered medications on file prior to visit.    Allergies  Allergen Reactions  .  Betadine [Povidone Iodine] Itching  . Capoten [Captopril] Other (See Comments)    unknown  . Codone [Hydrocodone] Nausea And Vomiting  . Methadone Nausea And Vomiting  . Penicillins Swelling  . Propoxyphene And Methadone     Intolerance to darvocet    Past Medical History  Diagnosis Date  . Coronary artery disease     a. Severe two vessel; s/p CABG (LIMA-LAD, SVG-diag, SVG-PDA);  b. 05/2012 NSTEMI in setting of rapid aflutter;  c. 06/2012 Lexiscan cardiolite: small, mild reversible defect in lateral wall->mild ischemia, low-risk->med Rx.  Marland Kitchen Hypertension   . Type II diabetes mellitus   . Hyperlipidemia   . Diabetic neuropathy   . CVA (cerebral vascular accident) 2004  . Hypothyroidism   . Chronic anemia   . GERD (gastroesophageal reflux disease)   . Arthritis   . Memory loss, short term   . COPD (chronic obstructive pulmonary disease)   . Blood  transfusion without reported diagnosis   . Chronic combined systolic and diastolic CHF (congestive heart failure)     a. 07/2012 Echo: EF 30%, Gr II DD, Mild AS/MR, Mod-Sev TR, mild bi-atrial and RV dil, PASP 75mmHg.  . Moderate mitral regurgitation     a. mod by TEE 2012, mild by echo 2013, 07/2012  . Severe tricuspid regurgitation     a. Severe by TEE 2012, mod by echo 2013, mod-sev by echo 07/2012  . Chronic vulvitis 07/1993  . Idiopathic anemia 2006  . Atrial fibrillation     a. Failed amiodarone (prolonged QT), not a candidate for class IC meds due to her CAD. No Multaq due to CHF hx;  b. 07/2012 s/p SJM Anthem RF Bi-V PPM, ser # [id number] and AV node RFCA;  c. chronic coumadin.  . Atrial flutter   . Pacemaker   . GI bleed     a. 10/2011 - Dr Benson Norway did scope which showed hiatal hernia, colon revealed diverticula (diverticular bleed), internal/ext hemorrhoids.    Past Surgical History  Procedure Laterality Date  . Coronary artery bypass graft  06/2009    X3, LIMA to LAD, SVG to diagonal, SVG to the posterior descending artery.  . Tonsillectomy    . Knee arthroscopy  08/2001  . Transthoracic echocardiogram  11/12/2010     Ejection fraction felt to be around 50%.  Wall thickness was increased in a pattern of mild LVH.  Moderately dilated left atrium.  Mildly dilated right atrium. Right ventricle was mildly dilated with mildly reduced systolic function  . Cardiac catheterization  06/09/2009    inferior wall hypokinesia with ejection fraction of 55%.  . Abdominal hysterectomy    . Joint replacement    . Tee without cardioversion  05/23/2011    Procedure: TRANSESOPHAGEAL ECHOCARDIOGRAM (TEE);  Surgeon: Lelon Perla, MD;  Location: [facility];  Service: Cardiovascular;  Laterality: N/A;  . Incision and drainage abscess  05/07/2012    Procedure: INCISION AND DRAINAGE ABSCESS;  Surgeon: Adin Hector, MD;  Location: [facility];  Service: General;  Laterality: N/A;  Groin wound  . Total abdominal  hysterectomy  1979  . Breast biopsy  Left  . Bilateral salpingectomy  10/2000  . Breast biopsy  09/18/2009    fibrocystic change  . Coronary artery bypass graft  06/2009  . Pacemaker insertion  08/2012  . Colonoscopy N/A 01/19/2013    Procedure: COLONOSCOPY;  Surgeon: Inda Castle, MD;  Location: [facility];  Service: Endoscopy;  Laterality: N/A;  . Esophagogastroduodenoscopy N/A 01/19/2013  Procedure: ESOPHAGOGASTRODUODENOSCOPY (EGD);  Surgeon: Inda Castle, MD;  Location: [facility];  Service: Endoscopy;  Laterality: N/A;    History  Smoking status  . Former Smoker  . Types: Cigarettes  Smokeless tobacco  . Never Used    History  Alcohol Use No    Family History  Problem Relation Age of Onset  . Colon cancer Mother   . Stroke Mother   . Hypertension Mother   . Cancer Mother     breast  . Cancer Father     colon  . Heart attack Father     x2  . Renal Disease Sister     dialysis-twin sister  . Renal Disease Sister     renal bypass    Review of Systems: The review of systems is per the HPI.   All other systems were reviewed and are negative.  Physical Exam: BP 132/60  Pulse 94  Ht 5\' 3"  (1.6 m)  Wt 233 lb 4.8 oz (105.824 kg)  BMI 41.34 kg/m2 Patient is  pleasant and in no acute distress. She has memory loss. Skin is warm and dry. Color is normal.  HEENT is unremarkable. Normocephalic/atraumatic. PERRL. Sclera are nonicteric. Neck is supple. No masses.  JVD to 10 cm. Lungs reveal basilar rales on the right. Cardiac exam shows a regular rate and rhythm. There is a grade 2/6 systolic murmur heard at the left sternal border.. Abdomen is soft. Extremities reveal 3+ edema  with chronic skin thickening. Gait and ROM are intact. No gross neurologic deficits noted.  LABORATORY DATA:  Lab Results  Component Value Date   WBC 6.4 01/09/2014   HGB 8.3* 01/09/2014   HCT 26.6* 01/09/2014   PLT 147 01/09/2014   GLUCOSE 116* 12/29/2013   CHOL 160 05/26/2012   TRIG 80  05/26/2012   HDL 29* 05/26/2012   LDLCALC 115* 05/26/2012   ALT 34 12/26/2013   AST 26 12/26/2013   NA 138 12/29/2013   K 4.0 12/29/2013   CL 97 12/29/2013   CREATININE 2.00* 12/29/2013   BUN 39* 12/29/2013   CO2 25 12/26/2013   TSH 1.257 01/18/2013   INR 1.8 12/27/2013   HGBA1C 8.0* 01/16/2013    Ecg: atrial fibrillation with rate 94 bpm and biventricular pacing.   Assessment / Plan:  1. Acute on Chronic systolic and diastolic heart failure -markedly volume overloaded today. Will increase Lasix to 80 mg bid. She responded well to this before. Will need to weigh daily. Avoid salt. Will arrange follow up in one week with [facility] and BNP. Will probably need to stay on higher lasix dose long term but renal function has been an issue. If symptoms worsen she is instructed to go to the ER and be admitted for IV diuresis.   2. S/p BiV pacemaker implant with AV node ablation. 99% biV paced.  3. Atrial fib -  now s/p AV node ablation. Now on amiodarone to see if this will reduce her time in AFib. Will follow up with Dr. Lovena Le.   4. Chronic coumadin therapy. Most recent INR was 1.8. Dose adjusted.  5. Memory loss.  6. CAD status post CABG.   7. Anemia- chronic. Followed by hematology. On Aranesp.

## 2014-01-15 LAB — MDC_IDC_ENUM_SESS_TYPE_INCLINIC
Battery Remaining Longevity: 30 mo
Battery Voltage: 2.9 V
Brady Statistic RA Percent Paced: 87 %
Brady Statistic RV Percent Paced: 99.96 %
Date Time Interrogation Session: 20150714135834
Implantable Pulse Generator Model: 3210
Implantable Pulse Generator Serial Number: 2853392
Lead Channel Impedance Value: 475 Ohm
Lead Channel Pacing Threshold Amplitude: 1.25 V
Lead Channel Pacing Threshold Pulse Width: 0.4 ms
Lead Channel Pacing Threshold Pulse Width: 1 ms
Lead Channel Pacing Threshold Pulse Width: 1 ms
Lead Channel Sensing Intrinsic Amplitude: 0.3 mV
Lead Channel Sensing Intrinsic Amplitude: 2.2 mV
Lead Channel Setting Pacing Amplitude: 2 V
Lead Channel Setting Pacing Amplitude: 2.75 V
Lead Channel Setting Pacing Pulse Width: 0.4 ms
Lead Channel Setting Sensing Sensitivity: 1.5 mV
MDC IDC MSMT LEADCHNL LV IMPEDANCE VALUE: 550 Ohm
MDC IDC MSMT LEADCHNL LV PACING THRESHOLD AMPLITUDE: 2 V
MDC IDC MSMT LEADCHNL LV PACING THRESHOLD AMPLITUDE: 2 V
MDC IDC MSMT LEADCHNL RA IMPEDANCE VALUE: 350 Ohm
MDC IDC MSMT LEADCHNL RV PACING THRESHOLD AMPLITUDE: 1.25 V
MDC IDC MSMT LEADCHNL RV PACING THRESHOLD PULSEWIDTH: 0.4 ms
MDC IDC SET LEADCHNL LV PACING PULSEWIDTH: 1 ms
MDC IDC SET LEADCHNL RV PACING AMPLITUDE: 2.5 V

## 2014-01-15 NOTE — Progress Notes (Signed)
CRT-P device check in clinic with [facility]. Normal device function. Thresholds, sensing, impedance consistent with previous measurements. Histograms appropriate for patient and level of activity. Presently in AFL + Pacerone 200/Warfarin. No ventricular high rate episodes. Patient bi-ventricularly pacing >99% of the time. Device programmed with appropriate safety margins. Estimated longevity 2.5-3 years.  Plan to follow up via [facility] on 10-15 and with [facility] in 11-2014.

## 2014-01-16 ENCOUNTER — Telehealth: Payer: Self-pay | Admitting: Cardiology

## 2014-01-16 ENCOUNTER — Inpatient Hospital Stay (HOSPITAL_COMMUNITY)
Admission: EM | Admit: 2014-01-16 | Discharge: 2014-01-22 | DRG: 292 | Disposition: A | Discharge status: Home Health | Payer: Medicare Other | Attending: Cardiology | Admitting: Cardiology

## 2014-01-16 ENCOUNTER — Emergency Department (HOSPITAL_COMMUNITY): Payer: Medicare Other

## 2014-01-16 ENCOUNTER — Encounter (HOSPITAL_COMMUNITY): Payer: Self-pay | Admitting: Emergency Medicine

## 2014-01-16 DIAGNOSIS — R0989 Other specified symptoms and signs involving the circulatory and respiratory systems: Secondary | ICD-10-CM | POA: Diagnosis present

## 2014-01-16 DIAGNOSIS — M129 Arthropathy, unspecified: Secondary | ICD-10-CM | POA: Diagnosis present

## 2014-01-16 DIAGNOSIS — Z7901 Long term (current) use of anticoagulants: Secondary | ICD-10-CM

## 2014-01-16 DIAGNOSIS — E785 Hyperlipidemia, unspecified: Secondary | ICD-10-CM | POA: Diagnosis present

## 2014-01-16 DIAGNOSIS — N183 Chronic kidney disease, stage 3 unspecified: Secondary | ICD-10-CM

## 2014-01-16 DIAGNOSIS — F0390 Unspecified dementia without behavioral disturbance: Secondary | ICD-10-CM | POA: Diagnosis present

## 2014-01-16 DIAGNOSIS — I428 Other cardiomyopathies: Secondary | ICD-10-CM | POA: Diagnosis present

## 2014-01-16 DIAGNOSIS — N189 Chronic kidney disease, unspecified: Secondary | ICD-10-CM

## 2014-01-16 DIAGNOSIS — I129 Hypertensive chronic kidney disease with stage 1 through stage 4 chronic kidney disease, or unspecified chronic kidney disease: Secondary | ICD-10-CM | POA: Diagnosis present

## 2014-01-16 DIAGNOSIS — Z951 Presence of aortocoronary bypass graft: Secondary | ICD-10-CM | POA: Diagnosis not present

## 2014-01-16 DIAGNOSIS — D631 Anemia in chronic kidney disease: Secondary | ICD-10-CM | POA: Diagnosis present

## 2014-01-16 DIAGNOSIS — D509 Iron deficiency anemia, unspecified: Secondary | ICD-10-CM | POA: Diagnosis present

## 2014-01-16 DIAGNOSIS — Z880 Allergy status to penicillin: Secondary | ICD-10-CM

## 2014-01-16 DIAGNOSIS — R0609 Other forms of dyspnea: Secondary | ICD-10-CM

## 2014-01-16 DIAGNOSIS — Z6841 Body Mass Index (BMI) 40.0 and over, adult: Secondary | ICD-10-CM

## 2014-01-16 DIAGNOSIS — I442 Atrioventricular block, complete: Secondary | ICD-10-CM

## 2014-01-16 DIAGNOSIS — I252 Old myocardial infarction: Secondary | ICD-10-CM | POA: Diagnosis not present

## 2014-01-16 DIAGNOSIS — E1139 Type 2 diabetes mellitus with other diabetic ophthalmic complication: Secondary | ICD-10-CM | POA: Diagnosis present

## 2014-01-16 DIAGNOSIS — E876 Hypokalemia: Secondary | ICD-10-CM | POA: Diagnosis not present

## 2014-01-16 DIAGNOSIS — K219 Gastro-esophageal reflux disease without esophagitis: Secondary | ICD-10-CM | POA: Diagnosis present

## 2014-01-16 DIAGNOSIS — I2510 Atherosclerotic heart disease of native coronary artery without angina pectoris: Secondary | ICD-10-CM | POA: Diagnosis present

## 2014-01-16 DIAGNOSIS — I4892 Unspecified atrial flutter: Secondary | ICD-10-CM

## 2014-01-16 DIAGNOSIS — Z950 Presence of cardiac pacemaker: Secondary | ICD-10-CM

## 2014-01-16 DIAGNOSIS — Z885 Allergy status to narcotic agent status: Secondary | ICD-10-CM

## 2014-01-16 DIAGNOSIS — I25119 Atherosclerotic heart disease of native coronary artery with unspecified angina pectoris: Secondary | ICD-10-CM

## 2014-01-16 DIAGNOSIS — I509 Heart failure, unspecified: Secondary | ICD-10-CM | POA: Diagnosis present

## 2014-01-16 DIAGNOSIS — I480 Paroxysmal atrial fibrillation: Secondary | ICD-10-CM

## 2014-01-16 DIAGNOSIS — R5381 Other malaise: Secondary | ICD-10-CM

## 2014-01-16 DIAGNOSIS — K922 Gastrointestinal hemorrhage, unspecified: Secondary | ICD-10-CM | POA: Diagnosis present

## 2014-01-16 DIAGNOSIS — J449 Chronic obstructive pulmonary disease, unspecified: Secondary | ICD-10-CM | POA: Diagnosis present

## 2014-01-16 DIAGNOSIS — I429 Cardiomyopathy, unspecified: Secondary | ICD-10-CM | POA: Diagnosis present

## 2014-01-16 DIAGNOSIS — I959 Hypotension, unspecified: Secondary | ICD-10-CM | POA: Diagnosis present

## 2014-01-16 DIAGNOSIS — E11319 Type 2 diabetes mellitus with unspecified diabetic retinopathy without macular edema: Secondary | ICD-10-CM | POA: Diagnosis present

## 2014-01-16 DIAGNOSIS — E118 Type 2 diabetes mellitus with unspecified complications: Secondary | ICD-10-CM

## 2014-01-16 DIAGNOSIS — N184 Chronic kidney disease, stage 4 (severe): Secondary | ICD-10-CM | POA: Diagnosis present

## 2014-01-16 DIAGNOSIS — E1142 Type 2 diabetes mellitus with diabetic polyneuropathy: Secondary | ICD-10-CM | POA: Diagnosis present

## 2014-01-16 DIAGNOSIS — I079 Rheumatic tricuspid valve disease, unspecified: Secondary | ICD-10-CM | POA: Diagnosis present

## 2014-01-16 DIAGNOSIS — I059 Rheumatic mitral valve disease, unspecified: Secondary | ICD-10-CM | POA: Diagnosis present

## 2014-01-16 DIAGNOSIS — E1149 Type 2 diabetes mellitus with other diabetic neurological complication: Secondary | ICD-10-CM | POA: Diagnosis present

## 2014-01-16 DIAGNOSIS — I4819 Other persistent atrial fibrillation: Secondary | ICD-10-CM

## 2014-01-16 DIAGNOSIS — J4489 Other specified chronic obstructive pulmonary disease: Secondary | ICD-10-CM | POA: Diagnosis present

## 2014-01-16 DIAGNOSIS — Z888 Allergy status to other drugs, medicaments and biological substances status: Secondary | ICD-10-CM | POA: Diagnosis not present

## 2014-01-16 DIAGNOSIS — I5043 Acute on chronic combined systolic (congestive) and diastolic (congestive) heart failure: Principal | ICD-10-CM

## 2014-01-16 DIAGNOSIS — E039 Hypothyroidism, unspecified: Secondary | ICD-10-CM | POA: Diagnosis present

## 2014-01-16 DIAGNOSIS — E559 Vitamin D deficiency, unspecified: Secondary | ICD-10-CM | POA: Diagnosis present

## 2014-01-16 DIAGNOSIS — I4891 Unspecified atrial fibrillation: Secondary | ICD-10-CM

## 2014-01-16 DIAGNOSIS — I441 Atrioventricular block, second degree: Secondary | ICD-10-CM | POA: Diagnosis present

## 2014-01-16 DIAGNOSIS — Z8673 Personal history of transient ischemic attack (TIA), and cerebral infarction without residual deficits: Secondary | ICD-10-CM

## 2014-01-16 DIAGNOSIS — D638 Anemia in other chronic diseases classified elsewhere: Secondary | ICD-10-CM | POA: Diagnosis present

## 2014-01-16 DIAGNOSIS — I209 Angina pectoris, unspecified: Secondary | ICD-10-CM

## 2014-01-16 DIAGNOSIS — N179 Acute kidney failure, unspecified: Secondary | ICD-10-CM | POA: Diagnosis present

## 2014-01-16 DIAGNOSIS — N039 Chronic nephritic syndrome with unspecified morphologic changes: Secondary | ICD-10-CM

## 2014-01-16 DIAGNOSIS — I471 Supraventricular tachycardia: Secondary | ICD-10-CM

## 2014-01-16 DIAGNOSIS — I4719 Other supraventricular tachycardia: Secondary | ICD-10-CM

## 2014-01-16 DIAGNOSIS — Z87891 Personal history of nicotine dependence: Secondary | ICD-10-CM | POA: Diagnosis not present

## 2014-01-16 DIAGNOSIS — Z794 Long term (current) use of insulin: Secondary | ICD-10-CM | POA: Diagnosis not present

## 2014-01-16 DIAGNOSIS — K625 Hemorrhage of anus and rectum: Secondary | ICD-10-CM

## 2014-01-16 LAB — BASIC METABOLIC PANEL
Anion gap: 16 — ABNORMAL HIGH (ref 5–15)
BUN: 36 mg/dL — ABNORMAL HIGH (ref 6–23)
CALCIUM: 8.4 mg/dL (ref 8.4–10.5)
CO2: 25 meq/L (ref 19–32)
Chloride: 96 mEq/L (ref 96–112)
Creatinine, Ser: 2.1 mg/dL — ABNORMAL HIGH (ref 0.50–1.10)
GFR calc Af Amer: 25 mL/min — ABNORMAL LOW (ref >90)
GFR calc non Af Amer: 21 mL/min — ABNORMAL LOW (ref >90)
GLUCOSE: 91 mg/dL (ref 70–99)
Potassium: 3.7 mEq/L (ref 3.7–5.3)
Sodium: 137 mEq/L (ref 137–147)

## 2014-01-16 LAB — PROTIME-INR
INR: 2.69 — AB (ref 0.00–1.49)
PROTHROMBIN TIME: 28.6 s — AB (ref 11.6–15.2)

## 2014-01-16 LAB — TROPONIN I

## 2014-01-16 LAB — CBC
HCT: 25.7 % — ABNORMAL LOW (ref 36.0–46.0)
Hemoglobin: 7.8 g/dL — ABNORMAL LOW (ref 12.0–15.0)
MCH: 26.5 pg (ref 26.0–34.0)
MCHC: 30.4 g/dL (ref 30.0–36.0)
MCV: 87.4 fL (ref 78.0–100.0)
Platelets: 235 10*3/uL (ref 150–400)
RBC: 2.94 MIL/uL — AB (ref 3.87–5.11)
RDW: 17.4 % — ABNORMAL HIGH (ref 11.5–15.5)
WBC: 5.6 10*3/uL (ref 4.0–10.5)

## 2014-01-16 LAB — I-STAT TROPONIN, ED: Troponin i, poc: 0.02 ng/mL (ref 0.00–0.08)

## 2014-01-16 LAB — PRO B NATRIURETIC PEPTIDE: Pro B Natriuretic peptide (BNP): 2918 pg/mL — ABNORMAL HIGH (ref 0–450)

## 2014-01-16 LAB — POC OCCULT BLOOD, ED: Fecal Occult Bld: POSITIVE — AB

## 2014-01-16 MED ORDER — INSULIN ASPART 100 UNIT/ML ~~LOC~~ SOLN
0.0000 [IU] | Freq: Three times a day (TID) | SUBCUTANEOUS | Status: DC
  Administered 2014-01-17 (×2): 3 [IU] via SUBCUTANEOUS
  Administered 2014-01-17: 2 [IU] via SUBCUTANEOUS
  Administered 2014-01-18: 3 [IU] via SUBCUTANEOUS
  Administered 2014-01-18: 5 [IU] via SUBCUTANEOUS
  Administered 2014-01-19 – 2014-01-20 (×2): 3 [IU] via SUBCUTANEOUS
  Administered 2014-01-20: 5 [IU] via SUBCUTANEOUS
  Administered 2014-01-21: 3 [IU] via SUBCUTANEOUS
  Administered 2014-01-21: 5 [IU] via SUBCUTANEOUS
  Administered 2014-01-22 (×2): 3 [IU] via SUBCUTANEOUS

## 2014-01-16 MED ORDER — FUROSEMIDE 10 MG/ML IJ SOLN
160.0000 mg | Freq: Once | INTRAVENOUS | Status: AC
  Administered 2014-01-16: 160 mg via INTRAVENOUS
  Filled 2014-01-16: qty 16

## 2014-01-16 MED ORDER — ATORVASTATIN CALCIUM 40 MG PO TABS
40.0000 mg | ORAL_TABLET | Freq: Every evening | ORAL | Status: DC
  Administered 2014-01-16 – 2014-01-21 (×6): 40 mg via ORAL
  Filled 2014-01-16 (×8): qty 1

## 2014-01-16 MED ORDER — DONEPEZIL HCL 10 MG PO TABS
10.0000 mg | ORAL_TABLET | Freq: Every day | ORAL | Status: DC
  Administered 2014-01-16 – 2014-01-21 (×6): 10 mg via ORAL
  Filled 2014-01-16 (×8): qty 1

## 2014-01-16 MED ORDER — NITROGLYCERIN 0.4 MG SL SUBL: 0.4000 mg | SUBLINGUAL_TABLET | SUBLINGUAL | Status: DC | PRN

## 2014-01-16 MED ORDER — INSULIN GLARGINE 100 UNIT/ML ~~LOC~~ SOLN
40.0000 [IU] | Freq: Two times a day (BID) | SUBCUTANEOUS | Status: DC
  Administered 2014-01-17 – 2014-01-19 (×6): 40 [IU] via SUBCUTANEOUS
  Filled 2014-01-16 (×8): qty 0.4

## 2014-01-16 MED ORDER — ACETAMINOPHEN 325 MG PO TABS
650.0000 mg | ORAL_TABLET | ORAL | Status: DC | PRN
  Administered 2014-01-17: 650 mg via ORAL
  Filled 2014-01-16: qty 2

## 2014-01-16 MED ORDER — SODIUM CHLORIDE 0.9 % IJ SOLN: 3.0000 mL | INTRAMUSCULAR | Status: DC | PRN

## 2014-01-16 MED ORDER — SODIUM CHLORIDE 0.9 % IJ SOLN
3.0000 mL | Freq: Two times a day (BID) | INTRAMUSCULAR | Status: DC
  Administered 2014-01-17 – 2014-01-22 (×8): 3 mL via INTRAVENOUS

## 2014-01-16 MED ORDER — PANTOPRAZOLE SODIUM 40 MG PO TBEC
40.0000 mg | DELAYED_RELEASE_TABLET | Freq: Every day | ORAL | Status: DC
  Administered 2014-01-16 – 2014-01-22 (×7): 40 mg via ORAL
  Filled 2014-01-16 (×7): qty 1

## 2014-01-16 MED ORDER — METOPROLOL SUCCINATE ER 50 MG PO TB24
50.0000 mg | ORAL_TABLET | Freq: Every day | ORAL | Status: DC
  Administered 2014-01-16 – 2014-01-22 (×7): 50 mg via ORAL
  Filled 2014-01-16 (×8): qty 1

## 2014-01-16 MED ORDER — DILTIAZEM HCL ER COATED BEADS 240 MG PO CP24
240.0000 mg | ORAL_CAPSULE | Freq: Every day | ORAL | Status: DC
  Administered 2014-01-16 – 2014-01-22 (×7): 240 mg via ORAL
  Filled 2014-01-16 (×8): qty 1

## 2014-01-16 MED ORDER — AMITRIPTYLINE HCL 75 MG PO TABS
75.0000 mg | ORAL_TABLET | Freq: Every day | ORAL | Status: DC
  Administered 2014-01-16 – 2014-01-21 (×6): 75 mg via ORAL
  Filled 2014-01-16 (×7): qty 1

## 2014-01-16 MED ORDER — POTASSIUM CHLORIDE CRYS ER 20 MEQ PO TBCR
20.0000 meq | EXTENDED_RELEASE_TABLET | Freq: Every day | ORAL | Status: DC
  Administered 2014-01-16 – 2014-01-20 (×5): 20 meq via ORAL
  Filled 2014-01-16 (×6): qty 1

## 2014-01-16 MED ORDER — POTASSIUM CHLORIDE CRYS ER 20 MEQ PO TBCR
20.0000 meq | EXTENDED_RELEASE_TABLET | Freq: Once | ORAL | Status: AC
  Administered 2014-01-16: 20 meq via ORAL
  Filled 2014-01-16: qty 1

## 2014-01-16 MED ORDER — AMIODARONE HCL 100 MG PO TABS
100.0000 mg | ORAL_TABLET | Freq: Every day | ORAL | Status: DC
  Administered 2014-01-16 – 2014-01-22 (×7): 100 mg via ORAL
  Filled 2014-01-16 (×8): qty 1

## 2014-01-16 MED ORDER — INSULIN LISPRO 100 UNIT/ML ~~LOC~~ SOLN: 20.0000 [IU] | Freq: Two times a day (BID) | SUBCUTANEOUS | Status: DC | PRN

## 2014-01-16 MED ORDER — EZETIMIBE 10 MG PO TABS
10.0000 mg | ORAL_TABLET | Freq: Every evening | ORAL | Status: DC
  Administered 2014-01-16 – 2014-01-21 (×6): 10 mg via ORAL
  Filled 2014-01-16 (×8): qty 1

## 2014-01-16 MED ORDER — ONDANSETRON HCL 4 MG/2ML IJ SOLN: 4.0000 mg | Freq: Four times a day (QID) | INTRAMUSCULAR | Status: DC | PRN

## 2014-01-16 MED ORDER — LEVOTHYROXINE SODIUM 100 MCG PO TABS
100.0000 ug | ORAL_TABLET | Freq: Every morning | ORAL | Status: DC
  Administered 2014-01-17 – 2014-01-22 (×6): 100 ug via ORAL
  Filled 2014-01-16 (×6): qty 1

## 2014-01-16 MED ORDER — SODIUM CHLORIDE 0.9 % IV SOLN: 250.0000 mL | INTRAVENOUS | Status: DC | PRN

## 2014-01-16 MED ORDER — INSULIN GLARGINE 100 UNIT/ML ~~LOC~~ SOLN: 50.0000 [IU] | Freq: Two times a day (BID) | SUBCUTANEOUS | Status: DC

## 2014-01-16 MED ORDER — INSULIN ASPART 100 UNIT/ML ~~LOC~~ SOLN
0.0000 [IU] | Freq: Every day | SUBCUTANEOUS | Status: DC
  Administered 2014-01-17: 2 [IU] via SUBCUTANEOUS
  Administered 2014-01-19: 3 [IU] via SUBCUTANEOUS
  Administered 2014-01-20: 4 [IU] via SUBCUTANEOUS
  Administered 2014-01-21: 3 [IU] via SUBCUTANEOUS

## 2014-01-16 NOTE — Telephone Encounter (Signed)
Please call,pt is no better. Daughter thinks she might need to go to the hospital.,

## 2014-01-16 NOTE — ED Notes (Signed)
Pt c/o increased SOB and fluid x several days; pt sts worse with exertion and is labored at present

## 2014-01-16 NOTE — ED Notes (Signed)
Pt reported having a feeling of fluttering in her chest (midsternum); reported this to doctor; Dr. Wyvonnia Dusky came in to see patient flutter went away and returned after Dr. Wyvonnia Dusky left.

## 2014-01-16 NOTE — ED Notes (Signed)
Called IV team/IV team responded

## 2014-01-16 NOTE — ED Provider Notes (Signed)
CSN: [id number]     Arrival date & time 01/16/14  1210 History   First MD Initiated Contact with Patient 01/16/14 1251     Chief Complaint  Patient presents with  . Shortness of Breath     (Consider location/radiation/quality/duration/timing/severity/associated sxs/prior Treatment) The history is provided by the patient, medical records and a relative.    [name] Is a 80 year old female with a past medical history of hypertension, chronic systolic and diastolic heart failure class, history of coronary artery disease, diabetes, chronic anemia, history of paroxysmal atrial flutter, history of GI bleed this emergency department with chief complaint of shortness of breath. History is provided by the patient and her daughter was in the room with her Patient states that her condition has been worsening over the past month. The patient saw her cardiologist this week and was noted to have a 20 pound weight gain over the past 2 weeks. She complains of dyspnea at rest, severe dyspnea with exertion, paroxysmal nocturnal dyspnea., orthopnea,. Patient states that her Lasix was increased from 40 mg twice a day to 80 mg twice a day. She's noticed minor improvement however she states her urinary output has not been as significant as she would have thought. She continues to have severe shortness of breath. Patient has a nonproductive cough. She denies chest pain,  Past Medical History  Diagnosis Date  . Coronary artery disease     a. Severe two vessel; s/p CABG (LIMA-LAD, SVG-diag, SVG-PDA);  b. 05/2012 NSTEMI in setting of rapid aflutter;  c. 06/2012 Lexiscan cardiolite: small, mild reversible defect in lateral wall->mild ischemia, low-risk->med Rx.  Marland Kitchen Hypertension   . Type II diabetes mellitus   . Hyperlipidemia   . Diabetic neuropathy   . CVA (cerebral vascular accident) 2004  . Hypothyroidism   . Chronic anemia   . GERD (gastroesophageal reflux disease)   . Arthritis   . Memory loss, short  term   . COPD (chronic obstructive pulmonary disease)   . Blood transfusion without reported diagnosis   . Chronic combined systolic and diastolic CHF (congestive heart failure)     a. 07/2012 Echo: EF 30%, Gr II DD, Mild AS/MR, Mod-Sev TR, mild bi-atrial and RV dil, PASP 61mmHg.  . Moderate mitral regurgitation     a. mod by TEE 2012, mild by echo 2013, 07/2012  . Severe tricuspid regurgitation     a. Severe by TEE 2012, mod by echo 2013, mod-sev by echo 07/2012  . Chronic vulvitis 07/1993  . Idiopathic anemia 2006  . Atrial fibrillation     a. Failed amiodarone (prolonged QT), not a candidate for class IC meds due to her CAD. No Multaq due to CHF hx;  b. 07/2012 s/p SJM Anthem RF Bi-V PPM, ser # [id number] and AV node RFCA;  c. chronic coumadin.  . Atrial flutter   . Pacemaker   . GI bleed     a. 10/2011 - Dr Benson Norway did scope which showed hiatal hernia, colon revealed diverticula (diverticular bleed), internal/ext hemorrhoids.   Past Surgical History  Procedure Laterality Date  . Coronary artery bypass graft  06/2009    X3, LIMA to LAD, SVG to diagonal, SVG to the posterior descending artery.  . Tonsillectomy    . Knee arthroscopy  08/2001  . Transthoracic echocardiogram  11/12/2010     Ejection fraction felt to be around 50%.  Wall thickness was increased in a pattern of mild LVH.  Moderately dilated left atrium.  Mildly dilated right  atrium. Right ventricle was mildly dilated with mildly reduced systolic function  . Cardiac catheterization  06/09/2009    inferior wall hypokinesia with ejection fraction of 55%.  . Abdominal hysterectomy    . Joint replacement    . Tee without cardioversion  05/23/2011    Procedure: TRANSESOPHAGEAL ECHOCARDIOGRAM (TEE);  Surgeon: Lelon Perla, MD;  Location: [facility];  Service: Cardiovascular;  Laterality: N/A;  . Incision and drainage abscess  05/07/2012    Procedure: INCISION AND DRAINAGE ABSCESS;  Surgeon: Adin Hector, MD;  Location: [facility];   Service: General;  Laterality: N/A;  Groin wound  . Total abdominal hysterectomy  1979  . Breast biopsy  Left  . Bilateral salpingectomy  10/2000  . Breast biopsy  09/18/2009    fibrocystic change  . Coronary artery bypass graft  06/2009  . Pacemaker insertion  08/2012  . Colonoscopy N/A 01/19/2013    Procedure: COLONOSCOPY;  Surgeon: Inda Castle, MD;  Location: [facility];  Service: Endoscopy;  Laterality: N/A;  . Esophagogastroduodenoscopy N/A 01/19/2013    Procedure: ESOPHAGOGASTRODUODENOSCOPY (EGD);  Surgeon: Inda Castle, MD;  Location: [facility];  Service: Endoscopy;  Laterality: N/A;   Family History  Problem Relation Age of Onset  . Colon cancer Mother   . Stroke Mother   . Hypertension Mother   . Cancer Mother     breast  . Cancer Father     colon  . Heart attack Father     x2  . Renal Disease Sister     dialysis-twin sister  . Renal Disease Sister     renal bypass   History  Substance Use Topics  . Smoking status: Former Smoker    Types: Cigarettes  . Smokeless tobacco: Never Used  . Alcohol Use: No   OB History   Grav Para Term Preterm Abortions TAB SAB Ect Mult Living   2 2 2       2      Review of Systems    Allergies  Betadine; Capoten; Codone; Methadone; Penicillins; and Propoxyphene and methadone  Home Medications   Prior to Admission medications   Medication Sig Start Date End Date Taking? Authorizing Provider  amiodarone (PACERONE) 200 MG tablet Take 0.5 tablets (100 mg total) by mouth daily. 06/11/13   Evans Lance, MD  amitriptyline (ELAVIL) 25 MG tablet Take 75 mg by mouth at bedtime.      Historical Provider, MD  atorvastatin (LIPITOR) 40 MG tablet Take 40 mg by mouth every evening.     Historical Provider, MD  diltiazem (CARDIZEM CD) 240 MG 24 hr capsule Take 1 capsule (240 mg total) by mouth daily. 02/08/13   Peter M Martinique, MD  donepezil (ARICEPT) 5 MG tablet Take 5 mg by mouth at bedtime.    Historical Provider, MD  esomeprazole  (NEXIUM) 40 MG capsule Take 40 mg by mouth daily before breakfast.      Historical Provider, MD  ezetimibe (ZETIA) 10 MG tablet Take 10 mg by mouth every evening.     Historical Provider, MD  furosemide (LASIX) 40 MG tablet Take 2 tablets (80 mg total) by mouth 2 (two) times daily. 01/14/14   Peter M Martinique, MD  insulin glargine (LANTUS) 100 UNIT/ML injection Inject 45 Units into the skin 2 (two) times daily.     Historical Provider, MD  insulin lispro (HUMALOG) 100 UNIT/ML injection Inject 0-20 Units into the skin 2 (two) times daily as needed for high blood sugar. Home  sliding scale If blood sugar is <200 take 15 units If blood sugar is >200 take 20 units If blood sugar is less than 70, don't take any    Historical Provider, MD  levothyroxine (SYNTHROID, LEVOTHROID) 100 MCG tablet Take 100 mcg by mouth every morning.     Historical Provider, MD  metoprolol succinate (TOPROL-XL) 50 MG 24 hr tablet Take 1 tablet (50 mg total) by mouth every morning. 10/02/13   Peter M Martinique, MD  nitroGLYCERIN (NITROSTAT) 0.4 MG SL tablet Place 0.4 mg under the tongue every 5 (five) minutes as needed. For chest pain    Historical Provider, MD  potassium chloride SA (K-DUR,KLOR-CON) 20 MEQ tablet take 1 tablet by mouth every morning    Peter M Martinique, MD  warfarin (COUMADIN) 5 MG tablet 2 tablets everyday except 1.5 tablets on Sundays, Tuesdays and Thursdays or as directed by coumadin clinic 11/18/13   Peter M Martinique, MD   BP 116/60  Pulse 77  Temp(Src) 97.4 F (36.3 C) (Oral)  Resp 20  SpO2 100% Physical Exam  Nursing note and vitals reviewed. Constitutional: She is oriented to person, place, and time. She appears well-developed and well-nourished. No distress.  HENT:  Head: Normocephalic and atraumatic.  Eyes: Conjunctivae are normal. No scleral icterus.  Neck: Normal range of motion.  Cardiovascular: Normal heart sounds and intact distal pulses.  An irregular rhythm present. Tachycardia present.  Exam  reveals no gallop and no friction rub.   No murmur heard. 3+ pitting edema BLLE   Pulmonary/Chest: Effort normal. No respiratory distress. She has rales.  Speaks in full sentences however winded by the end of sentence.  Abdominal: Soft. Bowel sounds are normal. She exhibits no distension and no mass. There is no tenderness. There is no guarding.  Neurological: She is alert and oriented to person, place, and time.  Skin: Skin is warm and dry. She is not diaphoretic.  Intertrigo.  Cornu cutaneum present in navel    ED Course  Procedures (including critical care time) Labs Review Labs Reviewed  CBC - Abnormal; Notable for the following:    RBC 2.94 (*)    Hemoglobin 7.8 (*)    HCT 25.7 (*)    RDW 17.4 (*)    All other components within normal limits  BASIC METABOLIC PANEL - Abnormal; Notable for the following:    BUN 36 (*)    Creatinine, Ser 2.10 (*)    GFR calc non Af Amer 21 (*)    GFR calc Af Amer 25 (*)    Anion gap 16 (*)    All other components within normal limits  PROTIME-INR - Abnormal; Notable for the following:    Prothrombin Time 28.6 (*)    INR 2.69 (*)    All other components within normal limits  PRO B NATRIURETIC PEPTIDE  OCCULT BLOOD X 1 CARD TO LAB, STOOL  I-STAT TROPOININ, ED    Imaging Review Dg Chest Port 1 View  01/16/2014   CLINICAL DATA:  Weight gain.  Shortness of breath.  EXAM: PORTABLE CHEST - 1 VIEW  COMPARISON:  01/16/2013.  FINDINGS: Cardiomegaly with pulmonary vascular prominence and interstitial prominence with small right pleural effusion noted. He has fine consistent with congestive heart failure. Cardiac pacer with lead tips in right atrium right ventricle. No pneumothorax. No acute boney abnormality.  IMPRESSION: Congestive heart failure or interstitial edema.   Electronically Signed   By: Marcello Moores  Register   On: 01/16/2014 13:51  EKG Interpretation   Date/Time:  Thursday January 16 2014 12:17:18 EDT Ventricular Rate:  98 PR Interval:     QRS Duration: 134 QT Interval:  386 QTC Calculation: 492 R Axis:   144 Text Interpretation:  Electronic ventricular pacemaker No significant  change was found Confirmed by Wyvonnia Dusky  MD, STEPHEN [phone]) on 01/16/2014  1:18:35 PM      MDM   Final diagnoses:  None   3:55 PM BP 121/63  Pulse 99  Temp(Src) 97.4 F (36.3 C) (Oral)  Resp 18  SpO2 99% Patient with pace maker and intermittent A-fib.  ekg shows a pace rhythm with apparent CHF exacerbation. Elevated PRO-bnp. Slight elevation in her creatinine, Hgb appears to have dropped by nearly 2 g since 2 weeks ago, however, I question if the hgb was acurate 2 weeks ago as her baseline appears to be around 8 or 9. However this could represent a GI bleed. Her family member states that she has known bleeding hemorrhoids.   I have placed a call tot cardiology who will consult on the patient . I have a pending consult with her pcp. Creatinine and BNP at [facility].  EKG shows intermittent paced rhythm.  Patient given 80mg  IV lasix.  4:59 PM I have spoken with Dr. Shon Baton regarding the patient suspected GI bleed and her drop in HGB. [facility] has agreed to admit, DR Virgina Jock sill manage her transfusion and consult with cardiology. The patient appears reasonably stabilized for admission considering the current resources, flow, and capabilities available in the ED at this time, and I doubt any other [facility] requiring further screening and/or treatment in the ED prior to admission.   Margarita Mail, PA-C 01/17/14 [phone]

## 2014-01-16 NOTE — H&P (Signed)
Chief Complaint: SOB  Primary Cardiologist: Dr. Martinique  Electrophysiologist: Dr. Lovena Le PCP: Dr. Virgina Jock   HPI: The patient is a 80 y/o female, followed by Dr. Martinique but Dr. Lovena Le sees her as well. She has a history of tachycardia mediated cardiomyopathy, and was thought to have chronic atrial fibrillation. She is on chronic warfarin therapy. She underwent AV node ablation and biventricular pacemaker insertion one year ago. Her ejection fraction improved from 30% to 45%. She also has known CAD, s/p CABG (LIMA-LAD, SVG-diag and SVG-PDA). Her last nuclear stress test was 06/08/2012 and was low risk.   She was recently seen in clinic by Dr. Martinique on 01/14/14, when she presented with complaints of increased SOB, orthopnea, chest pressure, marked increase in edema and weight gain. She was felt to be in a/c CHF. EKG also demonstrated atrial fibrillation with a ventricular response of 94 bpm. Dr. Martinique instructed her to increase her lasix to 80 mg BID. He instructed her to check weight daily and to avoid sodium intake. He ordered a f/u BMP and BNP, to be done in 7 days. She was instructed to report to the ER if her symptoms worsened.   Unfortunately, her symptoms did not improve. She continues to have DOE, orthopnea, LEE, weight gain and increased abdominal girth. She also notes feeling palpitations earlier this am. No syncope/near syncope or chest. Her daughter is present by her bedside and states that she has not been fully compliant with adherence to a low sodium diet.   In the ER, BNP is elevated at 2,918. CRX demonstrates cardiomegaly with interstitial edema. Scr elevated at 2.10. POC troponin is negative. EKG reads second degree AVB Mobits type II. She is anemic with Hgb at 7.8 (serial decreases from 11.2 -->8.3 -->7.8 over 2 week period).  FOBT in ER is positive. INR is therapeutic at 2.69. She tells me that she has noted melena and rectal bleeding periodically over the last several weeks and has  had to wear Depends. Review of her records reveal that she has a history of GI bleed in 2013.  Endoscopy at that time revealed a hiatal hernia. Colonoscopy revealed diverticular bleeding as well as internal/external hemorrhoids.  Past Medical History  Diagnosis Date  . Coronary artery disease     a. Severe two vessel; s/p CABG (LIMA-LAD, SVG-diag, SVG-PDA);  b. 05/2012 NSTEMI in setting of rapid aflutter;  c. 06/2012 Lexiscan cardiolite: small, mild reversible defect in lateral wall->mild ischemia, low-risk->med Rx.  Marland Kitchen Hypertension   . Type II diabetes mellitus   . Hyperlipidemia   . Diabetic neuropathy   . CVA (cerebral vascular accident) 2004  . Hypothyroidism   . Chronic anemia   . GERD (gastroesophageal reflux disease)   . Arthritis   . Memory loss, short term   . COPD (chronic obstructive pulmonary disease)   . Blood transfusion without reported diagnosis   . Chronic combined systolic and diastolic CHF (congestive heart failure)     a. 07/2012 Echo: EF 30%, Gr II DD, Mild AS/MR, Mod-Sev TR, mild bi-atrial and RV dil, PASP 84mHg.  . Moderate mitral regurgitation     a. mod by TEE 2012, mild by echo 2013, 07/2012  . Severe tricuspid regurgitation     a. Severe by TEE 2012, mod by echo 2013, mod-sev by echo 07/2012  . Chronic vulvitis 07/1993  . Idiopathic anemia 2006  . Atrial fibrillation     a. Failed amiodarone (prolonged QT), not a candidate for class IC meds due  to her CAD. No Multaq due to CHF hx;  b. 07/2012 s/p SJM Anthem RF Bi-V PPM, ser # [id number] and AV node RFCA;  c. chronic coumadin.  . Atrial flutter   . Pacemaker   . GI bleed     a. 10/2011 - Dr Benson Norway did scope which showed hiatal hernia, colon revealed diverticula (diverticular bleed), internal/ext hemorrhoids.    Past Surgical History  Procedure Laterality Date  . Coronary artery bypass graft  06/2009    X3, LIMA to LAD, SVG to diagonal, SVG to the posterior descending artery.  . Tonsillectomy    . Knee arthroscopy   08/2001  . Transthoracic echocardiogram  11/12/2010     Ejection fraction felt to be around 50%.  Wall thickness was increased in a pattern of mild LVH.  Moderately dilated left atrium.  Mildly dilated right atrium. Right ventricle was mildly dilated with mildly reduced systolic function  . Cardiac catheterization  06/09/2009    inferior wall hypokinesia with ejection fraction of 55%.  . Abdominal hysterectomy    . Joint replacement    . Tee without cardioversion  05/23/2011    Procedure: TRANSESOPHAGEAL ECHOCARDIOGRAM (TEE);  Surgeon: Lelon Perla, MD;  Location: [facility];  Service: Cardiovascular;  Laterality: N/A;  . Incision and drainage abscess  05/07/2012    Procedure: INCISION AND DRAINAGE ABSCESS;  Surgeon: Adin Hector, MD;  Location: [facility];  Service: General;  Laterality: N/A;  Groin wound  . Total abdominal hysterectomy  1979  . Breast biopsy  Left  . Bilateral salpingectomy  10/2000  . Breast biopsy  09/18/2009    fibrocystic change  . Coronary artery bypass graft  06/2009  . Pacemaker insertion  08/2012  . Colonoscopy N/A 01/19/2013    Procedure: COLONOSCOPY;  Surgeon: Inda Castle, MD;  Location: [facility];  Service: Endoscopy;  Laterality: N/A;  . Esophagogastroduodenoscopy N/A 01/19/2013    Procedure: ESOPHAGOGASTRODUODENOSCOPY (EGD);  Surgeon: Inda Castle, MD;  Location: [facility];  Service: Endoscopy;  Laterality: N/A;    Family History  Problem Relation Age of Onset  . Colon cancer Mother   . Stroke Mother   . Hypertension Mother   . Cancer Mother     breast  . Cancer Father     colon  . Heart attack Father     x2  . Renal Disease Sister     dialysis-twin sister  . Renal Disease Sister     renal bypass   Social History:  reports that she has quit smoking. Her smoking use included Cigarettes. She smoked 0.00 packs per day. She has never used smokeless tobacco. She reports that she does not drink alcohol or use illicit drugs.  Allergies:    Allergies  Allergen Reactions  . Betadine [Povidone Iodine] Itching  . Capoten [Captopril] Other (See Comments)    unknown  . Codone [Hydrocodone] Nausea And Vomiting  . Methadone Nausea And Vomiting  . Penicillins Swelling  . Propoxyphene And Methadone     Intolerance to darvocet     (Not in a hospital admission)  Results for orders placed during the hospital encounter of 01/16/14 (from the past 48 hour(s))  CBC     Status: Abnormal   Collection Time    01/16/14  1:14 PM      Result Value Ref Range   WBC 5.6  4.0 - 10.5 K/uL   RBC 2.94 (*) 3.87 - 5.11 MIL/uL   Hemoglobin 7.8 (*) 12.0 - 15.0  g/dL   HCT 25.7 (*) 36.0 - 46.0 %   MCV 87.4  78.0 - 100.0 fL   MCH 26.5  26.0 - 34.0 pg   MCHC 30.4  30.0 - 36.0 g/dL   RDW 17.4 (*) 11.5 - 15.5 %   Platelets 235  150 - 400 K/uL  BASIC METABOLIC PANEL     Status: Abnormal   Collection Time    01/16/14  1:14 PM      Result Value Ref Range   Sodium 137  137 - 147 mEq/L   Potassium 3.7  3.7 - 5.3 mEq/L   Chloride 96  96 - 112 mEq/L   CO2 25  19 - 32 mEq/L   Glucose, Bld 91  70 - 99 mg/dL   BUN 36 (*) 6 - 23 mg/dL   Creatinine, Ser 2.10 (*) 0.50 - 1.10 mg/dL   Calcium 8.4  8.4 - 10.5 mg/dL   GFR calc non Af Amer 21 (*) >90 mL/min   GFR calc Af Amer 25 (*) >90 mL/min   Comment: (NOTE)     The eGFR has been calculated using the CKD EPI equation.     This calculation has not been validated in all clinical situations.     eGFR's persistently <90 mL/min signify possible Chronic Kidney     Disease.   Anion gap 16 (*) 5 - 15  PRO B NATRIURETIC PEPTIDE     Status: Abnormal   Collection Time    01/16/14  1:14 PM      Result Value Ref Range   Pro B Natriuretic peptide (BNP) 2918.0 (*) 0 - 450 pg/mL  PROTIME-INR     Status: Abnormal   Collection Time    01/16/14  1:14 PM      Result Value Ref Range   Prothrombin Time 28.6 (*) 11.6 - 15.2 seconds   INR 2.69 (*) 0.00 - 1.49  I-STAT TROPOININ, ED     Status: None   Collection Time     01/16/14  1:20 PM      Result Value Ref Range   Troponin i, poc 0.02  0.00 - 0.08 ng/mL   Comment 3            Comment: Due to the release kinetics of cTnI,     a negative result within the first hours     of the onset of symptoms does not rule out     myocardial infarction with certainty.     If myocardial infarction is still suspected,     repeat the test at appropriate intervals.  POC OCCULT BLOOD, ED     Status: Abnormal   Collection Time    01/16/14  3:05 PM      Result Value Ref Range   Fecal Occult Bld POSITIVE (*) NEGATIVE   Dg Chest Port 1 View  01/16/2014   CLINICAL DATA:  Weight gain.  Shortness of breath.  EXAM: PORTABLE CHEST - 1 VIEW  COMPARISON:  01/16/2013.  FINDINGS: Cardiomegaly with pulmonary vascular prominence and interstitial prominence with small right pleural effusion noted. He has fine consistent with congestive heart failure. Cardiac pacer with lead tips in right atrium right ventricle. No pneumothorax. No acute boney abnormality.  IMPRESSION: Congestive heart failure or interstitial edema.   Electronically Signed   By: Marcello Moores  Register   On: 01/16/2014 13:51    Review of Systems  Constitutional: Positive for diaphoresis.  Respiratory: Positive for shortness of breath.   Cardiovascular: Positive  for palpitations, orthopnea and leg swelling. Negative for chest pain.  Neurological: Negative for dizziness and loss of consciousness.  All other systems reviewed and are negative.   Blood pressure 121/64, pulse 65, temperature 97.4 F (36.3 C), temperature source Oral, resp. rate 16, SpO2 97.00%. Physical Exam  Constitutional: She is oriented to person, place, and time. She appears well-developed and well-nourished. No distress.  Neck: JVD (at level of the ear) present.  Cardiovascular: Normal rate and intact distal pulses.  An irregularly irregular rhythm present. Exam reveals friction rub. Exam reveals no gallop.   No murmur heard. Pulses:      Radial  pulses are 2+ on the right side, and 2+ on the left side.       Dorsalis pedis pulses are 2+ on the right side, and 2+ on the left side.  Respiratory: No respiratory distress. She has no wheezes. She has rales.  Bibasilar R>L   Musculoskeletal: She exhibits edema (1+ bilateral LEE).  Neurological: She is alert and oriented to person, place, and time.  Skin: Skin is warm and dry.     Assessment/Plan Active Problems:   Atrial fibrillation   Chronic anticoagulation   GI bleed   CKD (chronic kidney disease) stage 3, GFR 30-59 ml/min   Acute on chronic combined systolic and diastolic congestive heart failure, NYHA class 4   Coronary artery disease   Hx of CABG   Pacemaker    1. Acute on chronic CHF: BMP elevated at 2900.  Chest x-ray also consistent with CHF findings of cardiomegaly and edema. Likely secondary to dietary indiscretion. Continue IV Lasix.  Strict I/Os. Daily weights.  Low sodium diet.  Daily BMPs to follow renal function and electrolytes.  2. Atrial fibrillation:  Rate is currently controlled.  Patient noted rapid palpitations earlier today.  Will plan for device interrogation.  3. CAD:  History of CABG.  Low risk nuclear stress test in 2013. Denies recent chest pain.  4. Anemia:  Hemoglobin 7.8.  She has had serial decreases in Hgb over the last 2 weeks from 11.2 -->8.3 -->7.8. FOBT is positive.  She has a prior history of GI bleeding in 2013. Her PCP, Dr. Virgina Jock is aware and has agreed to follow her for this.   5. Chronic oral anticoagulation:  In the setting of suspected GI bleed will hold warfarin until workup is complete.  Her INR today is therapeutic at 2.69.   6. Second degree AV block Mobitz type II:  She has a pacemaker  7.  CKD:  Serum creatinine is 2.10.  Will need to monitor closely in the setting of IV diuretic therapy.     Marqual Mi 01/16/2014, 6:14 PM

## 2014-01-16 NOTE — H&P (Signed)
Pt. Seen and examined. Agree with the NP/PA-C note as written.  Pleasant 80 yo female with several weeks of progressive DOE, weight gain, LE edema and orthopnea. She recently had her diuretics decreased. There may be dietary indiscretion as well. She also reports fluttering in her chest around her device. Exam consistent with acute on chronic systolic heart failure exacerbation. She is also becoming progressively anemic. This was thought to be due to hemorrhoids, although she reports dark stools and dripping BRPBR at times. Her daughter does not believe she has had a GI evaluation. BNP elevated at 2918. Troponin negative. Creatinine is 2.1 which appears chronically elevated. H/H are 7.8 and 25. INR 2.69 and FOBT is positive.   Agree with admission for diuresis. Will need to be careful if she is having active GI bleeding. Hold warfarin. Also, she is borderline hypotensive and may need low dose dobutamine to support diuresis if her output is not great given her CKD. Last reported EF is 40-45%.  Appreciate Dr. Keane Police assistance in management of GI bleeding.  We will have her device interrogated due to her complaint of fluttering in the chest.  Pixie Casino, MD, [facility] Attending Cardiologist [place]

## 2014-01-16 NOTE — Telephone Encounter (Signed)
Returned call to patient's daughter she stated her mother is no better,still very sob and weak.Advised to take her to [facility] ER.Trish called.

## 2014-01-16 NOTE — Progress Notes (Signed)
[name] is an 79 y.o. female.   PCP:   [name], MD   Chief Complaint:  Vol Overload  HPI: 28 F with known CAD s/P 3v CABG 06/2009, Prior MI, DM2 - Insulin requiring but poor A1C 8.8% - Neuropathy/ Non-Prolif DM Retinopathy,  CM c EF 30-45% Dr Martinique. 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 LE Edema/CVI/venous Stasis, Mild AV Stenosis/Mild MR/TR/Mild Pulm HTN. Chronic DOE,  Morbid Obesity, LGIB 10/2011 and Angiodysplasia with history of Hemoccult-positive stools, R Goin Abscess 05/2012, Gastroesophageal reflux disease/HH. She presented to Dr Martinique 7/14 having gained back 25 #s and being Vol Overloaded.  More DOE/SOB.  nonproductive cough.  He increased her lasix.  She was also recently seen by Heme - Dr Juliann Mule = 6/25 and Hbg was 8.9.  Had Aranesp 200 on 7/9 and 6/25   She is Heme + today in ED - When she last had GIB she was scoped per Dr Benson Norway = GI bleed  a. 10/2011 - Dr Benson Norway did scope which showed hiatal hernia, colon revealed diverticula (diverticular bleed), internal/ext hemorrhoids.   On eval + SOB c movement and flutter. No other issues x edema. No Melena or Hematochezia.   Past Medical History:  Past Medical History  Diagnosis Date  . Coronary artery disease     a. Severe two vessel; s/p CABG (LIMA-LAD, SVG-diag, SVG-PDA);  b. 05/2012 NSTEMI in setting of rapid aflutter;  c. 06/2012 Lexiscan cardiolite: small, mild reversible defect in lateral wall->mild ischemia, low-risk->med Rx.  Marland Kitchen Hypertension   . Type II diabetes mellitus   . Hyperlipidemia   . Diabetic neuropathy   . CVA (cerebral vascular accident) 2004  . Hypothyroidism   . Chronic anemia   . GERD  (gastroesophageal reflux disease)   . Arthritis   . Memory loss, short term   . COPD (chronic obstructive pulmonary disease)   . Blood transfusion without reported diagnosis   . Chronic combined systolic and diastolic CHF (congestive heart failure)     a. 07/2012 Echo: EF 30%, Gr II DD, Mild AS/MR, Mod-Sev TR, mild bi-atrial and RV dil, PASP 52mmHg.  . Moderate mitral regurgitation     a. mod by TEE 2012, mild by echo 2013, 07/2012  . Severe tricuspid regurgitation     a. Severe by TEE 2012, mod by echo 2013, mod-sev by echo 07/2012  . Chronic vulvitis 07/1993  . Idiopathic anemia 2006  . Atrial fibrillation     a. Failed amiodarone (prolonged QT), not a candidate for class IC meds due to her CAD. No Multaq due to CHF hx;  b. 07/2012 s/p SJM Anthem RF Bi-V PPM, ser # [id number] and AV node RFCA;  c. chronic coumadin.  . Atrial flutter   . Pacemaker   . GI bleed     a. 10/2011 - Dr Benson Norway did scope which showed hiatal hernia, colon revealed diverticula (diverticular bleed), internal/ext hemorrhoids.    Past Surgical History  Procedure Laterality Date  . Coronary artery bypass graft  06/2009    X3, LIMA to LAD, SVG to diagonal, SVG to the posterior descending artery.  . Tonsillectomy    . Knee arthroscopy  08/2001  . Transthoracic echocardiogram  11/12/2010  Ejection fraction felt to be around 50%.  Wall thickness was increased in a pattern of mild LVH.  Moderately dilated left atrium.  Mildly dilated right atrium. Right ventricle was mildly dilated with mildly reduced systolic function  . Cardiac catheterization  06/09/2009    inferior wall hypokinesia with ejection fraction of 55%.  . Abdominal hysterectomy    . Joint replacement    . Tee without cardioversion  05/23/2011    Procedure: TRANSESOPHAGEAL ECHOCARDIOGRAM (TEE);  Surgeon: Lelon Perla, MD;  Location: [facility];  Service: Cardiovascular;  Laterality: N/A;  . Incision and drainage abscess  05/07/2012    Procedure: INCISION AND  DRAINAGE ABSCESS;  Surgeon: Adin Hector, MD;  Location: [facility];  Service: General;  Laterality: N/A;  Groin wound  . Total abdominal hysterectomy  1979  . Breast biopsy  Left  . Bilateral salpingectomy  10/2000  . Breast biopsy  09/18/2009    fibrocystic change  . Coronary artery bypass graft  06/2009  . Pacemaker insertion  08/2012  . Colonoscopy N/A 01/19/2013    Procedure: COLONOSCOPY;  Surgeon: Inda Castle, MD;  Location: [facility];  Service: Endoscopy;  Laterality: N/A;  . Esophagogastroduodenoscopy N/A 01/19/2013    Procedure: ESOPHAGOGASTRODUODENOSCOPY (EGD);  Surgeon: Inda Castle, MD;  Location: [facility];  Service: Endoscopy;  Laterality: N/A;      Allergies:   Allergies  Allergen Reactions  . Betadine [Povidone Iodine] Itching  . Capoten [Captopril] Other (See Comments)    unknown  . Codone [Hydrocodone] Nausea And Vomiting  . Methadone Nausea And Vomiting  . Penicillins Swelling  . Propoxyphene And Methadone     Intolerance to darvocet     Medications: Prior to Admission medications   Medication Sig Start Date End Date Taking? Authorizing Provider  amiodarone (PACERONE) 200 MG tablet Take 100 mg by mouth daily.   Yes Historical Provider, MD  amitriptyline (ELAVIL) 25 MG tablet Take 75 mg by mouth at bedtime.     Yes Historical Provider, MD  atorvastatin (LIPITOR) 40 MG tablet Take 40 mg by mouth every evening.    Yes Historical Provider, MD  diltiazem (CARDIZEM CD) 240 MG 24 hr capsule Take 240 mg by mouth daily.   Yes Historical Provider, MD  donepezil (ARICEPT) 10 MG tablet Take 10 mg by mouth at bedtime.   Yes Historical Provider, MD  esomeprazole (NEXIUM) 40 MG capsule Take 40 mg by mouth daily before breakfast.     Yes Historical Provider, MD  ezetimibe (ZETIA) 10 MG tablet Take 10 mg by mouth every evening.    Yes Historical Provider, MD  furosemide (LASIX) 40 MG tablet Take 80 mg by mouth 2 (two) times daily.   Yes Historical Provider, MD   insulin glargine (LANTUS) 100 UNIT/ML injection Inject 50 Units into the skin 2 (two) times daily.    Yes Historical Provider, MD  insulin lispro (HUMALOG) 100 UNIT/ML injection Inject 20-25 Units into the skin 2 (two) times daily as needed for high blood sugar. Home sliding scale If blood sugar is <200 take 20 units If blood sugar is >200 take 25 units If blood sugar is less than 70, don't take any   Yes Historical Provider, MD  levothyroxine (SYNTHROID, LEVOTHROID) 100 MCG tablet Take 100 mcg by mouth every morning.    Yes Historical Provider, MD  metoprolol succinate (TOPROL-XL) 50 MG 24 hr tablet Take 50 mg by mouth daily. Take with or immediately following a meal.   Yes Historical  Provider, MD  Multiple Vitamins-Minerals (ICAPS AREDS FORMULA PO) Take 1 tablet by mouth 2 (two) times daily.   Yes Historical Provider, MD  nitroGLYCERIN (NITROSTAT) 0.4 MG SL tablet Place 0.4 mg under the tongue every 5 (five) minutes as needed. For chest pain   Yes Historical Provider, MD  potassium chloride SA (K-DUR,KLOR-CON) 20 MEQ tablet Take 20 mEq by mouth daily.   Yes Historical Provider, MD  warfarin (COUMADIN) 5 MG tablet Take 7.5-10 mg by mouth daily. Take 10mg  by mouth on Sunday, Monday, Wednesday, Friday and Saturday. Take 7.5mg  by mouth on Tuesday and Thursday.   Yes Historical Provider, MD      (Not in a hospital admission)   Social History:  reports that she has quit smoking. Her smoking use included Cigarettes. She smoked 0.00 packs per day. She has never used smokeless tobacco. She reports that she does not drink alcohol or use illicit drugs.  Family History: Family History  Problem Relation Age of Onset  . Colon cancer Mother   . Stroke Mother   . Hypertension Mother   . Cancer Mother     breast  . Cancer Father     colon  . Heart attack Father     x2  . Renal Disease Sister     dialysis-twin sister  . Renal Disease Sister     renal bypass    Review of Systems:  Review of  Systems - See hpi   Physical Exam:  Blood pressure 97/80, pulse 97, temperature 97.4 F (36.3 C), temperature source Oral, resp. rate 22, SpO2 95.00%. Filed Vitals:   01/16/14 1630 01/16/14 1700 01/16/14 1705 01/16/14 1819  BP: 126/75 121/64 121/64 97/80  Pulse: 66 65  97  Temp:      TempSrc:      Resp: 18 19 16 22   SpO2: 96% 95% 97% 95%   General appearance: A and O to person and place Head: Normocephalic, without obvious abnormality, atraumatic Eyes: conjunctivae/corneas clear. PERRL, EOM's intact.  Nose: Nares normal. Septum midline. Mucosa normal. No drainage or sinus tenderness. Throat: lips, mucosa, and tongue normal; teeth and gums normal Neck: + JVD Resp: + Rales Cardio: Reg. Fast. Paced GI: Obese. soft, non-tender; bowel sounds normal; no masses,  no organomegaly Extremities: extremities normal, atraumatic, no cyanosis or edema Pulses: 2+ and symmetric Lymph nodes: no cervical lymphadenopathy Neurologic: Alert and oriented X 3, normal strength and tone. Normal symmetric reflexes.     Labs on Admission:   Recent Labs  01/16/14 1314  NA 137  K 3.7  CL 96  CO2 25  GLUCOSE 91  BUN 36*  CREATININE 2.10*  CALCIUM 8.4   No results found for this basename: AST, ALT, ALKPHOS, BILITOT, PROT, ALBUMIN,  in the last 72 hours No results found for this basename: LIPASE, AMYLASE,  in the last 72 hours  Recent Labs  01/16/14 1314  WBC 5.6  HGB 7.8*  HCT 25.7*  MCV 87.4  PLT 235   No results found for this basename: CKTOTAL, CKMB, CKMBINDEX, TROPONINI,  in the last 72 hours Lab Results  Component Value Date   INR 2.69* 01/16/2014   INR 3.1 01/14/2014   INR 1.8 12/27/2013     LAB RESULT POCT:  Results for orders placed during the hospital encounter of 01/16/14  CBC      Result Value Ref Range   WBC 5.6  4.0 - 10.5 K/uL   RBC 2.94 (*) 3.87 - 5.11 MIL/uL  Hemoglobin 7.8 (*) 12.0 - 15.0 g/dL   HCT 25.7 (*) 36.0 - 46.0 %   MCV 87.4  78.0 - 100.0 fL   MCH 26.5   26.0 - 34.0 pg   MCHC 30.4  30.0 - 36.0 g/dL   RDW 17.4 (*) 11.5 - 15.5 %   Platelets 235  150 - 400 K/uL  BASIC METABOLIC PANEL      Result Value Ref Range   Sodium 137  137 - 147 mEq/L   Potassium 3.7  3.7 - 5.3 mEq/L   Chloride 96  96 - 112 mEq/L   CO2 25  19 - 32 mEq/L   Glucose, Bld 91  70 - 99 mg/dL   BUN 36 (*) 6 - 23 mg/dL   Creatinine, Ser 2.10 (*) 0.50 - 1.10 mg/dL   Calcium 8.4  8.4 - 10.5 mg/dL   GFR calc non Af Amer 21 (*) >90 mL/min   GFR calc Af Amer 25 (*) >90 mL/min   Anion gap 16 (*) 5 - 15  PRO B NATRIURETIC PEPTIDE      Result Value Ref Range   Pro B Natriuretic peptide (BNP) 2918.0 (*) 0 - 450 pg/mL  PROTIME-INR      Result Value Ref Range   Prothrombin Time 28.6 (*) 11.6 - 15.2 seconds   INR 2.69 (*) 0.00 - 1.49  I-STAT TROPOININ, ED      Result Value Ref Range   Troponin i, poc 0.02  0.00 - 0.08 ng/mL   Comment 3           POC OCCULT BLOOD, ED      Result Value Ref Range   Fecal Occult Bld POSITIVE (*) NEGATIVE      Radiological Exams on Admission: Dg Chest Port 1 View  01/16/2014   CLINICAL DATA:  Weight gain.  Shortness of breath.  EXAM: PORTABLE CHEST - 1 VIEW  COMPARISON:  01/16/2013.  FINDINGS: Cardiomegaly with pulmonary vascular prominence and interstitial prominence with small right pleural effusion noted. He has fine consistent with congestive heart failure. Cardiac pacer with lead tips in right atrium right ventricle. No pneumothorax. No acute boney abnormality.  IMPRESSION: Congestive heart failure or interstitial edema.   Electronically Signed   By: Marcello Moores  Register   On: 01/16/2014 13:51      Orders placed during the hospital encounter of 01/16/14  . EKG 12-LEAD  . EKG 12-LEAD  . EKG 12-LEAD  . EKG 12-LEAD  . ED EKG  . ED EKG  . ED EKG  . ED EKG  . EKG 12-LEAD  . EKG 12-LEAD     Assessment/Plan Active Problems:   Atrial fibrillation   Chronic anticoagulation   GI bleed   CKD (chronic kidney disease) stage 3, GFR 30-59  ml/min   Acute on chronic combined systolic and diastolic congestive heart failure, NYHA class 4   Coronary artery disease   Hx of CABG   Pacemaker   CHF (congestive heart failure)  Acute on chronic CHF - Vol Overload - needs Sig Diuresis. ? Dobutamine/Milrinone?  Afib/CHF/Pacer - biventricular pacemaker/CAD/3v CABG/Prior MI.  Per cards.  On meds.  Device to be interogated  AFlutter - Amio  CKDz stage 4 c baseline Crs 1.8 - 2.1. This is stable today.  Follow c IV Lasix usage  COPD - S/P 40 Pk Yr Tobacco.  Quit 1975  DM2 - Insulin Requiring.  Titrate Lantus/Humalog.  She usually needs less medication in hospital as I do not  believe she does her meds correctly at home.  Dementia - Stable.  Last MMSE 25/30. Remains on Donepezil 10  Chronic Anticoagulation - Goal INR 2.0 - 2.5.  On Hold until Hbg better  Known Mild - Mod AS   MGUS - since @ 2004 s progression.  Worsening Anemia c hbg 7.8 - some may be dilutional but after some diuresis she will need Transfusion. She is UTD on scopes and despite Heme + she is not acting like an acute GI Bleed. Will follow.  Probably will not need GI W/up  AFTT - Chronic and family helps in her care  Morbid Obesity.  Desperately needs weight loss.  Prior CVA 04/2003.  RF Reduce.  GERD - Stay On PPI  Lipids - Lipitor and Zetia  HTN - BP fine.  Hypothyroid - on meds  Will follow along c you and adjust things where needed.   Denisa Enterline M 01/16/2014, 6:44 PM

## 2014-01-17 ENCOUNTER — Encounter (HOSPITAL_COMMUNITY): Payer: Self-pay | Admitting: *Deleted

## 2014-01-17 DIAGNOSIS — I442 Atrioventricular block, complete: Secondary | ICD-10-CM

## 2014-01-17 DIAGNOSIS — I498 Other specified cardiac arrhythmias: Secondary | ICD-10-CM

## 2014-01-17 LAB — TROPONIN I
Troponin I: <0.3 ng/mL (ref <0.30)
Troponin I: <0.3 ng/mL (ref <0.30)

## 2014-01-17 LAB — IRON AND TIBC
Iron: 19 ug/dL — ABNORMAL LOW (ref 42–135)
SATURATION RATIOS: 5 % — AB (ref 20–55)
TIBC: 353 ug/dL (ref 250–470)
UIBC: 334 ug/dL (ref 125–400)

## 2014-01-17 LAB — GLUCOSE, CAPILLARY
GLUCOSE-CAPILLARY: 120 mg/dL — AB (ref 70–99)
GLUCOSE-CAPILLARY: 123 mg/dL — AB (ref 70–99)
Glucose-Capillary: 155 mg/dL — ABNORMAL HIGH (ref 70–99)
Glucose-Capillary: 207 mg/dL — ABNORMAL HIGH (ref 70–99)

## 2014-01-17 LAB — PREPARE RBC (CROSSMATCH)

## 2014-01-17 LAB — BASIC METABOLIC PANEL
ANION GAP: 15 (ref 5–15)
BUN: 36 mg/dL — ABNORMAL HIGH (ref 6–23)
CALCIUM: 8.4 mg/dL (ref 8.4–10.5)
CO2: 26 mEq/L (ref 19–32)
Chloride: 96 mEq/L (ref 96–112)
Creatinine, Ser: 2.25 mg/dL — ABNORMAL HIGH (ref 0.50–1.10)
GFR, EST AFRICAN AMERICAN: 23 mL/min — AB (ref >90)
GFR, EST NON AFRICAN AMERICAN: 20 mL/min — AB (ref >90)
Glucose, Bld: 136 mg/dL — ABNORMAL HIGH (ref 70–99)
POTASSIUM: 4 meq/L (ref 3.7–5.3)
Sodium: 137 mEq/L (ref 137–147)

## 2014-01-17 LAB — MRSA PCR SCREENING: MRSA BY PCR: NEGATIVE

## 2014-01-17 LAB — CBC
HEMATOCRIT: 25.7 % — AB (ref 36.0–46.0)
Hemoglobin: 7.8 g/dL — ABNORMAL LOW (ref 12.0–15.0)
MCH: 26.8 pg (ref 26.0–34.0)
MCHC: 30.4 g/dL (ref 30.0–36.0)
MCV: 88.3 fL (ref 78.0–100.0)
PLATELETS: 249 10*3/uL (ref 150–400)
RBC: 2.91 MIL/uL — ABNORMAL LOW (ref 3.87–5.11)
RDW: 17.6 % — AB (ref 11.5–15.5)
WBC: 5.3 10*3/uL (ref 4.0–10.5)

## 2014-01-17 LAB — PROTIME-INR
INR: 3.12 — ABNORMAL HIGH (ref 0.00–1.49)
Prothrombin Time: 32.1 seconds — ABNORMAL HIGH (ref 11.6–15.2)

## 2014-01-17 LAB — TSH: TSH: 0.653 u[IU]/mL (ref 0.350–4.500)

## 2014-01-17 MED ORDER — METOLAZONE 5 MG PO TABS
5.0000 mg | ORAL_TABLET | Freq: Every day | ORAL | Status: DC
  Administered 2014-01-17 – 2014-01-18 (×2): 5 mg via ORAL
  Filled 2014-01-17 (×2): qty 1

## 2014-01-17 MED ORDER — FUROSEMIDE 10 MG/ML IJ SOLN
160.0000 mg | Freq: Two times a day (BID) | INTRAVENOUS | Status: DC
  Administered 2014-01-17: 160 mg via INTRAVENOUS
  Filled 2014-01-17 (×3): qty 16

## 2014-01-17 MED ORDER — GUAIFENESIN-DM 100-10 MG/5ML PO SYRP
15.0000 mL | ORAL_SOLUTION | ORAL | Status: DC | PRN
  Administered 2014-01-17: 15 mL via ORAL
  Filled 2014-01-17: qty 15

## 2014-01-17 MED ORDER — FUROSEMIDE 10 MG/ML IJ SOLN
120.0000 mg | Freq: Once | INTRAVENOUS | Status: AC
  Administered 2014-01-17: 120 mg via INTRAVENOUS
  Filled 2014-01-17: qty 12

## 2014-01-17 MED ORDER — BENZONATATE 100 MG PO CAPS
200.0000 mg | ORAL_CAPSULE | Freq: Three times a day (TID) | ORAL | Status: AC
  Administered 2014-01-17 – 2014-01-21 (×14): 200 mg via ORAL
  Filled 2014-01-17 (×15): qty 2

## 2014-01-17 MED ORDER — ALPRAZOLAM 0.25 MG PO TABS
0.2500 mg | ORAL_TABLET | Freq: Two times a day (BID) | ORAL | Status: DC | PRN
  Administered 2014-01-17 – 2014-01-21 (×7): 0.25 mg via ORAL
  Filled 2014-01-17 (×7): qty 1

## 2014-01-17 NOTE — Progress Notes (Signed)
Utilization review completed. Crystalle Popwell, RN, BSN. 

## 2014-01-17 NOTE — Progress Notes (Signed)
Patient ID: [name], female   DOB: 1933/06/30, 80 y.o.   MRN: [id number]    Subjective:  Jittery  Less dyspnic   Objective:  Filed Vitals:   01/17/14 0100 01/17/14 0300 01/17/14 0357 01/17/14 0726  BP: 110/70  103/50 97/64  Pulse: 122 124 108 119  Temp:   98.6 F (37 C) 98.8 F (37.1 C)  TempSrc:   Oral Oral  Resp: 19 16 21 26   Height:      Weight:   233 lb 4 oz (105.8 kg)   SpO2: 99% 98% 97% 94%    Intake/Output from previous day:  Intake/Output Summary (Last 24 hours) at 01/17/14 1045 Last data filed at 01/17/14 1779  Gross per 24 hour  Intake    480 ml  Output   1100 ml  Net   -620 ml    Physical Exam: Anxious  Obese [place] female HEENT: normal Neck supple with no adenopathy JVP normal no bruits no thyromegaly Lungs clear with no wheezing and good diaphragmatic motion Heart:  S1/S2 distant , no rub, gallop or click  Pacer under left clavicle  PMI normal Abdomen: benighn, BS positve, no tenderness, no AAA no bruit.  No HSM or HJR Distal pulses intact with no bruits R TKR  Plus 3 edema bilaterally with brawny induration  Neuro non-focal Skin warm and dry No muscular weakness   Lab Results: Basic Metabolic Panel:  Recent Labs  01/16/14 1314 01/17/14 0306  NA 137 137  K 3.7 4.0  CL 96 96  CO2 25 26  GLUCOSE 91 136*  BUN 36* 36*  CREATININE 2.10* 2.25*  CALCIUM 8.4 8.4   Liver Function Tests: No results found for this basename: AST, ALT, ALKPHOS, BILITOT, PROT, ALBUMIN,  in the last 72 hours No results found for this basename: LIPASE, AMYLASE,  in the last 72 hours CBC:  Recent Labs  01/16/14 1314 01/17/14 0306  WBC 5.6 5.3  HGB 7.8* 7.8*  HCT 25.7* 25.7*  MCV 87.4 88.3  PLT 235 249   Cardiac Enzymes:  Recent Labs  01/16/14 2040 01/17/14 0306 01/17/14 0855  TROPONINI <0.30 <0.30 <0.30   Thyroid Function Tests:  Recent Labs  01/17/14 0306  TSH 0.653    Imaging: Dg Chest Port 1 View  01/16/2014   CLINICAL DATA:   Weight gain.  Shortness of breath.  EXAM: PORTABLE CHEST - 1 VIEW  COMPARISON:  01/16/2013.  FINDINGS: Cardiomegaly with pulmonary vascular prominence and interstitial prominence with small right pleural effusion noted. He has fine consistent with congestive heart failure. Cardiac pacer with lead tips in right atrium right ventricle. No pneumothorax. No acute boney abnormality.  IMPRESSION: Congestive heart failure or interstitial edema.   Electronically Signed   By: Marcello Moores  Register   On: 01/16/2014 13:51    Cardiac Studies:  ECG:  ? Pacer mediated tachycardia    Telemetry:  Currently AV pacing   Echo:   Medications:   . amiodarone  100 mg Oral Daily  . amitriptyline  75 mg Oral QHS  . atorvastatin  40 mg Oral QPM  . benzonatate  200 mg Oral TID  . diltiazem  240 mg Oral Daily  . donepezil  10 mg Oral QHS  . ezetimibe  10 mg Oral QPM  . insulin aspart  0-15 Units Subcutaneous TID WC  . insulin aspart  0-5 Units Subcutaneous QHS  . insulin glargine  40 Units Subcutaneous BID  . levothyroxine  100 mcg Oral q morning -  10a  . metoprolol succinate  50 mg Oral Daily  . pantoprazole  40 mg Oral Daily  . potassium chloride SA  20 mEq Oral Daily  . sodium chloride  3 mL Intravenous Q12H       Assessment/Plan:  CHF:  Diuresing with high dose lasix  Will write for 120 iv bid with addition of zaroxyln  Will have to watch Cr.  BP likely too low  If Cr higher in am or poor diuresis would start dopamine as  Inotrope.  EF 40-45% by last echo  Rhythm:  Currently AV pacing Asked EP to see regarding admitting ECG and arrhythmia with rapid pacing Discussed with Amber Sellers and Dr Rayann Heman to see  Only on amiodarone 100 mg daily DM:  SS insulin Chol:  On statin   Jenkins Rouge 01/17/2014, 10:45 AM

## 2014-01-17 NOTE — Progress Notes (Signed)
Subjective: Admitted July 16 c Vol Overload, CHF Rxed c IV Lasix. Also Sxatic Anemia - - No evidence acute GIB - Will transfuse 1 Unit PRBC NonProductive Cough - CXR C/w CHF C/o back pains and wants to get to chair. Some decrease in Edema. No Resp issues. HR 120's  Objective: Vital signs in last 24 hours: Temp:  [97.4 F (36.3 C)-98.6 F (37 C)] 98.6 F (37 C) (07/17 0357) Pulse Rate:  [32-125] 108 (07/17 0357) Resp:  [16-24] 21 (07/17 0357) BP: (93-135)/(50-81) 103/50 mmHg (07/17 0357) SpO2:  [92 %-100 %] 97 % (07/17 0357) Weight:  [105.8 kg (233 lb 4 oz)-105.87 kg (233 lb 6.4 oz)] 105.8 kg (233 lb 4 oz) (07/17 0357) Weight change:  Last BM Date: 01/16/14  CBG (last 3)   Recent Labs  01/17/14 0008  GLUCAP 120*    Intake/Output from previous day:  Intake/Output Summary (Last 24 hours) at 01/17/14 0617 Last data filed at 01/17/14 5643  Gross per 24 hour  Intake    480 ml  Output    700 ml  Net   -220 ml   07/16 0701 - 07/17 0700 In: 480 [P.O.:480] Out: 700 [Urine:700]   Physical Exam  General appearance: A and O x 2 Eyes: no scleral icterus Throat: oropharynx moist without erythema Resp: Decrease at bases.  Dry cough Cardio: Tachy 120 GI: Obese, soft, non-tender; bowel sounds normal; no masses,  no organomegaly Extremities: 1+ Edema  Lab Results:  Recent Labs  01/16/14 1314 01/17/14 0306  NA 137 137  K 3.7 4.0  CL 96 96  CO2 25 26  GLUCOSE 91 136*  BUN 36* 36*  CREATININE 2.10* 2.25*  CALCIUM 8.4 8.4    No results found for this basename: AST, ALT, ALKPHOS, BILITOT, PROT, ALBUMIN,  in the last 72 hours   Recent Labs  01/16/14 1314 01/17/14 0306  WBC 5.6 5.3  HGB 7.8* 7.8*  HCT 25.7* 25.7*  MCV 87.4 88.3  PLT 235 249    Lab Results  Component Value Date   INR 2.69* 01/16/2014   INR 3.1 01/14/2014   INR 1.8 12/27/2013     Recent Labs  01/16/14 2040 01/17/14 0306  TROPONINI <0.30 <0.30     Recent Labs  01/17/14 0306  TSH  0.653    No results found for this basename: VITAMINB12, FOLATE, FERRITIN, TIBC, IRON, RETICCTPCT,  in the last 72 hours  Micro Results: Recent Results (from the past 240 hour(s))  MRSA PCR SCREENING     Status: None   Collection Time    01/16/14  9:59 PM      Result Value Ref Range Status   MRSA by PCR NEGATIVE  NEGATIVE Final   Comment:            The GeneXpert MRSA Assay (FDA     approved for NASAL specimens     only), is one component of a     comprehensive MRSA colonization     surveillance program. It is not     intended to diagnose MRSA     infection nor to guide or     monitor treatment for     MRSA infections.     Studies/Results: Dg Chest Port 1 View  01/16/2014   CLINICAL DATA:  Weight gain.  Shortness of breath.  EXAM: PORTABLE CHEST - 1 VIEW  COMPARISON:  01/16/2013.  FINDINGS: Cardiomegaly with pulmonary vascular prominence and interstitial prominence with small right pleural effusion noted. He  has fine consistent with congestive heart failure. Cardiac pacer with lead tips in right atrium right ventricle. No pneumothorax. No acute boney abnormality.  IMPRESSION: Congestive heart failure or interstitial edema.   Electronically Signed   By: Marcello Moores  Register   On: 01/16/2014 13:51     Medications: Scheduled: . amiodarone  100 mg Oral Daily  . amitriptyline  75 mg Oral QHS  . atorvastatin  40 mg Oral QPM  . benzonatate  200 mg Oral TID  . diltiazem  240 mg Oral Daily  . donepezil  10 mg Oral QHS  . ezetimibe  10 mg Oral QPM  . furosemide  120 mg Intravenous Once  . insulin aspart  0-15 Units Subcutaneous TID WC  . insulin aspart  0-5 Units Subcutaneous QHS  . insulin glargine  40 Units Subcutaneous BID  . levothyroxine  100 mcg Oral q morning - 10a  . metoprolol succinate  50 mg Oral Daily  . pantoprazole  40 mg Oral Daily  . potassium chloride SA  20 mEq Oral Daily  . sodium chloride  3 mL Intravenous Q12H   Continuous:    Assessment/Plan: Active  Problems:   Atrial fibrillation   Chronic anticoagulation   GI bleed   CKD (chronic kidney disease) stage 3, GFR 30-59 ml/min   Acute on chronic combined systolic and diastolic congestive heart failure, NYHA class 4   Coronary artery disease   Hx of CABG   Pacemaker   CHF (congestive heart failure)  CHF (congestive heart failure) - Acute on chronic CHF - Vol Overload - Getting Sig Diuresis.  ? Dobutamine/Milrinone?   Afib/CHF/Pacer - biventricular pacemaker/CAD/3v CABG/Prior MI. Per cards. On meds. Device to be interogated  AFlutter - Amio  CKDz stage 4 c baseline Crs 1.8 - 2.1.  Follow Cr c IV Lasix usage.  Now up to 2.25.  May need Renal Consult if continue to go up.   Dementia - Stable. Last MMSE 25/30.  Remains on Donepezil 10   Chronic Anticoagulation - Goal INR 2.0 - 2.5. On Hold until Hbg better.  Follow INRs.   Worsening Anemia c hbg 7.8 and stable no evidence of sig bleeed - Transfusion 1 unit today and maybe one more tomorrow. She is UTD on scopes and despite Heme + she is not acting like an acute GI Bleed.  Will follow. Probably will not need GI W/up   AFTT - Chronic and family helps in her care.  get PT and OT.  Morbid Obesity. Desperately needs weight loss.  Prior CVA 04/2003. RF Reduce.  GERD - Stay On PPI  Lipids - Lipitor and Zetia  HTN - BP fine.  Hypothyroid - on meds. TSH P  Will follow along c you and adjust things where needed.   DM2 - Insulin Requiring. Titrate Lantus/Humalog. She usually needs less medication in hospital as I do not believe she does her meds correctly at home.   -  Recent Labs  01/17/14 0008  GLUCAP 120*      LOS: 1 day   Marylou Wages M 01/17/2014, 6:17 AM

## 2014-01-17 NOTE — Progress Notes (Signed)
Patient c/o of cough with no relief from cough syrup. MD made aware.  Lasix x1 ordered.  RN will continue to monitor. Shellee Milo, RN

## 2014-01-17 NOTE — Consult Note (Signed)
ELECTROPHYSIOLOGY CONSULT NOTE    Patient ID: LAKAYA TOLEN MRN: [id number], DOB/AGE: [birth year] 80 y.o.  Admit date: 01/16/2014 Date of Consult: 01-17-14  Primary Physician: [name], MD Primary Cardiologist: [place] Electrophysiologist: Lovena Le  Reason for Consultation: atrial arrhythmias  HPI:  [name] is a 80 y.o. female with a past medical history significant for CAD, hypertension, diabetes, hyperlipidemia, prior CVA, heart failure, and atrial fibrillation.  She underwent implantation of a STJ CRTP in 07/2012 with AV node ablation at that time.  Since implant, she has had long periods of sinus rhythm and is functionally improved. She has been maintained on low dose amiodarone.  Earlier this week, she was evaluated in the office for progressive shortness of breath and was found to be volume overloaded.  Her Lasix was increased.  She had progression of her symptoms and came to [facility] for further evaluation on 01-16-14.  Since admission, she has been predominately AV pacing, but has periods of atrial fibrillation as well as atrial tachycardia.  EP has been asked to evaluate for treatment options.   Last echo 06-11-13 demonstrated EF 40-45% (improved from 30% post CRT), mild to moderate AS, LA 51.    Lab work is reviewed - Hgb 7.8, cardiac enzymes negative, creat 2.25, electrolytes, TSH normal.   She states that her shortness of breath is improving.  She has noted some BRBPR (prev GI eval 2013 at time of GIB - diverticular bleeding as well as internal hemorrhoids).  She denies recent fevers, chills, nausea, vomiting.  ROS is otherwise negative.   Past Medical History  Diagnosis Date  . Coronary artery disease     a. Severe two vessel; s/p CABG (LIMA-LAD, SVG-diag, SVG-PDA);  b. 05/2012 NSTEMI in setting of rapid aflutter;  c. 06/2012 Lexiscan cardiolite: small, mild reversible defect in lateral wall->mild ischemia, low-risk->med Rx.  Marland Kitchen Hypertension   . Type II diabetes  mellitus   . Hyperlipidemia   . Diabetic neuropathy   . CVA (cerebral vascular accident) 2004  . Hypothyroidism   . Chronic anemia   . GERD (gastroesophageal reflux disease)   . Arthritis   . Memory loss, short term   . COPD (chronic obstructive pulmonary disease)   . Blood transfusion without reported diagnosis   . Chronic combined systolic and diastolic CHF (congestive heart failure)     a. 07/2012 Echo: EF 30%, Gr II DD, Mild AS/MR, Mod-Sev TR, mild bi-atrial and RV dil, PASP 11mmHg.  . Moderate mitral regurgitation     a. mod by TEE 2012, mild by echo 2013, 07/2012  . Severe tricuspid regurgitation     a. Severe by TEE 2012, mod by echo 2013, mod-sev by echo 07/2012  . Chronic vulvitis 07/1993  . Idiopathic anemia 2006  . Atrial fibrillation     a. Failed amiodarone (prolonged QT), not a candidate for class IC meds due to her CAD. No Multaq due to CHF hx;  b. 07/2012 s/p SJM Anthem RF Bi-V PPM, ser # [id number] and AV node RFCA;  c. chronic coumadin.  . Atrial flutter   . Pacemaker   . GI bleed     a. 10/2011 - Dr Benson Norway did scope which showed hiatal hernia, colon revealed diverticula (diverticular bleed), internal/ext hemorrhoids.     Surgical History:  Past Surgical History  Procedure Laterality Date  . Coronary artery bypass graft  06/2009    X3, LIMA to LAD, SVG to diagonal, SVG to the posterior descending artery.  . Tonsillectomy    .  Knee arthroscopy  08/2001  . Transthoracic echocardiogram  11/12/2010     Ejection fraction felt to be around 50%.  Wall thickness was increased in a pattern of mild LVH.  Moderately dilated left atrium.  Mildly dilated right atrium. Right ventricle was mildly dilated with mildly reduced systolic function  . Cardiac catheterization  06/09/2009    inferior wall hypokinesia with ejection fraction of 55%.  . Abdominal hysterectomy    . Joint replacement    . Tee without cardioversion  05/23/2011    Procedure: TRANSESOPHAGEAL ECHOCARDIOGRAM (TEE);   Surgeon: Lelon Perla, MD;  Location: [place];  Service: Cardiovascular;  Laterality: N/A;  . Incision and drainage abscess  05/07/2012    Procedure: INCISION AND DRAINAGE ABSCESS;  Surgeon: Adin Hector, MD;  Location: [place];  Service: General;  Laterality: N/A;  Groin wound  . Total abdominal hysterectomy  1979  . Breast biopsy  Left  . Bilateral salpingectomy  10/2000  . Breast biopsy  09/18/2009    fibrocystic change  . Coronary artery bypass graft  06/2009  . Pacemaker insertion  08/2012  . Colonoscopy N/A 01/19/2013    Procedure: COLONOSCOPY;  Surgeon: Inda Castle, MD;  Location: [facility];  Service: Endoscopy;  Laterality: N/A;  . Esophagogastroduodenoscopy N/A 01/19/2013    Procedure: ESOPHAGOGASTRODUODENOSCOPY (EGD);  Surgeon: Inda Castle, MD;  Location: [facility];  Service: Endoscopy;  Laterality: N/A;     Prescriptions prior to admission  Medication Sig Dispense Refill  . amiodarone (PACERONE) 200 MG tablet Take 100 mg by mouth daily.      Marland Kitchen amitriptyline (ELAVIL) 25 MG tablet Take 75 mg by mouth at bedtime.        Marland Kitchen atorvastatin (LIPITOR) 40 MG tablet Take 40 mg by mouth every evening.       . diltiazem (CARDIZEM CD) 240 MG 24 hr capsule Take 240 mg by mouth daily.      Marland Kitchen donepezil (ARICEPT) 10 MG tablet Take 10 mg by mouth at bedtime.      Marland Kitchen esomeprazole (NEXIUM) 40 MG capsule Take 40 mg by mouth daily before breakfast.        . ezetimibe (ZETIA) 10 MG tablet Take 10 mg by mouth every evening.       . furosemide (LASIX) 40 MG tablet Take 80 mg by mouth 2 (two) times daily.      . insulin glargine (LANTUS) 100 UNIT/ML injection Inject 50 Units into the skin 2 (two) times daily.       . insulin lispro (HUMALOG) 100 UNIT/ML injection Inject 20-25 Units into the skin 2 (two) times daily as needed for high blood sugar. Home sliding scale If blood sugar is <200 take 20 units If blood sugar is >200 take 25 units If blood sugar is less than 70, don't take any       . levothyroxine (SYNTHROID, LEVOTHROID) 100 MCG tablet Take 100 mcg by mouth every morning.       . metoprolol succinate (TOPROL-XL) 50 MG 24 hr tablet Take 50 mg by mouth daily. Take with or immediately following a meal.      . Multiple Vitamins-Minerals (ICAPS AREDS FORMULA PO) Take 1 tablet by mouth 2 (two) times daily.      . nitroGLYCERIN (NITROSTAT) 0.4 MG SL tablet Place 0.4 mg under the tongue every 5 (five) minutes as needed. For chest pain      . potassium chloride SA (K-DUR,KLOR-CON) 20 MEQ tablet Take 20 mEq by  mouth daily.      Marland Kitchen warfarin (COUMADIN) 5 MG tablet Take 7.5-10 mg by mouth daily. Take 10mg  by mouth on Sunday, Monday, Wednesday, Friday and Saturday. Take 7.5mg  by mouth on Tuesday and Thursday.        Inpatient Medications:  . amiodarone  100 mg Oral Daily  . amitriptyline  75 mg Oral QHS  . atorvastatin  40 mg Oral QPM  . benzonatate  200 mg Oral TID  . diltiazem  240 mg Oral Daily  . donepezil  10 mg Oral QHS  . ezetimibe  10 mg Oral QPM  . furosemide  160 mg Intravenous BID  . insulin aspart  0-15 Units Subcutaneous TID WC  . insulin aspart  0-5 Units Subcutaneous QHS  . insulin glargine  40 Units Subcutaneous BID  . levothyroxine  100 mcg Oral q morning - 10a  . metolazone  5 mg Oral Daily  . metoprolol succinate  50 mg Oral Daily  . pantoprazole  40 mg Oral Daily  . potassium chloride SA  20 mEq Oral Daily  . sodium chloride  3 mL Intravenous Q12H    Allergies:  Allergies  Allergen Reactions  . Betadine [Povidone Iodine] Itching  . Capoten [Captopril] Other (See Comments)    unknown  . Codone [Hydrocodone] Nausea And Vomiting  . Methadone Nausea And Vomiting  . Penicillins Swelling  . Propoxyphene And Methadone     Intolerance to darvocet    History   Social History  . Marital Status: Married    Spouse Name: N/A    Number of Children: 2  . Years of Education: N/A   Occupational History  . teacher    Social History Main Topics  .  Smoking status: Former Smoker    Types: Cigarettes  . Smokeless tobacco: Never Used  . Alcohol Use: No  . Drug Use: No  . Sexual Activity: No     Comment: hysterectomy   Other Topics Concern  . Not on file   Social History Narrative   Lives in [place] with spouse     Family History  Problem Relation Age of Onset  . Colon cancer Mother   . Stroke Mother   . Hypertension Mother   . Cancer Mother     breast  . Cancer Father     colon  . Heart attack Father     x2  . Renal Disease Sister     dialysis-twin sister  . Renal Disease Sister     renal bypass    BP 116/53  Pulse 70  Temp(Src) 97.4 F (36.3 C) (Oral)  Resp 25  Ht 5\' 3"  (1.6 m)  Wt 233 lb 4 oz (105.8 kg)  BMI 41.33 kg/m2  SpO2 94%  Physical Exam: Physical Exam: Filed Vitals:   01/17/14 1245 01/17/14 1248 01/17/14 1348 01/17/14 1448  BP: 88/52 116/53 99/48 110/54  Pulse:   63   Temp:   98.2 F (36.8 C) 98.2 F (36.8 C)  TempSrc:      Resp:   15   Height:      Weight:      SpO2:   95% 95%    GEN- The patient is morbidly obese appearing, alert and oriented x 3 today.   Head- normocephalic, atraumatic Eyes-  Sclera clear, conjunctiva pink Ears- hearing intact Oropharynx- clear Neck- supple Lungs- Clear to ausculation bilaterally  Heart- regular rate and rhythm  GI- soft, NT, ND, + BS Extremities- no clubbing, cyanosis, +  edema  MS- no significant deformity or atrophy Skin- no rash or lesion Psych- euthymic mood, full affect Neuro- strength and sensation are intact  Labs:   Lab Results  Component Value Date   WBC 5.3 01/17/2014   HGB 7.8* 01/17/2014   HCT 25.7* 01/17/2014   MCV 88.3 01/17/2014   PLT 249 01/17/2014    Recent Labs Lab 01/17/14 0306  NA 137  K 4.0  CL 96  CO2 26  BUN 36*  CREATININE 2.25*  CALCIUM 8.4  GLUCOSE 136*    Radiology/Studies: Dg Chest Port 1 View 01/16/2014   CLINICAL DATA:  Weight gain.  Shortness of breath.  EXAM: PORTABLE CHEST - 1 VIEW  COMPARISON:   01/16/2013.  FINDINGS: Cardiomegaly with pulmonary vascular prominence and interstitial prominence with small right pleural effusion noted. He has fine consistent with congestive heart failure. Cardiac pacer with lead tips in right atrium right ventricle. No pneumothorax. No acute boney abnormality.  IMPRESSION: Congestive heart failure or interstitial edema.   Electronically Signed   By: Marcello Moores  Register   On: 01/16/2014 13:51    OZY:YQMGNO fibrillation with ventricular pacing  TELEMETRY: AV pacing with intermittent atach w ventricular tracking  DEVICE HISTORY: STJ CRTP implanted 07-18-12 by Dr Lovena Le - interrogation reviewed today.  See paper chart for full details. Today, MTR decreased to 100bpm.   A/P  1. Persistent afib The patient has a h/o afib for which she is s/p AV nodal ablation and PPM. She has maintained sinus rhythm quite well since 1/15 by PPM interrogation today.  She is admitted with CHF. Her device function is normal.  I have adjusted upper tracking rate (decreased to 100 bpm) to minimize tracking of atrial tachycardia (130 bpm).  Atrial sensitivity and LV pacing output are also adjusted. Anticoagulation is presently on hold due to anemia.  Chad2vasc score is at least 9.  Hopefully anticoagulation can be resumed once anemia source is determined. If she has further afib, amiodarone could be increased to 200mg  daily.  I have made no changes today.  2. Complete heart block As above  3. Anemia Anticoagulation on hold Dr Virgina Jock is following  Electrophysiology team to see as needed while here. Please call with questions.

## 2014-01-17 NOTE — ED Provider Notes (Signed)
Medical screening examination/treatment/procedure(s) were conducted as a shared visit with non-physician practitioner(s) and myself.  I personally evaluated the patient during the encounter.  Worsening SOB, DOE, orthopnea, PND, weight gain.  Recent increase of lasix. No chest pain. Bibasilar rales with pitting edema. CHF exacerbation with worsening anemia, FOBT + stools.  Soft BP. Creatinine at baseline. Needs diuresis and likely transfusion.  Possible dobutamine if BP remains soft.  CRITICAL CARE Performed by: Ezequiel Essex Total critical care time: 30 Critical care time was exclusive of separately billable procedures and treating other patients. Critical care was necessary to treat or prevent imminent or life-threatening deterioration. Critical care was time spent personally by me on the following activities: development of treatment plan with patient and/or surrogate as well as nursing, discussions with consultants, evaluation of patient's response to treatment, examination of patient, obtaining history from patient or surrogate, ordering and performing treatments and interventions, ordering and review of laboratory studies, ordering and review of radiographic studies, pulse oximetry and re-evaluation of patient's condition.    EKG Interpretation   Date/Time:  Thursday January 16 2014 12:17:18 EDT Ventricular Rate:  98 PR Interval:    QRS Duration: 134 QT Interval:  386 QTC Calculation: 492 R Axis:   144 Text Interpretation:  Electronic ventricular pacemaker No significant  change was found Confirmed by Wyvonnia Dusky  MD, Rheannon Cerney [phone]) on 01/16/2014  1:18:35 PM       Ezequiel Essex, MD 01/17/14 [phone]

## 2014-01-18 DIAGNOSIS — N189 Chronic kidney disease, unspecified: Secondary | ICD-10-CM

## 2014-01-18 DIAGNOSIS — N179 Acute kidney failure, unspecified: Secondary | ICD-10-CM

## 2014-01-18 DIAGNOSIS — I4891 Unspecified atrial fibrillation: Secondary | ICD-10-CM

## 2014-01-18 LAB — BASIC METABOLIC PANEL
ANION GAP: 17 — AB (ref 5–15)
BUN: 44 mg/dL — ABNORMAL HIGH (ref 6–23)
CO2: 24 meq/L (ref 19–32)
Calcium: 8.6 mg/dL (ref 8.4–10.5)
Chloride: 91 mEq/L — ABNORMAL LOW (ref 96–112)
Creatinine, Ser: 2.57 mg/dL — ABNORMAL HIGH (ref 0.50–1.10)
GFR calc Af Amer: 19 mL/min — ABNORMAL LOW (ref >90)
GFR calc non Af Amer: 17 mL/min — ABNORMAL LOW (ref >90)
GLUCOSE: 122 mg/dL — AB (ref 70–99)
Potassium: 3.7 mEq/L (ref 3.7–5.3)
SODIUM: 132 meq/L — AB (ref 137–147)

## 2014-01-18 LAB — CBC
HEMATOCRIT: 29.5 % — AB (ref 36.0–46.0)
Hemoglobin: 9.1 g/dL — ABNORMAL LOW (ref 12.0–15.0)
MCH: 27.1 pg (ref 26.0–34.0)
MCHC: 30.8 g/dL (ref 30.0–36.0)
MCV: 87.8 fL (ref 78.0–100.0)
PLATELETS: 291 10*3/uL (ref 150–400)
RBC: 3.36 MIL/uL — AB (ref 3.87–5.11)
RDW: 17 % — ABNORMAL HIGH (ref 11.5–15.5)
WBC: 6.3 10*3/uL (ref 4.0–10.5)

## 2014-01-18 LAB — GLUCOSE, CAPILLARY
GLUCOSE-CAPILLARY: 112 mg/dL — AB (ref 70–99)
GLUCOSE-CAPILLARY: 236 mg/dL — AB (ref 70–99)
Glucose-Capillary: 184 mg/dL — ABNORMAL HIGH (ref 70–99)
Glucose-Capillary: 180 mg/dL — ABNORMAL HIGH (ref 70–99)

## 2014-01-18 LAB — PROTIME-INR
INR: 3.19 — ABNORMAL HIGH (ref 0.00–1.49)
Prothrombin Time: 32.7 seconds — ABNORMAL HIGH (ref 11.6–15.2)

## 2014-01-18 MED ORDER — FUROSEMIDE 10 MG/ML IJ SOLN
160.0000 mg | Freq: Every day | INTRAVENOUS | Status: DC
  Administered 2014-01-19: 160 mg via INTRAVENOUS
  Filled 2014-01-18: qty 16

## 2014-01-18 MED ORDER — SODIUM CHLORIDE 0.9 % IV SOLN
510.0000 mg | Freq: Once | INTRAVENOUS | Status: AC
  Administered 2014-01-18: 510 mg via INTRAVENOUS
  Filled 2014-01-18: qty 17

## 2014-01-18 NOTE — Progress Notes (Signed)
Subjective: Admitted July 16 c Vol Overload, CHF Rxed c IV Lasix, Metolazone and reprogramming Pacer Also Sxatic Anemia - - No evidence acute GIB - S/P 1 Unit PRBC NonProductive Cough - CXR C/w CHF already improving. She has been sleeping better. Her volume and weight are down No Resp issues. HR 70's and she clearly is doing better - but cr is going up.  Objective: Vital signs in last 24 hours: Temp:  [97.4 F (36.3 C)-98.5 F (36.9 C)] 97.5 F (36.4 C) (07/18 0821) Pulse Rate:  [63-74] 70 (07/18 0821) Resp:  [15-25] 19 (07/18 0821) BP: (88-128)/(45-58) 128/58 mmHg (07/18 0821) SpO2:  [93 %-98 %] 95 % (07/18 0821) Weight change:  Last BM Date: 01/17/14  CBG (last 3)   Recent Labs  01/17/14 0729 01/17/14 1247 01/17/14 2122  GLUCAP 123* 155* 207*    Intake/Output from previous day:  Intake/Output Summary (Last 24 hours) at 01/18/14 0855 Last data filed at 01/17/14 1600  Gross per 24 hour  Intake 1150.5 ml  Output    200 ml  Net  950.5 ml   07/17 0701 - 07/18 0700 In: 1150.5 [P.O.:440; I.V.:250; Blood:460.5] Out: 600 [Urine:600]   Physical Exam  General appearance: A and O x 2 Eyes: no scleral icterus Throat: oropharynx moist without erythema Resp: Decrease at bases.  Dry cough Cardio: Tachy 120 GI: Obese, soft, non-tender; bowel sounds normal; no masses,  no organomegaly Extremities: trace Edema  Lab Results:  Recent Labs  01/17/14 0306 01/18/14 0240  NA 137 132*  K 4.0 3.7  CL 96 91*  CO2 26 24  GLUCOSE 136* 122*  BUN 36* 44*  CREATININE 2.25* 2.57*  CALCIUM 8.4 8.6    No results found for this basename: AST, ALT, ALKPHOS, BILITOT, PROT, ALBUMIN,  in the last 72 hours   Recent Labs  01/17/14 0306 01/18/14 0240  WBC 5.3 6.3  HGB 7.8* 9.1*  HCT 25.7* 29.5*  MCV 88.3 87.8  PLT 249 291    Lab Results  Component Value Date   INR 3.19* 01/18/2014   INR 3.12* 01/17/2014   INR 2.69* 01/16/2014     Recent Labs  01/16/14 2040  01/17/14 0306 01/17/14 0855  TROPONINI <0.30 <0.30 <0.30     Recent Labs  01/17/14 0306  TSH 0.653     Recent Labs  01/17/14 0306  TIBC 353  IRON 19*    Micro Results: Recent Results (from the past 240 hour(s))  MRSA PCR SCREENING     Status: None   Collection Time    01/16/14  9:59 PM      Result Value Ref Range Status   MRSA by PCR NEGATIVE  NEGATIVE Final   Comment:            The GeneXpert MRSA Assay (FDA     approved for NASAL specimens     only), is one component of a     comprehensive MRSA colonization     surveillance program. It is not     intended to diagnose MRSA     infection nor to guide or     monitor treatment for     MRSA infections.     Studies/Results: Dg Chest Port 1 View  01/16/2014   CLINICAL DATA:  Weight gain.  Shortness of breath.  EXAM: PORTABLE CHEST - 1 VIEW  COMPARISON:  01/16/2013.  FINDINGS: Cardiomegaly with pulmonary vascular prominence and interstitial prominence with small right pleural effusion noted. He has fine consistent  with congestive heart failure. Cardiac pacer with lead tips in right atrium right ventricle. No pneumothorax. No acute boney abnormality.  IMPRESSION: Congestive heart failure or interstitial edema.   Electronically Signed   By: Marcello Moores  Register   On: 01/16/2014 13:51     Medications: Scheduled: . amiodarone  100 mg Oral Daily  . amitriptyline  75 mg Oral QHS  . atorvastatin  40 mg Oral QPM  . benzonatate  200 mg Oral TID  . diltiazem  240 mg Oral Daily  . donepezil  10 mg Oral QHS  . ezetimibe  10 mg Oral QPM  . furosemide  160 mg Intravenous BID  . insulin aspart  0-15 Units Subcutaneous TID WC  . insulin aspart  0-5 Units Subcutaneous QHS  . insulin glargine  40 Units Subcutaneous BID  . levothyroxine  100 mcg Oral q morning - 10a  . metolazone  5 mg Oral Daily  . metoprolol succinate  50 mg Oral Daily  . pantoprazole  40 mg Oral Daily  . potassium chloride SA  20 mEq Oral Daily  . sodium  chloride  3 mL Intravenous Q12H   Continuous:    Assessment/Plan: Active Problems:   Atrial fibrillation   Chronic anticoagulation   GI bleed   CKD (chronic kidney disease) stage 3, GFR 30-59 ml/min   Acute on chronic combined systolic and diastolic congestive heart failure, NYHA class 4   Coronary artery disease   Hx of CABG   Pacemaker   CHF (congestive heart failure)  CHF (congestive heart failure) - Acute on chronic CHF - Vol Overload - Getting Sig Diuresis. May need to back off and return to PO diuretics as cr going up  Afib/CHF/Pacer - biventricular pacemaker/CAD/3v CABG/Prior MI. Per cards. On meds. Device interogated and Dr Joylene Grapes adjusted settings.  AFlutter - Amio   CKDz stage 4 c baseline Crs 1.8 - 2.1.  Follow Cr c IV Lasix usage.  Now up to 2.25-2.57.  May need Renal Consult if continue to go up. Needs to start backing off.   Dementia - Stable. Last MMSE 25/30.  Remains on Donepezil 10   Chronic Anticoagulation - Goal INR 2.0 - 2.5. On Hold until Hbg better.  Follow INRs. INR @ 3.19 now. Pharm to manage  Worsening Anemia c hbg 7.8 and stable no evidence of sig bleeed - S/P 1 unit PRBC c appropriate rise in Hbg. She is UTD on scopes and despite Heme + she is not acting like an acute GI Bleed.  Will follow. Probably will not need GI W/up.  May need more blood if Sxatic She is iron def and will get Feraheme 510 today. May allow Aranesp per Heme to work better   AFTT - Chronic and family helps in her care.  get PT and OT. Ordered.  May move to floor soon Morbid Obesity. Desperately needs weight loss.  Prior CVA 04/2003. RF Reduce.  GERD - Stay On PPI  Lipids - Lipitor and Zetia  HTN - BP fine.  Hypothyroid - on meds. TSH 0.653 Will follow along c you and adjust things where needed.   DM2 - Insulin Requiring. Titrate Lantus/Humalog. She usually needs less medication in hospital as I do not believe she does her meds correctly at home. CBGs are OK.  -   Recent  Labs  01/17/14 0729 01/17/14 1247 01/17/14 2122  GLUCAP 123* 155* 207*      LOS: 2 days   [name] 01/18/2014, 8:55 AM

## 2014-01-18 NOTE — Evaluation (Signed)
Physical Therapy Evaluation Patient Details Name: [name] MRN: [id number] DOB: 07-Aug-1932 Today's Date: 01/18/2014   History of Present Illness  Patient is a 80 yo female admitted 01/16/14 with increased SOB, orthopnea, chest pressure, marked increase in edema and weight gain. She was felt to be in a/c CHF. EKG also demonstrated atrial fibrillation with a ventricular response of 94 bpm.   Patient with h/o CAD, CABG, cardiomyopathy, Afib, pacemaker, DM, CHF, HTN, neuropathy, CVA, COPD.  Clinical Impression  Patient presents with problems listed below.  Will benefit from acute PT to maximize independence prior to return home with husband.  Patient unsteady in standing.  Recommend use of RW for OOB/gait.  Recommend HHPT for continued therapy for balance/mobility at discharge.    Follow Up Recommendations Home health PT;Supervision/Assistance - 24 hour    Equipment Recommendations  None recommended by PT    Recommendations for Other Services       Precautions / Restrictions Precautions Precautions: Fall Restrictions Weight Bearing Restrictions: No      Mobility  Bed Mobility Overal bed mobility: Needs Assistance Bed Mobility: Rolling;Sidelying to Sit Rolling: Min guard (use of rail) Sidelying to sit: Min assist       General bed mobility comments: Verbal cues for technique.  Assist to raise trunk to sitting position.  Once upright, patient with good balance.  Transfers Overall transfer level: Needs assistance Equipment used: 2 person hand held assist Transfers: Sit to/from [facility] Sit to Stand: Min assist;+2 physical assistance Stand pivot transfers: Min assist;+2 physical assistance       General transfer comment: Verbal cues for technique.  Assist for balance/safety with transfers.  Unsteady in standing.  Ambulation/Gait                Stairs            Wheelchair Mobility    Modified Rankin (Stroke Patients Only)        Balance Overall balance assessment: Needs assistance Sitting-balance support: No upper extremity supported;Feet supported Sitting balance-Leahy Scale: Good     Standing balance support: Single extremity supported Standing balance-Leahy Scale: Poor                               Pertinent Vitals/Pain     Home Living Family/patient expects to be discharged to:: Private residence Living Arrangements: Spouse/significant other Available Help at Discharge: Family;Available 24 hours/day Type of Home: House Home Access: Stairs to enter Entrance Stairs-Rails: Psychiatric nurse of Steps: 3 Home Layout: One level Home Equipment: Walker - 2 wheels;Cane - single point;Bedside commode;Shower seat      Prior Function Level of Independence: Independent               Hand Dominance        Extremity/Trunk Assessment   Upper Extremity Assessment: Overall WFL for tasks assessed           Lower Extremity Assessment: Generalized weakness         Communication   Communication: No difficulties  Cognition Arousal/Alertness: Awake/alert Behavior During Therapy: WFL for tasks assessed/performed Overall Cognitive Status: Within Functional Limits for tasks assessed                      General Comments      Exercises General Exercises - Lower Extremity Ankle Circles/Pumps: AROM;Both;10 reps;Seated      Assessment/Plan    PT Assessment Patient  needs continued PT services  PT Diagnosis Difficulty walking;Generalized weakness   PT Problem List Decreased strength;Decreased activity tolerance;Decreased balance;Decreased mobility;Decreased knowledge of use of DME;Cardiopulmonary status limiting activity;Obesity  PT Treatment Interventions DME instruction;Gait training;Stair training;Functional mobility training;Therapeutic exercise;Patient/family education   PT Goals (Current goals can be found in the Care Plan section) Acute Rehab PT  Goals Patient Stated Goal: To get stronger PT Goal Formulation: With patient Time For Goal Achievement: 01/25/14 Potential to Achieve Goals: Good    Frequency Min 3X/week   Barriers to discharge        Co-evaluation               End of Session Equipment Utilized During Treatment: Gait belt Activity Tolerance: Patient limited by fatigue Patient left: in chair;with call bell/phone within reach Nurse Communication: Mobility status         Time: 0815-0827 PT Time Calculation (min): 12 min   Charges:   PT Evaluation $Initial PT Evaluation Tier I: 1 Procedure PT Treatments $Therapeutic Activity: 8-22 mins   PT G Codes:          Despina Pole 01/18/2014, 9:13 AM Carita Pian. Sanjuana Kava, [place] Pager [phone]

## 2014-01-18 NOTE — Progress Notes (Signed)
Subjective: No Cp  Breathing is a little better Objective: Filed Vitals:   01/17/14 2000 01/18/14 0332 01/18/14 0745 01/18/14 0821  BP:  120/52  128/58  Pulse:  71  70  Temp: 97.9 F (36.6 C) 98.5 F (36.9 C)  97.5 F (36.4 C)  TempSrc: Oral Oral  Oral  Resp:  20  19  Height:      Weight:      SpO2:  94% 93% 95%   Weight change:   Intake/Output Summary (Last 24 hours) at 01/18/14 1203 Last data filed at 01/18/14 1041  Gross per 24 hour  Intake 1060.5 ml  Output    200 ml  Net  860.5 ml    General: Alert, awake, oriented x3, in no acute distress Neck:  JVP is normal Heart: Regular rate and rhythm, without murmurs, rubs, gallops.  Lungs: Clear to auscultation.  No rales or wheezes. Exemities:  No edema.   Neuro: Grossly intact, nonfocal.   Lab Results: Results for orders placed during the hospital encounter of 01/16/14 (from the past 24 hour(s))  GLUCOSE, CAPILLARY     Status: Abnormal   Collection Time    01/17/14 12:47 PM      Result Value Ref Range   Glucose-Capillary 155 (*) 70 - 99 mg/dL  GLUCOSE, CAPILLARY     Status: Abnormal   Collection Time    01/17/14  9:22 PM      Result Value Ref Range   Glucose-Capillary 207 (*) 70 - 99 mg/dL  BASIC METABOLIC PANEL     Status: Abnormal   Collection Time    01/18/14  2:40 AM      Result Value Ref Range   Sodium 132 (*) 137 - 147 mEq/L   Potassium 3.7  3.7 - 5.3 mEq/L   Chloride 91 (*) 96 - 112 mEq/L   CO2 24  19 - 32 mEq/L   Glucose, Bld 122 (*) 70 - 99 mg/dL   BUN 44 (*) 6 - 23 mg/dL   Creatinine, Ser 2.57 (*) 0.50 - 1.10 mg/dL   Calcium 8.6  8.4 - 10.5 mg/dL   GFR calc non Af Amer 17 (*) >90 mL/min   GFR calc Af Amer 19 (*) >90 mL/min   Anion gap 17 (*) 5 - 15  CBC     Status: Abnormal   Collection Time    01/18/14  2:40 AM      Result Value Ref Range   WBC 6.3  4.0 - 10.5 K/uL   RBC 3.36 (*) 3.87 - 5.11 MIL/uL   Hemoglobin 9.1 (*) 12.0 - 15.0 g/dL   HCT 29.5 (*) 36.0 - 46.0 %   MCV 87.8  78.0 - 100.0  fL   MCH 27.1  26.0 - 34.0 pg   MCHC 30.8  30.0 - 36.0 g/dL   RDW 17.0 (*) 11.5 - 15.5 %   Platelets 291  150 - 400 K/uL  PROTIME-INR     Status: Abnormal   Collection Time    01/18/14  2:40 AM      Result Value Ref Range   Prothrombin Time 32.7 (*) 11.6 - 15.2 seconds   INR 3.19 (*) 0.00 - 1.49  GLUCOSE, CAPILLARY     Status: Abnormal   Collection Time    01/18/14  8:46 AM      Result Value Ref Range   Glucose-Capillary 112 (*) 70 - 99 mg/dL    Studies/Results: No results found.  Medications: Reviewed   @  PROBHOSP@  1.  Acute on chronic systolic CHF  Patient still with increased volume on exam. Cr has increased  Will hold Zaroxyln and hold PM dose of IV lasix Check in AM  2.  Rhythm  S/p AV node ablation and PPM  In SR.   Remains on coumadin  Need to get to lower end of therapeutic  If further afib would increase amio to 200    3  DM  SS insulin  4.  Chol  Statin  5.  Anemia  Follow  S/p transfusion   Dorris Carnes 01/18/2014, 12:03 PM

## 2014-01-18 NOTE — Progress Notes (Signed)
Pt alert and oriented x4 but is impulsive at times.  Reminded routinely to utilize call light when she needs to stand or for any assistance - pt verbalizes understanding and demonstrates how to use the call light appropriately but has attempted to get up on her own to  Use the [facility] without assistance.  Chair alarm in place and pt educated again about the importance of safety and not getting up without assistance.  No c/o pain or discomfort, states she "slept like a lamb last night" and that she's "feeling so much better".  Will continue to closely monitor.

## 2014-01-18 NOTE — Progress Notes (Addendum)
ANTICOAGULATION CONSULT NOTE - Initial Consult  Pharmacy Consult for Coumadin Indication: atrial fibrillation  Allergies  Allergen Reactions  . Betadine [Povidone Iodine] Itching  . Capoten [Captopril] Other (See Comments)    unknown  . Codone [Hydrocodone] Nausea And Vomiting  . Methadone Nausea And Vomiting  . Penicillins Swelling  . Propoxyphene And Methadone     Intolerance to darvocet    Patient Measurements: Height: 5\' 3"  (160 cm) Weight: 233 lb 4 oz (105.8 kg) IBW/kg (Calculated) : 52.4  Vital Signs: Temp: 97.5 F (36.4 C) (07/18 0821) Temp src: Oral (07/18 0821) BP: 128/58 mmHg (07/18 0821) Pulse Rate: 70 (07/18 0821)  Labs:  Recent Labs  01/16/14 1314 01/16/14 2040 01/17/14 0306 01/17/14 0755 01/17/14 0855 01/18/14 0240  HGB 7.8*  --  7.8*  --   --  9.1*  HCT 25.7*  --  25.7*  --   --  29.5*  PLT 235  --  249  --   --  291  LABPROT 28.6*  --   --  32.1*  --  32.7*  INR 2.69*  --   --  3.12*  --  3.19*  CREATININE 2.10*  --  2.25*  --   --  2.57*  TROPONINI  --  <0.30 <0.30  --  <0.30  --     Estimated Creatinine Clearance: 20.7 ml/min (by C-G formula based on Cr of 2.57).   Medical History: Past Medical History  Diagnosis Date  . Coronary artery disease     a. Severe two vessel; s/p CABG (LIMA-LAD, SVG-diag, SVG-PDA);  b. 05/2012 NSTEMI in setting of rapid aflutter;  c. 06/2012 Lexiscan cardiolite: small, mild reversible defect in lateral wall->mild ischemia, low-risk->med Rx.  Marland Kitchen Hypertension   . Type II diabetes mellitus   . Hyperlipidemia   . Diabetic neuropathy   . CVA (cerebral vascular accident) 2004  . Hypothyroidism   . Chronic anemia   . GERD (gastroesophageal reflux disease)   . Arthritis   . Memory loss, short term   . COPD (chronic obstructive pulmonary disease)   . Blood transfusion without reported diagnosis   . Chronic combined systolic and diastolic CHF (congestive heart failure)     a. 07/2012 Echo: EF 30%, Gr II DD, Mild  AS/MR, Mod-Sev TR, mild bi-atrial and RV dil, PASP 40mmHg.  . Moderate mitral regurgitation     a. mod by TEE 2012, mild by echo 2013, 07/2012  . Severe tricuspid regurgitation     a. Severe by TEE 2012, mod by echo 2013, mod-sev by echo 07/2012  . Chronic vulvitis 07/1993  . Idiopathic anemia 2006  . Atrial fibrillation     a. Failed amiodarone (prolonged QT), not a candidate for class IC meds due to her CAD. No Multaq due to CHF hx;  b. 07/2012 s/p SJM Anthem RF Bi-V PPM, ser # [id number] and AV node RFCA;  c. chronic coumadin.  . Atrial flutter   . Pacemaker   . GI bleed     a. 10/2011 - Dr Benson Norway did scope which showed hiatal hernia, colon revealed diverticula (diverticular bleed), internal/ext hemorrhoids.    Medications:  Scheduled:  . amiodarone  100 mg Oral Daily  . amitriptyline  75 mg Oral QHS  . atorvastatin  40 mg Oral QPM  . benzonatate  200 mg Oral TID  . diltiazem  240 mg Oral Daily  . donepezil  10 mg Oral QHS  . ezetimibe  10 mg Oral QPM  .  ferumoxytol  510 mg Intravenous Once  . furosemide  160 mg Intravenous BID  . insulin aspart  0-15 Units Subcutaneous TID WC  . insulin aspart  0-5 Units Subcutaneous QHS  . insulin glargine  40 Units Subcutaneous BID  . levothyroxine  100 mcg Oral q morning - 10a  . metolazone  5 mg Oral Daily  . metoprolol succinate  50 mg Oral Daily  . pantoprazole  40 mg Oral Daily  . potassium chloride SA  20 mEq Oral Daily  . sodium chloride  3 mL Intravenous Q12H    Assessment: 80 yo F presenting on 7/16 with SOB. Patient on warfarin PTA for afib which was on hold for anemia and h/o GIB. Currently no evidence of GIB and H/H has improved after 1 unit PRBC. Iron studies showing iron deficiency to receive Feraheme today.  INR on admit elevated at 2.69 and continues to rise to 3.19 despite last dose on 7/15.   Home dose: 10 mg daily except 7.5 mg on T,T   Goal of Therapy:  INR goal: 2-2.5 Monitor platelets by anticoagulation protocol: Yes    Plan:  - Continue to hold Coumadin tonight - Daily PT/INR - Monitor CBC, s/s bleeding  Harolyn Rutherford, PharmD Clinical Pharmacist - Resident Pager: [phone] Pharmacy: [phone] 01/18/2014 9:34 AM

## 2014-01-19 LAB — CBC
HCT: 33 % — ABNORMAL LOW (ref 36.0–46.0)
HEMATOCRIT: 26.3 % — AB (ref 36.0–46.0)
Hemoglobin: 8.3 g/dL — ABNORMAL LOW (ref 12.0–15.0)
Hemoglobin: 10.3 g/dL — ABNORMAL LOW (ref 12.0–15.0)
MCH: 27.3 pg (ref 26.0–34.0)
MCH: 27.8 pg (ref 26.0–34.0)
MCHC: 31.6 g/dL (ref 30.0–36.0)
MCHC: 31.2 g/dL (ref 30.0–36.0)
MCV: 86.5 fL (ref 78.0–100.0)
MCV: 89.2 fL (ref 78.0–100.0)
PLATELETS: 293 10*3/uL (ref 150–400)
Platelets: 302 10*3/uL (ref 150–400)
RBC: 3.04 MIL/uL — ABNORMAL LOW (ref 3.87–5.11)
RBC: 3.7 MIL/uL — ABNORMAL LOW (ref 3.87–5.11)
RDW: 17.1 % — AB (ref 11.5–15.5)
RDW: 17 % — AB (ref 11.5–15.5)
WBC: 7.5 10*3/uL (ref 4.0–10.5)
WBC: 7.2 10*3/uL (ref 4.0–10.5)

## 2014-01-19 LAB — BASIC METABOLIC PANEL
Anion gap: 16 — ABNORMAL HIGH (ref 5–15)
Anion gap: 16 — ABNORMAL HIGH (ref 5–15)
BUN: 48 mg/dL — ABNORMAL HIGH (ref 6–23)
BUN: 46 mg/dL — AB (ref 6–23)
CALCIUM: 8.3 mg/dL — AB (ref 8.4–10.5)
CALCIUM: 8.6 mg/dL (ref 8.4–10.5)
CHLORIDE: 91 meq/L — AB (ref 96–112)
CO2: 28 mEq/L (ref 19–32)
CO2: 27 meq/L (ref 19–32)
CREATININE: 2.58 mg/dL — AB (ref 0.50–1.10)
Chloride: 90 mEq/L — ABNORMAL LOW (ref 96–112)
Creatinine, Ser: 2.66 mg/dL — ABNORMAL HIGH (ref 0.50–1.10)
GFR calc Af Amer: 19 mL/min — ABNORMAL LOW (ref >90)
GFR calc Af Amer: 19 mL/min — ABNORMAL LOW (ref >90)
GFR calc non Af Amer: 16 mL/min — ABNORMAL LOW (ref >90)
GFR calc non Af Amer: 17 mL/min — ABNORMAL LOW (ref >90)
GLUCOSE: 125 mg/dL — AB (ref 70–99)
GLUCOSE: 180 mg/dL — AB (ref 70–99)
Potassium: 2.8 mEq/L — CL (ref 3.7–5.3)
Potassium: 3.6 mEq/L — ABNORMAL LOW (ref 3.7–5.3)
SODIUM: 134 meq/L — AB (ref 137–147)
Sodium: 134 mEq/L — ABNORMAL LOW (ref 137–147)

## 2014-01-19 LAB — PREPARE RBC (CROSSMATCH)

## 2014-01-19 LAB — PROTIME-INR
INR: 2.24 — ABNORMAL HIGH (ref 0.00–1.49)
Prothrombin Time: 24.8 seconds — ABNORMAL HIGH (ref 11.6–15.2)

## 2014-01-19 LAB — GLUCOSE, CAPILLARY
GLUCOSE-CAPILLARY: 166 mg/dL — AB (ref 70–99)
Glucose-Capillary: 51 mg/dL — ABNORMAL LOW (ref 70–99)
Glucose-Capillary: 93 mg/dL (ref 70–99)
Glucose-Capillary: 111 mg/dL — ABNORMAL HIGH (ref 70–99)
Glucose-Capillary: 270 mg/dL — ABNORMAL HIGH (ref 70–99)

## 2014-01-19 MED ORDER — POTASSIUM CHLORIDE CRYS ER 20 MEQ PO TBCR
60.0000 meq | EXTENDED_RELEASE_TABLET | Freq: Once | ORAL | Status: AC
  Administered 2014-01-19: 60 meq via ORAL
  Filled 2014-01-19: qty 3

## 2014-01-19 MED ORDER — INSULIN GLARGINE 100 UNIT/ML ~~LOC~~ SOLN
37.0000 [IU] | Freq: Two times a day (BID) | SUBCUTANEOUS | Status: DC
  Administered 2014-01-19: 37 [IU] via SUBCUTANEOUS
  Filled 2014-01-19 (×2): qty 0.37

## 2014-01-19 MED ORDER — FUROSEMIDE 20 MG PO TABS
20.0000 mg | ORAL_TABLET | Freq: Two times a day (BID) | ORAL | Status: DC
  Filled 2014-01-19 (×3): qty 1

## 2014-01-19 NOTE — Progress Notes (Addendum)
Subjective: Patient very sleepy  No CP  Complains of no SOB   Objective: Filed Vitals:   01/19/14 0405 01/19/14 0620 01/19/14 0800 01/19/14 0932  BP: 135/38 136/44  115/79  Pulse: 71 70  89  Temp: 98.5 F (36.9 C) 98.8 F (37.1 C) 97.6 F (36.4 C)   TempSrc: Oral Oral Oral   Resp: 22 20    Height:      Weight: 233 lb 4 oz (105.8 kg)     SpO2: 92% 93%     Weight change:   Intake/Output Summary (Last 24 hours) at 01/19/14 1042 Last data filed at 01/19/14 0500  Gross per 24 hour  Intake    110 ml  Output   2850 ml  Net  -2740 ml   Net neg 2.26 L  General: Alert, awake, oriented x3, in no acute distress Neck:  JVP is normal Heart: Regular rate and rhythm, without murmurs, rubs, gallops.  Lungs: Coarse BS   Exemities:  1+  edema.   Neuro: Grossly intact, nonfocal.  Sleepy   Lab Results: Results for orders placed during the hospital encounter of 01/16/14 (from the past 24 hour(s))  GLUCOSE, CAPILLARY     Status: Abnormal   Collection Time    01/18/14 12:23 PM      Result Value Ref Range   Glucose-Capillary 184 (*) 70 - 99 mg/dL  GLUCOSE, CAPILLARY     Status: Abnormal   Collection Time    01/18/14  4:21 PM      Result Value Ref Range   Glucose-Capillary 236 (*) 70 - 99 mg/dL  GLUCOSE, CAPILLARY     Status: Abnormal   Collection Time    01/18/14  9:27 PM      Result Value Ref Range   Glucose-Capillary 180 (*) 70 - 99 mg/dL  BASIC METABOLIC PANEL     Status: Abnormal   Collection Time    01/19/14  2:30 AM      Result Value Ref Range   Sodium 134 (*) 137 - 147 mEq/L   Potassium 2.8 (*) 3.7 - 5.3 mEq/L   Chloride 90 (*) 96 - 112 mEq/L   CO2 28  19 - 32 mEq/L   Glucose, Bld 125 (*) 70 - 99 mg/dL   BUN 48 (*) 6 - 23 mg/dL   Creatinine, Ser 2.66 (*) 0.50 - 1.10 mg/dL   Calcium 8.3 (*) 8.4 - 10.5 mg/dL   GFR calc non Af Amer 16 (*) >90 mL/min   GFR calc Af Amer 19 (*) >90 mL/min   Anion gap 16 (*) 5 - 15  CBC     Status: Abnormal   Collection Time    01/19/14   2:30 AM      Result Value Ref Range   WBC 7.5  4.0 - 10.5 K/uL   RBC 3.04 (*) 3.87 - 5.11 MIL/uL   Hemoglobin 8.3 (*) 12.0 - 15.0 g/dL   HCT 26.3 (*) 36.0 - 46.0 %   MCV 86.5  78.0 - 100.0 fL   MCH 27.3  26.0 - 34.0 pg   MCHC 31.6  30.0 - 36.0 g/dL   RDW 17.1 (*) 11.5 - 15.5 %   Platelets 302  150 - 400 K/uL  PROTIME-INR     Status: Abnormal   Collection Time    01/19/14  2:30 AM      Result Value Ref Range   Prothrombin Time 24.8 (*) 11.6 - 15.2 seconds   INR 2.24 (*)  0.00 - 1.49  GLUCOSE, CAPILLARY     Status: Abnormal   Collection Time    01/19/14  7:56 AM      Result Value Ref Range   Glucose-Capillary 51 (*) 70 - 99 mg/dL   Comment 1 Notify RN    GLUCOSE, CAPILLARY     Status: None   Collection Time    01/19/14 10:09 AM      Result Value Ref Range   Glucose-Capillary 93  70 - 99 mg/dL    Studies/Results: No results found.  Medications: reviewed   @PROBHOSP @  Patinet is very sleepy today    Acute on chronic systolic CHF  Diuresed some yesterday but Cr has bumped  Would hold  2.  Hhythm  REmains AV paced.  On coumadin  This is concerning with continued tx requirements.  With continued tx requirements  I would recomm stopping coumadin  I do not think risk/benefit ratio is in her favor to continue.    3.  Anema  Set to get blood today  4  Renal  Follow cr  Replete K    LOS: 3 days   Dorris Carnes 01/19/2014, 10:42 AM

## 2014-01-19 NOTE — Progress Notes (Addendum)
ANTICOAGULATION CONSULT NOTE - Initial Consult  Pharmacy Consult for Coumadin Indication: atrial fibrillation  Allergies  Allergen Reactions  . Betadine [Povidone Iodine] Itching  . Capoten [Captopril] Other (See Comments)    unknown  . Codone [Hydrocodone] Nausea And Vomiting  . Methadone Nausea And Vomiting  . Penicillins Swelling  . Propoxyphene And Methadone     Intolerance to darvocet    Patient Measurements: Height: 5\' 3"  (160 cm) Weight: 233 lb 4 oz (105.8 kg) IBW/kg (Calculated) : 52.4  Vital Signs: Temp: 97.6 F (36.4 C) (07/19 0800) Temp src: Oral (07/19 0800) BP: 115/79 mmHg (07/19 0932) Pulse Rate: 89 (07/19 0932)  Labs:  Recent Labs  01/16/14 2040 01/17/14 0306 01/17/14 0755 01/17/14 0855 01/18/14 0240 01/19/14 0230  HGB  --  7.8*  --   --  9.1* 8.3*  HCT  --  25.7*  --   --  29.5* 26.3*  PLT  --  249  --   --  291 302  LABPROT  --   --  32.1*  --  32.7* 24.8*  INR  --   --  3.12*  --  3.19* 2.24*  CREATININE  --  2.25*  --   --  2.57* 2.66*  TROPONINI <0.30 <0.30  --  <0.30  --   --     Estimated Creatinine Clearance: 20 ml/min (by C-G formula based on Cr of 2.66).   Medical History: Past Medical History  Diagnosis Date  . Coronary artery disease     a. Severe two vessel; s/p CABG (LIMA-LAD, SVG-diag, SVG-PDA);  b. 05/2012 NSTEMI in setting of rapid aflutter;  c. 06/2012 Lexiscan cardiolite: small, mild reversible defect in lateral wall->mild ischemia, low-risk->med Rx.  Marland Kitchen Hypertension   . Type II diabetes mellitus   . Hyperlipidemia   . Diabetic neuropathy   . CVA (cerebral vascular accident) 2004  . Hypothyroidism   . Chronic anemia   . GERD (gastroesophageal reflux disease)   . Arthritis   . Memory loss, short term   . COPD (chronic obstructive pulmonary disease)   . Blood transfusion without reported diagnosis   . Chronic combined systolic and diastolic CHF (congestive heart failure)     a. 07/2012 Echo: EF 30%, Gr II DD, Mild  AS/MR, Mod-Sev TR, mild bi-atrial and RV dil, PASP 26mmHg.  . Moderate mitral regurgitation     a. mod by TEE 2012, mild by echo 2013, 07/2012  . Severe tricuspid regurgitation     a. Severe by TEE 2012, mod by echo 2013, mod-sev by echo 07/2012  . Chronic vulvitis 07/1993  . Idiopathic anemia 2006  . Atrial fibrillation     a. Failed amiodarone (prolonged QT), not a candidate for class IC meds due to her CAD. No Multaq due to CHF hx;  b. 07/2012 s/p SJM Anthem RF Bi-V PPM, ser # [id number] and AV node RFCA;  c. chronic coumadin.  . Atrial flutter   . Pacemaker   . GI bleed     a. 10/2011 - Dr Benson Norway did scope which showed hiatal hernia, colon revealed diverticula (diverticular bleed), internal/ext hemorrhoids.    Medications:  Scheduled:  . amiodarone  100 mg Oral Daily  . amitriptyline  75 mg Oral QHS  . atorvastatin  40 mg Oral QPM  . benzonatate  200 mg Oral TID  . diltiazem  240 mg Oral Daily  . donepezil  10 mg Oral QHS  . ezetimibe  10 mg Oral QPM  .  insulin aspart  0-15 Units Subcutaneous TID WC  . insulin aspart  0-5 Units Subcutaneous QHS  . insulin glargine  37 Units Subcutaneous BID  . levothyroxine  100 mcg Oral q morning - 10a  . metoprolol succinate  50 mg Oral Daily  . pantoprazole  40 mg Oral Daily  . potassium chloride SA  20 mEq Oral Daily  . sodium chloride  3 mL Intravenous Q12H    Assessment: 80 yo F presenting on 7/16 with SOB. Patient on warfarin PTA for afib which was on hold for anemia and h/o GIB. Currently no evidence of GIB despite heme + and H/H had initially improved after 1 unit PRBC. Hgb down again today so pt will receive another transfusion. Pt is s/p Feraheme on 7/18. INR on admit slightly elevated above her goal and continued to rise to 3.19. INR is now therapeutic at 2.24 after 3 held doses. After discussion with Dr. Harrington Challenger, will continue to hold Coumadin at least until hgb stabilizes and consider an alternative anticoagulant option.    Home dose: 10 mg  daily except 7.5 mg on T,T (last dose on 7/15)  Goal of Therapy:  INR goal: 2-2.5 Monitor platelets by anticoagulation protocol: Yes   Plan:  - Continue to hold Coumadin tonight - Daily PT/INR - Monitor CBC, s/s bleeding - F/u consideration of alternative options for patient  Harolyn Rutherford, PharmD Clinical Pharmacist - Resident Pager: [phone] Pharmacy: [phone] 01/19/2014 12:02 PM

## 2014-01-19 NOTE — Progress Notes (Signed)
Subjective: Admitted July 16 c Vol Overload, CHF Rxed c IV Lasix, Metolazone and reprogramming Pacer Also Sxatic Anemia - - No evidence acute GIB despite heme +.  She is S/P 1 Unit PRBC NonProductive Cough - CXR C/w CHF already improving. She has been sleeping better here in hospital. Her volume and weight are down No Resp issues. HR 70's and doing better cr is going up/Potassium is down. Got Potassium this am and had strange reaction but CBG was low this am also.Ebbie Latus held yesterday  Discussed case c Dr Harrington Challenger this am.  Sitting in chair lethargic this am - rechecked CBG 93 She woke up long enough to tell me she feels better   Objective: Vital signs in last 24 hours: Temp:  [97.2 F (36.2 C)-98.8 F (37.1 C)] 97.6 F (36.4 C) (07/19 0800) Pulse Rate:  [70-89] 89 (07/19 0932) Resp:  [20-22] 20 (07/19 0620) BP: (115-136)/(38-79) 115/79 mmHg (07/19 0932) SpO2:  [92 %-95 %] 93 % (07/19 0620) Weight:  [105.8 kg (233 lb 4 oz)] 105.8 kg (233 lb 4 oz) (07/19 0405) Weight change:  Last BM Date: 01/17/14  CBG (last 3)   Recent Labs  01/18/14 2127 01/19/14 0756 01/19/14 1009  GLUCAP 180* 51* 93    Intake/Output from previous day:  Intake/Output Summary (Last 24 hours) at 01/19/14 1014 Last data filed at 01/19/14 0500  Gross per 24 hour  Intake    210 ml  Output   2850 ml  Net  -2640 ml   07/18 0701 - 07/19 0700 In: 260 [I.V.:110; IV Piggyback:150] Out: 2850 [Urine:2850]   Physical Exam  General appearance: A and O x 2 but more sleepy today Eyes: no scleral icterus Throat: oropharynx moist without erythema Resp: Decrease at bases.  Dry cough Cardio: Tachy 120 GI: Obese, soft, non-tender; bowel sounds normal; no masses,  no organomegaly Extremities: trace Edema  Lab Results:  Recent Labs  01/18/14 0240 01/19/14 0230  NA 132* 134*  K 3.7 2.8*  CL 91* 90*  CO2 24 28  GLUCOSE 122* 125*  BUN 44* 48*  CREATININE 2.57* 2.66*  CALCIUM 8.6 8.3*    No  results found for this basename: AST, ALT, ALKPHOS, BILITOT, PROT, ALBUMIN,  in the last 72 hours   Recent Labs  01/18/14 0240 01/19/14 0230  WBC 6.3 7.5  HGB 9.1* 8.3*  HCT 29.5* 26.3*  MCV 87.8 86.5  PLT 291 302    Lab Results  Component Value Date   INR 2.24* 01/19/2014   INR 3.19* 01/18/2014   INR 3.12* 01/17/2014     Recent Labs  01/16/14 2040 01/17/14 0306 01/17/14 0855  TROPONINI <0.30 <0.30 <0.30     Recent Labs  01/17/14 0306  TSH 0.653     Recent Labs  01/17/14 0306  TIBC 353  IRON 19*    Micro Results: Recent Results (from the past 240 hour(s))  MRSA PCR SCREENING     Status: None   Collection Time    01/16/14  9:59 PM      Result Value Ref Range Status   MRSA by PCR NEGATIVE  NEGATIVE Final   Comment:            The GeneXpert MRSA Assay (FDA     approved for NASAL specimens     only), is one component of a     comprehensive MRSA colonization     surveillance program. It is not     intended to diagnose MRSA  infection nor to guide or     monitor treatment for     MRSA infections.     Studies/Results: No results found.   Medications: Scheduled: . amiodarone  100 mg Oral Daily  . amitriptyline  75 mg Oral QHS  . atorvastatin  40 mg Oral QPM  . benzonatate  200 mg Oral TID  . diltiazem  240 mg Oral Daily  . donepezil  10 mg Oral QHS  . ezetimibe  10 mg Oral QPM  . furosemide  20 mg Oral BID  . insulin aspart  0-15 Units Subcutaneous TID WC  . insulin aspart  0-5 Units Subcutaneous QHS  . insulin glargine  37 Units Subcutaneous BID  . levothyroxine  100 mcg Oral q morning - 10a  . metoprolol succinate  50 mg Oral Daily  . pantoprazole  40 mg Oral Daily  . potassium chloride SA  20 mEq Oral Daily  . sodium chloride  3 mL Intravenous Q12H   Continuous:    Assessment/Plan: Active Problems:   Atrial fibrillation   Chronic anticoagulation   GI bleed   CKD (chronic kidney disease) stage 3, GFR 30-59 ml/min   Acute on  chronic combined systolic and diastolic congestive heart failure, NYHA class 4   Coronary artery disease   Hx of CABG   Pacemaker   CHF (congestive heart failure)  CHF (congestive heart failure) - Acute on chronic CHF - Vol Overload - S/P Sig Diuresis. Will back off and return to PO diuretics as cr went Up.  I D/ced Lasix IV gtt and started lasix 20 mg PO BID  Will ask Cards to review.  Afib/CHF/Pacer - biventricular pacemaker/CAD/3v CABG/Prior MI. Per cards. On meds. Device interogated and Dr Joylene Grapes adjusted settings.  AFlutter - Amio   CKDz stage 4 c baseline Crs 1.8 - 2.1.  Follow Cr c Lasix usage.  Now up to 2.25-2.66.  May need Renal Consult if continue to go up. K being repleted and I cut back on Lasix as stated above.   Dementia - Stable. Last MMSE 25/30.  Remains on Donepezil 10   Chronic Anticoagulation - Goal INR 2.0 - 2.5. INRs better. Pharm to manage  Worsening Anemia c hbg 7.8 on admission and stable c no evidence of sig bleeed - S/P 1 unit PRBC c appropriate rise in Hbg but dropped again. She is UTD on scopes and despite Heme + she is not acting like an acute GI Bleed.  I gave her Feraheme yesterday. Will follow. Probably will not need GI W/up as she had EGD 01/19/13 and had Gastric and Duodenal Polyp - nonbleeding; Colon showed Diverticulosis.  May need Capsule Endo if Counts not remaining stable.  Will give one more unit. May also get Aranesp?   AFTT - Chronic and family helps in her care.  PT = Home health PT;Supervision/Assistance - 24 hour and OT.  May move to floor soon Morbid Obesity. Desperately needs weight loss.  Prior CVA 04/2003. RF Reduce.  GERD - Stay On PPI  Lipids - Lipitor and Zetia  HTN - BP fine.  Hypothyroid - on meds. TSH 0.653 Will follow along c you and adjust things where needed.   DM2 - Insulin Requiring. Titrate Lantus/Humalog. She usually needs less medication in hospital as I do not believe she does her meds correctly at home. CBGs are OK and  low.  Continue to adjust.  -   Recent Labs  01/18/14 2127 01/19/14 0756 01/19/14 1009  GLUCAP  180* 51* 93      LOS: 3 days   Shaneika Rossa M 01/19/2014, 10:14 AM

## 2014-01-19 NOTE — Progress Notes (Signed)
Pt called out for RN. Pt c/o of "feeling hot and strange immediately after taking potassium" Upon assessment Pt was warm to touch, skin clammy, lungs clear w/ no signs of resp distress, S1S2 heart sounds auscultated, BS present. BP 136/44(70), HR 70, RR 17, O2 92 on RA. Pt denies pain or SOB. Pt repositioned, bedcovers removed, cool washcloth placed on forehead, and Supplements O2 via [place] as 2L/m started for comfort. Will continue to monitor and assess pt condition.

## 2014-01-20 DIAGNOSIS — I2510 Atherosclerotic heart disease of native coronary artery without angina pectoris: Secondary | ICD-10-CM

## 2014-01-20 DIAGNOSIS — Z7901 Long term (current) use of anticoagulants: Secondary | ICD-10-CM

## 2014-01-20 DIAGNOSIS — I428 Other cardiomyopathies: Secondary | ICD-10-CM

## 2014-01-20 LAB — TYPE AND SCREEN
ABO/RH(D): A POS
Antibody Screen: NEGATIVE
Donor AG Type: NEGATIVE
Donor AG Type: NEGATIVE
UNIT DIVISION: 0
Unit division: 0

## 2014-01-20 LAB — CBC
HCT: 30.6 % — ABNORMAL LOW (ref 36.0–46.0)
Hemoglobin: 9.5 g/dL — ABNORMAL LOW (ref 12.0–15.0)
MCH: 27.1 pg (ref 26.0–34.0)
MCHC: 31 g/dL (ref 30.0–36.0)
MCV: 87.2 fL (ref 78.0–100.0)
PLATELETS: 299 10*3/uL (ref 150–400)
RBC: 3.51 MIL/uL — AB (ref 3.87–5.11)
RDW: 16.9 % — ABNORMAL HIGH (ref 11.5–15.5)
WBC: 7 10*3/uL (ref 4.0–10.5)

## 2014-01-20 LAB — GLUCOSE, CAPILLARY
GLUCOSE-CAPILLARY: 82 mg/dL (ref 70–99)
Glucose-Capillary: 183 mg/dL — ABNORMAL HIGH (ref 70–99)
Glucose-Capillary: 137 mg/dL — ABNORMAL HIGH (ref 70–99)
Glucose-Capillary: 237 mg/dL — ABNORMAL HIGH (ref 70–99)
Glucose-Capillary: 334 mg/dL — ABNORMAL HIGH (ref 70–99)

## 2014-01-20 LAB — PROTIME-INR
INR: 1.56 — ABNORMAL HIGH (ref 0.00–1.49)
Prothrombin Time: 18.7 seconds — ABNORMAL HIGH (ref 11.6–15.2)

## 2014-01-20 MED ORDER — FUROSEMIDE 40 MG PO TABS
40.0000 mg | ORAL_TABLET | Freq: Two times a day (BID) | ORAL | Status: DC
  Administered 2014-01-20 – 2014-01-21 (×2): 40 mg via ORAL
  Filled 2014-01-20 (×4): qty 1

## 2014-01-20 MED ORDER — DARBEPOETIN ALFA-POLYSORBATE 200 MCG/0.4ML IJ SOLN
200.0000 ug | Freq: Once | INTRAMUSCULAR | Status: AC
  Administered 2014-01-20: 200 ug via SUBCUTANEOUS
  Filled 2014-01-20 (×2): qty 0.4

## 2014-01-20 MED ORDER — INSULIN GLARGINE 100 UNIT/ML ~~LOC~~ SOLN
39.0000 [IU] | Freq: Two times a day (BID) | SUBCUTANEOUS | Status: DC
  Administered 2014-01-20 – 2014-01-22 (×5): 39 [IU] via SUBCUTANEOUS
  Filled 2014-01-20 (×6): qty 0.39

## 2014-01-20 NOTE — Progress Notes (Signed)
Inpatient Diabetes Program Recommendations  AACE/ADA: New Consensus Statement on Inpatient Glycemic Control (2013)  Target Ranges:  Prepandial:   less than 140 mg/dL      Peak postprandial:   less than 180 mg/dL (1-2 hours)      Critically ill patients:  140 - 180 mg/dL   Results for [name], [name] (MRN [id number]) as of 01/20/2014 10:10  Ref. Range 01/19/2014 07:56 01/19/2014 10:09 01/19/2014 11:37 01/19/2014 16:55 01/19/2014 21:36 01/20/2014 08:00  Glucose-Capillary Latest Range: 70-99 mg/dL 51 (L) 93 111 (H) 166 (H) 270 (H) 82   Diabetes history: DM2 Outpatient Diabetes medications: Lantus 50 units BID, Humalog 20-25 units BID (depending on blood glucose) Current orders for Inpatient glycemic control: Lantus 39 units BID (increased this morning), Novolog 0-15 units AC, Novolog 0-5 units HS  Inpatient Diabetes Program Recommendations Insulin - Basal: Noted fasting glucose 82 mg/dl this morning and patient received Lantus 37 units last night.. May want to consider changing Lantus to Lanuts 39 units QAM and Lantus 36 units QPM.  Thanks, Barnie Alderman, RN, MSN, [place] [facility] Coordinator Inpatient Diabetes Program [phone] (Team Pager) [phone] ([facility] office) [phone] [facility] office)

## 2014-01-20 NOTE — Care Management Note (Addendum)
    Page 1 of 1   01/22/2014     11:50:44 AM CARE MANAGEMENT NOTE 01/22/2014  Patient:  CYTHINA, MICKELSEN   Account Number:  [id number]  Date Initiated:  01/20/2014  Documentation initiated by:  Elissa Hefty  Subjective/Objective Assessment:   adm w heart failure     Action/Plan:   lives w husband, pcp dr Shon Baton   Anticipated DC Date:  01/22/2014   Anticipated DC Plan:  [place]  CM consult      Choice offered to / List presented to:  C-1 Patient        [place] arranged  HH-1 RN  [place] [facility]      [place].   Status of service:  Completed, signed off Medicare Important Message given?  YES (If response is "NO", the following Medicare IM given date fields will be blank) Date Medicare IM given:  01/20/2014 Medicare IM given by:  Elissa Hefty Date Additional Medicare IM given:  01/22/2014 Additional Medicare IM given by:  [employer] Kamarie Veno  Discharge Disposition:  [place]  Per UR Regulation:  Reviewed for med. necessity/level of care/duration of stay  If discussed at [place] of Stay Meetings, dates discussed:   01/21/2014    Comments:  01/22/14 Kona Lover J. Clydene Laming, RN, BSN, [employer] [phone] Spoke with pt at bedside regarding discharge planning for [facility]. Offered pt list of home health agencies to choose from.  Pt chose [facility] to render services. Janae Sauce, RN of [facility] notified.  No DME needs identified at this time.

## 2014-01-20 NOTE — Progress Notes (Signed)
Physical Therapy Treatment Patient Details Name: [name] MRN: [id number] DOB: 1933-02-17 Today's Date: 01/20/2014    History of Present Illness Patient is a 80 yo female admitted 01/16/14 with increased SOB, orthopnea, chest pressure, marked increase in edema and weight gain. She was felt to be in a/c CHF. EKG also demonstrated atrial fibrillation with a ventricular response of 94 bpm.   Patient with h/o CAD, CABG, cardiomyopathy, Afib, pacemaker, DM, CHF, HTN, neuropathy, CVA, COPD.    PT Comments    Pt progressing towards physical therapy goals. Was able to ambulate 125 feet this session with RW and standing/seated rest breaks when needed for DOE. Pt resting comfortably at end of session, and therapist discussed HEP for LE strengthening between PT sessions.   Follow Up Recommendations  Home health PT;Supervision/Assistance - 24 hour     Equipment Recommendations  None recommended by PT    Recommendations for Other Services       Precautions / Restrictions Precautions Precautions: Fall Restrictions Weight Bearing Restrictions: No    Mobility  Bed Mobility               General bed mobility comments: Pt sitting up in recliner upon PT arrival.   Transfers Overall transfer level: Needs assistance Equipment used: Rolling walker (2 wheeled) Transfers: Sit to/from [facility] Sit to Stand: Min assist;+2 physical assistance Stand pivot transfers: Min assist       General transfer comment: From low recliner chair, min assist +2 required to achieve full standing. Was able to stand from the [facility] and EOB with min guard.   Ambulation/Gait Ambulation/Gait assistance: Min guard Ambulation Distance (Feet): 125 Feet Assistive device: Rolling walker (2 wheeled) Gait Pattern/deviations: Step-through pattern;Decreased stride length;Trendelenburg;Trunk flexed Gait velocity: Decreased Gait velocity interpretation: Below normal speed for age/gender General  Gait Details: Standing rest break due to SOB, with 1 seated rest break. Pt sat ~1 minute and was willing to ambulate back to the room. Somewhat SOB when returning to room, however sats >90% on RA.    Stairs            Wheelchair Mobility    Modified Rankin (Stroke Patients Only)       Balance Overall balance assessment: Needs assistance Sitting-balance support: Feet supported;No upper extremity supported Sitting balance-Leahy Scale: Good     Standing balance support: Bilateral upper extremity supported;During functional activity Standing balance-Leahy Scale: Fair Standing balance comment: RW use during ambulation, however feel pt can take a few steps without UE support. Was able to transfer bed>recliner without UE assist.                     Cognition Arousal/Alertness: Awake/alert Behavior During Therapy: WFL for tasks assessed/performed Overall Cognitive Status: Within Functional Limits for tasks assessed                      Exercises General Exercises - Lower Extremity Ankle Circles/Pumps: 20 reps Quad Sets: 15 reps Gluteal Sets: 15 reps    General Comments        Pertinent Vitals/Pain Vitals stable throughout session.     Home Living                      Prior Function            PT Goals (current goals can now be found in the care plan section) Acute Rehab PT Goals Patient Stated Goal: To get stronger PT  Goal Formulation: With patient Time For Goal Achievement: 01/25/14 Potential to Achieve Goals: Good Progress towards PT goals: Progressing toward goals    Frequency  Min 3X/week    PT Plan Current plan remains appropriate    Co-evaluation             End of Session Equipment Utilized During Treatment: Gait belt Activity Tolerance: Patient limited by fatigue Patient left: in chair;with call bell/phone within reach     Time: 7356-7014 PT Time Calculation (min): 28 min  Charges:  $Gait Training: 8-22  mins $Therapeutic Activity: 8-22 mins                    G Codes:      Jolyn Lent 03-Feb-2014, 2:16 PM  Jolyn Lent, PT, DPT Acute Rehabilitation Services Pager: [phone]

## 2014-01-20 NOTE — Progress Notes (Signed)
Subjective: Admitted July 16 c Vol Overload, CHF = Rxed c IV Lasix, Metolazone and reprogramming Pacer Also Sxatic Anemia - - No evidence acute GIB despite heme +.  She is S/P 2 Unit PRBC Her volume and weight are down but cr up to @ 2.6 No Resp issues.  She feels better, just weak.  Less swollen than admit.  More awake today HR 70's paced and doing better cr is stable today K = 3.6 today. metalozone D/ced and Lasix gtt converted to PO  Discussed case yesterday c Dr Harrington Challenger.    Objective: Vital signs in last 24 hours: Temp:  [97.6 F (36.4 C)-98.3 F (36.8 C)] 98.1 F (36.7 C) (07/20 0346) Pulse Rate:  [74-89] 75 (07/20 0518) Resp:  [16-20] 16 (07/20 0000) BP: (109-133)/(32-90) 133/45 mmHg (07/20 0518) SpO2:  [95 %-100 %] 100 % (07/20 0518) Weight:  [99.202 kg (218 lb 11.2 oz)] 99.202 kg (218 lb 11.2 oz) (07/20 0518) Weight change: -6.598 kg (-14 lb 8.8 oz) Last BM Date: 01/17/14  CBG (last 3)   Recent Labs  01/19/14 1137 01/19/14 1655 01/19/14 2136  GLUCAP 111* 166* 270*    Intake/Output from previous day:  Intake/Output Summary (Last 24 hours) at 01/20/14 0702 Last data filed at 01/20/14 0245  Gross per 24 hour  Intake    590 ml  Output   2800 ml  Net  -2210 ml   07/19 0701 - 07/20 0700 In: 590 [I.V.:250; Blood:340] Out: 2800 [Urine:2800]   Physical Exam  General appearance: A and O x 2, much happier today OP: oropharynx moist without erythema Resp: Decrease at bases.   Cardio: Reg paced 70s GI: Obese, soft, non-tender; bowel sounds normal; no masses,  no organomegaly Extremities: trace Edema, Brawny  Lab Results:  Recent Labs  01/19/14 0230 01/19/14 1831  NA 134* 134*  K 2.8* 3.6*  CL 90* 91*  CO2 28 27  GLUCOSE 125* 180*  BUN 48* 46*  CREATININE 2.66* 2.58*  CALCIUM 8.3* 8.6    No results found for this basename: AST, ALT, ALKPHOS, BILITOT, PROT, ALBUMIN,  in the last 72 hours   Recent Labs  01/19/14 2211 01/20/14 0230  WBC 7.2 7.0   HGB 10.3* 9.5*  HCT 33.0* 30.6*  MCV 89.2 87.2  PLT 293 299    Lab Results  Component Value Date   INR 1.56* 01/20/2014   INR 2.24* 01/19/2014   INR 3.19* 01/18/2014     Recent Labs  01/17/14 0855  TROPONINI <0.30    No results found for this basename: TSH, T4TOTAL, FREET3, T3FREE, THYROIDAB,  in the last 72 hours  No results found for this basename: VITAMINB12, FOLATE, FERRITIN, TIBC, IRON, RETICCTPCT,  in the last 72 hours  Micro Results: Recent Results (from the past 240 hour(s))  MRSA PCR SCREENING     Status: None   Collection Time    01/16/14  9:59 PM      Result Value Ref Range Status   MRSA by PCR NEGATIVE  NEGATIVE Final   Comment:            The GeneXpert MRSA Assay (FDA     approved for NASAL specimens     only), is one component of a     comprehensive MRSA colonization     surveillance program. It is not     intended to diagnose MRSA     infection nor to guide or     monitor treatment for  MRSA infections.     Studies/Results: No results found.   Medications: Scheduled: . amiodarone  100 mg Oral Daily  . amitriptyline  75 mg Oral QHS  . atorvastatin  40 mg Oral QPM  . benzonatate  200 mg Oral TID  . diltiazem  240 mg Oral Daily  . donepezil  10 mg Oral QHS  . ezetimibe  10 mg Oral QPM  . insulin aspart  0-15 Units Subcutaneous TID WC  . insulin aspart  0-5 Units Subcutaneous QHS  . insulin glargine  37 Units Subcutaneous BID  . levothyroxine  100 mcg Oral q morning - 10a  . metoprolol succinate  50 mg Oral Daily  . pantoprazole  40 mg Oral Daily  . potassium chloride SA  20 mEq Oral Daily  . sodium chloride  3 mL Intravenous Q12H   Continuous:    Assessment/Plan: Active Problems:   Atrial fibrillation   Chronic anticoagulation   GI bleed   CKD (chronic kidney disease) stage 3, GFR 30-59 ml/min   Acute on chronic combined systolic and diastolic congestive heart failure, NYHA class 4   Coronary artery disease   Hx of CABG    Pacemaker   CHF (congestive heart failure)  CHF (congestive heart failure) - Acute on chronic CHF - Vol Overload - S/P Sig Diuresis. I converted her to PO Lasix yesterday and Dr Harrington Challenger held it. Cr the same to min better this am  Afib/CHF/Pacer - biventricular pacemaker/CAD/3v CABG/Prior MI. Per cards. On meds. Device interogated and Dr Joylene Grapes adjusted settings.  AFlutter - Amio   CKDz stage 4 c baseline Crs 1.8 - 2.1.  Follow Cr c Lasix usage.  Now up to 2.25-2.66 -2.58.  May need Renal Consult if continue to go up. K being replaced as needed.   Dementia - Stable. Last MMSE 25/30.  Remains on Donepezil 10   Chronic Anticoagulation - Goal INR 2.0 - 2.5. INRs better. Pharm managing and Coumadin still being held.  INR 1.56 today  Worsening Anemia c hbg 7.8 on admission and stable c no evidence of sig bleeed - S/P 1 unit PRBC c appropriate rise in Hbg but dropped again to 8.3.  Hbg last night 10.3 and now 9.5 - the 9.5 seems appropriate rise in Hbg post 2nd unit.  She is UTD on scopes and despite Heme + she is not acting like an acute GI Bleed.  I gave her Shirlean Kelly the other day and will give 200 Aranesp. Will follow. Probably will not need GI W/up as she had EGD 01/19/13 and had Gastric and Duodenal Polyp - nonbleeding; Colon showed Diverticulosis.  May need Capsule Endo if Counts not remaining stable.  Will give one more unit.    AFTT - Chronic and family helps in her care.  PT = Home health PT;Supervision/Assistance - 24 hour and OT.  May move to floor soon  Morbid Obesity. Desperately needs weight loss.   Prior CVA 04/2003. RF Reduce.   GERD - Stay On PPI   Lipids - Lipitor and Zetia   HTN - BP fine.   Hypothyroid - on meds. TSH 0.653  DM2 - Insulin Requiring. Titrate Lantus/Humalog. Continue to adjust as needed.  -   Recent Labs  01/19/14 1137 01/19/14 1655 01/19/14 2136  GLUCAP 111* 166* 270*      LOS: 4 days   Cregg Jutte M 01/20/2014, 7:02 AM

## 2014-01-20 NOTE — Progress Notes (Signed)
PCP: Dr. Virgina Jock EP Dr. Lovena Le Cardiology Dr. [place]  Subjective:  80 year old admitted with acute on chronic systolic heart failure, chronic pacemaker, chronic anticoagulation, anemia, chronic kidney disease.  Objective:  Vital Signs in the last 24 hours: Temp:  [97.6 F (36.4 C)-98.3 F (36.8 C)] 97.6 F (36.4 C) (07/20 1138) Pulse Rate:  [69-75] 69 (07/20 0756) Resp:  [16-26] 26 (07/20 1138) BP: (115-133)/(41-66) 115/41 mmHg (07/20 1138) SpO2:  [95 %-100 %] 98 % (07/20 1138) Weight:  [218 lb 11.2 oz (99.202 kg)] 218 lb 11.2 oz (99.202 kg) (07/20 0518)  Intake/Output from previous day: 07/19 0701 - 07/20 0700 In: 590 [I.V.:250; Blood:340] Out: 2800 [Urine:2800]   Physical Exam: General: Well developed, well nourished, in no acute distress. Head:  Normocephalic and atraumatic. Lungs: Clear to auscultation mildly decreased at bases Heart: Normal S1 and S2.  No murmur, rubs or gallops.  Abdomen: soft, non-tender, positive bowel sounds. Extremities: No clubbing or cyanosis. No edema. Neurologic: Alert and oriented.    Lab Results:  Recent Labs  01/19/14 2211 01/20/14 0230  WBC 7.2 7.0  HGB 10.3* 9.5*  PLT 293 299    Recent Labs  01/19/14 0230 01/19/14 1831  NA 134* 134*  K 2.8* 3.6*  CL 90* 91*  CO2 28 27  GLUCOSE 125* 180*  BUN 48* 46*  CREATININE 2.66* 2.58*   Telemetry: AV pacing Personally viewed.   Cardiac Studies:  06/11/13: EF 40-45%  . amiodarone  100 mg Oral Daily  . amitriptyline  75 mg Oral QHS  . atorvastatin  40 mg Oral QPM  . benzonatate  200 mg Oral TID  . darbepoetin (ARANESP) injection - NON-DIALYSIS  200 mcg Subcutaneous Once  . diltiazem  240 mg Oral Daily  . donepezil  10 mg Oral QHS  . ezetimibe  10 mg Oral QPM  . insulin aspart  0-15 Units Subcutaneous TID WC  . insulin aspart  0-5 Units Subcutaneous QHS  . insulin glargine  39 Units Subcutaneous BID  . levothyroxine  100 mcg Oral q morning - 10a  . metoprolol succinate  50  mg Oral Daily  . pantoprazole  40 mg Oral Daily  . potassium chloride SA  20 mEq Oral Daily  . sodium chloride  3 mL Intravenous Q12H   Assessment/Plan:  Active Problems:   Atrial fibrillation   Chronic anticoagulation   GI bleed   CKD (chronic kidney disease) stage 3, GFR 30-59 ml/min   Acute on chronic combined systolic and diastolic congestive heart failure, NYHA class 4   Coronary artery disease   Hx of CABG   Pacemaker   CHF (congestive heart failure)  1. Acute on chronic systolic heart failure-initially, aggressive IV diuresis with Lasix, concomitant Zaroxolyn. Baseline creatinine was approximately 2.2, increased to 2.6. diuresis was held. By mouth Lasix currently being utilized. I agree. I will resume Lasix at 40 mg twice a day. No at home, she was on 80 mg twice a day. It is likely that she will need an increase in this medication. We have discussed the importance of salt restriction as well as fluid restriction.  2. Chronic kidney disease stage III-slightly worse on this admission. She has been given a holiday from diuretics. We will go ahead and resume.  3. Pacemaker-functioning well.  4. Chronic anticoagulation-currently on hold because of worsening anemia. One unit of blood administered. Given her anemia she is at increased risk. Of course, she is at increased risk for sure without chronic anticoagulation.  5. Coronary artery disease-prior 3 vessel CABG, old myocardial infarction.  6. Atrial fibrillation-amiodarone. Low-dose.  I'm comfortable with her being transferred to floor. I will write orders.   Madyn Ivins, [place] 01/20/2014, 12:50 PM

## 2014-01-21 ENCOUNTER — Ambulatory Visit: Payer: Medicare Other | Admitting: Cardiology

## 2014-01-21 DIAGNOSIS — N184 Chronic kidney disease, stage 4 (severe): Secondary | ICD-10-CM

## 2014-01-21 DIAGNOSIS — N039 Chronic nephritic syndrome with unspecified morphologic changes: Secondary | ICD-10-CM

## 2014-01-21 DIAGNOSIS — D631 Anemia in chronic kidney disease: Secondary | ICD-10-CM

## 2014-01-21 LAB — BASIC METABOLIC PANEL
Anion gap: 13 (ref 5–15)
BUN: 46 mg/dL — AB (ref 6–23)
CALCIUM: 8.7 mg/dL (ref 8.4–10.5)
CO2: 31 mEq/L (ref 19–32)
Chloride: 95 mEq/L — ABNORMAL LOW (ref 96–112)
Creatinine, Ser: 2.25 mg/dL — ABNORMAL HIGH (ref 0.50–1.10)
GFR, EST AFRICAN AMERICAN: 23 mL/min — AB (ref >90)
GFR, EST NON AFRICAN AMERICAN: 20 mL/min — AB (ref >90)
Glucose, Bld: 65 mg/dL — ABNORMAL LOW (ref 70–99)
Potassium: 3.2 mEq/L — ABNORMAL LOW (ref 3.7–5.3)
Sodium: 139 mEq/L (ref 137–147)

## 2014-01-21 LAB — CBC
HCT: 31.9 % — ABNORMAL LOW (ref 36.0–46.0)
Hemoglobin: 9.9 g/dL — ABNORMAL LOW (ref 12.0–15.0)
MCH: 27.3 pg (ref 26.0–34.0)
MCHC: 31 g/dL (ref 30.0–36.0)
MCV: 87.9 fL (ref 78.0–100.0)
PLATELETS: 301 10*3/uL (ref 150–400)
RBC: 3.63 MIL/uL — ABNORMAL LOW (ref 3.87–5.11)
RDW: 17.2 % — AB (ref 11.5–15.5)
WBC: 8.1 10*3/uL (ref 4.0–10.5)

## 2014-01-21 LAB — PROTIME-INR
INR: 1.4 (ref 0.00–1.49)
PROTHROMBIN TIME: 17.2 s — AB (ref 11.6–15.2)

## 2014-01-21 LAB — GLUCOSE, CAPILLARY
Glucose-Capillary: 63 mg/dL — ABNORMAL LOW (ref 70–99)
Glucose-Capillary: 78 mg/dL (ref 70–99)
Glucose-Capillary: 155 mg/dL — ABNORMAL HIGH (ref 70–99)
Glucose-Capillary: 220 mg/dL — ABNORMAL HIGH (ref 70–99)
Glucose-Capillary: 290 mg/dL — ABNORMAL HIGH (ref 70–99)

## 2014-01-21 MED ORDER — POTASSIUM CHLORIDE CRYS ER 20 MEQ PO TBCR
40.0000 meq | EXTENDED_RELEASE_TABLET | Freq: Two times a day (BID) | ORAL | Status: DC
  Administered 2014-01-21 – 2014-01-22 (×3): 40 meq via ORAL
  Filled 2014-01-21 (×4): qty 2

## 2014-01-21 MED ORDER — FUROSEMIDE 40 MG PO TABS
60.0000 mg | ORAL_TABLET | Freq: Two times a day (BID) | ORAL | Status: DC
  Administered 2014-01-21 – 2014-01-22 (×2): 60 mg via ORAL
  Filled 2014-01-21 (×4): qty 1

## 2014-01-21 NOTE — Progress Notes (Signed)
Report handed off to oncoming RN. Pt stable, no complaints. Sitting in chair, resting.

## 2014-01-21 NOTE — Progress Notes (Signed)
Co-signed for Irfat Habib RN/BSN for medication administration, assessments, Is&Os, Vital Signs, Progress Notes, Care Plans, and Patient education. [place] Prout M RN/BSN 

## 2014-01-21 NOTE — Progress Notes (Signed)
Physical Therapy Treatment Patient Details Name: [name] MRN: [id number] DOB: 07-30-1932 Today's Date: 01/21/2014    History of Present Illness Patient is a 80 yo female admitted 01/16/14 with increased SOB, orthopnea, chest pressure, marked increase in edema and weight gain. She was felt to be in a/c CHF. EKG also demonstrated atrial fibrillation with a ventricular response of 94 bpm.   Patient with h/o CAD, CABG, cardiomyopathy, Afib, pacemaker, DM, CHF, HTN, neuropathy, CVA, COPD.    PT Comments    Pt progressing towards physical therapy goals. Was able to improve gait distance with less standing rest breaks and O2 sats remaining >92% on room air. Pt anxious for d/c and asking staff if she could go home today.   Follow Up Recommendations  Home health PT;Supervision/Assistance - 24 hour     Equipment Recommendations  None recommended by PT    Recommendations for Other Services       Precautions / Restrictions Precautions Precautions: Fall Restrictions Weight Bearing Restrictions: No    Mobility  Bed Mobility               General bed mobility comments: Pt sitting up in recliner upon PT arrival.   Transfers Overall transfer level: Needs assistance Equipment used: Rolling walker (2 wheeled) Transfers: Sit to/from Stand Sit to Stand: Min guard         General transfer comment: No physical assist required, however close guard for safety as pt somewhat unbalanced.  Ambulation/Gait Ambulation/Gait assistance: Min guard Ambulation Distance (Feet): 175 Feet Assistive device: Rolling walker (2 wheeled) Gait Pattern/deviations: Step-through pattern;Decreased stride length;Trunk flexed;Narrow base of support Gait velocity: Decreased Gait velocity interpretation: Below normal speed for age/gender General Gait Details: 5 standing rest breaks and 2 seated rest breaks. Pulled the chair behind, and pt was steady enough to stand with RW and supervision while chair  was pulled up behind pt in hallway.    Stairs            Wheelchair Mobility    Modified Rankin (Stroke Patients Only)       Balance Overall balance assessment: Needs assistance Sitting-balance support: Feet supported;No upper extremity supported Sitting balance-Leahy Scale: Good     Standing balance support: Bilateral upper extremity supported;During functional activity Standing balance-Leahy Scale: Fair Standing balance comment: Minimal UE support needed during session today,                    Cognition Arousal/Alertness: Awake/alert Behavior During Therapy: WFL for tasks assessed/performed Overall Cognitive Status: Within Functional Limits for tasks assessed                      Exercises General Exercises - Lower Extremity Long Arc Quad: 15 reps Hip ABduction/ADduction: 15 reps    General Comments        Pertinent Vitals/Pain Pt ambulated on RA. Sats remained >92% throughout session during gait training and therapeutic exercise.      Home Living                      Prior Function            PT Goals (current goals can now be found in the care plan section) Acute Rehab PT Goals Patient Stated Goal: To get stronger PT Goal Formulation: With patient Time For Goal Achievement: 01/25/14 Potential to Achieve Goals: Good Progress towards PT goals: Progressing toward goals    Frequency  Min 3X/week  PT Plan Current plan remains appropriate    Co-evaluation             End of Session Equipment Utilized During Treatment: Gait belt Activity Tolerance: Patient tolerated treatment well Patient left: in chair;with call bell/phone within reach     Time: 1008-1031 PT Time Calculation (min): 23 min  Charges:  $Gait Training: 8-22 mins $Therapeutic Activity: 8-22 mins                    G Codes:      Jolyn Lent 19-Feb-2014, 11:17 AM  Jolyn Lent, PT, DPT Acute Rehabilitation Services Pager:  [phone]

## 2014-01-21 NOTE — Progress Notes (Signed)
Inpatient Diabetes Program Recommendations  AACE/ADA: New Consensus Statement on Inpatient Glycemic Control (2013)  Target Ranges:  Prepandial:   less than 140 mg/dL      Peak postprandial:   less than 180 mg/dL (1-2 hours)      Critically ill patients:  140 - 180 mg/dL   Reason for Assessment:  Results for [name], [name] (MRN [id number]) as of 01/21/2014 10:38  Ref. Range 01/20/2014 11:42 01/20/2014 16:32 01/20/2014 22:11 01/21/2014 06:44 01/21/2014 07:08  Glucose-Capillary Latest Range: 70-99 mg/dL 137 (H) 237 (H) 334 (H) 63 (L) 78   Diabetes history: Diabetes Type 2 Outpatient Diabetes medications: Lantus 50 units bid, Humalog 20-25 units bid Current orders for Inpatient glycemic control:  Lantus 39 units bid,  Novolog moderate tid with meals and HS  Consider reduction of Lantus to 35 units bid.  Also may consider adding Novolog meal coverage 4 units tid with meals.    Thanks, Adah Perl, RN, BC-ADM Inpatient Diabetes Coordinator Pager [phone]

## 2014-01-21 NOTE — Progress Notes (Signed)
Coumadin 

## 2014-01-21 NOTE — Progress Notes (Signed)
Medicare Important Message given. Arnel Wymer J. Lamiah Marmol, RN, BSN, NCM [phone].   

## 2014-01-21 NOTE — Progress Notes (Signed)
Subjective: Admitted July 16 c Vol Overload, CHF = Rxed c IV Lasix, Metolazone and reprogramming Pacer Also Sxatic Anemia - - No evidence acute GIB despite heme +.  She is S/P 2 Unit PRBC and current Hbg is 9.9 Her volume and weight are down but cr up to @ 2.6 and now down to 2.25 but K is low. No Resp issues.   She did well c PT She feels well and is near baseline   Objective: Vital signs in last 24 hours: Temp:  [97 F (36.1 C)-98.4 F (36.9 C)] 98.4 F (36.9 C) (07/21 0654) Pulse Rate:  [61-77] 72 (07/21 0654) Resp:  [16-26] 18 (07/21 0654) BP: (103-135)/(41-66) 130/60 mmHg (07/21 0654) SpO2:  [94 %-99 %] 96 % (07/21 0654) Weight:  [100 kg (220 lb 7.4 oz)-100.8 kg (222 lb 3.6 oz)] 100 kg (220 lb 7.4 oz) (07/21 0654) Weight change: 1.598 kg (3 lb 8.4 oz) Last BM Date: 01/20/14  CBG (last 3)   Recent Labs  01/20/14 1632 01/20/14 2211 01/21/14 0644  GLUCAP 237* 334* 63*    Intake/Output from previous day:  Intake/Output Summary (Last 24 hours) at 01/21/14 0702 Last data filed at 01/21/14 0412  Gross per 24 hour  Intake    420 ml  Output   2601 ml  Net  -2181 ml   07/20 0701 - 07/21 0700 In: 62 [P.O.:420] Out: 2601 [Urine:2600; Stool:1]   Physical Exam  General appearance: A and O x 2, much happier today OP: oropharynx moist without erythema Resp: Decrease at bases.   Cardio: Reg paced 70s. Sternotomy GI: Obese, soft, non-tender; bowel sounds normal; no masses,  no organomegaly Extremities: trace Edema, Brawny  Lab Results:  Recent Labs  01/19/14 1831 01/21/14 0552  NA 134* 139  K 3.6* 3.2*  CL 91* 95*  CO2 27 31  GLUCOSE 180* 65*  BUN 46* 46*  CREATININE 2.58* 2.25*  CALCIUM 8.6 8.7    No results found for this basename: AST, ALT, ALKPHOS, BILITOT, PROT, ALBUMIN,  in the last 72 hours   Recent Labs  01/20/14 0230 01/21/14 0552  WBC 7.0 8.1  HGB 9.5* 9.9*  HCT 30.6* 31.9*  MCV 87.2 87.9  PLT 299 301    Lab Results  Component Value  Date   INR 1.40 01/21/2014   INR 1.56* 01/20/2014   INR 2.24* 01/19/2014    No results found for this basename: CKTOTAL, CKMB, CKMBINDEX, TROPONINI,  in the last 72 hours  No results found for this basename: TSH, T4TOTAL, FREET3, T3FREE, THYROIDAB,  in the last 72 hours  No results found for this basename: VITAMINB12, FOLATE, FERRITIN, TIBC, IRON, RETICCTPCT,  in the last 72 hours  Micro Results: Recent Results (from the past 240 hour(s))  MRSA PCR SCREENING     Status: None   Collection Time    01/16/14  9:59 PM      Result Value Ref Range Status   MRSA by PCR NEGATIVE  NEGATIVE Final   Comment:            The GeneXpert MRSA Assay (FDA     approved for NASAL specimens     only), is one component of a     comprehensive MRSA colonization     surveillance program. It is not     intended to diagnose MRSA     infection nor to guide or     monitor treatment for     MRSA infections.  Studies/Results: No results found.   Medications: Scheduled: . amiodarone  100 mg Oral Daily  . amitriptyline  75 mg Oral QHS  . atorvastatin  40 mg Oral QPM  . benzonatate  200 mg Oral TID  . diltiazem  240 mg Oral Daily  . donepezil  10 mg Oral QHS  . ezetimibe  10 mg Oral QPM  . furosemide  40 mg Oral BID  . insulin aspart  0-15 Units Subcutaneous TID WC  . insulin aspart  0-5 Units Subcutaneous QHS  . insulin glargine  39 Units Subcutaneous BID  . levothyroxine  100 mcg Oral q morning - 10a  . metoprolol succinate  50 mg Oral Daily  . pantoprazole  40 mg Oral Daily  . potassium chloride SA  20 mEq Oral Daily  . sodium chloride  3 mL Intravenous Q12H   Continuous:    Assessment/Plan: Active Problems:   Atrial fibrillation   Chronic anticoagulation   GI bleed   CKD (chronic kidney disease) stage 3, GFR 30-59 ml/min   Acute on chronic combined systolic and diastolic congestive heart failure, NYHA class 4   Coronary artery disease   Hx of CABG   Pacemaker   CHF (congestive  heart failure)  CHF (congestive heart failure) - Acute on chronic CHF - Vol Overload - S/P Sig Diuresis. OK to D/c on PO Lasix. Cr improved to 2.25.  She is down 13#s since July 14 and 7/17.  233.4# to 220.5 today.  She is almost her "dry weight" of 218 that she was in June  Afib/CHF/Pacer - biventricular pacemaker/CAD/3v CABG/Prior MI. Per cards. On meds. Device interogated and Dr Joylene Grapes adjusted settings.  AFlutter - Amio   CKDz stage 4 c baseline Crs 1.8 - 2.1.  Follow Cr c Lasix usage.  Cr 2.25-2.66 -2.58- 2.25.  Replete K.    Chronic Anticoagulation - Goal INR 2.0 - 2.5.  INR 1.40 today. Maybe hold coumadin for a little while then try and restart?  Will leave up to cards  Anemia of Chronic Dz and CKD on Aranesp per Heme.  Given 2 Units PRBCs, feraheme, and Aranesp suring hospitalization.  Hbg 7.8 on admission and now stable at 9.9.  She is UTD on scopes and despite Heme + she is not acting like an acute GI Bleed. Probably will not need GI W/up as she had EGD 01/19/13 and had Gastric and Duodenal Polyp - nonbleeding; Colon showed Diverticulosis.  May need Capsule Endo if Counts not remaining stable.     AFTT - Chronic and family helps in her care.  PT = Home health PT;Supervision/Assistance - 24 hour and OT.  Now on floor and may go home soon,  Morbid Obesity. Desperately needs weight loss.   Prior CVA 04/2003. RF Reduce.   GERD - Stay On PPI   Lipids - Lipitor and Zetia   HTN - BP fine.   Hypothyroid - on meds. TSH 0.653  DM2 - CBGs more erratic yesterday and some low this am.  Insulin Requiring. Titrate Lantus/Humalog. Continue to adjust as needed.  -   Recent Labs  01/20/14 1632 01/20/14 2211 01/21/14 0644  GLUCAP 237* 334* 63*      LOS: 5 days   Kennice Finnie M 01/21/2014, 7:02 AM

## 2014-01-21 NOTE — Progress Notes (Signed)
Occupational Therapy Treatment Patient Details Name: [name] MRN: [id number] DOB: [birth year] Today's Date: 01/21/2014    History of present illness Patient is a 80 yo female admitted 01/16/14 with increased SOB, orthopnea, chest pressure, marked increase in edema and weight gain. She was felt to be in a/c CHF. EKG also demonstrated atrial fibrillation with a ventricular response of 94 bpm.   Patient with h/o CAD, CABG, cardiomyopathy, Afib, pacemaker, DM, CHF, HTN, neuropathy, CVA, COPD.   OT comments  Patient evaluated by Occupational Therapy with no further acute OT needs identified. All education has been completed and the patient has no further questions. See below for any follow-up Occupational Therapy or equipment needs. OT to sign off. Thank you for referral.    Follow Up Recommendations  Home health OT    Equipment Recommendations  None recommended by OT    Recommendations for Other Services      Precautions / Restrictions Precautions Precautions: Fall       Mobility Bed Mobility Overal bed mobility: Modified Independent                Transfers Overall transfer level: Needs assistance   Transfers: Sit to/from Stand Sit to Stand: Supervision              Balance Overall balance assessment: Needs assistance         Standing balance support: No upper extremity supported;During functional activity Standing balance-Leahy Scale: Good                     ADL Overall ADL's : Needs assistance/impaired Eating/Feeding: Modified independent   Grooming: Wash/dry face;Wash/dry hands;Supervision/safety;Standing                   Armed forces technical officer: Supervision/safety;Ambulation;Regular Toilet   Toileting- Water quality scientist and Hygiene: Supervision/safety;Sit to/from stand       Functional mobility during ADLs: Supervision/safety General ADL Comments: Pt is at adequate level for d/c home from OT stand point. Pt able to complete  toilet transfer, bed mobility, chair transfer and sink level grooming. pt is able to reach BIL feet.       Vision                     Perception     Praxis      Cognition   Behavior During Therapy: WFL for tasks assessed/performed Overall Cognitive Status: Within Functional Limits for tasks assessed                       Extremity/Trunk Assessment  Upper Extremity Assessment Upper Extremity Assessment: Overall WFL for tasks assessed   Lower Extremity Assessment Lower Extremity Assessment: Defer to PT evaluation   Cervical / Trunk Assessment Cervical / Trunk Assessment: Normal    Exercises     Shoulder Instructions       General Comments      Pertinent Vitals/ Pain       RA during session VSS  Home Living Family/patient expects to be discharged to:: Private residence Living Arrangements: Spouse/significant other Available Help at Discharge: Family;Available 24 hours/day Type of Home: House Home Access: Stairs to enter [employer] of Steps: 3 Entrance Stairs-Rails: Right;Left Home Layout: One level     Bathroom Shower/Tub: Teacher, early years/pre: Standard     Home Equipment: Environmental consultant - 2 wheels;Cane - single point;Bedside commode;Shower seat          Prior Functioning/Environment Level  of Independence: Independent            Frequency       Progress Toward Goals  OT Goals(current goals can now be found in the care plan section)     Acute Rehab OT Goals Patient Stated Goal: to return home and visit with pastor waiting in the hallway Potential to Achieve Goals: Good  Plan      Co-evaluation                 End of Session     Activity Tolerance Patient tolerated treatment well   Patient Left in chair;with call bell/phone within reach   Nurse Communication Mobility status        Time: 2035-5974 OT Time Calculation (min): 9 min  Charges: OT General Charges $OT Visit: 1 Procedure OT  Evaluation $Initial OT Evaluation Tier I: 1 Procedure  Peri Maris 01/21/2014, 4:23 PM Pager: [phone]

## 2014-01-21 NOTE — Progress Notes (Signed)
80 year old admitted with acute on chronic systolic heart failure, chronic pacemaker, chronic anticoagulation, anemia, chronic kidney disease.     Subjective: Still with cough at times, taking tessalon pearls. No chest pain  Objective: Vital signs in last 24 hours: Temp:  [97 F (36.1 C)-98.4 F (36.9 C)] 98.4 F (36.9 C) (07/21 0654) Pulse Rate:  [61-77] 72 (07/21 0654) Resp:  [18-26] 18 (07/21 0654) BP: (103-135)/(41-66) 130/60 mmHg (07/21 0654) SpO2:  [94 %-99 %] 96 % (07/21 0654) Weight:  [220 lb 7.4 oz (100 kg)-222 lb 3.6 oz (100.8 kg)] 220 lb 7.4 oz (100 kg) (07/21 0654) Weight change: 3 lb 8.4 oz (1.598 kg) Last BM Date: 01/20/14 Intake/Output from previous day: -2181 (total since admit -6650) wt 220.7 down from 233 07/20 0701 - 07/21 0700 In: 420 [P.O.:420] Out: 2601 [Urine:2600; Stool:1] Intake/Output this shift:    PE: General:Pleasant affect, NAD Skin:Warm and dry, brisk capillary refill HEENT:normocephalic, sclera clear, mucus membranes moist Neck:supple, no JVD, sitting upright Heart:S1S2 RRR with 2/6 systolic murmur, no gallup, rub or click Lungs: with rales in bases, no rhonchi, or wheezes WJX:BJYN, non tender, + BS, do not palpate liver spleen or masses Ext:tr lower ext edema, still a little tight bil,  Neuro:alert and oriented, MAE, follows commands, + facial symmetry   Lab Results:  Recent Labs  01/20/14 0230 01/21/14 0552  WBC 7.0 8.1  HGB 9.5* 9.9*  HCT 30.6* 31.9*  PLT 299 301   BMET  Recent Labs  01/19/14 1831 01/21/14 0552  NA 134* 139  K 3.6* 3.2*  CL 91* 95*  CO2 27 31  GLUCOSE 180* 65*  BUN 46* 46*  CREATININE 2.58* 2.25*  CALCIUM 8.6 8.7   No results found for this basename: TROPONINI, CK, MB,  in the last 72 hours  Lab Results  Component Value Date   CHOL 160 05/26/2012   HDL 29* 05/26/2012   LDLCALC 115* 05/26/2012   TRIG 80 05/26/2012   CHOLHDL 5.5 05/26/2012   Lab Results  Component Value Date   HGBA1C 8.0* 01/16/2013     Lab Results  Component Value Date   TSH 0.653 01/17/2014     BNP (last 3 results)  Recent Labs  01/16/14 1314  PROBNP 2918.0*    Studies/Results: No results found.  Medications: I have reviewed the patient's current medications. Scheduled Meds: . amiodarone  100 mg Oral Daily  . amitriptyline  75 mg Oral QHS  . atorvastatin  40 mg Oral QPM  . benzonatate  200 mg Oral TID  . diltiazem  240 mg Oral Daily  . donepezil  10 mg Oral QHS  . ezetimibe  10 mg Oral QPM  . furosemide  40 mg Oral BID  . insulin aspart  0-15 Units Subcutaneous TID WC  . insulin aspart  0-5 Units Subcutaneous QHS  . insulin glargine  39 Units Subcutaneous BID  . levothyroxine  100 mcg Oral q morning - 10a  . metoprolol succinate  50 mg Oral Daily  . pantoprazole  40 mg Oral Daily  . potassium chloride  40 mEq Oral BID  . sodium chloride  3 mL Intravenous Q12H   Continuous Infusions:  PRN Meds:.sodium chloride, acetaminophen, ALPRAZolam, guaiFENesin-dextromethorphan, nitroGLYCERIN, ondansetron (ZOFRAN) IV, sodium chloride  Assessment/Plan: 1. Acute on chronic systolic heart failure-initially, aggressive IV diuresis with Lasix, concomitant Zaroxolyn. Baseline creatinine was approximately 2.2, increased to 2.6. diuresis was held. PO Lasix currently being utilized.  Resumed Lasix at  40 mg twice a day-yesterday. At home, she was on 80 mg twice a day-increased on the 14th of July to that dose. It is likely that she will need an increase in this medication. We have discussed the importance of salt restriction as well as fluid restriction. Continues with cough. 2. Chronic kidney disease stage III-slightly worse on this admission. She had been given a holiday from diuretics. We will go ahead and resume. Slow drift down of Cr. 3. Pacemaker-functioning well.  4. Chronic anticoagulation-currently on hold because of worsening anemia. One unit of blood administered. Given her anemia she is at  increased risk. Of course, she is at increased risk for sure without chronic anticoagulation. INR today 1.40  H/H 9.9/31.9-- will leave to Dr. Martinique 5. Coronary artery disease-prior 3 vessel CABG, old myocardial infarction.  6. Atrial fibrillation-amiodarone. Low-dose. Av pacing, and BiV pacing  7. Hypokalemia- now on K+ daily, did not receive any yesterday. Follow dialy 8. Anemia continues see above note      LOS: 5 days   Time spent with pt. :15 minutes. [facility] R  Nurse Practitioner Certified Pager [phone] or after 5pm and on weekends call [phone] 01/21/2014, 9:36 AM   Patient seen and examined and history reviewed. Agree with above findings and plan. Patient continues to improve. Less SOB and cough. Notes some discomfort in abdominal hernia with cough. Weight down to 220 lbs from 233. Renal function at baseline. Was just resumed on lasix orally yesterday. Her baseline weight is somewhere around 215-218. I think she will probably require more than 40 mg bid Lasix as she became volume overloaded quite quickly on this dose. Will increase to 60 mg bid. Replete potassium. She is making progress with PT. Hopefully DC home tomorrow if stable.   Peter Martinique, [place] 01/21/2014 11:16 AM

## 2014-01-22 ENCOUNTER — Telehealth: Payer: Self-pay | Admitting: Cardiology

## 2014-01-22 ENCOUNTER — Ambulatory Visit: Payer: Medicare Other | Admitting: Physician Assistant

## 2014-01-22 ENCOUNTER — Other Ambulatory Visit: Payer: Self-pay | Admitting: Physician Assistant

## 2014-01-22 DIAGNOSIS — E118 Type 2 diabetes mellitus with unspecified complications: Secondary | ICD-10-CM

## 2014-01-22 DIAGNOSIS — I5023 Acute on chronic systolic (congestive) heart failure: Secondary | ICD-10-CM

## 2014-01-22 LAB — GLUCOSE, CAPILLARY
Glucose-Capillary: 182 mg/dL — ABNORMAL HIGH (ref 70–99)
Glucose-Capillary: 172 mg/dL — ABNORMAL HIGH (ref 70–99)

## 2014-01-22 LAB — BASIC METABOLIC PANEL
ANION GAP: 15 (ref 5–15)
BUN: 47 mg/dL — ABNORMAL HIGH (ref 6–23)
CHLORIDE: 93 meq/L — AB (ref 96–112)
CO2: 27 mEq/L (ref 19–32)
Calcium: 8.6 mg/dL (ref 8.4–10.5)
Creatinine, Ser: 2.28 mg/dL — ABNORMAL HIGH (ref 0.50–1.10)
GFR calc Af Amer: 22 mL/min — ABNORMAL LOW (ref >90)
GFR calc non Af Amer: 19 mL/min — ABNORMAL LOW (ref >90)
Glucose, Bld: 226 mg/dL — ABNORMAL HIGH (ref 70–99)
POTASSIUM: 3.7 meq/L (ref 3.7–5.3)
SODIUM: 135 meq/L — AB (ref 137–147)

## 2014-01-22 LAB — CBC
HCT: 31.7 % — ABNORMAL LOW (ref 36.0–46.0)
HEMOGLOBIN: 9.9 g/dL — AB (ref 12.0–15.0)
MCH: 28.3 pg (ref 26.0–34.0)
MCHC: 31.2 g/dL (ref 30.0–36.0)
MCV: 90.6 fL (ref 78.0–100.0)
Platelets: 261 10*3/uL (ref 150–400)
RBC: 3.5 MIL/uL — AB (ref 3.87–5.11)
RDW: 18 % — ABNORMAL HIGH (ref 11.5–15.5)
WBC: 5.9 10*3/uL (ref 4.0–10.5)

## 2014-01-22 LAB — PROTIME-INR
INR: 1.22 (ref 0.00–1.49)
Prothrombin Time: 15.4 seconds — ABNORMAL HIGH (ref 11.6–15.2)

## 2014-01-22 LAB — PRO B NATRIURETIC PEPTIDE: PRO B NATRI PEPTIDE: 1491 pg/mL — AB (ref 0–450)

## 2014-01-22 MED ORDER — FUROSEMIDE 20 MG PO TABS: 60.0000 mg | ORAL_TABLET | Freq: Two times a day (BID) | ORAL | Status: DC

## 2014-01-22 MED ORDER — POTASSIUM CHLORIDE CRYS ER 20 MEQ PO TBCR: 40.0000 meq | EXTENDED_RELEASE_TABLET | Freq: Two times a day (BID) | ORAL | Status: AC

## 2014-01-22 MED ORDER — BENZONATATE 200 MG PO CAPS: 200.0000 mg | ORAL_CAPSULE | Freq: Three times a day (TID) | ORAL | Status: DC

## 2014-01-22 NOTE — Progress Notes (Signed)
Patient Name: [name] Date of Encounter: 01/22/2014     Active Problems:   Atrial fibrillation   Chronic anticoagulation   GI bleed   CKD (chronic kidney disease) stage 3, GFR 30-59 ml/min   Acute on chronic combined systolic and diastolic congestive heart failure, NYHA class 4   Coronary artery disease   Hx of CABG   Pacemaker   CHF (congestive heart failure)    SUBJECTIVE  Feeling good. Ready to go home.   CURRENT MEDS . amiodarone  100 mg Oral Daily  . amitriptyline  75 mg Oral QHS  . atorvastatin  40 mg Oral QPM  . benzonatate  200 mg Oral TID  . diltiazem  240 mg Oral Daily  . donepezil  10 mg Oral QHS  . ezetimibe  10 mg Oral QPM  . furosemide  60 mg Oral BID  . insulin aspart  0-15 Units Subcutaneous TID WC  . insulin aspart  0-5 Units Subcutaneous QHS  . insulin glargine  39 Units Subcutaneous BID  . levothyroxine  100 mcg Oral q morning - 10a  . metoprolol succinate  50 mg Oral Daily  . pantoprazole  40 mg Oral Daily  . potassium chloride  40 mEq Oral BID  . sodium chloride  3 mL Intravenous Q12H    OBJECTIVE  Filed Vitals:   01/21/14 1500 01/21/14 1717 01/21/14 2202 01/22/14 0622  BP: 125/66 129/56 133/51 141/62  Pulse: 74 73 69 74  Temp: 98 F (36.7 C) 98 F (36.7 C) 97.5 F (36.4 C) 97.9 F (36.6 C)  TempSrc: Oral Oral Oral Oral  Resp: 18 18 20 18   Height:      Weight:    217 lb 3.2 oz (98.521 kg)  SpO2: 97% 97% 97% 96%    Intake/Output Summary (Last 24 hours) at 01/22/14 0818 Last data filed at 01/22/14 0350  Gross per 24 hour  Intake    960 ml  Output   1270 ml  Net   -310 ml   Filed Weights   01/20/14 1856 01/21/14 0654 01/22/14 0622  Weight: 222 lb 3.6 oz (100.8 kg) 220 lb 7.4 oz (100 kg) 217 lb 3.2 oz (98.521 kg)    PHYSICAL EXAM  General appearance: A and O x 2, much happier today  OP: oropharynx moist without erythema  Resp: Decrease at bases.  Cardio: Reg paced 70s. Sternotomy  GI: Obese, soft, non-tender;  bowel sounds normal; no masses, no organomegaly  Extremities: trace Edema, Brawny   Accessory Clinical Findings  CBC  Recent Labs  01/21/14 0552 01/22/14 0429  WBC 8.1 5.9  HGB 9.9* 9.9*  HCT 31.9* 31.7*  MCV 87.9 90.6  PLT 301 462   Basic Metabolic Panel  Recent Labs  01/21/14 0552 01/22/14 0429  NA 139 135*  K 3.2* 3.7  CL 95* 93*  CO2 31 27  GLUCOSE 65* 226*  BUN 46* 47*  CREATININE 2.25* 2.28*  CALCIUM 8.7 8.6     TELE  AV paced  Radiology/Studies  Dg Chest Port 1 View  01/16/2014   CLINICAL DATA:  Weight gain.  Shortness of breath.  EXAM: PORTABLE CHEST - 1 VIEW  COMPARISON:  01/16/2013.  FINDINGS: Cardiomegaly with pulmonary vascular prominence and interstitial prominence with small right pleural effusion noted. He has fine consistent with congestive heart failure. Cardiac pacer with lead tips in right atrium right ventricle. No pneumothorax. No acute boney abnormality.  IMPRESSION: Congestive heart failure or interstitial edema.  ASSESSMENT AND PLAN  [name] is a 80 y.o. female with a history of chronic systolic CHF, DM, morbid obesity, prior CVA, afib s/p AV nodal ablation and PPM, CAD s/p CABG 2010, chronic anticoagulation, anemia and chronic kidney disease who was admitted on 01/16/14 with acute on chronic systolic heart failure and heme + stools.   Acute on chronic systolic heart failure- Upon admission: BNP 2,918. CRX demonstrates cardiomegaly with interstitial edema -- Last echo 06-11-13 demonstrated EF 40-45% (improved from 30% post CRT), mild to moderate AS, LA 51.  -- Initially, aggressive IV diuresis with Lasix, concomitant Zaroxolyn and reprogramming pacer -- Net neg 6.9L. Down 16lbs (233--> 217lbs). She is thought to be at her dry weight (217lbs). She is feeling well.  -- Baseline creatinine was approximately 2.2, increased to 2.6 and diuresis was held. Now continuing to trend downwards -- Currently on PO Lasix 60mg  BID. Will  continue this at home.  -- Continue Toprol XL 50mg . No ACE/ARB due to CKD -- The importance of salt restriction as well as fluid restriction has been discussed extensively   Chronic kidney disease stage III-slightly worse on this admission.  -- Baseline creatinine was approximately 2.2, increased to 2.6 and diuresis was held. Now continuing to trend downwards: Cr 2.25--> 2.66 -->2.58--> 2.25 --> 2.28 on PO Lasix.  Acute on chronic anemia- - No evidence acute GIB despite heme +.  -- On Aranesp per Heme. Given 2 Units PRBCs, feraheme, and Aranesp during hospitalization. Hbg 7.8 on admission and now stable at 9.9.  -- She is UTD on scopes and despite Heme + she is not acting like an acute GI Bleed. Probably will not need GI work up as she had EGD 01/19/13 and had gastric and duodenal polyp - nonbleeding; colon showed Diverticulosis. May need Capsule Endo if Counts not remaining stable.  -- Warfarin held because of worsening anemia  Atrial fibrillation- s/p AV nodal ablation and PPM. -- Since admission, she has been predominately AV pacing, but has periods of atrial fibrillation as well as atrial tachycardia. EP evaluated on 01/17/14. Her device was interrogated and this revealed that she has maintained sinus rhythm quite well since 1/15. Her device function was normal. Dr. Rayann Heman adjusted the upper tracking rate (decreased to 100 bpm) to minimize tracking of atrial tachycardia (130 bpm). Atrial sensitivity and LV pacing output were also adjusted. It was recommended that If she has further afib, amiodarone could be increased to 200mg  daily. -- Continue low dose amiodarone 100mg  qd and Cardizem 240mg  -- Warfarin held due to worsening anemia and heme + stools.  INR today 1.22. Chad2vasc score is at least 9 -- Pacemaker functioning well.  Hypokalemia- resolved. K as low as 2.8. Now 3.7 after repletion. Now on K+ daily.  Coronary artery disease-prior 3 vessel CABG 2010: (LIMA-LAD, SVG-diag, SVG-PDA) --  Continue statin and BB.  -- Troponin neg x3  HLD- continue statin and zetia  Prior CVA 04/2003. RF Reduce.   Dispo- will likely go home today. PT recommends home health PT/OT   Signed, Perry Mount PA-C  Pager [phone]  Patient seen and examined and history reviewed. Agree with above findings and plan. Patient is doing well. Weight is down. Edema is markedly improved. I think she is stable to be DC today. Continue lasix 60 mg bid. Will resume coumadin with target INR 2-2.5 (low therapeutic range). Home health PT/OT/RN. Follow up in our office in 2 weeks. Needs to weigh daily.    [name], [place]  01/22/2014 10:19 AM

## 2014-01-22 NOTE — Discharge Summary (Signed)
Discharge Summary   Patient ID: [name] MRN: [id number], DOB/AGE: 11-May-1933 80 y.o. Admit date: 01/16/2014 D/C date:     01/22/2014  Primary Cardiologist: Dr. Donneta Romberg Dr. Lovena Le   Principal Problem:   Acute on chronic combined systolic and diastolic congestive heart failure, NYHA class 4 Active Problems:   CAD (coronary artery disease)   Anemia associated with chronic renal failure   Atrial tachycardia   GI bleed   CKD (chronic kidney disease) stage 3, GFR 30-59 ml/min   Cardiomyopathy-cause unknown ? rate related v ischemic   Atrial fibrillation with RVR   Coronary artery disease   Hx of CABG   Diabetes mellitus type 2 with complications   Acute on chronic renal failure   Pacemaker    Admission Dates: 01/16/14-01/22/14 Discharge Diagnosis: Acute on chronic systolic CHF- discharge weight 217 lbs.  HPI: [name] is a 80 y.o. female with a history of chronic systolic CHF, DM, morbid obesity, prior CVA, afib s/p AV nodal ablation and PPM, CAD s/p CABG 2010, chronic anticoagulation, previous GI bleed, anemia and chronic kidney disease who was admitted on 01/16/14 with acute on chronic systolic heart failure and heme + stools.  She was recently seen in clinic by Dr. Martinique on 01/14/14, when she presented with complaints of increased SOB, orthopnea, chest pressure, marked increase in edema and weight gain. She was felt to be in a/c CHF. EKG also demonstrated atrial fibrillation with a ventricular response of 94 bpm. Dr. Martinique instructed her to increase her lasix to 80 mg BID. He instructed her to check weight daily and to avoid sodium intake. He ordered a f/u BMP and BNP, to be done in 7 days. She was instructed to report to the ER if her symptoms worsened.  Unfortunately, her symptoms did not improve. She continued to have DOE, orthopnea, LEE, weight gain and increased abdominal girth. She also noted feeling palpitations.  Se reported melena and rectal bleeding  periodically over the last several weeks and has had to wear Depends. Review of her records reveal that she has a history of GI bleed in 2013. Endoscopy at that time revealed a hiatal hernia. Colonoscopy revealed diverticular bleeding as well as internal/external hemorrhoids.   Hospital Course: She was admitted for IV diuresis and further work up.  Acute on chronic systolic heart failure- Upon admission: BNP 2,918. CRX demonstrates cardiomegaly with interstitial edema  -- Last echo 06-11-13 demonstrated EF 40-45% (improved from 30% post CRT), mild to moderate AS, LA 51.  -- Initially, aggressive IV diuresis with Lasix, concomitant Zaroxolyn and reprogramming pacer  -- Net neg 6.9L. Down 16lbs (233--> 217lbs). She is thought to be at her dry weight (217lbs). She is feeling well.  -- Baseline creatinine was approximately 2.2, increased to 2.6 and diuresis was held. Now continuing to trend downwards  -- Currently on PO Lasix 60mg  BID. Will continue this at home.  -- Continue Toprol XL 50mg . No ACE/ARB due to CKD  -- The importance of salt restriction as well as fluid restriction has been discussed extensively   Chronic kidney disease stage III-slightly worse on this admission.  -- Baseline creatinine was approximately 2.2, increased to 2.6 and diuresis was held. Now continuing to trend downwards: Cr 2.25--> 2.66 -->2.58--> 2.25 --> 2.28 on PO Lasix.   Acute on chronic anemia- - No evidence acute GIB despite heme + stool. -- On Aranesp per Heme. Given 2 Units PRBCs, feraheme, and Aranesp during hospitalization. Hbg 7.8 on  admission and now stable at 9.9.  -- She is UTD on scopes and despite Heme + she is not acting like an acute GI Bleed. Probably will not need GI work up as she had EGD 01/19/13 and had gastric and duodenal polyp - nonbleeding; colon showed Diverticulosis. May need Capsule Endo if Counts not remaining stable.  -- Warfarin held because of worsening anemia. This will be resumed at  discharge due to high risk for stroke.  Atrial fibrillation- s/p AV nodal ablation and PPM.  -- Since admission, she has been predominately AV pacing, but has periods of atrial fibrillation as well as atrial tachycardia. EP evaluated on 01/17/14. Her device was interrogated and this revealed that she has maintained sinus rhythm quite well since 1/15. Her device function was normal. Dr. Rayann Heman adjusted the upper tracking rate (decreased to 100 bpm) to minimize tracking of atrial tachycardia (130 bpm). Atrial sensitivity and LV pacing output were also adjusted. It was recommended that If she has further afib, amiodarone could be increased to 200mg  daily.  -- Continue low dose amiodarone 100mg  qd and Cardizem 240mg   -- Warfarin held due to worsening anemia and heme + stools. INR today 1.22. Chad2vasc score is at least 9. Will restart coumadin at home dose and have INR checked on Friday. Target INR 2-2.5 (low therapeutic range). -- Pacemaker functioning well.   Hypokalemia- resolved. K as low as 2.8. Now 3.7 after repletion. Now on K+ daily. Kdur 40 mEq BID -- BMET 1 week  Coronary artery disease-prior 3 vessel CABG 2010: (LIMA-LAD, SVG-diag, SVG-PDA)  -- Continue statin and BB.  -- Troponin neg x3   HLD- continue statin and zetia   Prior CVA 04/2003. RF Reduce. Will resume coumadin.   Dispo- will likely go home today. PT recommends home health PT/OT/RN  The patient has had an uncomplicated hospital course and is recovering well. She has been seen by Dr. Martinique today and deemed ready for discharge home with home PT/RN/OT. All follow-up appointments have been scheduled. Discharge medications are listed below. Will restart coumadin at home dose and have INR checked on Friday. Target INR 2-2.5 (low therapeutic range). She will be discharged on Lasix 60mg  BID and Kdur 40 mEq BID with BMET in 1 week.    Discharge Vitals: Blood pressure 141/62, pulse 74, temperature 97.9 F (36.6 C), temperature source  Oral, resp. rate 18, height 5\' 3"  (1.6 m), weight 217 lb 3.2 oz (98.521 kg), SpO2 96.00%.  Labs: Lab Results  Component Value Date   WBC 5.9 01/22/2014   HGB 9.9* 01/22/2014   HCT 31.7* 01/22/2014   MCV 90.6 01/22/2014   PLT 261 01/22/2014     Recent Labs Lab 01/22/14 0429  NA 135*  K 3.7  CL 93*  CO2 27  BUN 47*  CREATININE 2.28*  CALCIUM 8.6  GLUCOSE 226*     Diagnostic Studies/Procedures   Dg Chest Port 1 View  01/16/2014   CLINICAL DATA:  Weight gain.  Shortness of breath.  EXAM: PORTABLE CHEST - 1 VIEW  COMPARISON:  01/16/2013.  FINDINGS: Cardiomegaly with pulmonary vascular prominence and interstitial prominence with small right pleural effusion noted. He has fine consistent with congestive heart failure. Cardiac pacer with lead tips in right atrium right ventricle. No pneumothorax. No acute boney abnormality.  IMPRESSION: Congestive heart failure or interstitial edema.      Discharge Medications     Medication List         amiodarone 200 MG tablet  Commonly known as:  PACERONE  Take 100 mg by mouth daily.     amitriptyline 25 MG tablet  Commonly known as:  ELAVIL  Take 75 mg by mouth at bedtime.     atorvastatin 40 MG tablet  Commonly known as:  LIPITOR  Take 40 mg by mouth every evening.     benzonatate 200 MG capsule  Commonly known as:  TESSALON  Take 1 capsule (200 mg total) by mouth 3 (three) times daily.     diltiazem 240 MG 24 hr capsule  Commonly known as:  CARDIZEM CD  Take 240 mg by mouth daily.     donepezil 10 MG tablet  Commonly known as:  ARICEPT  Take 10 mg by mouth at bedtime.     esomeprazole 40 MG capsule  Commonly known as:  NEXIUM  Take 40 mg by mouth daily before breakfast.     ezetimibe 10 MG tablet  Commonly known as:  ZETIA  Take 10 mg by mouth every evening.     furosemide 20 MG tablet  Commonly known as:  LASIX  Take 3 tablets (60 mg total) by mouth 2 (two) times daily.     ICAPS AREDS FORMULA PO  Take 1 tablet by  mouth 2 (two) times daily.     insulin glargine 100 UNIT/ML injection  Commonly known as:  LANTUS  Inject 50 Units into the skin 2 (two) times daily.     insulin lispro 100 UNIT/ML injection  Commonly known as:  HUMALOG  - Inject 20-25 Units into the skin 2 (two) times daily as needed for high blood sugar. Home sliding scale  - If blood sugar is <200 take 20 units  - If blood sugar is >200 take 25 units  - If blood sugar is less than 70, don't take any     levothyroxine 100 MCG tablet  Commonly known as:  SYNTHROID, LEVOTHROID  Take 100 mcg by mouth every morning.     metoprolol succinate 50 MG 24 hr tablet  Commonly known as:  TOPROL-XL  Take 50 mg by mouth daily. Take with or immediately following a meal.     nitroGLYCERIN 0.4 MG SL tablet  Commonly known as:  NITROSTAT  Place 0.4 mg under the tongue every 5 (five) minutes as needed. For chest pain     potassium chloride SA 20 MEQ tablet  Commonly known as:  K-DUR,KLOR-CON  Take 2 tablets (40 mEq total) by mouth 2 (two) times daily.     warfarin 5 MG tablet  Commonly known as:  COUMADIN  Take 7.5-10 mg by mouth daily. Take 10mg  by mouth on Sunday, Monday, Wednesday, Friday and Saturday. Take 7.5mg  by mouth on Tuesday and Thursday.        Disposition   The patient will be discharged in stable condition to home.  Follow-up Information   Follow up with Lyda Jester, PA-C On 02/05/2014. (@ 10 am. )    Specialty:  Cardiology   Contact information:   [address] [place] [address] [phone]       Follow up with [facility] On 01/29/2014. ([facility] on first floor between [place] for blood work )    Specialty:  Cardiology   Contact information:   [address] [place] [address] [phone]      Follow up with [facility] On 01/24/2014. (Coumadin clinic 10am )    Contact information:   [place]  [address] [place]  [address] [phone]        Duration of Discharge Encounter: Greater than 30 minutes including physician and PA time.  SignedVertell Limber, KATHRYN PA-C 01/22/2014, 10:40 AM

## 2014-01-22 NOTE — Progress Notes (Signed)
Subjective: Admitted July 16 c Vol Overload, CHF = Rxed c IV Lasix, Metolazone and reprogramming Pacer Also Sxatic Anemia - No evidence acute GIB despite heme +.  She is S/P 2 Unit PRBC and current Hbg is stable @ 9.9 Her volume and weight are down but cr up to @ 2.6 and now down to 2.25/2.28 No Resp issues.   She did well c PT  She feels well and is near baseline  Probable D/c today   Objective: Vital signs in last 24 hours: Temp:  [97.5 F (36.4 C)-98 F (36.7 C)] 97.9 F (36.6 C) (07/22 0622) Pulse Rate:  [69-77] 74 (07/22 0622) Resp:  [18-20] 18 (07/22 0622) BP: (125-141)/(51-66) 141/62 mmHg (07/22 0622) SpO2:  [96 %-97 %] 96 % (07/22 0622) Weight:  [98.521 kg (217 lb 3.2 oz)] 98.521 kg (217 lb 3.2 oz) (07/22 0622) Weight change: -2.279 kg (-5 lb 0.4 oz) Last BM Date: 01/21/14  CBG (last 3)   Recent Labs  01/21/14 1648 01/21/14 2154 01/22/14 0637  GLUCAP 220* 290* 182*    Intake/Output from previous day:  Intake/Output Summary (Last 24 hours) at 01/22/14 0750 Last data filed at 01/22/14 0350  Gross per 24 hour  Intake    960 ml  Output   1270 ml  Net   -310 ml   07/21 0701 - 07/22 0700 In: 960 [P.O.:960] Out: 1270 [Urine:1270]   Physical Exam  General appearance: A and O x 2, much happier today OP: oropharynx moist without erythema Resp: Decrease at bases.   Cardio: Reg paced 70s. Sternotomy GI: Obese, soft, non-tender; bowel sounds normal; no masses,  no organomegaly Extremities: trace Edema, Brawny  Lab Results:  Recent Labs  01/21/14 0552 01/22/14 0429  NA 139 135*  K 3.2* 3.7  CL 95* 93*  CO2 31 27  GLUCOSE 65* 226*  BUN 46* 47*  CREATININE 2.25* 2.28*  CALCIUM 8.7 8.6    No results found for this basename: AST, ALT, ALKPHOS, BILITOT, PROT, ALBUMIN,  in the last 72 hours   Recent Labs  01/21/14 0552 01/22/14 0429  WBC 8.1 5.9  HGB 9.9* 9.9*  HCT 31.9* 31.7*  MCV 87.9 90.6  PLT 301 261    Lab Results  Component Value  Date   INR 1.22 01/22/2014   INR 1.40 01/21/2014   INR 1.56* 01/20/2014    No results found for this basename: CKTOTAL, CKMB, CKMBINDEX, TROPONINI,  in the last 72 hours  No results found for this basename: TSH, T4TOTAL, FREET3, T3FREE, THYROIDAB,  in the last 72 hours  No results found for this basename: VITAMINB12, FOLATE, FERRITIN, TIBC, IRON, RETICCTPCT,  in the last 72 hours  Micro Results: Recent Results (from the past 240 hour(s))  MRSA PCR SCREENING     Status: None   Collection Time    01/16/14  9:59 PM      Result Value Ref Range Status   MRSA by PCR NEGATIVE  NEGATIVE Final   Comment:            The GeneXpert MRSA Assay (FDA     approved for NASAL specimens     only), is one component of a     comprehensive MRSA colonization     surveillance program. It is not     intended to diagnose MRSA     infection nor to guide or     monitor treatment for     MRSA infections.     Studies/Results: No  results found.   Medications: Scheduled: . amiodarone  100 mg Oral Daily  . amitriptyline  75 mg Oral QHS  . atorvastatin  40 mg Oral QPM  . benzonatate  200 mg Oral TID  . diltiazem  240 mg Oral Daily  . donepezil  10 mg Oral QHS  . ezetimibe  10 mg Oral QPM  . furosemide  60 mg Oral BID  . insulin aspart  0-15 Units Subcutaneous TID WC  . insulin aspart  0-5 Units Subcutaneous QHS  . insulin glargine  39 Units Subcutaneous BID  . levothyroxine  100 mcg Oral q morning - 10a  . metoprolol succinate  50 mg Oral Daily  . pantoprazole  40 mg Oral Daily  . potassium chloride  40 mEq Oral BID  . sodium chloride  3 mL Intravenous Q12H   Continuous:    Assessment/Plan: Active Problems:   Atrial fibrillation   Chronic anticoagulation   GI bleed   CKD (chronic kidney disease) stage 3, GFR 30-59 ml/min   Acute on chronic combined systolic and diastolic congestive heart failure, NYHA class 4   Coronary artery disease   Hx of CABG   Pacemaker   CHF (congestive heart  failure)  CHF (congestive heart failure) - Acute on chronic CHF - Vol Overload - S/P Sig Diuresis. OK to D/c on PO Lasix. Cr improved to 2.25/2.28 and can be monitored as outpt.  She would be poor HD candidate in future.  She is down 16#s since July 14 and 7/17.  233.4# to 217 today.  She is at her "dry weight" today.  She did have a 208# wt in June c Dr Martinique  Afib/CHF/Pacer - biventricular pacemaker/CAD/3v CABG/Prior MI. Per cards. On meds. Device interogated and Dr Joylene Grapes adjusted settings.  AFlutter - Amio   CKDz stage 4 c baseline Crs 1.8 - 2.1.  Follow Cr c Lasix usage.  Cr 2.25-2.66 -2.58- 2.25 - 2.28.  K is fine  Chronic Anticoagulation - Goal INR 2.0 - 2.5.  INR 1.22 today. Coumadin options per cards.  Anemia of Chronic Dz and CKD on Aranesp per Heme.  Given 2 Units PRBCs, feraheme, and Aranesp during hospitalization.  Hbg 7.8 on admission and now stable at 9.9.  She is UTD on scopes and despite Heme + she is not acting like an acute GI Bleed. Probably will not need GI W/up as she had EGD 01/19/13 and had Gastric and Duodenal Polyp - nonbleeding; Colon showed Diverticulosis.  May need Capsule Endo if Counts not remaining stable.     AFTT - Chronic and family helps in her care.  PT = Home health PT;Supervision/Assistance - 24 hour and OT= Home health OT. Now on floor and may go home today  Morbid Obesity. Desperately needs weight loss (not just fluid pulled out).   Prior CVA 04/2003. RF Reduce.   GERD - Stay On PPI   Lipids - Lipitor and Zetia   HTN - BP fine.   Hypothyroid - on meds. TSH 0.653  DM2 - CBGs higher.  Insulin Requiring. Titrate Insulin as outpt. Continue to adjust as needed.  -   Recent Labs  01/21/14 1648 01/21/14 2154 01/22/14 0637  GLUCAP 220* 290* 182*      LOS: 6 days   Cohen Doleman M 01/22/2014, 7:50 AM

## 2014-01-22 NOTE — Discharge Summary (Signed)
Patient seen and examined and history reviewed. Agree with above findings and plan. See earlier rounding note.  Peter Martinique, [employer] 01/22/2014 2:02 PM

## 2014-01-22 NOTE — Discharge Instructions (Signed)
Heart Failure  Heart failure is a condition in which the heart has trouble pumping blood. This means your heart does not pump blood efficiently for your body to work well. In some cases of heart failure, fluid may back up into your lungs or you may have swelling (edema) in your lower legs. Heart failure is usually a long-term (chronic) condition. It is important for you to take good care of yourself and follow your caregiver's treatment plan.  CAUSES   Some health conditions can cause heart failure. Those health conditions include:  · High blood pressure (hypertension) causes the heart muscle to work harder than normal. When pressure in the blood vessels is high, the heart needs to pump (contract) with more force in order to circulate blood throughout the body. High blood pressure eventually causes the heart to become stiff and weak.  · Coronary artery disease (CAD) is the buildup of cholesterol and fat (plaque) in the arteries of the heart. The blockage in the arteries deprives the heart muscle of oxygen and blood. This can cause chest pain and may lead to a heart attack. High blood pressure can also contribute to CAD.  · Heart attack (myocardial infarction) occurs when 1 or more arteries in the heart become blocked. The loss of oxygen damages the muscle tissue of the heart. When this happens, part of the heart muscle dies. The injured tissue does not contract as well and weakens the heart's ability to pump blood.  · Abnormal heart valves can cause heart failure when the heart valves do not open and close properly. This makes the heart muscle pump harder to keep the blood flowing.  · Heart muscle disease (cardiomyopathy or myocarditis) is damage to the heart muscle from a variety of causes. These can include drug or alcohol abuse, infections, or unknown reasons. These can increase the risk of heart failure.  · Lung disease makes the heart work harder because the lungs do not work properly. This can cause a strain  on the heart, leading it to fail.  · Diabetes increases the risk of heart failure. High blood sugar contributes to high fat (lipid) levels in the blood. Diabetes can also cause slow damage to tiny blood vessels that carry important nutrients to the heart muscle. When the heart does not get enough oxygen and food, it can cause the heart to become weak and stiff. This leads to a heart that does not contract efficiently.  · Other conditions can contribute to heart failure. These include abnormal heart rhythms, thyroid problems, and low blood counts (anemia).  Certain unhealthy behaviors can increase the risk of heart failure. Those unhealthy behaviors include:  · Being overweight.  · Smoking or chewing tobacco.  · Eating foods high in fat and cholesterol.  · Abusing illicit drugs or alcohol.  · Lacking physical activity.  SYMPTOMS   Heart failure symptoms may vary and can be hard to detect. Symptoms may include:  · Shortness of breath with activity, such as climbing stairs.  · Persistent cough.  · Swelling of the feet, ankles, legs, or abdomen.  · Unexplained weight gain.  · Difficulty breathing when lying flat (orthopnea).  · Waking from sleep because of the need to sit up and get more air.  · Rapid heartbeat.  · Fatigue and loss of energy.  · Feeling lightheaded, dizzy, or close to fainting.  · Loss of appetite.  · Nausea.  · Increased urination during the night (nocturia).  DIAGNOSIS   A diagnosis   of heart failure is based on your history, symptoms, physical examination, and diagnostic tests.  Diagnostic tests for heart failure may include:  · Echocardiography.  · Electrocardiography.  · Chest X-ray.  · Blood tests.  · Exercise stress test.  · Cardiac angiography.  · Radionuclide scans.  TREATMENT   Treatment is aimed at managing the symptoms of heart failure. Medicines, behavioral changes, or surgical intervention may be necessary to treat heart failure.  · Medicines to help treat heart failure may  include:  ¨ Angiotensin-converting enzyme (ACE) inhibitors. This type of medicine blocks the effects of a blood protein called angiotensin-converting enzyme. ACE inhibitors relax (dilate) the blood vessels and help lower blood pressure.  ¨ Angiotensin receptor blockers. This type of medicine blocks the actions of a blood protein called angiotensin. Angiotensin receptor blockers dilate the blood vessels and help lower blood pressure.  ¨ Water pills (diuretics). Diuretics cause the kidneys to remove salt and water from the blood. The extra fluid is removed through urination. This loss of extra fluid lowers the volume of blood the heart pumps.  ¨ Beta blockers. These prevent the heart from beating too fast and improve heart muscle strength.  ¨ Digitalis. This increases the force of the heartbeat.  · Healthy behavior changes include:  ¨ Obtaining and maintaining a healthy weight.  ¨ Stopping smoking or chewing tobacco.  ¨ Eating heart healthy foods.  ¨ Limiting or avoiding alcohol.  ¨ Stopping illicit drug use.  ¨ Physical activity as directed by your caregiver.  · Surgical treatment for heart failure may include:  ¨ A procedure to open blocked arteries, repair damaged heart valves, or remove damaged heart muscle tissue.  ¨ A pacemaker to improve heart muscle function and control certain abnormal heart rhythms.  ¨ An internal cardioverter defibrillator to treat certain serious abnormal heart rhythms.  ¨ A left ventricular assist device to assist the pumping ability of the heart.  HOME CARE INSTRUCTIONS   · Take your medicine as directed by your caregiver. Medicines are important in reducing the workload of your heart, slowing the progression of heart failure, and improving your symptoms.  ¨ Do not stop taking your medicine unless directed by your caregiver.  ¨ Do not skip any dose of medicine.  ¨ Refill your prescriptions before you run out of medicine. Your medicines are needed every day.  ¨ Take over-the-counter  medicine only as directed by your caregiver or pharmacist.  · Engage in moderate physical activity if directed by your caregiver. Moderate physical activity can benefit some people. The elderly and people with severe heart failure should consult with a caregiver for physical activity recommendations.  · Eat heart healthy foods. Food choices should be free of trans fat and low in saturated fat, cholesterol, and salt (sodium). Healthy choices include fresh or frozen fruits and vegetables, fish, lean meats, legumes, fat-free or low-fat dairy products, and whole grain or high fiber foods. Talk to a dietitian to learn more about heart healthy foods.  · Limit sodium if directed by your caregiver. Sodium restriction may reduce symptoms of heart failure in some people. Talk to a dietitian to learn more about heart healthy seasonings.  · Use healthy cooking methods. Healthy cooking methods include roasting, grilling, broiling, baking, poaching, steaming, or stir-frying. Talk to a dietitian to learn more about healthy cooking methods.  · Limit fluids if directed by your caregiver. Fluid restriction may reduce symptoms of heart failure in some people.  · Weigh yourself every day.   Daily weights are important in the early recognition of excess fluid. You should weigh yourself every morning after you urinate and before you eat breakfast. Wear the same amount of clothing each time you weigh yourself. Record your daily weight. Provide your caregiver with your weight record.  · Monitor and record your blood pressure if directed by your caregiver.  · Check your pulse if directed by your caregiver.  · Lose weight if directed by your caregiver. Weight loss may reduce symptoms of heart failure in some people.  · Stop smoking or chewing tobacco. Nicotine makes your heart work harder by causing your blood vessels to constrict. Do not use nicotine gum or patches before talking to your caregiver.  · Schedule and attend follow-up visits as  directed by your caregiver. It is important to keep all your appointments.  · Limit alcohol intake to no more than 1 drink per day for nonpregnant women and 2 drinks per day for men. Drinking more than that is harmful to your heart. Tell your caregiver if you drink alcohol several times a week. Talk with your caregiver about whether alcohol is safe for you. If your heart has already been damaged by alcohol or you have severe heart failure, drinking alcohol should be stopped completely.  · Stop illicit drug use.  · Stay up-to-date with immunizations. It is especially important to prevent respiratory infections through current pneumococcal and influenza immunizations.  · Manage other health conditions such as hypertension, diabetes, thyroid disease, or abnormal heart rhythms as directed by your caregiver.  · Learn to manage stress.  · Plan rest periods when fatigued.  · Learn strategies to manage high temperatures. If the weather is extremely hot:  ¨ Avoid vigorous physical activity.  ¨ Use air conditioning or fans or seek a cooler location.  ¨ Avoid caffeine and alcohol.  ¨ Wear loose-fitting, lightweight, and light-colored clothing.  · Learn strategies to manage cold temperatures. If the weather is extremely cold:  ¨ Avoid vigorous physical activity.  ¨ Layer clothes.  ¨ Wear mittens or gloves, a hat, and a scarf when going outside.  ¨ Avoid alcohol.  · Obtain ongoing education and support as needed.  · Participate or seek rehabilitation as needed to maintain or improve independence and quality of life.  SEEK MEDICAL CARE IF:   · Your weight increases by 03 lb/1.4 kg in 1 day or 05 lb/2.3 kg in a week.  · You have increasing shortness of breath that is unusual for you.  · You are unable to participate in your usual physical activities.  · You tire easily.  · You cough more than normal, especially with physical activity.  · You have any or more swelling in areas such as your hands, feet, ankles, or abdomen.  · You  are unable to sleep because it is hard to breathe.  · You feel like your heart is beating fast (palpitations).  · You become dizzy or lightheaded upon standing up.  SEEK IMMEDIATE MEDICAL CARE IF:   · You have difficulty breathing.  · There is a change in mental status such as decreased alertness or difficulty with concentration.  · You have a pain or discomfort in your chest.  · You have an episode of fainting (syncope).  MAKE SURE YOU:   · Understand these instructions.  · Will watch your condition.  · Will get help right away if you are not doing well or get worse.  Document Released: 06/20/2005 Document Revised: 10/15/2012   Document Reviewed: 07/12/2012  Exit[employer]® Patient Information ©2015 [employer]. This information is not intended to replace advice given to you by your health care provider. Make sure you discuss any questions you have with your health care provider.

## 2014-01-22 NOTE — Progress Notes (Signed)
Discharge instrucitons given to pt and spouse BOth understand. Wheeled by Nt

## 2014-01-22 NOTE — Telephone Encounter (Signed)
TCM Phone Call.. [phone]

## 2014-01-23 ENCOUNTER — Other Ambulatory Visit (HOSPITAL_BASED_OUTPATIENT_CLINIC_OR_DEPARTMENT_OTHER): Payer: Medicare Other

## 2014-01-23 ENCOUNTER — Ambulatory Visit (HOSPITAL_BASED_OUTPATIENT_CLINIC_OR_DEPARTMENT_OTHER): Payer: Medicare Other | Admitting: Internal Medicine

## 2014-01-23 ENCOUNTER — Ambulatory Visit (HOSPITAL_BASED_OUTPATIENT_CLINIC_OR_DEPARTMENT_OTHER): Payer: Medicare Other

## 2014-01-23 ENCOUNTER — Telehealth: Payer: Self-pay | Admitting: Internal Medicine

## 2014-01-23 ENCOUNTER — Other Ambulatory Visit: Payer: Self-pay | Admitting: Internal Medicine

## 2014-01-23 ENCOUNTER — Telehealth: Payer: Self-pay | Admitting: Cardiology

## 2014-01-23 VITALS — BP 129/60 | HR 70 | Temp 98.0°F | Resp 21 | Ht 63.0 in | Wt 224.7 lb

## 2014-01-23 DIAGNOSIS — D649 Anemia, unspecified: Secondary | ICD-10-CM

## 2014-01-23 DIAGNOSIS — I4891 Unspecified atrial fibrillation: Secondary | ICD-10-CM

## 2014-01-23 DIAGNOSIS — N039 Chronic nephritic syndrome with unspecified morphologic changes: Secondary | ICD-10-CM

## 2014-01-23 DIAGNOSIS — D631 Anemia in chronic kidney disease: Secondary | ICD-10-CM

## 2014-01-23 DIAGNOSIS — N189 Chronic kidney disease, unspecified: Secondary | ICD-10-CM

## 2014-01-23 DIAGNOSIS — D472 Monoclonal gammopathy: Secondary | ICD-10-CM

## 2014-01-23 DIAGNOSIS — I2510 Atherosclerotic heart disease of native coronary artery without angina pectoris: Secondary | ICD-10-CM

## 2014-01-23 LAB — CBC WITH DIFFERENTIAL/PLATELET
BASO%: 0.8 % (ref 0.0–2.0)
BASOS ABS: 0.1 10*3/uL (ref 0.0–0.1)
EOS%: 1.4 % (ref 0.0–7.0)
Eosinophils Absolute: 0.1 10*3/uL (ref 0.0–0.5)
HEMATOCRIT: 32.8 % — AB (ref 34.8–46.6)
HGB: 10.3 g/dL — ABNORMAL LOW (ref 11.6–15.9)
LYMPH#: 1.1 10*3/uL (ref 0.9–3.3)
LYMPH%: 15.7 % (ref 14.0–49.7)
MCH: 27.5 pg (ref 25.1–34.0)
MCHC: 31.5 g/dL (ref 31.5–36.0)
MCV: 87.1 fL (ref 79.5–101.0)
MONO#: 1.2 10*3/uL — AB (ref 0.1–0.9)
MONO%: 17.9 % — ABNORMAL HIGH (ref 0.0–14.0)
NEUT#: 4.3 10*3/uL (ref 1.5–6.5)
NEUT%: 64.2 % (ref 38.4–76.8)
Platelets: 301 10*3/uL (ref 145–400)
RBC: 3.76 10*6/uL (ref 3.70–5.45)
RDW: 17.5 % — ABNORMAL HIGH (ref 11.2–14.5)
WBC: 6.8 10*3/uL (ref 3.9–10.3)

## 2014-01-23 LAB — COMPREHENSIVE METABOLIC PANEL (CC13)
ALK PHOS: 127 U/L (ref 40–150)
ALT: 19 U/L (ref 0–55)
ANION GAP: 8 meq/L (ref 3–11)
AST: 30 U/L (ref 5–34)
Albumin: 3 g/dL — ABNORMAL LOW (ref 3.5–5.0)
BILIRUBIN TOTAL: 0.47 mg/dL (ref 0.20–1.20)
BUN: 47.5 mg/dL — AB (ref 7.0–26.0)
CO2: 33 mEq/L — ABNORMAL HIGH (ref 22–29)
Calcium: 9.1 mg/dL (ref 8.4–10.4)
Chloride: 99 mEq/L (ref 98–109)
Creatinine: 2.3 mg/dL — ABNORMAL HIGH (ref 0.6–1.1)
GLUCOSE: 108 mg/dL (ref 70–140)
Potassium: 3.6 mEq/L (ref 3.5–5.1)
Sodium: 140 mEq/L (ref 136–145)
Total Protein: 7.6 g/dL (ref 6.4–8.3)

## 2014-01-23 LAB — LACTATE DEHYDROGENASE (CC13): LDH: 300 U/L — AB (ref 125–245)

## 2014-01-23 MED ORDER — DARBEPOETIN ALFA-POLYSORBATE 200 MCG/0.4ML IJ SOLN
200.0000 ug | Freq: Once | INTRAMUSCULAR | Status: AC
  Administered 2014-01-23: 200 ug via SUBCUTANEOUS
  Filled 2014-01-23: qty 0.4

## 2014-01-23 NOTE — Telephone Encounter (Signed)
TCM call, pt without complaints taking things slowly.  To see [place], [place] on 02/05/14

## 2014-01-23 NOTE — Telephone Encounter (Signed)
gv and printed appt sched and avs for pt for July and Aug °

## 2014-01-23 NOTE — Progress Notes (Signed)
[facility] OFFICE PROGRESS NOTE  Natasha Reel, MD [place] [address]  DIAGNOSIS: MGUS (monoclonal gammopathy of unknown significance)  Symptomatic anemia  Anemia associated with chronic renal failure, unspecified stage - Plan: CBC with Differential, CBC with Differential, Basic metabolic panel (Bmet) - [place]  Chief Complaint  Patient presents with  . MGUS (monoclonal gammopathy of unknown significance)    CURRENT THERAPY: Aranesp 200 mcg subcutaneous every two weeks for a hemoglobin less than 11.    INTERVAL HISTORY: [name] 80 y.o. female with a history of IgG lambda MGUS, Anemia associated with chronic renal failure who presents for follow up. Today, she is accompanied by her husband. She was last seen by me on 12/26/2013. She reports being admitted to [facility] for CHF from 07/15 and being discharged on 07/22.  She weighed 233  On admission and 217 lbs on discharge.  Her lasix was increased.  During this admission, she also received 3 units of packed RBCs (Hgb of 7.8 on admission, 9.9 on discharge).  She denies rectal bleeding. She is compliant to anticoagulation.  She continues aranesp 200 mcg subcutaneous every two weeks for a  hemogloblin less than 11. She denies fevers of chills or acute shortness of breath. She also denies chest pain.  MEDICAL HISTORY: Past Medical History  Diagnosis Date  . Coronary artery disease     a. Severe two vessel; s/p CABG (LIMA-LAD, SVG-diag, SVG-PDA);  b. 05/2012 NSTEMI in setting of rapid aflutter;  c. 06/2012 Lexiscan cardiolite: small, mild reversible defect in lateral wall->mild ischemia, low-risk->med Rx.  Marland Kitchen Hypertension   . Type II diabetes mellitus   . Hyperlipidemia   . Diabetic neuropathy   . CVA (cerebral vascular accident) 2004  . Hypothyroidism   . Chronic anemia   . GERD (gastroesophageal reflux disease)   . Arthritis   . Memory loss, short term   . COPD (chronic obstructive pulmonary  disease)   . Blood transfusion without reported diagnosis   . Chronic combined systolic and diastolic CHF (congestive heart failure)     a. 07/2012 Echo: EF 30%, Gr II DD, Mild AS/MR, Mod-Sev TR, mild bi-atrial and RV dil, PASP 71mmHg.  . Moderate mitral regurgitation     a. mod by TEE 2012, mild by echo 2013, 07/2012  . Severe tricuspid regurgitation     a. Severe by TEE 2012, mod by echo 2013, mod-sev by echo 07/2012  . Chronic vulvitis 07/1993  . Idiopathic anemia 2006  . Atrial fibrillation     a. Failed amiodarone (prolonged QT), not a candidate for class IC meds due to her CAD. No Multaq due to CHF hx;  b. 07/2012 s/p SJM Anthem RF Bi-V PPM, ser # [id number] and AV node RFCA;  c. chronic coumadin.  . Atrial flutter   . Pacemaker   . GI bleed     a. 10/2011 - Dr Benson Norway did scope which showed hiatal hernia, colon revealed diverticula (diverticular bleed), internal/ext hemorrhoids.    INTERIM HISTORY: has Atrial fibrillation; Hypothyroidism; Chronic anticoagulation; CAD (coronary artery disease); Diastolic dysfunction; Anemia associated with chronic renal failure; Atrial tachycardia; Right heart failure; MGUS (monoclonal gammopathy of unknown significance); GI bleed; CKD (chronic kidney disease) stage 3, GFR 30-59 ml/min; Abscess of right thigh s/p I&D 87/11/6431; Chronic systolic CHF (congestive heart failure); Atrial flutter; Long term (current) use of anticoagulants; Acute on chronic combined systolic and diastolic congestive heart failure, NYHA class 4; Diabetes mellitus; Chronic anemia; Cardiomyopathy-cause unknown ?  rate related v ischemic; Acute respiratory failure; Pulmonary edema; Pleural effusion; Atrial fibrillation with RVR; Physical deconditioning; Fever; Pneumonia; Weakness; Bacteremia due to Escherichia coli; Coronary artery disease; Hx of CABG; Chronic combined systolic and diastolic congestive heart failure; Diabetes mellitus type 2 with complications; Acute on chronic renal failure;  Pacemaker; DOE (dyspnea on exertion); Benign neoplasm of stomach; Adenomatous duodenal polyp; Diverticulosis of colon (without mention of hemorrhage); Symptomatic anemia; Encounter for therapeutic drug monitoring; and CHF (congestive heart failure) on her problem list.    ALLERGIES:  is allergic to betadine; capoten; codone; methadone; penicillins; and propoxyphene and methadone.  MEDICATIONS: has a current medication list which includes the following prescription(s): amiodarone, amitriptyline, atorvastatin, benzonatate, diltiazem, donepezil, esomeprazole, ezetimibe, furosemide, insulin glargine, insulin lispro, levothyroxine, metoprolol succinate, multiple vitamins-minerals, nitroglycerin, potassium chloride sa, and warfarin.  SURGICAL HISTORY:  Past Surgical History  Procedure Laterality Date  . Coronary artery bypass graft  06/2009    X3, LIMA to LAD, SVG to diagonal, SVG to the posterior descending artery.  . Tonsillectomy    . Knee arthroscopy  08/2001  . Transthoracic echocardiogram  11/12/2010     Ejection fraction felt to be around 50%.  Wall thickness was increased in a pattern of mild LVH.  Moderately dilated left atrium.  Mildly dilated right atrium. Right ventricle was mildly dilated with mildly reduced systolic function  . Cardiac catheterization  06/09/2009    inferior wall hypokinesia with ejection fraction of 55%.  . Abdominal hysterectomy    . Joint replacement    . Tee without cardioversion  05/23/2011    Procedure: TRANSESOPHAGEAL ECHOCARDIOGRAM (TEE);  Surgeon: Lelon Perla, MD;  Location: [facility];  Service: Cardiovascular;  Laterality: N/A;  . Incision and drainage abscess  05/07/2012    Procedure: INCISION AND DRAINAGE ABSCESS;  Surgeon: Adin Hector, MD;  Location: [facility];  Service: General;  Laterality: N/A;  Groin wound  . Total abdominal hysterectomy  1979  . Breast biopsy  Left  . Bilateral salpingectomy  10/2000  . Breast biopsy  09/18/2009    fibrocystic  change  . Coronary artery bypass graft  06/2009  . Pacemaker insertion  08/2012  . Colonoscopy N/A 01/19/2013    Procedure: COLONOSCOPY;  Surgeon: Inda Castle, MD;  Location: [facility];  Service: Endoscopy;  Laterality: N/A;  . Esophagogastroduodenoscopy N/A 01/19/2013    Procedure: ESOPHAGOGASTRODUODENOSCOPY (EGD);  Surgeon: Inda Castle, MD;  Location: [facility];  Service: Endoscopy;  Laterality: N/A;    REVIEW OF SYSTEMS:   Constitutional: Denies fevers, chills or abnormal weight loss Eyes: Denies blurriness of vision Ears, nose, mouth, throat, and face: Denies mucositis or sore throat Respiratory: Denies cough, dyspnea or wheezes Cardiovascular: Denies palpitation, chest discomfort or lower extremity swelling Gastrointestinal:  Denies nausea, heartburn or change in bowel habits Skin: Denies abnormal skin rashes Lymphatics: Denies new lymphadenopathy or easy bruising Neurological:Denies numbness, tingling or new weaknesses Behavioral/Psych: Mood is stable, no new changes  All other systems were reviewed with the patient and are negative.  PHYSICAL EXAMINATION: ECOG PERFORMANCE STATUS: 1 - Symptomatic but completely ambulatory  Blood pressure 129/60, pulse 70, temperature 98 F (36.7 C), temperature source Oral, resp. rate 21, height 5\' 3"  (1.6 m), weight 224 lb 11.2 oz (101.923 kg), SpO2 98.00%.   GENERAL:alert, no distress and comfortable; moderately obese, sitting in wheelchair. SKIN: skin color, texture, turgor are normal, no rashes or significant lesions EYES: normal, Conjunctiva are pink and non-injected, sclera clear OROPHARYNX:no exudate, no erythema and  lips, buccal mucosa, and tongue normal  NECK: supple, thyroid normal size, non-tender, without nodularity LYMPH:  no palpable lymphadenopathy in the cervical, axillary or supraclavicular LUNGS: clear to auscultation and percussion with normal breathing effort HEART:  RR with pacemaker in the left infraclavicular  area. Chronic lower extremity brawny edema with stasis changes.  ABDOMEN:abdomen soft, non-tender and normal bowel sounds Musculoskeletal:no cyanosis of digits and no clubbing  NEURO: alert & oriented x 3 with fluent speech, no focal motor/sensory deficits  Labs:  Lab Results  Component Value Date   WBC 6.8 01/23/2014   HGB 10.3* 01/23/2014   HCT 32.8* 01/23/2014   MCV 87.1 01/23/2014   PLT 301 01/23/2014   NEUTROABS 4.3 01/23/2014      Chemistry      Component Value Date/Time   NA 140 01/23/2014 1341   NA 135* 01/22/2014 0429   K 3.6 01/23/2014 1341   K 3.7 01/22/2014 0429   CL 93* 01/22/2014 0429   CL 105 08/30/2012 1108   CO2 33* 01/23/2014 1341   CO2 27 01/22/2014 0429   BUN 47.5* 01/23/2014 1341   BUN 47* 01/22/2014 0429   CREATININE 2.3* 01/23/2014 1341   CREATININE 2.28* 01/22/2014 0429   CREATININE 1.68* 05/16/2011 1352      Component Value Date/Time   CALCIUM 9.1 01/23/2014 1341   CALCIUM 8.6 01/22/2014 0429   ALKPHOS 127 01/23/2014 1341   ALKPHOS 145* 08/07/2012 0445   AST 30 01/23/2014 1341   AST 29 08/07/2012 0445   ALT 19 01/23/2014 1341   ALT 17 08/07/2012 0445   BILITOT 0.47 01/23/2014 1341   BILITOT 0.4 08/07/2012 0445     CBC:  Recent Labs Lab 01/19/14 2211 01/20/14 0230 01/21/14 0552 01/22/14 0429 01/23/14 1341  WBC 7.2 7.0 8.1 5.9 6.8  NEUTROABS  --   --   --   --  4.3  HGB 10.3* 9.5* 9.9* 9.9* 10.3*  HCT 33.0* 30.6* 31.9* 31.7* 32.8*  MCV 89.2 87.2 87.9 90.6 87.1  PLT 293 299 301 261 301   Studies:  No results found.   RADIOGRAPHIC STUDIES: No results found.  ASSESSMENT: [name] 80 y.o. female with a history of MGUS (monoclonal gammopathy of unknown significance)  Symptomatic anemia  Anemia associated with chronic renal failure, unspecified stage - Plan: CBC with Differential, CBC with Differential, Basic metabolic panel (Bmet) - [facility]   PLAN:  1. Anemia of chronic renal failure.  --She feels tired in general but denies chest discomfort.  Her  hemoglobin is 10.3 today up from 9.9 on discharge on 07/22.  She received her aranesp 200 mcg subcutaneous shot on 07/09.  We will continue giving her aranesp 200 mcg every two weeks  due to increasing blood transfusions requirements. She will continue to have CBCs every 2 weeks and have aranesp as indicated.  Her history is also notable for angiodyplasia and diverticulosis and hemorrhoids resulting in heme positive stool.    2. Afib/CHF/Pacer --Followed by cardiology.  Her medtronic biventricular pacemaker was working normally on 12/09 and recently checked 05/18.  She will continue amiodarone 100 mg daily.  3. CKDz. --Her creatinine is 2.3.  Creatinine clearance is 22.6 mL/min.   4. MGUS. --Her IgG levels remains stable. We will recheck on next visit. Her IgG level was 1990 on 09/27/2013.  5. Follow-up.   --Patient will return to clinic in 4 weeks for CBC, CMP and iron studies.  She will have labs in 2 weeks for consideration of  aranesp.  All questions were answered. The patient knows to call the clinic with any problems, questions or concerns. We can certainly see the patient much sooner if necessary.  I spent 15 minutes counseling the patient face to face. The total time spent in the appointment was 25 minutes.    Natasha Waitman, MD 01/23/2014 3:35 PM

## 2014-01-24 ENCOUNTER — Ambulatory Visit (INDEPENDENT_AMBULATORY_CARE_PROVIDER_SITE_OTHER): Payer: PRIVATE HEALTH INSURANCE | Admitting: Pharmacist Clinician (PhC)/ Clinical Pharmacy Specialist

## 2014-01-24 DIAGNOSIS — Z5181 Encounter for therapeutic drug level monitoring: Secondary | ICD-10-CM

## 2014-01-24 DIAGNOSIS — I4891 Unspecified atrial fibrillation: Secondary | ICD-10-CM

## 2014-01-24 DIAGNOSIS — Z7901 Long term (current) use of anticoagulants: Secondary | ICD-10-CM

## 2014-01-24 LAB — POCT INR: INR: 1.4

## 2014-01-29 ENCOUNTER — Other Ambulatory Visit: Payer: Self-pay | Admitting: *Deleted

## 2014-01-29 ENCOUNTER — Encounter: Payer: Self-pay | Admitting: Cardiology

## 2014-01-29 DIAGNOSIS — I509 Heart failure, unspecified: Secondary | ICD-10-CM

## 2014-01-29 LAB — BASIC METABOLIC PANEL WITH GFR
BUN: 32 mg/dL — ABNORMAL HIGH (ref 6–23)
CALCIUM: 8.6 mg/dL (ref 8.4–10.5)
CO2: 28 meq/L (ref 19–32)
CREATININE: 2.03 mg/dL — AB (ref 0.50–1.10)
Chloride: 95 mEq/L — ABNORMAL LOW (ref 96–112)
GFR, EST NON AFRICAN AMERICAN: 23 mL/min — AB
GFR, Est African American: 26 mL/min — ABNORMAL LOW
Glucose, Bld: 337 mg/dL — ABNORMAL HIGH (ref 70–99)
Potassium: 4.3 mEq/L (ref 3.5–5.3)
Sodium: 134 mEq/L — ABNORMAL LOW (ref 135–145)

## 2014-01-29 NOTE — Telephone Encounter (Signed)
This encounter was created in error - please disregard.

## 2014-01-31 ENCOUNTER — Telehealth: Payer: Self-pay

## 2014-01-31 NOTE — Telephone Encounter (Signed)
Spoke to husband bmet results given.Husband stated wife has been having occasional bleeding hemorrhoids.Advised to call PCP.

## 2014-02-04 ENCOUNTER — Other Ambulatory Visit: Payer: Self-pay

## 2014-02-05 ENCOUNTER — Encounter: Payer: Self-pay | Admitting: Cardiology

## 2014-02-05 ENCOUNTER — Ambulatory Visit (INDEPENDENT_AMBULATORY_CARE_PROVIDER_SITE_OTHER): Payer: Medicare Other | Admitting: Pharmacist Clinician (PhC)/ Clinical Pharmacy Specialist

## 2014-02-05 ENCOUNTER — Ambulatory Visit (INDEPENDENT_AMBULATORY_CARE_PROVIDER_SITE_OTHER): Payer: PRIVATE HEALTH INSURANCE | Admitting: Cardiology

## 2014-02-05 ENCOUNTER — Telehealth: Payer: Self-pay | Admitting: Cardiology

## 2014-02-05 VITALS — BP 138/70 | HR 90 | Ht 63.0 in | Wt 227.0 lb

## 2014-02-05 DIAGNOSIS — I4891 Unspecified atrial fibrillation: Secondary | ICD-10-CM

## 2014-02-05 DIAGNOSIS — Z7901 Long term (current) use of anticoagulants: Secondary | ICD-10-CM

## 2014-02-05 DIAGNOSIS — Z5181 Encounter for therapeutic drug level monitoring: Secondary | ICD-10-CM

## 2014-02-05 LAB — BASIC METABOLIC PANEL WITH GFR
BUN: 40 mg/dL — AB (ref 6–23)
CALCIUM: 8.5 mg/dL (ref 8.4–10.5)
CO2: 30 meq/L (ref 19–32)
CREATININE: 1.98 mg/dL — AB (ref 0.50–1.10)
Chloride: 102 mEq/L (ref 96–112)
GFR, Est African American: 27 mL/min — ABNORMAL LOW
GFR, Est Non African American: 24 mL/min — ABNORMAL LOW
Glucose, Bld: 53 mg/dL — ABNORMAL LOW (ref 70–99)
Potassium: 3.8 mEq/L (ref 3.5–5.3)
Sodium: 141 mEq/L (ref 135–145)

## 2014-02-05 LAB — POCT INR: INR: 3.7

## 2014-02-05 MED ORDER — STARCH 51 % RE SUPP: 1.0000 | RECTAL | Status: DC | PRN

## 2014-02-05 NOTE — Telephone Encounter (Signed)
[facility] called before faxing over stat labs to [phone].  Labs given to [employer]

## 2014-02-05 NOTE — Progress Notes (Signed)
Patient ID: [name], female   DOB: 10-14-1932, 80 y.o.   MRN: [id number] CC: Hospital follow up for acute on chronic CHF exacerbation  HPI: Patient is a 80 yo female presenting for follow-up after hospitalization. Patient hospitalized due to acute on chronic systolic and diastolic heart failure exacerbation. She was noted to weigh 233 lbs and her dry weight is 217 lb. She had been on a trial of increased PO lasix dosing as an outpatient, though had continued symptoms with this and presented to the ED. She was admitted for IV lasix therapy and received zaroxalyn with this as well. She had improvement in her shortness of breath and orthopnea. She was discharged on lasix 60 mg BID.  Patient reports that she was weak initially after leaving the hospital. This improved, though now notes she tires easily. Walking down hall causes breathing to become difficult. Can walk between kitchen and bedroom. No chest pain with this. Notes a stinging sensation at pacemaker site. Notes fluid is a little better. Is taking lasix 60 mg twice a day. She notes good urination with this lasix dose. Weight at home is up to 227 lbs now. Has been increasing from 218 on discharge. Watching fluid intake, drinks 3 glasses of fluid a day. Rarely uses salt now. Has been eating fresh things and canned foods and notes adding salt to these foods. Notes increased HR with activity and feeling palpitations infrequently with activity. No orthopnea or PND. Sleeps on 3 pillows, has not changed recently.  Past Medical History  Diagnosis Date  . Coronary artery disease     a. Severe two vessel; s/p CABG (LIMA-LAD, SVG-diag, SVG-PDA);  b. 05/2012 NSTEMI in setting of rapid aflutter;  c. 06/2012 Lexiscan cardiolite: small, mild reversible defect in lateral wall->mild ischemia, low-risk->med Rx.  Marland Kitchen Hypertension   . Type II diabetes mellitus   . Hyperlipidemia   . Diabetic neuropathy   . CVA (cerebral vascular accident) 2004  .  Hypothyroidism   . Chronic anemia   . GERD (gastroesophageal reflux disease)   . Arthritis   . Memory loss, short term   . COPD (chronic obstructive pulmonary disease)   . Blood transfusion without reported diagnosis   . Chronic combined systolic and diastolic CHF (congestive heart failure)     a. 07/2012 Echo: EF 30%, Gr II DD, Mild AS/MR, Mod-Sev TR, mild bi-atrial and RV dil, PASP 18mmHg.  . Moderate mitral regurgitation     a. mod by TEE 2012, mild by echo 2013, 07/2012  . Severe tricuspid regurgitation     a. Severe by TEE 2012, mod by echo 2013, mod-sev by echo 07/2012  . Chronic vulvitis 07/1993  . Idiopathic anemia 2006  . Atrial fibrillation     a. Failed amiodarone (prolonged QT), not a candidate for class IC meds due to her CAD. No Multaq due to CHF hx;  b. 07/2012 s/p SJM Anthem RF Bi-V PPM, ser # [id number] and AV node RFCA;  c. chronic coumadin.  . Atrial flutter   . Pacemaker   . GI bleed     a. 10/2011 - Dr Benson Norway did scope which showed hiatal hernia, colon revealed diverticula (diverticular bleed), internal/ext hemorrhoids.    Past Surgical History  Procedure Laterality Date  . Coronary artery bypass graft  06/2009    X3, LIMA to LAD, SVG to diagonal, SVG to the posterior descending artery.  . Tonsillectomy    . Knee arthroscopy  08/2001  . Transthoracic echocardiogram  11/12/2010  Ejection fraction felt to be around 50%.  Wall thickness was increased in a pattern of mild LVH.  Moderately dilated left atrium.  Mildly dilated right atrium. Right ventricle was mildly dilated with mildly reduced systolic function  . Cardiac catheterization  06/09/2009    inferior wall hypokinesia with ejection fraction of 55%.  . Abdominal hysterectomy    . Joint replacement    . Tee without cardioversion  05/23/2011    Procedure: TRANSESOPHAGEAL ECHOCARDIOGRAM (TEE);  Surgeon: Lelon Perla, MD;  Location: [facility];  Service: Cardiovascular;  Laterality: N/A;  . Incision and drainage  abscess  05/07/2012    Procedure: INCISION AND DRAINAGE ABSCESS;  Surgeon: Adin Hector, MD;  Location: [facility];  Service: General;  Laterality: N/A;  Groin wound  . Total abdominal hysterectomy  1979  . Breast biopsy  Left  . Bilateral salpingectomy  10/2000  . Breast biopsy  09/18/2009    fibrocystic change  . Coronary artery bypass graft  06/2009  . Pacemaker insertion  08/2012  . Colonoscopy N/A 01/19/2013    Procedure: COLONOSCOPY;  Surgeon: Inda Castle, MD;  Location: [facility];  Service: Endoscopy;  Laterality: N/A;  . Esophagogastroduodenoscopy N/A 01/19/2013    Procedure: ESOPHAGOGASTRODUODENOSCOPY (EGD);  Surgeon: Inda Castle, MD;  Location: [facility];  Service: Endoscopy;  Laterality: N/A;    Current Outpatient Prescriptions  Medication Sig Dispense Refill  . amiodarone (PACERONE) 200 MG tablet Take 100 mg by mouth daily.      Marland Kitchen amitriptyline (ELAVIL) 25 MG tablet Take 75 mg by mouth at bedtime.        Marland Kitchen atorvastatin (LIPITOR) 40 MG tablet Take 40 mg by mouth every evening.       . benzonatate (TESSALON) 200 MG capsule Take 1 capsule (200 mg total) by mouth 3 (three) times daily.  20 capsule  0  . diltiazem (CARDIZEM CD) 240 MG 24 hr capsule Take 240 mg by mouth daily.      Marland Kitchen donepezil (ARICEPT) 10 MG tablet Take 10 mg by mouth at bedtime.      Marland Kitchen esomeprazole (NEXIUM) 40 MG capsule Take 40 mg by mouth daily before breakfast.        . ezetimibe (ZETIA) 10 MG tablet Take 10 mg by mouth every evening.       . furosemide (LASIX) 20 MG tablet Take 3 tablets (60 mg total) by mouth 2 (two) times daily.  180 tablet  11  . insulin glargine (LANTUS) 100 UNIT/ML injection Inject 50 Units into the skin 2 (two) times daily.       . insulin lispro (HUMALOG) 100 UNIT/ML injection Inject 20-25 Units into the skin 2 (two) times daily as needed for high blood sugar. Home sliding scale If blood sugar is <200 take 20 units If blood sugar is >200 take 25 units If blood sugar is less than  70, don't take any      . levothyroxine (SYNTHROID, LEVOTHROID) 100 MCG tablet Take 100 mcg by mouth every morning.       . metoprolol succinate (TOPROL-XL) 50 MG 24 hr tablet Take 50 mg by mouth daily. Take with or immediately following a meal.      . Multiple Vitamins-Minerals (ICAPS AREDS FORMULA PO) Take 1 tablet by mouth 2 (two) times daily.      . nitroGLYCERIN (NITROSTAT) 0.4 MG SL tablet Place 0.4 mg under the tongue every 5 (five) minutes as needed. For chest pain      .  potassium chloride SA (K-DUR,KLOR-CON) 20 MEQ tablet Take 2 tablets (40 mEq total) by mouth 2 (two) times daily.  60 tablet  11  . warfarin (COUMADIN) 5 MG tablet Take 7.5-10 mg by mouth daily. Take 10mg  by mouth on Sunday, Monday, Wednesday, Friday and Saturday. Take 7.5mg  by mouth on Tuesday and Thursday.      . [DISCONTINUED] enoxaparin (LOVENOX) 100 MG/ML SOLN Inject into the skin every 12 (twelve) hours.        . [DISCONTINUED] simvastatin (ZOCOR) 80 MG tablet Take 80 mg by mouth at bedtime.         No current facility-administered medications for this visit.    Allergies  Allergen Reactions  . Betadine [Povidone Iodine] Itching  . Capoten [Captopril] Other (See Comments)    unknown  . Codone [Hydrocodone] Nausea And Vomiting  . Methadone Nausea And Vomiting  . Penicillins Swelling  . Propoxyphene And Methadone     Intolerance to darvocet    ROS: per HPI  BP 138/70  Pulse 90  Ht 5\' 3"  (1.6 m)  Wt 227 lb (102.967 kg)  BMI 40.22 kg/m2  PHYSICAL EXAM Gen: NAD, appears comfortable HEENT: PERRL, MMM Neck: JVD to the jaw line, no carotid bruits CV: RRR, 2/6 systolic murmur at left upper sternal border Pulm: CTAB, no wheezes or crackles Skin: bilateral LE with thickening of skin Ext: 3+ pitting edema to the upper shin  Labs: No results found for this or any previous visit (from the past 24 hour(s)).   Assessment and Plan:  1. Acute on chronic systolic and diastolic heart failure: patient volume  overloaded today. She is up 10 lbs since discharge from the hospital. At this time we will check a stat BMET to determine renal function. She will continue her lasix 60 mg BID until her BMET results at which time we will determine her medication regimen moving forward. If renal function is stable we will plan on increasing her lasix likely to 80 mg BID and will likely add metolazone to her regimen for one dose to help with diuresis. She is to do daily weights. She is to focus on a low salt/no salt diet. She is to continue to limit fluid intake. If symptoms worsen she should go to the ED for evaluation.   2. Chronic coumadin therapy: remains on coumadin. Is to have INR checked today.   3. Atrial fib: s/p AV node ablation. Has continued on amiodarone. To continue follow-up with Dr Lovena Le.  Tommi Rumps, MD  [facility] PGY-3   I have seen and examined the patient along with Dr. Caryl Bis. I agree with the assessment and plan as outlined in detail above. Mrs. Masoud presents to clinic today for post-hospital f/u after a recent hospitalization for a/c CHF. She presents to clinic today with a 10 lb weight gain, despite full medication compliance. After interview with her and her husband, I suspect her increase in weight is secondary to dietary indiscretion. We discussed the importance of adhering to a low sodium diet and discussed food options/ substitutes. We will order a BMP to reassess renal function and electrolytes. Once results return, we will notify the patient by phone regarding how much to increase her diuretic dose and for how long. She has been instructed to f/u in 1-2 weeks for repeat assessment.    Lyda Jester, PA-C

## 2014-02-05 NOTE — Patient Instructions (Signed)
Your physician recommends that you schedule a follow-up appointment in: 1-2 WEEKS WITH EXTENDER WHEN DR Martinique IS IN THE OFFICE  Your physician recommends that you HAVE LAB WORK TODAY  WEIGH DAILY  Low-Sodium Eating Plan Sodium raises blood pressure and causes water to be held in the body. Getting less sodium from food will help lower your blood pressure, reduce any swelling, and protect your heart, liver, and kidneys. We get sodium by adding salt (sodium chloride) to food. Most of our sodium comes from canned, boxed, and frozen foods. Restaurant foods, fast foods, and pizza are also very high in sodium. Even if you take medicine to lower your blood pressure or to reduce fluid in your body, getting less sodium from your food is important. WHAT IS MY PLAN? Most people should limit their sodium intake to 2,300 mg a day. Your health care provider recommends that you limit your sodium intake to __________ a day.  WHAT DO I NEED TO KNOW ABOUT THIS EATING PLAN? For the low-sodium eating plan, you will follow these general guidelines:  Choose foods with a % Daily Value for sodium of less than 5% (as listed on the food label).   Use salt-free seasonings or herbs instead of table salt or sea salt.   Check with your health care provider or pharmacist before using salt substitutes.   Eat fresh foods.  Eat more vegetables and fruits.  Limit canned vegetables. If you do use them, rinse them well to decrease the sodium.   Limit cheese to 1 oz (28 g) per day.   Eat lower-sodium products, often labeled as "lower sodium" or "no salt added."  Avoid foods that contain monosodium glutamate (MSG). MSG is sometimes added to [place] food and some canned foods.  Check food labels (Nutrition Facts labels) on foods to learn how much sodium is in one serving.  Eat more home-cooked food and less restaurant, buffet, and fast food.  When eating at a restaurant, ask that your food be prepared with less  salt or none, if possible.  HOW DO I READ FOOD LABELS FOR SODIUM INFORMATION? The Nutrition Facts label lists the amount of sodium in one serving of the food. If you eat more than one serving, you must multiply the listed amount of sodium by the number of servings. Food labels may also identify foods as:  Sodium free--Less than 5 mg in a serving.  Very low sodium--35 mg or less in a serving.  Low sodium--140 mg or less in a serving.  Light in sodium--50% less sodium in a serving. For example, if a food that usually has 300 mg of sodium is changed to become light in sodium, it will have 150 mg of sodium.  Reduced sodium--25% less sodium in a serving. For example, if a food that usually has 400 mg of sodium is changed to reduced sodium, it will have 300 mg of sodium. WHAT FOODS CAN I EAT? Grains Low-sodium cereals, including oats, puffed wheat and rice, and shredded wheat cereals. Low-sodium crackers. Unsalted rice and pasta. Lower-sodium bread.  Vegetables Frozen or fresh vegetables. Low-sodium or reduced-sodium canned vegetables. Low-sodium or reduced-sodium tomato sauce and paste. Low-sodium or reduced-sodium tomato and vegetable juices.  Fruits Fresh, frozen, and canned fruit. Fruit juice.  Meat and Other Protein Products Low-sodium canned tuna and salmon. Fresh or frozen meat, poultry, seafood, and fish. Lamb. Unsalted nuts. Dried beans, peas, and lentils without added salt. Unsalted canned beans. Homemade soups without salt. Eggs.  Dairy Milk.  Soy milk. Ricotta cheese. Low-sodium or reduced-sodium cheeses. Yogurt.  Condiments Fresh and dried herbs and spices. Salt-free seasonings. Onion and garlic powders. Low-sodium varieties of mustard and ketchup. Lemon juice.  Fats and Oils Reduced-sodium salad dressings. Unsalted butter.  Other Unsalted popcorn and pretzels.  The items listed above may not be a complete list of recommended foods or beverages. Contact your dietitian  for more options. WHAT FOODS ARE NOT RECOMMENDED? Grains Instant hot cereals. Bread stuffing, pancake, and biscuit mixes. Croutons. Seasoned rice or pasta mixes. Noodle soup cups. Boxed or frozen macaroni and cheese. Self-rising flour. Regular salted crackers. Vegetables Regular canned vegetables. Regular canned tomato sauce and paste. Regular tomato and vegetable juices. Frozen vegetables in sauces. Salted french fries. Olives. [name]. Relishes. Sauerkraut. Salsa. Meat and Other Protein Products Salted, canned, smoked, spiced, or pickled meats, seafood, or fish. Bacon, ham, sausage, hot dogs, corned beef, chipped beef, and packaged luncheon meats. Salt pork. Jerky. Pickled herring. Anchovies, regular canned tuna, and sardines. Salted nuts. Dairy Processed cheese and cheese spreads. Cheese curds. Blue cheese and cottage cheese. Buttermilk.  Condiments Onion and garlic salt, seasoned salt, table salt, and sea salt. Canned and packaged gravies. Worcestershire sauce. Tartar sauce. Barbecue sauce. Teriyaki sauce. Soy sauce, including reduced sodium. Steak sauce. Fish sauce. Oyster sauce. Cocktail sauce. Horseradish. Regular ketchup and mustard. Meat flavorings and tenderizers. Bouillon cubes. Hot sauce. Tabasco sauce. Marinades. Taco seasonings. Relishes. Fats and Oils Regular salad dressings. Salted butter. Margarine. Ghee. Bacon fat.  Other Potato and tortilla chips. Corn chips and puffs. Salted popcorn and pretzels. Canned or dried soups. Pizza. Frozen entrees and pot pies.  The items listed above may not be a complete list of foods and beverages to avoid. Contact your dietitian for more information. Document Released: 12/10/2001 Document Revised: 06/25/2013 Document Reviewed: 04/24/2013 [address] Patient Information 2015 [place], [place]. This information is not intended to replace advice given to you by your health care provider. Make sure you discuss any questions you have with your  health care provider.

## 2014-02-06 ENCOUNTER — Other Ambulatory Visit (HOSPITAL_BASED_OUTPATIENT_CLINIC_OR_DEPARTMENT_OTHER): Payer: Medicare Other

## 2014-02-06 ENCOUNTER — Ambulatory Visit (HOSPITAL_BASED_OUTPATIENT_CLINIC_OR_DEPARTMENT_OTHER): Payer: Medicare Other

## 2014-02-06 VITALS — BP 131/65 | HR 89 | Temp 98.3°F

## 2014-02-06 DIAGNOSIS — N039 Chronic nephritic syndrome with unspecified morphologic changes: Secondary | ICD-10-CM

## 2014-02-06 DIAGNOSIS — D631 Anemia in chronic kidney disease: Secondary | ICD-10-CM

## 2014-02-06 DIAGNOSIS — N183 Chronic kidney disease, stage 3 (moderate): Principal | ICD-10-CM

## 2014-02-06 DIAGNOSIS — N189 Chronic kidney disease, unspecified: Secondary | ICD-10-CM

## 2014-02-06 DIAGNOSIS — D649 Anemia, unspecified: Secondary | ICD-10-CM

## 2014-02-06 DIAGNOSIS — D472 Monoclonal gammopathy: Secondary | ICD-10-CM

## 2014-02-06 LAB — CBC WITH DIFFERENTIAL/PLATELET
BASO%: 1.3 % (ref 0.0–2.0)
Basophils Absolute: 0.1 10*3/uL (ref 0.0–0.1)
EOS%: 2.6 % (ref 0.0–7.0)
Eosinophils Absolute: 0.1 10*3/uL (ref 0.0–0.5)
HCT: 33.2 % — ABNORMAL LOW (ref 34.8–46.6)
HGB: 10.6 g/dL — ABNORMAL LOW (ref 11.6–15.9)
LYMPH%: 18.4 % (ref 14.0–49.7)
MCH: 27.7 pg (ref 25.1–34.0)
MCHC: 31.8 g/dL (ref 31.5–36.0)
MCV: 87 fL (ref 79.5–101.0)
MONO#: 0.9 10*3/uL (ref 0.1–0.9)
MONO%: 18.4 % — AB (ref 0.0–14.0)
NEUT#: 3 10*3/uL (ref 1.5–6.5)
NEUT%: 59.3 % (ref 38.4–76.8)
PLATELETS: 223 10*3/uL (ref 145–400)
RBC: 3.82 10*6/uL (ref 3.70–5.45)
RDW: 18.8 % — ABNORMAL HIGH (ref 11.2–14.5)
WBC: 5.1 10*3/uL (ref 3.9–10.3)
lymph#: 0.9 10*3/uL (ref 0.9–3.3)

## 2014-02-06 LAB — IRON AND TIBC CHCC
%SAT: 16 % — ABNORMAL LOW (ref 21–57)
Iron: 44 ug/dL (ref 41–142)
TIBC: 280 ug/dL (ref 236–444)
UIBC: 236 ug/dL (ref 120–384)

## 2014-02-06 LAB — FERRITIN CHCC: Ferritin: 128 ng/ml (ref 9–269)

## 2014-02-06 MED ORDER — DARBEPOETIN ALFA-POLYSORBATE 200 MCG/0.4ML IJ SOLN
200.0000 ug | Freq: Once | INTRAMUSCULAR | Status: AC
  Administered 2014-02-06: 200 ug via SUBCUTANEOUS
  Filled 2014-02-06: qty 0.4

## 2014-02-07 LAB — IGG, IGA, IGM
IGM, SERUM: 138 mg/dL (ref 52–322)
IgA: 312 mg/dL (ref 69–380)
IgG (Immunoglobin G), Serum: 1920 mg/dL — ABNORMAL HIGH (ref 690–1700)

## 2014-02-07 LAB — KAPPA/LAMBDA LIGHT CHAINS
KAPPA FREE LGHT CHN: 7.19 mg/dL — AB (ref 0.33–1.94)
KAPPA LAMBDA RATIO: 1.57 (ref 0.26–1.65)
Lambda Free Lght Chn: 4.57 mg/dL — ABNORMAL HIGH (ref 0.57–2.63)

## 2014-02-14 ENCOUNTER — Encounter: Payer: Self-pay | Admitting: Internal Medicine

## 2014-02-20 ENCOUNTER — Ambulatory Visit (HOSPITAL_BASED_OUTPATIENT_CLINIC_OR_DEPARTMENT_OTHER): Payer: Medicare Other | Admitting: Internal Medicine

## 2014-02-20 ENCOUNTER — Other Ambulatory Visit (HOSPITAL_BASED_OUTPATIENT_CLINIC_OR_DEPARTMENT_OTHER): Payer: Medicare Other

## 2014-02-20 ENCOUNTER — Other Ambulatory Visit: Payer: Medicare Other

## 2014-02-20 ENCOUNTER — Encounter: Payer: Self-pay | Admitting: Internal Medicine

## 2014-02-20 ENCOUNTER — Telehealth: Payer: Self-pay | Admitting: Internal Medicine

## 2014-02-20 ENCOUNTER — Ambulatory Visit: Payer: Medicare Other

## 2014-02-20 ENCOUNTER — Ambulatory Visit (HOSPITAL_BASED_OUTPATIENT_CLINIC_OR_DEPARTMENT_OTHER): Payer: Medicare Other

## 2014-02-20 VITALS — BP 153/56 | HR 70 | Temp 98.2°F | Resp 19 | Ht 63.0 in | Wt 226.2 lb

## 2014-02-20 DIAGNOSIS — I4891 Unspecified atrial fibrillation: Secondary | ICD-10-CM

## 2014-02-20 DIAGNOSIS — D631 Anemia in chronic kidney disease: Secondary | ICD-10-CM

## 2014-02-20 DIAGNOSIS — N179 Acute kidney failure, unspecified: Secondary | ICD-10-CM

## 2014-02-20 DIAGNOSIS — I509 Heart failure, unspecified: Secondary | ICD-10-CM

## 2014-02-20 DIAGNOSIS — N039 Chronic nephritic syndrome with unspecified morphologic changes: Secondary | ICD-10-CM

## 2014-02-20 DIAGNOSIS — N189 Chronic kidney disease, unspecified: Secondary | ICD-10-CM

## 2014-02-20 DIAGNOSIS — D472 Monoclonal gammopathy: Secondary | ICD-10-CM

## 2014-02-20 DIAGNOSIS — D500 Iron deficiency anemia secondary to blood loss (chronic): Secondary | ICD-10-CM

## 2014-02-20 DIAGNOSIS — K649 Unspecified hemorrhoids: Secondary | ICD-10-CM

## 2014-02-20 DIAGNOSIS — D649 Anemia, unspecified: Secondary | ICD-10-CM

## 2014-02-20 DIAGNOSIS — N183 Chronic kidney disease, stage 3 (moderate): Principal | ICD-10-CM

## 2014-02-20 LAB — CBC WITH DIFFERENTIAL/PLATELET
BASO%: 0.7 % (ref 0.0–2.0)
Basophils Absolute: 0.1 10*3/uL (ref 0.0–0.1)
EOS%: 1.6 % (ref 0.0–7.0)
Eosinophils Absolute: 0.1 10*3/uL (ref 0.0–0.5)
HCT: 30.4 % — ABNORMAL LOW (ref 34.8–46.6)
HGB: 9.6 g/dL — ABNORMAL LOW (ref 11.6–15.9)
LYMPH#: 0.9 10*3/uL (ref 0.9–3.3)
LYMPH%: 11.9 % — ABNORMAL LOW (ref 14.0–49.7)
MCH: 27.1 pg (ref 25.1–34.0)
MCHC: 31.6 g/dL (ref 31.5–36.0)
MCV: 85.8 fL (ref 79.5–101.0)
MONO#: 1.1 10*3/uL — ABNORMAL HIGH (ref 0.1–0.9)
MONO%: 14.1 % — AB (ref 0.0–14.0)
NEUT#: 5.5 10*3/uL (ref 1.5–6.5)
NEUT%: 71.7 % (ref 38.4–76.8)
Platelets: 169 10*3/uL (ref 145–400)
RBC: 3.54 10*6/uL — ABNORMAL LOW (ref 3.70–5.45)
RDW: 18.9 % — ABNORMAL HIGH (ref 11.2–14.5)
WBC: 7.6 10*3/uL (ref 3.9–10.3)

## 2014-02-20 LAB — BASIC METABOLIC PANEL (CC13)
Anion Gap: 12 mEq/L — ABNORMAL HIGH (ref 3–11)
BUN: 40.4 mg/dL — AB (ref 7.0–26.0)
CO2: 25 mEq/L (ref 22–29)
Calcium: 9 mg/dL (ref 8.4–10.4)
Chloride: 103 mEq/L (ref 98–109)
Creatinine: 2.2 mg/dL — ABNORMAL HIGH (ref 0.6–1.1)
Glucose: 112 mg/dl (ref 70–140)
POTASSIUM: 4.3 meq/L (ref 3.5–5.1)
Sodium: 141 mEq/L (ref 136–145)

## 2014-02-20 MED ORDER — DARBEPOETIN ALFA-POLYSORBATE 200 MCG/0.4ML IJ SOLN
200.0000 ug | Freq: Once | INTRAMUSCULAR | Status: AC
  Administered 2014-02-20: 200 ug via SUBCUTANEOUS
  Filled 2014-02-20: qty 0.4

## 2014-02-20 NOTE — Patient Instructions (Signed)
Hemorrhoids Hemorrhoids are swollen veins around the rectum or anus. There are two types of hemorrhoids:   Internal hemorrhoids. These occur in the veins just inside the rectum. They may poke through to the outside and become irritated and painful.  External hemorrhoids. These occur in the veins outside the anus and can be felt as a painful swelling or hard lump near the anus. CAUSES  Pregnancy.   Obesity.   Constipation or diarrhea.   Straining to have a bowel movement.   Sitting for long periods on the toilet.  Heavy lifting or other activity that caused you to strain   SYMPTOMS   Pain.   Anal itching or irritation.   Rectal bleeding.   Fecal leakage.   Anal swelling.   One or more lumps around the anus.  DIAGNOSIS  Your caregiver may be able to diagnose hemorrhoids by visual examination. Other examinations or tests that may be performed include:   Examination of the rectal area with a gloved hand (digital rectal exam).   Examination of anal canal using a small tube (scope).   A blood test if you have lost a significant amount of blood.  A test to look inside the colon (sigmoidoscopy or colonoscopy). TREATMENT Most hemorrhoids can be treated at home. However, if symptoms do not seem to be getting better or if you have a lot of rectal bleeding, your caregiver may perform a procedure to help make the hemorrhoids get smaller or remove them completely. Possible treatments include:   Placing a rubber band at the base of the hemorrhoid to cut off the circulation (rubber band ligation).   Injecting a chemical to shrink the hemorrhoid (sclerotherapy).   Using a tool to burn the hemorrhoid (infrared light therapy).   Surgically removing the hemorrhoid (hemorrhoidectomy).   Stapling the hemorrhoid to block blood flow to the tissue (hemorrhoid stapling).  HOME CARE INSTRUCTIONS   Eat foods with fiber, such as whole grains, beans, nuts, fruits, and  vegetables. Ask your doctor about taking products with added fiber in them (fibersupplements).  Increase fluid intake. Drink enough water and fluids to keep your urine clear or pale yellow.   Exercise regularly.   Go to the bathroom when you have the urge to have a bowel movement. Do not wait.   Avoid straining to have bowel movements.   Keep the anal area dry and clean. Use wet toilet paper or moist towelettes after a bowel movement.   Medicated creams and suppositories may be used or applied as directed.   Only take over-the-counter or prescription medicines as directed by your caregiver.   Take warm sitz baths for 15-20 minutes, 3-4 times a day to ease pain and discomfort.   Place ice packs on the hemorrhoids if they are tender and swollen. Using ice packs between sitz baths may be helpful.   Put ice in a plastic bag.   Place a towel between your skin and the bag.   Leave the ice on for 15-20 minutes, 3-4 times a day.   Do not use a donut-shaped pillow or sit on the toilet for long periods. This increases blood pooling and pain.  SEEK MEDICAL CARE IF:  You have increasing pain and swelling that is not controlled by treatment or medicine.  You have uncontrolled bleeding.  You have difficulty or you are unable to have a bowel movement.  You have pain or inflammation outside the area of the hemorrhoids. MAKE SURE YOU:  Understand these instructions.  Will watch your condition.  Will get help right away if you are not doing well or get worse. Document Released: 06/17/2000 Document Revised: 06/06/2012 Document Reviewed: 04/24/2012 [facility] Patient Information 2015 [place], [place]. This information is not intended to replace advice given to you by your health care provider. Make sure you discuss any questions you have with your health care provider. Psyllium Chewable bar What is this medicine? PSYLLIUM (SIL i yum) is a bulk-forming fiber laxative. This  medicine is used to treat constipation. This medicine may be used for other purposes; ask your health care provider or pharmacist if you have questions. COMMON BRAND NAME(S): Metamucil What should I tell my health care provider before I take this medicine? They need to know if you have any of these conditions: -change in bowel habits for more than 14 days -blocked intestines or bowel -stomach pain, nausea, or vomiting -trouble swallowing -an unusual or allergic reaction to psyllium, other medicines, dyes, or preservatives -pregnant or trying or get pregnant -breast-feeding How should I use this medicine? Take this medicine by mouth with a full glass of water. Follow the directions on the package labeling, or take as directed by your health care professional. Take your medicine at regular intervals. Do not take your medicine more often than directed. Talk to your pediatrician regarding the use of this medicine in children. While this drug may be prescribed for children as young as 59 years old for selected conditions, precautions do apply. Overdosage: If you think you have taken too much of this medicine contact a poison control center or emergency room at once. NOTE: This medicine is only for you. Do not share this medicine with others. What if I miss a dose? If you miss a dose, take it as soon as you can. If it is almost time for your next dose, take only that dose. Do not take double or extra doses. What may interact with this medicine? Interactions are not expected. This list may not describe all possible interactions. Give your health care provider a list of all the medicines, herbs, non-prescription drugs, or dietary supplements you use. Also tell them if you smoke, drink alcohol, or use illegal drugs. Some items may interact with your medicine. What should I watch for while using this medicine? This medicine can take up to 3 days to work. Check with your doctor or health care professional  if your symptoms do not start to get better or if they get worse. See your doctor if you have to treat your constipation for more than 1 week. Avoid taking other medicines within 2 hours of taking this medicine. Drink several glasses of water a day while you are taking this medicine. This will help to relieve constipation and prevent dehydration. What side effects may I notice from receiving this medicine? Side effects that you should report to your doctor or health care professional as soon as possible: -allergic reactions like skin rash, itching or hives, swelling of the face, lips, or tongue -breathing problems -chest pain -nausea, vomiting -rectal bleeding -trouble swallowing Side effects that usually do not require medical attention (report to your doctor or health care professional if they continue or are bothersome): -bloated or 'gassy' feeling -diarrhea -headache -stomach cramps This list may not describe all possible side effects. Call your doctor for medical advice about side effects. You may report side effects to FDA at 1-800-FDA-1088. Where should I keep my medicine? Keep out of the reach of children. Store at room temperature  between 15 and 30 degrees C (59 and 86 degrees F). Protect from moisture. Throw away any unused medicine after the expiration date. NOTE: This sheet is a summary. It may not cover all possible information. If you have questions about this medicine, talk to your doctor, pharmacist, or health care provider.  2015, [employer]vier/[employer] Standard. (2008-01-25 13:14:33) Constipation Constipation is when a person has fewer than three bowel movements a week, has difficulty having a bowel movement, or has stools that are dry, hard, or larger than normal. As people grow older, constipation is more common. If you try to fix constipation with medicines that make you have a bowel movement (laxatives), the problem may get worse. Long-term laxative use may cause the muscles of  the colon to become weak. A low-fiber diet, not taking in enough fluids, and taking certain medicines may make constipation worse.  CAUSES   Certain medicines, such as antidepressants, pain medicine, iron supplements, antacids, and water pills.   Certain diseases, such as diabetes, irritable bowel syndrome (IBS), thyroid disease, or depression.   Not drinking enough water.   Not eating enough fiber-rich foods.   Stress or travel.   Lack of physical activity or exercise.   Ignoring the urge to have a bowel movement.   Using laxatives too much.  SIGNS AND SYMPTOMS   Having fewer than three bowel movements a week.   Straining to have a bowel movement.   Having stools that are hard, dry, or larger than normal.   Feeling full or bloated.   Pain in the lower abdomen.   Not feeling relief after having a bowel movement.  DIAGNOSIS  Your health care provider will take a medical history and perform a physical exam. Further testing may be done for severe constipation. Some tests may include:  A barium enema X-ray to examine your rectum, colon, and, sometimes, your small intestine.   A sigmoidoscopy to examine your lower colon.   A colonoscopy to examine your entire colon. TREATMENT  Treatment will depend on the severity of your constipation and what is causing it. Some dietary treatments include drinking more fluids and eating more fiber-rich foods. Lifestyle treatments may include regular exercise. If these diet and lifestyle recommendations do not help, your health care provider may recommend taking over-the-counter laxative medicines to help you have bowel movements. Prescription medicines may be prescribed if over-the-counter medicines do not work.  HOME CARE INSTRUCTIONS   Eat foods that have a lot of fiber, such as fruits, vegetables, whole grains, and beans.  Limit foods high in fat and processed sugars, such as french fries, hamburgers, cookies, candies, and  soda.   A fiber supplement may be added to your diet if you cannot get enough fiber from foods.   Drink enough fluids to keep your urine clear or pale yellow.   Exercise regularly or as directed by your health care provider.   Go to the restroom when you have the urge to go. Do not hold it.   Only take over-the-counter or prescription medicines as directed by your health care provider. Do not take other medicines for constipation without talking to your health care provider first.  [place] IF:   You have bright red blood in your stool.   Your constipation lasts for more than 4 days or gets worse.   You have abdominal or rectal pain.   You have thin, pencil-like stools.   You have unexplained weight loss. MAKE SURE YOU:   Understand  these instructions.  Will watch your condition.  Will get help right away if you are not doing well or get worse. Document Released: 03/18/2004 Document Revised: 06/25/2013 Document Reviewed: 04/01/2013 [facility] Patient Information 2015 [place], [place]. This information is not intended to replace advice given to you by your health care provider. Make sure you discuss any questions you have with your health care provider.

## 2014-02-20 NOTE — Telephone Encounter (Signed)
Pt confirmed labs/ov per 08/20 POF, gave pt AVS...KJ °

## 2014-02-20 NOTE — Progress Notes (Signed)
[facility] OFFICE PROGRESS NOTE  [name], MD [place] [address]  DIAGNOSIS: Acute on chronic renal failure  Chief Complaint  Patient presents with  . Anemia associated with chronic renal failure    CURRENT THERAPY: Aranesp 200 mcg subcutaneous every two weeks for a hemoglobin less than 11.    INTERVAL HISTORY: [name] 80 y.o. female with a history of IgG lambda MGUS, Anemia associated with chronic renal failure who presents for follow up. Today, she is accompanied by her husband. She was last seen by me on 01/23/2014.   As previously noted she reports being admitted to [facility] for CHF from 07/15 and being discharged on 07/22.  She weighed 233  On admission and 217 lbs on discharge.  Her lasix was increased.  During this admission, she also received 3 units of packed RBCs (Hgb of 7.8 on admission, 9.9 on discharge).  She denies rectal bleeding but does report intermittent blood on the tissue paper.  She is compliant to anticoagulation.  She continues aranesp 200 mcg subcutaneous every two weeks for a  hemogloblin less than 11. She denies fevers of chills or acute shortness of breath. She also denies chest pain.  MEDICAL HISTORY: Past Medical History  Diagnosis Date  . Coronary artery disease     a. Severe two vessel; s/p CABG (LIMA-LAD, SVG-diag, SVG-PDA);  b. 05/2012 NSTEMI in setting of rapid aflutter;  c. 06/2012 Lexiscan cardiolite: small, mild reversible defect in lateral wall->mild ischemia, low-risk->med Rx.  Marland Kitchen Hypertension   . Type II diabetes mellitus   . Hyperlipidemia   . Diabetic neuropathy   . CVA (cerebral vascular accident) 2004  . Hypothyroidism   . Chronic anemia   . GERD (gastroesophageal reflux disease)   . Arthritis   . Memory loss, short term   . COPD (chronic obstructive pulmonary disease)   . Blood transfusion without reported diagnosis   . Chronic combined systolic and diastolic CHF (congestive heart  failure)     a. 07/2012 Echo: EF 30%, Gr II DD, Mild AS/MR, Mod-Sev TR, mild bi-atrial and RV dil, PASP 82mmHg.  . Moderate mitral regurgitation     a. mod by TEE 2012, mild by echo 2013, 07/2012  . Severe tricuspid regurgitation     a. Severe by TEE 2012, mod by echo 2013, mod-sev by echo 07/2012  . Chronic vulvitis 07/1993  . Idiopathic anemia 2006  . Atrial fibrillation     a. Failed amiodarone (prolonged QT), not a candidate for class IC meds due to her CAD. No Multaq due to CHF hx;  b. 07/2012 s/p SJM Anthem RF Bi-V PPM, ser # [id number] and AV node RFCA;  c. chronic coumadin.  . Atrial flutter   . Pacemaker   . GI bleed     a. 10/2011 - Dr Benson Norway did scope which showed hiatal hernia, colon revealed diverticula (diverticular bleed), internal/ext hemorrhoids.    INTERIM HISTORY: has Atrial fibrillation; Hypothyroidism; Chronic anticoagulation; CAD (coronary artery disease); Diastolic dysfunction; Anemia associated with chronic renal failure; Atrial tachycardia; Right heart failure; MGUS (monoclonal gammopathy of unknown significance); GI bleed; CKD (chronic kidney disease) stage 3, GFR 30-59 ml/min; Abscess of right thigh s/p I&D 08/10/2534; Chronic systolic CHF (congestive heart failure); Atrial flutter; Long term (current) use of anticoagulants; Acute on chronic combined systolic and diastolic congestive heart failure, NYHA class 4; Diabetes mellitus; Chronic anemia; Cardiomyopathy-cause unknown ? rate related v ischemic; Acute respiratory failure; Pulmonary edema; Pleural effusion;  Atrial fibrillation with RVR; Physical deconditioning; Fever; Pneumonia; Weakness; Bacteremia due to Escherichia coli; Coronary artery disease; Hx of CABG; Chronic combined systolic and diastolic congestive heart failure; Diabetes mellitus type 2 with complications; Acute on chronic renal failure; Pacemaker; DOE (dyspnea on exertion); Benign neoplasm of stomach; Adenomatous duodenal polyp; Diverticulosis of colon (without  mention of hemorrhage); Symptomatic anemia; Encounter for therapeutic drug monitoring; and CHF (congestive heart failure) on her problem list.    ALLERGIES:  is allergic to betadine; capoten; codone; methadone; penicillins; and propoxyphene and methadone.  MEDICATIONS: has a current medication list which includes the following prescription(s): amiodarone, amitriptyline, atorvastatin, diltiazem, donepezil, furosemide, insulin glargine, insulin lispro, levothyroxine, metoprolol succinate, multiple vitamins-minerals, nitroglycerin, potassium chloride sa, starch, warfarin, benzonatate, esomeprazole, and ezetimibe.  SURGICAL HISTORY:  Past Surgical History  Procedure Laterality Date  . Coronary artery bypass graft  06/2009    X3, LIMA to LAD, SVG to diagonal, SVG to the posterior descending artery.  . Tonsillectomy    . Knee arthroscopy  08/2001  . Transthoracic echocardiogram  11/12/2010     Ejection fraction felt to be around 50%.  Wall thickness was increased in a pattern of mild LVH.  Moderately dilated left atrium.  Mildly dilated right atrium. Right ventricle was mildly dilated with mildly reduced systolic function  . Cardiac catheterization  06/09/2009    inferior wall hypokinesia with ejection fraction of 55%.  . Abdominal hysterectomy    . Joint replacement    . Tee without cardioversion  05/23/2011    Procedure: TRANSESOPHAGEAL ECHOCARDIOGRAM (TEE);  Surgeon: Lelon Perla, MD;  Location: [facility];  Service: Cardiovascular;  Laterality: N/A;  . Incision and drainage abscess  05/07/2012    Procedure: INCISION AND DRAINAGE ABSCESS;  Surgeon: Adin Hector, MD;  Location: [facility];  Service: General;  Laterality: N/A;  Groin wound  . Total abdominal hysterectomy  1979  . Breast biopsy  Left  . Bilateral salpingectomy  10/2000  . Breast biopsy  09/18/2009    fibrocystic change  . Coronary artery bypass graft  06/2009  . Pacemaker insertion  08/2012  . Colonoscopy N/A 01/19/2013     Procedure: COLONOSCOPY;  Surgeon: Inda Castle, MD;  Location: [facility];  Service: Endoscopy;  Laterality: N/A;  . Esophagogastroduodenoscopy N/A 01/19/2013    Procedure: ESOPHAGOGASTRODUODENOSCOPY (EGD);  Surgeon: Inda Castle, MD;  Location: [facility];  Service: Endoscopy;  Laterality: N/A;    REVIEW OF SYSTEMS:   Constitutional: Denies fevers, chills or abnormal weight loss Eyes: Denies blurriness of vision Ears, nose, mouth, throat, and face: Denies mucositis or sore throat Respiratory: Denies cough, dyspnea or wheezes Cardiovascular: Denies palpitation, chest discomfort or lower extremity swelling Gastrointestinal:  Denies nausea, heartburn or change in bowel habits Skin: Denies abnormal skin rashes Lymphatics: Denies new lymphadenopathy or easy bruising Neurological:Denies numbness, tingling or new weaknesses Behavioral/Psych: Mood is stable, no new changes  All other systems were reviewed with the patient and are negative.  PHYSICAL EXAMINATION: ECOG PERFORMANCE STATUS: 1 - Symptomatic but completely ambulatory  Blood pressure 153/56, pulse 70, temperature 98.2 F (36.8 C), temperature source Oral, resp. rate 19, height 5\' 3"  (1.6 m), weight 226 lb 3.2 oz (102.604 kg), SpO2 97.00%.   GENERAL:alert, no distress and comfortable; moderately obese, sitting in wheelchair. SKIN: skin color, texture, turgor are normal, no rashes or significant lesions EYES: normal, Conjunctiva are pink and non-injected, sclera clear OROPHARYNX:no exudate, no erythema and lips, buccal mucosa, and tongue normal  NECK: supple, thyroid  normal size, non-tender, without nodularity LYMPH:  no palpable lymphadenopathy in the cervical, axillary or supraclavicular LUNGS: clear to auscultation and percussion with normal breathing effort HEART:  RR with pacemaker in the left infraclavicular area. Chronic lower extremity brawny edema with stasis changes.  ABDOMEN:abdomen soft, non-tender and normal  bowel sounds Musculoskeletal:no cyanosis of digits and no clubbing  NEURO: alert & oriented x 3 with fluent speech, no focal motor/sensory deficits  Labs:  Lab Results  Component Value Date   WBC 7.6 02/20/2014   HGB 9.6* 02/20/2014   HCT 30.4* 02/20/2014   MCV 85.8 02/20/2014   PLT 169 02/20/2014   NEUTROABS 5.5 02/20/2014      Chemistry      Component Value Date/Time   NA 141 02/20/2014 1437   NA 141 02/05/2014 1134   K 4.3 02/20/2014 1437   K 3.8 02/05/2014 1134   CL 102 02/05/2014 1134   CL 105 08/30/2012 1108   CO2 25 02/20/2014 1437   CO2 30 02/05/2014 1134   BUN 40.4* 02/20/2014 1437   BUN 40* 02/05/2014 1134   CREATININE 2.2* 02/20/2014 1437   CREATININE 1.98* 02/05/2014 1134   CREATININE 2.28* 01/22/2014 0429      Component Value Date/Time   CALCIUM 9.0 02/20/2014 1437   CALCIUM 8.5 02/05/2014 1134   ALKPHOS 127 01/23/2014 1341   ALKPHOS 145* 08/07/2012 0445   AST 30 01/23/2014 1341   AST 29 08/07/2012 0445   ALT 19 01/23/2014 1341   ALT 17 08/07/2012 0445   BILITOT 0.47 01/23/2014 1341   BILITOT 0.4 08/07/2012 0445     CBC:  Recent Labs Lab 02/20/14 1437  WBC 7.6  NEUTROABS 5.5  HGB 9.6*  HCT 30.4*  MCV 85.8  PLT 169   Studies:  No results found.   RADIOGRAPHIC STUDIES: No results found.  ASSESSMENT: [name] 80 y.o. female with a history of Acute on chronic renal failure   PLAN:  1. Anemia of chronic renal failure, with some intermittent chronic blood loss due to hemorrhoids.  --She feels tired in general but denies chest discomfort.  Her hemoglobin is 9.6 today stable from 9.9 on discharge on 07/22.  She received her aranesp 200 mcg subcutaneous shot on 07/23.  We will continue giving her aranesp 200 mcg every two weeks  due to increasing blood transfusions requirements. She will continue to have CBCs every 2 weeks and have aranesp as indicated.  Her history is also notable for angiodyplasia and diverticulosis and hemorrhoids resulting in heme positive stool.  We  provided handouts on hemorrhoids and stool softners as a means of prevention.   2. Afib/CHF/Pacer --Followed by cardiology.  Her medtronic biventricular pacemaker was working normally on 12/09 and recently checked 05/18.  She will continue amiodarone 100 mg daily.  3. CKDz. --Her creatinine is 2.2. Creatinine clearance is 23.7 mL/min.   4. MGUS. --Her IgG levels remains stable. We will recheck on next visit. Her IgG level was 1990 on 09/27/2013; it is 1920 on 02/06/2014 with a normal kappa:lamda ratio.  5. Follow-up.   --Patient will return to clinic in 4 weeks for CBC, CMP and iron studies.  She will have labs in 2 weeks for consideration of aranesp.  All questions were answered. The patient knows to call the clinic with any problems, questions or concerns. We can certainly see the patient much sooner if necessary.  I spent 15 minutes counseling the patient face to face. The total time spent in the  appointment was 25 minutes.    Keiyon Plack, MD 02/20/2014 3:32 PM

## 2014-02-24 ENCOUNTER — Encounter (HOSPITAL_COMMUNITY): Payer: Self-pay | Admitting: Emergency Medicine

## 2014-02-24 ENCOUNTER — Ambulatory Visit (INDEPENDENT_AMBULATORY_CARE_PROVIDER_SITE_OTHER): Payer: Medicare Other | Admitting: Cardiology

## 2014-02-24 ENCOUNTER — Inpatient Hospital Stay (HOSPITAL_COMMUNITY)
Admission: EM | Admit: 2014-02-24 | Discharge: 2014-02-27 | DRG: 394 | Disposition: A | Discharge status: Home Health | Payer: Medicare Other | Attending: Internal Medicine | Admitting: Internal Medicine

## 2014-02-24 ENCOUNTER — Encounter: Payer: Self-pay | Admitting: Cardiology

## 2014-02-24 ENCOUNTER — Ambulatory Visit (INDEPENDENT_AMBULATORY_CARE_PROVIDER_SITE_OTHER): Payer: Medicare Other | Admitting: Pharmacist Clinician (PhC)/ Clinical Pharmacy Specialist

## 2014-02-24 VITALS — BP 135/66 | HR 73 | Ht 63.0 in | Wt 223.9 lb

## 2014-02-24 DIAGNOSIS — D631 Anemia in chronic kidney disease: Secondary | ICD-10-CM | POA: Diagnosis present

## 2014-02-24 DIAGNOSIS — K5730 Diverticulosis of large intestine without perforation or abscess without bleeding: Secondary | ICD-10-CM | POA: Diagnosis present

## 2014-02-24 DIAGNOSIS — E785 Hyperlipidemia, unspecified: Secondary | ICD-10-CM | POA: Diagnosis present

## 2014-02-24 DIAGNOSIS — I509 Heart failure, unspecified: Secondary | ICD-10-CM

## 2014-02-24 DIAGNOSIS — I428 Other cardiomyopathies: Secondary | ICD-10-CM | POA: Diagnosis present

## 2014-02-24 DIAGNOSIS — I4891 Unspecified atrial fibrillation: Secondary | ICD-10-CM | POA: Diagnosis present

## 2014-02-24 DIAGNOSIS — Z8249 Family history of ischemic heart disease and other diseases of the circulatory system: Secondary | ICD-10-CM

## 2014-02-24 DIAGNOSIS — D500 Iron deficiency anemia secondary to blood loss (chronic): Secondary | ICD-10-CM | POA: Diagnosis present

## 2014-02-24 DIAGNOSIS — I5022 Chronic systolic (congestive) heart failure: Secondary | ICD-10-CM

## 2014-02-24 DIAGNOSIS — F0390 Unspecified dementia without behavioral disturbance: Secondary | ICD-10-CM | POA: Diagnosis present

## 2014-02-24 DIAGNOSIS — I872 Venous insufficiency (chronic) (peripheral): Secondary | ICD-10-CM | POA: Diagnosis present

## 2014-02-24 DIAGNOSIS — R791 Abnormal coagulation profile: Secondary | ICD-10-CM

## 2014-02-24 DIAGNOSIS — J4489 Other specified chronic obstructive pulmonary disease: Secondary | ICD-10-CM | POA: Diagnosis present

## 2014-02-24 DIAGNOSIS — K921 Melena: Secondary | ICD-10-CM | POA: Diagnosis not present

## 2014-02-24 DIAGNOSIS — J449 Chronic obstructive pulmonary disease, unspecified: Secondary | ICD-10-CM | POA: Diagnosis present

## 2014-02-24 DIAGNOSIS — E1149 Type 2 diabetes mellitus with other diabetic neurological complication: Secondary | ICD-10-CM | POA: Diagnosis present

## 2014-02-24 DIAGNOSIS — N183 Chronic kidney disease, stage 3 unspecified: Secondary | ICD-10-CM | POA: Diagnosis present

## 2014-02-24 DIAGNOSIS — I129 Hypertensive chronic kidney disease with stage 1 through stage 4 chronic kidney disease, or unspecified chronic kidney disease: Secondary | ICD-10-CM | POA: Diagnosis present

## 2014-02-24 DIAGNOSIS — D649 Anemia, unspecified: Secondary | ICD-10-CM

## 2014-02-24 DIAGNOSIS — I480 Paroxysmal atrial fibrillation: Secondary | ICD-10-CM

## 2014-02-24 DIAGNOSIS — K644 Residual hemorrhoidal skin tags: Principal | ICD-10-CM | POA: Diagnosis present

## 2014-02-24 DIAGNOSIS — Z6839 Body mass index (BMI) 39.0-39.9, adult: Secondary | ICD-10-CM

## 2014-02-24 DIAGNOSIS — Z8673 Personal history of transient ischemic attack (TIA), and cerebral infarction without residual deficits: Secondary | ICD-10-CM

## 2014-02-24 DIAGNOSIS — Z800 Family history of malignant neoplasm of digestive organs: Secondary | ICD-10-CM

## 2014-02-24 DIAGNOSIS — Z950 Presence of cardiac pacemaker: Secondary | ICD-10-CM

## 2014-02-24 DIAGNOSIS — Z5181 Encounter for therapeutic drug level monitoring: Secondary | ICD-10-CM

## 2014-02-24 DIAGNOSIS — Z87891 Personal history of nicotine dependence: Secondary | ICD-10-CM

## 2014-02-24 DIAGNOSIS — D472 Monoclonal gammopathy: Secondary | ICD-10-CM | POA: Diagnosis present

## 2014-02-24 DIAGNOSIS — N039 Chronic nephritic syndrome with unspecified morphologic changes: Secondary | ICD-10-CM

## 2014-02-24 DIAGNOSIS — Z880 Allergy status to penicillin: Secondary | ICD-10-CM

## 2014-02-24 DIAGNOSIS — IMO0002 Reserved for concepts with insufficient information to code with codable children: Secondary | ICD-10-CM

## 2014-02-24 DIAGNOSIS — I5042 Chronic combined systolic (congestive) and diastolic (congestive) heart failure: Secondary | ICD-10-CM | POA: Diagnosis present

## 2014-02-24 DIAGNOSIS — I2510 Atherosclerotic heart disease of native coronary artery without angina pectoris: Secondary | ICD-10-CM

## 2014-02-24 DIAGNOSIS — E663 Overweight: Secondary | ICD-10-CM | POA: Diagnosis present

## 2014-02-24 DIAGNOSIS — Z7901 Long term (current) use of anticoagulants: Secondary | ICD-10-CM

## 2014-02-24 DIAGNOSIS — K922 Gastrointestinal hemorrhage, unspecified: Secondary | ICD-10-CM | POA: Diagnosis present

## 2014-02-24 DIAGNOSIS — D638 Anemia in other chronic diseases classified elsewhere: Secondary | ICD-10-CM | POA: Diagnosis present

## 2014-02-24 DIAGNOSIS — Z888 Allergy status to other drugs, medicaments and biological substances status: Secondary | ICD-10-CM

## 2014-02-24 DIAGNOSIS — Z951 Presence of aortocoronary bypass graft: Secondary | ICD-10-CM

## 2014-02-24 DIAGNOSIS — E039 Hypothyroidism, unspecified: Secondary | ICD-10-CM | POA: Diagnosis present

## 2014-02-24 DIAGNOSIS — Z79899 Other long term (current) drug therapy: Secondary | ICD-10-CM

## 2014-02-24 DIAGNOSIS — K625 Hemorrhage of anus and rectum: Secondary | ICD-10-CM

## 2014-02-24 DIAGNOSIS — I252 Old myocardial infarction: Secondary | ICD-10-CM

## 2014-02-24 DIAGNOSIS — N289 Disorder of kidney and ureter, unspecified: Secondary | ICD-10-CM

## 2014-02-24 DIAGNOSIS — K219 Gastro-esophageal reflux disease without esophagitis: Secondary | ICD-10-CM | POA: Diagnosis present

## 2014-02-24 DIAGNOSIS — Z794 Long term (current) use of insulin: Secondary | ICD-10-CM

## 2014-02-24 DIAGNOSIS — I079 Rheumatic tricuspid valve disease, unspecified: Secondary | ICD-10-CM | POA: Diagnosis present

## 2014-02-24 DIAGNOSIS — Z841 Family history of disorders of kidney and ureter: Secondary | ICD-10-CM

## 2014-02-24 DIAGNOSIS — E1142 Type 2 diabetes mellitus with diabetic polyneuropathy: Secondary | ICD-10-CM | POA: Diagnosis present

## 2014-02-24 DIAGNOSIS — I359 Nonrheumatic aortic valve disorder, unspecified: Secondary | ICD-10-CM | POA: Diagnosis present

## 2014-02-24 LAB — CBC WITH DIFFERENTIAL/PLATELET
BASOS PCT: 1 % (ref 0–1)
Basophils Absolute: 0 10*3/uL (ref 0.0–0.1)
EOS ABS: 0.2 10*3/uL (ref 0.0–0.7)
Eosinophils Relative: 2 % (ref 0–5)
HCT: 22.8 % — ABNORMAL LOW (ref 36.0–46.0)
Hemoglobin: 7.2 g/dL — ABNORMAL LOW (ref 12.0–15.0)
Lymphocytes Relative: 25 % (ref 12–46)
Lymphs Abs: 1.9 10*3/uL (ref 0.7–4.0)
MCH: 27.7 pg (ref 26.0–34.0)
MCHC: 31.6 g/dL (ref 30.0–36.0)
MCV: 87.7 fL (ref 78.0–100.0)
MONOS PCT: 14 % — AB (ref 3–12)
Monocytes Absolute: 1.1 10*3/uL — ABNORMAL HIGH (ref 0.1–1.0)
NEUTROS PCT: 58 % (ref 43–77)
Neutro Abs: 4.5 10*3/uL (ref 1.7–7.7)
Platelets: 160 10*3/uL (ref 150–400)
RBC: 2.6 MIL/uL — ABNORMAL LOW (ref 3.87–5.11)
RDW: 18.5 % — AB (ref 11.5–15.5)
WBC: 7.6 10*3/uL (ref 4.0–10.5)

## 2014-02-24 LAB — BASIC METABOLIC PANEL
Anion gap: 15 (ref 5–15)
BUN: 58 mg/dL — ABNORMAL HIGH (ref 6–23)
CALCIUM: 8.3 mg/dL — AB (ref 8.4–10.5)
CO2: 25 meq/L (ref 19–32)
CREATININE: 1.82 mg/dL — AB (ref 0.50–1.10)
Chloride: 101 mEq/L (ref 96–112)
GFR calc Af Amer: 29 mL/min — ABNORMAL LOW (ref >90)
GFR calc non Af Amer: 25 mL/min — ABNORMAL LOW (ref >90)
GLUCOSE: 225 mg/dL — AB (ref 70–99)
Potassium: 3.6 mEq/L — ABNORMAL LOW (ref 3.7–5.3)
Sodium: 141 mEq/L (ref 137–147)

## 2014-02-24 LAB — PROTIME-INR
INR: 7.71 — AB (ref 0.00–1.49)
Prothrombin Time: 65.1 seconds — ABNORMAL HIGH (ref 11.6–15.2)

## 2014-02-24 LAB — POCT INR: INR: 8

## 2014-02-24 MED ORDER — PHYTONADIONE 5 MG PO TABS
5.0000 mg | ORAL_TABLET | Freq: Once | ORAL | Status: AC
  Administered 2014-02-24: 5 mg via ORAL
  Filled 2014-02-24: qty 1

## 2014-02-24 NOTE — ED Notes (Signed)
Pt from PCP sent over for eval of increased INR greater than 8; pt sts some bleeding from hemorrhoids on a regular basis that has increased some; denies other complaint

## 2014-02-24 NOTE — ED Notes (Signed)
No changes, pt alert, NAD, calm, interactive, family has left the BS (gone home), Dr. Dagmar Hait admitting MD in to see, assess & admit pt. VSS. No dyspnea noted.

## 2014-02-24 NOTE — ED Notes (Signed)
Admitting physician at bedside

## 2014-02-24 NOTE — ED Provider Notes (Signed)
CSN: [id number]     Arrival date & time 02/24/14  1708 History   First MD Initiated Contact with Patient 02/24/14 2203     Chief Complaint  Patient presents with  . Abnormal Lab     (Consider location/radiation/quality/duration/timing/severity/associated sxs/prior Treatment) The history is provided by the patient.   80 year old female who comes in because of elevated INR. She is taking warfarin for atrial fibrillation and has been having problems with bleeding hemorrhoids. Over the last several days, bleeding has been much heavier than it had been. She denies rectal pain, abdominal pain, nausea, vomiting. She denies dizziness or lightheadedness. She had routine INR checked and it came back greater than 8 and was told to come to the ED. She does have known hemorrhoids.  Past Medical History  Diagnosis Date  . Coronary artery disease     a. Severe two vessel; s/p CABG (LIMA-LAD, SVG-diag, SVG-PDA);  b. 05/2012 NSTEMI in setting of rapid aflutter;  c. 06/2012 Lexiscan cardiolite: small, mild reversible defect in lateral wall->mild ischemia, low-risk->med Rx.  Marland Kitchen Hypertension   . Type II diabetes mellitus   . Hyperlipidemia   . Diabetic neuropathy   . CVA (cerebral vascular accident) 2004  . Hypothyroidism   . Chronic anemia   . GERD (gastroesophageal reflux disease)   . Arthritis   . Memory loss, short term   . COPD (chronic obstructive pulmonary disease)   . Blood transfusion without reported diagnosis   . Chronic combined systolic and diastolic CHF (congestive heart failure)     a. 07/2012 Echo: EF 30%, Gr II DD, Mild AS/MR, Mod-Sev TR, mild bi-atrial and RV dil, PASP 52mmHg.  . Moderate mitral regurgitation     a. mod by TEE 2012, mild by echo 2013, 07/2012  . Severe tricuspid regurgitation     a. Severe by TEE 2012, mod by echo 2013, mod-sev by echo 07/2012  . Chronic vulvitis 07/1993  . Idiopathic anemia 2006  . Atrial fibrillation     a. Failed amiodarone (prolonged QT), not a  candidate for class IC meds due to her CAD. No Multaq due to CHF hx;  b. 07/2012 s/p SJM Anthem RF Bi-V PPM, ser # [id number] and AV node RFCA;  c. chronic coumadin.  . Atrial flutter   . Pacemaker   . GI bleed     a. 10/2011 - Dr Benson Norway did scope which showed hiatal hernia, colon revealed diverticula (diverticular bleed), internal/ext hemorrhoids.   Past Surgical History  Procedure Laterality Date  . Coronary artery bypass graft  06/2009    X3, LIMA to LAD, SVG to diagonal, SVG to the posterior descending artery.  . Tonsillectomy    . Knee arthroscopy  08/2001  . Transthoracic echocardiogram  11/12/2010     Ejection fraction felt to be around 50%.  Wall thickness was increased in a pattern of mild LVH.  Moderately dilated left atrium.  Mildly dilated right atrium. Right ventricle was mildly dilated with mildly reduced systolic function  . Cardiac catheterization  06/09/2009    inferior wall hypokinesia with ejection fraction of 55%.  . Abdominal hysterectomy    . Joint replacement    . Tee without cardioversion  05/23/2011    Procedure: TRANSESOPHAGEAL ECHOCARDIOGRAM (TEE);  Surgeon: Lelon Perla, MD;  Location: [facility];  Service: Cardiovascular;  Laterality: N/A;  . Incision and drainage abscess  05/07/2012    Procedure: INCISION AND DRAINAGE ABSCESS;  Surgeon: Adin Hector, MD;  Location: [facility];  Service:  General;  Laterality: N/A;  Groin wound  . Total abdominal hysterectomy  1979  . Breast biopsy  Left  . Bilateral salpingectomy  10/2000  . Breast biopsy  09/18/2009    fibrocystic change  . Coronary artery bypass graft  06/2009  . Pacemaker insertion  08/2012  . Colonoscopy N/A 01/19/2013    Procedure: COLONOSCOPY;  Surgeon: Inda Castle, MD;  Location: [facility];  Service: Endoscopy;  Laterality: N/A;  . Esophagogastroduodenoscopy N/A 01/19/2013    Procedure: ESOPHAGOGASTRODUODENOSCOPY (EGD);  Surgeon: Inda Castle, MD;  Location: [facility];  Service: Endoscopy;   Laterality: N/A;   Family History  Problem Relation Age of Onset  . Colon cancer Mother   . Stroke Mother   . Hypertension Mother   . Cancer Mother     breast  . Cancer Father     colon  . Heart attack Father     x2  . Renal Disease Sister     dialysis-twin sister  . Renal Disease Sister     renal bypass   History  Substance Use Topics  . Smoking status: Former Smoker    Types: Cigarettes  . Smokeless tobacco: Never Used  . Alcohol Use: No   OB History   Grav Para Term Preterm Abortions TAB SAB Ect Mult Living   2 2 2       2      Review of Systems  All other systems reviewed and are negative.     Allergies  Penicillins; Betadine; Capoten; Codone; Methadone; and Propoxyphene and methadone  Home Medications   Prior to Admission medications   Medication Sig Start Date End Date Taking? Authorizing Provider  amiodarone (PACERONE) 200 MG tablet Take 100 mg by mouth daily.   Yes Historical Provider, MD  amitriptyline (ELAVIL) 25 MG tablet Take 75 mg by mouth at bedtime.     Yes Historical Provider, MD  atorvastatin (LIPITOR) 40 MG tablet Take 40 mg by mouth every evening.    Yes Historical Provider, MD  diltiazem (CARDIZEM CD) 240 MG 24 hr capsule Take 240 mg by mouth daily.   Yes Historical Provider, MD  donepezil (ARICEPT) 10 MG tablet Take 10 mg by mouth at bedtime.   Yes Historical Provider, MD  esomeprazole (NEXIUM) 40 MG capsule Take 40 mg by mouth daily before breakfast.     Yes Historical Provider, MD  ezetimibe (ZETIA) 10 MG tablet Take 10 mg by mouth every evening.    Yes Historical Provider, MD  furosemide (LASIX) 20 MG tablet Take 40-60 mg by mouth 2 (two) times daily. 60 mg in the am and 40 mg in the pm 01/22/14  Yes Eileen Stanford, PA-C  insulin glargine (LANTUS) 100 UNIT/ML injection Inject 50 Units into the skin 2 (two) times daily.    Yes Historical Provider, MD  insulin lispro (HUMALOG) 100 UNIT/ML injection Inject 20-25 Units into the skin 2 (two)  times daily as needed for high blood sugar. Home sliding scale If blood sugar is <200 take 20 units If blood sugar is >200 take 25 units If blood sugar is less than 70, don't take any   Yes Historical Provider, MD  levothyroxine (SYNTHROID, LEVOTHROID) 100 MCG tablet Take 100 mcg by mouth every morning.    Yes Historical Provider, MD  metoprolol succinate (TOPROL-XL) 50 MG 24 hr tablet Take 50 mg by mouth daily. Take with or immediately following a meal.   Yes Historical Provider, MD  Multiple Vitamins-Minerals (ICAPS AREDS FORMULA  PO) Take 1 tablet by mouth 2 (two) times daily.   Yes Historical Provider, MD  nitroGLYCERIN (NITROSTAT) 0.4 MG SL tablet Place 0.4 mg under the tongue every 5 (five) minutes as needed. For chest pain   Yes Historical Provider, MD  potassium chloride SA (K-DUR,KLOR-CON) 20 MEQ tablet Take 2 tablets (40 mEq total) by mouth 2 (two) times daily. 01/22/14  Yes Eileen Stanford, PA-C  warfarin (COUMADIN) 5 MG tablet Take 7.5-10 mg by mouth daily. Take 10mg  by mouth on Sunday, Monday, Wednesday, Friday and Saturday. Take 7.5mg  by mouth on Tuesday and Thursday.   Yes Historical Provider, MD   BP 131/63  Pulse 71  Temp(Src) 97.9 F (36.6 C) (Oral)  Resp 18  SpO2 98% Physical Exam  Nursing note and vitals reviewed.  80 year old female, resting comfortably and in no acute distress. Vital signs are normal. Oxygen saturation is 98%, which is normal. Head is normocephalic and atraumatic. PERRLA, EOMI. Oropharynx is clear. Neck is nontender and supple without adenopathy or JVD. Back is nontender and there is no CVA tenderness. Lungs are clear without rales, wheezes, or rhonchi. Chest is nontender. Heart has regular rate and rhythm without murmur. Abdomen is soft, flat, nontender without masses or hepatosplenomegaly and peristalsis is normoactive. Rectal: External hemorrhoids present with slight oozing of bright red blood seen. Extremities have 2+ edema, full range of  motion is present. Skin is warm and dry without rash. Neurologic: Mental status is normal, cranial nerves are intact, there are no motor or sensory deficits.  ED Course  Procedures (including critical care time) Labs Review Results for orders placed during the hospital encounter of 02/24/14  CBC WITH DIFFERENTIAL      Result Value Ref Range   WBC 7.6  4.0 - 10.5 K/uL   RBC 2.60 (*) 3.87 - 5.11 MIL/uL   Hemoglobin 7.2 (*) 12.0 - 15.0 g/dL   HCT 22.8 (*) 36.0 - 46.0 %   MCV 87.7  78.0 - 100.0 fL   MCH 27.7  26.0 - 34.0 pg   MCHC 31.6  30.0 - 36.0 g/dL   RDW 18.5 (*) 11.5 - 15.5 %   Platelets 160  150 - 400 K/uL   Neutrophils Relative % 58  43 - 77 %   Neutro Abs 4.5  1.7 - 7.7 K/uL   Lymphocytes Relative 25  12 - 46 %   Lymphs Abs 1.9  0.7 - 4.0 K/uL   Monocytes Relative 14 (*) 3 - 12 %   Monocytes Absolute 1.1 (*) 0.1 - 1.0 K/uL   Eosinophils Relative 2  0 - 5 %   Eosinophils Absolute 0.2  0.0 - 0.7 K/uL   Basophils Relative 1  0 - 1 %   Basophils Absolute 0.0  0.0 - 0.1 K/uL  PROTIME-INR      Result Value Ref Range   Prothrombin Time 65.1 (*) 11.6 - 15.2 seconds   INR 7.71 (*) 0.00 - 0.34  BASIC METABOLIC PANEL      Result Value Ref Range   Sodium 141  137 - 147 mEq/L   Potassium 3.6 (*) 3.7 - 5.3 mEq/L   Chloride 101  96 - 112 mEq/L   CO2 25  19 - 32 mEq/L   Glucose, Bld 225 (*) 70 - 99 mg/dL   BUN 58 (*) 6 - 23 mg/dL   Creatinine, Ser 1.82 (*) 0.50 - 1.10 mg/dL   Calcium 8.3 (*) 8.4 - 10.5 mg/dL   GFR  calc non Af Amer 25 (*) >90 mL/min   GFR calc Af Amer 29 (*) >90 mL/min   Anion gap 15  5 - 15   MDM   Final diagnoses:  Rectal bleeding  Supratherapeutic INR  Anemia due to blood loss  Renal insufficiency    Rectal bleeding in the setting of elevated INR. There has been a drop in hemoglobin of greater than 2 g over the last 4 days. Renal insufficiency is present with a creatinine essentially unchanged the BUN slightly higher than baseline. She will need to be  admitted for hemodynamic monitoring and serial hemoglobins and may need  Blood transfusions. She will be given oral vitamin K to reverse her elevated INR. Case is discussed with Dr. Dagmar Hait, on call for Dr. Virgina Jock, who will come to evaluate the patient.    Delora Fuel, MD 31/43/88 [id number]

## 2014-02-24 NOTE — Progress Notes (Signed)
02/24/2014 [name]   1933/06/04  [id number]  Primary Physicia [name], MD Primary Cardiologist: Dr. Martinique  HPI:  The patient is a 80 y/o female, followed by Dr. Martinique but Dr. Lovena Le sees her as well. She has a history of tachycardia mediated cardiomyopathy, and was thought to have chronic atrial fibrillation. She is on chronic warfarin therapy. She underwent AV node ablation and biventricular pacemaker insertion one year ago. Her ejection fraction improved from 30% to 45%. She also has known CAD, s/p CABG (LIMA-LAD, SVG-diag and SVG-PDA). Her last nuclear stress test was 06/08/2012 and was low risk.   She presents to clinic today for evaluation of her heart failure. She was seen in clinic 3 weeks ago for post hospital followup after an admission for acute on chronic systolic CHF. That day in clinic, she noted increased dyspnea on exertion as well as weight gain and lower extremity edema. Labs were obtained and her serum creatinine was stable at 1.98 (baseline is 2.3). Potassium was also stable at 3.8. Her diuretic dose was temporarily increased to 80 mg twice a day x3 days for increased diuresis. She was also instructed to temporarily increase her potassium x3 days.   Today in followup, her symptoms have improved. She continues to have mild dyspnea on exertion but no dyspnea at rest. She continues to sleep with 2 pillows, which is her normal routine. She denies orthopnea. Her lower extremity edema has resolved. Her weight is down 4 pounds since her last office visit. Her weight today is 223 pounds (was 227 on 02/05/2014). She has returned to her previously prescribed dose of diuretic, 60 mg of Lasix in the a.m. and 40 mg in the evening. He was on ordered to undergo repeat blood work. A repeat BMET was obtained 4 days ago revealing stable serum creatinine at 2.2. Potassium was also within normal limits at 4.3. A CBC was also checked and her hemoglobin was 9.6, down from 10.6 2 weeks  prior.  Her main concern today is signs of melena as well as slight hematochezia, ongoing for the last several days. She does admit to hemorrhoids  and has been straining during BMs, which may also be contributing. She feels tired constantly. Her INR was checked in clinic today by [name], RPH-CPP.  Her INR is supratherapeutic at 8.0.    Current Outpatient Prescriptions  Medication Sig Dispense Refill  . amiodarone (PACERONE) 200 MG tablet Take 100 mg by mouth daily.      Marland Kitchen amitriptyline (ELAVIL) 25 MG tablet Take 75 mg by mouth at bedtime.        Marland Kitchen atorvastatin (LIPITOR) 40 MG tablet Take 40 mg by mouth every evening.       . benzonatate (TESSALON) 200 MG capsule Take 1 capsule (200 mg total) by mouth 3 (three) times daily.  20 capsule  0  . diltiazem (CARDIZEM CD) 240 MG 24 hr capsule Take 240 mg by mouth daily.      Marland Kitchen donepezil (ARICEPT) 10 MG tablet Take 10 mg by mouth at bedtime.      Marland Kitchen esomeprazole (NEXIUM) 40 MG capsule Take 40 mg by mouth daily before breakfast.        . ezetimibe (ZETIA) 10 MG tablet Take 10 mg by mouth every evening.       . furosemide (LASIX) 20 MG tablet Take 60 mg by mouth 2 (two) times daily. 60 mg in the am and 40 mg in the pm      .  insulin glargine (LANTUS) 100 UNIT/ML injection Inject 50 Units into the skin 2 (two) times daily.       . insulin lispro (HUMALOG) 100 UNIT/ML injection Inject 20-25 Units into the skin 2 (two) times daily as needed for high blood sugar. Home sliding scale If blood sugar is <200 take 20 units If blood sugar is >200 take 25 units If blood sugar is less than 70, don't take any      . levothyroxine (SYNTHROID, LEVOTHROID) 100 MCG tablet Take 100 mcg by mouth every morning.       . metoprolol succinate (TOPROL-XL) 50 MG 24 hr tablet Take 50 mg by mouth daily. Take with or immediately following a meal.      . Multiple Vitamins-Minerals (ICAPS AREDS FORMULA PO) Take 1 tablet by mouth 2 (two) times daily.      . nitroGLYCERIN  (NITROSTAT) 0.4 MG SL tablet Place 0.4 mg under the tongue every 5 (five) minutes as needed. For chest pain      . potassium chloride SA (K-DUR,KLOR-CON) 20 MEQ tablet Take 2 tablets (40 mEq total) by mouth 2 (two) times daily.  60 tablet  11  . starch (ANUSOL) 51 % suppository Place 1 suppository rectally as needed for pain.  24 suppository  0  . warfarin (COUMADIN) 5 MG tablet Take 7.5-10 mg by mouth daily. Take 10mg  by mouth on Sunday, Monday, Wednesday, Friday and Saturday. Take 7.5mg  by mouth on Tuesday and Thursday.      . [DISCONTINUED] enoxaparin (LOVENOX) 100 MG/ML SOLN Inject into the skin every 12 (twelve) hours.        . [DISCONTINUED] simvastatin (ZOCOR) 80 MG tablet Take 80 mg by mouth at bedtime.         No current facility-administered medications for this visit.    Allergies  Allergen Reactions  . Betadine [Povidone Iodine] Itching  . Capoten [Captopril] Other (See Comments)    unknown  . Codone [Hydrocodone] Nausea And Vomiting  . Methadone Nausea And Vomiting  . Penicillins Swelling  . Propoxyphene And Methadone     Intolerance to darvocet    History   Social History  . Marital Status: Married    Spouse Name: N/A    Number of Children: 2  . Years of Education: N/A   Occupational History  . teacher    Social History Main Topics  . Smoking status: Former Smoker    Types: Cigarettes  . Smokeless tobacco: Never Used  . Alcohol Use: No  . Drug Use: No  . Sexual Activity: No     Comment: hysterectomy   Other Topics Concern  . Not on file   Social History Narrative   Lives in [place] with spouse     Review of Systems: General: negative for chills, fever, night sweats or weight changes.  Cardiovascular: negative for chest pain, dyspnea on exertion, edema, orthopnea, palpitations, paroxysmal nocturnal dyspnea or shortness of breath Dermatological: negative for rash Respiratory: negative for cough or wheezing Urologic: negative for  hematuria Abdominal: negative for nausea, vomiting, diarrhea, bright red blood per rectum, melena, or hematemesis Neurologic: negative for visual changes, syncope, or dizziness All other systems reviewed and are otherwise negative except as noted above.    Blood pressure 135/66, pulse 73, height 5\' 3"  (1.6 m), weight 223 lb 14.4 oz (101.56 kg).  General appearance: alert, cooperative, no distress and moderately obese Neck: no carotid bruit and slight JVD Lungs: clear to auscultation bilaterally Heart: regular rate and rhythm and  3/6 SM at the R and LUSB and Apex Extremities: no pitting edema Pulses: 2+ and symmetric Skin: warm and dry Neurologic: Grossly normal  EKG Not performed  ASSESSMENT AND PLAN:   1. CHF: From a volume standpoint, her condition appears much improved compared to 2 weeks ago. Peripheral edema has resolved. Lungs are clear to auscultation bilaterally. She continues to have mild dyspnea on exertion, but this is also in the setting of chronic anemia. Last hemoglobin was 9.6. BMP reveals that renal function and electrolytes have remained fairly stable after recent temporary increase in diuretic. She is now on her prior dose of 60 mg of Lasix in the a.m. and 40 mg in the evening. His been instructed to continue on his current regimen and to continue with daily weights and low-sodium diet.  2. Supratherapeutic INR: INR is 8! I have discussed case with our office pharmacists, [name]. Given her melena/rectal bleeding we both feel that it is best that she directly proceed to the [facility] Emergency department for evaluation for GI bleed. [name] has also recommended that vitamin K be given to reverse the effects of Coumadin given her potential bleed.  3. ? GIB: Will send patient to the [facility] Emergency Department for further evaluation/ management.    PLAN  Will send to  [facility] ER for evaluation as mentioned above.  Continue current treatment plan for  heart failure and followup with Dr. Martinique in 3 months for reevaluation or sooner if needed.  SIMMONS, BRITTAINYPA-C 02/24/2014 3:55 PM

## 2014-02-24 NOTE — H&P (Signed)
PCP:   [name], MD   Chief Complaint:  Rectal bleeding in the setting of an elevated PT/INR  HPI: 80 year old female followed by Dr. Martinique and Dr. Lovena Le of cardiology as well as Dr. Virgina Jock from her primary care perspective, his multiple medical problems including chronic atrial fibrillation, cardiomyopathy, on chronic warfarin therapy with history of AV node ablation, biventricular pacemaker insertion approximately one year ago with an ejection fraction thought to be around 30-45% in the setting of known CAD status post CABG, last nuclear stress test 12/13 with low risk. She's had a recent evaluation and hospitalization for heart failure requiring diuresis complicated by smoldering GI bleed, followed by Dr. Benson Norway from a GI perspective, and indeed it does have a history of diverticulosis with possible bleed as well as known hemorrhoids in the setting of her anticoagulation which do bleed on occasion. She's also has anemia of chronic disease followed by Dr. Payton Emerald with Aranesp injections up to twice monthly. She's had numerous adjustments for diuretics by cardiology. This is based on volume status as well as weight. She has chronic dyspnea on exertion none at rest the and 2 pillow orthopnea. She is an insulin-dependent diabetic and self administers insulin twice a day, with no history of recent hypoglycemia some exacerbations based on caloric intake. She was seen earlier today by [facility] at [facility], was thought to be stable for followup in the next 3 minutes by Dr. Martinique however she was also seen by Coumadin clinic given worsening hematochezia this morning but ongoing for the last several weeks to a minor extent is admitted to having hemorrhoids and straining during bowel movements. She does feel tired, and her INR was found to be 8.0. Given the situation, it was recommended that she go to the emergency room for further evaluation and management her hemoglobin had dropped in the last 4  days has dictated below, and she is now being admitted for reversal of her PT/INR and stabilization. Currently she is hemodynamically stable the  Review of Systems:  Somewhat limited based on short-term memory deficits, family is no longer present in the emergency room, however negative for fevers, chills, significant weight changes, negative for new visual changes, chest pain, but does have some occasional palpitations dyspnea on exertion but no dyspnea at rest, edema stable, denies worsening orthopnea. Denies nausea, vomiting, diarrhea, but does admit to some hard stools, with blood especially earlier this morning, slowed down significantly over the last few hours denies any dizziness. Does state that her last colonoscopy was roughly several months ago, denies any dysuria, gross hematuria, and states that her edema stable, denies any presyncope or syncope or dizziness or focal neurologic deficit . No recent injuries to the lower extremities despite history of neuropathy  Past Medical History: Past Medical History  Diagnosis Date  . Coronary artery disease     a. Severe two vessel; s/p CABG (LIMA-LAD, SVG-diag, SVG-PDA);  b. 05/2012 NSTEMI in setting of rapid aflutter;  c. 06/2012 Lexiscan cardiolite: small, mild reversible defect in lateral wall->mild ischemia, low-risk->med Rx.  Marland Kitchen Hypertension   . Type II diabetes mellitus   . Hyperlipidemia   . Diabetic neuropathy   . CVA (cerebral vascular accident) 2004  . Hypothyroidism   . Chronic anemia   . GERD (gastroesophageal reflux disease)   . Arthritis   . Memory loss, short term   . COPD (chronic obstructive pulmonary disease)   . Blood transfusion without reported diagnosis   . Chronic combined  systolic and diastolic CHF (congestive heart failure)     a. 07/2012 Echo: EF 30%, Gr II DD, Mild AS/MR, Mod-Sev TR, mild bi-atrial and RV dil, PASP 12mmHg.  . Moderate mitral regurgitation     a. mod by TEE 2012, mild by echo 2013, 07/2012  . Severe  tricuspid regurgitation     a. Severe by TEE 2012, mod by echo 2013, mod-sev by echo 07/2012  . Chronic vulvitis 07/1993  . Idiopathic anemia 2006  . Atrial fibrillation     a. Failed amiodarone (prolonged QT), not a candidate for class IC meds due to her CAD. No Multaq due to CHF hx;  b. 07/2012 s/p SJM Anthem RF Bi-V PPM, ser # [id number] and AV node RFCA;  c. chronic coumadin.  . Atrial flutter   . Pacemaker   . GI bleed     a. 10/2011 - Dr Benson Norway did scope which showed hiatal hernia, colon revealed diverticula (diverticular bleed), internal/ext hemorrhoids.   Past Surgical History  Procedure Laterality Date  . Coronary artery bypass graft  06/2009    X3, LIMA to LAD, SVG to diagonal, SVG to the posterior descending artery.  . Tonsillectomy    . Knee arthroscopy  08/2001  . Transthoracic echocardiogram  11/12/2010     Ejection fraction felt to be around 50%.  Wall thickness was increased in a pattern of mild LVH.  Moderately dilated left atrium.  Mildly dilated right atrium. Right ventricle was mildly dilated with mildly reduced systolic function  . Cardiac catheterization  06/09/2009    inferior wall hypokinesia with ejection fraction of 55%.  . Abdominal hysterectomy    . Joint replacement    . Tee without cardioversion  05/23/2011    Procedure: TRANSESOPHAGEAL ECHOCARDIOGRAM (TEE);  Surgeon: Lelon Perla, MD;  Location: [facility];  Service: Cardiovascular;  Laterality: N/A;  . Incision and drainage abscess  05/07/2012    Procedure: INCISION AND DRAINAGE ABSCESS;  Surgeon: Adin Hector, MD;  Location: [facility];  Service: General;  Laterality: N/A;  Groin wound  . Total abdominal hysterectomy  1979  . Breast biopsy  Left  . Bilateral salpingectomy  10/2000  . Breast biopsy  09/18/2009    fibrocystic change  . Coronary artery bypass graft  06/2009  . Pacemaker insertion  08/2012  . Colonoscopy N/A 01/19/2013    Procedure: COLONOSCOPY;  Surgeon: Inda Castle, MD;  Location: [facility]  [facility];  Service: Endoscopy;  Laterality: N/A;  . Esophagogastroduodenoscopy N/A 01/19/2013    Procedure: ESOPHAGOGASTRODUODENOSCOPY (EGD);  Surgeon: Inda Castle, MD;  Location: [facility];  Service: Endoscopy;  Laterality: N/A;    Medications: Prior to Admission medications   Medication Sig Start Date End Date Taking? Authorizing Provider  amiodarone (PACERONE) 200 MG tablet Take 100 mg by mouth daily.   Yes Historical Provider, MD  amitriptyline (ELAVIL) 25 MG tablet Take 75 mg by mouth at bedtime.     Yes Historical Provider, MD  atorvastatin (LIPITOR) 40 MG tablet Take 40 mg by mouth every evening.    Yes Historical Provider, MD  diltiazem (CARDIZEM CD) 240 MG 24 hr capsule Take 240 mg by mouth daily.   Yes Historical Provider, MD  donepezil (ARICEPT) 10 MG tablet Take 10 mg by mouth at bedtime.   Yes Historical Provider, MD  esomeprazole (NEXIUM) 40 MG capsule Take 40 mg by mouth daily before breakfast.     Yes Historical Provider, MD  ezetimibe (ZETIA) 10 MG tablet Take 10 mg  by mouth every evening.    Yes Historical Provider, MD  furosemide (LASIX) 20 MG tablet Take 40-60 mg by mouth 2 (two) times daily. 60 mg in the am and 40 mg in the pm 01/22/14  Yes Eileen Stanford, PA-C  insulin glargine (LANTUS) 100 UNIT/ML injection Inject 50 Units into the skin 2 (two) times daily.    Yes Historical Provider, MD  insulin lispro (HUMALOG) 100 UNIT/ML injection Inject 20-25 Units into the skin 2 (two) times daily as needed for high blood sugar. Home sliding scale If blood sugar is <200 take 20 units If blood sugar is >200 take 25 units If blood sugar is less than 70, don't take any   Yes Historical Provider, MD  levothyroxine (SYNTHROID, LEVOTHROID) 100 MCG tablet Take 100 mcg by mouth every morning.    Yes Historical Provider, MD  metoprolol succinate (TOPROL-XL) 50 MG 24 hr tablet Take 50 mg by mouth daily. Take with or immediately following a meal.   Yes Historical Provider, MD   Multiple Vitamins-Minerals (ICAPS AREDS FORMULA PO) Take 1 tablet by mouth 2 (two) times daily.   Yes Historical Provider, MD  nitroGLYCERIN (NITROSTAT) 0.4 MG SL tablet Place 0.4 mg under the tongue every 5 (five) minutes as needed. For chest pain   Yes Historical Provider, MD  potassium chloride SA (K-DUR,KLOR-CON) 20 MEQ tablet Take 2 tablets (40 mEq total) by mouth 2 (two) times daily. 01/22/14  Yes Eileen Stanford, PA-C  warfarin (COUMADIN) 5 MG tablet Take 7.5-10 mg by mouth daily. Take 10mg  by mouth on Sunday, Monday, Wednesday, Friday and Saturday. Take 7.5mg  by mouth on Tuesday and Thursday.   Yes Historical Provider, MD    Allergies:   Allergies  Allergen Reactions  . Penicillins Swelling  . Betadine [Povidone Iodine] Itching  . Capoten [Captopril] Other (See Comments)    unknown  . Codone [Hydrocodone] Nausea And Vomiting  . Methadone Nausea And Vomiting  . Propoxyphene And Methadone Other (See Comments)    Intolerance to darvocet    Social History:  reports that she has quit smoking. Her smoking use included Cigarettes. She smoked 0.00 packs per day. She has never used smokeless tobacco. She reports that she does not drink alcohol or use illicit drugs.  Family History: Family History  Problem Relation Age of Onset  . Colon cancer Mother   . Stroke Mother   . Hypertension Mother   . Cancer Mother     breast  . Cancer Father     colon  . Heart attack Father     x2  . Renal Disease Sister     dialysis-twin sister  . Renal Disease Sister     renal bypass    Physical Exam: Filed Vitals:   02/24/14 1718 02/24/14 2205 02/24/14 2230 02/24/14 2245  BP: 131/63  135/52 134/51  Pulse: 71 75 74 74  Temp: 97.9 F (36.6 C) 98.5 F (36.9 C)    TempSrc: Oral Oral    Resp: 18 25 20 13   SpO2: 98% 98% 97% 97%   Obese female in no apparent distress sitting up in bed, conversant, answering questions but with some short-term memory deficits follows commands without  hindrance no respiratory distress Well groomed, and head normocephalic atraumatic Face symmetric, tongue midline, no oropharyngeal lesions Neck supple, no cervical lymphadenopathy, no JVD appreciated Lungs clear to auscultation bilaterally Cardiovascular reveals regular rate and rhythm with 3/6 systolic murmur -Soft, nontender, nondistended, bowel sounds present Rectal exam performed by  emergency room physician revealing external hemorrhoids present with slight oozing of bright red blood Thickened skin on the extremities, 1-2+ edema edema pulses intact, skin warm and dry, ichthyosis type changes present Neurologic exam grossly nonfocal alert and oriented, to situation, place, reason why she is here, follows all commands, no resting tremors, strength intact in all 4 extremities full range of motion no significant significant deficits to light touch    Labs on Admission:   Recent Labs  02/24/14 2136  NA 141  K 3.6*  CL 101  CO2 25  GLUCOSE 225*  BUN 58*  CREATININE 1.82*  CALCIUM 8.3*   No results found for this basename: AST, ALT, ALKPHOS, BILITOT, PROT, ALBUMIN,  in the last 72 hours No results found for this basename: LIPASE, AMYLASE,  in the last 72 hours  Recent Labs  02/24/14 2136  WBC 7.6  NEUTROABS 4.5  HGB 7.2*  HCT 22.8*  MCV 87.7  PLT 160   No results found for this basename: CKTOTAL, CKMB, CKMBINDEX, TROPONINI,  in the last 72 hours No results found for this basename: TSH, T4TOTAL, FREET3, T3FREE, THYROIDAB,  in the last 72 hours No results found for this basename: VITAMINB12, FOLATE, FERRITIN, TIBC, IRON, RETICCTPCT,  in the last 72 hours  Radiological Exams on Admission: No results found. Orders placed during the hospital encounter of 01/16/14  . EKG 12-LEAD  . EKG 12-LEAD  . EKG 12-LEAD  . EKG 12-LEAD  . ED EKG  . ED EKG  . ED EKG  . ED EKG  . EKG 12-LEAD  . EKG 12-LEAD  . EKG  . EKG    Assessment/Plan GI bleed in the setting of elevated  PT/INR, presumably secondary to oozing and bleeding hemorrhoids in conjunction with a history of diverticulosis, currently hemodynamically stable, with no evidence of significant bleeding several hours, vitamin K has been administered in emergency room, we'll continue to follow with serial CBCs, and monitor for 2 stabilization. Low threshold to call gastroenterology or pursue more aggressive reversal process with fresh frozen plasma. Hgb was 9.6 4 days ago now 7.6. Baseline range from 9.6-10.6. Anticoagulation by [facility], PT/INR greater than 8.0, 5 mg of vitamin K his are to be given to the patient, will monitor for reversal and further bleeding. Congestive heart failure class IV, extensive records reviewed, ejection fraction thought to be between 30-45% in the setting of known coronary artery disease and chronic atrial fibrillation, heart rate well controlled at this time does have a dual-chamber pacemaker. Appears clinically stable based on this cardiology and current assessment. We'll continue current medications and monitor volume status carefully. Type 2 diabetes insulin-dependent-insulin-dependent, will place patient on a clear liquid diet, and reduce insulin appropriate Chronic kidney disease-and review of old records, appears stable, will continue current medications including diuretics to Anemia of chronic disease-managed by  hematology but primary care physician, the setting of anticoagulation with Coumadin, getting Aranesp injections on regular basis for low hemoglobin threshold.   Regino Fournet R 02/24/2014, 11:09 PM

## 2014-02-24 NOTE — Patient Instructions (Signed)
Your physician recommends that you schedule a follow-up appointment in: 3 months with Dr. Jordan.   No changes have been made today in your therapy. 

## 2014-02-25 DIAGNOSIS — Z794 Long term (current) use of insulin: Secondary | ICD-10-CM | POA: Diagnosis not present

## 2014-02-25 DIAGNOSIS — I509 Heart failure, unspecified: Secondary | ICD-10-CM | POA: Diagnosis present

## 2014-02-25 DIAGNOSIS — K625 Hemorrhage of anus and rectum: Secondary | ICD-10-CM

## 2014-02-25 DIAGNOSIS — I4891 Unspecified atrial fibrillation: Secondary | ICD-10-CM

## 2014-02-25 DIAGNOSIS — I252 Old myocardial infarction: Secondary | ICD-10-CM | POA: Diagnosis not present

## 2014-02-25 DIAGNOSIS — K921 Melena: Secondary | ICD-10-CM | POA: Diagnosis present

## 2014-02-25 DIAGNOSIS — K922 Gastrointestinal hemorrhage, unspecified: Secondary | ICD-10-CM

## 2014-02-25 DIAGNOSIS — I2510 Atherosclerotic heart disease of native coronary artery without angina pectoris: Secondary | ICD-10-CM | POA: Diagnosis present

## 2014-02-25 DIAGNOSIS — K219 Gastro-esophageal reflux disease without esophagitis: Secondary | ICD-10-CM | POA: Diagnosis present

## 2014-02-25 DIAGNOSIS — Z950 Presence of cardiac pacemaker: Secondary | ICD-10-CM

## 2014-02-25 DIAGNOSIS — J449 Chronic obstructive pulmonary disease, unspecified: Secondary | ICD-10-CM | POA: Diagnosis present

## 2014-02-25 DIAGNOSIS — I129 Hypertensive chronic kidney disease with stage 1 through stage 4 chronic kidney disease, or unspecified chronic kidney disease: Secondary | ICD-10-CM | POA: Diagnosis present

## 2014-02-25 DIAGNOSIS — K644 Residual hemorrhoidal skin tags: Secondary | ICD-10-CM | POA: Diagnosis present

## 2014-02-25 DIAGNOSIS — I5042 Chronic combined systolic (congestive) and diastolic (congestive) heart failure: Secondary | ICD-10-CM | POA: Diagnosis present

## 2014-02-25 DIAGNOSIS — F0390 Unspecified dementia without behavioral disturbance: Secondary | ICD-10-CM | POA: Diagnosis present

## 2014-02-25 DIAGNOSIS — I079 Rheumatic tricuspid valve disease, unspecified: Secondary | ICD-10-CM | POA: Diagnosis present

## 2014-02-25 DIAGNOSIS — Z841 Family history of disorders of kidney and ureter: Secondary | ICD-10-CM | POA: Diagnosis not present

## 2014-02-25 DIAGNOSIS — Z7901 Long term (current) use of anticoagulants: Secondary | ICD-10-CM

## 2014-02-25 DIAGNOSIS — E1142 Type 2 diabetes mellitus with diabetic polyneuropathy: Secondary | ICD-10-CM | POA: Diagnosis present

## 2014-02-25 DIAGNOSIS — D472 Monoclonal gammopathy: Secondary | ICD-10-CM | POA: Diagnosis present

## 2014-02-25 DIAGNOSIS — D649 Anemia, unspecified: Secondary | ICD-10-CM

## 2014-02-25 DIAGNOSIS — E039 Hypothyroidism, unspecified: Secondary | ICD-10-CM | POA: Diagnosis present

## 2014-02-25 DIAGNOSIS — Z800 Family history of malignant neoplasm of digestive organs: Secondary | ICD-10-CM | POA: Diagnosis not present

## 2014-02-25 DIAGNOSIS — Z951 Presence of aortocoronary bypass graft: Secondary | ICD-10-CM | POA: Diagnosis not present

## 2014-02-25 DIAGNOSIS — Z79899 Other long term (current) drug therapy: Secondary | ICD-10-CM | POA: Diagnosis not present

## 2014-02-25 DIAGNOSIS — Z888 Allergy status to other drugs, medicaments and biological substances status: Secondary | ICD-10-CM | POA: Diagnosis not present

## 2014-02-25 DIAGNOSIS — I428 Other cardiomyopathies: Secondary | ICD-10-CM | POA: Diagnosis present

## 2014-02-25 DIAGNOSIS — E663 Overweight: Secondary | ICD-10-CM | POA: Diagnosis present

## 2014-02-25 DIAGNOSIS — Z8673 Personal history of transient ischemic attack (TIA), and cerebral infarction without residual deficits: Secondary | ICD-10-CM | POA: Diagnosis not present

## 2014-02-25 DIAGNOSIS — D500 Iron deficiency anemia secondary to blood loss (chronic): Secondary | ICD-10-CM | POA: Diagnosis present

## 2014-02-25 DIAGNOSIS — Z880 Allergy status to penicillin: Secondary | ICD-10-CM | POA: Diagnosis not present

## 2014-02-25 DIAGNOSIS — N183 Chronic kidney disease, stage 3 unspecified: Secondary | ICD-10-CM | POA: Diagnosis present

## 2014-02-25 DIAGNOSIS — Z6839 Body mass index (BMI) 39.0-39.9, adult: Secondary | ICD-10-CM | POA: Diagnosis not present

## 2014-02-25 DIAGNOSIS — K5730 Diverticulosis of large intestine without perforation or abscess without bleeding: Secondary | ICD-10-CM | POA: Diagnosis present

## 2014-02-25 DIAGNOSIS — I5022 Chronic systolic (congestive) heart failure: Secondary | ICD-10-CM

## 2014-02-25 DIAGNOSIS — D631 Anemia in chronic kidney disease: Secondary | ICD-10-CM | POA: Diagnosis present

## 2014-02-25 DIAGNOSIS — Z8249 Family history of ischemic heart disease and other diseases of the circulatory system: Secondary | ICD-10-CM | POA: Diagnosis not present

## 2014-02-25 DIAGNOSIS — Z87891 Personal history of nicotine dependence: Secondary | ICD-10-CM | POA: Diagnosis not present

## 2014-02-25 DIAGNOSIS — E785 Hyperlipidemia, unspecified: Secondary | ICD-10-CM | POA: Diagnosis present

## 2014-02-25 DIAGNOSIS — I872 Venous insufficiency (chronic) (peripheral): Secondary | ICD-10-CM | POA: Diagnosis present

## 2014-02-25 DIAGNOSIS — E1149 Type 2 diabetes mellitus with other diabetic neurological complication: Secondary | ICD-10-CM | POA: Diagnosis present

## 2014-02-25 DIAGNOSIS — I359 Nonrheumatic aortic valve disorder, unspecified: Secondary | ICD-10-CM | POA: Diagnosis present

## 2014-02-25 DIAGNOSIS — D638 Anemia in other chronic diseases classified elsewhere: Secondary | ICD-10-CM | POA: Diagnosis present

## 2014-02-25 HISTORY — DX: Gastrointestinal hemorrhage, unspecified: K92.2

## 2014-02-25 LAB — CBC
HCT: 22.2 % — ABNORMAL LOW (ref 36.0–46.0)
HCT: 27 % — ABNORMAL LOW (ref 36.0–46.0)
HEMOGLOBIN: 8.6 g/dL — AB (ref 12.0–15.0)
Hemoglobin: 7.1 g/dL — ABNORMAL LOW (ref 12.0–15.0)
MCH: 28.6 pg (ref 26.0–34.0)
MCH: 28.3 pg (ref 26.0–34.0)
MCHC: 32 g/dL (ref 30.0–36.0)
MCHC: 31.9 g/dL (ref 30.0–36.0)
MCV: 89.5 fL (ref 78.0–100.0)
MCV: 88.8 fL (ref 78.0–100.0)
PLATELETS: 155 10*3/uL (ref 150–400)
Platelets: 161 10*3/uL (ref 150–400)
RBC: 2.48 MIL/uL — ABNORMAL LOW (ref 3.87–5.11)
RBC: 3.04 MIL/uL — AB (ref 3.87–5.11)
RDW: 18.8 % — AB (ref 11.5–15.5)
RDW: 16.8 % — ABNORMAL HIGH (ref 11.5–15.5)
WBC: 7.3 10*3/uL (ref 4.0–10.5)
WBC: 7.8 10*3/uL (ref 4.0–10.5)

## 2014-02-25 LAB — COMPREHENSIVE METABOLIC PANEL
ALT: 16 U/L (ref 0–35)
AST: 27 U/L (ref 0–37)
Albumin: 2.7 g/dL — ABNORMAL LOW (ref 3.5–5.2)
Alkaline Phosphatase: 103 U/L (ref 39–117)
Anion gap: 11 (ref 5–15)
BUN: 61 mg/dL — ABNORMAL HIGH (ref 6–23)
CALCIUM: 8.4 mg/dL (ref 8.4–10.5)
CO2: 27 mEq/L (ref 19–32)
CREATININE: 1.84 mg/dL — AB (ref 0.50–1.10)
Chloride: 103 mEq/L (ref 96–112)
GFR calc non Af Amer: 25 mL/min — ABNORMAL LOW (ref >90)
GFR, EST AFRICAN AMERICAN: 29 mL/min — AB (ref >90)
GLUCOSE: 187 mg/dL — AB (ref 70–99)
Potassium: 4.2 mEq/L (ref 3.7–5.3)
SODIUM: 141 meq/L (ref 137–147)
Total Bilirubin: 0.3 mg/dL (ref 0.3–1.2)
Total Protein: 6.7 g/dL (ref 6.0–8.3)

## 2014-02-25 LAB — PREPARE RBC (CROSSMATCH)

## 2014-02-25 LAB — PROTIME-INR
INR: 6.98 — AB (ref 0.00–1.49)
PROTHROMBIN TIME: 60.2 s — AB (ref 11.6–15.2)

## 2014-02-25 LAB — GLUCOSE, CAPILLARY
GLUCOSE-CAPILLARY: 181 mg/dL — AB (ref 70–99)
Glucose-Capillary: 154 mg/dL — ABNORMAL HIGH (ref 70–99)

## 2014-02-25 LAB — MRSA PCR SCREENING: MRSA BY PCR: NEGATIVE

## 2014-02-25 LAB — CBG MONITORING, ED
GLUCOSE-CAPILLARY: 183 mg/dL — AB (ref 70–99)
GLUCOSE-CAPILLARY: 267 mg/dL — AB (ref 70–99)

## 2014-02-25 MED ORDER — PHYTONADIONE 5 MG PO TABS
2.5000 mg | ORAL_TABLET | Freq: Once | ORAL | Status: AC
  Administered 2014-02-25: 2.5 mg via ORAL
  Filled 2014-02-25: qty 1

## 2014-02-25 MED ORDER — ZOLPIDEM TARTRATE 5 MG PO TABS
5.0000 mg | ORAL_TABLET | Freq: Every evening | ORAL | Status: DC | PRN
  Administered 2014-02-25: 5 mg via ORAL
  Filled 2014-02-25: qty 1

## 2014-02-25 MED ORDER — LEVOTHYROXINE SODIUM 100 MCG PO TABS
100.0000 ug | ORAL_TABLET | Freq: Every day | ORAL | Status: DC
  Administered 2014-02-25 – 2014-02-27 (×3): 100 ug via ORAL
  Filled 2014-02-25 (×4): qty 1

## 2014-02-25 MED ORDER — POTASSIUM CHLORIDE IN NACL 20-0.9 MEQ/L-% IV SOLN
INTRAVENOUS | Status: DC
  Administered 2014-02-25: 03:00:00 via INTRAVENOUS
  Filled 2014-02-25: qty 1000

## 2014-02-25 MED ORDER — PANTOPRAZOLE SODIUM 40 MG PO TBEC
40.0000 mg | DELAYED_RELEASE_TABLET | Freq: Every day | ORAL | Status: DC
  Administered 2014-02-25 – 2014-02-27 (×3): 40 mg via ORAL
  Filled 2014-02-25 (×4): qty 1

## 2014-02-25 MED ORDER — NITROGLYCERIN 0.4 MG SL SUBL: 0.4000 mg | SUBLINGUAL_TABLET | SUBLINGUAL | Status: DC | PRN

## 2014-02-25 MED ORDER — EZETIMIBE 10 MG PO TABS
10.0000 mg | ORAL_TABLET | Freq: Every evening | ORAL | Status: DC
  Administered 2014-02-25 – 2014-02-26 (×2): 10 mg via ORAL
  Filled 2014-02-25 (×3): qty 1

## 2014-02-25 MED ORDER — METOPROLOL SUCCINATE ER 50 MG PO TB24
50.0000 mg | ORAL_TABLET | Freq: Every day | ORAL | Status: DC
  Administered 2014-02-25 – 2014-02-27 (×3): 50 mg via ORAL
  Filled 2014-02-25 (×4): qty 1

## 2014-02-25 MED ORDER — INSULIN ASPART 100 UNIT/ML ~~LOC~~ SOLN
0.0000 [IU] | Freq: Three times a day (TID) | SUBCUTANEOUS | Status: DC
  Administered 2014-02-25: 8 [IU] via SUBCUTANEOUS
  Administered 2014-02-25: 3 [IU] via SUBCUTANEOUS
  Administered 2014-02-26: 8 [IU] via SUBCUTANEOUS
  Administered 2014-02-26: 3 [IU] via SUBCUTANEOUS
  Filled 2014-02-25: qty 1

## 2014-02-25 MED ORDER — DILTIAZEM HCL ER COATED BEADS 240 MG PO CP24
240.0000 mg | ORAL_CAPSULE | Freq: Every day | ORAL | Status: DC
  Administered 2014-02-25 – 2014-02-27 (×3): 240 mg via ORAL
  Filled 2014-02-25 (×4): qty 1

## 2014-02-25 MED ORDER — ATORVASTATIN CALCIUM 40 MG PO TABS
40.0000 mg | ORAL_TABLET | Freq: Every evening | ORAL | Status: DC
  Administered 2014-02-25 – 2014-02-26 (×2): 40 mg via ORAL
  Filled 2014-02-25 (×3): qty 1

## 2014-02-25 MED ORDER — INSULIN GLARGINE 100 UNIT/ML ~~LOC~~ SOLN
25.0000 [IU] | Freq: Two times a day (BID) | SUBCUTANEOUS | Status: DC
  Administered 2014-02-25 – 2014-02-27 (×6): 25 [IU] via SUBCUTANEOUS
  Filled 2014-02-25 (×7): qty 0.25

## 2014-02-25 MED ORDER — DONEPEZIL HCL 10 MG PO TABS
10.0000 mg | ORAL_TABLET | Freq: Every day | ORAL | Status: DC
  Administered 2014-02-25 – 2014-02-26 (×3): 10 mg via ORAL
  Filled 2014-02-25 (×5): qty 1

## 2014-02-25 MED ORDER — SODIUM CHLORIDE 0.9 % IV SOLN
Freq: Once | INTRAVENOUS | Status: AC
  Administered 2014-02-25: 11:00:00 via INTRAVENOUS

## 2014-02-25 MED ORDER — ACETAMINOPHEN 325 MG PO TABS: 650.0000 mg | ORAL_TABLET | Freq: Four times a day (QID) | ORAL | Status: DC | PRN

## 2014-02-25 MED ORDER — NORTRIPTYLINE HCL 25 MG PO CAPS
75.0000 mg | ORAL_CAPSULE | Freq: Every day | ORAL | Status: DC
  Administered 2014-02-25 – 2014-02-26 (×2): 75 mg via ORAL
  Filled 2014-02-25 (×3): qty 3

## 2014-02-25 MED ORDER — POTASSIUM CHLORIDE CRYS ER 20 MEQ PO TBCR
40.0000 meq | EXTENDED_RELEASE_TABLET | Freq: Two times a day (BID) | ORAL | Status: DC
  Administered 2014-02-25 – 2014-02-27 (×6): 40 meq via ORAL
  Filled 2014-02-25 (×7): qty 2

## 2014-02-25 MED ORDER — SODIUM CHLORIDE 0.9 % IJ SOLN
3.0000 mL | Freq: Two times a day (BID) | INTRAMUSCULAR | Status: DC
  Administered 2014-02-25 – 2014-02-26 (×3): 3 mL via INTRAVENOUS
  Filled 2014-02-25: qty 3

## 2014-02-25 MED ORDER — ACETAMINOPHEN 650 MG RE SUPP: 650.0000 mg | Freq: Four times a day (QID) | RECTAL | Status: DC | PRN

## 2014-02-25 MED ORDER — AMIODARONE HCL 100 MG PO TABS
100.0000 mg | ORAL_TABLET | Freq: Every day | ORAL | Status: DC
  Administered 2014-02-25 – 2014-02-27 (×3): 100 mg via ORAL
  Filled 2014-02-25 (×3): qty 1

## 2014-02-25 MED ORDER — INSULIN ASPART 100 UNIT/ML ~~LOC~~ SOLN: 0.0000 [IU] | Freq: Every day | SUBCUTANEOUS | Status: DC

## 2014-02-25 MED ORDER — FUROSEMIDE 40 MG PO TABS
40.0000 mg | ORAL_TABLET | Freq: Two times a day (BID) | ORAL | Status: DC
  Administered 2014-02-25 (×2): 40 mg via ORAL
  Filled 2014-02-25: qty 2
  Filled 2014-02-25 (×4): qty 1

## 2014-02-25 MED ORDER — SODIUM CHLORIDE 0.9 % IV SOLN
INTRAVENOUS | Status: DC
  Administered 2014-02-25: 10 mL/h via INTRAVENOUS
  Administered 2014-02-26: 18:00:00 via INTRAVENOUS

## 2014-02-25 MED ORDER — FUROSEMIDE 20 MG PO TABS: 40.0000 mg | ORAL_TABLET | Freq: Two times a day (BID) | ORAL | Status: DC

## 2014-02-25 MED ORDER — AMITRIPTYLINE HCL 25 MG PO TABS
75.0000 mg | ORAL_TABLET | Freq: Every day | ORAL | Status: DC
  Administered 2014-02-25: 75 mg via ORAL
  Filled 2014-02-25: qty 3

## 2014-02-25 NOTE — ED Notes (Signed)
Dr. Russo at bedside.

## 2014-02-25 NOTE — ED Notes (Signed)
Per Dr. Dagmar Hait, Hold morning coverage of insulin.

## 2014-02-25 NOTE — Progress Notes (Signed)
UR completed 

## 2014-02-25 NOTE — Progress Notes (Signed)
Pt came to floor from ED with 2nd unit of blood infusing. Pt is alert and oriented x 4. Pt states no pain. Pt had  CHG bath. Pt's vitals within normal limits. Will monitor. Central telemetry notified.

## 2014-02-25 NOTE — ED Notes (Signed)
Attempted Report 

## 2014-02-25 NOTE — ED Notes (Signed)
IV RN at bedside 

## 2014-02-25 NOTE — ED Notes (Signed)
Pt assisted with getting up from the bed and using the bedside commode.  Pt was not walked to the bathroom due to unsteadiness of her on her feet.  Pt follows commands.

## 2014-02-25 NOTE — Progress Notes (Signed)
Subjective: Admitted c LGIB secondary to Supratherapeutic INR. 8/5 INR 3.7 = No warfarin today Wednesday August 5, then take 2 tablets daily except 1.5 tablets each Sunday, Tuesday and Thursday. Repeat INR in 2 weeks 8/24 INR 8 S/P Vit K. Recent Defication she had SOB and blood/Melena. Some Tachycardia. This am she feels better  Objective: Vital signs in last 24 hours: Temp:  [97.9 F (36.6 C)-98.5 F (36.9 C)] 97.9 F (36.6 C) (08/25 0255) Pulse Rate:  [70-76] 71 (08/25 0659) Resp:  [13-25] 19 (08/25 0659) BP: (117-150)/(40-66) 134/52 mmHg (08/25 0659) SpO2:  [96 %-99 %] 96 % (08/25 0659) Weight:  [101.56 kg (223 lb 14.4 oz)] 101.56 kg (223 lb 14.4 oz) (08/24 1457) Weight change:     CBG (last 3)  No results found for this basename: GLUCAP,  in the last 72 hours  Intake/Output from previous day:  Intake/Output Summary (Last 24 hours) at 02/25/14 0716 Last data filed at 02/25/14 0451  Gross per 24 hour  Intake      0 ml  Output    200 ml  Net   -200 ml   08/24 0701 - 08/25 0700 In: -  Out: 200 [Urine:200]   Physical Exam  General appearance: A anO. Eyes: no scleral icterus Throat: oropharynx moist without erythema Resp: Distant but clear Cardio: Rag/Sternotomy/m/Pacer GI: soft, non-tender; bowel sounds normal; no masses,  no organomegaly Extremities: min edema for her   Lab Results:  Recent Labs  02/24/14 2136 02/25/14 0614  NA 141 141  K 3.6* 4.2  CL 101 103  CO2 25 27  GLUCOSE 225* 187*  BUN 58* 61*  CREATININE 1.82* 1.84*  CALCIUM 8.3* 8.4     Recent Labs  02/25/14 0614  AST 27  ALT 16  ALKPHOS 103  BILITOT 0.3  PROT 6.7  ALBUMIN 2.7*     Recent Labs  02/24/14 2136 02/25/14 0614  WBC 7.6 7.3  NEUTROABS 4.5  --   HGB 7.2* 7.1*  HCT 22.8* 22.2*  MCV 87.7 89.5  PLT 160 161    Lab Results  Component Value Date   INR 6.98* 02/25/2014   INR 7.71* 02/24/2014   INR 8 02/24/2014    No results found for this basename: CKTOTAL,  CKMB, CKMBINDEX, TROPONINI,  in the last 72 hours  No results found for this basename: TSH, T4TOTAL, FREET3, T3FREE, THYROIDAB,  in the last 72 hours  No results found for this basename: VITAMINB12, FOLATE, FERRITIN, TIBC, IRON, RETICCTPCT,  in the last 72 hours  Micro Results: No results found for this or any previous visit (from the past 240 hour(s)).   Studies/Results: No results found.   Medications: Scheduled: . amiodarone  100 mg Oral Daily  . atorvastatin  40 mg Oral QPM  . diltiazem  240 mg Oral Daily  . donepezil  10 mg Oral QHS  . ezetimibe  10 mg Oral QPM  . furosemide  40 mg Oral BID  . insulin aspart  0-15 Units Subcutaneous TID WC  . insulin aspart  0-5 Units Subcutaneous QHS  . insulin glargine  25 Units Subcutaneous BID  . levothyroxine  100 mcg Oral q morning - 10a  . metoprolol succinate  50 mg Oral Daily  . nortriptyline  75 mg Oral QHS  . pantoprazole  40 mg Oral Daily  . phytonadione  2.5 mg Oral Once  . potassium chloride SA  40 mEq Oral BID  . sodium chloride  3 mL Intravenous Q12H  Continuous: . sodium chloride    . 0.9 % NaCl with KCl 20 mEq / L 10 mL/hr at 02/25/14 0248     Assessment/Plan: Active Problems:   Lower GI bleed  LGIB secondary to supratherapeutic INR.--presumably secondary to oozing and bleeding hemorrhoids in conjunction with a history of diverticulosis, currently hemodynamically stable.  Going to Step down.   On 8/5 INR 3.7.  No warfarin today Wednesday August 5, then take 2 tablets daily except 1.5 tablets each Sunday, Tuesday and Thursday. Repeat INR in 2 weeks INR 8 yesterday.  This am 6.98.  Coumadin reversed and held. Dr Benson Norway is her GI Doc.  He has scoped her 01/2013 = Diverticulosis and gastric/duodenalpolyps.  Give 2.5 more K.  Also ? Need for future coumadin??  Sxatic Anemia Transfuse. On Aranesp as outpt.  Dr Juliann Mule  CHF - stable.  CAD/CABG/CM/Pacer  AFib - currently regular  DM2 --112-225 - continue insulin- No  results found for this basename: GLUCAP,  in the last 72 hours  DVT Prophylaxis - squeezers  CKD 3 - Cr 1.82 - 2.2 and stable/fine for her    LOS: 1 day   [name] M 02/25/2014, 7:16 AM

## 2014-02-25 NOTE — ED Notes (Signed)
IV Team paged 

## 2014-02-25 NOTE — ED Notes (Signed)
Clear Liq tray ordered.

## 2014-02-25 NOTE — Consult Note (Signed)
Admit date: 02/24/2014 Referring Physician  Dr. Virgina Jock Primary Physician [name], MD Primary Cardiologist  Dr. Martinique Reason for Consultation  question about anticoagulation in the setting of  GI bleed.  HPI: 80 year-old female with coronary artery disease status post bypass, diabetes, hypertension, prior stroke, chronic systolic heart failure with ejection fraction in the 40% range, anemia of chronic disease, short-term memory loss here with hematochezia in the setting of supratherapeutic INR.  She has received vitamin K. Dr. Benson Norway is her gastroenterologist.  We are being consulted to discuss whether or not we should discontinue chronic anticoagulation.  She was seen yesterday on 02/24/14 as office visit. This is where her supratherapeutic INR was discovered and she was asked to proceed to the emergency department secondary to her hematochezia.    PMH:   Past Medical History  Diagnosis Date  . Coronary artery disease     a. Severe two vessel; s/p CABG (LIMA-LAD, SVG-diag, SVG-PDA);  b. 05/2012 NSTEMI in setting of rapid aflutter;  c. 06/2012 Lexiscan cardiolite: small, mild reversible defect in lateral wall->mild ischemia, low-risk->med Rx.  Marland Kitchen Hypertension   . Type II diabetes mellitus   . Hyperlipidemia   . Diabetic neuropathy   . CVA (cerebral vascular accident) 2004  . Hypothyroidism   . Chronic anemia   . GERD (gastroesophageal reflux disease)   . Arthritis   . Memory loss, short term   . COPD (chronic obstructive pulmonary disease)   . Blood transfusion without reported diagnosis   . Chronic combined systolic and diastolic CHF (congestive heart failure)     a. 07/2012 Echo: EF 30%, Gr II DD, Mild AS/MR, Mod-Sev TR, mild bi-atrial and RV dil, PASP 4mmHg.  . Moderate mitral regurgitation     a. mod by TEE 2012, mild by echo 2013, 07/2012  . Severe tricuspid regurgitation     a. Severe by TEE 2012, mod by echo 2013, mod-sev by echo 07/2012  . Chronic vulvitis 07/1993  .  Idiopathic anemia 2006  . Atrial fibrillation     a. Failed amiodarone (prolonged QT), not a candidate for class IC meds due to her CAD. No Multaq due to CHF hx;  b. 07/2012 s/p SJM Anthem RF Bi-V PPM, ser # [id number] and AV node RFCA;  c. chronic coumadin.  . Atrial flutter   . Pacemaker   . GI bleed     a. 10/2011 - Dr Benson Norway did scope which showed hiatal hernia, colon revealed diverticula (diverticular bleed), internal/ext hemorrhoids.    PSH:   Past Surgical History  Procedure Laterality Date  . Coronary artery bypass graft  06/2009    X3, LIMA to LAD, SVG to diagonal, SVG to the posterior descending artery.  . Tonsillectomy    . Knee arthroscopy  08/2001  . Transthoracic echocardiogram  11/12/2010     Ejection fraction felt to be around 50%.  Wall thickness was increased in a pattern of mild LVH.  Moderately dilated left atrium.  Mildly dilated right atrium. Right ventricle was mildly dilated with mildly reduced systolic function  . Cardiac catheterization  06/09/2009    inferior wall hypokinesia with ejection fraction of 55%.  . Abdominal hysterectomy    . Joint replacement    . Tee without cardioversion  05/23/2011    Procedure: TRANSESOPHAGEAL ECHOCARDIOGRAM (TEE);  Surgeon: Lelon Perla, MD;  Location: [facility];  Service: Cardiovascular;  Laterality: N/A;  . Incision and drainage abscess  05/07/2012    Procedure: INCISION AND DRAINAGE  ABSCESS;  Surgeon: Adin Hector, MD;  Location: [facility];  Service: General;  Laterality: N/A;  Groin wound  . Total abdominal hysterectomy  1979  . Breast biopsy  Left  . Bilateral salpingectomy  10/2000  . Breast biopsy  09/18/2009    fibrocystic change  . Coronary artery bypass graft  06/2009  . Pacemaker insertion  08/2012  . Colonoscopy N/A 01/19/2013    Procedure: COLONOSCOPY;  Surgeon: Inda Castle, MD;  Location: [facility];  Service: Endoscopy;  Laterality: N/A;  . Esophagogastroduodenoscopy N/A 01/19/2013    Procedure:  ESOPHAGOGASTRODUODENOSCOPY (EGD);  Surgeon: Inda Castle, MD;  Location: [facility];  Service: Endoscopy;  Laterality: N/A;   Allergies:  Penicillins; Betadine; Capoten; Codone; Methadone; and Propoxyphene and methadone Prior to Admit Meds:   Prior to Admission medications   Medication Sig Start Date End Date Taking? Authorizing Provider  amiodarone (PACERONE) 200 MG tablet Take 100 mg by mouth daily.   Yes Historical Provider, MD  amitriptyline (ELAVIL) 25 MG tablet Take 75 mg by mouth at bedtime.     Yes Historical Provider, MD  atorvastatin (LIPITOR) 40 MG tablet Take 40 mg by mouth every evening.    Yes Historical Provider, MD  diltiazem (CARDIZEM CD) 240 MG 24 hr capsule Take 240 mg by mouth daily.   Yes Historical Provider, MD  donepezil (ARICEPT) 10 MG tablet Take 10 mg by mouth at bedtime.   Yes Historical Provider, MD  esomeprazole (NEXIUM) 40 MG capsule Take 40 mg by mouth daily before breakfast.     Yes Historical Provider, MD  ezetimibe (ZETIA) 10 MG tablet Take 10 mg by mouth every evening.    Yes Historical Provider, MD  furosemide (LASIX) 20 MG tablet Take 40-60 mg by mouth 2 (two) times daily. 60 mg in the am and 40 mg in the pm 01/22/14  Yes Eileen Stanford, PA-C  insulin glargine (LANTUS) 100 UNIT/ML injection Inject 50 Units into the skin 2 (two) times daily.    Yes Historical Provider, MD  insulin lispro (HUMALOG) 100 UNIT/ML injection Inject 20-25 Units into the skin 2 (two) times daily as needed for high blood sugar. Home sliding scale If blood sugar is <200 take 20 units If blood sugar is >200 take 25 units If blood sugar is less than 70, don't take any   Yes Historical Provider, MD  levothyroxine (SYNTHROID, LEVOTHROID) 100 MCG tablet Take 100 mcg by mouth every morning.    Yes Historical Provider, MD  metoprolol succinate (TOPROL-XL) 50 MG 24 hr tablet Take 50 mg by mouth daily. Take with or immediately following a meal.   Yes Historical Provider, MD  Multiple  Vitamins-Minerals (ICAPS AREDS FORMULA PO) Take 1 tablet by mouth 2 (two) times daily.   Yes Historical Provider, MD  nitroGLYCERIN (NITROSTAT) 0.4 MG SL tablet Place 0.4 mg under the tongue every 5 (five) minutes as needed. For chest pain   Yes Historical Provider, MD  potassium chloride SA (K-DUR,KLOR-CON) 20 MEQ tablet Take 2 tablets (40 mEq total) by mouth 2 (two) times daily. 01/22/14  Yes Eileen Stanford, PA-C  warfarin (COUMADIN) 5 MG tablet Take 7.5-10 mg by mouth daily. Take 10mg  by mouth on Sunday, Monday, Wednesday, Friday and Saturday. Take 7.5mg  by mouth on Tuesday and Thursday.   Yes Historical Provider, MD   Fam HX:    Family History  Problem Relation Age of Onset  . Colon cancer Mother   . Stroke Mother   .  Hypertension Mother   . Cancer Mother     breast  . Cancer Father     colon  . Heart attack Father     x2  . Renal Disease Sister     dialysis-twin sister  . Renal Disease Sister     renal bypass   Social HX:    History   Social History  . Marital Status: Married    Spouse Name: N/A    Number of Children: 2  . Years of Education: N/A   Occupational History  . teacher    Social History Main Topics  . Smoking status: Former Smoker    Types: Cigarettes  . Smokeless tobacco: Never Used  . Alcohol Use: No  . Drug Use: No  . Sexual Activity: No     Comment: hysterectomy   Other Topics Concern  . Not on file   Social History Narrative   Lives in [place] with spouse     ROS: Denies any recent fevers, no syncope, no chest pain. She has had prior shortness of breath, hematochezia. All 11 ROS were addressed and are negative except what is stated in the HPI   Physical Exam: Blood pressure 134/52, pulse 69, temperature 97.9 F (36.6 C), temperature source Oral, resp. rate 20, SpO2 96.00%.   General: Well developed, moderately overweight, in no acute distress Head: Eyes PERRLA, No xanthomas.   Normal cephalic and atramatic  Lungs:   Clear  bilaterally to auscultation and percussion. Normal respiratory effort. No wheezes, no rales. Heart:   HRRR S1 S2 Pulses are 2+ & equal. 3/6 systolic right upper sternal border murmur, rubs, gallops.  No carotid bruit. No JVD.  No abdominal bruits.  Abdomen: Bowel sounds are positive, abdomen soft and non-tender without masses. No hepatosplenomegaly. Obese Msk:  Back normal. Normal strength and tone for age. Extremities:  No clubbing, cyanosis.  No pitting edema. Chronic skin changes from chronic edema. DP +1 Neuro: Alert and oriented X 3, non-focal, MAE x 4 GU: Deferred Rectal: Deferred Psych:  Good affect, responds appropriately      Labs: Lab Results  Component Value Date   WBC 7.3 02/25/2014   HGB 7.1* 02/25/2014   HCT 22.2* 02/25/2014   MCV 89.5 02/25/2014   PLT 161 02/25/2014     Recent Labs Lab 02/25/14 0614  NA 141  K 4.2  CL 103  CO2 27  BUN 61*  CREATININE 1.84*  CALCIUM 8.4  PROT 6.7  BILITOT 0.3  ALKPHOS 103  ALT 16  AST 27  GLUCOSE 187*   No results found for this basename: CKTOTAL, CKMB, TROPONINI,  in the last 72 hours Lab Results  Component Value Date   CHOL 160 05/26/2012   HDL 29* 05/26/2012   LDLCALC 115* 05/26/2012   TRIG 80 05/26/2012   EKG:  Ventricular paced on 01/16/14 Personally viewed.   ASSESSMENT/PLAN:    80 year old female with coronary artery disease status post bypass surgery, chronic systolic heart failure, prior stroke in past medical history, diabetic, hypertensive with chronic anticoagulation secondary to atrial fibrillation now with lower GI bleed in the setting of supratherapeutic INR.  1. Chronic anticoagulation- CHADS-VASc score is 8  (1-female, 1-HTN, 1-DM, 2-Age, 1-HF, 2-CVA) which places her at a 10.8% risk of stroke per year. Based upon her extremely high risk for stroke, I would advocate for continued use of anticoagulation with close and careful monitoring once her acute bleeding has resolved. The patient states that she has  not had  any further rectal bleeding. Obviously, I understand the challenges in making this decision, weighing risks of bleeding versus benefit of stroke prevention. I will forward note to Dr. Martinique.   2. Chronic systolic heart failure-much improved compared to 2 weeks ago. Continuing current regimen of Lasix 60 mg in the morning and 40 mg in the evening. Low-sodium diet.  3. Atrial fibrillation - currently controlled. Pacemaker present to avoid bradycardia.  4. Coronary artery disease-CABG (LIMA-LAD, SVG-diag and SVG-PDA). Her last nuclear stress test was 06/08/2012 and was low risk.  5. Aortic stenosis-mild. Murmur heard. Should be of no clinical significance.   Candee Furbish, MD  02/25/2014  9:41 AM

## 2014-02-25 NOTE — ED Notes (Signed)
Paging Admitting Dr. Dagmar Hait.

## 2014-02-25 NOTE — ED Notes (Signed)
Contacted Pharmacy about meds

## 2014-02-25 NOTE — ED Notes (Signed)
Meal tray at bedside,

## 2014-02-25 NOTE — ED Notes (Signed)
Critical Labs of PT 60.2 INR 6.98 per Santiago Glad in the Lab.

## 2014-02-26 ENCOUNTER — Encounter (HOSPITAL_COMMUNITY): Payer: Self-pay | Admitting: General Practice

## 2014-02-26 DIAGNOSIS — IMO0002 Reserved for concepts with insufficient information to code with codable children: Secondary | ICD-10-CM

## 2014-02-26 LAB — CBC
HCT: 25 % — ABNORMAL LOW (ref 36.0–46.0)
Hemoglobin: 8 g/dL — ABNORMAL LOW (ref 12.0–15.0)
MCH: 27.9 pg (ref 26.0–34.0)
MCHC: 32 g/dL (ref 30.0–36.0)
MCV: 87.1 fL (ref 78.0–100.0)
Platelets: 145 10*3/uL — ABNORMAL LOW (ref 150–400)
RBC: 2.87 MIL/uL — AB (ref 3.87–5.11)
RDW: 16.9 % — AB (ref 11.5–15.5)
WBC: 8 10*3/uL (ref 4.0–10.5)

## 2014-02-26 LAB — BASIC METABOLIC PANEL
Anion gap: 11 (ref 5–15)
BUN: 51 mg/dL — ABNORMAL HIGH (ref 6–23)
CHLORIDE: 105 meq/L (ref 96–112)
CO2: 27 meq/L (ref 19–32)
CREATININE: 1.75 mg/dL — AB (ref 0.50–1.10)
Calcium: 8.2 mg/dL — ABNORMAL LOW (ref 8.4–10.5)
GFR calc Af Amer: 31 mL/min — ABNORMAL LOW (ref >90)
GFR calc non Af Amer: 27 mL/min — ABNORMAL LOW (ref >90)
Glucose, Bld: 98 mg/dL (ref 70–99)
POTASSIUM: 3.8 meq/L (ref 3.7–5.3)
SODIUM: 143 meq/L (ref 137–147)

## 2014-02-26 LAB — PROTIME-INR
INR: 4.04 — AB (ref 0.00–1.49)
INR: 3.06 — ABNORMAL HIGH (ref 0.00–1.49)
PROTHROMBIN TIME: 39.3 s — AB (ref 11.6–15.2)
Prothrombin Time: 31.6 seconds — ABNORMAL HIGH (ref 11.6–15.2)

## 2014-02-26 LAB — PREPARE RBC (CROSSMATCH)

## 2014-02-26 LAB — GLUCOSE, CAPILLARY
GLUCOSE-CAPILLARY: 111 mg/dL — AB (ref 70–99)
GLUCOSE-CAPILLARY: 274 mg/dL — AB (ref 70–99)
Glucose-Capillary: 161 mg/dL — ABNORMAL HIGH (ref 70–99)
Glucose-Capillary: 153 mg/dL — ABNORMAL HIGH (ref 70–99)

## 2014-02-26 LAB — HEMOGLOBIN AND HEMATOCRIT, BLOOD
HEMATOCRIT: 33.9 % — AB (ref 36.0–46.0)
Hemoglobin: 11.2 g/dL — ABNORMAL LOW (ref 12.0–15.0)

## 2014-02-26 MED ORDER — FUROSEMIDE 40 MG PO TABS
60.0000 mg | ORAL_TABLET | Freq: Every day | ORAL | Status: DC
  Administered 2014-02-26 – 2014-02-27 (×2): 60 mg via ORAL
  Filled 2014-02-26 (×3): qty 1

## 2014-02-26 MED ORDER — ACETAMINOPHEN 325 MG PO TABS
650.0000 mg | ORAL_TABLET | Freq: Once | ORAL | Status: AC
  Administered 2014-02-26: 650 mg via ORAL
  Filled 2014-02-26: qty 2

## 2014-02-26 MED ORDER — SODIUM CHLORIDE 0.9 % IV SOLN
Freq: Once | INTRAVENOUS | Status: AC
  Administered 2014-02-26: 06:00:00 via INTRAVENOUS

## 2014-02-26 MED ORDER — FUROSEMIDE 10 MG/ML IJ SOLN
20.0000 mg | Freq: Once | INTRAMUSCULAR | Status: AC
  Administered 2014-02-26: 20 mg via INTRAVENOUS

## 2014-02-26 MED ORDER — FUROSEMIDE 40 MG PO TABS
40.0000 mg | ORAL_TABLET | Freq: Every day | ORAL | Status: DC
  Administered 2014-02-26: 40 mg via ORAL
  Filled 2014-02-26 (×2): qty 1

## 2014-02-26 MED ORDER — FUROSEMIDE 10 MG/ML IJ SOLN
INTRAMUSCULAR | Status: AC
  Filled 2014-02-26: qty 4

## 2014-02-26 NOTE — Clinical Documentation Improvement (Signed)
Patient with melana; acute renal insufficiency is documented along with hypertension and CKD3.  For the current admission and only this admission:   Black female  Creatinine has been running from 1.82 to 1.75  GFR is ranging from 29 to 31   Please clarify the stage of CKD.    Marland Kitchen Document the stage of CKD --Chronic kidney disease, stage 1 --Chronic kidney disease, stage 2 (mild) --Chronic kidney disease, stage 3 (moderate) --Chronic kidney disease, stage 4 (severe) --Chronic kidney disease, stage 5 --End-stage renal disease (ESRD)  Thank You,  Norm Parcel BSN RN Clinical Documentation Specialist [phone]; cell [phone]

## 2014-02-26 NOTE — Progress Notes (Signed)
Patient Name: [name] Date of Encounter: 02/26/2014  Principal Problem:   Lower GI bleed Active Problems:   Atrial fibrillation with RVR   Over-anticoagulated   Chronic anticoagulation   CKD (chronic kidney disease) stage 3, GFR 30-59 ml/min   Chronic systolic CHF (congestive heart failure)    Patient Profile: 80 yo female w/ hx CABG, DM, HTN, CVA, S-CHF, EF 40%, anemia, short-term memory loss, was admitted 08/25 w/ LGIB in setting of INR 8.  SUBJECTIVE: No chest pain or palpitations overnight. DOE is present but greatly improved from yesterday according to patient. Denies dizziness or nasuea.  Having fewer bowel movements with less blood. States that overall she is feeling   OBJECTIVE Filed Vitals:   02/26/14 0430 02/26/14 0619 02/26/14 0815 02/26/14 0838  BP:   116/45 107/43  Pulse:   70 76  Temp: 98.2 F (36.8 C)  98.7 F (37.1 C) 97.6 F (36.4 C)  TempSrc: Oral   Oral  Resp:   19 16  Height:   5' 2.99" (1.6 m)   Weight: 222 lb 10.6 oz (101 kg) 222 lb 7.1 oz (100.9 kg)    SpO2:   99% 100%    Intake/Output Summary (Last 24 hours) at 02/26/14 1045 Last data filed at 02/26/14 1000  Gross per 24 hour  Intake   3330 ml  Output    925 ml  Net   2405 ml   Filed Weights   02/25/14 1618 02/26/14 0430 02/26/14 0619  Weight: 222 lb 14.2 oz (101.1 kg) 222 lb 10.6 oz (101 kg) 222 lb 7.1 oz (100.9 kg)    PHYSICAL EXAM General: Well developed,  obese  female in no acute distress. Head: Normocephalic, atraumatic.  Neck: Supple without bruits, JVD not elevated. Lungs:  Resp regular and unlabored, CTA throughout builaterally. Heart: RRR, S1, S2, no S3, S4, 2/6 systolic murmur; no rub. Abdomen: Soft, non-tender, non-distended, BS + x 4.  Extremities: No clubbing, cyanosis, 1+ non pitting edema at the ankles. Chronic skin changes in lower extremities.   Neuro: Alert and oriented X 3. Moves all extremities spontaneously. Psych: Normal  affect.  LABS: CBC: Recent Labs  02/24/14 2136  02/25/14 2000 02/26/14 0340  WBC 7.6  < > 7.8 8.0  NEUTROABS 4.5  --   --   --   HGB 7.2*  < > 8.6* 8.0*  HCT 22.8*  < > 27.0* 25.0*  MCV 87.7  < > 88.8 87.1  PLT 160  < > 155 145*  < > = values in this interval not displayed. INR:  Recent Labs  02/26/14 0340  INR 7.74*   Basic Metabolic Panel:  Recent Labs  02/25/14 0614 02/26/14 0340  NA 141 143  K 4.2 3.8  CL 103 105  CO2 27 27  GLUCOSE 187* 98  BUN 61* 51*  CREATININE 1.84* 1.75*  CALCIUM 8.4 8.2*   Liver Function Tests:  Recent Labs  02/25/14 0614  AST 27  ALT 16  ALKPHOS 103  BILITOT 0.3  PROT 6.7  ALBUMIN 2.7*   TELE:  AV paced with rare PVCs.       Current Medications:  . amiodarone  100 mg Oral Daily  . atorvastatin  40 mg Oral QPM  . diltiazem  240 mg Oral Daily  . donepezil  10 mg Oral QHS  . ezetimibe  10 mg Oral QPM  . furosemide  40 mg Oral Daily  . furosemide  60 mg  Oral Daily  . insulin aspart  0-15 Units Subcutaneous TID WC  . insulin aspart  0-5 Units Subcutaneous QHS  . insulin glargine  25 Units Subcutaneous BID  . levothyroxine  100 mcg Oral QAC breakfast  . metoprolol succinate  50 mg Oral Daily  . nortriptyline  75 mg Oral QHS  . pantoprazole  40 mg Oral Daily  . potassium chloride SA  40 mEq Oral BID  . sodium chloride  3 mL Intravenous Q12H   . sodium chloride 10 mL/hr (02/25/14 1817)    ASSESSMENT AND PLAN: Principal Problem:   Lower GI bleed- per IM Active Problems:   Atrial fibrillation with RVR- currently AV pacing. Continue current treatment. Pacemaker present to control bradycardia.    Over-anticoagulated- Per IM. S/P vitamin K x2 and pRBCs x2. Hemoglobin is 8 this AM. Recieving pRBC, per IM. INR is trending down after receiving PO 5.0 mg vitamin K 8/24 and 2.5 mg vitamin K on 8/25. Recheck INR per IM.     Chronic anticoagulation- CHAD-VASc score is 8. Should continue/resume anticoagulation with close  monitoring per MD. Patient denies compliance issues with Coumadin and has had no dietary changes or other acute illness recently.     CKD (chronic kidney disease) stage 3, GFR 30-59 ml/min- per IM    Chronic systolic CHF (congestive heart failure)- improved from 2 weeks ago per Dr. Marlou Porch note. Continue Lasix 60 mg in AM and 40 mg in the PM. PTA having DOE, possible due to anemia. Follow volume status and weights closely.    Signed, Lucy Antigua, PA-S2  See and agree with changes made.  Rosaria Ferries , PA-C 10:45 AM 02/26/2014  Patient seen, examined. Available data reviewed. Agree with findings, assessment, and plan as outlined by Rosaria Ferries, PA-C. Exam reveals and elderly, pleasant woman in NAD. Lungs CTA, heart RRR with 2/6 SEM at the [place], ext with 1+ ankle edema bilaterally.   Chart reviewed. Pt with recurrent subacute GI bleeding and anemia in setting of supratherapeutic INR. Note CHADS-Vasc = 8. Need to hold coumadin at least a few weeks in my opinion. Would like to see HgB back to baseline at follow-up before restarting anticoagulation. Pt currently AV-paced. Will interrogate device to evaluate AF burden as this may impact anticoag decisions.  Sherren Mocha, M.D. 02/26/2014 12:58 PM

## 2014-02-26 NOTE — Progress Notes (Signed)
Called report to [place], [employer].  pts family at  Bedside during transfer. Pt has no complaints of pain. Vs wnl.

## 2014-02-26 NOTE — Progress Notes (Signed)
Subjective: Admitted c LGIB secondary to Supratherapeutic INR. INR's 3.7 - 8 - 6.98 - 4 S/P Vit K x 2 . S/P 2 Units PRBC and Hbg 7.2 and settled out at 8 This am she feels better SOB is better Melena and bleeding is better On Clears. Sugars are OK. Appreciate Cards input  Objective: Vital signs in last 24 hours: Temp:  [97.4 F (36.3 C)-98.7 F (37.1 C)] 98.2 F (36.8 C) (08/26 0430) Pulse Rate:  [69-142] 69 (08/26 0400) Resp:  [13-24] 17 (08/26 0400) BP: (102-143)/(44-81) 108/44 mmHg (08/26 0400) SpO2:  [95 %-100 %] 96 % (08/26 0400) Weight:  [101 kg (222 lb 10.6 oz)-101.1 kg (222 lb 14.2 oz)] 101 kg (222 lb 10.6 oz) (08/26 0430) Weight change:  Last BM Date: 02/25/14  CBG (last 3)   Recent Labs  02/25/14 1209 02/25/14 1808 02/25/14 2132  GLUCAP 267* 181* 154*    Intake/Output from previous day:  Intake/Output Summary (Last 24 hours) at 02/26/14 0558 Last data filed at 02/26/14 0500  Gross per 24 hour  Intake   2800 ml  Output    975 ml  Net   1825 ml   08/25 0701 - 08/26 0700 In: 2800 [P.O.:240; I.V.:260; Blood:2300] Out: 975 [Urine:975]   Physical Exam  General appearance: A anO. Eyes: no scleral icterus Throat: oropharynx moist without erythema Resp: Distant but clear Cardio: Rag/Sternotomy/m/Pacer.  Paced on Tele GI: soft, non-tender; bowel sounds normal; no masses,  no organomegaly Extremities: min edema for her   Lab Results:  Recent Labs  02/25/14 0614 02/26/14 0340  NA 141 143  K 4.2 3.8  CL 103 105  CO2 27 27  GLUCOSE 187* 98  BUN 61* 51*  CREATININE 1.84* 1.75*  CALCIUM 8.4 8.2*     Recent Labs  02/25/14 0614  AST 27  ALT 16  ALKPHOS 103  BILITOT 0.3  PROT 6.7  ALBUMIN 2.7*     Recent Labs  02/24/14 2136  02/25/14 2000 02/26/14 0340  WBC 7.6  < > 7.8 8.0  NEUTROABS 4.5  --   --   --   HGB 7.2*  < > 8.6* 8.0*  HCT 22.8*  < > 27.0* 25.0*  MCV 87.7  < > 88.8 87.1  PLT 160  < > 155 145*  < > = values in this  interval not displayed.  Lab Results  Component Value Date   INR 4.04* 02/26/2014   INR 6.98* 02/25/2014   INR 7.71* 02/24/2014    No results found for this basename: CKTOTAL, CKMB, CKMBINDEX, TROPONINI,  in the last 72 hours  No results found for this basename: TSH, T4TOTAL, FREET3, T3FREE, THYROIDAB,  in the last 72 hours  No results found for this basename: VITAMINB12, FOLATE, FERRITIN, TIBC, IRON, RETICCTPCT,  in the last 72 hours  Micro Results: Recent Results (from the past 240 hour(s))  MRSA PCR SCREENING     Status: None   Collection Time    02/25/14  5:05 PM      Result Value Ref Range Status   MRSA by PCR NEGATIVE  NEGATIVE Final   Comment:            The GeneXpert MRSA Assay (FDA     approved for NASAL specimens     only), is one component of a     comprehensive MRSA colonization     surveillance program. It is not     intended to diagnose MRSA  infection nor to guide or     monitor treatment for     MRSA infections.     Studies/Results: No results found.   Medications: Scheduled: . amiodarone  100 mg Oral Daily  . atorvastatin  40 mg Oral QPM  . diltiazem  240 mg Oral Daily  . donepezil  10 mg Oral QHS  . ezetimibe  10 mg Oral QPM  . furosemide  40 mg Oral BID  . insulin aspart  0-15 Units Subcutaneous TID WC  . insulin aspart  0-5 Units Subcutaneous QHS  . insulin glargine  25 Units Subcutaneous BID  . levothyroxine  100 mcg Oral QAC breakfast  . metoprolol succinate  50 mg Oral Daily  . nortriptyline  75 mg Oral QHS  . pantoprazole  40 mg Oral Daily  . potassium chloride SA  40 mEq Oral BID  . sodium chloride  3 mL Intravenous Q12H   Continuous: . sodium chloride 10 mL/hr (02/25/14 1817)     Assessment/Plan: Active Problems:   Lower GI bleed  LGIB secondary to supratherapeutic INR.--presumably secondary to oozing and bleeding hemorrhoids in conjunction with a history of diverticulosis, currently hemodynamically stable.  In Step down.  No  reason to get GI involved at this time.  INR's trending down.  S/P 2 Units Vit K.  Coumadin reversed and held. Dr Benson Norway is her GI Doc.  He has scoped her 01/2013 = Diverticulosis and gastric/duodenalpolyps.  Will hold coumadin for @ 1-2 weeks, let Hbg stabilize and b/c CHADS-VASc score is 8 (1-female, 1-HTN, 1-DM, 2-Age, 1-HF, 2-CVA) which places her at a 10.8% risk of stroke per year cards advocates for continued use of anticoagulation with close and careful monitoring once her acute bleeding has resolved.   Sxatic Anemia - S/p 2 Unit PRBC with hbg only 8 at this time - give 2 more units. On Aranesp as outpt.  Dr Juliann Mule  CHF - stable. Increase Lasix (Lasix 60 mg in the morning and 40 mg in the evening) c this upcoming transfusion  CAD/CABG/CM/Pacer/Mild AS  AFib - currently regular  DM2 - - continue insulin- Sugars fine.  Mild elevation c the clears.  Advance diet later today   Recent Labs  02/25/14 1209 02/25/14 1808 02/25/14 2132  GLUCAP 267* 181* 154*    DVT Prophylaxis - squeezers  CKD 3 - Cr 1.75 - 2.2 and stable/fine for her  Mild Dementia - No issues.  OK to walk - transfer out to tele.  She is Paced on monitor.  No Edema.  No SOB.  No active bleeding.  She has been stable.  Will check H and H at 4 pm with another INR.  Hold FFP - not needed.  Maybe feed solids for dinner.  Possible D/c tomorrow/friday    LOS: 2 days   [name] M 02/26/2014, 5:58 AM

## 2014-02-26 NOTE — Progress Notes (Signed)
[facility]  Patient Status: Active (receiving services up to time of hospitalization)  [facility] is providing the following services: RN and PT  If patient discharges after hours, please call [phone].   [name] 02/26/2014, 10:32 AM

## 2014-02-27 LAB — TYPE AND SCREEN
ABO/RH(D): A POS
Antibody Screen: NEGATIVE
DONOR AG TYPE: NEGATIVE
DONOR AG TYPE: NEGATIVE
Donor AG Type: NEGATIVE
Donor AG Type: NEGATIVE
UNIT DIVISION: 0
Unit division: 0
Unit division: 0
Unit division: 0
Unit division: 0

## 2014-02-27 LAB — CBC
HCT: 35.9 % — ABNORMAL LOW (ref 36.0–46.0)
Hemoglobin: 11.5 g/dL — ABNORMAL LOW (ref 12.0–15.0)
MCH: 28.3 pg (ref 26.0–34.0)
MCHC: 32 g/dL (ref 30.0–36.0)
MCV: 88.4 fL (ref 78.0–100.0)
Platelets: 143 10*3/uL — ABNORMAL LOW (ref 150–400)
RBC: 4.06 MIL/uL (ref 3.87–5.11)
RDW: 18.2 % — ABNORMAL HIGH (ref 11.5–15.5)
WBC: 7 10*3/uL (ref 4.0–10.5)

## 2014-02-27 LAB — PROTIME-INR
INR: 2.7 — ABNORMAL HIGH (ref 0.00–1.49)
Prothrombin Time: 28.7 seconds — ABNORMAL HIGH (ref 11.6–15.2)

## 2014-02-27 LAB — BASIC METABOLIC PANEL
Anion gap: 14 (ref 5–15)
BUN: 39 mg/dL — AB (ref 6–23)
CO2: 26 meq/L (ref 19–32)
CREATININE: 1.75 mg/dL — AB (ref 0.50–1.10)
Calcium: 8.3 mg/dL — ABNORMAL LOW (ref 8.4–10.5)
Chloride: 99 mEq/L (ref 96–112)
GFR calc non Af Amer: 27 mL/min — ABNORMAL LOW (ref >90)
GFR, EST AFRICAN AMERICAN: 31 mL/min — AB (ref >90)
Glucose, Bld: 116 mg/dL — ABNORMAL HIGH (ref 70–99)
Potassium: 3.9 mEq/L (ref 3.7–5.3)
Sodium: 139 mEq/L (ref 137–147)

## 2014-02-27 LAB — GLUCOSE, CAPILLARY: Glucose-Capillary: 116 mg/dL — ABNORMAL HIGH (ref 70–99)

## 2014-02-27 MED ORDER — ACETAMINOPHEN 325 MG PO TABS: 650.0000 mg | ORAL_TABLET | Freq: Four times a day (QID) | ORAL | Status: AC | PRN

## 2014-02-27 MED ORDER — NORTRIPTYLINE HCL 75 MG PO CAPS: 75.0000 mg | ORAL_CAPSULE | Freq: Every day | ORAL | Status: AC

## 2014-02-27 NOTE — Care Management Note (Signed)
    Page 1 of 1   02/27/2014     11:29:17 AM CARE MANAGEMENT NOTE 02/27/2014  Patient:  [name], [name]   Account Number:  [id number]  Date Initiated:  02/27/2014  Documentation initiated by:  Tomi Bamberger  Subjective/Objective Assessment:   dx gib  admit- lives home alone.  Active with [facility] for HHRN, HHPT.     Action/Plan:   Anticipated DC Date:  02/27/2014   Anticipated DC Plan:  [place]  CM consult      Choice offered to / List presented to:  C-1 Patient        [place] arranged  HH-1 RN  [place].   Status of service:  Completed, signed off Medicare Important Message given?  YES (If response is "NO", the following Medicare IM given date fields will be blank) Date Medicare IM given:  02/27/2014 Medicare IM given by:  Tomi Bamberger Date Additional Medicare IM given:   Additional Medicare IM given by:    Discharge Disposition:  [place]  Per UR Regulation:  Reviewed for med. necessity/level of care/duration of stay  If discussed at [place] of Stay Meetings, dates discussed:    Comments:  827/15 [place], BSN [phone] patient lives alone, she is active with [facility] for [facility] [place], would like to continue. [place] notified that patient is for dc today.

## 2014-02-27 NOTE — Discharge Summary (Signed)
Physician Discharge Summary  DISCHARGE SUMMARY   Patient ID: [name] MR#: [id number] DOB/AGE: [id number] 78 y.o.   Attending 65 M  Patient's WUJ:WJXBJ,YNWG M, MD  Consults:  Cards.  Admit date: 02/24/2014 Discharge date: 02/27/2014  Discharge Diagnoses:  Principal Problem:   Lower GI bleed Active Problems:   Chronic anticoagulation   CKD (chronic kidney disease) stage 3, GFR 30-59 ml/min   Chronic systolic CHF (congestive heart failure)   Atrial fibrillation with RVR   Over-anticoagulated   Patient Active Problem List   Diagnosis Date Noted  . Over-anticoagulated 02/26/2014  . Lower GI bleed 02/24/2014  . CHF (congestive heart failure) 01/16/2014  . Encounter for therapeutic drug monitoring 08/07/2013  . Symptomatic anemia 07/23/2013  . Benign neoplasm of stomach 01/19/2013  . Adenomatous duodenal polyp 01/19/2013  . Diverticulosis of colon (without mention of hemorrhage) 01/19/2013  . DOE (dyspnea on exertion) 01/16/2013  . Bacteremia due to Escherichia coli 08/12/2012  . Coronary artery disease 08/12/2012  . Hx of CABG 08/12/2012  . Chronic combined systolic and diastolic congestive heart failure 08/12/2012  . Diabetes mellitus type 2 with complications 95/62/1308  . Acute on chronic renal failure 08/12/2012  . Pacemaker 08/12/2012  . Fever 08/07/2012  . Pneumonia 08/07/2012  . Weakness 08/07/2012  . Atrial fibrillation with RVR 07/26/2012  . Physical deconditioning 07/26/2012  . Acute respiratory failure 07/17/2012  . Pulmonary edema 07/17/2012  . Pleural effusion 07/17/2012  . Acute on chronic combined systolic and diastolic congestive heart failure, NYHA class 4 07/12/2012  . Cardiomyopathy-cause unknown ? rate related v ischemic 07/12/2012  . Diabetes mellitus   . Chronic anemia   . Long term (current) use of anticoagulants 06/04/2012  . Atrial flutter 05/30/2012  . Chronic systolic CHF (congestive heart failure) 05/28/2012   . Abscess of right thigh s/p I&D 05/07/2012 05/02/2012  . GI bleed 10/23/2011  . CKD (chronic kidney disease) stage 3, GFR 30-59 ml/min 10/23/2011  . MGUS (monoclonal gammopathy of unknown significance) 08/11/2011  . Atrial tachycardia 05/30/2011  . Right heart failure 05/30/2011  . Anemia associated with chronic renal failure 05/19/2011  . Diastolic dysfunction 65/78/4696  . Atrial fibrillation 11/26/2010  . Hypothyroidism 11/26/2010  . Chronic anticoagulation 11/26/2010  . CAD (coronary artery disease) 11/26/2010   Past Medical History  Diagnosis Date  . Coronary artery disease     a. Severe two vessel; s/p CABG (LIMA-LAD, SVG-diag, SVG-PDA);  b. 05/2012 NSTEMI in setting of rapid aflutter;  c. 06/2012 Lexiscan cardiolite: small, mild reversible defect in lateral wall->mild ischemia, low-risk->med Rx.  Marland Kitchen Hypertension   . Type II diabetes mellitus   . Hyperlipidemia   . Diabetic neuropathy   . CVA (cerebral vascular accident) 2004  . Hypothyroidism   . Chronic anemia   . GERD (gastroesophageal reflux disease)   . Arthritis   . Memory loss, short term   . COPD (chronic obstructive pulmonary disease)   . Blood transfusion without reported diagnosis   . Chronic combined systolic and diastolic CHF (congestive heart failure)     a. 07/2012 Echo: EF 30%, Gr II DD, Mild AS/MR, Mod-Sev TR, mild bi-atrial and RV dil, PASP 55mmHg.  . Moderate mitral regurgitation     a. mod by TEE 2012, mild by echo 2013, 07/2012  . Severe tricuspid regurgitation     a. Severe by TEE 2012, mod by echo 2013, mod-sev by echo 07/2012  . Chronic vulvitis 07/1993  . Idiopathic anemia 2006  . Atrial fibrillation  a. Failed amiodarone (prolonged QT), not a candidate for class IC meds due to her CAD. No Multaq due to CHF hx;  b. 07/2012 s/p SJM Anthem RF Bi-V PPM, ser # [id number] and AV node RFCA;  c. chronic coumadin.  . Atrial flutter   . Pacemaker   . GI bleed     a. 10/2011 - Dr Benson Norway did scope which showed  hiatal hernia, colon revealed diverticula (diverticular bleed), internal/ext hemorrhoids.  . GI bleed 02/25/2014    REQUIRING TRANSFUSIONS    Discharged Condition: Stable   Discharge Medications:   Medication List    STOP taking these medications       amitriptyline 25 MG tablet  Commonly known as:  ELAVIL     warfarin 5 MG tablet  Commonly known as:  COUMADIN      TAKE these medications       acetaminophen 325 MG tablet  Commonly known as:  TYLENOL  Take 2 tablets (650 mg total) by mouth every 6 (six) hours as needed for mild pain (or Fever >/= 101).     amiodarone 200 MG tablet  Commonly known as:  PACERONE  Take 100 mg by mouth daily.     atorvastatin 40 MG tablet  Commonly known as:  LIPITOR  Take 40 mg by mouth every evening.     diltiazem 240 MG 24 hr capsule  Commonly known as:  CARDIZEM CD  Take 240 mg by mouth daily.     donepezil 10 MG tablet  Commonly known as:  ARICEPT  Take 10 mg by mouth at bedtime.     esomeprazole 40 MG capsule  Commonly known as:  NEXIUM  Take 40 mg by mouth daily before breakfast.     ezetimibe 10 MG tablet  Commonly known as:  ZETIA  Take 10 mg by mouth every evening.     furosemide 20 MG tablet  Commonly known as:  LASIX  Take 40-60 mg by mouth 2 (two) times daily. 60 mg in the am and 40 mg in the pm     ICAPS AREDS FORMULA PO  Take 1 tablet by mouth 2 (two) times daily.     insulin glargine 100 UNIT/ML injection  Commonly known as:  LANTUS  Inject 50 Units into the skin 2 (two) times daily.     insulin lispro 100 UNIT/ML injection  Commonly known as:  HUMALOG  - Inject 20-25 Units into the skin 2 (two) times daily as needed for high blood sugar. Home sliding scale  - If blood sugar is <200 take 20 units  - If blood sugar is >200 take 25 units  - If blood sugar is less than 70, don't take any     levothyroxine 100 MCG tablet  Commonly known as:  SYNTHROID, LEVOTHROID  Take 100 mcg by mouth every morning.      metoprolol succinate 50 MG 24 hr tablet  Commonly known as:  TOPROL-XL  Take 50 mg by mouth daily. Take with or immediately following a meal.     nitroGLYCERIN 0.4 MG SL tablet  Commonly known as:  NITROSTAT  Place 0.4 mg under the tongue every 5 (five) minutes as needed. For chest pain     nortriptyline 75 MG capsule  Commonly known as:  PAMELOR  Take 1 capsule (75 mg total) by mouth at bedtime.     potassium chloride SA 20 MEQ tablet  Commonly known as:  K-DUR,KLOR-CON  Take 2 tablets (40 mEq total)  by mouth 2 (two) times daily.        Hospital Procedures: No results found.  History of Present Illness:  80 year old female followed by Dr. Martinique and Dr. Lovena Le of cardiology as well as Dr. Virgina Jock from her primary care perspective.  She has multiple medical problems including chronic atrial fibrillation, cardiomyopathy, on chronic warfarin therapy with history of AV node ablation, biventricular pacemaker insertion approximately one year ago with an ejection fraction thought to be around 30-45% in the setting of known CAD status post CABG, last nuclear stress test 12/13 with low risk. She's had a recent evaluation and hospitalization for heart failure requiring diuresis complicated by smoldering GI bleed, followed by Dr. Benson Norway from a GI perspective, and indeed it does have a history of diverticulosis with possible bleed as well as known hemorrhoids in the setting of her anticoagulation which do bleed on occasion. She's also has anemia of chronic disease followed by Dr. Payton Emerald with Aranesp injections up to twice monthly. She's had numerous adjustments for diuretics by cardiology. This is based on volume status as well as weight. She has chronic dyspnea on exertion none at rest the and 2 pillow orthopnea. She is an insulin-dependent diabetic and self administers insulin twice a day, with no history of recent hypoglycemia some exacerbations based on caloric intake.  She was seen earlier  8/24 by PA at [facility] heart care, was thought to be stable however she was also seen by Coumadin clinic given worsening hematochezia that morning but ongoing for the last several weeks to a minor extent.  INR was 8 and she was sent to ED.  She admitted to having hemorrhoids and straining during bowel movements. She does feel tired.  She was admitted for reversal of her PT/INR and stabilization. In ED she was hemodynamically stable.    Hospital Course:   Admitted c LGIB secondary to Supratherapeutic INR --presumably secondary to oozing and bleeding hemorrhoids in conjunction with a history of diverticulosis.  She remained hemodynamically stable. She was initially admitted to Step down and transferred to tele on 8/26.  There was no reason to get GI involved as Dr Benson Norway did Colon and EGD on her in 2014. He has scoped her 01/2013 = Diverticulosis and gastric/duodenalpolyps. INR's trending down. S/P 2 doses Vit K (5 and 2.5 mg). Coumadin reversed and held.  Will hold coumadin for @ 1-2 weeks after d/c and have her see the coumadin clinic at [facility] care and restart.  After 4 U PRBCs her Hbg stabilized.  Cards saw pt and b/c CHADS-VASc score is 8 (1-female, 1-HTN, 1-DM, 2-Age, 1-HF, 2-CVA) which places her at a 10.8% risk of stroke per year cards advocates for continued use of anticoagulation with close and careful monitoring once her acute bleeding has resolved.   Seen by Dr Burt Knack 8/26 and his note stated:  Pt with recurrent subacute GI bleeding and anemia in setting of supratherapeutic INR. Note CHADS-Vasc = 8. Need to hold coumadin at least a few weeks in my opinion. Would like to see HgB back to baseline at follow-up before restarting anticoagulation. Pt currently AV-paced. Will interrogate device to evaluate AF burden as this may impact anticoag decisions.  INR's 3.7 (8/5)  - 8 - 6.98 - 4 - 3.06 S/P Vit K x 2 .  S/P 2 Units PRBC and Hbg went from 7.2 and settled out at 8.  She received another 2 units and  now Hbg 11.5 She feels better. SOB is better.  Melena and bleeding is better  She was on Clears and advanced to CHO reduced diet 8/26.  Sugars have remained OK despite lowering of Insulin dosing.  Appreciate Cards consult and input  Her Sxatic Anemia is better.  She will follow up with Dr Juliann Mule for ongoing Aranesp.   CHF - stable. Remains on Lasix 60 mg in the morning and 40 mg in the evening.  Wt @ 220 is near where she needs to be. She had no issues with her CAD/CABG/CM/Pacer/Mild AS/ AFib - currently regular and paced DM2 - - continue insulin- and resume home regimen  CKD 3 - Cr 1.75 - 2.2 and stable/fine for her.  1.75 on 8/26.  She is back to her baseline! Mild Dementia - No issues. On Meds  In last 24 hrs she is stable.  She is walking. She is Paced on monitor. No Edema. No SOB. No active bleeding - minor melena but much better. She had solids for dinner via CHO reduced. D/c today.  [facility] is providing the following services: RN and PT and will resume post D/c      Day of Discharge Exam BP 112/68  Pulse 71  Temp(Src) 98.2 F (36.8 C) (Oral)  Resp 20  Ht 5\' 3"  (1.6 m)  Wt 99.927 kg (220 lb 4.8 oz)  BMI 39.03 kg/m2  SpO2 96%  Physical Exam: General appearance: A and O.  Eyes: no scleral icterus  Throat: oropharynx moist without erythema  Resp: Distant but clear  Cardio: Reg/Sternotomy/m/Pacer. Paced on Tele  GI: soft, non-tender; bowel sounds normal; no masses, no organomegaly  Extremities: min edema for her.  Lots of venous stasis.  Discharge Labs:  Recent Labs  02/25/14 0614 02/26/14 0340  NA 141 143  K 4.2 3.8  CL 103 105  CO2 27 27  GLUCOSE 187* 98  BUN 61* 51*  CREATININE 1.84* 1.75*  CALCIUM 8.4 8.2*    Recent Labs  02/25/14 0614  AST 27  ALT 16  ALKPHOS 103  BILITOT 0.3  PROT 6.7  ALBUMIN 2.7*    Recent Labs  02/24/14 2136  02/26/14 0340 02/26/14 1523 02/27/14 0642  WBC 7.6  < > 8.0  --  7.0  NEUTROABS 4.5  --   --   --   --   HGB  7.2*  < > 8.0* 11.2* 11.5*  HCT 22.8*  < > 25.0* 33.9* 35.9*  MCV 87.7  < > 87.1  --  88.4  PLT 160  < > 145*  --  143*  < > = values in this interval not displayed. No results found for this basename: CKTOTAL, CKMB, CKMBINDEX, TROPONINI,  in the last 72 hours No results found for this basename: TSH, T4TOTAL, FREET3, T3FREE, THYROIDAB,  in the last 72 hours No results found for this basename: VITAMINB12, FOLATE, FERRITIN, TIBC, IRON, RETICCTPCT,  in the last 72 hours Lab Results  Component Value Date   INR 3.06* 02/26/2014   INR 4.04* 02/26/2014   INR 6.98* 02/25/2014       Discharge instructions:  06-Home-Health Care Svc     Follow-up Information   Follow up with [name], MD In 1 week.   Specialty:  Internal Medicine   Contact information:   [place] [address] [phone]       Follow up with Peter Martinique, MD In 2 weeks. (Follow up Coumadin clinic associated with Dr Martinique in 2 weeks to restart coumadin)    Specialty:  Cardiology   Contact information:   [address] [place] [address] [phone]        Disposition: Home c family  Follow-up Appts: Follow-up with Dr. Virgina Jock at [facility] in 1 week.  Call for appointment.  Condition on Discharge: stable/better  Tests Needing Follow-up: CBC/BMET Will need to see Cards/Coumadin clinic at [facility] in @ 2 weeks and restart Coumadin  Time spent in discharge (includes decision making & examination of pt): 45 min  Signed: Dyna Figuereo M 02/27/2014, 7:18 AM

## 2014-02-27 NOTE — Progress Notes (Signed)
Patient discharge teaching given, including activity, diet, follow-up appoints, and medications. Called Dr Virgina Jock about nortriptyline prescritpion, and he stated he had already given the pt one from his office.  Patient verbalized understanding of all discharge instructions. IV access was d/c'd. Vitals are stable. Skin is intact except as charted in most recent assessments. Pt to be escorted out by NT, to be driven home by family.

## 2014-02-27 NOTE — Progress Notes (Signed)
Per [employer] representative, at [employer] since July 17, in atrial fibrillation less than 1% of the time.

## 2014-02-28 ENCOUNTER — Ambulatory Visit: Payer: Medicare Other | Admitting: Pharmacist Clinician (PhC)/ Clinical Pharmacy Specialist

## 2014-02-28 ENCOUNTER — Telehealth: Payer: Self-pay | Admitting: Cardiology

## 2014-02-28 NOTE — Telephone Encounter (Signed)
Hospitalized for GI bleed - warfarin was stopped at discharge.   Please advise on working patient is/how urgent hospital f/up is

## 2014-02-28 NOTE — Telephone Encounter (Signed)
Spoke to patient post hospital appointment scheduled with Dr.Jordan 04/01/14 at 12:00 noon.

## 2014-02-28 NOTE — Telephone Encounter (Signed)
John called in wanting to make a 2 wk post hosp f/u appt with Dr. Martinique. The doctor did not have and anything available in 2 wks and neither did an extender. I scheduled her for a coumadin appt on 9/11 if there was some way that she could be fit in around that time. Please call  Thanks

## 2014-03-03 ENCOUNTER — Encounter: Payer: Self-pay | Admitting: Internal Medicine

## 2014-03-04 ENCOUNTER — Other Ambulatory Visit: Payer: Self-pay

## 2014-03-04 ENCOUNTER — Telehealth: Payer: Self-pay | Admitting: Cardiology

## 2014-03-04 MED ORDER — DILTIAZEM HCL ER COATED BEADS 240 MG PO CP24: 240.0000 mg | ORAL_CAPSULE | Freq: Every day | ORAL | Status: AC

## 2014-03-04 NOTE — Telephone Encounter (Signed)
Please call,says he need to talk to you about an appt you gave the pt.

## 2014-03-04 NOTE — Telephone Encounter (Signed)
Returned call to patient's husband he stated wife forgot appointment date and time.Post hospital appointment scheduled with Dr.Jordan 04/01/14 at 12:00 noon.INR appointment 03/14/14 at 10:50 am.

## 2014-03-06 ENCOUNTER — Ambulatory Visit (HOSPITAL_BASED_OUTPATIENT_CLINIC_OR_DEPARTMENT_OTHER): Payer: Medicare Other

## 2014-03-06 ENCOUNTER — Other Ambulatory Visit (HOSPITAL_BASED_OUTPATIENT_CLINIC_OR_DEPARTMENT_OTHER): Payer: Medicare Other

## 2014-03-06 VITALS — BP 133/61 | HR 72 | Temp 98.9°F

## 2014-03-06 DIAGNOSIS — N039 Chronic nephritic syndrome with unspecified morphologic changes: Secondary | ICD-10-CM

## 2014-03-06 DIAGNOSIS — Z23 Encounter for immunization: Secondary | ICD-10-CM

## 2014-03-06 DIAGNOSIS — N189 Chronic kidney disease, unspecified: Secondary | ICD-10-CM

## 2014-03-06 DIAGNOSIS — D649 Anemia, unspecified: Secondary | ICD-10-CM

## 2014-03-06 DIAGNOSIS — N183 Chronic kidney disease, stage 3 (moderate): Secondary | ICD-10-CM

## 2014-03-06 DIAGNOSIS — D631 Anemia in chronic kidney disease: Secondary | ICD-10-CM

## 2014-03-06 DIAGNOSIS — N179 Acute kidney failure, unspecified: Secondary | ICD-10-CM

## 2014-03-06 LAB — CBC WITH DIFFERENTIAL/PLATELET
BASO%: 0.6 % (ref 0.0–2.0)
BASOS ABS: 0 10*3/uL (ref 0.0–0.1)
EOS ABS: 0.1 10*3/uL (ref 0.0–0.5)
EOS%: 2.2 % (ref 0.0–7.0)
HCT: 34.2 % — ABNORMAL LOW (ref 34.8–46.6)
HEMOGLOBIN: 10.8 g/dL — AB (ref 11.6–15.9)
LYMPH%: 16.2 % (ref 14.0–49.7)
MCH: 28.6 pg (ref 25.1–34.0)
MCHC: 31.6 g/dL (ref 31.5–36.0)
MCV: 90.5 fL (ref 79.5–101.0)
MONO#: 1 10*3/uL — ABNORMAL HIGH (ref 0.1–0.9)
MONO%: 16.2 % — ABNORMAL HIGH (ref 0.0–14.0)
NEUT%: 64.8 % (ref 38.4–76.8)
NEUTROS ABS: 4.1 10*3/uL (ref 1.5–6.5)
Platelets: 192 10*3/uL (ref 145–400)
RBC: 3.78 10*6/uL (ref 3.70–5.45)
RDW: 17.1 % — ABNORMAL HIGH (ref 11.2–14.5)
WBC: 6.4 10*3/uL (ref 3.9–10.3)
lymph#: 1 10*3/uL (ref 0.9–3.3)
nRBC: 0 % (ref 0–0)

## 2014-03-06 MED ORDER — INFLUENZA VAC SPLIT QUAD 0.5 ML IM SUSY
0.5000 mL | PREFILLED_SYRINGE | Freq: Once | INTRAMUSCULAR | Status: AC
  Administered 2014-03-06: 0.5 mL via INTRAMUSCULAR
  Filled 2014-03-06: qty 0.5

## 2014-03-06 MED ORDER — DARBEPOETIN ALFA-POLYSORBATE 200 MCG/0.4ML IJ SOLN
200.0000 ug | Freq: Once | INTRAMUSCULAR | Status: AC
  Administered 2014-03-06: 200 ug via SUBCUTANEOUS
  Filled 2014-03-06: qty 0.4

## 2014-03-14 ENCOUNTER — Ambulatory Visit (INDEPENDENT_AMBULATORY_CARE_PROVIDER_SITE_OTHER): Payer: Medicare Other | Admitting: Pharmacist Clinician (PhC)/ Clinical Pharmacy Specialist

## 2014-03-14 DIAGNOSIS — Z5181 Encounter for therapeutic drug level monitoring: Secondary | ICD-10-CM

## 2014-03-14 DIAGNOSIS — I4891 Unspecified atrial fibrillation: Secondary | ICD-10-CM

## 2014-03-14 DIAGNOSIS — Z7901 Long term (current) use of anticoagulants: Secondary | ICD-10-CM

## 2014-03-14 NOTE — Progress Notes (Signed)
Pt still having rectal bleeding from hemorrhoids.  States rectal area very tender and sensitive.  Advised she try anusol suppositories and Tucks wipes to help.  Will have her restart warfarin on Monday at 7.5mg  daily, and repeat INR 10 days after starting.  Pt and spouse voiced understanding

## 2014-03-18 ENCOUNTER — Telehealth: Payer: Self-pay | Admitting: Hematology

## 2014-03-18 NOTE — Telephone Encounter (Signed)
Lvm advising 9/17 appt has been moved to 9/18 @ 8.15am with [place] due to md out of office.

## 2014-03-20 ENCOUNTER — Ambulatory Visit: Payer: Medicare Other

## 2014-03-20 ENCOUNTER — Other Ambulatory Visit: Payer: Medicare Other

## 2014-03-21 ENCOUNTER — Ambulatory Visit: Payer: Medicare Other | Admitting: Oncology

## 2014-03-21 ENCOUNTER — Ambulatory Visit: Payer: Medicare Other

## 2014-03-21 ENCOUNTER — Other Ambulatory Visit (HOSPITAL_BASED_OUTPATIENT_CLINIC_OR_DEPARTMENT_OTHER): Payer: Medicare Other

## 2014-03-21 ENCOUNTER — Other Ambulatory Visit: Payer: Medicare Other

## 2014-03-21 ENCOUNTER — Encounter: Payer: Self-pay | Admitting: Oncology

## 2014-03-21 ENCOUNTER — Ambulatory Visit (HOSPITAL_BASED_OUTPATIENT_CLINIC_OR_DEPARTMENT_OTHER): Payer: Medicare Other

## 2014-03-21 ENCOUNTER — Telehealth: Payer: Self-pay | Admitting: Hematology

## 2014-03-21 ENCOUNTER — Ambulatory Visit (HOSPITAL_BASED_OUTPATIENT_CLINIC_OR_DEPARTMENT_OTHER): Payer: Medicare Other | Admitting: Oncology

## 2014-03-21 VITALS — BP 138/51 | HR 78 | Temp 98.5°F | Resp 22 | Ht 63.0 in | Wt 230.4 lb

## 2014-03-21 DIAGNOSIS — I509 Heart failure, unspecified: Secondary | ICD-10-CM

## 2014-03-21 DIAGNOSIS — K649 Unspecified hemorrhoids: Secondary | ICD-10-CM

## 2014-03-21 DIAGNOSIS — I4891 Unspecified atrial fibrillation: Secondary | ICD-10-CM

## 2014-03-21 DIAGNOSIS — D631 Anemia in chronic kidney disease: Secondary | ICD-10-CM

## 2014-03-21 DIAGNOSIS — N039 Chronic nephritic syndrome with unspecified morphologic changes: Secondary | ICD-10-CM | POA: Diagnosis not present

## 2014-03-21 DIAGNOSIS — D500 Iron deficiency anemia secondary to blood loss (chronic): Secondary | ICD-10-CM

## 2014-03-21 DIAGNOSIS — N189 Chronic kidney disease, unspecified: Secondary | ICD-10-CM | POA: Diagnosis not present

## 2014-03-21 DIAGNOSIS — D472 Monoclonal gammopathy: Secondary | ICD-10-CM

## 2014-03-21 DIAGNOSIS — D649 Anemia, unspecified: Secondary | ICD-10-CM

## 2014-03-21 DIAGNOSIS — N183 Chronic kidney disease, stage 3 (moderate): Secondary | ICD-10-CM

## 2014-03-21 DIAGNOSIS — N179 Acute kidney failure, unspecified: Secondary | ICD-10-CM

## 2014-03-21 LAB — CBC WITH DIFFERENTIAL/PLATELET
BASO%: 1.1 % (ref 0.0–2.0)
BASOS ABS: 0.1 10*3/uL (ref 0.0–0.1)
EOS ABS: 0.1 10*3/uL (ref 0.0–0.5)
EOS%: 2.3 % (ref 0.0–7.0)
HEMATOCRIT: 32.1 % — AB (ref 34.8–46.6)
HEMOGLOBIN: 10.1 g/dL — AB (ref 11.6–15.9)
LYMPH#: 0.8 10*3/uL — AB (ref 0.9–3.3)
LYMPH%: 14.7 % (ref 14.0–49.7)
MCH: 28 pg (ref 25.1–34.0)
MCHC: 31.6 g/dL (ref 31.5–36.0)
MCV: 88.7 fL (ref 79.5–101.0)
MONO#: 1.3 10*3/uL — ABNORMAL HIGH (ref 0.1–0.9)
MONO%: 22.4 % — ABNORMAL HIGH (ref 0.0–14.0)
NEUT#: 3.5 10*3/uL (ref 1.5–6.5)
NEUT%: 59.5 % (ref 38.4–76.8)
Platelets: 157 10*3/uL (ref 145–400)
RBC: 3.62 10*6/uL — ABNORMAL LOW (ref 3.70–5.45)
RDW: 17.5 % — ABNORMAL HIGH (ref 11.2–14.5)
WBC: 5.8 10*3/uL (ref 3.9–10.3)

## 2014-03-21 LAB — BASIC METABOLIC PANEL (CC13)
Anion Gap: 11 mEq/L (ref 3–11)
BUN: 40.9 mg/dL — AB (ref 7.0–26.0)
CHLORIDE: 102 meq/L (ref 98–109)
CO2: 26 mEq/L (ref 22–29)
Calcium: 8.9 mg/dL (ref 8.4–10.4)
Creatinine: 2.1 mg/dL — ABNORMAL HIGH (ref 0.6–1.1)
GLUCOSE: 137 mg/dL (ref 70–140)
Potassium: 3.9 mEq/L (ref 3.5–5.1)
Sodium: 138 mEq/L (ref 136–145)

## 2014-03-21 MED ORDER — DARBEPOETIN ALFA-POLYSORBATE 200 MCG/0.4ML IJ SOLN
200.0000 ug | Freq: Once | INTRAMUSCULAR | Status: AC
  Administered 2014-03-21: 200 ug via SUBCUTANEOUS
  Filled 2014-03-21: qty 0.4

## 2014-03-21 NOTE — Progress Notes (Signed)
[place] OFFICE PROGRESS NOTE  [name], MD [place] [address]  DIAGNOSIS: Anemia associated with chronic renal failure, unspecified stage - Plan: CBC with Differential  No chief complaint on file.   CURRENT THERAPY: Aranesp 200 mcg subcutaneous every two weeks for a hemoglobin less than 11.    INTERVAL HISTORY: [name] Touch 80 y.o. female with a history of IgG lambda MGUS, Anemia associated with chronic renal failure who presents for follow up. Today, she is accompanied by her husband. She had a recent hospitalization due to the lower GI bleed. She was found to have a supratherapeutic INR 8. She received blood transfusions and her Coumadin was stopped for a brief period of time. She is now back on Coumadin with close monitoring by cardiology. She denies any recent fevers , chest pain, abdominal pain, nausea, and vomiting. She does note a small amount of blood due to her hemorrhoids. She reports dyspnea on exertion on walking short distances as well as edema to her lower extremities. She does have a history of CHF with a left ventricular ejection fraction of approximately 30%. Her primary care provider saw her earlier this week and increased her Lasix to 80 mg in the morning and 60 mg in the evening.  MEDICAL HISTORY: Past Medical History  Diagnosis Date  . Coronary artery disease     a. Severe two vessel; s/p CABG (LIMA-LAD, SVG-diag, SVG-PDA);  b. 05/2012 NSTEMI in setting of rapid aflutter;  c. 06/2012 Lexiscan cardiolite: small, mild reversible defect in lateral wall->mild ischemia, low-risk->med Rx.  Marland Kitchen Hypertension   . Type II diabetes mellitus   . Hyperlipidemia   . Diabetic neuropathy   . CVA (cerebral vascular accident) 2004  . Hypothyroidism   . Chronic anemia   . GERD (gastroesophageal reflux disease)   . Arthritis   . Memory loss, short term   . COPD (chronic obstructive pulmonary disease)   . Blood transfusion without reported  diagnosis   . Chronic combined systolic and diastolic CHF (congestive heart failure)     a. 07/2012 Echo: EF 30%, Gr II DD, Mild AS/MR, Mod-Sev TR, mild bi-atrial and RV dil, PASP 92mmHg.  . Moderate mitral regurgitation     a. mod by TEE 2012, mild by echo 2013, 07/2012  . Severe tricuspid regurgitation     a. Severe by TEE 2012, mod by echo 2013, mod-sev by echo 07/2012  . Chronic vulvitis 07/1993  . Idiopathic anemia 2006  . Atrial fibrillation     a. Failed amiodarone (prolonged QT), not a candidate for class IC meds due to her CAD. No Multaq due to CHF hx;  b. 07/2012 s/p SJM Anthem RF Bi-V PPM, ser # [id number] and AV node RFCA;  c. chronic coumadin.  . Atrial flutter   . Pacemaker   . GI bleed     a. 10/2011 - Dr Benson Norway did scope which showed hiatal hernia, colon revealed diverticula (diverticular bleed), internal/ext hemorrhoids.  . GI bleed 02/25/2014    REQUIRING TRANSFUSIONS    INTERIM HISTORY: has Atrial fibrillation; Hypothyroidism; Chronic anticoagulation; CAD (coronary artery disease); Diastolic dysfunction; Anemia associated with chronic renal failure; Atrial tachycardia; Right heart failure; MGUS (monoclonal gammopathy of unknown significance); GI bleed; CKD (chronic kidney disease) stage 3, GFR 30-59 ml/min; Abscess of right thigh s/p I&D 66/10/4032; Chronic systolic CHF (congestive heart failure); Atrial flutter; Long term (current) use of anticoagulants; Acute on chronic combined systolic and diastolic congestive heart failure, NYHA class  4; Diabetes mellitus; Chronic anemia; Cardiomyopathy-cause unknown ? rate related v ischemic; Acute respiratory failure; Pulmonary edema; Pleural effusion; Atrial fibrillation with RVR; Physical deconditioning; Fever; Pneumonia; Weakness; Bacteremia due to Escherichia coli; Coronary artery disease; Hx of CABG; Chronic combined systolic and diastolic congestive heart failure; Diabetes mellitus type 2 with complications; Acute on chronic renal failure;  Pacemaker; DOE (dyspnea on exertion); Benign neoplasm of stomach; Adenomatous duodenal polyp; Diverticulosis of colon (without mention of hemorrhage); Symptomatic anemia; Encounter for therapeutic drug monitoring; CHF (congestive heart failure); Lower GI bleed; and Over-anticoagulated on her problem list.    ALLERGIES:  is allergic to penicillins; betadine; capoten; codone; methadone; and propoxyphene.  MEDICATIONS: has a current medication list which includes the following prescription(s): acetaminophen, amiodarone, atorvastatin, diltiazem, donepezil, esomeprazole, ezetimibe, furosemide, insulin glargine, insulin lispro, levothyroxine, metoprolol succinate, multiple vitamins-minerals, nortriptyline, potassium chloride sa, and nitroglycerin.  SURGICAL HISTORY:  Past Surgical History  Procedure Laterality Date  . Coronary artery bypass graft  06/2009    X3, LIMA to LAD, SVG to diagonal, SVG to the posterior descending artery.  . Tonsillectomy    . Knee arthroscopy  08/2001  . Transthoracic echocardiogram  11/12/2010     Ejection fraction felt to be around 50%.  Wall thickness was increased in a pattern of mild LVH.  Moderately dilated left atrium.  Mildly dilated right atrium. Right ventricle was mildly dilated with mildly reduced systolic function  . Cardiac catheterization  06/09/2009    inferior wall hypokinesia with ejection fraction of 55%.  . Abdominal hysterectomy    . Joint replacement    . Tee without cardioversion  05/23/2011    Procedure: TRANSESOPHAGEAL ECHOCARDIOGRAM (TEE);  Surgeon: Lelon Perla, MD;  Location: [facility];  Service: Cardiovascular;  Laterality: N/A;  . Incision and drainage abscess  05/07/2012    Procedure: INCISION AND DRAINAGE ABSCESS;  Surgeon: Adin Hector, MD;  Location: [facility];  Service: General;  Laterality: N/A;  Groin wound  . Total abdominal hysterectomy  1979  . Breast biopsy  Left  . Bilateral salpingectomy  10/2000  . Breast biopsy  09/18/2009     fibrocystic change  . Coronary artery bypass graft  06/2009  . Pacemaker insertion  08/2012  . Colonoscopy N/A 01/19/2013    Procedure: COLONOSCOPY;  Surgeon: Inda Castle, MD;  Location: [facility];  Service: Endoscopy;  Laterality: N/A;  . Esophagogastroduodenoscopy N/A 01/19/2013    Procedure: ESOPHAGOGASTRODUODENOSCOPY (EGD);  Surgeon: Inda Castle, MD;  Location: [facility];  Service: Endoscopy;  Laterality: N/A;    REVIEW OF SYSTEMS:   Constitutional: Denies fevers, chills or abnormal weight loss Eyes: Denies blurriness of vision Ears, nose, mouth, throat, and face: Denies mucositis or sore throat Respiratory: Denies cough, or wheezes. Positive for dyspnea on exertion. Cardiovascular: Denies palpitation, chest discomfort. Positive for lower extremity swelling. Gastrointestinal:  Denies nausea, heartburn or change in bowel habits Skin: Denies abnormal skin rashes Lymphatics: Denies new lymphadenopathy or easy bruising Neurological:Denies numbness, tingling or new weaknesses Behavioral/Psych: Mood is stable, no new changes  All other systems were reviewed with the patient and are negative.  PHYSICAL EXAMINATION: ECOG PERFORMANCE STATUS: 1 - Symptomatic but completely ambulatory  Blood pressure 138/51, pulse 78, temperature 98.5 F (36.9 C), temperature source Oral, resp. rate 22, height 5\' 3"  (1.6 m), weight 230 lb 6.4 oz (104.509 kg).   GENERAL:alert, no distress and comfortable; moderately obese, sitting in wheelchair. SKIN: skin color, texture, turgor are normal, no rashes or significant lesions EYES: normal,  Conjunctiva are pink and non-injected, sclera clear OROPHARYNX:no exudate, no erythema and lips, buccal mucosa, and tongue normal  NECK: supple, thyroid normal size, non-tender, without nodularity LYMPH:  no palpable lymphadenopathy in the cervical, axillary or supraclavicular LUNGS: clear to auscultation and percussion with normal breathing effort HEART:  RR  with pacemaker in the left infraclavicular area. Chronic lower extremity brawny edema with stasis changes.  ABDOMEN:abdomen soft, non-tender and normal bowel sounds Musculoskeletal:no cyanosis of digits and no clubbing  NEURO: alert & oriented x 3 with fluent speech, no focal motor/sensory deficits  Labs:  Lab Results  Component Value Date   WBC 5.8 03/21/2014   HGB 10.1* 03/21/2014   HCT 32.1* 03/21/2014   MCV 88.7 03/21/2014   PLT 157 03/21/2014   NEUTROABS 3.5 03/21/2014      Chemistry      Component Value Date/Time   NA 138 03/21/2014 0924   NA 139 02/27/2014 0642   K 3.9 03/21/2014 0924   K 3.9 02/27/2014 0642   CL 99 02/27/2014 0642   CL 105 08/30/2012 1108   CO2 26 03/21/2014 0924   CO2 26 02/27/2014 0642   BUN 40.9* 03/21/2014 0924   BUN 39* 02/27/2014 0642   CREATININE 2.1* 03/21/2014 0924   CREATININE 1.75* 02/27/2014 0642   CREATININE 1.98* 02/05/2014 1134      Component Value Date/Time   CALCIUM 8.9 03/21/2014 0924   CALCIUM 8.3* 02/27/2014 0642   ALKPHOS 103 02/25/2014 0614   ALKPHOS 127 01/23/2014 1341   AST 27 02/25/2014 0614   AST 30 01/23/2014 1341   ALT 16 02/25/2014 0614   ALT 19 01/23/2014 1341   BILITOT 0.3 02/25/2014 0614   BILITOT 0.47 01/23/2014 1341     CBC:  Recent Labs Lab 03/21/14 0924  WBC 5.8  NEUTROABS 3.5  HGB 10.1*  HCT 32.1*  MCV 88.7  PLT 157   Studies:  No results found.   RADIOGRAPHIC STUDIES: No results found.  ASSESSMENT: Yolani Vo Giddens 80 y.o. female with a history of Anemia associated with chronic renal failure, unspecified stage - Plan: CBC with Differential   PLAN:  1. Anemia of chronic renal failure, with some intermittent chronic blood loss due to hemorrhoids.  --She feels tired in general but denies chest discomfort.  Her hemoglobin is 10.1 today.  She received her aranesp 200 mcg subcutaneous.  We will continue giving her aranesp 200 mcg every two weeks  due to increasing blood transfusions requirements. She will continue to  have CBCs every 2 weeks and have aranesp as indicated.  Her history is also notable for angiodyplasia and diverticulosis and hemorrhoids resulting in heme positive stool.    2. Afib/CHF/Pacer --Followed by cardiology.  Her medtronic biventricular pacemaker was working normally on 12/09 and recently checked 05/18.  She will continue amiodarone 100 mg daily.  3. CKDz. --Her creatinine is 2.1. Creatinine clearance is 25.1 mL/min. this is stable.   4. MGUS. --Her IgG levels remains stable. Her IgG level was 1990 on 09/27/2013; it is 1920 on 02/06/2014 with a normal kappa:lamda ratio. These are being checked about every 6 months.  5. Follow-up.   --Patient will return to clinic in 6 weeks for CBC, CMP and iron studies.  She will have labs in 2 weeks for consideration of aranesp.  All questions were answered. The patient knows to call the clinic with any problems, questions or concerns. We can certainly see the patient much sooner if necessary.  I spent 20 minutes  counseling the patient face to face. The total time spent in the appointment was 30 minutes.    Mikey Bussing, NP 03/21/2014 4:11 PM

## 2014-03-21 NOTE — Telephone Encounter (Signed)
gv adn printed appt sched and avs for pt for OCT °

## 2014-03-31 ENCOUNTER — Ambulatory Visit (INDEPENDENT_AMBULATORY_CARE_PROVIDER_SITE_OTHER): Payer: 59 | Admitting: Podiatry

## 2014-03-31 ENCOUNTER — Encounter: Payer: Self-pay | Admitting: Podiatry

## 2014-03-31 DIAGNOSIS — M79609 Pain in unspecified limb: Secondary | ICD-10-CM

## 2014-03-31 DIAGNOSIS — M79676 Pain in unspecified toe(s): Secondary | ICD-10-CM

## 2014-03-31 DIAGNOSIS — B351 Tinea unguium: Secondary | ICD-10-CM

## 2014-04-01 ENCOUNTER — Encounter: Payer: Self-pay | Admitting: Cardiology

## 2014-04-01 ENCOUNTER — Ambulatory Visit (INDEPENDENT_AMBULATORY_CARE_PROVIDER_SITE_OTHER): Payer: Medicare Other | Admitting: Cardiology

## 2014-04-01 VITALS — BP 140/68 | HR 72 | Ht 63.5 in | Wt 228.8 lb

## 2014-04-01 DIAGNOSIS — I25810 Atherosclerosis of coronary artery bypass graft(s) without angina pectoris: Secondary | ICD-10-CM

## 2014-04-01 DIAGNOSIS — Z950 Presence of cardiac pacemaker: Secondary | ICD-10-CM

## 2014-04-01 DIAGNOSIS — I4891 Unspecified atrial fibrillation: Secondary | ICD-10-CM

## 2014-04-01 DIAGNOSIS — Z951 Presence of aortocoronary bypass graft: Secondary | ICD-10-CM

## 2014-04-01 DIAGNOSIS — I209 Angina pectoris, unspecified: Secondary | ICD-10-CM

## 2014-04-01 DIAGNOSIS — I5022 Chronic systolic (congestive) heart failure: Secondary | ICD-10-CM

## 2014-04-01 DIAGNOSIS — I509 Heart failure, unspecified: Secondary | ICD-10-CM

## 2014-04-01 DIAGNOSIS — I25718 Atherosclerosis of autologous vein coronary artery bypass graft(s) with other forms of angina pectoris: Secondary | ICD-10-CM

## 2014-04-01 DIAGNOSIS — K922 Gastrointestinal hemorrhage, unspecified: Secondary | ICD-10-CM

## 2014-04-01 NOTE — Progress Notes (Signed)
Patient ID: [name], female   DOB: 06/06/1933, 80 y.o.   MRN: [id number]  Subjective: This patient presents complaining of painful toenails  Objective: Hypertrophic, elongated, incurvated toenails 6-10  Assessment: Symptomatic onychomycoses x10 History of diabetic with peripheral neuropathy  Plan: Nails x10 are debrided without a bleeding  Reappoint x3 months

## 2014-04-01 NOTE — Patient Instructions (Signed)
Continue your current therapy  Continue sodium restriction.

## 2014-04-01 NOTE — Progress Notes (Signed)
[name] Date of Birth: 11/07/1932 Medical Record #[id number]  History of Present Illness: [name] is seen back today for a follow up visit.  She has had atrial fib with RVR and CHF. EF was down as low as 30%. Afib could not be regulated. Not a candidate for antiarrhythmics due to her multiple comorbidities/intolerances. A BiV pacemaker was implanted followed by AV nodal ablation in December 2013.  Echo was repeated in February 2014 and had improved with an EF up to 45 to 50%. Most recent Echo in 12/14 showed an EF of 40-45%. She has multiple medical problems including CKD stage 3, GI bleed, Chronic anemia, depression, dementia. She was recently hospitalized 8/25-8/27/15 with a GI bleed related to supratherapeutic INR of 8. Coumadin was held. Volume status was stable with DC weight of 220 lbs. Since DC she states she is doing OK. Still noted some hematochezia- mild intermittent. She gets SOB with any activity. Swelling is stable. DC meds listed her taking lasix 60 in am and 40 in pm. Her husband states she is now taking 80/60 mg. No dizziness. No chest pain. She states she is more tearful now and doesn't sleep well. Was recently started on trazodone.d   Current Outpatient Prescriptions on File Prior to Visit  Medication Sig Dispense Refill  . acetaminophen (TYLENOL) 325 MG tablet Take 2 tablets (650 mg total) by mouth every 6 (six) hours as needed for mild pain (or Fever >/= 101).      Marland Kitchen amiodarone (PACERONE) 200 MG tablet Take 100 mg by mouth daily.      Marland Kitchen atorvastatin (LIPITOR) 40 MG tablet Take 40 mg by mouth every evening.       . diltiazem (CARDIZEM CD) 240 MG 24 hr capsule Take 1 capsule (240 mg total) by mouth daily.  30 capsule  2  . donepezil (ARICEPT) 10 MG tablet Take 10 mg by mouth at bedtime.      Marland Kitchen esomeprazole (NEXIUM) 40 MG capsule Take 40 mg by mouth daily before breakfast.        . ezetimibe (ZETIA) 10 MG tablet Take 10 mg by mouth every evening.       . furosemide  (LASIX) 20 MG tablet Take by mouth 2 (two) times daily. 80 mg in the am and 60 mg in the pm      . insulin glargine (LANTUS) 100 UNIT/ML injection Inject 50 Units into the skin 2 (two) times daily.       . insulin lispro (HUMALOG) 100 UNIT/ML injection Inject 20-25 Units into the skin 2 (two) times daily as needed for high blood sugar. Home sliding scale If blood sugar is <200 take 20 units If blood sugar is >200 take 25 units If blood sugar is less than 70, don't take any      . levothyroxine (SYNTHROID, LEVOTHROID) 100 MCG tablet Take 100 mcg by mouth every morning.       . metoprolol succinate (TOPROL-XL) 50 MG 24 hr tablet Take 50 mg by mouth daily. Take with or immediately following a meal.      . Multiple Vitamins-Minerals (ICAPS AREDS FORMULA PO) Take 1 tablet by mouth 2 (two) times daily.      . nitroGLYCERIN (NITROSTAT) 0.4 MG SL tablet Place 0.4 mg under the tongue every 5 (five) minutes as needed. For chest pain      . nortriptyline (PAMELOR) 75 MG capsule Take 1 capsule (75 mg total) by mouth at bedtime.      Marland Kitchen  potassium chloride SA (K-DUR,KLOR-CON) 20 MEQ tablet Take 2 tablets (40 mEq total) by mouth 2 (two) times daily.  60 tablet  11  . [DISCONTINUED] enoxaparin (LOVENOX) 100 MG/ML SOLN Inject into the skin every 12 (twelve) hours.        . [DISCONTINUED] simvastatin (ZOCOR) 80 MG tablet Take 80 mg by mouth at bedtime.         No current facility-administered medications on file prior to visit.    Allergies  Allergen Reactions  . Penicillins Swelling  . Betadine [Povidone Iodine] Itching  . Capoten [Captopril] Other (See Comments)    unknown  . Codone [Hydrocodone] Nausea And Vomiting  . Methadone Nausea And Vomiting  . Propoxyphene Other (See Comments)    Intolerance to darvocet    Past Medical History  Diagnosis Date  . Coronary artery disease     a. Severe two vessel; s/p CABG (LIMA-LAD, SVG-diag, SVG-PDA);  b. 05/2012 NSTEMI in setting of rapid aflutter;  c.  06/2012 Lexiscan cardiolite: small, mild reversible defect in lateral wall->mild ischemia, low-risk->med Rx.  Marland Kitchen Hypertension   . Type II diabetes mellitus   . Hyperlipidemia   . Diabetic neuropathy   . CVA (cerebral vascular accident) 2004  . Hypothyroidism   . Chronic anemia   . GERD (gastroesophageal reflux disease)   . Arthritis   . Memory loss, short term   . COPD (chronic obstructive pulmonary disease)   . Blood transfusion without reported diagnosis   . Chronic combined systolic and diastolic CHF (congestive heart failure)     a. 07/2012 Echo: EF 30%, Gr II DD, Mild AS/MR, Mod-Sev TR, mild bi-atrial and RV dil, PASP 79mmHg.  . Moderate mitral regurgitation     a. mod by TEE 2012, mild by echo 2013, 07/2012  . Severe tricuspid regurgitation     a. Severe by TEE 2012, mod by echo 2013, mod-sev by echo 07/2012  . Chronic vulvitis 07/1993  . Idiopathic anemia 2006  . Atrial fibrillation     a. Failed amiodarone (prolonged QT), not a candidate for class IC meds due to her CAD. No Multaq due to CHF hx;  b. 07/2012 s/p SJM Anthem RF Bi-V PPM, ser # [id number] and AV node RFCA;  c. chronic coumadin.  . Atrial flutter   . Pacemaker   . GI bleed     a. 10/2011 - Dr Benson Norway did scope which showed hiatal hernia, colon revealed diverticula (diverticular bleed), internal/ext hemorrhoids.  . GI bleed 02/25/2014    REQUIRING TRANSFUSIONS    Past Surgical History  Procedure Laterality Date  . Coronary artery bypass graft  06/2009    X3, LIMA to LAD, SVG to diagonal, SVG to the posterior descending artery.  . Tonsillectomy    . Knee arthroscopy  08/2001  . Transthoracic echocardiogram  11/12/2010     Ejection fraction felt to be around 50%.  Wall thickness was increased in a pattern of mild LVH.  Moderately dilated left atrium.  Mildly dilated right atrium. Right ventricle was mildly dilated with mildly reduced systolic function  . Cardiac catheterization  06/09/2009    inferior wall hypokinesia with  ejection fraction of 55%.  . Abdominal hysterectomy    . Joint replacement    . Tee without cardioversion  05/23/2011    Procedure: TRANSESOPHAGEAL ECHOCARDIOGRAM (TEE);  Surgeon: Lelon Perla, MD;  Location: [facility];  Service: Cardiovascular;  Laterality: N/A;  . Incision and drainage abscess  05/07/2012    Procedure: INCISION AND  DRAINAGE ABSCESS;  Surgeon: Adin Hector, MD;  Location: [facility];  Service: General;  Laterality: N/A;  Groin wound  . Total abdominal hysterectomy  1979  . Breast biopsy  Left  . Bilateral salpingectomy  10/2000  . Breast biopsy  09/18/2009    fibrocystic change  . Coronary artery bypass graft  06/2009  . Pacemaker insertion  08/2012  . Colonoscopy N/A 01/19/2013    Procedure: COLONOSCOPY;  Surgeon: Inda Castle, MD;  Location: [facility];  Service: Endoscopy;  Laterality: N/A;  . Esophagogastroduodenoscopy N/A 01/19/2013    Procedure: ESOPHAGOGASTRODUODENOSCOPY (EGD);  Surgeon: Inda Castle, MD;  Location: [facility];  Service: Endoscopy;  Laterality: N/A;    History  Smoking status  . Former Smoker  . Types: Cigarettes  Smokeless tobacco  . Never Used    History  Alcohol Use No    Family History  Problem Relation Age of Onset  . Colon cancer Mother   . Stroke Mother   . Hypertension Mother   . Cancer Mother     breast  . Cancer Father     colon  . Heart attack Father     x2  . Renal Disease Sister     dialysis-twin sister  . Renal Disease Sister     renal bypass    Review of Systems: The review of systems is per the HPI.   All other systems were reviewed and are negative.  Physical Exam: BP 140/68  Pulse 72  Ht 5' 3.5" (1.613 m)  Wt 228 lb 12.8 oz (103.783 kg)  BMI 39.89 kg/m2 Patient is  Pleasant obese BF in no acute distress. She is in a wheelchair. She has memory loss. Skin is warm and dry. Color is normal.  HEENT is unremarkable. Normocephalic/atraumatic. PERRL. Sclera are nonicteric. Neck is supple. No masses.   JVD to 8 cm. Lungs mild basilar rales on the right. Cardiac exam shows a regular rate and rhythm. There is a grade 2/6 blowing systolic murmur heard at the left sternal border.. Abdomen is soft. Extremities reveal 1+ edema  with chronic skin thickening. Gait and ROM are intact. No gross neurologic deficits noted.  LABORATORY DATA:  Lab Results  Component Value Date   WBC 5.8 03/21/2014   HGB 10.1* 03/21/2014   HCT 32.1* 03/21/2014   PLT 157 03/21/2014   GLUCOSE 137 03/21/2014   CHOL 160 05/26/2012   TRIG 80 05/26/2012   HDL 29* 05/26/2012   LDLCALC 115* 05/26/2012   ALT 16 02/25/2014   AST 27 02/25/2014   NA 138 03/21/2014   K 3.9 03/21/2014   CL 99 02/27/2014   CREATININE 2.1* 03/21/2014   BUN 40.9* 03/21/2014   CO2 26 03/21/2014   TSH 0.653 01/17/2014   INR 2.70* 02/27/2014   HGBA1C 8.0* 01/16/2013    Ecg: atrial fibrillation with rate 72 bpm and biventricular pacing.   Assessment / Plan:  1. Acute on Chronic systolic and diastolic heart failure. She actually looks pretty good today. Weight is up but her swelling looks better than before. Will continue current diuretic dose. She is severely deconditioned.   2. S/p BiV pacemaker implant with AV node ablation. 99% biV paced.  3. Atrial fib -  now s/p AV node ablation. Now on amiodarone to see if this will reduce her time in AFib. Will assess when seen in device clinic. If no improvement would favor stopping amiodarone.   4. Chronic coumadin therapy. Coumadin held with GI bleed in  August. Still having some mild bleeding. Hgb followed closely by Dr. Virgina Jock. Will need to consider resuming coumadin given high CHAD-Vasc score but she is also at high bleeding risk.   5. Memory loss.  6. CAD status post CABG.   7. Anemia- chronic. Followed by hematology. On Aranesp periodically  8. CKD stage 3  I will follow up in 3 months.

## 2014-04-02 ENCOUNTER — Ambulatory Visit (INDEPENDENT_AMBULATORY_CARE_PROVIDER_SITE_OTHER): Payer: Medicare Other | Admitting: Pharmacist Clinician (PhC)/ Clinical Pharmacy Specialist

## 2014-04-02 DIAGNOSIS — I4891 Unspecified atrial fibrillation: Secondary | ICD-10-CM

## 2014-04-02 DIAGNOSIS — Z5181 Encounter for therapeutic drug level monitoring: Secondary | ICD-10-CM

## 2014-04-02 LAB — POCT INR: INR: 2

## 2014-04-03 ENCOUNTER — Other Ambulatory Visit (HOSPITAL_BASED_OUTPATIENT_CLINIC_OR_DEPARTMENT_OTHER): Payer: Medicare Other

## 2014-04-03 ENCOUNTER — Ambulatory Visit (HOSPITAL_BASED_OUTPATIENT_CLINIC_OR_DEPARTMENT_OTHER): Payer: Medicare Other

## 2014-04-03 VITALS — BP 140/59 | HR 74 | Temp 97.9°F

## 2014-04-03 DIAGNOSIS — D631 Anemia in chronic kidney disease: Secondary | ICD-10-CM | POA: Diagnosis not present

## 2014-04-03 DIAGNOSIS — N183 Chronic kidney disease, stage 3 (moderate): Secondary | ICD-10-CM

## 2014-04-03 DIAGNOSIS — N189 Chronic kidney disease, unspecified: Principal | ICD-10-CM

## 2014-04-03 DIAGNOSIS — D649 Anemia, unspecified: Secondary | ICD-10-CM

## 2014-04-03 LAB — CBC WITH DIFFERENTIAL/PLATELET
BASO%: 0.4 % (ref 0.0–2.0)
Basophils Absolute: 0 10*3/uL (ref 0.0–0.1)
EOS%: 2.1 % (ref 0.0–7.0)
Eosinophils Absolute: 0.2 10*3/uL (ref 0.0–0.5)
HEMATOCRIT: 29.6 % — AB (ref 34.8–46.6)
HGB: 9.2 g/dL — ABNORMAL LOW (ref 11.6–15.9)
LYMPH%: 17.1 % (ref 14.0–49.7)
MCH: 28 pg (ref 25.1–34.0)
MCHC: 31.1 g/dL — AB (ref 31.5–36.0)
MCV: 90 fL (ref 79.5–101.0)
MONO#: 1.6 10*3/uL — ABNORMAL HIGH (ref 0.1–0.9)
MONO%: 22.1 % — AB (ref 0.0–14.0)
NEUT#: 4.2 10*3/uL (ref 1.5–6.5)
NEUT%: 58.3 % (ref 38.4–76.8)
PLATELETS: 173 10*3/uL (ref 145–400)
RBC: 3.29 10*6/uL — AB (ref 3.70–5.45)
RDW: 17.5 % — ABNORMAL HIGH (ref 11.2–14.5)
WBC: 7.2 10*3/uL (ref 3.9–10.3)
lymph#: 1.2 10*3/uL (ref 0.9–3.3)

## 2014-04-03 LAB — TECHNOLOGIST REVIEW

## 2014-04-03 MED ORDER — DARBEPOETIN ALFA-POLYSORBATE 200 MCG/0.4ML IJ SOLN
200.0000 ug | Freq: Once | INTRAMUSCULAR | Status: AC
  Administered 2014-04-03: 200 ug via SUBCUTANEOUS
  Filled 2014-04-03: qty 0.4

## 2014-04-11 ENCOUNTER — Ambulatory Visit (INDEPENDENT_AMBULATORY_CARE_PROVIDER_SITE_OTHER): Payer: Medicare Other | Admitting: Pharmacist Clinician (PhC)/ Clinical Pharmacy Specialist

## 2014-04-11 DIAGNOSIS — I4891 Unspecified atrial fibrillation: Secondary | ICD-10-CM

## 2014-04-11 DIAGNOSIS — Z7901 Long term (current) use of anticoagulants: Secondary | ICD-10-CM

## 2014-04-11 DIAGNOSIS — Z5181 Encounter for therapeutic drug level monitoring: Secondary | ICD-10-CM

## 2014-04-11 LAB — POCT INR: INR: 4.7

## 2014-04-11 NOTE — Progress Notes (Signed)
Pt c/o SOB in office today, first stating worse than previous, then later stating about the same.  Checks weight daily, couldn't recall if has increased over past week or not.  Offered for her to wait to see Cecilie Kicks, told her wait would be approx 30 minutes.  Pt declined.  I strongly encouraged her to go home and take her mid-day furosemide dose.  Also stressed that if she feels worsening SOB, tightness in chest or other concerning symptoms she would need to go to ER over weekend.  Also advised that she consider making appt with PA/NP for next week, especially if weight has increased.  Pt to see me in 1 week - was told to bring record of her daily weights.

## 2014-04-14 ENCOUNTER — Telehealth: Payer: Self-pay | Admitting: Internal Medicine

## 2014-04-14 NOTE — Telephone Encounter (Signed)
Remote transmission is scheduled for Thurs.  Nothing to do

## 2014-04-14 NOTE — Telephone Encounter (Signed)
New Message    Husband calling, wants to know the date and time for wife's upcoming procedure, husband requesting same day call back.

## 2014-04-17 ENCOUNTER — Ambulatory Visit (INDEPENDENT_AMBULATORY_CARE_PROVIDER_SITE_OTHER): Payer: Medicare Other | Admitting: *Deleted

## 2014-04-17 ENCOUNTER — Other Ambulatory Visit (HOSPITAL_BASED_OUTPATIENT_CLINIC_OR_DEPARTMENT_OTHER): Payer: Medicare Other

## 2014-04-17 ENCOUNTER — Ambulatory Visit (HOSPITAL_BASED_OUTPATIENT_CLINIC_OR_DEPARTMENT_OTHER): Payer: Medicare Other

## 2014-04-17 VITALS — BP 139/53 | HR 70 | Temp 97.4°F

## 2014-04-17 DIAGNOSIS — D649 Anemia, unspecified: Secondary | ICD-10-CM

## 2014-04-17 DIAGNOSIS — D631 Anemia in chronic kidney disease: Secondary | ICD-10-CM

## 2014-04-17 DIAGNOSIS — I480 Paroxysmal atrial fibrillation: Secondary | ICD-10-CM

## 2014-04-17 DIAGNOSIS — N189 Chronic kidney disease, unspecified: Secondary | ICD-10-CM

## 2014-04-17 LAB — CBC WITH DIFFERENTIAL/PLATELET
BASO%: 0.3 % (ref 0.0–2.0)
Basophils Absolute: 0 10*3/uL (ref 0.0–0.1)
EOS ABS: 0.3 10*3/uL (ref 0.0–0.5)
EOS%: 3.5 % (ref 0.0–7.0)
HCT: 26.6 % — ABNORMAL LOW (ref 34.8–46.6)
HGB: 8.3 g/dL — ABNORMAL LOW (ref 11.6–15.9)
LYMPH%: 17.8 % (ref 14.0–49.7)
MCH: 27.2 pg (ref 25.1–34.0)
MCHC: 31.2 g/dL — ABNORMAL LOW (ref 31.5–36.0)
MCV: 87.2 fL (ref 79.5–101.0)
MONO#: 1.6 10*3/uL — ABNORMAL HIGH (ref 0.1–0.9)
MONO%: 21.1 % — AB (ref 0.0–14.0)
NEUT#: 4.3 10*3/uL (ref 1.5–6.5)
NEUT%: 57.3 % (ref 38.4–76.8)
NRBC: 0 % (ref 0–0)
PLATELETS: 198 10*3/uL (ref 145–400)
RBC: 3.05 10*6/uL — AB (ref 3.70–5.45)
RDW: 17.2 % — AB (ref 11.2–14.5)
WBC: 7.4 10*3/uL (ref 3.9–10.3)
lymph#: 1.3 10*3/uL (ref 0.9–3.3)

## 2014-04-17 LAB — MDC_IDC_ENUM_SESS_TYPE_REMOTE
Brady Statistic RV Percent Paced: 99 %
Lead Channel Impedance Value: 390 Ohm
Lead Channel Impedance Value: 460 Ohm
Lead Channel Impedance Value: 640 Ohm
Lead Channel Setting Pacing Amplitude: 2 V
Lead Channel Setting Pacing Amplitude: 2.5 V
Lead Channel Setting Pacing Amplitude: 2.75 V
Lead Channel Setting Pacing Pulse Width: 0.4 ms
Lead Channel Setting Pacing Pulse Width: 1 ms
Lead Channel Setting Sensing Sensitivity: 1.5 mV
MDC IDC MSMT LEADCHNL RA SENSING INTR AMPL: 0.5 mV
MDC IDC PG SERIAL: 2853392
MDC IDC STAT BRADY RA PERCENT PACED: 95 %

## 2014-04-17 MED ORDER — DARBEPOETIN ALFA-POLYSORBATE 200 MCG/0.4ML IJ SOLN
200.0000 ug | Freq: Once | INTRAMUSCULAR | Status: AC
  Administered 2014-04-17: 200 ug via SUBCUTANEOUS
  Filled 2014-04-17: qty 0.4

## 2014-04-17 NOTE — Patient Instructions (Signed)
Darbepoetin Alfa injection What is this medicine? DARBEPOETIN ALFA (dar be POE e tin AL fa) helps your body make more red blood cells. It is used to treat anemia caused by chronic kidney failure and chemotherapy. This medicine may be used for other purposes; ask your health care provider or pharmacist if you have questions. COMMON BRAND NAME(S): Aranesp What should I tell my health care provider before I take this medicine? They need to know if you have any of these conditions: -blood clotting disorders or history of blood clots -cancer patient not on chemotherapy -cystic fibrosis -heart disease, such as angina, heart failure, or a history of a heart attack -hemoglobin level of 12 g/dL or greater -high blood pressure -low levels of folate, iron, or vitamin B12 -seizures -an unusual or allergic reaction to darbepoetin, erythropoietin, albumin, hamster proteins, latex, other medicines, foods, dyes, or preservatives -pregnant or trying to get pregnant -breast-feeding How should I use this medicine? This medicine is for injection into a vein or under the skin. It is usually given by a health care professional in a hospital or clinic setting. If you get this medicine at home, you will be taught how to prepare and give this medicine. Do not shake the solution before you withdraw a dose. Use exactly as directed. Take your medicine at regular intervals. Do not take your medicine more often than directed. It is important that you put your used needles and syringes in a special sharps container. Do not put them in a trash can. If you do not have a sharps container, call your pharmacist or healthcare provider to get one. Talk to your pediatrician regarding the use of this medicine in children. While this medicine may be used in children as young as 1 year for selected conditions, precautions do apply. Overdosage: If you think you have taken too much of this medicine contact a poison control center or  emergency room at once. NOTE: This medicine is only for you. Do not share this medicine with others. What if I miss a dose? If you miss a dose, take it as soon as you can. If it is almost time for your next dose, take only that dose. Do not take double or extra doses. What may interact with this medicine? Do not take this medicine with any of the following medications: -epoetin alfa This list may not describe all possible interactions. Give your health care provider a list of all the medicines, herbs, non-prescription drugs, or dietary supplements you use. Also tell them if you smoke, drink alcohol, or use illegal drugs. Some items may interact with your medicine. What should I watch for while using this medicine? Visit your prescriber or health care professional for regular checks on your progress and for the needed blood tests and blood pressure measurements. It is especially important for the doctor to make sure your hemoglobin level is in the desired range, to limit the risk of potential side effects and to give you the best benefit. Keep all appointments for any recommended tests. Check your blood pressure as directed. Ask your doctor what your blood pressure should be and when you should contact him or her. As your body makes more red blood cells, you may need to take iron, folic acid, or vitamin B supplements. Ask your doctor or health care provider which products are right for you. If you have kidney disease continue dietary restrictions, even though this medication can make you feel better. Talk with your doctor or health   care professional about the foods you eat and the vitamins that you take. What side effects may I notice from receiving this medicine? Side effects that you should report to your doctor or health care professional as soon as possible: -allergic reactions like skin rash, itching or hives, swelling of the face, lips, or tongue -breathing problems -changes in vision -chest  pain -confusion, trouble speaking or understanding -feeling faint or lightheaded, falls -high blood pressure -muscle aches or pains -pain, swelling, warmth in the leg -rapid weight gain -severe headaches -sudden numbness or weakness of the face, arm or leg -trouble walking, dizziness, loss of balance or coordination -seizures (convulsions) -swelling of the ankles, feet, hands -unusually weak or tired Side effects that usually do not require medical attention (report to your doctor or health care professional if they continue or are bothersome): -diarrhea -fever, chills (flu-like symptoms) -headaches -nausea, vomiting -redness, stinging, or swelling at site where injected This list may not describe all possible side effects. Call your doctor for medical advice about side effects. You may report side effects to FDA at 1-800-FDA-1088. Where should I keep my medicine? Keep out of the reach of children. Store in a refrigerator between 2 and 8 degrees C (36 and 46 degrees F). Do not freeze. Do not shake. Throw away any unused portion if using a single-dose vial. Throw away any unused medicine after the expiration date. NOTE: This sheet is a summary. It may not cover all possible information. If you have questions about this medicine, talk to your doctor, pharmacist, or health care provider.  2015, [employer]vier/[employer] Standard. (2008-06-03 10:23:57)  

## 2014-04-18 ENCOUNTER — Ambulatory Visit (INDEPENDENT_AMBULATORY_CARE_PROVIDER_SITE_OTHER): Payer: Medicare Other | Admitting: Pharmacist Clinician (PhC)/ Clinical Pharmacy Specialist

## 2014-04-18 ENCOUNTER — Ambulatory Visit: Payer: 59 | Admitting: Obstetrics & Gynecology

## 2014-04-18 DIAGNOSIS — Z5181 Encounter for therapeutic drug level monitoring: Secondary | ICD-10-CM

## 2014-04-18 DIAGNOSIS — Z7901 Long term (current) use of anticoagulants: Secondary | ICD-10-CM

## 2014-04-18 DIAGNOSIS — I4891 Unspecified atrial fibrillation: Secondary | ICD-10-CM

## 2014-04-18 LAB — POCT INR: INR: 1.8

## 2014-04-18 NOTE — Progress Notes (Signed)
Remote pacemaker transmission.   

## 2014-04-24 ENCOUNTER — Encounter: Payer: Self-pay | Admitting: Obstetrics & Gynecology

## 2014-04-24 ENCOUNTER — Ambulatory Visit (INDEPENDENT_AMBULATORY_CARE_PROVIDER_SITE_OTHER): Payer: 59 | Admitting: Obstetrics & Gynecology

## 2014-04-24 VITALS — BP 116/60 | HR 68

## 2014-04-24 DIAGNOSIS — Z01419 Encounter for gynecological examination (general) (routine) without abnormal findings: Secondary | ICD-10-CM

## 2014-04-24 DIAGNOSIS — R160 Hepatomegaly, not elsewhere classified: Secondary | ICD-10-CM

## 2014-04-24 NOTE — Progress Notes (Signed)
Patient is scheduled for ultrasound of abdomen complete.  Dx Hepatome[facility] at [address], [place] [address].  Husband of patient given written appointment information.  Scheduled for 05/02/14 at 0900. Patient to be NPO after midnight, husband instructed. Written appointment information on appointment reminder card as well.

## 2014-04-24 NOTE — Progress Notes (Signed)
80 y.o. [id number] MarriedAfrican AmericanF here for annual exam.  Having increased mobility issues and issues with memory.  Husband brought her today.  Reports some rectal bleeding.  H/O hemorrhoids.  No LMP recorded. Patient has had a hysterectomy.          Sexually active: No.  The current method of family planning is status post hysterectomy.    Exercising: No.  The patient does not participate in regular exercise at present. Smoker:  no  Health Maintenance: Pap:  02/10/2006 History of abnormal Pap:  no MMG:  10/15/13 Bi-Rads Benign Colonoscopy:  2009, per pt WNL BMD:   2009 TDaP:  3/10 Screening Labs: pcp, Hb today: pcp, Urine today: pcp   reports that she has quit smoking. Her smoking use included Cigarettes. She smoked 0.00 packs per day. She has never used smokeless tobacco. She reports that she does not drink alcohol or use illicit drugs.  Past Medical History  Diagnosis Date  . Coronary artery disease     a. Severe two vessel; s/p CABG (LIMA-LAD, SVG-diag, SVG-PDA);  b. 05/2012 NSTEMI in setting of rapid aflutter;  c. 06/2012 Lexiscan cardiolite: small, mild reversible defect in lateral wall->mild ischemia, low-risk->med Rx.  Marland Kitchen Hypertension   . Type II diabetes mellitus   . Hyperlipidemia   . Diabetic neuropathy   . CVA (cerebral vascular accident) 2004  . Hypothyroidism   . Chronic anemia   . GERD (gastroesophageal reflux disease)   . Arthritis   . Memory loss, short term   . COPD (chronic obstructive pulmonary disease)   . Blood transfusion without reported diagnosis   . Chronic combined systolic and diastolic CHF (congestive heart failure)     a. 07/2012 Echo: EF 30%, Gr II DD, Mild AS/MR, Mod-Sev TR, mild bi-atrial and RV dil, PASP 38mmHg.  . Moderate mitral regurgitation     a. mod by TEE 2012, mild by echo 2013, 07/2012  . Severe tricuspid regurgitation     a. Severe by TEE 2012, mod by echo 2013, mod-sev by echo 07/2012  . Chronic vulvitis 07/1993  . Idiopathic  anemia 2006  . Atrial fibrillation     a. Failed amiodarone (prolonged QT), not a candidate for class IC meds due to her CAD. No Multaq due to CHF hx;  b. 07/2012 s/p SJM Anthem RF Bi-V PPM, ser # [id number] and AV node RFCA;  c. chronic coumadin.  . Atrial flutter   . Pacemaker   . GI bleed     a. 10/2011 - Dr Benson Norway did scope which showed hiatal hernia, colon revealed diverticula (diverticular bleed), internal/ext hemorrhoids.  . GI bleed 02/25/2014    REQUIRING TRANSFUSIONS    Past Surgical History  Procedure Laterality Date  . Coronary artery bypass graft  06/2009    X3, LIMA to LAD, SVG to diagonal, SVG to the posterior descending artery.  . Tonsillectomy    . Knee arthroscopy  08/2001  . Transthoracic echocardiogram  11/12/2010     Ejection fraction felt to be around 50%.  Wall thickness was increased in a pattern of mild LVH.  Moderately dilated left atrium.  Mildly dilated right atrium. Right ventricle was mildly dilated with mildly reduced systolic function  . Cardiac catheterization  06/09/2009    inferior wall hypokinesia with ejection fraction of 55%.  . Abdominal hysterectomy    . Joint replacement    . Tee without cardioversion  05/23/2011    Procedure: TRANSESOPHAGEAL ECHOCARDIOGRAM (TEE);  Surgeon: Lelon Perla, MD;  Location: [facility];  Service: Cardiovascular;  Laterality: N/A;  . Incision and drainage abscess  05/07/2012    Procedure: INCISION AND DRAINAGE ABSCESS;  Surgeon: Adin Hector, MD;  Location: [facility];  Service: General;  Laterality: N/A;  Groin wound  . Total abdominal hysterectomy  1979  . Breast biopsy  Left  . Bilateral salpingectomy  10/2000  . Breast biopsy  09/18/2009    fibrocystic change  . Coronary artery bypass graft  06/2009  . Pacemaker insertion  08/2012  . Colonoscopy N/A 01/19/2013    Procedure: COLONOSCOPY;  Surgeon: Inda Castle, MD;  Location: [facility];  Service: Endoscopy;  Laterality: N/A;  . Esophagogastroduodenoscopy N/A  01/19/2013    Procedure: ESOPHAGOGASTRODUODENOSCOPY (EGD);  Surgeon: Inda Castle, MD;  Location: [facility];  Service: Endoscopy;  Laterality: N/A;    Current Outpatient Prescriptions  Medication Sig Dispense Refill  . amiodarone (PACERONE) 200 MG tablet Take 100 mg by mouth daily.      Marland Kitchen atorvastatin (LIPITOR) 40 MG tablet Take 40 mg by mouth every evening.       . diltiazem (CARDIZEM CD) 240 MG 24 hr capsule Take 1 capsule (240 mg total) by mouth daily.  30 capsule  2  . donepezil (ARICEPT) 10 MG tablet Take 10 mg by mouth at bedtime.      Marland Kitchen esomeprazole (NEXIUM) 40 MG capsule Take 40 mg by mouth daily before breakfast.        . ezetimibe (ZETIA) 10 MG tablet Take 10 mg by mouth every evening.       . furosemide (LASIX) 20 MG tablet Take by mouth 2 (two) times daily. 80 mg in the am and 60 mg in the pm      . insulin glargine (LANTUS) 100 UNIT/ML injection Inject 50 Units into the skin 2 (two) times daily.       . insulin lispro (HUMALOG) 100 UNIT/ML injection Inject 20-25 Units into the skin 2 (two) times daily as needed for high blood sugar. Home sliding scale If blood sugar is <200 take 20 units If blood sugar is >200 take 25 units If blood sugar is less than 70, don't take any      . levothyroxine (SYNTHROID, LEVOTHROID) 100 MCG tablet Take 100 mcg by mouth every morning.       . metoprolol succinate (TOPROL-XL) 50 MG 24 hr tablet Take 50 mg by mouth daily. Take with or immediately following a meal.      . nitroGLYCERIN (NITROSTAT) 0.4 MG SL tablet Place 0.4 mg under the tongue every 5 (five) minutes as needed. For chest pain      . nortriptyline (PAMELOR) 75 MG capsule Take 1 capsule (75 mg total) by mouth at bedtime.      . potassium chloride SA (K-DUR,KLOR-CON) 20 MEQ tablet Take 2 tablets (40 mEq total) by mouth 2 (two) times daily.  60 tablet  11  . traZODone (DESYREL) 100 MG tablet Take 100 mg by mouth at bedtime.      Marland Kitchen acetaminophen (TYLENOL) 325 MG tablet Take 2 tablets  (650 mg total) by mouth every 6 (six) hours as needed for mild pain (or Fever >/= 101).      . Multiple Vitamins-Minerals (ICAPS AREDS FORMULA PO) Take 1 tablet by mouth 2 (two) times daily.      . [DISCONTINUED] enoxaparin (LOVENOX) 100 MG/ML SOLN Inject into the skin every 12 (twelve) hours.        . [DISCONTINUED] simvastatin (ZOCOR)  80 MG tablet Take 80 mg by mouth at bedtime.         No current facility-administered medications for this visit.    Family History  Problem Relation Age of Onset  . Colon cancer Mother   . Stroke Mother   . Hypertension Mother   . Cancer Mother     breast  . Cancer Father     colon  . Heart attack Father     x2  . Renal Disease Sister     dialysis-twin sister  . Renal Disease Sister     renal bypass    ROS:  Pertinent items are noted in HPI.  Otherwise, a comprehensive ROS was negative.  Exam:        Ht Readings from Last 3 Encounters:  04/01/14 5' 3.5" (1.613 m)  03/21/14 5\' 3"  (1.6 m)  02/26/14 5\' 3"  (1.6 m)    General appearance: alert, cooperative and appears stated age Head: Normocephalic, without obvious abnormality, atraumatic Neck: no adenopathy, supple, symmetrical, trachea midline and thyroid normal to inspection and palpation Lungs: clear to auscultation bilaterally Breasts: normal appearance, no masses or tenderness Heart: regular rate and rhythm Abdomen: soft, non-tender; bowel sounds normal; mass felt below costal margin on right Extremities: extremities normal, atraumatic, no cyanosis or edema Skin: Skin color, texture, turgor normal. No rashes or lesions Lymph nodes: Cervical, supraclavicular, and axillary nodes normal. No abnormal inguinal nodes palpated Neurologic: Grossly normal   Pelvic: External genitalia:  no lesions              Urethra:  normal appearing urethra with no masses, tenderness or lesions              Bartholins and Skenes: normal                 Vagina: normal appearing vagina with normal color  and discharge, no lesions              Cervix: absent              Pap taken: No. Bimanual Exam:  Uterus:  uterus absent              Adnexa: no mass, fullness, tenderness               Rectovaginal: Confirms, hemorrhoids noted, no evidence of blood               Anus:  normal sphincter tone, no lesions  A:  Well Woman with normal exam PMP, no HRT Enlarged liver/mass on right A fib on long term anticoagulation Diabetes Hyperlipidemia H/O CVA H/O severe diverticulosis with last colonoscopy 9/14  P:   Mammogram every other year pap smear not indicated Plan abdominal ultrasound to assess for RUQ mass/hepatomegaly return annually or prn  An After Visit Summary was printed and given to the patient.

## 2014-04-25 ENCOUNTER — Ambulatory Visit (INDEPENDENT_AMBULATORY_CARE_PROVIDER_SITE_OTHER): Payer: Medicare Other | Admitting: Pharmacist Clinician (PhC)/ Clinical Pharmacy Specialist

## 2014-04-25 DIAGNOSIS — I480 Paroxysmal atrial fibrillation: Secondary | ICD-10-CM

## 2014-04-25 DIAGNOSIS — Z7901 Long term (current) use of anticoagulants: Secondary | ICD-10-CM

## 2014-04-25 DIAGNOSIS — Z5181 Encounter for therapeutic drug level monitoring: Secondary | ICD-10-CM

## 2014-04-25 DIAGNOSIS — I4891 Unspecified atrial fibrillation: Secondary | ICD-10-CM

## 2014-04-25 LAB — POCT INR: INR: 2.3

## 2014-04-30 ENCOUNTER — Telehealth: Payer: Self-pay | Admitting: Hematology

## 2014-04-30 ENCOUNTER — Telehealth: Payer: Self-pay

## 2014-04-30 ENCOUNTER — Ambulatory Visit (HOSPITAL_COMMUNITY)
Admission: RE | Admit: 2014-04-30 | Discharge: 2014-04-30 | Disposition: A | Discharge status: Home / Self Care | Payer: Medicare Other | Source: Ambulatory Visit | Attending: Internal Medicine | Admitting: Internal Medicine

## 2014-04-30 ENCOUNTER — Other Ambulatory Visit (HOSPITAL_COMMUNITY): Payer: Self-pay | Admitting: *Deleted

## 2014-04-30 DIAGNOSIS — D649 Anemia, unspecified: Secondary | ICD-10-CM | POA: Insufficient documentation

## 2014-04-30 LAB — PREPARE RBC (CROSSMATCH)

## 2014-04-30 MED ORDER — FUROSEMIDE 10 MG/ML IJ SOLN
40.0000 mg | Freq: Once | INTRAMUSCULAR | Status: AC
  Administered 2014-04-30: 40 mg via INTRAVENOUS

## 2014-04-30 MED ORDER — FUROSEMIDE 10 MG/ML IJ SOLN
INTRAMUSCULAR | Status: AC
  Filled 2014-04-30: qty 4

## 2014-04-30 MED ORDER — FUROSEMIDE 10 MG/ML IJ SOLN
INTRAMUSCULAR | Status: AC
  Administered 2014-04-30: 40 mg via INTRAVENOUS
  Filled 2014-04-30: qty 4

## 2014-04-30 MED ORDER — SODIUM CHLORIDE 0.9 % IV SOLN
Freq: Once | INTRAVENOUS | Status: AC
  Administered 2014-04-30: 10:00:00 via INTRAVENOUS

## 2014-04-30 NOTE — Telephone Encounter (Signed)
added appt per pof....per pof pt aware °

## 2014-04-30 NOTE — Telephone Encounter (Signed)
lvm returning husband's call. Pt is receiving blood today 10/28 at [facility]. She has an appt 10/29 at [facility]. Dr Virgina Jock was apparently supposed to call [facility] and cancel her appt. Does she want to postpone or keep appt 10/29? 0920 s/w husband and r/s to0 11/2 at 11am. POF to scheduler

## 2014-05-01 ENCOUNTER — Ambulatory Visit: Payer: Medicare Other

## 2014-05-01 ENCOUNTER — Other Ambulatory Visit: Payer: Medicare Other

## 2014-05-01 LAB — TYPE AND SCREEN
ABO/RH(D): A POS
Antibody Screen: NEGATIVE
DONOR AG TYPE: NEGATIVE
Donor AG Type: NEGATIVE
Unit division: 0
Unit division: 0

## 2014-05-02 ENCOUNTER — Telehealth: Payer: Self-pay

## 2014-05-02 ENCOUNTER — Other Ambulatory Visit: Payer: Medicare Other

## 2014-05-02 ENCOUNTER — Telehealth: Payer: Self-pay | Admitting: Obstetrics & Gynecology

## 2014-05-02 ENCOUNTER — Ambulatory Visit
Admission: RE | Admit: 2014-05-02 | Discharge: 2014-05-02 | Disposition: A | Discharge status: Home / Self Care | Payer: Medicare Other | Source: Ambulatory Visit | Attending: Obstetrics & Gynecology | Admitting: Obstetrics & Gynecology

## 2014-05-02 DIAGNOSIS — R160 Hepatomegaly, not elsewhere classified: Secondary | ICD-10-CM

## 2014-05-02 NOTE — Telephone Encounter (Signed)
Message copied by Jasmine Awe on Fri May 02, 2014 10:08 AM ------      Message from: Megan Salon      Created: Fri May 02, 2014 10:06 AM       Inform pt's spouse of enlarged liver that looks like "fatty liver" but no focal findings (meaning nothing worrisome for cancer).  Also, she has gall stones and evidence possible inflammation of the gallbladder.  Just needs to see Dr. Deatra Ina.  Results released to [place]. ------

## 2014-05-02 NOTE — Telephone Encounter (Signed)
Spoke with patient's spouse Jenny Reichmann. Advised of results and message as seen below from [place]. Spouse having a hard time hearing and understanding. Asks if I will speak with daughter. Spoke with daughter. Results and message from [place] given. She is agreeable and verbalizes understanding. Offered to call and schedule appointment with Dr.Kaplan for follow up for patient. Daughter declines stating that she will have them look at their schedules with other appointments and call to schedule an appointment to see Dr.Kaplan.  Routing to provider for final review. Patient agreeable to disposition. Will close encounter

## 2014-05-02 NOTE — Telephone Encounter (Signed)
Pt is going to stay with Dr. Stana Bunting so please send the ultrasound to Dr. Stana Bunting

## 2014-05-02 NOTE — Telephone Encounter (Signed)
Spoke with Cassandra at time of incoming call to give report on patient's ultrasound complete. "Appearance of gallbladder raises questions of degree of cholecystitis. Enlarged liver and slight ascites."  Results are viewable in EPIC. Vito Backers has faxed results to our office as well. Printed order for review by Dr.Miller.

## 2014-05-05 ENCOUNTER — Encounter: Payer: Self-pay | Admitting: Hematology

## 2014-05-05 ENCOUNTER — Other Ambulatory Visit (HOSPITAL_BASED_OUTPATIENT_CLINIC_OR_DEPARTMENT_OTHER): Payer: Medicare Other

## 2014-05-05 ENCOUNTER — Ambulatory Visit (HOSPITAL_COMMUNITY)
Admission: RE | Admit: 2014-05-05 | Discharge: 2014-05-05 | Disposition: A | Discharge status: Home / Self Care | Payer: Medicare Other | Source: Ambulatory Visit | Attending: Hematology | Admitting: Hematology

## 2014-05-05 ENCOUNTER — Ambulatory Visit (HOSPITAL_BASED_OUTPATIENT_CLINIC_OR_DEPARTMENT_OTHER): Payer: Medicare Other | Admitting: Hematology

## 2014-05-05 ENCOUNTER — Telehealth: Payer: Self-pay | Admitting: Hematology

## 2014-05-05 ENCOUNTER — Telehealth: Payer: Self-pay | Admitting: *Deleted

## 2014-05-05 ENCOUNTER — Ambulatory Visit (HOSPITAL_BASED_OUTPATIENT_CLINIC_OR_DEPARTMENT_OTHER): Payer: Medicare Other

## 2014-05-05 VITALS — BP 123/58 | HR 76 | Temp 98.9°F | Resp 20 | Ht 63.5 in | Wt 235.9 lb

## 2014-05-05 DIAGNOSIS — N189 Chronic kidney disease, unspecified: Secondary | ICD-10-CM

## 2014-05-05 DIAGNOSIS — D631 Anemia in chronic kidney disease: Secondary | ICD-10-CM

## 2014-05-05 DIAGNOSIS — I4891 Unspecified atrial fibrillation: Secondary | ICD-10-CM | POA: Diagnosis not present

## 2014-05-05 DIAGNOSIS — R0602 Shortness of breath: Secondary | ICD-10-CM | POA: Insufficient documentation

## 2014-05-05 DIAGNOSIS — D472 Monoclonal gammopathy: Secondary | ICD-10-CM | POA: Diagnosis not present

## 2014-05-05 DIAGNOSIS — N183 Chronic kidney disease, stage 3 unspecified: Secondary | ICD-10-CM

## 2014-05-05 DIAGNOSIS — R05 Cough: Secondary | ICD-10-CM

## 2014-05-05 DIAGNOSIS — J40 Bronchitis, not specified as acute or chronic: Secondary | ICD-10-CM

## 2014-05-05 DIAGNOSIS — D649 Anemia, unspecified: Secondary | ICD-10-CM

## 2014-05-05 DIAGNOSIS — R059 Cough, unspecified: Secondary | ICD-10-CM

## 2014-05-05 DIAGNOSIS — K8020 Calculus of gallbladder without cholecystitis without obstruction: Secondary | ICD-10-CM

## 2014-05-05 LAB — CBC WITH DIFFERENTIAL/PLATELET
BASO%: 0.9 % (ref 0.0–2.0)
Basophils Absolute: 0.1 10*3/uL (ref 0.0–0.1)
EOS ABS: 0.4 10*3/uL (ref 0.0–0.5)
EOS%: 7.9 % — ABNORMAL HIGH (ref 0.0–7.0)
HCT: 29.8 % — ABNORMAL LOW (ref 34.8–46.6)
HGB: 9 g/dL — ABNORMAL LOW (ref 11.6–15.9)
LYMPH#: 0.8 10*3/uL — AB (ref 0.9–3.3)
LYMPH%: 15.5 % (ref 14.0–49.7)
MCH: 25.5 pg (ref 25.1–34.0)
MCHC: 30.2 g/dL — AB (ref 31.5–36.0)
MCV: 84.4 fL (ref 79.5–101.0)
MONO#: 1 10*3/uL — AB (ref 0.1–0.9)
MONO%: 18.3 % — ABNORMAL HIGH (ref 0.0–14.0)
NEUT%: 57.4 % (ref 38.4–76.8)
NEUTROS ABS: 3 10*3/uL (ref 1.5–6.5)
PLATELETS: 176 10*3/uL (ref 145–400)
RBC: 3.53 10*6/uL — AB (ref 3.70–5.45)
RDW: 16.7 % — ABNORMAL HIGH (ref 11.2–14.5)
WBC: 5.3 10*3/uL (ref 3.9–10.3)

## 2014-05-05 LAB — COMPREHENSIVE METABOLIC PANEL (CC13)
ALK PHOS: 140 U/L (ref 40–150)
ALT: 20 U/L (ref 0–55)
ANION GAP: 9 meq/L (ref 3–11)
AST: 29 U/L (ref 5–34)
Albumin: 2.8 g/dL — ABNORMAL LOW (ref 3.5–5.0)
BUN: 40.5 mg/dL — AB (ref 7.0–26.0)
CO2: 24 meq/L (ref 22–29)
Calcium: 8.7 mg/dL (ref 8.4–10.4)
Chloride: 104 mEq/L (ref 98–109)
Creatinine: 2.4 mg/dL — ABNORMAL HIGH (ref 0.6–1.1)
GLUCOSE: 179 mg/dL — AB (ref 70–140)
Potassium: 4.4 mEq/L (ref 3.5–5.1)
Sodium: 137 mEq/L (ref 136–145)
TOTAL PROTEIN: 7.4 g/dL (ref 6.4–8.3)
Total Bilirubin: 0.53 mg/dL (ref 0.20–1.20)

## 2014-05-05 MED ORDER — AZITHROMYCIN 250 MG PO TABS: ORAL_TABLET | ORAL | Status: DC

## 2014-05-05 MED ORDER — DARBEPOETIN ALFA 200 MCG/0.4ML IJ SOSY
200.0000 ug | PREFILLED_SYRINGE | Freq: Once | INTRAMUSCULAR | Status: AC
  Administered 2014-05-05: 200 ug via SUBCUTANEOUS
  Filled 2014-05-05: qty 0.4

## 2014-05-05 MED ORDER — IPRATROPIUM-ALBUTEROL 20-100 MCG/ACT IN AERS
1.0000 | INHALATION_SPRAY | Freq: Two times a day (BID) | RESPIRATORY_TRACT | Status: AC
Start: 2014-05-05 — End: ?

## 2014-05-05 NOTE — Patient Instructions (Signed)
Darbepoetin Alfa injection What is this medicine? DARBEPOETIN ALFA (dar be POE e tin AL fa) helps your body make more red blood cells. It is used to treat anemia caused by chronic kidney failure and chemotherapy. This medicine may be used for other purposes; ask your health care provider or pharmacist if you have questions. COMMON BRAND NAME(S): Aranesp What should I tell my health care provider before I take this medicine? They need to know if you have any of these conditions: -blood clotting disorders or history of blood clots -cancer patient not on chemotherapy -cystic fibrosis -heart disease, such as angina, heart failure, or a history of a heart attack -hemoglobin level of 12 g/dL or greater -high blood pressure -low levels of folate, iron, or vitamin B12 -seizures -an unusual or allergic reaction to darbepoetin, erythropoietin, albumin, hamster proteins, latex, other medicines, foods, dyes, or preservatives -pregnant or trying to get pregnant -breast-feeding How should I use this medicine? This medicine is for injection into a vein or under the skin. It is usually given by a health care professional in a hospital or clinic setting. If you get this medicine at home, you will be taught how to prepare and give this medicine. Do not shake the solution before you withdraw a dose. Use exactly as directed. Take your medicine at regular intervals. Do not take your medicine more often than directed. It is important that you put your used needles and syringes in a special sharps container. Do not put them in a trash can. If you do not have a sharps container, call your pharmacist or healthcare provider to get one. Talk to your pediatrician regarding the use of this medicine in children. While this medicine may be used in children as young as 1 year for selected conditions, precautions do apply. Overdosage: If you think you have taken too much of this medicine contact a poison control center or  emergency room at once. NOTE: This medicine is only for you. Do not share this medicine with others. What if I miss a dose? If you miss a dose, take it as soon as you can. If it is almost time for your next dose, take only that dose. Do not take double or extra doses. What may interact with this medicine? Do not take this medicine with any of the following medications: -epoetin alfa This list may not describe all possible interactions. Give your health care provider a list of all the medicines, herbs, non-prescription drugs, or dietary supplements you use. Also tell them if you smoke, drink alcohol, or use illegal drugs. Some items may interact with your medicine. What should I watch for while using this medicine? Visit your prescriber or health care professional for regular checks on your progress and for the needed blood tests and blood pressure measurements. It is especially important for the doctor to make sure your hemoglobin level is in the desired range, to limit the risk of potential side effects and to give you the best benefit. Keep all appointments for any recommended tests. Check your blood pressure as directed. Ask your doctor what your blood pressure should be and when you should contact him or her. As your body makes more red blood cells, you may need to take iron, folic acid, or vitamin B supplements. Ask your doctor or health care provider which products are right for you. If you have kidney disease continue dietary restrictions, even though this medication can make you feel better. Talk with your doctor or health   care professional about the foods you eat and the vitamins that you take. What side effects may I notice from receiving this medicine? Side effects that you should report to your doctor or health care professional as soon as possible: -allergic reactions like skin rash, itching or hives, swelling of the face, lips, or tongue -breathing problems -changes in vision -chest  pain -confusion, trouble speaking or understanding -feeling faint or lightheaded, falls -high blood pressure -muscle aches or pains -pain, swelling, warmth in the leg -rapid weight gain -severe headaches -sudden numbness or weakness of the face, arm or leg -trouble walking, dizziness, loss of balance or coordination -seizures (convulsions) -swelling of the ankles, feet, hands -unusually weak or tired Side effects that usually do not require medical attention (report to your doctor or health care professional if they continue or are bothersome): -diarrhea -fever, chills (flu-like symptoms) -headaches -nausea, vomiting -redness, stinging, or swelling at site where injected This list may not describe all possible side effects. Call your doctor for medical advice about side effects. You may report side effects to FDA at 1-800-FDA-1088. Where should I keep my medicine? Keep out of the reach of children. Store in a refrigerator between 2 and 8 degrees C (36 and 46 degrees F). Do not freeze. Do not shake. Throw away any unused portion if using a single-dose vial. Throw away any unused medicine after the expiration date. NOTE: This sheet is a summary. It may not cover all possible information. If you have questions about this medicine, talk to your doctor, pharmacist, or health care provider.  2015, [employer]vier/[employer] Standard. (2008-06-03 10:23:57)  

## 2014-05-05 NOTE — Telephone Encounter (Signed)
[name] with [facility] called reporting an interaction with azithromycin and Amiodarone.  Can cause increased heart beat and irregular rhythms.  Interaction also with warfarin causing increased bleeding tendency.  Please advise and call [facility] at [phone].  Will notify providers.

## 2014-05-05 NOTE — Telephone Encounter (Signed)
Faxed office visit notes and ultrasound to [facility] at [phone] at Dr. Ulyses Amor office.  Per Dr. Kelby Fam office, patient would like to see Dr. Benson Norway, patient active patient with Dr. Benson Norway.  Faxed records and request that patient be contacted for appointment with Dr. Benson Norway.  Routing to provider for final review. Patient agreeable to disposition. Will close encounter

## 2014-05-05 NOTE — Telephone Encounter (Signed)
Gave avs & cal for Nov. ( No pof/writen order, POF down)

## 2014-05-05 NOTE — Telephone Encounter (Signed)
Received fax confirmation from Dr. Ulyses Amor office. Patient is aware of appointment with Dr. Benson Norway at 05/14/14 at 2:45

## 2014-05-05 NOTE — Progress Notes (Signed)
[facility] OFFICE PROGRESS NOTE  Natasha Reel, MD [place] [address]  DIAGNOSIS: Cough - Plan: DG Chest 2 View  Anemia due to chronic renal failure treated with erythropoietin, stage 3 (moderate) - Plan: CBC with Differential  Chief Complaint  Patient presents with  . Follow-up    CURRENT THERAPY: Aranesp 200 mcg subcutaneous every two weeks for a hemoglobin less than 11.    INTERVAL HISTORY:  [name] 80 y.o. female with a history of IgG lambda MGUS, Anemia associated with chronic renal failure who presents for follow up last seen here on 03/21/2014. Today, she is accompanied by her daughter. She had a recent hospitalization due to the lower GI bleed. She was found to have a supratherapeutic INR 8. She received blood transfusions and her Coumadin was stopped for a brief period of time. She is now back on Coumadin with close monitoring by cardiology. She denies any recent fevers , chest pain, abdominal pain, nausea, and vomiting. She does note a small amount of blood due to her hemorrhoids. She reports dyspnea on exertion on walking short distances as well as edema to her lower extremities. She does have a history of CHF with a left ventricular ejection fraction of approximately 30%. Her primary care provider saw her earlier this week and increased her Lasix to 80 mg in the morning and 60 mg in the evening.  Patient is experiencing some cough and shortness of breath. The cough is associated with wheezing for last 3 days. She also decreased appetite. About a week ago she received 2 units of blood at [facility]. She appears to be in the state of fluid overload as she has some crackles in her lung and I did order a chest x-ray today the following results:   IMPRESSION: 1. Mild interstitial prominence along with cardiomegaly suggest mild chronic congestive heart failure. No evidence of pneumonia.  She'll also had an ultrasound done recently which  showed gallstones and gallbladder sludging although she is asymptomatic from it and will be seen by her GI for that particular problem. Her gastroenterologist is Dr. Benson Norway.patient will continue her Lasix and diuretics for the mild CHF.she'll also continue to get aranesp 200 mcg subcutaneous every 2 weeks. She also sustained a fall recently on the weekend and got some bruising in her upper extremities, there is no focal tenderness suggestive of a fracture.  MEDICAL HISTORY: Past Medical History  Diagnosis Date  . Coronary artery disease     a. Severe two vessel; s/p CABG (LIMA-LAD, SVG-diag, SVG-PDA);  b. 05/2012 NSTEMI in setting of rapid aflutter;  c. 06/2012 Lexiscan cardiolite: small, mild reversible defect in lateral wall->mild ischemia, low-risk->med Rx.  Marland Kitchen Hypertension   . Type II diabetes mellitus   . Hyperlipidemia   . Diabetic neuropathy   . CVA (cerebral vascular accident) 2004  . Hypothyroidism   . Chronic anemia   . GERD (gastroesophageal reflux disease)   . Arthritis   . Memory loss, short term   . COPD (chronic obstructive pulmonary disease)   . Blood transfusion without reported diagnosis   . Chronic combined systolic and diastolic CHF (congestive heart failure)     a. 07/2012 Echo: EF 30%, Gr II DD, Mild AS/MR, Mod-Sev TR, mild bi-atrial and RV dil, PASP 31mmHg.  . Moderate mitral regurgitation     a. mod by TEE 2012, mild by echo 2013, 07/2012  . Severe tricuspid regurgitation     a. Severe by TEE  2012, mod by echo 2013, mod-sev by echo 07/2012  . Chronic vulvitis 07/1993  . Idiopathic anemia 2006  . Atrial fibrillation     a. Failed amiodarone (prolonged QT), not a candidate for class IC meds due to her CAD. No Multaq due to CHF hx;  b. 07/2012 s/p SJM Anthem RF Bi-V PPM, ser # [id number] and AV node RFCA;  c. chronic coumadin.  . Atrial flutter   . Pacemaker   . GI bleed     a. 10/2011 - Dr Benson Norway did scope which showed hiatal hernia, colon revealed diverticula (diverticular  bleed), internal/ext hemorrhoids.  . GI bleed 02/25/2014    REQUIRING TRANSFUSIONS    INTERIM HISTORY: has Atrial fibrillation; Hypothyroidism; Chronic anticoagulation; CAD (coronary artery disease); Diastolic dysfunction; Anemia associated with chronic renal failure; Atrial tachycardia; Right heart failure; MGUS (monoclonal gammopathy of unknown significance); GI bleed; CKD (chronic kidney disease) stage 3, GFR 30-59 ml/min; Abscess of right thigh s/p I&D 21/03/7587; Chronic systolic CHF (congestive heart failure); Atrial flutter; Long term (current) use of anticoagulants; Acute on chronic combined systolic and diastolic congestive heart failure, NYHA class 4; Diabetes mellitus; Chronic anemia; Cardiomyopathy-cause unknown ? rate related v ischemic; Acute respiratory failure; Pulmonary edema; Pleural effusion; Atrial fibrillation with RVR; Physical deconditioning; Fever; Pneumonia; Weakness; Bacteremia due to Escherichia coli; Coronary artery disease; Hx of CABG; Chronic combined systolic and diastolic congestive heart failure; Diabetes mellitus type 2 with complications; Acute on chronic renal failure; Pacemaker; DOE (dyspnea on exertion); Benign neoplasm of stomach; Adenomatous duodenal polyp; Diverticulosis of colon (without mention of hemorrhage); Symptomatic anemia; Encounter for therapeutic drug monitoring; CHF (congestive heart failure); Lower GI bleed; and Over-anticoagulated on her problem list.    ALLERGIES:  is allergic to penicillins; betadine; capoten; codone; methadone; and propoxyphene.  MEDICATIONS: has a current medication list which includes the following prescription(s): acetaminophen, alprazolam, amiodarone, atorvastatin, diltiazem, donepezil, esomeprazole, ezetimibe, furosemide, insulin glargine, insulin lispro, levothyroxine, metoprolol succinate, multiple vitamins-minerals, nitroglycerin, nortriptyline, potassium chloride sa, trazodone, azithromycin, and  ipratropium-albuterol.  SURGICAL HISTORY:  Past Surgical History  Procedure Laterality Date  . Coronary artery bypass graft  06/2009    X3, LIMA to LAD, SVG to diagonal, SVG to the posterior descending artery.  . Tonsillectomy    . Knee arthroscopy  08/2001  . Transthoracic echocardiogram  11/12/2010     Ejection fraction felt to be around 50%.  Wall thickness was increased in a pattern of mild LVH.  Moderately dilated left atrium.  Mildly dilated right atrium. Right ventricle was mildly dilated with mildly reduced systolic function  . Cardiac catheterization  06/09/2009    inferior wall hypokinesia with ejection fraction of 55%.  . Abdominal hysterectomy    . Joint replacement    . Tee without cardioversion  05/23/2011    Procedure: TRANSESOPHAGEAL ECHOCARDIOGRAM (TEE);  Surgeon: Lelon Perla, MD;  Location: [facility];  Service: Cardiovascular;  Laterality: N/A;  . Incision and drainage abscess  05/07/2012    Procedure: INCISION AND DRAINAGE ABSCESS;  Surgeon: Adin Hector, MD;  Location: [facility];  Service: General;  Laterality: N/A;  Groin wound  . Total abdominal hysterectomy  1979  . Breast biopsy  Left  . Bilateral salpingectomy  10/2000  . Breast biopsy  09/18/2009    fibrocystic change  . Coronary artery bypass graft  06/2009  . Pacemaker insertion  08/2012  . Colonoscopy N/A 01/19/2013    Procedure: COLONOSCOPY;  Surgeon: Inda Castle, MD;  Location: [facility];  Service: Endoscopy;  Laterality: N/A;  . Esophagogastroduodenoscopy N/A 01/19/2013    Procedure: ESOPHAGOGASTRODUODENOSCOPY (EGD);  Surgeon: Inda Castle, MD;  Location: [facility];  Service: Endoscopy;  Laterality: N/A;    REVIEW OF SYSTEMS:   Constitutional: Denies fevers, chills or abnormal weight loss Eyes: Denies blurriness of vision Ears, nose, mouth, throat, and face: Denies mucositis or sore throat Respiratory: Denies cough, or wheezes. Positive for dyspnea on exertion. Cardiovascular: Denies  palpitation, chest discomfort. Positive for lower extremity swelling. Gastrointestinal:  Denies nausea, heartburn or change in bowel habits Skin: Denies abnormal skin rashes Lymphatics: Denies new lymphadenopathy or easy bruising Neurological:Denies numbness, tingling or new weaknesses Behavioral/Psych: Mood is stable, no new changes  All other systems were reviewed with the patient and are negative.  PHYSICAL EXAMINATION: ECOG PERFORMANCE STATUS: 1-2  Blood pressure 123/58, pulse 76, temperature 98.9 F (37.2 C), temperature source Oral, resp. rate 20, height 5' 3.5" (1.613 m), weight 235 lb 14.4 oz (107.004 kg), SpO2 98 %.   GENERAL:alert, no distress and comfortable; moderately obese, sitting in wheelchair. SKIN: skin color, texture, turgor are normal, no rashes or significant lesions EYES: normal, Conjunctiva are pink and non-injected, sclera clear OROPHARYNX:no exudate, no erythema and lips, buccal mucosa, and tongue normal  NECK: supple, thyroid normal size, non-tender, without nodularity LYMPH:  no palpable lymphadenopathy in the cervical, axillary or supraclavicular LUNGS: CRACKLES at bases with normal breathing effort, few rhonchi/wheezes HEART:  RR with pacemaker in the left infraclavicular area. Chronic lower extremity brawny edema with stasis changes.  ABDOMEN:abdomen soft, non-tender and normal bowel sounds Musculoskeletal:no cyanosis of digits and no clubbing  NEURO: alert & oriented x 3 with fluent speech, no focal motor/sensory deficits  Labs:  Lab Results  Component Value Date   WBC 5.3 05/05/2014   HGB 9.0* 05/05/2014   HCT 29.8* 05/05/2014   MCV 84.4 05/05/2014   PLT 176 05/05/2014   NEUTROABS 3.0 05/05/2014      Chemistry      Component Value Date/Time   NA 137 05/05/2014 1048   NA 139 02/27/2014 0642   K 4.4 05/05/2014 1048   K 3.9 02/27/2014 0642   CL 99 02/27/2014 0642   CL 105 08/30/2012 1108   CO2 24 05/05/2014 1048   CO2 26 02/27/2014 0642    BUN 40.5* 05/05/2014 1048   BUN 39* 02/27/2014 0642   CREATININE 2.4* 05/05/2014 1048   CREATININE 1.75* 02/27/2014 0642   CREATININE 1.98* 02/05/2014 1134      Component Value Date/Time   CALCIUM 8.7 05/05/2014 1048   CALCIUM 8.3* 02/27/2014 0642   ALKPHOS 140 05/05/2014 1048   ALKPHOS 103 02/25/2014 0614   AST 29 05/05/2014 1048   AST 27 02/25/2014 0614   ALT 20 05/05/2014 1048   ALT 16 02/25/2014 0614   BILITOT 0.53 05/05/2014 1048   BILITOT 0.3 02/25/2014 0614     CBC:  Recent Labs Lab 05/05/14 1048  WBC 5.3  NEUTROABS 3.0  HGB 9.0*  HCT 29.8*  MCV 84.4  PLT 176   Studies:  Dg Chest 2 View  05/05/2014   CLINICAL DATA:  Patient's needs she has had a cough and shortness of Breath for 1 and half years since a pacemaker was placed.  EXAM: CHEST  2 VIEW  COMPARISON:  01/16/2014.  FINDINGS: Changes from cardiac surgery are stable. Cardiac silhouette is mild to moderately enlarged. No mediastinal or hilar masses or evidence of adenopathy.  Mild interstitial prominence is noted centrally, at the lung bases and  along the fissures. No airspace consolidation no pleural effusions or pneumothorax.  Left anterior chest wall biventricular cardioverter-defibrillator is well positioned.  Bony thorax is intact.  IMPRESSION: 1. Mild interstitial prominence along with cardiomegaly suggest mild chronic congestive heart failure. No evidence of pneumonia.   Electronically Signed   By: Lajean Manes M.D.   On: 05/05/2014 14:31     RADIOGRAPHIC STUDIES: No results found.  ASSESSMENT: [name] 80 y.o. female with a history of Cough - Plan: DG Chest 2 View  Anemia due to chronic renal failure treated with erythropoietin, stage 3 (moderate) - Plan: CBC with Differential   PLAN:  1. Anemia of chronic renal failure, with some intermittent chronic blood loss due to hemorrhoids.  --She feels tired in general but denies chest discomfort.  Her hemoglobin is 9.0 today.  She received her  aranesp 200 mcg subcutaneous.  We will continue giving her aranesp 200 mcg every two weeks  due to increasing blood transfusions requirements. She will continue to have CBCs every 2 weeks and have aranesp as indicated.  Her history is also notable for angiodyplasia and diverticulosis and hemorrhoids resulting in heme positive stool.    2. Afib/CHF/Pacer --Followed by cardiology.  Her medtronic biventricular pacemaker was working normally on 12/09 and recently checked 05/18.  She will continue amiodarone 100 mg daily.  3. CKDz. --Her creatinine is 2.4. The trend is as follows:    4. MGUS. --Her IgG levels remains stable. Her IgG level was 1990 on 09/27/2013; it is 1920 on 02/06/2014 with a normal kappa:lamda ratio. These are being checked about every 6 months.  5. Bronchitis. -- We did CXR today showing chronic mild congestive changes, no pneumonia. Gave Levaquin for 5 days. Also Combivent inhaler bid x 5 days.  6. Cholelithiasis. --Patient to see Dr Benson Norway in GI.  7. Follow-up.   --Patient will return to clinic in 8 weeks for CBC, CMP and iron studies.  She will have labs in 2 weeks for consideration of aranesp. I will re-check her creatinine as it is trending up. She may benefit from a nephrology consultation.  All questions were answered. The patient knows to call the clinic with any problems, questions or concerns. We can certainly see the patient much sooner if necessary.  I spent 15 minutes counseling the patient face to face. The total time spent in the appointment was 20 minutes.    Bernadene Bell, MD Medical Hematologist/Oncologist [facility] Pager: [phone] Office No: [phone]

## 2014-05-05 NOTE — Telephone Encounter (Signed)
I spoke with pharmacist and changed to Levaquib x 5 days 500 mg. She had levaquin before with out any problems.

## 2014-05-06 ENCOUNTER — Other Ambulatory Visit: Payer: Self-pay | Admitting: *Deleted

## 2014-05-06 DIAGNOSIS — R059 Cough, unspecified: Secondary | ICD-10-CM

## 2014-05-06 DIAGNOSIS — R05 Cough: Secondary | ICD-10-CM

## 2014-05-06 MED ORDER — LEVOFLOXACIN 500 MG PO TABS: 500.0000 mg | ORAL_TABLET | Freq: Every day | ORAL | Status: AC

## 2014-05-06 NOTE — Progress Notes (Signed)
ADDENDUM:  After I have reviewed the Chest Xray, I spoke with patient's pharmacist. Apparently Zpak, Levaquin and eveb Bactrim can interact with patient;s medications like Warfarin, Trazodone and Amiodarone. Her cough is more likely cardioogenic in nature and is lessened today when I spoke with patient's spouse (05/06/14). I told them that if she develops any signs of infections like fever, chills, rigors, night sweats, cold, diarrhea, phlegm to call us. Right now we can watch her off antibiotiocs. She will continue to use the Combivent inhaler I gave to her.

## 2014-05-09 ENCOUNTER — Telehealth: Payer: Self-pay | Admitting: Cardiology

## 2014-05-09 ENCOUNTER — Ambulatory Visit (INDEPENDENT_AMBULATORY_CARE_PROVIDER_SITE_OTHER): Payer: Medicare Other | Admitting: Pharmacist Clinician (PhC)/ Clinical Pharmacy Specialist

## 2014-05-09 DIAGNOSIS — I480 Paroxysmal atrial fibrillation: Secondary | ICD-10-CM

## 2014-05-09 DIAGNOSIS — Z5181 Encounter for therapeutic drug level monitoring: Secondary | ICD-10-CM

## 2014-05-09 DIAGNOSIS — I4891 Unspecified atrial fibrillation: Secondary | ICD-10-CM

## 2014-05-09 DIAGNOSIS — Z7901 Long term (current) use of anticoagulants: Secondary | ICD-10-CM

## 2014-05-09 LAB — POCT INR: INR: 6.7

## 2014-05-09 LAB — PROTIME-INR
INR: 4.64 — ABNORMAL HIGH (ref <1.50)
PROTHROMBIN TIME: 43.8 s — AB (ref 11.6–15.2)

## 2014-05-09 NOTE — Telephone Encounter (Signed)
PT 43.8 INR 4.64  Call taken by Bartholomew Crews - message routed Erasmo Downer, PharmD

## 2014-05-09 NOTE — Telephone Encounter (Signed)
[facility]

## 2014-05-10 NOTE — Telephone Encounter (Signed)
Spoke with husband, INR from lab better at 4.64.  Pt currently on doxycycline, started Thursday.  Will have her hold warfarin today (Saturday Nov 7), then restart Sunday at 5mg  daily until repeat INR.  Moved appt from Monday to Friday Nov 13.  Spouse voiced understanding

## 2014-05-12 ENCOUNTER — Telehealth: Payer: Self-pay | Admitting: Pharmacist Clinician (PhC)/ Clinical Pharmacy Specialist

## 2014-05-12 ENCOUNTER — Ambulatory Visit: Payer: Medicare Other | Admitting: Pharmacist Clinician (PhC)/ Clinical Pharmacy Specialist

## 2014-05-12 NOTE — Telephone Encounter (Signed)
-----   Message from Peter M Martinique, MD sent at 05/11/2014  9:42 AM EST ----- INR is lower but still too high. Will forward to coumadin clinic.  Peter Martinique MD, [facility]

## 2014-05-12 NOTE — Telephone Encounter (Signed)
Spoke with husband, INR better at 4.64.  Advised pt still hold warfarin thru Saturday, restart Sunday at 1 tablet (5mg ) daily, moved INR appt from Monday to Friday.  Husband voiced understanding

## 2014-05-16 ENCOUNTER — Ambulatory Visit: Payer: Medicare Other | Admitting: Pharmacist Clinician (PhC)/ Clinical Pharmacy Specialist

## 2014-05-16 ENCOUNTER — Ambulatory Visit (INDEPENDENT_AMBULATORY_CARE_PROVIDER_SITE_OTHER): Payer: Medicare Other | Admitting: Pharmacist Clinician (PhC)/ Clinical Pharmacy Specialist

## 2014-05-16 ENCOUNTER — Telehealth: Payer: Self-pay | Admitting: *Deleted

## 2014-05-16 DIAGNOSIS — Z5181 Encounter for therapeutic drug level monitoring: Secondary | ICD-10-CM

## 2014-05-16 DIAGNOSIS — I480 Paroxysmal atrial fibrillation: Secondary | ICD-10-CM

## 2014-05-16 DIAGNOSIS — I4891 Unspecified atrial fibrillation: Secondary | ICD-10-CM

## 2014-05-16 DIAGNOSIS — Z7901 Long term (current) use of anticoagulants: Secondary | ICD-10-CM

## 2014-05-16 LAB — POCT INR: INR: 3.9

## 2014-05-16 NOTE — Telephone Encounter (Signed)
My mother is a patient of Dr. Amalia Hailey.  I have a question about Diabetic shoes whether or not he will do a prescription for that or will I need to go to her general physician.

## 2014-05-19 ENCOUNTER — Ambulatory Visit (HOSPITAL_BASED_OUTPATIENT_CLINIC_OR_DEPARTMENT_OTHER): Payer: Medicare Other

## 2014-05-19 ENCOUNTER — Telehealth: Payer: Self-pay | Admitting: Physician Assistant

## 2014-05-19 ENCOUNTER — Ambulatory Visit (HOSPITAL_BASED_OUTPATIENT_CLINIC_OR_DEPARTMENT_OTHER): Payer: Medicare Other | Admitting: Physician Assistant

## 2014-05-19 ENCOUNTER — Encounter: Payer: Self-pay | Admitting: Physician Assistant

## 2014-05-19 ENCOUNTER — Other Ambulatory Visit (HOSPITAL_BASED_OUTPATIENT_CLINIC_OR_DEPARTMENT_OTHER): Payer: Medicare Other

## 2014-05-19 ENCOUNTER — Other Ambulatory Visit: Payer: Self-pay | Admitting: *Deleted

## 2014-05-19 VITALS — BP 121/59 | HR 72 | Temp 97.5°F | Resp 17 | Ht 63.0 in | Wt 239.3 lb

## 2014-05-19 DIAGNOSIS — N183 Chronic kidney disease, stage 3 (moderate): Secondary | ICD-10-CM

## 2014-05-19 DIAGNOSIS — D631 Anemia in chronic kidney disease: Secondary | ICD-10-CM

## 2014-05-19 DIAGNOSIS — D472 Monoclonal gammopathy: Secondary | ICD-10-CM | POA: Diagnosis not present

## 2014-05-19 DIAGNOSIS — N189 Chronic kidney disease, unspecified: Principal | ICD-10-CM

## 2014-05-19 LAB — CBC WITH DIFFERENTIAL/PLATELET
BASO%: 0.3 % (ref 0.0–2.0)
Basophils Absolute: 0 10*3/uL (ref 0.0–0.1)
EOS ABS: 0.2 10*3/uL (ref 0.0–0.5)
EOS%: 3.4 % (ref 0.0–7.0)
HCT: 27.1 % — ABNORMAL LOW (ref 34.8–46.6)
HEMOGLOBIN: 8.2 g/dL — AB (ref 11.6–15.9)
LYMPH%: 8.8 % — AB (ref 14.0–49.7)
MCH: 25.2 pg (ref 25.1–34.0)
MCHC: 30.3 g/dL — ABNORMAL LOW (ref 31.5–36.0)
MCV: 83.4 fL (ref 79.5–101.0)
MONO#: 1.6 10*3/uL — AB (ref 0.1–0.9)
MONO%: 24.7 % — ABNORMAL HIGH (ref 0.0–14.0)
NEUT#: 4 10*3/uL (ref 1.5–6.5)
NEUT%: 62.8 % (ref 38.4–76.8)
NRBC: 0 % (ref 0–0)
PLATELETS: 199 10*3/uL (ref 145–400)
RBC: 3.25 10*6/uL — AB (ref 3.70–5.45)
RDW: 17.9 % — ABNORMAL HIGH (ref 11.2–14.5)
WBC: 6.4 10*3/uL (ref 3.9–10.3)
lymph#: 0.6 10*3/uL — ABNORMAL LOW (ref 0.9–3.3)

## 2014-05-19 MED ORDER — METOPROLOL SUCCINATE ER 50 MG PO TB24: 50.0000 mg | ORAL_TABLET | Freq: Every day | ORAL | Status: AC

## 2014-05-19 MED ORDER — DARBEPOETIN ALFA 200 MCG/0.4ML IJ SOSY
200.0000 ug | PREFILLED_SYRINGE | Freq: Once | INTRAMUSCULAR | Status: AC
  Administered 2014-05-19: 200 ug via SUBCUTANEOUS
  Filled 2014-05-19: qty 0.4

## 2014-05-19 NOTE — Patient Instructions (Signed)
Darbepoetin Alfa injection What is this medicine? DARBEPOETIN ALFA (dar be POE e tin AL fa) helps your body make more red blood cells. It is used to treat anemia caused by chronic kidney failure and chemotherapy. This medicine may be used for other purposes; ask your health care provider or pharmacist if you have questions. COMMON BRAND NAME(S): Aranesp What should I tell my health care provider before I take this medicine? They need to know if you have any of these conditions: -blood clotting disorders or history of blood clots -cancer patient not on chemotherapy -cystic fibrosis -heart disease, such as angina, heart failure, or a history of a heart attack -hemoglobin level of 12 g/dL or greater -high blood pressure -low levels of folate, iron, or vitamin B12 -seizures -an unusual or allergic reaction to darbepoetin, erythropoietin, albumin, hamster proteins, latex, other medicines, foods, dyes, or preservatives -pregnant or trying to get pregnant -breast-feeding How should I use this medicine? This medicine is for injection into a vein or under the skin. It is usually given by a health care professional in a hospital or clinic setting. If you get this medicine at home, you will be taught how to prepare and give this medicine. Do not shake the solution before you withdraw a dose. Use exactly as directed. Take your medicine at regular intervals. Do not take your medicine more often than directed. It is important that you put your used needles and syringes in a special sharps container. Do not put them in a trash can. If you do not have a sharps container, call your pharmacist or healthcare provider to get one. Talk to your pediatrician regarding the use of this medicine in children. While this medicine may be used in children as young as 1 year for selected conditions, precautions do apply. Overdosage: If you think you have taken too much of this medicine contact a poison control center or  emergency room at once. NOTE: This medicine is only for you. Do not share this medicine with others. What if I miss a dose? If you miss a dose, take it as soon as you can. If it is almost time for your next dose, take only that dose. Do not take double or extra doses. What may interact with this medicine? Do not take this medicine with any of the following medications: -epoetin alfa This list may not describe all possible interactions. Give your health care provider a list of all the medicines, herbs, non-prescription drugs, or dietary supplements you use. Also tell them if you smoke, drink alcohol, or use illegal drugs. Some items may interact with your medicine. What should I watch for while using this medicine? Visit your prescriber or health care professional for regular checks on your progress and for the needed blood tests and blood pressure measurements. It is especially important for the doctor to make sure your hemoglobin level is in the desired range, to limit the risk of potential side effects and to give you the best benefit. Keep all appointments for any recommended tests. Check your blood pressure as directed. Ask your doctor what your blood pressure should be and when you should contact him or her. As your body makes more red blood cells, you may need to take iron, folic acid, or vitamin B supplements. Ask your doctor or health care provider which products are right for you. If you have kidney disease continue dietary restrictions, even though this medication can make you feel better. Talk with your doctor or health   care professional about the foods you eat and the vitamins that you take. What side effects may I notice from receiving this medicine? Side effects that you should report to your doctor or health care professional as soon as possible: -allergic reactions like skin rash, itching or hives, swelling of the face, lips, or tongue -breathing problems -changes in vision -chest  pain -confusion, trouble speaking or understanding -feeling faint or lightheaded, falls -high blood pressure -muscle aches or pains -pain, swelling, warmth in the leg -rapid weight gain -severe headaches -sudden numbness or weakness of the face, arm or leg -trouble walking, dizziness, loss of balance or coordination -seizures (convulsions) -swelling of the ankles, feet, hands -unusually weak or tired Side effects that usually do not require medical attention (report to your doctor or health care professional if they continue or are bothersome): -diarrhea -fever, chills (flu-like symptoms) -headaches -nausea, vomiting -redness, stinging, or swelling at site where injected This list may not describe all possible side effects. Call your doctor for medical advice about side effects. You may report side effects to FDA at 1-800-FDA-1088. Where should I keep my medicine? Keep out of the reach of children. Store in a refrigerator between 2 and 8 degrees C (36 and 46 degrees F). Do not freeze. Do not shake. Throw away any unused portion if using a single-dose vial. Throw away any unused medicine after the expiration date. NOTE: This sheet is a summary. It may not cover all possible information. If you have questions about this medicine, talk to your doctor, pharmacist, or health care provider.  2015, [employer]vier/[employer] Standard. (2008-06-03 10:23:57)  

## 2014-05-19 NOTE — Progress Notes (Signed)
[facility] OFFICE PROGRESS NOTE  [name], MD [place] [id number]  DIAGNOSIS: MGUS (monoclonal gammopathy of unknown significance) - Plan: SPEP & IFE with QIG, Kappa/lambda light chains, Beta 2 microglobulin  Anemia associated with chronic renal failure, stage 3 (moderate)  No chief complaint on file.   CURRENT THERAPY: Aranesp 200 mcg subcutaneous every two weeks for a hemoglobin less than 11.    INTERVAL HISTORY:  [name] 80 y.o. female with a history of IgG lambda MGUS, Anemia associated with chronic renal failure who presents for follow up last seen here on 05/05/2014 by Dr. Lona Kettle. Today, she is accompanied by her daughter and son. She was recently treated with an antibiotic for some congestion by her primary care physician Dr. Virgina Jock. She continues to have a cough that is sometimes wet sounding and associated with some upper respiratory wheezing. He also follows her diabetes mellitus. She is not eating as regularly as she should but is taking all of her diabetes medication and is having some nighttime low blood sugars. She is on Coumadin with close monitoring by cardiology. She has a history of active on chronic combine systolic and diastolic congestive heart failure, NYHA class 4, coronary artery disease as well as atrial fibrillation with rapid ventricular response. She has a follow-up appointment with cardiology on 05/23/2014. She denies any recent fevers , chest pain, abdominal pain, nausea, and vomiting. She does note a small amount of blood due to her hemorrhoids. She reports dyspnea on exertion on walking short distances as well as increased edema to her lower extremities. Dr. Virgina Jock recently increased her Lasix to 80 mg in the morning and 60 mg in the evening.  Regarding her cough she was evaluated by Dr. Lona Kettle on 05/13/2014 which included a chest x-ray that revealed mild interstitial prominence along the cardiac along with cardiomegaly  suggest mild chronic congestive heart failure. There is no evidence of pneumonia.she reports some episodes of labored breathing and not sleeping well. She was prescribed trazodone by Dr. Virgina Jock to her address her issues with difficulty sleeping.    MEDICAL HISTORY: Past Medical History  Diagnosis Date  . Coronary artery disease     a. Severe two vessel; s/p CABG (LIMA-LAD, SVG-diag, SVG-PDA);  b. 05/2012 NSTEMI in setting of rapid aflutter;  c. 06/2012 Lexiscan cardiolite: small, mild reversible defect in lateral wall->mild ischemia, low-risk->med Rx.  Marland Kitchen Hypertension   . Type II diabetes mellitus   . Hyperlipidemia   . Diabetic neuropathy   . CVA (cerebral vascular accident) 2004  . Hypothyroidism   . Chronic anemia   . GERD (gastroesophageal reflux disease)   . Arthritis   . Memory loss, short term   . COPD (chronic obstructive pulmonary disease)   . Blood transfusion without reported diagnosis   . Chronic combined systolic and diastolic CHF (congestive heart failure)     a. 07/2012 Echo: EF 30%, Gr II DD, Mild AS/MR, Mod-Sev TR, mild bi-atrial and RV dil, PASP 68mHg.  . Moderate mitral regurgitation     a. mod by TEE 2012, mild by echo 2013, 07/2012  . Severe tricuspid regurgitation     a. Severe by TEE 2012, mod by echo 2013, mod-sev by echo 07/2012  . Chronic vulvitis 07/1993  . Idiopathic anemia 2006  . Atrial fibrillation     a. Failed amiodarone (prolonged QT), not a candidate for class IC meds due to her CAD. No Multaq due to CHF hx;  b. 07/2012  s/p SJM Anthem RF Bi-V PPM, ser # [id number] and AV node RFCA;  c. chronic coumadin.  . Atrial flutter   . Pacemaker   . GI bleed     a. 10/2011 - Dr Benson Norway did scope which showed hiatal hernia, colon revealed diverticula (diverticular bleed), internal/ext hemorrhoids.  . GI bleed 02/25/2014    REQUIRING TRANSFUSIONS    INTERIM HISTORY: has Atrial fibrillation; Hypothyroidism; Chronic anticoagulation; CAD (coronary artery disease);  Diastolic dysfunction; Anemia associated with chronic renal failure; Atrial tachycardia; Right heart failure; MGUS (monoclonal gammopathy of unknown significance); GI bleed; CKD (chronic kidney disease) stage 3, GFR 30-59 ml/min; Abscess of right thigh s/p I&D 17/10/812; Chronic systolic CHF (congestive heart failure); Atrial flutter; Long term (current) use of anticoagulants; Acute on chronic combined systolic and diastolic congestive heart failure, NYHA class 4; Diabetes mellitus; Chronic anemia; Cardiomyopathy-cause unknown ? rate related v ischemic; Acute respiratory failure; Pulmonary edema; Pleural effusion; Atrial fibrillation with RVR; Physical deconditioning; Fever; Pneumonia; Weakness; Bacteremia due to Escherichia coli; Coronary artery disease; Hx of CABG; Chronic combined systolic and diastolic congestive heart failure; Diabetes mellitus type 2 with complications; Acute on chronic renal failure; Pacemaker; DOE (dyspnea on exertion); Benign neoplasm of stomach; Adenomatous duodenal polyp; Diverticulosis of colon (without mention of hemorrhage); Symptomatic anemia; Encounter for therapeutic drug monitoring; CHF (congestive heart failure); Lower GI bleed; and Over-anticoagulated on her problem list.    ALLERGIES:  is allergic to penicillins; betadine; capoten; codone; methadone; and propoxyphene.  MEDICATIONS: has a current medication list which includes the following prescription(s): acetaminophen, amiodarone, atorvastatin, diltiazem, donepezil, esomeprazole, ezetimibe, furosemide, humalog kwikpen, insulin glargine, insulin lispro, ipratropium-albuterol, levothyroxine, metoprolol succinate, multiple vitamins-minerals, nortriptyline, potassium chloride sa, trazodone, alprazolam, levofloxacin, and nitroglycerin.  SURGICAL HISTORY:  Past Surgical History  Procedure Laterality Date  . Coronary artery bypass graft  06/2009    X3, LIMA to LAD, SVG to diagonal, SVG to the posterior descending artery.   . Tonsillectomy    . Knee arthroscopy  08/2001  . Transthoracic echocardiogram  11/12/2010     Ejection fraction felt to be around 50%.  Wall thickness was increased in a pattern of mild LVH.  Moderately dilated left atrium.  Mildly dilated right atrium. Right ventricle was mildly dilated with mildly reduced systolic function  . Cardiac catheterization  06/09/2009    inferior wall hypokinesia with ejection fraction of 55%.  . Abdominal hysterectomy    . Joint replacement    . Tee without cardioversion  05/23/2011    Procedure: TRANSESOPHAGEAL ECHOCARDIOGRAM (TEE);  Surgeon: Lelon Perla, MD;  Location: [facility];  Service: Cardiovascular;  Laterality: N/A;  . Incision and drainage abscess  05/07/2012    Procedure: INCISION AND DRAINAGE ABSCESS;  Surgeon: Adin Hector, MD;  Location: [facility];  Service: General;  Laterality: N/A;  Groin wound  . Total abdominal hysterectomy  1979  . Breast biopsy  Left  . Bilateral salpingectomy  10/2000  . Breast biopsy  09/18/2009    fibrocystic change  . Coronary artery bypass graft  06/2009  . Pacemaker insertion  08/2012  . Colonoscopy N/A 01/19/2013    Procedure: COLONOSCOPY;  Surgeon: Inda Castle, MD;  Location: [facility];  Service: Endoscopy;  Laterality: N/A;  . Esophagogastroduodenoscopy N/A 01/19/2013    Procedure: ESOPHAGOGASTRODUODENOSCOPY (EGD);  Surgeon: Inda Castle, MD;  Location: [facility];  Service: Endoscopy;  Laterality: N/A;    REVIEW OF SYSTEMS:   Constitutional: Denies fevers, chills or abnormal weight loss, reports difficulty sleeping Eyes:  Denies blurriness of vision Ears, nose, mouth, throat, and face: Denies mucositis or sore throat Respiratory: Reportscough, occasional wheezes. Positive for dyspnea on exertion. Cardiovascular: Denies palpitation, chest discomfort. Positive for lower extremity swelling. Gastrointestinal:  Denies nausea, heartburn or change in bowel habits Skin: Denies abnormal skin  rashes Lymphatics: Denies new lymphadenopathy or easy bruising Neurological:Denies numbness, tingling or new weaknesses Behavioral/Psych: Mood is stable, no new changes  All other systems were reviewed with the patient and are negative.  PHYSICAL EXAMINATION: ECOG PERFORMANCE STATUS: 1-2  Blood pressure 121/59, pulse 72, temperature 97.5 F (36.4 C), temperature source Oral, resp. rate 17, height 5' 3"  (1.6 m), weight 239 lb 4.8 oz (108.546 kg), SpO2 96 %.   GENERAL:alert, no distress and comfortable; moderately obese, sitting in wheelchair. SKIN: skin color, texture, turgor are normal, no rashes or significant lesions EYES: normal, Conjunctiva are pink and non-injected, sclera clear OROPHARYNX:no exudate, no erythema and lips, buccal mucosa, and tongue normal  NECK: supple, thyroid normal size, non-tender, without nodularity LYMPH:  no palpable lymphadenopathy in the cervical, axillary or supraclavicular LUNGS: Clear at bases with normal breathing effort, occasional upper respiratory wheezing after coughing HEART:  RR with pacemaker in the left infraclavicular area. Chronic lower extremity brawny edema with stasis changes.  ABDOMEN:abdomen soft, non-tender and normal bowel sounds Musculoskeletal:no cyanosis of digits and no clubbing  2+ - 3+ edema bilateral lower extremities NEURO: alert & oriented x 3 with fluent speech, no focal motor/sensory deficits  Labs:  Lab Results  Component Value Date   WBC 6.4 05/19/2014   HGB 8.2* 05/19/2014   HCT 27.1* 05/19/2014   MCV 83.4 05/19/2014   PLT 199 05/19/2014   NEUTROABS 4.0 05/19/2014      Chemistry      Component Value Date/Time   NA 137 05/05/2014 1048   NA 139 02/27/2014 0642   K 4.4 05/05/2014 1048   K 3.9 02/27/2014 0642   CL 99 02/27/2014 0642   CL 105 08/30/2012 1108   CO2 24 05/05/2014 1048   CO2 26 02/27/2014 0642   BUN 40.5* 05/05/2014 1048   BUN 39* 02/27/2014 0642   CREATININE 2.4* 05/05/2014 1048   CREATININE  1.75* 02/27/2014 0642   CREATININE 1.98* 02/05/2014 1134      Component Value Date/Time   CALCIUM 8.7 05/05/2014 1048   CALCIUM 8.3* 02/27/2014 0642   ALKPHOS 140 05/05/2014 1048   ALKPHOS 103 02/25/2014 0614   AST 29 05/05/2014 1048   AST 27 02/25/2014 0614   ALT 20 05/05/2014 1048   ALT 16 02/25/2014 0614   BILITOT 0.53 05/05/2014 1048   BILITOT 0.3 02/25/2014 0614     CBC:  Recent Labs Lab 05/19/14 1115  WBC 6.4  NEUTROABS 4.0  HGB 8.2*  HCT 27.1*  MCV 83.4  PLT 199   Studies:  No results found.   RADIOGRAPHIC STUDIES: No results found.  ASSESSMENT: Alzora Ha Stumpo 80 y.o. female with a history of MGUS (monoclonal gammopathy of unknown significance) - Plan: SPEP & IFE with QIG, Kappa/lambda light chains, Beta 2 microglobulin  Anemia associated with chronic renal failure, stage 3 (moderate)   PLAN:  1. Anemia of chronic renal failure, with some intermittent chronic blood loss due to hemorrhoids.  --She feels tired in general but denies chest discomfort.  Her hemoglobin is 8.2 today.  She received her aranesp 200 mcg subcutaneous.  The patient was reviewed with Dr. Lona Kettle. The patient was also reviewed with Dr. Burr Medico who will be taking  of the patient's care.Dr. Burr Medico was introduced to the patient and her family members.We will continue giving her aranesp 200 mcg every two weeks  due to increasing blood transfusions requirements. She will continue to have CBCs every 2 weeks and have aranesp as indicated.  Her history is also notable for angiodyplasia and diverticulosis and hemorrhoids resulting in heme positive stool.    2. Afib/CHF/Pacer --Followed by cardiology.  Her medtronic biventricular pacemaker was working normally on 12/09 and recently checked 05/18.  She will continue amiodarone 100 mg daily. She'll follow-up with cardiology as scheduled on 05/23/2014. Some of her breathing issues and occasional wheezing may be related to her underlying congestive heart  failure.  3. CKDz. --Her most recent creatinine was 2.4.    4. MGUS. --Her IgG levels remains stable. Her IgG level was 1990 on 09/27/2013; it is 1920 on 02/06/2014 with a normal kappa:lamda ratio. These are being checked about every 6 months.  5. Bronchitis. -- patient completed her course of Levaquin and continues to use the Combivent inhaler  6. Cholelithiasis. --Patient to see Dr Benson Norway in GI.  7. Follow-up.   --Patient will return to clinic in 3 months with an SPEP with quantitative immune oh globulin, beta-2 microglobin and kappa lambda light chains to reevaluate her MGUS. She may need a bone marrow biopsy and aspiration to see if she has converted from MGUS to multiple myeloma. Her anemia is likely multifactorial certainly including MGUS/possible multiple myeloma as well as chronic renal disease. As stated above, for now she will continue with CBC and Aranesp every 2 weeks. All questions were answered. The patient knows to call the clinic with any problems, questions or concerns. We can certainly see the patient much sooner if necessary.  Wynetta Emery, Jiovany Scheffel E, PA-C

## 2014-05-19 NOTE — Telephone Encounter (Signed)
Pt confirmed labs/ov/inj per 11/16 POF, gave pt AVS..... KJ

## 2014-05-20 NOTE — Patient Instructions (Signed)
Follow-up with your cardiologist as previously scheduled Continue every 2 week lab and injection appointments as indicated Follow-up in 3 months with repeat protein studies to reevaluate your monoclonal gammopathy of unknown significance (MGUS)

## 2014-05-21 ENCOUNTER — Other Ambulatory Visit: Payer: Self-pay

## 2014-05-21 ENCOUNTER — Emergency Department (HOSPITAL_COMMUNITY)
Admission: EM | Admit: 2014-05-21 | Discharge: 2014-05-21 | Disposition: A | Discharge status: Home / Self Care | Payer: Medicare Other | Attending: Emergency Medicine | Admitting: Emergency Medicine

## 2014-05-21 ENCOUNTER — Emergency Department (HOSPITAL_COMMUNITY): Payer: Medicare Other

## 2014-05-21 ENCOUNTER — Encounter (HOSPITAL_COMMUNITY): Payer: Self-pay | Admitting: Emergency Medicine

## 2014-05-21 DIAGNOSIS — Z9889 Other specified postprocedural states: Secondary | ICD-10-CM | POA: Diagnosis not present

## 2014-05-21 DIAGNOSIS — I5042 Chronic combined systolic (congestive) and diastolic (congestive) heart failure: Secondary | ICD-10-CM | POA: Diagnosis not present

## 2014-05-21 DIAGNOSIS — Z79899 Other long term (current) drug therapy: Secondary | ICD-10-CM | POA: Diagnosis not present

## 2014-05-21 DIAGNOSIS — R0609 Other forms of dyspnea: Secondary | ICD-10-CM

## 2014-05-21 DIAGNOSIS — E785 Hyperlipidemia, unspecified: Secondary | ICD-10-CM | POA: Insufficient documentation

## 2014-05-21 DIAGNOSIS — E1140 Type 2 diabetes mellitus with diabetic neuropathy, unspecified: Secondary | ICD-10-CM | POA: Diagnosis not present

## 2014-05-21 DIAGNOSIS — I4891 Unspecified atrial fibrillation: Secondary | ICD-10-CM | POA: Diagnosis not present

## 2014-05-21 DIAGNOSIS — Z8739 Personal history of other diseases of the musculoskeletal system and connective tissue: Secondary | ICD-10-CM | POA: Diagnosis not present

## 2014-05-21 DIAGNOSIS — Z7901 Long term (current) use of anticoagulants: Secondary | ICD-10-CM | POA: Diagnosis not present

## 2014-05-21 DIAGNOSIS — E039 Hypothyroidism, unspecified: Secondary | ICD-10-CM | POA: Insufficient documentation

## 2014-05-21 DIAGNOSIS — Z8742 Personal history of other diseases of the female genital tract: Secondary | ICD-10-CM | POA: Diagnosis not present

## 2014-05-21 DIAGNOSIS — Z951 Presence of aortocoronary bypass graft: Secondary | ICD-10-CM | POA: Insufficient documentation

## 2014-05-21 DIAGNOSIS — Z950 Presence of cardiac pacemaker: Secondary | ICD-10-CM | POA: Diagnosis not present

## 2014-05-21 DIAGNOSIS — I4892 Unspecified atrial flutter: Secondary | ICD-10-CM | POA: Insufficient documentation

## 2014-05-21 DIAGNOSIS — Z880 Allergy status to penicillin: Secondary | ICD-10-CM | POA: Diagnosis not present

## 2014-05-21 DIAGNOSIS — E11649 Type 2 diabetes mellitus with hypoglycemia without coma: Secondary | ICD-10-CM | POA: Insufficient documentation

## 2014-05-21 DIAGNOSIS — K219 Gastro-esophageal reflux disease without esophagitis: Secondary | ICD-10-CM | POA: Insufficient documentation

## 2014-05-21 DIAGNOSIS — E162 Hypoglycemia, unspecified: Secondary | ICD-10-CM

## 2014-05-21 DIAGNOSIS — Z794 Long term (current) use of insulin: Secondary | ICD-10-CM | POA: Diagnosis not present

## 2014-05-21 DIAGNOSIS — J441 Chronic obstructive pulmonary disease with (acute) exacerbation: Secondary | ICD-10-CM | POA: Insufficient documentation

## 2014-05-21 DIAGNOSIS — I10 Essential (primary) hypertension: Secondary | ICD-10-CM | POA: Diagnosis not present

## 2014-05-21 DIAGNOSIS — Z862 Personal history of diseases of the blood and blood-forming organs and certain disorders involving the immune mechanism: Secondary | ICD-10-CM | POA: Diagnosis not present

## 2014-05-21 DIAGNOSIS — Z87891 Personal history of nicotine dependence: Secondary | ICD-10-CM | POA: Insufficient documentation

## 2014-05-21 DIAGNOSIS — R0600 Dyspnea, unspecified: Secondary | ICD-10-CM

## 2014-05-21 DIAGNOSIS — R0602 Shortness of breath: Secondary | ICD-10-CM

## 2014-05-21 DIAGNOSIS — I2510 Atherosclerotic heart disease of native coronary artery without angina pectoris: Secondary | ICD-10-CM | POA: Diagnosis not present

## 2014-05-21 DIAGNOSIS — R42 Dizziness and giddiness: Secondary | ICD-10-CM | POA: Diagnosis present

## 2014-05-21 LAB — CBC
HCT: 27.2 % — ABNORMAL LOW (ref 36.0–46.0)
Hemoglobin: 8.1 g/dL — ABNORMAL LOW (ref 12.0–15.0)
MCH: 24.3 pg — ABNORMAL LOW (ref 26.0–34.0)
MCHC: 29.8 g/dL — ABNORMAL LOW (ref 30.0–36.0)
MCV: 81.7 fL (ref 78.0–100.0)
Platelets: 190 10*3/uL (ref 150–400)
RBC: 3.33 MIL/uL — ABNORMAL LOW (ref 3.87–5.11)
RDW: 17.7 % — ABNORMAL HIGH (ref 11.5–15.5)
WBC: 5.7 10*3/uL (ref 4.0–10.5)

## 2014-05-21 LAB — BASIC METABOLIC PANEL
Anion gap: 13 (ref 5–15)
BUN: 63 mg/dL — ABNORMAL HIGH (ref 6–23)
CO2: 26 mEq/L (ref 19–32)
Calcium: 9 mg/dL (ref 8.4–10.5)
Chloride: 99 mEq/L (ref 96–112)
Creatinine, Ser: 2.56 mg/dL — ABNORMAL HIGH (ref 0.50–1.10)
GFR calc Af Amer: 19 mL/min — ABNORMAL LOW (ref >90)
GFR calc non Af Amer: 17 mL/min — ABNORMAL LOW (ref >90)
Glucose, Bld: 51 mg/dL — ABNORMAL LOW (ref 70–99)
Potassium: 4.7 mEq/L (ref 3.7–5.3)
Sodium: 138 mEq/L (ref 137–147)

## 2014-05-21 LAB — CBG MONITORING, ED
GLUCOSE-CAPILLARY: 51 mg/dL — AB (ref 70–99)
GLUCOSE-CAPILLARY: 251 mg/dL — AB (ref 70–99)
GLUCOSE-CAPILLARY: 188 mg/dL — AB (ref 70–99)
GLUCOSE-CAPILLARY: 206 mg/dL — AB (ref 70–99)
Glucose-Capillary: 51 mg/dL — ABNORMAL LOW (ref 70–99)

## 2014-05-21 LAB — TROPONIN I: Troponin I: <0.3 ng/mL (ref <0.30)

## 2014-05-21 LAB — PRO B NATRIURETIC PEPTIDE: Pro B Natriuretic peptide (BNP): 1601 pg/mL — ABNORMAL HIGH (ref 0–450)

## 2014-05-21 MED ORDER — DEXTROSE 50 % IV SOLN
1.0000 | Freq: Once | INTRAVENOUS | Status: AC
  Administered 2014-05-21: 50 mL via INTRAVENOUS
  Filled 2014-05-21: qty 50

## 2014-05-21 MED ORDER — IPRATROPIUM-ALBUTEROL 0.5-2.5 (3) MG/3ML IN SOLN
3.0000 mL | Freq: Once | RESPIRATORY_TRACT | Status: AC
  Administered 2014-05-21: 3 mL via RESPIRATORY_TRACT
  Filled 2014-05-21: qty 3

## 2014-05-21 MED ORDER — SODIUM CHLORIDE 0.9 % IV BOLUS (SEPSIS)
1000.0000 mL | Freq: Once | INTRAVENOUS | Status: AC
  Administered 2014-05-21: 1000 mL via INTRAVENOUS

## 2014-05-21 NOTE — ED Notes (Signed)
Pt became winded with exertion; PA notified

## 2014-05-21 NOTE — ED Notes (Signed)
Meal tray ordered 

## 2014-05-21 NOTE — ED Notes (Signed)
Pt. felt dizzy/lightheaded this morning after using the bathroom , slid while sitting at side of bed - no injury , CBG =40 , received 1 oral glucose by EMS prior to arrival , alert and oriented at arrival , denies pain / respirations unlabored .

## 2014-05-21 NOTE — ED Notes (Signed)
CBG- 188 

## 2014-05-21 NOTE — ED Provider Notes (Signed)
CSN: [id number]     Arrival date & time 05/21/14  0545 History   First MD Initiated Contact with Patient 05/21/14 0559     Chief Complaint  Patient presents with  . Dizziness  . Hypoglycemia     (Consider location/radiation/quality/duration/timing/severity/associated sxs/prior Treatment) HPI Pt is a 80yo female with hx of CAD s/p CABG in 2013, HTN, type II IDDM, hyperlipidemia, diabetic neuropathy, CVA, hypothyroidism, chronic anemia, COPD, and GI bleed requiring transfusions 02/25/14, presenting to ED via EMS from home with reports of generalized weakness and dizziness. Pt states she woke this morning to go to bathroom, when she got back to her bed she sat on side of her bed and slid to the floor due to the severe dizziness "room spinning" and generalized weakness. Per EMS, CBG was 40 on scene, pt was given 1 oral glucose PTA, pt was feeling better upon arrival to ED.  Pt does report having difficulty keeping CBG under control. States she did eat dinner last night and took her medications as prescribed. No recent change in her medications.  Pt also states over the last 1 month, she has had worsening SOB, DOE, dry cough, and swelling in her legs and abdomen.  Denies fever, chills, n/v/d. Family report pt has f/u appointment with her cardiologist, Dr. Martinique, later this month. Pt denies any chest pain, SOB or nausea at this time.   Past Medical History  Diagnosis Date  . Coronary artery disease     a. Severe two vessel; s/p CABG (LIMA-LAD, SVG-diag, SVG-PDA);  b. 05/2012 NSTEMI in setting of rapid aflutter;  c. 06/2012 Lexiscan cardiolite: small, mild reversible defect in lateral wall->mild ischemia, low-risk->med Rx.  Marland Kitchen Hypertension   . Type II diabetes mellitus   . Hyperlipidemia   . Diabetic neuropathy   . CVA (cerebral vascular accident) 2004  . Hypothyroidism   . Chronic anemia   . GERD (gastroesophageal reflux disease)   . Arthritis   . Memory loss, short term   . COPD (chronic  obstructive pulmonary disease)   . Blood transfusion without reported diagnosis   . Chronic combined systolic and diastolic CHF (congestive heart failure)     a. 07/2012 Echo: EF 30%, Gr II DD, Mild AS/MR, Mod-Sev TR, mild bi-atrial and RV dil, PASP 82mmHg.  . Moderate mitral regurgitation     a. mod by TEE 2012, mild by echo 2013, 07/2012  . Severe tricuspid regurgitation     a. Severe by TEE 2012, mod by echo 2013, mod-sev by echo 07/2012  . Chronic vulvitis 07/1993  . Idiopathic anemia 2006  . Atrial fibrillation     a. Failed amiodarone (prolonged QT), not a candidate for class IC meds due to her CAD. No Multaq due to CHF hx;  b. 07/2012 s/p SJM Anthem RF Bi-V PPM, ser # [id number] and AV node RFCA;  c. chronic coumadin.  . Atrial flutter   . Pacemaker   . GI bleed     a. 10/2011 - Dr Benson Norway did scope which showed hiatal hernia, colon revealed diverticula (diverticular bleed), internal/ext hemorrhoids.  . GI bleed 02/25/2014    REQUIRING TRANSFUSIONS   Past Surgical History  Procedure Laterality Date  . Coronary artery bypass graft  06/2009    X3, LIMA to LAD, SVG to diagonal, SVG to the posterior descending artery.  . Tonsillectomy    . Knee arthroscopy  08/2001  . Transthoracic echocardiogram  11/12/2010     Ejection fraction felt to be around 50%.  Wall thickness was increased in a pattern of mild LVH.  Moderately dilated left atrium.  Mildly dilated right atrium. Right ventricle was mildly dilated with mildly reduced systolic function  . Cardiac catheterization  06/09/2009    inferior wall hypokinesia with ejection fraction of 55%.  . Abdominal hysterectomy    . Joint replacement    . Tee without cardioversion  05/23/2011    Procedure: TRANSESOPHAGEAL ECHOCARDIOGRAM (TEE);  Surgeon: Lelon Perla, MD;  Location: [place];  Service: Cardiovascular;  Laterality: N/A;  . Incision and drainage abscess  05/07/2012    Procedure: INCISION AND DRAINAGE ABSCESS;  Surgeon: Adin Hector, MD;   Location: [facility];  Service: General;  Laterality: N/A;  Groin wound  . Total abdominal hysterectomy  1979  . Breast biopsy  Left  . Bilateral salpingectomy  10/2000  . Breast biopsy  09/18/2009    fibrocystic change  . Coronary artery bypass graft  06/2009  . Pacemaker insertion  08/2012  . Colonoscopy N/A 01/19/2013    Procedure: COLONOSCOPY;  Surgeon: Inda Castle, MD;  Location: [facility];  Service: Endoscopy;  Laterality: N/A;  . Esophagogastroduodenoscopy N/A 01/19/2013    Procedure: ESOPHAGOGASTRODUODENOSCOPY (EGD);  Surgeon: Inda Castle, MD;  Location: [facility];  Service: Endoscopy;  Laterality: N/A;   Family History  Problem Relation Age of Onset  . Colon cancer Mother   . Stroke Mother   . Hypertension Mother   . Breast cancer Mother   . Colon cancer Father   . Heart attack Father     x2  . Renal Disease Sister     dialysis-twin sister  . Renal Disease Sister     renal bypass   History  Substance Use Topics  . Smoking status: Former Smoker    Types: Cigarettes  . Smokeless tobacco: Never Used  . Alcohol Use: No   OB History    Gravida Para Term Preterm AB TAB SAB Ectopic Multiple Living   2 2 2       2      Review of Systems  Constitutional: Positive for fatigue. Negative for fever, chills and appetite change.  Respiratory: Positive for shortness of breath. Negative for cough.   Cardiovascular: Positive for leg swelling. Negative for chest pain and palpitations.  Gastrointestinal: Negative for nausea, vomiting, abdominal pain and diarrhea.  Genitourinary: Negative for dysuria, frequency and hematuria.  Musculoskeletal: Negative for myalgias and back pain.  Neurological: Positive for weakness. Tremors:  generalized.  All other systems reviewed and are negative.     Allergies  Penicillins; Tape; Betadine; Capoten; Codone; Methadone; and Propoxyphene  Home Medications   Prior to Admission medications   Medication Sig Start Date End Date Taking?  Authorizing Provider  acetaminophen (TYLENOL) 325 MG tablet Take 2 tablets (650 mg total) by mouth every 6 (six) hours as needed for mild pain (or Fever >/= 101). 02/27/14  Yes [name], MD  amiodarone (PACERONE) 200 MG tablet Take 100 mg by mouth daily.   Yes Historical Provider, MD  atorvastatin (LIPITOR) 40 MG tablet Take 40 mg by mouth every evening.    Yes Historical Provider, MD  diltiazem (CARDIZEM CD) 240 MG 24 hr capsule Take 1 capsule (240 mg total) by mouth daily. 03/04/14  Yes Peter M Martinique, MD  donepezil (ARICEPT) 10 MG tablet Take 10 mg by mouth at bedtime.   Yes Historical Provider, MD  esomeprazole (NEXIUM) 40 MG capsule Take 40 mg by mouth daily before breakfast.  Yes Historical Provider, MD  ezetimibe (ZETIA) 10 MG tablet Take 10 mg by mouth every evening.    Yes Historical Provider, MD  furosemide (LASIX) 20 MG tablet Take by mouth 2 (two) times daily. 80 mg in the am and 60 mg in the pm 01/22/14  Yes Eileen Stanford, PA-C  HUMALOG KWIKPEN 100 UNIT/ML KiwkPen Inject 20-70 Units into the skin 3 (three) times daily as needed (high blood sugar). Home sliding scale If blood sugar is <200 take 20 units If blood sugar is >200 take 25 units If blood sugar is less than 70, don't take any 05/06/14  Yes Historical Provider, MD  insulin glargine (LANTUS) 100 UNIT/ML injection Inject 50 Units into the skin 2 (two) times daily.    Yes Historical Provider, MD  Ipratropium-Albuterol (COMBIVENT) 20-100 MCG/ACT AERS respimat Inhale 1 puff into the lungs 2 (two) times daily. 05/05/14  Yes Aasim Marla Roe, MD  levothyroxine (SYNTHROID, LEVOTHROID) 100 MCG tablet Take 100 mcg by mouth every morning.    Yes Historical Provider, MD  metoprolol succinate (TOPROL-XL) 50 MG 24 hr tablet Take 1 tablet (50 mg total) by mouth daily. Take with or immediately following a meal. 05/19/14  Yes Peter M Martinique, MD  Multiple Vitamins-Minerals (ICAPS AREDS FORMULA PO) Take 1 tablet by mouth 2 (two) times  daily.   Yes Historical Provider, MD  nitroGLYCERIN (NITROSTAT) 0.4 MG SL tablet Place 0.4 mg under the tongue every 5 (five) minutes as needed. For chest pain   Yes Historical Provider, MD  nortriptyline (PAMELOR) 75 MG capsule Take 1 capsule (75 mg total) by mouth at bedtime. 02/27/14  Yes [name], MD  potassium chloride SA (K-DUR,KLOR-CON) 20 MEQ tablet Take 2 tablets (40 mEq total) by mouth 2 (two) times daily. 01/22/14  Yes Eileen Stanford, PA-C  traZODone (DESYREL) 100 MG tablet Take 100 mg by mouth at bedtime.   Yes Historical Provider, MD  warfarin (COUMADIN) 5 MG tablet Take 2.5-5 mg by mouth daily.    Yes Historical Provider, MD  ALPRAZolam Duanne Moron) 0.25 MG tablet Take 0.25 mg by mouth at bedtime as needed for sleep.  04/30/14   [name], MD  insulin lispro (HUMALOG) 100 UNIT/ML injection Inject 20-25 Units into the skin 2 (two) times daily as needed for high blood sugar. Home sliding scale If blood sugar is <200 take 20 units If blood sugar is >200 take 25 units If blood sugar is less than 70, don't take any    Historical Provider, MD  levofloxacin (LEVAQUIN) 500 MG tablet Take 1 tablet (500 mg total) by mouth daily. X 5 days Patient not taking: Reported on 05/21/2014 05/05/14   Aasim Marla Roe, MD   BP 108/87 mmHg  Pulse 70  Temp(Src) 97.9 F (36.6 C) (Oral)  Resp 17  SpO2 100% Physical Exam  Constitutional: She appears well-developed and well-nourished. No distress.  Morbidly obese female lying in exam bed, NAD  HENT:  Head: Normocephalic and atraumatic.  Eyes: Conjunctivae are normal. No scleral icterus.  Neck: Normal range of motion.  Cardiovascular: Normal rate, regular rhythm and normal heart sounds.   Regular rate and rhythm  Pulmonary/Chest: Effort normal and breath sounds normal. No respiratory distress. She has no wheezes. She has no rales. She exhibits no tenderness.  Lungs: decreased breath sounds in lower lung fields (possibly due to body habitus),  lungs otherwise clear.  Abdominal: Soft. Bowel sounds are normal. She exhibits no distension and no mass. There is  no tenderness. There is no rebound and no guarding.  Musculoskeletal: Normal range of motion. She exhibits edema ( 2+ bilaterally).  Neurological: She is alert.  Skin: Skin is warm and dry. She is not diaphoretic.  Bilateral lower legs: extensive dried, flaking, hyperpigmented skin. No erythema or warmth. No evidence of underlying infection.  Nursing note and vitals reviewed.   ED Course  Procedures (including critical care time) Labs Review Labs Reviewed  CBC - Abnormal; Notable for the following:    RBC 3.33 (*)    Hemoglobin 8.1 (*)    HCT 27.2 (*)    MCH 24.3 (*)    MCHC 29.8 (*)    RDW 17.7 (*)    All other components within normal limits  BASIC METABOLIC PANEL - Abnormal; Notable for the following:    Glucose, Bld 51 (*)    BUN 63 (*)    Creatinine, Ser 2.56 (*)    GFR calc non Af Amer 17 (*)    GFR calc Af Amer 19 (*)    All other components within normal limits  PRO B NATRIURETIC PEPTIDE - Abnormal; Notable for the following:    Pro B Natriuretic peptide (BNP) 1601.0 (*)    All other components within normal limits  CBG MONITORING, ED - Abnormal; Notable for the following:    Glucose-Capillary 51 (*)    All other components within normal limits  CBG MONITORING, ED - Abnormal; Notable for the following:    Glucose-Capillary 51 (*)    All other components within normal limits  CBG MONITORING, ED - Abnormal; Notable for the following:    Glucose-Capillary 251 (*)    All other components within normal limits  CBG MONITORING, ED - Abnormal; Notable for the following:    Glucose-Capillary 188 (*)    All other components within normal limits  CBG MONITORING, ED - Abnormal; Notable for the following:    Glucose-Capillary 206 (*)    All other components within normal limits  TROPONIN I    Imaging Review Dg Chest Port 1 View  05/21/2014   CLINICAL DATA:   Shortness of breath.  CHF.  Lobe struck her.  EXAM: PORTABLE CHEST - 1 VIEW  COMPARISON:  05/05/2014  FINDINGS: Postoperative changes in the mediastinum. Cardiac pacemaker. Shallow inspiration with cardiac enlargement. No pulmonary vascular congestion. No significant airspace disease in the lungs. No blunting of costophrenic angles. No pneumothorax.  IMPRESSION: Cardiac enlargement.  No evidence of active pulmonary disease.   Electronically Signed   By: Lucienne Capers M.D.   On: 05/21/2014 06:44     EKG Interpretation None       Date: 05/21/2014  Rate: 70  Rhythm: accelerated juntional rhythm  QRS Axis: normal  Intervals: absent P waves  ST/T Wave abnormalities: normal  Conduction Disutrbances:nonspecific intraventricular conduction delay  Narrative Interpretation:   Old EKG Reviewed: unchanged    MDM   Final diagnoses:  SOB (shortness of breath)  Hypoglycemia  DOE (dyspnea on exertion)    Pt with hx of IDDM presenting to ED with weakness and hypoglycemia, per EMS CBG 40 on scene.  Pt felt better upon arrival to ED. Pt reports difficulty controlling her blood sugar, also reports 1 month hx of gradually worsening CHF symptoms including leg edema and DOE.  Denies CP or SOB in ED. Was difficult for RN and IV team to start IV, in meantime, pt was given orange juice and 5 packets of sugar.  Once IV started, pt given 43mL  of dextrose 50%, CBG improved to 251 Labs: Cr gradually trending up, 2.56 today, labs otherwise consistent with previous.  CXR: cardiac enlargement. No evidence of active pulmonary disease.   Discussed Cr with Dr. Jeneen Rinks, will give 1L fluids. As pt is stable, will also give pt a meal tray. Will monitor CBG in ED and ambulate pt. If pt able to maintain appropriate CBG and stay hemodynamically stable, plan is to discharge pt home   9:27 AM Pt has eaten half of her breakfast. Son states pt appears more congested and coughing between each bite or each sip of drink.  On  reexamine, pt does have faint expiratory wheeze in upper lung fields. Will give duoneb and reassess.   10:30 AM Pt ambulated in halls but became SOB with any movement. Family reports she has had worsening DOE for the last 1 month.  In ED, pt is hemodynamically stable. Able to keep down breakfast provided to her in ED.    Discussed pt with Dr. Jeneen Rinks, pt may discharged home to f/u with PCP as previously scheduled for tomorrow and cardiology on Friday. Return precautions provided. Pt and family verbalized understanding and agreement with tx plan.   Noland Fordyce, PA-C 05/21/14 1559  Tanna Furry, MD 05/28/14 [phone]

## 2014-05-22 ENCOUNTER — Other Ambulatory Visit: Payer: Self-pay

## 2014-05-22 ENCOUNTER — Encounter (HOSPITAL_COMMUNITY): Payer: Self-pay | Admitting: *Deleted

## 2014-05-22 ENCOUNTER — Encounter: Payer: Self-pay | Admitting: Cardiology

## 2014-05-22 ENCOUNTER — Emergency Department (HOSPITAL_COMMUNITY)
Admission: EM | Admit: 2014-05-22 | Discharge: 2014-06-03 | Disposition: E | Payer: Medicare Other | Attending: Emergency Medicine | Admitting: Emergency Medicine

## 2014-05-22 DIAGNOSIS — K219 Gastro-esophageal reflux disease without esophagitis: Secondary | ICD-10-CM | POA: Diagnosis not present

## 2014-05-22 DIAGNOSIS — E785 Hyperlipidemia, unspecified: Secondary | ICD-10-CM | POA: Insufficient documentation

## 2014-05-22 DIAGNOSIS — Z9889 Other specified postprocedural states: Secondary | ICD-10-CM | POA: Insufficient documentation

## 2014-05-22 DIAGNOSIS — Z7901 Long term (current) use of anticoagulants: Secondary | ICD-10-CM | POA: Insufficient documentation

## 2014-05-22 DIAGNOSIS — Z87891 Personal history of nicotine dependence: Secondary | ICD-10-CM | POA: Diagnosis not present

## 2014-05-22 DIAGNOSIS — Z862 Personal history of diseases of the blood and blood-forming organs and certain disorders involving the immune mechanism: Secondary | ICD-10-CM | POA: Insufficient documentation

## 2014-05-22 DIAGNOSIS — E039 Hypothyroidism, unspecified: Secondary | ICD-10-CM | POA: Insufficient documentation

## 2014-05-22 DIAGNOSIS — Z950 Presence of cardiac pacemaker: Secondary | ICD-10-CM | POA: Insufficient documentation

## 2014-05-22 DIAGNOSIS — Z880 Allergy status to penicillin: Secondary | ICD-10-CM | POA: Insufficient documentation

## 2014-05-22 DIAGNOSIS — I2510 Atherosclerotic heart disease of native coronary artery without angina pectoris: Secondary | ICD-10-CM | POA: Insufficient documentation

## 2014-05-22 DIAGNOSIS — Z794 Long term (current) use of insulin: Secondary | ICD-10-CM | POA: Diagnosis not present

## 2014-05-22 DIAGNOSIS — E1140 Type 2 diabetes mellitus with diabetic neuropathy, unspecified: Secondary | ICD-10-CM | POA: Diagnosis not present

## 2014-05-22 DIAGNOSIS — Z951 Presence of aortocoronary bypass graft: Secondary | ICD-10-CM | POA: Diagnosis not present

## 2014-05-22 DIAGNOSIS — Z87448 Personal history of other diseases of urinary system: Secondary | ICD-10-CM | POA: Insufficient documentation

## 2014-05-22 DIAGNOSIS — E669 Obesity, unspecified: Secondary | ICD-10-CM | POA: Diagnosis not present

## 2014-05-22 DIAGNOSIS — I469 Cardiac arrest, cause unspecified: Secondary | ICD-10-CM | POA: Insufficient documentation

## 2014-05-22 DIAGNOSIS — I4891 Unspecified atrial fibrillation: Secondary | ICD-10-CM | POA: Diagnosis not present

## 2014-05-22 DIAGNOSIS — J449 Chronic obstructive pulmonary disease, unspecified: Secondary | ICD-10-CM | POA: Insufficient documentation

## 2014-05-22 DIAGNOSIS — I10 Essential (primary) hypertension: Secondary | ICD-10-CM | POA: Diagnosis not present

## 2014-05-23 ENCOUNTER — Ambulatory Visit: Payer: Medicare Other | Admitting: Cardiology

## 2014-05-23 NOTE — Telephone Encounter (Signed)
Was going to return the call of the daughter but mother is deceased now.

## 2014-05-26 ENCOUNTER — Telehealth: Payer: Self-pay | Admitting: Hematology

## 2014-05-26 NOTE — Telephone Encounter (Signed)
Husband call to confirm all appt has been canceled

## 2014-05-27 ENCOUNTER — Ambulatory Visit: Payer: Medicare Other | Admitting: Cardiology

## 2014-05-27 ENCOUNTER — Encounter: Payer: Self-pay | Admitting: Internal Medicine

## 2014-06-02 ENCOUNTER — Ambulatory Visit: Payer: Medicare Other

## 2014-06-02 ENCOUNTER — Other Ambulatory Visit: Payer: Medicare Other

## 2014-06-03 DIAGNOSIS — 419620001 Death: Secondary | SNOMED CT

## 2014-06-03 NOTE — ED Notes (Signed)
Jewelry was given to husband of deceased.  Three gold colored bracelets remain on patient due to inability to remove.

## 2014-06-03 NOTE — Progress Notes (Signed)
Called to ed post cpr. Met pt's husband, son, and family friend in waiting room and escorted to consult room. Accompanied Dr. Winfred Leeds who explained to family pt passed. Husband was tearful and in disbelief. Son was very tearful at bedside. After staff located pt's belongings (jewelry) I escorted family to ed exit. Family was very grateful for support and presence. Ernest Haber Chaplain   2014/05/30 0300  Clinical Encounter Type  Visited With Family

## 2014-06-03 NOTE — ED Notes (Signed)
Per [facility] - pt from home w/ a witnessed cardiac arrest - pt w/ down time of 9mins, EMS reports 19min pta they may have palpated a faint pulse - pt does have a pacemaker and pt noted to have pace spike however no cardiac electrical activity noted on EKG. Pt given x11 epi, x1 amp D50 and 1568ml of cold saline en route. Pt arrived to room at [place].

## 2014-06-03 NOTE — ED Provider Notes (Signed)
CSN: [id number]     Arrival date & time 06-14-2014  0152 History   First MD Initiated Contact with Patient June 14, 2014 0204     No chief complaint on file.  Level V caveat cardiac arrest urgency of situation  (Consider location/radiation/quality/duration/timing/severity/associated sxs/prior Treatment) HPI EMS called at 0046  cardiac arrest., witnessed, CPR performed by husband. EMS arrived to find patient unresponsive pulseless with cardiac rhythm showing pacer spikes only. EMS placed patient on [place] device. Orally intubated patient, CBG was 110. Administered D50 1 amp by intraosseous line, epinephrine 11 mg by intraosseous line, EMS reports briefly regain pulses but lost pulses at 1:25 AM. Patient was pulseless for 30 minutes upon arrival here. Arrived here with paced cardiac rhythm. No peripheral pulses Past Medical History  Diagnosis Date  . Coronary artery disease     a. Severe two vessel; s/p CABG (LIMA-LAD, SVG-diag, SVG-PDA);  b. 05/2012 NSTEMI in setting of rapid aflutter;  c. 06/2012 Lexiscan cardiolite: small, mild reversible defect in lateral wall->mild ischemia, low-risk->med Rx.  Marland Kitchen Hypertension   . Type II diabetes mellitus   . Hyperlipidemia   . Diabetic neuropathy   . CVA (cerebral vascular accident) 2004  . Hypothyroidism   . Chronic anemia   . GERD (gastroesophageal reflux disease)   . Arthritis   . Memory loss, short term   . COPD (chronic obstructive pulmonary disease)   . Blood transfusion without reported diagnosis   . Chronic combined systolic and diastolic CHF (congestive heart failure)     a. 07/2012 Echo: EF 30%, Gr II DD, Mild AS/MR, Mod-Sev TR, mild bi-atrial and RV dil, PASP 108mmHg.  . Moderate mitral regurgitation     a. mod by TEE 2012, mild by echo 2013, 07/2012  . Severe tricuspid regurgitation     a. Severe by TEE 2012, mod by echo 2013, mod-sev by echo 07/2012  . Chronic vulvitis 07/1993  . Idiopathic anemia 2006  . Atrial fibrillation     a. Failed  amiodarone (prolonged QT), not a candidate for class IC meds due to her CAD. No Multaq due to CHF hx;  b. 07/2012 s/p SJM Anthem RF Bi-V PPM, ser # [id number] and AV node RFCA;  c. chronic coumadin.  . Atrial flutter   . Pacemaker   . GI bleed     a. 10/2011 - Dr Benson Norway did scope which showed hiatal hernia, colon revealed diverticula (diverticular bleed), internal/ext hemorrhoids.  . GI bleed 02/25/2014    REQUIRING TRANSFUSIONS   Past Surgical History  Procedure Laterality Date  . Coronary artery bypass graft  06/2009    X3, LIMA to LAD, SVG to diagonal, SVG to the posterior descending artery.  . Tonsillectomy    . Knee arthroscopy  08/2001  . Transthoracic echocardiogram  11/12/2010     Ejection fraction felt to be around 50%.  Wall thickness was increased in a pattern of mild LVH.  Moderately dilated left atrium.  Mildly dilated right atrium. Right ventricle was mildly dilated with mildly reduced systolic function  . Cardiac catheterization  06/09/2009    inferior wall hypokinesia with ejection fraction of 55%.  . Abdominal hysterectomy    . Joint replacement    . Tee without cardioversion  05/23/2011    Procedure: TRANSESOPHAGEAL ECHOCARDIOGRAM (TEE);  Surgeon: Lelon Perla, MD;  Location: [facility];  Service: Cardiovascular;  Laterality: N/A;  . Incision and drainage abscess  05/07/2012    Procedure: INCISION AND DRAINAGE ABSCESS;  Surgeon: Adin Hector, MD;  Location: [facility];  Service: General;  Laterality: N/A;  Groin wound  . Total abdominal hysterectomy  1979  . Breast biopsy  Left  . Bilateral salpingectomy  10/2000  . Breast biopsy  09/18/2009    fibrocystic change  . Coronary artery bypass graft  06/2009  . Pacemaker insertion  08/2012  . Colonoscopy N/A 01/19/2013    Procedure: COLONOSCOPY;  Surgeon: Inda Castle, MD;  Location: [facility];  Service: Endoscopy;  Laterality: N/A;  . Esophagogastroduodenoscopy N/A 01/19/2013    Procedure: ESOPHAGOGASTRODUODENOSCOPY (EGD);   Surgeon: Inda Castle, MD;  Location: [facility];  Service: Endoscopy;  Laterality: N/A;   Family History  Problem Relation Age of Onset  . Colon cancer Mother   . Stroke Mother   . Hypertension Mother   . Breast cancer Mother   . Colon cancer Father   . Heart attack Father     x2  . Renal Disease Sister     dialysis-twin sister  . Renal Disease Sister     renal bypass   History  Substance Use Topics  . Smoking status: Former Smoker    Types: Cigarettes  . Smokeless tobacco: Never Used  . Alcohol Use: No   OB History    Gravida Para Term Preterm AB TAB SAB Ectopic Multiple Living   2 2 2       2      Review of Systems  Unable to perform ROS cardiac arrest    Allergies  Penicillins; Tape; Betadine; Capoten; Codone; Methadone; and Propoxyphene  Home Medications   Prior to Admission medications   Medication Sig Start Date End Date Taking? Authorizing Provider  acetaminophen (TYLENOL) 325 MG tablet Take 2 tablets (650 mg total) by mouth every 6 (six) hours as needed for mild pain (or Fever >/= 101). 02/27/14   [name], MD  ALPRAZolam Duanne Moron) 0.25 MG tablet Take 0.25 mg by mouth at bedtime as needed for sleep.  04/30/14   [name], MD  amiodarone (PACERONE) 200 MG tablet Take 100 mg by mouth daily.    Historical Provider, MD  atorvastatin (LIPITOR) 40 MG tablet Take 40 mg by mouth every evening.     Historical Provider, MD  diltiazem (CARDIZEM CD) 240 MG 24 hr capsule Take 1 capsule (240 mg total) by mouth daily. 03/04/14   Peter M Martinique, MD  donepezil (ARICEPT) 10 MG tablet Take 10 mg by mouth at bedtime.    Historical Provider, MD  esomeprazole (NEXIUM) 40 MG capsule Take 40 mg by mouth daily before breakfast.      Historical Provider, MD  ezetimibe (ZETIA) 10 MG tablet Take 10 mg by mouth every evening.     Historical Provider, MD  furosemide (LASIX) 20 MG tablet Take by mouth 2 (two) times daily. 80 mg in the am and 60 mg in the pm 01/22/14   Eileen Stanford, PA-C  HUMALOG KWIKPEN 100 UNIT/ML KiwkPen Inject 20-70 Units into the skin 3 (three) times daily as needed (high blood sugar). Home sliding scale If blood sugar is <200 take 20 units If blood sugar is >200 take 25 units If blood sugar is less than 70, don't take any 05/06/14   Historical Provider, MD  insulin glargine (LANTUS) 100 UNIT/ML injection Inject 50 Units into the skin 2 (two) times daily.     Historical Provider, MD  insulin lispro (HUMALOG) 100 UNIT/ML injection Inject 20-25 Units into the skin 2 (two) times daily as needed for  high blood sugar. Home sliding scale If blood sugar is <200 take 20 units If blood sugar is >200 take 25 units If blood sugar is less than 70, don't take any    Historical Provider, MD  Ipratropium-Albuterol (COMBIVENT) 20-100 MCG/ACT AERS respimat Inhale 1 puff into the lungs 2 (two) times daily. 05/05/14   Aasim Marla Roe, MD  levofloxacin (LEVAQUIN) 500 MG tablet Take 1 tablet (500 mg total) by mouth daily. X 5 days Patient not taking: Reported on 05/21/2014 05/05/14   Aasim Marla Roe, MD  levothyroxine (SYNTHROID, LEVOTHROID) 100 MCG tablet Take 100 mcg by mouth every morning.     Historical Provider, MD  metoprolol succinate (TOPROL-XL) 50 MG 24 hr tablet Take 1 tablet (50 mg total) by mouth daily. Take with or immediately following a meal. 05/19/14   Peter M Martinique, MD  Multiple Vitamins-Minerals (ICAPS AREDS FORMULA PO) Take 1 tablet by mouth 2 (two) times daily.    Historical Provider, MD  nitroGLYCERIN (NITROSTAT) 0.4 MG SL tablet Place 0.4 mg under the tongue every 5 (five) minutes as needed. For chest pain    Historical Provider, MD  nortriptyline (PAMELOR) 75 MG capsule Take 1 capsule (75 mg total) by mouth at bedtime. 02/27/14   [name], MD  potassium chloride SA (K-DUR,KLOR-CON) 20 MEQ tablet Take 2 tablets (40 mEq total) by mouth 2 (two) times daily. 01/22/14   Eileen Stanford, PA-C  traZODone (DESYREL) 100 MG tablet Take 100  mg by mouth at bedtime.    Historical Provider, MD  warfarin (COUMADIN) 5 MG tablet Take 2.5-5 mg by mouth daily.     Historical Provider, MD   There were no vitals taken for this visit. Physical Exam  Constitutional:  Chronically ill-appearingpulse 0, occasional agonal respiration, blood pressure 0  HENT:  Head: Normocephalic and atraumatic.  Orally intubated  Eyes:  Pupils fixed and dilated  Neck: No tracheal deviation present.  Cardiovascular:  Heart sounds absent peripheral pulses absent  Pulmonary/Chest:  Occasional agonal respiration, 2 or 3 per minute  Abdominal:  Obese no signs of trauma  Musculoskeletal: She exhibits no edema.  No signs of trauma  Neurological:  Glasgow Coma Score 3  Skin:  Cool and dry  Nursing note and vitals reviewed.   ED Course  Procedures (including critical care time) Labs Review Labs Reviewed - No data to display  Imaging Review Dg Chest Port 1 View  05/21/2014   CLINICAL DATA:  Shortness of breath.  CHF.  Lobe struck her.  EXAM: PORTABLE CHEST - 1 VIEW  COMPARISON:  05/05/2014  FINDINGS: Postoperative changes in the mediastinum. Cardiac pacemaker. Shallow inspiration with cardiac enlargement. No pulmonary vascular congestion. No significant airspace disease in the lungs. No blunting of costophrenic angles. No pneumothorax.  IMPRESSION: Cardiac enlargement.  No evidence of active pulmonary disease.   Electronically Signed   By: Lucienne Capers M.D.   On: 05/21/2014 06:44     EKG Interpretation None     Ultrasound placed on patient's heart there was agonal, minimal activity without peripheral pulses,  MDM  Patient pronounced dead by me 1:55 AM Final diagnoses:  None  patient has been notified. Spoke with Dr. Odette Fraction. Dr. Shon Baton will sign death certificate Diagnosis dead on arrival     Orlie Dakin, MD 06-18-14 [phone]

## 2014-06-03 NOTE — ED Notes (Signed)
On arrival to department pt w/o a pulse - pacer spikes noted on EKG however no formed electrical cardiac activity noted. Dr. Winfred Leeds, EDP examined cardiac activity w/ bedside ultrasound - no cardiac movement noted on bedside ultrasound.

## 2014-06-03 DEATH — deceased

## 2014-06-12 ENCOUNTER — Encounter (HOSPITAL_COMMUNITY): Payer: Self-pay | Admitting: Internal Medicine

## 2014-06-16 ENCOUNTER — Other Ambulatory Visit: Payer: Medicare Other

## 2014-06-16 ENCOUNTER — Ambulatory Visit: Payer: Medicare Other

## 2014-06-30 ENCOUNTER — Other Ambulatory Visit: Payer: Medicare Other

## 2014-06-30 ENCOUNTER — Ambulatory Visit: Payer: Medicare Other

## 2014-07-02 ENCOUNTER — Ambulatory Visit: Payer: Medicare Other | Admitting: Cardiology

## 2014-07-07 ENCOUNTER — Ambulatory Visit: Payer: 59 | Admitting: Podiatry

## 2014-07-14 ENCOUNTER — Ambulatory Visit: Payer: Medicare Other

## 2014-07-14 ENCOUNTER — Other Ambulatory Visit: Payer: Medicare Other

## 2014-07-17 ENCOUNTER — Encounter (HOSPITAL_COMMUNITY): Payer: Self-pay | Admitting: Internal Medicine

## 2014-07-28 ENCOUNTER — Other Ambulatory Visit: Payer: Medicare Other

## 2014-07-28 ENCOUNTER — Ambulatory Visit: Payer: Medicare Other

## 2014-08-11 ENCOUNTER — Other Ambulatory Visit: Payer: Medicare Other

## 2014-08-11 ENCOUNTER — Ambulatory Visit: Payer: Medicare Other

## 2014-08-25 ENCOUNTER — Other Ambulatory Visit: Payer: Medicare Other

## 2014-08-25 ENCOUNTER — Ambulatory Visit: Payer: Medicare Other

## 2014-08-25 ENCOUNTER — Ambulatory Visit: Payer: Medicare Other | Admitting: Hematology

## 2015-03-22 IMAGING — US US ABDOMEN COMPLETE
1 series · 13 of 25 positions shown · non-contrast
Comparison: None.

CLINICAL DATA: Palpable fullness right abdomen

EXAM:
ULTRASOUND ABDOMEN COMPLETE

[Series 1: us abdomen complete · 0.24mm/px · 13 of 92 slices shown]
[im 1/92]
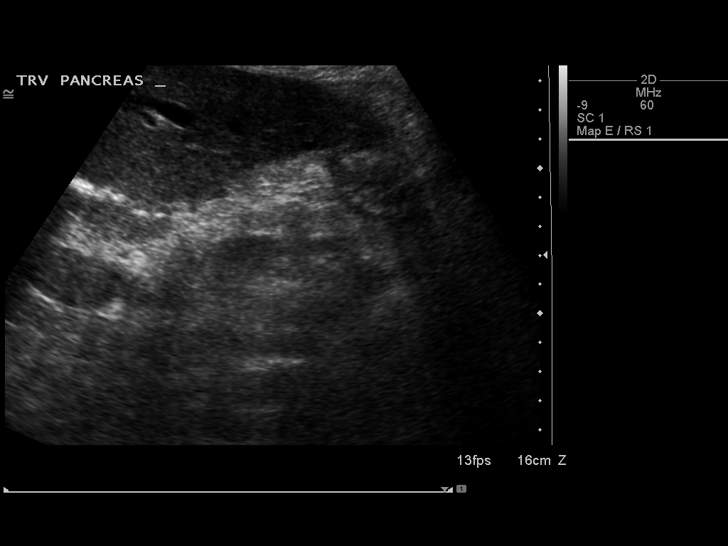
[im 8/92]
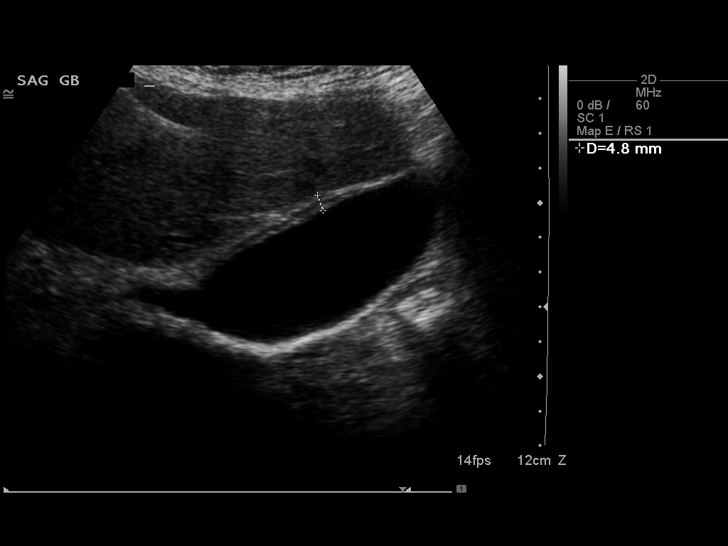
[im 16/92]
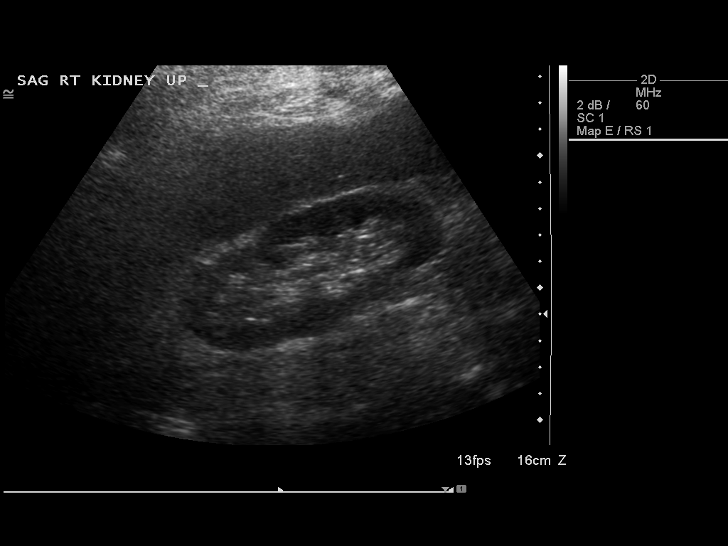
[im 23/92]
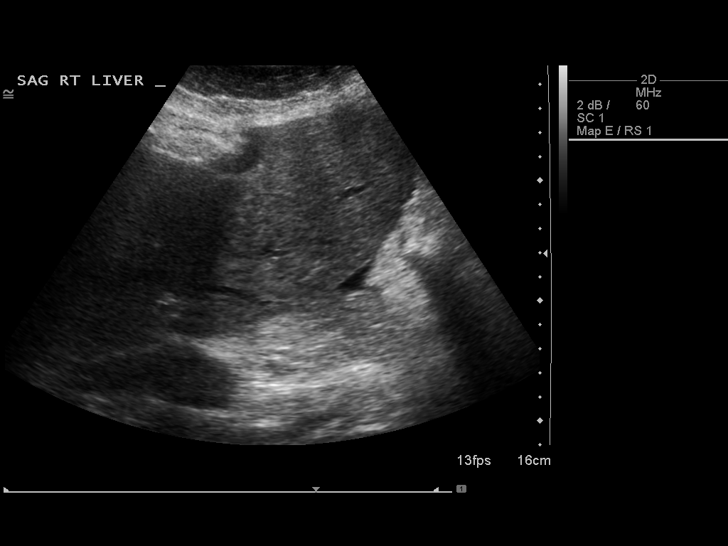
[im 31/92]
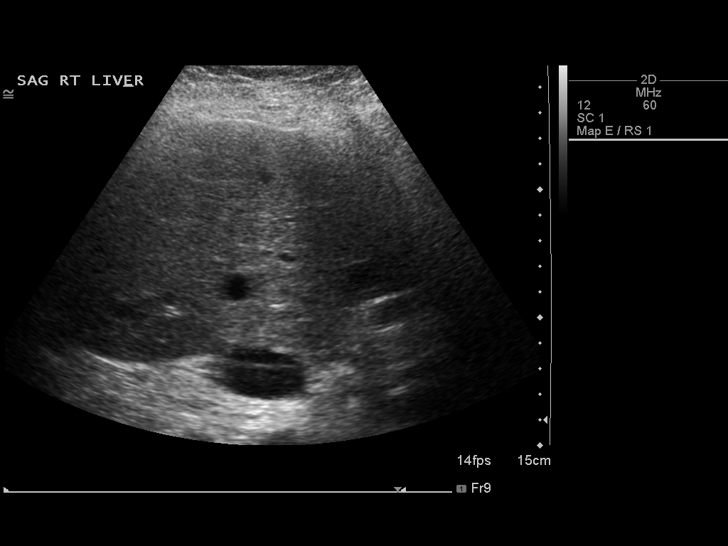
[im 38/92]
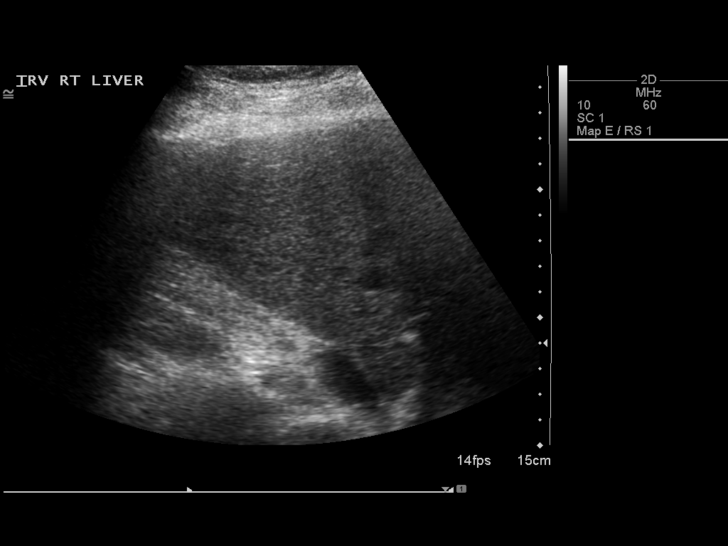
[im 46/92]
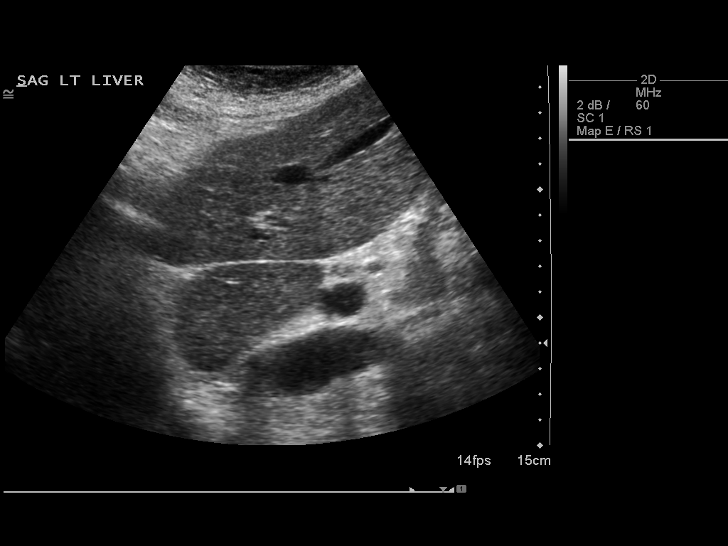
[im 54/92]
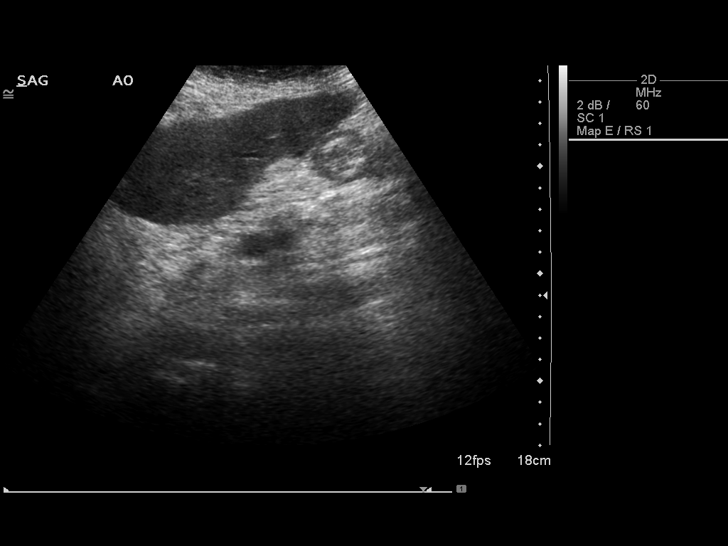
[im 61/92]
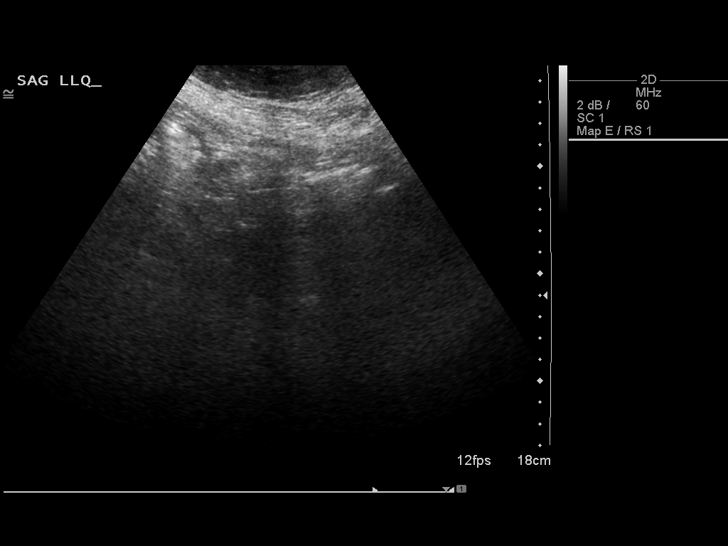
[im 69/92]
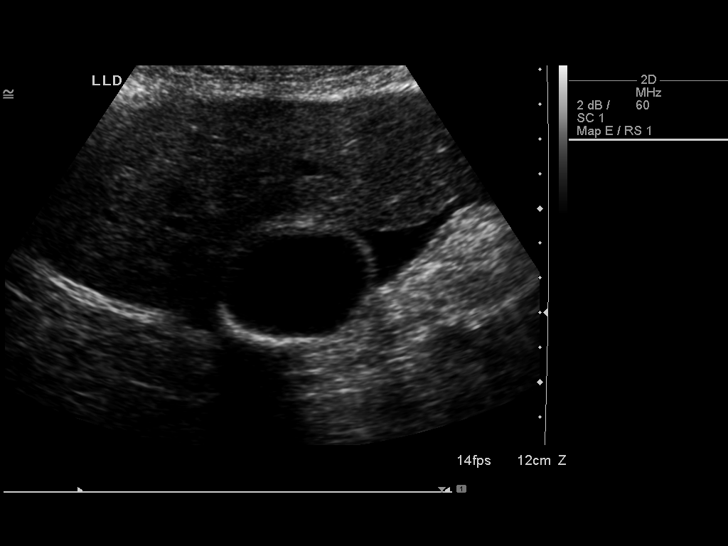
[im 76/92]
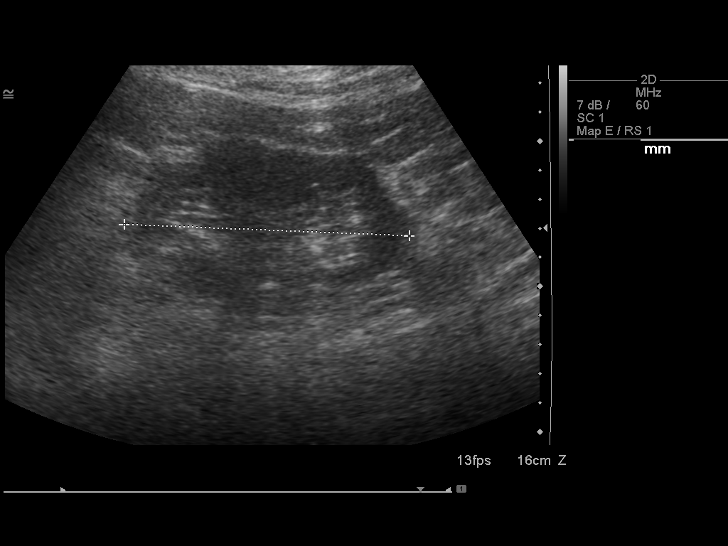
[im 84/92]
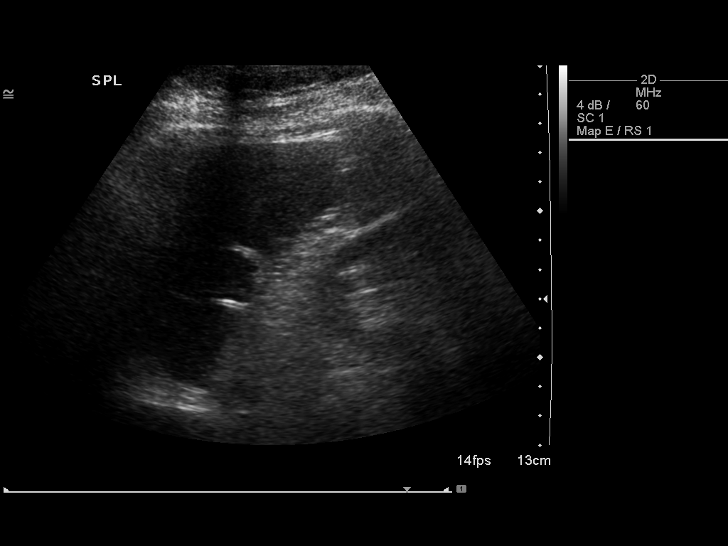
[im 92/92]
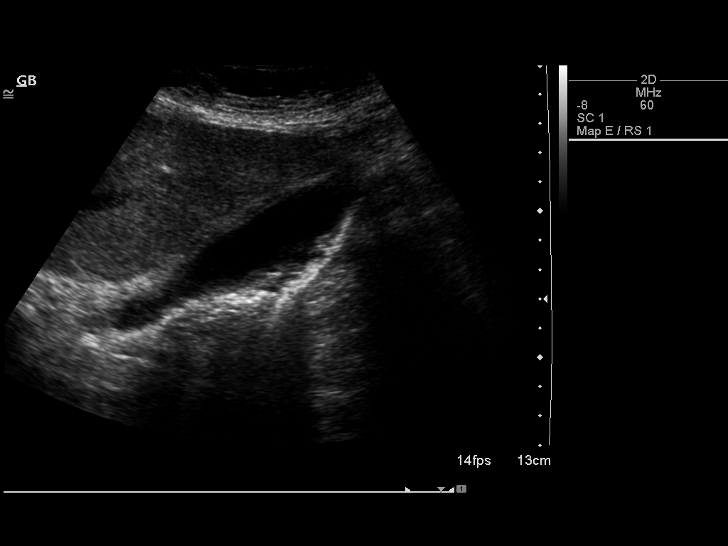

[13 of 25 positions shown; findings below may reference images not displayed]

FINDINGS: Gallbladder: Within the gallbladder, there are echogenic foci which
move and shadow consistent with gallstones. Largest gallstone
measures 6 mm in length. There is also sludge in the gallbladder.
The gallbladder wall is thickened and slightly edematous. No
sonographic Murphy sign noted.

Common bile duct: Diameter: 6 mm. There is no demonstrable
intrahepatic, common hepatic, or common bile duct dilatation.

Liver: No focal lesion identified. Liver is enlarged, measuring
slightly over 22 cm in length. Liver echogenicity overall is
increased.

IVC: No abnormality visualized.

Pancreas: Visualized portion unremarkable. Portions of pancreas are
obscured by gas.

Spleen: Size and appearance within normal limits.

Right Kidney: Length: 10.3 cm. Echogenicity within normal limits. No
mass or hydronephrosis visualized.

Left Kidney: Length: 9.9 cm. Echogenicity within normal limits. No
mass or hydronephrosis visualized.

Abdominal aorta: No aneurysm visualized.

Other findings: There is a small amount of ascites along the
inferior aspect of the liver.
IMPRESSION: The appearance of the gallbladder raises question of a degree of
cholecystitis. Note that the patient does have slight ascites which
can cause gallbladder wall thickening. The edematous appearance of
the gallbladder, however, raises concern for underlying
cholecystitis. Sludge and gallstones are noted within the
gallbladder.

Liver is enlarged with increased echogenicity, most likely due to
hepatic steatosis. While no focal liver lesions are identified, it
must be cautioned that the sensitivity of ultrasound for focal liver
lesions is diminished in this circumstance.

Slight ascites.

Portions of pancreas obscured by gas. Visualized portions of
pancreas appear within normal limits.

These results will be called to the ordering clinician or
representative by the Radiologist Assistant, and communication
documented in the PACS or zVision Dashboard.

## 2015-04-10 IMAGING — CR DG CHEST 1V PORT
1 series · 1 of 1 positions shown · non-contrast
Comparison: 05/05/2014

CLINICAL DATA: Shortness of breath.  CHF.  Lobe struck her.

EXAM:
PORTABLE CHEST - 1 VIEW

[AP]
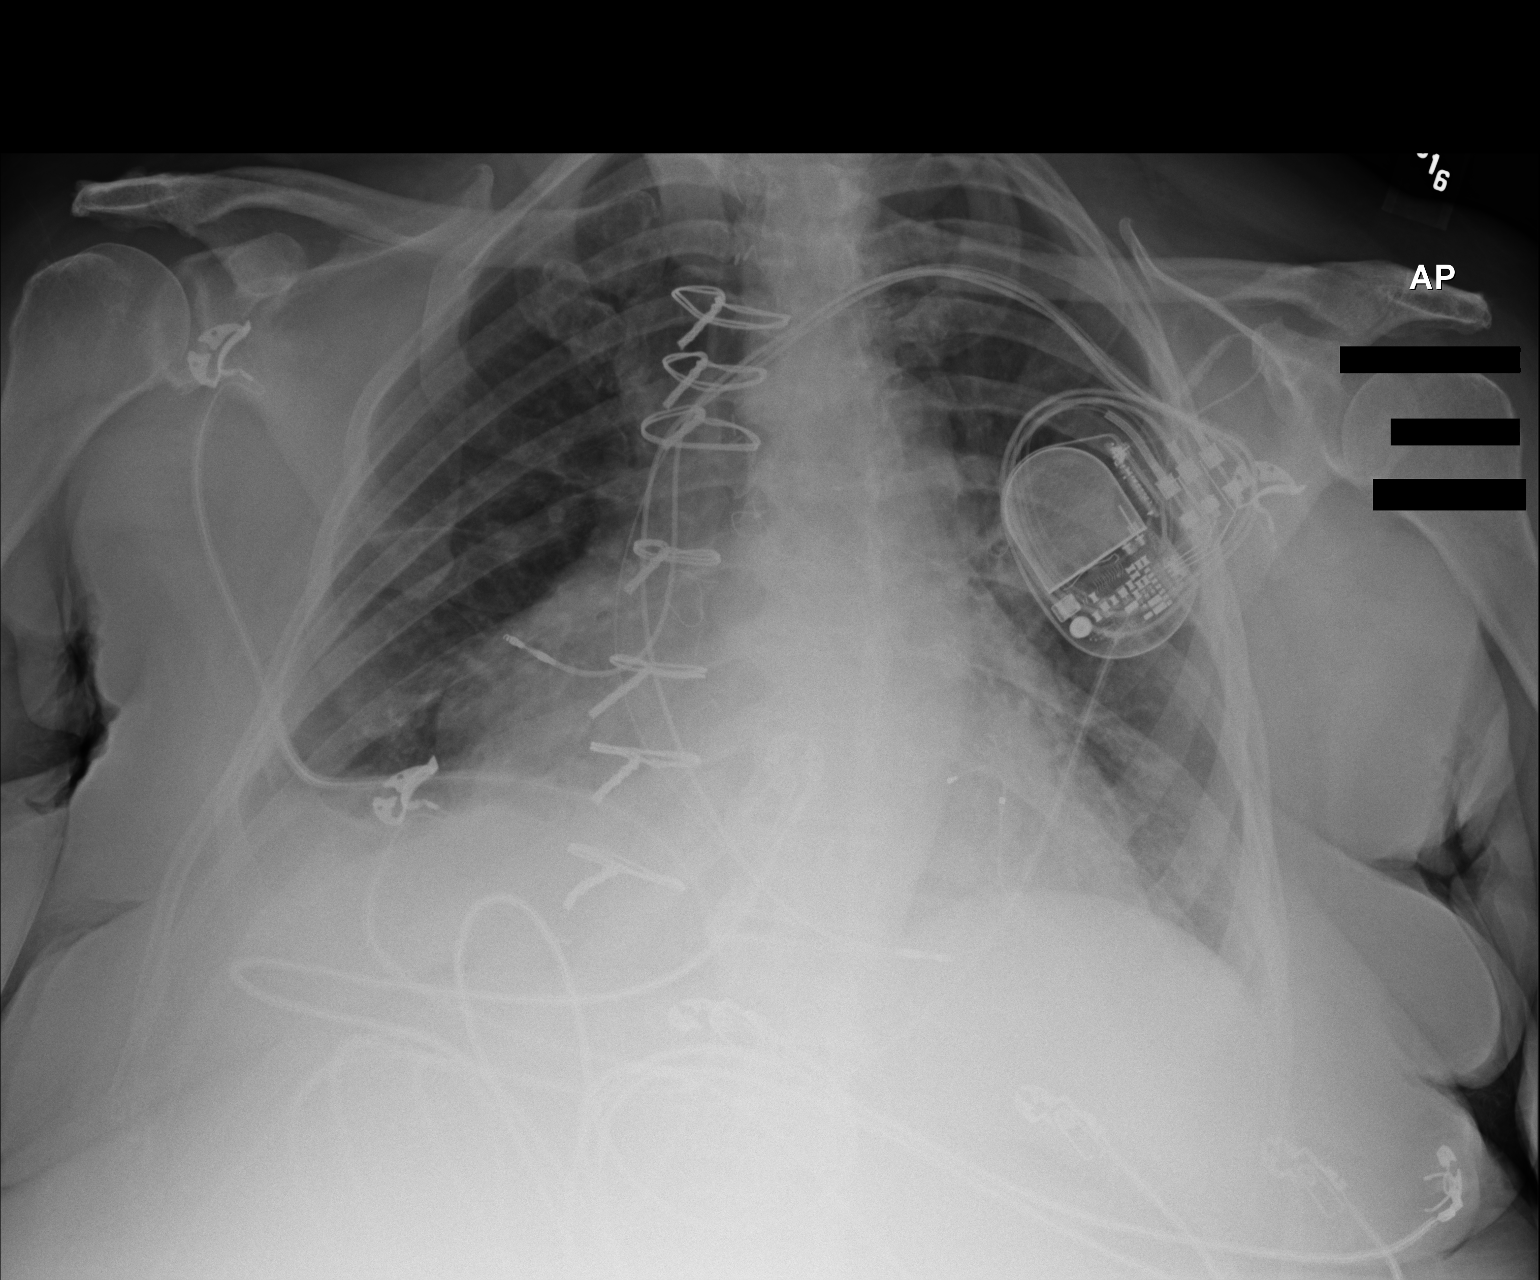

[1 of 1 positions shown; findings below may reference images not displayed]

FINDINGS: Postoperative changes in the mediastinum. Cardiac pacemaker. Shallow
inspiration with cardiac enlargement. No pulmonary vascular
congestion. No significant airspace disease in the lungs. No
blunting of costophrenic angles. No pneumothorax.
IMPRESSION: Cardiac enlargement.  No evidence of active pulmonary disease.

## 2015-12-12 ENCOUNTER — Other Ambulatory Visit: Payer: Self-pay | Admitting: Nurse Practitioner
